# Patient Record
Sex: Female | Born: 1935 | Race: Black or African American | Hispanic: No | Smoking: Never smoker
Health system: Southern US, Community
[De-identification: ages and names within clinical notes are randomized; demographics above are authoritative.]

## PROBLEM LIST (undated history)

## (undated) DIAGNOSIS — E7800 Pure hypercholesterolemia, unspecified: Secondary | ICD-10-CM

## (undated) DIAGNOSIS — R945 Abnormal results of liver function studies: Secondary | ICD-10-CM

## (undated) DIAGNOSIS — G629 Polyneuropathy, unspecified: Secondary | ICD-10-CM

## (undated) DIAGNOSIS — G729 Myopathy, unspecified: Secondary | ICD-10-CM

## (undated) DIAGNOSIS — R7989 Other specified abnormal findings of blood chemistry: Secondary | ICD-10-CM

## (undated) DIAGNOSIS — Z9889 Other specified postprocedural states: Secondary | ICD-10-CM

## (undated) DIAGNOSIS — M1990 Unspecified osteoarthritis, unspecified site: Secondary | ICD-10-CM

## (undated) DIAGNOSIS — M48061 Spinal stenosis, lumbar region without neurogenic claudication: Secondary | ICD-10-CM

## (undated) DIAGNOSIS — I10 Essential (primary) hypertension: Secondary | ICD-10-CM

## (undated) DIAGNOSIS — R112 Nausea with vomiting, unspecified: Secondary | ICD-10-CM

## (undated) DIAGNOSIS — C55 Malignant neoplasm of uterus, part unspecified: Secondary | ICD-10-CM

## (undated) HISTORY — DX: Malignant neoplasm of uterus, part unspecified: C55

## (undated) HISTORY — DX: Other specified abnormal findings of blood chemistry: R79.89

## (undated) HISTORY — DX: Myopathy, unspecified: G72.9

## (undated) HISTORY — DX: Essential (primary) hypertension: I10

## (undated) HISTORY — DX: Unspecified osteoarthritis, unspecified site: M19.90

## (undated) HISTORY — PX: TONSILLECTOMY: SUR1361

## (undated) HISTORY — DX: Spinal stenosis, lumbar region without neurogenic claudication: M48.061

## (undated) HISTORY — DX: Polyneuropathy, unspecified: G62.9

## (undated) HISTORY — DX: Abnormal results of liver function studies: R94.5

## (undated) HISTORY — DX: Pure hypercholesterolemia, unspecified: E78.00

---

## 1971-03-24 HISTORY — PX: CHOLECYSTECTOMY: SHX55

## 1971-03-24 HISTORY — PX: APPENDECTOMY: SHX54

## 2000-08-06 ENCOUNTER — Encounter: Payer: Self-pay | Admitting: Podiatry

## 2000-08-09 ENCOUNTER — Encounter: Payer: Self-pay | Admitting: Podiatry

## 2000-08-09 ENCOUNTER — Ambulatory Visit (HOSPITAL_COMMUNITY): Admission: RE | Admit: 2000-08-09 | Discharge: 2000-08-09 | Payer: Self-pay | Admitting: Podiatry

## 2000-11-11 ENCOUNTER — Ambulatory Visit (HOSPITAL_COMMUNITY): Admission: RE | Admit: 2000-11-11 | Discharge: 2000-11-11 | Payer: Self-pay | Admitting: Family Medicine

## 2000-11-11 ENCOUNTER — Encounter: Payer: Self-pay | Admitting: Family Medicine

## 2000-12-08 ENCOUNTER — Other Ambulatory Visit: Admission: RE | Admit: 2000-12-08 | Discharge: 2000-12-08 | Payer: Self-pay | Admitting: Family Medicine

## 2000-12-20 ENCOUNTER — Ambulatory Visit (HOSPITAL_COMMUNITY): Admission: RE | Admit: 2000-12-20 | Discharge: 2000-12-20 | Payer: Self-pay | Admitting: Podiatry

## 2001-05-26 ENCOUNTER — Encounter: Payer: Self-pay | Admitting: Family Medicine

## 2001-05-26 ENCOUNTER — Ambulatory Visit (HOSPITAL_COMMUNITY): Admission: RE | Admit: 2001-05-26 | Discharge: 2001-05-26 | Payer: Self-pay | Admitting: Family Medicine

## 2001-08-31 ENCOUNTER — Ambulatory Visit (HOSPITAL_COMMUNITY): Admission: RE | Admit: 2001-08-31 | Discharge: 2001-08-31 | Payer: Self-pay | Admitting: General Surgery

## 2002-09-19 ENCOUNTER — Ambulatory Visit (HOSPITAL_COMMUNITY): Admission: RE | Admit: 2002-09-19 | Discharge: 2002-09-19 | Payer: Self-pay | Admitting: Family Medicine

## 2002-09-19 ENCOUNTER — Encounter: Payer: Self-pay | Admitting: Family Medicine

## 2003-08-21 ENCOUNTER — Ambulatory Visit (HOSPITAL_COMMUNITY): Admission: RE | Admit: 2003-08-21 | Discharge: 2003-08-21 | Payer: Self-pay | Admitting: General Surgery

## 2003-10-30 ENCOUNTER — Ambulatory Visit (HOSPITAL_COMMUNITY): Admission: RE | Admit: 2003-10-30 | Discharge: 2003-10-30 | Payer: Self-pay | Admitting: Family Medicine

## 2003-11-13 ENCOUNTER — Ambulatory Visit (HOSPITAL_COMMUNITY): Admission: RE | Admit: 2003-11-13 | Discharge: 2003-11-13 | Payer: Self-pay | Admitting: Family Medicine

## 2005-02-02 ENCOUNTER — Ambulatory Visit (HOSPITAL_COMMUNITY): Admission: RE | Admit: 2005-02-02 | Discharge: 2005-02-02 | Payer: Self-pay | Admitting: Family Medicine

## 2005-03-23 HISTORY — PX: FOOT SURGERY: SHX648

## 2005-12-15 ENCOUNTER — Ambulatory Visit: Payer: Self-pay | Admitting: Family Medicine

## 2005-12-18 ENCOUNTER — Ambulatory Visit (HOSPITAL_COMMUNITY): Admission: RE | Admit: 2005-12-18 | Discharge: 2005-12-18 | Payer: Self-pay | Admitting: Family Medicine

## 2005-12-19 ENCOUNTER — Encounter: Admission: RE | Admit: 2005-12-19 | Discharge: 2005-12-19 | Payer: Self-pay | Admitting: Family Medicine

## 2006-02-08 ENCOUNTER — Ambulatory Visit (HOSPITAL_COMMUNITY): Admission: RE | Admit: 2006-02-08 | Discharge: 2006-02-08 | Payer: Self-pay | Admitting: Family Medicine

## 2006-03-22 ENCOUNTER — Ambulatory Visit: Payer: Self-pay | Admitting: Family Medicine

## 2006-03-22 ENCOUNTER — Encounter: Payer: Self-pay | Admitting: Family Medicine

## 2006-03-22 ENCOUNTER — Other Ambulatory Visit: Admission: RE | Admit: 2006-03-22 | Discharge: 2006-03-22 | Payer: Self-pay | Admitting: Family Medicine

## 2006-03-23 HISTORY — PX: ABDOMINAL HYSTERECTOMY: SHX81

## 2006-08-20 ENCOUNTER — Encounter: Payer: Self-pay | Admitting: Family Medicine

## 2006-08-20 LAB — CONVERTED CEMR LAB
Albumin: 4.1 g/dL (ref 3.5–5.2)
Bilirubin, Direct: 0.1 mg/dL (ref 0.0–0.3)
CO2: 26 meq/L (ref 19–32)
Cholesterol: 254 mg/dL — ABNORMAL HIGH (ref 0–200)
Creatinine, Ser: 0.84 mg/dL (ref 0.40–1.20)
HDL: 77 mg/dL (ref >39)
Indirect Bilirubin: 0.7 mg/dL (ref 0.0–0.9)
LDL Cholesterol: 162 mg/dL — ABNORMAL HIGH (ref 0–99)
Potassium: 3.9 meq/L (ref 3.5–5.3)
Total Bilirubin: 0.8 mg/dL (ref 0.3–1.2)
Total Protein: 7 g/dL (ref 6.0–8.3)
Triglycerides: 73 mg/dL (ref <150)

## 2006-08-22 DIAGNOSIS — C55 Malignant neoplasm of uterus, part unspecified: Secondary | ICD-10-CM

## 2006-08-22 HISTORY — DX: Malignant neoplasm of uterus, part unspecified: C55

## 2006-08-23 ENCOUNTER — Encounter: Payer: Self-pay | Admitting: Family Medicine

## 2006-08-23 LAB — CONVERTED CEMR LAB
HCV Ab: NEGATIVE
Hep B C IgM: NEGATIVE
Hepatitis B Surface Ag: NEGATIVE

## 2006-08-31 ENCOUNTER — Ambulatory Visit: Payer: Self-pay | Admitting: Family Medicine

## 2006-09-06 ENCOUNTER — Ambulatory Visit (HOSPITAL_COMMUNITY): Admission: RE | Admit: 2006-09-06 | Discharge: 2006-09-06 | Payer: Self-pay | Admitting: Family Medicine

## 2006-10-04 ENCOUNTER — Encounter: Payer: Self-pay | Admitting: Family Medicine

## 2006-10-04 LAB — CONVERTED CEMR LAB
AST: 59 units/L — ABNORMAL HIGH (ref 0–37)
Alkaline Phosphatase: 48 units/L (ref 39–117)
Bilirubin, Direct: 0.1 mg/dL (ref 0.0–0.3)
Indirect Bilirubin: 0.5 mg/dL (ref 0.0–0.9)
Total Bilirubin: 0.6 mg/dL (ref 0.3–1.2)

## 2006-10-07 ENCOUNTER — Ambulatory Visit (HOSPITAL_COMMUNITY): Admission: RE | Admit: 2006-10-07 | Discharge: 2006-10-07 | Payer: Self-pay | Admitting: Family Medicine

## 2006-10-15 ENCOUNTER — Ambulatory Visit: Payer: Self-pay | Admitting: Gastroenterology

## 2006-10-21 ENCOUNTER — Ambulatory Visit (HOSPITAL_COMMUNITY): Admission: RE | Admit: 2006-10-21 | Discharge: 2006-10-21 | Payer: Self-pay | Admitting: Family Medicine

## 2006-10-28 ENCOUNTER — Encounter: Admission: RE | Admit: 2006-10-28 | Discharge: 2006-10-28 | Payer: Self-pay | Admitting: Family Medicine

## 2006-11-10 ENCOUNTER — Ambulatory Visit: Admission: RE | Admit: 2006-11-10 | Discharge: 2006-11-10 | Payer: Self-pay | Admitting: Gynecology

## 2006-12-14 ENCOUNTER — Ambulatory Visit (HOSPITAL_COMMUNITY): Admission: RE | Admit: 2006-12-14 | Discharge: 2006-12-15 | Payer: Self-pay | Admitting: Obstetrics & Gynecology

## 2006-12-14 ENCOUNTER — Encounter: Payer: Self-pay | Admitting: Gynecologic Oncology

## 2007-01-05 ENCOUNTER — Ambulatory Visit: Admission: RE | Admit: 2007-01-05 | Discharge: 2007-01-05 | Payer: Self-pay | Admitting: Gynecologic Oncology

## 2007-01-21 ENCOUNTER — Ambulatory Visit: Payer: Self-pay | Admitting: Family Medicine

## 2007-01-31 ENCOUNTER — Encounter: Admission: RE | Admit: 2007-01-31 | Discharge: 2007-01-31 | Payer: Self-pay | Admitting: Internal Medicine

## 2007-02-24 ENCOUNTER — Ambulatory Visit (HOSPITAL_COMMUNITY): Admission: RE | Admit: 2007-02-24 | Discharge: 2007-02-24 | Payer: Self-pay | Admitting: Family Medicine

## 2007-03-24 ENCOUNTER — Encounter: Payer: Self-pay | Admitting: Family Medicine

## 2007-05-04 ENCOUNTER — Ambulatory Visit: Admission: RE | Admit: 2007-05-04 | Discharge: 2007-05-04 | Payer: Self-pay | Admitting: Gynecologic Oncology

## 2007-05-04 ENCOUNTER — Encounter: Payer: Self-pay | Admitting: Family Medicine

## 2007-05-04 ENCOUNTER — Encounter: Payer: Self-pay | Admitting: Gynecologic Oncology

## 2007-05-04 ENCOUNTER — Other Ambulatory Visit: Admission: RE | Admit: 2007-05-04 | Discharge: 2007-05-04 | Payer: Self-pay | Admitting: Gynecologic Oncology

## 2007-05-31 ENCOUNTER — Ambulatory Visit: Payer: Self-pay | Admitting: Family Medicine

## 2007-06-02 ENCOUNTER — Encounter: Payer: Self-pay | Admitting: Family Medicine

## 2007-06-02 LAB — CONVERTED CEMR LAB
Alkaline Phosphatase: 57 units/L (ref 39–117)
Basophils Absolute: 0 10*3/uL (ref 0.0–0.1)
Bilirubin, Direct: 0.2 mg/dL (ref 0.0–0.3)
CO2: 27 meq/L (ref 19–32)
Calcium: 9.4 mg/dL (ref 8.4–10.5)
Cholesterol: 236 mg/dL — ABNORMAL HIGH (ref 0–200)
Eosinophils Relative: 2 % (ref 0–5)
Glucose, Bld: 111 mg/dL — ABNORMAL HIGH (ref 70–99)
HCT: 39.6 % (ref 36.0–46.0)
HDL: 77 mg/dL (ref >39)
Hemoglobin: 13.2 g/dL (ref 12.0–15.0)
Indirect Bilirubin: 0.6 mg/dL (ref 0.0–0.9)
Lymphs Abs: 2.4 10*3/uL (ref 0.7–4.0)
Monocytes Absolute: 0.4 10*3/uL (ref 0.1–1.0)
Neutro Abs: 3.3 10*3/uL (ref 1.7–7.7)
Platelets: 176 10*3/uL (ref 150–400)
RBC: 4.35 M/uL (ref 3.87–5.11)
Total Bilirubin: 0.8 mg/dL (ref 0.3–1.2)
Triglycerides: 79 mg/dL (ref <150)
VLDL: 16 mg/dL (ref 0–40)

## 2007-06-03 ENCOUNTER — Encounter: Payer: Self-pay | Admitting: Family Medicine

## 2007-06-21 ENCOUNTER — Ambulatory Visit: Payer: Self-pay | Admitting: Internal Medicine

## 2007-06-21 ENCOUNTER — Ambulatory Visit (HOSPITAL_COMMUNITY): Admission: RE | Admit: 2007-06-21 | Discharge: 2007-06-21 | Payer: Self-pay | Admitting: Internal Medicine

## 2007-06-21 ENCOUNTER — Encounter: Payer: Self-pay | Admitting: Internal Medicine

## 2007-06-21 HISTORY — PX: COLONOSCOPY: SHX174

## 2007-08-31 ENCOUNTER — Ambulatory Visit: Payer: Self-pay | Admitting: Family Medicine

## 2007-08-31 ENCOUNTER — Other Ambulatory Visit: Admission: RE | Admit: 2007-08-31 | Discharge: 2007-08-31 | Payer: Self-pay | Admitting: Family Medicine

## 2007-08-31 ENCOUNTER — Encounter: Payer: Self-pay | Admitting: Family Medicine

## 2007-08-31 LAB — CONVERTED CEMR LAB
AST: 50 units/L — ABNORMAL HIGH (ref 0–37)
Albumin: 4.1 g/dL (ref 3.5–5.2)
Bilirubin, Direct: 0.1 mg/dL (ref 0.0–0.3)
CO2: 25 meq/L (ref 19–32)
Calcium: 9.7 mg/dL (ref 8.4–10.5)
Cholesterol: 241 mg/dL — ABNORMAL HIGH (ref 0–200)
Creatinine, Ser: 0.79 mg/dL (ref 0.40–1.20)
Indirect Bilirubin: 0.5 mg/dL (ref 0.0–0.9)
Potassium: 4.7 meq/L (ref 3.5–5.3)
Sodium: 143 meq/L (ref 135–145)
Total CHOL/HDL Ratio: 3.1
Total Protein: 7 g/dL (ref 6.0–8.3)
VLDL: 17 mg/dL (ref 0–40)

## 2007-09-08 ENCOUNTER — Encounter: Payer: Self-pay | Admitting: Family Medicine

## 2007-09-08 DIAGNOSIS — E785 Hyperlipidemia, unspecified: Secondary | ICD-10-CM | POA: Insufficient documentation

## 2007-09-08 DIAGNOSIS — I10 Essential (primary) hypertension: Secondary | ICD-10-CM

## 2007-10-07 DIAGNOSIS — Z8669 Personal history of other diseases of the nervous system and sense organs: Secondary | ICD-10-CM | POA: Insufficient documentation

## 2007-11-01 ENCOUNTER — Telehealth: Payer: Self-pay | Admitting: Family Medicine

## 2007-11-02 ENCOUNTER — Telehealth: Payer: Self-pay | Admitting: Family Medicine

## 2007-11-02 ENCOUNTER — Ambulatory Visit: Payer: Self-pay | Admitting: Family Medicine

## 2007-11-02 LAB — CONVERTED CEMR LAB
Bilirubin Urine: NEGATIVE
Blood in Urine, dipstick: NEGATIVE
Glucose, Urine, Semiquant: NEGATIVE
Ketones, urine, test strip: NEGATIVE
Protein, U semiquant: NEGATIVE
Specific Gravity, Urine: 1.01
pH: 6

## 2007-11-03 ENCOUNTER — Encounter: Payer: Self-pay | Admitting: Family Medicine

## 2008-01-11 ENCOUNTER — Other Ambulatory Visit: Admission: RE | Admit: 2008-01-11 | Discharge: 2008-01-11 | Payer: Self-pay | Admitting: Gynecologic Oncology

## 2008-01-11 ENCOUNTER — Encounter: Payer: Self-pay | Admitting: Gynecologic Oncology

## 2008-01-11 ENCOUNTER — Encounter: Payer: Self-pay | Admitting: Family Medicine

## 2008-01-11 ENCOUNTER — Ambulatory Visit: Admission: RE | Admit: 2008-01-11 | Discharge: 2008-01-11 | Payer: Self-pay | Admitting: Gynecologic Oncology

## 2008-01-17 ENCOUNTER — Ambulatory Visit (HOSPITAL_COMMUNITY): Admission: RE | Admit: 2008-01-17 | Discharge: 2008-01-17 | Payer: Self-pay | Admitting: Family Medicine

## 2008-01-17 ENCOUNTER — Ambulatory Visit: Payer: Self-pay | Admitting: Family Medicine

## 2008-01-17 ENCOUNTER — Encounter: Payer: Self-pay | Admitting: Orthopedic Surgery

## 2008-01-17 DIAGNOSIS — M549 Dorsalgia, unspecified: Secondary | ICD-10-CM | POA: Insufficient documentation

## 2008-01-17 DIAGNOSIS — C55 Malignant neoplasm of uterus, part unspecified: Secondary | ICD-10-CM

## 2008-01-18 ENCOUNTER — Encounter: Payer: Self-pay | Admitting: Family Medicine

## 2008-01-25 LAB — CONVERTED CEMR LAB
ALT: 48 units/L — ABNORMAL HIGH (ref 0–35)
Albumin: 3.9 g/dL (ref 3.5–5.2)
Alkaline Phosphatase: 49 units/L (ref 39–117)
BUN: 22 mg/dL (ref 6–23)
Bilirubin, Direct: 0.1 mg/dL (ref 0.0–0.3)
Cholesterol: 216 mg/dL — ABNORMAL HIGH (ref 0–200)
Glucose, Bld: 100 mg/dL — ABNORMAL HIGH (ref 70–99)
HDL: 70 mg/dL (ref >39)
Indirect Bilirubin: 0.6 mg/dL (ref 0.0–0.9)
Total CHOL/HDL Ratio: 3.1
Total Protein: 6.5 g/dL (ref 6.0–8.3)
Triglycerides: 108 mg/dL (ref <150)
VLDL: 22 mg/dL (ref 0–40)

## 2008-02-28 ENCOUNTER — Ambulatory Visit (HOSPITAL_COMMUNITY): Admission: RE | Admit: 2008-02-28 | Discharge: 2008-02-28 | Payer: Self-pay | Admitting: Family Medicine

## 2008-05-21 ENCOUNTER — Encounter: Payer: Self-pay | Admitting: Family Medicine

## 2008-06-12 ENCOUNTER — Encounter: Payer: Self-pay | Admitting: Family Medicine

## 2008-06-12 LAB — CONVERTED CEMR LAB
AST: 52 units/L — ABNORMAL HIGH (ref 0–37)
Alkaline Phosphatase: 56 units/L (ref 39–117)
Bilirubin, Direct: 0.1 mg/dL (ref 0.0–0.3)
HDL: 70 mg/dL (ref >39)
Indirect Bilirubin: 0.5 mg/dL (ref 0.0–0.9)
Total Bilirubin: 0.6 mg/dL (ref 0.3–1.2)
Total CHOL/HDL Ratio: 3.3
Total Protein: 7 g/dL (ref 6.0–8.3)

## 2008-06-21 ENCOUNTER — Ambulatory Visit: Payer: Self-pay | Admitting: Family Medicine

## 2008-06-21 ENCOUNTER — Other Ambulatory Visit: Admission: RE | Admit: 2008-06-21 | Discharge: 2008-06-21 | Payer: Self-pay | Admitting: Family Medicine

## 2008-06-21 ENCOUNTER — Encounter: Payer: Self-pay | Admitting: Family Medicine

## 2008-06-21 LAB — CONVERTED CEMR LAB: OCCULT 1: NEGATIVE

## 2008-06-23 DIAGNOSIS — M25569 Pain in unspecified knee: Secondary | ICD-10-CM | POA: Insufficient documentation

## 2008-06-28 ENCOUNTER — Telehealth: Payer: Self-pay | Admitting: Family Medicine

## 2008-08-14 ENCOUNTER — Telehealth: Payer: Self-pay | Admitting: Family Medicine

## 2009-01-04 ENCOUNTER — Encounter: Payer: Self-pay | Admitting: Family Medicine

## 2009-02-06 ENCOUNTER — Ambulatory Visit: Admission: RE | Admit: 2009-02-06 | Discharge: 2009-02-06 | Payer: Self-pay | Admitting: Gynecologic Oncology

## 2009-02-06 ENCOUNTER — Encounter: Payer: Self-pay | Admitting: Family Medicine

## 2009-02-06 ENCOUNTER — Other Ambulatory Visit: Admission: RE | Admit: 2009-02-06 | Discharge: 2009-02-06 | Payer: Self-pay | Admitting: Gynecologic Oncology

## 2009-02-06 LAB — CONVERTED CEMR LAB
BUN: 20 mg/dL (ref 6–23)
Basophils Relative: 0 % (ref 0–1)
CO2: 26 meq/L (ref 19–32)
Cholesterol: 227 mg/dL — ABNORMAL HIGH (ref 0–200)
Glucose, Bld: 98 mg/dL (ref 70–99)
HCT: 36.8 % (ref 36.0–46.0)
HDL: 69 mg/dL (ref >39)
Hemoglobin: 12 g/dL (ref 12.0–15.0)
LDL Cholesterol: 141 mg/dL — ABNORMAL HIGH (ref 0–99)
Lymphs Abs: 2.4 10*3/uL (ref 0.7–4.0)
MCHC: 32.6 g/dL (ref 30.0–36.0)
MCV: 90.4 fL (ref 78.0–100.0)
Neutrophils Relative %: 50 % (ref 43–77)
Platelets: 182 10*3/uL (ref 150–400)
RBC: 4.07 M/uL (ref 3.87–5.11)
RDW: 13.2 % (ref 11.5–15.5)
Sodium: 141 meq/L (ref 135–145)
Total CHOL/HDL Ratio: 3.3
Triglycerides: 83 mg/dL (ref <150)
VLDL: 17 mg/dL (ref 0–40)
WBC: 5.6 10*3/uL (ref 4.0–10.5)

## 2009-02-08 ENCOUNTER — Encounter: Payer: Self-pay | Admitting: Family Medicine

## 2009-02-11 ENCOUNTER — Ambulatory Visit: Payer: Self-pay | Admitting: Family Medicine

## 2009-02-21 ENCOUNTER — Encounter: Payer: Self-pay | Admitting: Family Medicine

## 2009-02-25 ENCOUNTER — Encounter: Payer: Self-pay | Admitting: Internal Medicine

## 2009-03-04 ENCOUNTER — Ambulatory Visit (HOSPITAL_COMMUNITY): Admission: RE | Admit: 2009-03-04 | Discharge: 2009-03-04 | Payer: Self-pay | Admitting: Family Medicine

## 2009-05-27 LAB — CONVERTED CEMR LAB
AST: 61 units/L — ABNORMAL HIGH (ref 0–37)
Albumin: 4.4 g/dL (ref 3.5–5.2)
Alkaline Phosphatase: 55 units/L (ref 39–117)
Bilirubin, Direct: 0.1 mg/dL (ref 0.0–0.3)
CO2: 27 meq/L (ref 19–32)
Calcium: 9.7 mg/dL (ref 8.4–10.5)
Cholesterol: 234 mg/dL — ABNORMAL HIGH (ref 0–200)
Creatinine, Ser: 0.72 mg/dL (ref 0.40–1.20)
Glucose, Bld: 104 mg/dL — ABNORMAL HIGH (ref 70–99)
Indirect Bilirubin: 0.5 mg/dL (ref 0.0–0.9)
LDL Cholesterol: 144 mg/dL — ABNORMAL HIGH (ref 0–99)
Potassium: 4.2 meq/L (ref 3.5–5.3)
Total Bilirubin: 0.6 mg/dL (ref 0.3–1.2)
Total Protein: 7.3 g/dL (ref 6.0–8.3)
VLDL: 14 mg/dL (ref 0–40)

## 2009-06-03 ENCOUNTER — Ambulatory Visit: Payer: Self-pay | Admitting: Family Medicine

## 2009-06-03 DIAGNOSIS — R7301 Impaired fasting glucose: Secondary | ICD-10-CM | POA: Insufficient documentation

## 2009-06-05 LAB — CONVERTED CEMR LAB: Hgb A1c MFr Bld: 6.5 % — ABNORMAL HIGH (ref 4.6–6.1)

## 2009-06-10 ENCOUNTER — Telehealth: Payer: Self-pay | Admitting: Family Medicine

## 2009-06-14 ENCOUNTER — Encounter: Payer: Self-pay | Admitting: Family Medicine

## 2009-06-18 ENCOUNTER — Telehealth: Payer: Self-pay | Admitting: Family Medicine

## 2009-07-15 DIAGNOSIS — Z862 Personal history of diseases of the blood and blood-forming organs and certain disorders involving the immune mechanism: Secondary | ICD-10-CM | POA: Insufficient documentation

## 2009-07-15 DIAGNOSIS — Z8639 Personal history of other endocrine, nutritional and metabolic disease: Secondary | ICD-10-CM

## 2009-07-18 ENCOUNTER — Encounter: Payer: Self-pay | Admitting: Family Medicine

## 2009-08-12 ENCOUNTER — Other Ambulatory Visit: Admission: RE | Admit: 2009-08-12 | Discharge: 2009-08-12 | Payer: Self-pay | Admitting: Family Medicine

## 2009-08-12 ENCOUNTER — Telehealth: Payer: Self-pay | Admitting: Family Medicine

## 2009-08-12 ENCOUNTER — Ambulatory Visit: Payer: Self-pay | Admitting: Family Medicine

## 2009-08-12 LAB — CONVERTED CEMR LAB: OCCULT 1: NEGATIVE

## 2009-08-18 DIAGNOSIS — R7402 Elevation of levels of lactic acid dehydrogenase (LDH): Secondary | ICD-10-CM | POA: Insufficient documentation

## 2009-08-18 DIAGNOSIS — R740 Nonspecific elevation of levels of transaminase and lactic acid dehydrogenase [LDH]: Secondary | ICD-10-CM

## 2009-08-22 ENCOUNTER — Ambulatory Visit: Payer: Self-pay | Admitting: Internal Medicine

## 2009-08-22 DIAGNOSIS — R609 Edema, unspecified: Secondary | ICD-10-CM | POA: Insufficient documentation

## 2009-08-26 ENCOUNTER — Telehealth: Payer: Self-pay | Admitting: Family Medicine

## 2009-08-27 ENCOUNTER — Ambulatory Visit (HOSPITAL_COMMUNITY): Admission: RE | Admit: 2009-08-27 | Discharge: 2009-08-27 | Payer: Self-pay | Admitting: Family Medicine

## 2009-08-28 ENCOUNTER — Ambulatory Visit: Payer: Self-pay | Admitting: Family Medicine

## 2009-09-04 ENCOUNTER — Ambulatory Visit: Payer: Self-pay | Admitting: Orthopedic Surgery

## 2009-09-04 DIAGNOSIS — M1711 Unilateral primary osteoarthritis, right knee: Secondary | ICD-10-CM

## 2009-09-20 LAB — CONVERTED CEMR LAB
Alkaline Phosphatase: 46 units/L (ref 39–117)
BUN: 22 mg/dL (ref 6–23)
Bilirubin, Direct: 0.1 mg/dL (ref 0.0–0.3)
Chloride: 101 meq/L (ref 96–112)
Creatinine, Ser: 0.86 mg/dL (ref 0.40–1.20)
Glucose, Bld: 100 mg/dL — ABNORMAL HIGH (ref 70–99)
Indirect Bilirubin: 0.6 mg/dL (ref 0.0–0.9)
LDL Cholesterol: 121 mg/dL — ABNORMAL HIGH (ref 0–99)
Potassium: 4 meq/L (ref 3.5–5.3)
VLDL: 18 mg/dL (ref 0–40)
Vit D, 25-Hydroxy: 41 ng/mL (ref 30–89)

## 2009-09-26 ENCOUNTER — Encounter: Payer: Self-pay | Admitting: Gastroenterology

## 2009-09-26 DIAGNOSIS — R7989 Other specified abnormal findings of blood chemistry: Secondary | ICD-10-CM | POA: Insufficient documentation

## 2009-10-02 ENCOUNTER — Ambulatory Visit: Payer: Self-pay | Admitting: Family Medicine

## 2009-10-11 LAB — CONVERTED CEMR LAB
Ferritin: 465 ng/mL — ABNORMAL HIGH (ref 10–291)
Iron: 94 ug/dL (ref 42–145)
Saturation Ratios: 32 % (ref 20–55)
TIBC: 295 ug/dL (ref 250–470)
UIBC: 201 ug/dL

## 2009-10-14 ENCOUNTER — Encounter (INDEPENDENT_AMBULATORY_CARE_PROVIDER_SITE_OTHER): Payer: Self-pay

## 2009-11-05 ENCOUNTER — Ambulatory Visit: Payer: Self-pay | Admitting: Gastroenterology

## 2009-11-05 DIAGNOSIS — K7581 Nonalcoholic steatohepatitis (NASH): Secondary | ICD-10-CM

## 2009-12-06 ENCOUNTER — Ambulatory Visit: Payer: Self-pay | Admitting: Family Medicine

## 2009-12-09 ENCOUNTER — Encounter (INDEPENDENT_AMBULATORY_CARE_PROVIDER_SITE_OTHER): Payer: Self-pay

## 2009-12-09 ENCOUNTER — Encounter: Payer: Self-pay | Admitting: Gastroenterology

## 2009-12-19 ENCOUNTER — Telehealth: Payer: Self-pay | Admitting: Family Medicine

## 2010-01-01 LAB — CONVERTED CEMR LAB
ALT: 46 units/L — ABNORMAL HIGH (ref 0–35)
Albumin: 4.2 g/dL (ref 3.5–5.2)
Cholesterol: 222 mg/dL — ABNORMAL HIGH (ref 0–200)
HDL: 73 mg/dL (ref >39)
Hgb A1c MFr Bld: 6.1 % — ABNORMAL HIGH (ref <5.7)
Indirect Bilirubin: 0.5 mg/dL (ref 0.0–0.9)
Total CHOL/HDL Ratio: 3
Total Protein: 6.8 g/dL (ref 6.0–8.3)
Triglycerides: 110 mg/dL (ref <150)
VLDL: 22 mg/dL (ref 0–40)

## 2010-01-07 ENCOUNTER — Ambulatory Visit: Payer: Self-pay | Admitting: Family Medicine

## 2010-01-12 DIAGNOSIS — E663 Overweight: Secondary | ICD-10-CM

## 2010-01-30 ENCOUNTER — Encounter: Payer: Self-pay | Admitting: Family Medicine

## 2010-01-30 ENCOUNTER — Ambulatory Visit
Admission: RE | Admit: 2010-01-30 | Discharge: 2010-01-30 | Payer: Self-pay | Source: Home / Self Care | Admitting: Gynecologic Oncology

## 2010-03-06 ENCOUNTER — Ambulatory Visit (HOSPITAL_COMMUNITY)
Admission: RE | Admit: 2010-03-06 | Discharge: 2010-03-06 | Payer: Self-pay | Source: Home / Self Care | Attending: Family Medicine | Admitting: Family Medicine

## 2010-03-10 ENCOUNTER — Encounter: Payer: Self-pay | Admitting: Family Medicine

## 2010-03-18 ENCOUNTER — Ambulatory Visit
Admission: RE | Admit: 2010-03-18 | Discharge: 2010-03-18 | Payer: Self-pay | Source: Home / Self Care | Attending: Family Medicine | Admitting: Family Medicine

## 2010-03-18 ENCOUNTER — Telehealth (INDEPENDENT_AMBULATORY_CARE_PROVIDER_SITE_OTHER): Payer: Self-pay | Admitting: *Deleted

## 2010-03-18 LAB — CONVERTED CEMR LAB
Glucose, Urine, Semiquant: NEGATIVE
pH: 6.5

## 2010-03-19 ENCOUNTER — Encounter: Payer: Self-pay | Admitting: Family Medicine

## 2010-04-13 ENCOUNTER — Encounter: Payer: Self-pay | Admitting: Internal Medicine

## 2010-04-13 ENCOUNTER — Encounter: Payer: Self-pay | Admitting: Family Medicine

## 2010-04-22 ENCOUNTER — Telehealth (INDEPENDENT_AMBULATORY_CARE_PROVIDER_SITE_OTHER): Payer: Self-pay | Admitting: *Deleted

## 2010-04-22 DIAGNOSIS — H547 Unspecified visual loss: Secondary | ICD-10-CM | POA: Insufficient documentation

## 2010-04-22 NOTE — Assessment & Plan Note (Signed)
Summary: PHY   Vital Signs:  Patient profile:   77 year old female Menstrual status:  hysterectomy Height:      65 inches Weight:      185.50 pounds BMI:     30.98 O2 Sat:      98 % Pulse rate:   77 / minute Pulse rhythm:   regular Resp:     16 per minute BP sitting:   120 / 80  (left arm) Cuff size:   large  Vitals Entered By: Kate Sable LPN (Aug 13, 7987 2:11 PM)  Nutrition Counseling: Patient's BMI is greater than 25 and therefore counseled on weight management options. CC: CPE    CC:  CPE .  History of Present Illness: Reports  that she has been doing well. Denies recent fever or chills. Denies sinus pressure, nasal congestion , ear pain or sore throat. Denies chest congestion, or cough productive of sputum. Denies chest pain, palpitations, PND, orthopnea or leg swelling. Denies abdominal pain, nausea, vomitting, diarrhea or constipation.she does have fatty liver history, has not seen gI for over 1 yr and has abn labs Denies change in bowel movements or bloody stool. Denies dysuria , frequency, incontinence or hesitancy. c/o increasing right knee pain and instability, needs ortho eval. Denies headaches, vertigo, seizures. Denies depression, anxiety or insomnia. Denies  rash, lesions, or itch. she has been diligent with diet and physical activity in an attempt to control her blood sugars without meds     Current Medications (verified): 1)  Maxzide 75-50 Mg  Tabs (Triamterene-Hctz) .... Take One Tablet By Mouth Once A Day 2)  Cvs Natural Fish Oil 1000 Mg Caps (Omega-3 Fatty Acids) .... Take One Tab By Mouth Once Daily 3)  Womens Multivitamin Plus  Tabs (Multiple Vitamins-Minerals) .... Take One Tab By Mouth Once Daily 4)  Adult Aspirin Ec Low Strength 81 Mg Tbec (Aspirin) .... Take One Tab By Mouth Once Daily 5)  Vitamin D (Ergocalciferol) 50000 Unit Caps (Ergocalciferol) .... One Tablet Once Weekly 6)  Zetia 10 Mg Tabs (Ezetimibe) .... Take 1 Tab By Mouth At  Bedtime 7)  Metformin Hcl 500 Mg Tabs (Metformin Hcl) .... One Tab By Mouth Once Daily 8)  Accu-Chek Aviva  Strp (Glucose Blood) .... Once Daily Testing ( Dx:250.00) 9)  Accu-Chek Multiclix Lancets  Misc (Lancets) .... Once Daily Testing (Dx:250.00)10  Allergies (verified): 1)  ! Pcn  Past History:  Past Medical History: Current Problems:  RESTLESS LEG SYNDROME, HX OF (ICD-V12.49) HYPERTENSION (ICD-401.9) HYPERLIPIDEMIA (ICD-272.4) uterine cancer 2008 Type 2 DM 2011 obesity 2011  Past Surgical History: Appendectomy  1973 Cholecystectomy 1973 Tonsillectomy  age 5 Removal of benign mass from both cheeks in 2005 TAH-BSO- 12/14/2006 Left bunionectomy 2006  Review of Systems      See HPI General:  Denies chills and fever. Eyes:  Denies blurring, discharge, and red eye. GI:  fatty liver , needs gI re-eval. MS:  Complains of joint pain and stiffness; progressive right knee pain with reduced ROM and potential instability progressing in the past 4 months. Psych:  Denies anxiety and depression. Endo:  Denies cold intolerance, excessive hunger, excessive thirst, excessive urination, heat intolerance, polyuria, and weight change; daily fastimng blood sugars generally99 to 113. Heme:  Denies abnormal bruising and bleeding. Allergy:  Complains of seasonal allergies; denies hives or rash and itching eyes; mild.  Physical Exam  General:  Well-developed,well-nourished,in no acute distress; alert,appropriate and cooperative throughout examination Head:  Normocephalic and atraumatic without obvious abnormalities. No  apparent alopecia or balding. Eyes:  No corneal or conjunctival inflammation noted. EOMI. Perrla. Funduscopic exam benign, without hemorrhages, exudates or papilledema. Vision grossly normal. Ears:  External ear exam shows no significant lesions or deformities.  Otoscopic examination reveals clear canals, tympanic membranes are intact bilaterally without bulging, retraction,  inflammation or discharge. Hearing is grossly normal bilaterally. Nose:  External nasal examination shows no deformity or inflammation. Nasal mucosa are pink and moist without lesions or exudates. Mouth:  Oral mucosa and oropharynx without lesions or exudates.  Teeth in good repair. Neck:  No deformities, masses, or tenderness noted. Chest Wall:  No deformities, masses, or tenderness noted. Breasts:  No mass, nodules, thickening, tenderness, bulging, retraction, inflamation, nipple discharge or skin changes noted.   Lungs:  Normal respiratory effort, chest expands symmetrically. Lungs are clear to auscultation, no crackles or wheezes. Heart:  Normal rate and regular rhythm. S1 and S2 normal without gallop, murmur, click, rub or other extra sounds. Abdomen:  Bowel sounds positive,abdomen soft and non-tender without masses, organomegaly or hernias noted. Rectal:  No external abnormalities noted. Normal sphincter tone. No rectal masses or tenderness.Guaic neg stool Genitalia:  normal introitus, no external lesions, no vaginal discharge, mucosa pink and moist, no vaginal or cervical lesions, and no adnexal masses or tenderness.  uterus absent Msk:  No deformity or scoliosis noted of thoracic or lumbar spine.   Pulses:  R and L carotid,radial,femoral,dorsalis pedis and posterior tibial pulses are full and equal bilaterally Extremities:  No clubbing, cyanosis, edema, or deformity noted with normal full range of motion of all joints except for the right knee.   Neurologic:  No cranial nerve deficits noted. Station and gait are normal. Plantar reflexes are down-going bilaterally. DTRs are symmetrical throughout. Sensory, motor and coordinative functions appear intact. Skin:  Intact without suspicious lesions or rashes Cervical Nodes:  No lymphadenopathy noted Axillary Nodes:  No palpable lymphadenopathy Inguinal Nodes:  No significant adenopathy Psych:  Cognition and judgment appear intact. Alert and  cooperative with normal attention span and concentration. No apparent delusions, illusions, hallucinations   Impression & Recommendations:  Problem # 1:  IMPAIRED FASTING GLUCOSE (ICD-790.21) Assessment Comment Only  Her updated medication list for this problem includes:    Metformin Hcl 500 Mg Tabs (Metformin hcl) ..... One tab by mouth once daily  Orders: T- Hemoglobin A1C ([id number])  Labs Reviewed: Creat: 0.72 (05/22/2009)     Problem # 2:  KNEE PAIN, RIGHT, CHRONIC (ICD-719.46) Assessment: Deteriorated  Her updated medication list for this problem includes:    Adult Aspirin Ec Low Strength 81 Mg Tbec (Aspirin) .Marland Kitchen... Take one tab by mouth once daily ortho referral  Orders: Orthopedic Surgeon Referral (Ortho Surgeon)  Problem # 3:  HYPERTENSION (ICD-401.9) Assessment: Unchanged  Her updated medication list for this problem includes:    Maxzide 75-50 Mg Tabs (Triamterene-hctz) .Marland Kitchen... Take one tablet by mouth once a day  Orders: T-Basic Metabolic Panel ([id number]) Radiology Referral (Radiology)  BP today: 120/80 Prior BP: 122/74 (06/03/2009)  Labs Reviewed: K+: 4.2 (05/22/2009) Creat: : 0.72 (05/22/2009)   Chol: 234 (05/22/2009)   HDL: 76 (05/22/2009)   LDL: 144 (05/22/2009)   TG: 69 (05/22/2009)  Problem # 4:  HYPERLIPIDEMIA (ICD-272.4) Assessment: Unchanged  Her updated medication list for this problem includes:    Zetia 10 Mg Tabs (Ezetimibe) .Marland Kitchen... Take 1 tab by mouth at bedtime  Orders: T-Hepatic Function [phone]) T-Lipid Profile [phone])  Labs Reviewed: SGOT: 61 (05/22/2009)   SGPT: 48 (05/22/2009)  HDL:76 (05/22/2009), 69 (02/05/2009)  LDL:144 (05/22/2009), 141 (02/05/2009)  Chol:234 (05/22/2009), 227 (02/05/2009)  Trig:69 (05/22/2009), 83 (02/05/2009)  Problem # 5:  TRANSAMINASES, SERUM, ELEVATED (ICD-790.4) Assessment: Deteriorated  [place] of RUQ , and GI re-eval  Orders: Radiology Referral (Radiology) Gastroenterology Referral  (GI)  Complete Medication List: 1)  Maxzide 75-50 Mg Tabs (Triamterene-hctz) .... Take one tablet by mouth once a day 2)  Cvs Natural Fish Oil 1000 Mg Caps (Omega-3 fatty acids) .... Take one tab by mouth once daily 3)  Womens Multivitamin Plus Tabs (Multiple vitamins-minerals) .... Take one tab by mouth once daily 4)  Adult Aspirin Ec Low Strength 81 Mg Tbec (Aspirin) .... Take one tab by mouth once daily 5)  Vitamin D (ergocalciferol) 50000 Unit Caps (Ergocalciferol) .... One tablet once weekly 6)  Zetia 10 Mg Tabs (Ezetimibe) .... Take 1 tab by mouth at bedtime 7)  Metformin Hcl 500 Mg Tabs (Metformin hcl) .... One tab by mouth once daily 8)  Accu-chek Aviva Strp (Glucose blood) .... Once daily testing ( dx:250.00) 9)  Accu-chek Multiclix Lancets Misc (Lancets) .... Once daily testing (dx:250.00)10  Other Orders: Pelvic & Breast Exam ( Medicare)  (G0101) Hemoccult Guaiac-1 spec.(in office) (82270) T-Vitamin D (25-Hydroxy) ([id number])  Patient Instructions: 1)  F/u in october. 2)  You will be referred to [name] about your right knee pain and instability. 3)  You will be referred to Dr Sydell Axon f/u fatty liver. 4)  You will be referred for an ultrasound of your liver and kidneys 5)  BMP prior to visit, ICD-9: 6)  Hepatic Panel prior to visit, ICD-9:  fasting end June 7)  Lipid Panel prior to visit, ICD-9:   8)  HbgA1C prior to visit, ICD-9: 9)  Vit D Prescriptions: MAXZIDE 75-50 MG  TABS (TRIAMTERENE-HCTZ) Take one tablet by mouth once a day  #30 Each x 4   Entered by:   Kate Sable LPN   Authorized by:   [name] MD   Signed by:   Kate Sable LPN on 55/97/4163   Method used:   Electronically to        [facility]  [address] (retail)       [address] [place]       [place], [place]  [address]       Ph: [phone]       Fax: [phone]   RxID:   [id number]   Laboratory Results    Stool - Occult Blood Hemmoccult #1: negative Date:  08/12/2009 Comments: [id number] 9R 8/11 118/10/12

## 2010-04-22 NOTE — Miscellaneous (Signed)
Summary: Orders Update  Clinical Lists Changes  Orders: Added new Test order of T-Hepatic Function ([id number]) - Signed 

## 2010-04-22 NOTE — Assessment & Plan Note (Signed)
Summary: flu shot  Nurse Visit   Allergies: 1)  ! Pcn  Immunizations Administered:  Influenza Vaccine # 1:    Vaccine Type: Fluvax Non-MCR    Site: left deltoid    Mfr: novartis    Dose: 0.5 ml    Route: IM    Given by: Baldomero Lamy LPN    Exp. Date: 07/2010    Lot #: [id number]    VIS given: 10/15/09 version given December 06, 2009.  Orders Added: 1)  Influenza Vaccine NON MCR [04599]

## 2010-04-22 NOTE — Progress Notes (Signed)
  Phone Note Call from Patient   Caller: Patient Summary of Call: Patient came in today for her diabetic teaching. Patient was very receptive of all information provided, however, she feels very strongly against starting the metformin immediately. Patient is requesting to try diet and exercise for the first 3 months to see how her sugar does.  Her A1C was a 6.5, how do you feel about this? Initial call taken by: Baldomero Lamy LPN,  June 10, 5033 10:21 AM  Follow-up for Phone Call        advise her ok, but she needs to test regularly, by the new standards she is diabetic. Follow-up by: [name] MD,  June 12, 2009 7:56 PM  Additional Follow-up for Phone Call Additional follow up Details #1::        Phone Call Completed Additional Follow-up by: Baldomero Lamy LPN,  June 14, 4654 8:10 AM

## 2010-04-22 NOTE — Letter (Signed)
Summary: Recall, Labs Needed  [facility]  [address]   [place], [place] [address]   Phone: [phone]  Fax: [phone]    December 09, 2009  [address] [place], [place]  [address] 09/19/1932   Dear Ms. Schoeneck,   Our records indicate it is time to repeat your blood work.  You can take the enclosed form to the lab on or near the date indicated.  Please make note of the new location of the lab:   [place], 2nd floor   [place] office will call you within a week to ten business days with the results.  If you do not hear from [place] in 10 business days, you should call the office.  If you have any questions regarding this, call the office at [phone], and ask for the nurse.  Labs are due on 01/01/2010.   Sincerely,    Burnadette Peter LPN  [facility] Ph: [phone]   Fax: [phone]

## 2010-04-22 NOTE — Assessment & Plan Note (Signed)
Summary: EVAL/TREAT RT KNEE PAIN/NEED XRAY/REF [facility]/[employer] MEDICARE...   Vital Signs:  Patient profile:   77 year old female Menstrual status:  hysterectomy Height:      66 inches Weight:      185 pounds Pulse rate:   68 / minute Resp:     16 per minute  Vitals Entered By: Arther Abbott MD (September 04, 2009 8:42 AM)  Visit Type:  new patient Referring Provider:  Moshe Cipro Primary Provider:  Moshe Cipro  CC:  right knee pain.  History of Present Illness: I saw [name] [name] in the office today for an initial visit.  She is a 77 years old woman with the complaint of:  right knee pain.  Xrays today right knee.  Xrays L spine 2009 for review.  Meds: EMR.  She complains of pain in her RIGHT knee for approximately one year denies any injury. She gets a feeling that the kneecap is moving out of place and she has some swelling and stiffness. No catching or locking. She does note pain when she climbs the steps.  No medications at present, related to the knee.  Current medications include Zetia, vitamin D, Fisher, aspirin, multivitamin, Maxide.      Allergies: 1)  ! Pcn  Family History: Mother deceased at 29- brain tumor Father deceased 41 sisters- 1 diabetic, 1 with breast cancer, 1 died at age 28 of brain aneurysm 2 brothers living- 1 with prostate cancer No FH of CRC. FH of Cancer:  Family History of Diabetes Family History of Arthritis  Social History: Retired from [employer] Married Never Smoked Alcohol use-no Drug use-no caffeine use daily Regular exercise-no 3 adult sons  Review of Systems Respiratory:  Complains of snoring; denies short of breath, wheezing, couch, tightness, pain on inspiration, and snoring . Gastrointestinal:  Complains of constipation; denies heartburn, nausea, vomiting, diarrhea, and blood in your stools. Genitourinary:  Complains of frequency; denies urgency, difficulty urinating, painful urination, flank pain, and bleeding in  urine. Musculoskeletal:  Complains of swelling, instability, stiffness, and muscle pain; denies joint pain, redness, and heat. Psychiatric:  Complains of nervousness; denies depression, anxiety, and hallucinations. HEENT:  Complains of eye pain; denies blurred or double vision, redness, and watering.  The review of systems is negative for Constitutional, Cardiovascular, Neurologic, Endocrine, Skin, Immunology, and Hemoatologic.  Physical Exam  Additional Exam:  The patient is well developed and nourished, with normal grooming and hygiene. The body habitus is medium  The pulses and perfusion were normal with normal color, temperature  and no swelling  The coordination and sensation were normal  The reflexes were normal   Skin normal  Mild varus alignment RIGHT knee, medial and lateral joint lines nontender. Crepitance and pain with patellofemoral compression. Patellofemoral joint, stable.  Motor exam normal. He was stable. Muscle strength and muscle tone normal.  lymph exam normal   gait normal      Impression & Recommendations:  Problem # 1:  KNEE, ARTHRITIS, DEGEN./OSTEO (ICD-715.96) Assessment New  Her updated medication list for this problem includes:    Adult Aspirin Ec Low Strength 81 Mg Tbec (Aspirin) .Marland Kitchen... Take one tab by mouth once daily  Orders: New Patient Level III (06269) Depo- Medrol 59m (J1030) Joint Aspirate / Injection, Large (20610)/rt knee  intra-articular injection RIGHT knee  Verbal consent was obtained. The knee was prepped with alcohol and ethyl chloride. 1 cc of depomedrol 467mcc and 4 cc of lidocaine 1% was injected. there were no complications.    Knee x-ray,  3 views (73562)/RIGHT knee.  There is mild varus alignment to the RIGHT knee. There appears to be general joint disease as well. Her compartment. All of the medial compartment is more narrowed. There is also patellofemoral joint space narrowing with normal patellar  alignment.  Impression osteoarthritis, RIGHT knee.  Other Orders: Physical Therapy Referral (PT)  Patient Instructions: 1)  You have received an injection of cortisone today. You may experience increased pain at the injection site. Apply ice pack to the area for 20 minutes every 2 hours and take 2 xtra strength tylenol every 8 hours. This increased pain will usually resolve in 24 hours. The injection will take effect in 3-10 days.   2)  Physical therapy  3)  Please schedule a follow-up appointment as needed.

## 2010-04-22 NOTE — Assessment & Plan Note (Signed)
Summary: F UP   Vital Signs:  Patient profile:   77 year old female Menstrual status:  hysterectomy Height:      66 inches Weight:      184.25 pounds BMI:     29.85 O2 Sat:      96 % on Room air Pulse rate:   84 / minute Pulse rhythm:   regular Resp:     16 per minute BP sitting:   130 / 70  (left arm) Cuff size:   large  Vitals Entered By: Louie Casa CMA (January 07, 2010 8:10 AM)  Nutrition Counseling: Patient's BMI is greater than 25 and therefore counseled on weight management options.  O2 Flow:  Room air CC: follow up   Primary Care Provider:  Moshe Cipro, M.D.  CC:  follow up.  History of Present Illness: Reports  that she has been  doing well.She has no new concerns. Denies recent fever or chills. Denies sinus pressure, nasal congestion , ear pain or sore throat. Denies chest congestion, or cough productive of sputum. Denies chest pain, palpitations, PND, orthopnea or leg swelling. Denies abdominal pain, nausea, vomitting, diarrhea or constipation. Denies change in bowel movements or bloody stool. Denies dysuria , frequency, incontinence or hesitancy.  Denies headaches, vertigo, seizures. Denies depression, anxiety or insomnia. Denies  rash, lesions, or itch. She has worked hard at [employer] change for weight loss and blood sugar control, with excellent results.     Allergies (verified): 1)  ! Pcn  Review of Systems      See HPI Eyes:  Denies blurring, discharge, eye pain, and red eye. MS:  Complains of low back pain and mid back pain; increased in the past 1 week. Endo:  Denies cold intolerance, excessive thirst, excessive urination, heat intolerance, and polyuria. Heme:  Denies abnormal bruising and bleeding. Allergy:  Denies hives or rash and itching eyes.  Physical Exam  General:  Well-developed,well-nourished,in no acute distress; alert,appropriate and cooperative throughout examination HEENT: No facial asymmetry,  EOMI, No sinus tenderness, TM's  Clear, oropharynx  pink and moist.   Chest: Clear to auscultation bilaterally.  CVS: S1, S2, No murmurs, No S3.   Abd: Soft, Nontender.  MS: decreased  ROM spine,adequate in  hips, shoulders and reduced in  knees.  Ext: No edema.   CNS: CN 2-12 intact, power tone and sensation normal throughout.   Skin: Intact, no visible lesions or rashes.  Psych: Good eye contact, normal affect.  Memory intact, not anxious or depressed appearing.    Impression & Recommendations:  Problem # 1:  HYPERTENSION (ICD-401.9) Assessment Unchanged  Her updated medication list for this problem includes:    Maxzide 75-50 Mg Tabs (Triamterene-hctz) .Marland Kitchen... Take 1 tablet by mouth once a day Patient advised to follow low sodium diet rich in fruit and vegetables, and to commit to at least 30 minutes 5 days per week of regular exercise , to improve blood presure control.   Orders: T-Basic Metabolic Panel ([id number])  BP today: 130/70 Prior BP: 130/70 (11/05/2009)  Labs Reviewed: K+: 4.0 (09/19/2009) Creat: : 0.86 (09/19/2009)   Chol: 222 (01/01/2010)   HDL: 73 (01/01/2010)   LDL: 127 (01/01/2010)   TG: 110 (01/01/2010)  Problem # 2:  OVERWEIGHT (ICD-278.02) Assessment: Improved  Ht: 66 (01/07/2010)   Wt: 184.25 (01/07/2010)   BMI: 29.85 (01/07/2010)  Problem # 3:  BACK PAIN (ICD-724.5) Assessment: Deteriorated  Her updated medication list for this problem includes:    Adult Aspirin Ec Low Strength 81  Mg Tbec (Aspirin) .Marland Kitchen... Take one tab by mouth once daily  Orders: Depo- Medrol 32m (J1040) Ketorolac-Toradol 134m(J(I7782Admin of Therapeutic Inj  intramuscular or subcutaneous (9(42353Medicare Electronic Prescription (G(I1443 Problem # 4:  IMPAIRED FASTING GLUCOSE (ICD-790.21) Assessment: Improved  Orders: T- Hemoglobin A1C ([id number] Labs Reviewed: Creat: 0.86 (09/19/2009)    Pt advised to reduce carbohydrate intake, espescially sweets, and to start regular physical activity, at least 30  minutes 5 days weekly, to enable weight loss, and reduce the risk of becoming diabetic   Complete Medication List: 1)  Cvs Natural Fish Oil 1000 Mg Caps (Omega-3 fatty acids) .... Take one tab by mouth twice daily 2)  Adult Aspirin Ec Low Strength 81 Mg Tbec (Aspirin) .... Take one tab by mouth once daily 3)  Zetia 10 Mg Tabs (Ezetimibe) .... Take 1 tab by mouth at bedtime 4)  Accu-chek Aviva Strp (Glucose blood) .... Once daily testing ( dx:250.00) 5)  Accu-chek Multiclix Lancets Misc (Lancets) .... Once daily testing (dx:250.00)10 6)  Maxzide 75-50 Mg Tabs (Triamterene-hctz) .... Take 1 tablet by mouth once a day 7)  Womens Multivitamin Plus Tabs  .... One half tablet twice daily 8)  Prednisone (pak) 5 Mg Tabs (Prednisone) .... Use as directed  Other Orders: T-Lipid Profile ([phone]-Hepatic Function ([phone]-CBC w/Diff ([phone]-TSH ([phone] Referral (Radiology)  Patient Instructions: 1)  Please schedule a follow-up appointment in 4 to 4.5 months. 2)  It is important that you exercise regularly at least 20 minutes 5 times a week. If you develop chest pain, have severe difficulty breathing, or feel very tired , stop exercising immediately and seek medical attention. 3)  You need to lose weight. Consider a lower calorie diet and regular exercise.  4)  BMP prior to visit, ICD-9: 5)  Hepatic Panel prior to visit, ICD-9: 6)  Lipid Panel prior to visit, ICD-9: 7)  HbgA1C prior to visit, ICD-9:   fasting in 4 to 4.5 months 8)  TSH prior to visit, ICD-9: 9)  CBC w/ Diff prior to visit, ICD-9: 10)  Congratys on weight loss and improved blood sugars, you are not diabetic, but absolutely need to keep the healthylifestyle up so you do not become diabetic. 11)  Injections today and med for back pain, I agree with chiropracter, pls call if symptoms worseen. 12)  pLS do not refill the vit D 13)  The medication list was reviewed and reconciled..All changed/newly  prescribed medications were explained. A complete medication list was provided to the patient/caregiver.  Prescriptions: PREDNISONE (PAK) 5 MG TABS (PREDNISONE) Use as directed  #21 x 0   Entered and Authorized by:   MaTula NakayamaD   Signed by:   MaTula NakayamaD on 01/07/2010   Method used:   Electronically to        [facility]       [address]     [place]  [address]     Ph: [phone]     Fax: [phone] RxID:   [id number] 800 MG TABS (IBUPROFEN) Take 1 tablet by mouth three times a day for 1 week , then 1 daily as needed for back pain  #60 x 0   Entered and Authorized by:   MaTula NakayamaD   Signed by:   MaTula NakayamaD on 01/07/2010   Method used:  Electronically to        [facility] (retail)       [address]       [place], [place]  [address]       Ph: [phone]       Fax: [phone]   RxID:   [id number]    Medication Administration  Injection # 1:    Medication: Depo- Medrol 67m    Diagnosis: BACK PAIN (ICD-724.5)    Route: IM    Site: RUOQ gluteus    Exp Date: 06/12    Lot #: [id number]    Mfr: Pharmacia    Patient tolerated injection without complications    Given by: JBaldomero LamyLPN (October 18, 275648:58 AM)  Injection # 2:    Medication: Ketorolac-Toradol 129m   Diagnosis: BACK PAIN (ICD-724.5)    Route: IM    Site: LUOQ gluteus    Exp Date: 01/22/2011    Lot #: [id number]  Mfr: novaplus    Comments: toradol 6079miven    Patient tolerated injection without complications    Given by: JaiBaldomero LamyN (October 18, 201841659 AM)  Orders Added: 1)  Est. Patient Level IV [99214] 2)  T-Basic Metabolic Panel [80[60630-16010]  T-Lipid Profile [80061-22930] 4)  T-Hepatic Function [80076-22960] 5)  T- Hemoglobin A1C [83036-23375] 6)  T-CBC w/Diff [85[93235-57322]  T-TSH [84[02542-70623]  Depo- Medrol 24m33m1040] 9)   Ketorolac-Toradol 15mg42m885] 10)  Admin of Therapeutic Inj  intramuscular or subcutaneous [96372] 11)  Radiology Referral [Radiology] 12)  Medicare Electronic Prescription [G855[J6283]Medication Administration  Injection # 1:    Medication: Depo- Medrol 24mg 24miagnosis: BACK PAIN (ICD-724.5)    Route: IM    Site: RUOQ gluteus    Exp Date: 06/12    Lot #: [id number]    Mfr: Pharmacia    Patient tolerated injection without complications    Given by: Jaime Baldomero LamyOctober 18, 2011 81517AM)  Injection # 2:    Medication: Ketorolac-Toradol 15mg  14magnosis: BACK PAIN (ICD-724.5)    Route: IM    Site: LUOQ gluteus    Exp Date: 01/22/2011    Lot #: [id number]: novaplus    Comments: toradol 60mg gi81m   Patient tolerated injection without complications    Given by: Jaime BoBaldomero Lamytober 18, 2011 8:51062)  Orders Added: 1)  Est. Patient Level IV [99214] [69485]asic Metabolic Panel [80048-2[46270-35009]ipid Profile [80061-22930] 4)  T-Hepatic Function [80076-22960] 5)  T- Hemoglobin A1C [83036-23375] 6)  T-CBC w/Diff [85025-1[38182-99371]SH [84443-2[69678-93810]o- Medrol 24mg [J131m 9)  Ketorolac-Toradol 15mg [J1812m10)  Admin of Therapeutic Inj  intramuscular or subcutaneous [96372] 11)  Radiology Referral [Radiology] 12)  Medicare Electronic Prescription [[phone]

## 2010-04-22 NOTE — Letter (Signed)
Summary: TRANSFERRED RECORDS  TRANSFERRED RECORDS   Imported By: Dierdre Harness 12/18/2009 08:50:31  _____________________________________________________________________  External Attachment:    Type:   Image     Comment:   External Document

## 2010-04-22 NOTE — Assessment & Plan Note (Signed)
Summary: OV   Vital Signs:  Patient profile:   77 year old female Menstrual status:  hysterectomy Height:      65 inches Weight:      189 pounds BMI:     31.56 O2 Sat:      95 % Pulse rate:   91 / minute Pulse rhythm:   regular Resp:     16 per minute BP sitting:   122 / 74  (left arm) Cuff size:   large  Vitals Entered By: Kate Sable LPN (June 04, 7122 5:80 AM)  Nutrition Counseling: Patient's BMI is greater than 25 and therefore counseled on weight management options. CC: Follow up chronic problems Is Patient Diabetic? No Pain Assessment Patient in pain? no        CC:  Follow up chronic problems.  History of Present Illness: Reports  thatshe has been doing well. Denies recent fever or chills. Denies sinus pressure, nasal congestion , ear pain or sore throat. Denies chest congestion, or cough productive of sputum. Denies chest pain, palpitations, PND, orthopnea or leg swelling. Denies abdominal pain, nausea, vomitting, diarrhea or constipation. Denies change in bowel movements or bloody stool. Denies dysuria , frequency, incontinence or hesitancy. Denies  joint pain, swelling, or reduced mobility. Denies headaches, vertigo, seizures. Denies depression, anxiety or insomnia. Denies  rash, lesions, or itch. She has not been exercising ,and has gained weight , she plans to reverse this.     Current Medications (verified): 1)  Maxzide 75-50 Mg  Tabs (Triamterene-Hctz) .... Take One Tablet By Mouth Once A Day 2)  Cvs Natural Fish Oil 1000 Mg Caps (Omega-3 Fatty Acids) .... Take One Tab By Mouth Once Daily 3)  Womens Multivitamin Plus  Tabs (Multiple Vitamins-Minerals) .... Take One Tab By Mouth Once Daily 4)  Adult Aspirin Ec Low Strength 81 Mg Tbec (Aspirin) .... Take One Tab By Mouth Once Daily  Allergies (verified): 1)  ! Pcn  Review of Systems      See HPI Eyes:  Denies vision loss-both eyes; small cataract  in one eye ? left based on exam done  04/2009. MS:  Complains of joint pain and stiffness; intermittent right knee pain and stiffness. Endo:  Denies cold intolerance, excessive hunger, excessive thirst, excessive urination, heat intolerance, polyuria, and weight change. Heme:  Denies abnormal bruising and bleeding. Allergy:  Denies hives or rash and sneezing.  Physical Exam  General:  Well-developedoverweight,in no acute distress; alert,appropriate and cooperative throughout examination HEENT: No facial asymmetry,  EOMI, No sinus tenderness, TM's Clear, oropharynx  pink and moist.   Chest: Clear to auscultation bilaterally.  CVS: S1, S2, No murmurs, No S3.   Abd: Soft, Nontender.  MS: Adequate ROM spine, hips, shoulders and reduced in knees.  Ext: No edema.   CNS: CN 2-12 intact, power tone and sensation normal throughout.   Skin: Intact, no visible lesions or rashes.  Psych: Good eye contact, normal affect.  Memory intact, not anxious or depressed appearing.    Impression & Recommendations:  Problem # 1:  IMPAIRED FASTING GLUCOSE (ICD-790.21) Assessment Comment Only  Orders: T- Hemoglobin A1C (99833-82505), weight loss, low carb diet and exercise encouraged  Problem # 2:  KNEE PAIN, RIGHT, CHRONIC (ICD-719.46) Assessment: Unchanged  Her updated medication list for this problem includes:    Adult Aspirin Ec Low Strength 81 Mg Tbec (Aspirin) .Marland Kitchen... 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 Take one tablet by mouth once a day  BP today: 122/74 Prior BP: 130/80 (02/11/2009)  Labs Reviewed: K+: 4.2 (05/22/2009) Creat: : 0.72 (05/22/2009)   Chol: 234 (05/22/2009)   HDL: 76 (05/22/2009)   LDL: 144 (05/22/2009)   TG: 69  (05/22/2009)  Problem # 5:  HYPERLIPIDEMIA (ICD-272.4) Assessment: Deteriorated  The following medications were removed from the medication list:    Zetia 10 Mg Tabs (Ezetimibe) .Marland Kitchen... Take 1 tab by mouth at bedtime Her updated medication list for this problem includes:    Zetia 10 Mg Tabs (Ezetimibe) .Marland Kitchen... Take 1 tab by mouth at bedtime  Orders: T-Hepatic Function [phone]) T-Lipid Profile [phone])  Labs Reviewed: SGOT: 61 (05/22/2009)   SGPT: 48 (05/22/2009)   HDL:76 (05/22/2009), 69 (02/05/2009)  LDL:144 (05/22/2009), 141 (02/05/2009)  Chol:234 (05/22/2009), 227 (02/05/2009)  Trig:69 (05/22/2009), 83 (02/05/2009)  Complete Medication List: 1)  Maxzide 75-50 Mg Tabs (Triamterene-hctz) .... Take one tablet by mouth once a day 2)  Cvs Natural Fish Oil 1000 Mg Caps (Omega-3 fatty acids) .... Take one tab by mouth once daily 3)  Womens Multivitamin Plus Tabs (Multiple vitamins-minerals) .... Take one tab by mouth once daily 4)  Adult Aspirin Ec Low Strength 81 Mg Tbec (Aspirin) .... Take one tab by mouth once daily 5)  Vitamin D (ergocalciferol) 50000 Unit Caps (Ergocalciferol) .... One tablet once weekly 6)  Zetia 10 Mg Tabs (Ezetimibe) .... Take 1 tab by mouth at bedtime  Patient Instructions: 1)  CPE  May 20 or after. 2)    3)  It is important that you exercise regularly at least 20 minutes 5 times a week. If you develop chest pain, have severe difficulty breathing, or feel very tired , stop exercising immediately and seek medical attention. 4)  You need to lose weight. Consider a lower calorie diet and regular exercise. Goal is 4 pounds in the next 2 months. 5)  Pls start vit D supplement once weekly 6)  HbgA1C prior to visit, ICD-9: today 7)  Hepatic Panel prior to visit, ICD-9: 8)  Lipid Panel prior to visit, ICD-9:  fasting  in 3 months Prescriptions: ZETIA 10 MG TABS (EZETIMIBE) Take 1 tab by mouth at bedtime  #30 x 3   Entered and Authorized by:   [name]  MD   Signed by:   [name] MD on 06/03/2009   Method used:   Electronically to        [facility] (retail)       [address]       [place], [place]  [address]       Ph: [phone]       Fax: [phone]   RxID:   [id number] VITAMIN D (ERGOCALCIFEROL) 50000 UNIT CAPS (ERGOCALCIFEROL) one tablet once weekly  #4 x 5   Entered and Authorized by:   [name] MD   Signed by:   [name] MD on 06/03/2009   Method used:   Electronically to        [facility] (retail)       [place] [address]       [place],   [address]  Ph: [phone]       Fax: [phone]   RxID:   [id number]

## 2010-04-22 NOTE — Progress Notes (Signed)
Summary: RX  Phone Note Call from Patient   Summary of Call: NEEDS RX SENT TO [facility] IN [place] FOR HER STRIPS AND LANTUS FOR THE ACCU CHECK MACHINE Initial call taken by: Dierdre Harness,  June 18, 2009 9:27 AM  Follow-up for Phone Call        pls fax rx Follow-up by: [name] MD,  June 18, 2009 8:10 PM  Additional Follow-up for Phone Call Additional follow up Details #1::        strips and lancets sent as requested Additional Follow-up by: Baldomero Lamy LPN,  June 19, 6436 10:05 AM    New/Updated Medications: ACCU-CHEK AVIVA  STRP (GLUCOSE BLOOD) once daily testing ( DX:250.00) ACCU-CHEK MULTICLIX LANCETS  MISC (LANCETS) once daily testing (DX:250.00)10 Prescriptions: ACCU-CHEK MULTICLIX LANCETS  MISC (LANCETS) once daily testing (DX:250.00)10  #100 x 2   Entered by:   Baldomero Lamy LPN   Authorized by:   [name] MD   Signed by:   Baldomero Lamy LPN on 38/18/4037   Method used:   Electronically to        [facility]  [address] (retail)       [place]       [place], [place]  [address]       Ph: [phone]       Fax: [phone]   RxID:   [id number] [place] (GLUCOSE BLOOD) once daily testing ( DX:250.00)  #100 x 2   Entered by:   Baldomero Lamy LPN   Authorized by:   [name] MD   Signed by:   Baldomero Lamy LPN on 50/51/8335   Method used:   Electronically to        [facility] [address] (retail)       [address]       [place], [place]  [address]       Ph: [phone]       Fax: [phone]   RxID:   [id number]

## 2010-04-22 NOTE — Progress Notes (Signed)
Summary: med  Phone Note Call from Patient   Summary of Call: the pharm doesn't have her med. the maxide [phone] Initial call taken by: Lenn Cal,  Aug 12, 2009 3:47 PM  Follow-up for Phone Call        Do you want to change her med? She said she used to take the Losartan. [facility] [place]. She only has 2 pills left of the Maxzide Follow-up by: Kate Sable LPN,  Aug 12, 3708 6:26 PM  Additional Follow-up for Phone Call Additional follow up Details #1::        let her know losartan has been sent in pls fax and write stop maxzide to thepharmacy Additional Follow-up by: [name] MD,  Aug 12, 2009 5:09 PM    Additional Follow-up for Phone Call Additional follow up Details #2::    Patient aware Follow-up by: Kate Sable LPN,  Aug 14, 9483 4:62 AM  New/Updated Medications: LOSARTAN POTASSIUM-HCTZ 100-12.5 MG TABS (LOSARTAN POTASSIUM-HCTZ) Take 1 tablet by mouth once a day Prescriptions: LOSARTAN POTASSIUM-HCTZ 100-12.5 MG TABS (LOSARTAN POTASSIUM-HCTZ) Take 1 tablet by mouth once a day  #30 x 3   Entered and Authorized by:   [name] MD   Signed by:   [name] MD on 08/12/2009   Method used:   Printed then faxed to ...       [facility] [address] [facility] (retail)       [address] [place], [place]  [address]       Ph: [phone]       Fax: [phone]   RxID:   [id number]

## 2010-04-22 NOTE — Miscellaneous (Signed)
Summary: Orders Update  Clinical Lists Changes  Problems: Added new problem of FERRITIN, ELEVATED (ICD-790.6) Orders: Added new Test order of T-Ferritin [phone]) - Signed

## 2010-04-22 NOTE — Progress Notes (Signed)
Summary: LAB ORDER  Phone Note Call from Patient   Summary of Call: NEEDS A LAB SHEET AND WILL YOU GIVE IT TO HER HUSBAND WHEN HE COMES IN [place] Initial call taken by: Dierdre Harness,  December 19, 2009 9:09 AM  Follow-up for Phone Call        Nothing has been ordered for her since the last was done 09/19/2009. Dr. Moshe Cipro will have to order. Coming in for next app on Oct 18th. Husband wants to collect lab sheet monday for her Follow-up by: Kate Sable LPN,  December 19, 8865 9:14 AM  Additional Follow-up for Phone Call Additional follow up Details #1::        fasting lipid hepatic hBA1C needed, let her know and order pls, she can collect the day of or you can fax the order to the lab whatever works Additional Follow-up by: [name] MD,  December 23, 2009 12:16 PM    Additional Follow-up for Phone Call Additional follow up Details #2::    patient aware Follow-up by: Baldomero Lamy LPN,  December 24, 7371 1:22 PM

## 2010-04-22 NOTE — Progress Notes (Signed)
Summary: CHANGE OF MEDICINE  Phone Note Call from Patient   Summary of Call: DR .[facility] HER MEDICINE AND HER FEET AND ANKLES ARE SWELLING CALL BACK AT [phone] Initial call taken by: Dierdre Harness,  August 26, 2009 1:30 PM  Follow-up for Phone Call        You changed her Maxzide because there was a shortage at the pharmacy, now she is swelling. Do you want her to come in for office visit or nurse visit? Follow-up by: Kate Sable LPN,  August 26, 2917 1:66 PM  Additional Follow-up for Phone Call Additional follow up Details #1::        she needs an office visitpls sched Additional Follow-up by: [name] MD,  August 26, 2009 2:44 PM    Additional Follow-up for Phone Call Additional follow up Details #2::    COMING [facility] @ 2:45 Follow-up by: Dierdre Harness,  August 26, 2009 2:47 PM

## 2010-04-22 NOTE — Assessment & Plan Note (Signed)
Summary: NASH   Visit Type:  Follow-up Visit Primary Care Provider:  Moshe Cipro, M.D.  Chief Complaint:  F/U labs.  History of Present Illness: On Zetia for past 3 mos. Dr. Moshe Cipro drew liver enzymes in OCT 2010. No itching, yellow eyes, or fatigue. Appetite good.   Current Medications (verified): 1)  Cvs Natural Fish Oil 1000 Mg Caps (Omega-3 Fatty Acids) .... Take One Tab By Mouth Twice Daily 2)  Womens Multivitamin Plus  Tabs (Multiple Vitamins-Minerals) .... Take One-Half Tab By Mouth Once Daily 3)  Adult Aspirin Ec Low Strength 81 Mg Tbec (Aspirin) .... Take One Tab By Mouth Once Daily 4)  Vitamin D (Ergocalciferol) 50000 Unit Caps (Ergocalciferol) .... One Tablet Once Weekly 5)  Zetia 10 Mg Tabs (Ezetimibe) .... Take 1 Tab By Mouth At Bedtime 6)  Accu-Chek Aviva  Strp (Glucose Blood) .... Once Daily Testing ( Dx:250.00) 7)  Accu-Chek Multiclix Lancets  Misc (Lancets) .... Once Daily Testing (Dx:250.00)10 8)  Maxzide 75-50 Mg Tabs (Triamterene-Hctz) .... Take 1 Tablet By Mouth Once A Day  Allergies: 1)  ! Pcn  Past History:  Past Medical History:  Elevated liver enzymes since 2005 RESTLESS LEG SYNDROME, HX OF (ICD-V12.49) HYPERTENSION (ICD-401.9) HYPERLIPIDEMIA (ICD-272.4) uterine cancer 2008 Type 2 DM 2011 obesity 2011 Colonoscopy by Dr. Irving Shows, 2003, h/o polyps per patient TCS, 3/09, Dr. Gala Romney -->. Normal rectum. Sigmoid diverticula, diminutive hepatic flexure polyp status post cold biopsy.  Remainder of colonic mucosa and terminal mucosa appeared normal. Next TCS due 05/2012  Past Surgical History: Reviewed history from 08/12/2009 and no changes required. Appendectomy  1973 Cholecystectomy 1973 Tonsillectomy  age 16 Removal of benign mass from both cheeks in 2005 TAH-BSO- 12/14/2006 Left bunionectomy 2006  Review of Systems       2008: 185 LBS VIRAL HEP SEROS NEG   AST 44H   ALT 32 ALB 4.1  2009: AST 64 ALT 80  JUNE 2011:AST 54H ALT 41H o/w nl HFP  Vital  Signs:  Patient profile:   77 year old female Menstrual status:  hysterectomy Height:      66 inches Weight:      187 pounds BMI:     30.29 Temp:     98.3 degrees F oral Pulse rate:   84 / minute BP sitting:   130 / 70  (left arm) Cuff size:   large  Vitals Entered By: Waldon Merl LPN (November 05, 1100 4:25 PM)  Physical Exam  General:  Well developed, well nourished, no acute distress. Head:  Normocephalic and atraumatic. Eyes:  PERRL, no icterus. Mouth:  No deformity or lesions. Lungs:  Clear throughout to auscultation. Heart:  Regular rate and rhythm; no murmurs. Abdomen:  Soft, nontender and nondistended. Normal bowel sounds. obese.    Impression & Recommendations:  Problem # 1:  FERRITIN, ELEVATED (ICD-790.6) 2o to oxidative stress associated with NASH. Nl iron sat and iron level. Pt needs to lose 10-15 lbs. Follow a low fat diet. HO given. HFP in OCT 2011. OPV in 6 mos.  CC: PCP  Appended Document: Orders Update    Clinical Lists Changes  Problems: Added new problem of OTHER CHRONIC NONALCOHOLIC LIVER DISEASE (TRZ-735.6) Orders: Added new Service order of Est. Patient Level IV (70141) - Signed

## 2010-04-22 NOTE — Letter (Signed)
Summary: Recall, Labs Needed  [facility]  [address]   [place], [place] [address]   Phone: [phone]  Fax: [phone]    October 14, 2009  [place], [place]  [address] April 12, 1932   Dear Ms. Faith,   Our records indicate it is time to repeat your blood work.  You can take the enclosed form to the lab on or near the date indicated.  Please make note of the new location of the lab:   [place], 2nd floor   [place] office will call you within a week to ten business days with the results.  If you do not hear from [place] in 10 business days, you should call the office.  If you have any questions regarding this, call the office at [phone], and ask for the nurse.  Labs are due on 10/21/2009.   Sincerely,    Burnadette Peter LPN  [facility] Ph: [phone]   Fax: [phone]

## 2010-04-22 NOTE — Letter (Signed)
Summary: Letter TO PHARMACY  Letter TO PHARMACY   Imported By: Dierdre Harness 01/07/2010 14:34:58  _____________________________________________________________________  External Attachment:    Type:   Image     Comment:   External Document

## 2010-04-22 NOTE — Letter (Signed)
Summary: History form  History form   Imported By: Ruffin Pyo 09/09/2009 12:45:22  _____________________________________________________________________  External Attachment:    Type:   Image     Comment:   External Document

## 2010-04-22 NOTE — Assessment & Plan Note (Signed)
Summary: labs   Vital Signs:  Patient profile:   77 year old female Menstrual status:  hysterectomy Height:      66 inches Weight:      184.25 pounds BMI:     29.85 O2 Sat:      99 % on Room air Pulse rate:   95 / minute Pulse rhythm:   regular Resp:     16 per minute BP sitting:   112 / 74  (left arm) Cuff size:   regular  Vitals Entered By: Kate Sable LPN (October 03, 238 9:73 PM)  Nutrition Counseling: Patient's BMI is greater than 25 and therefore counseled on weight management options.  O2 Flow:  Room air CC: Follow up chronic problems-labs- per Dr. Moshe Cipro   Primary Care Provider:  Moshe Cipro  CC:  Follow up chronic problems-labs- per Dr. Moshe Cipro.  History of Present Illness: Reports  that she has been doing fairly well. Denies recent fever or chills. Denies sinus pressure, nasal congestion , ear pain or sore throat. Denies chest congestion, or cough productive of sputum. Denies chest pain, palpitations, PND, orthopnea or leg swelling. Denies abdominal pain, nausea, vomitting, diarrhea or constipation. Denies change in bowel movements or bloody stool. Denies dysuria , frequency, incontinence or hesitancy. She is still experiencing right knee pain with some insatbility, no ortho f/u at this time, she did get an intrarticular injection Denies headaches, vertigo, seizures. Denies depression, anxiety or insomnia. Denies  rash, lesions, or itch. She has changed her diet and has commited to daily walking to improve her blood sugars and has been succesful, she is here to review her labs also     Allergies (verified): 1)  ! Pcn  Review of Systems      See HPI Eyes:  Denies discharge, eye pain, and red eye. MS:  Complains of joint pain and stiffness. Endo:  Denies cold intolerance, excessive hunger, excessive thirst, excessive urination, heat intolerance, polyuria, and weight change; hBA1C has improved with lifestyle change. Heme:  Denies abnormal bruising and  bleeding. Allergy:  Denies hives or rash and itching eyes.  Physical Exam  General:  Well-developed,well-nourished,in no acute distress; alert,appropriate and cooperative throughout examination HEENT: No facial asymmetry,  EOMI, No sinus tenderness, TM's Clear, oropharynx  pink and moist.   Chest: Clear to auscultation bilaterally.  CVS: S1, S2, No murmurs, No S3.   Abd: Soft, Nontender.  MS: Adequate ROM spine, hips, shoulders and reduced in  knees.  Ext: No edema.   CNS: CN 2-12 intact, power tone and sensation normal throughout.   Skin: Intact, no visible lesions or rashes.  Psych: Good eye contact, normal affect.  Memory intact, not anxious or depressed appearing.    Impression & Recommendations:  Problem # 1:  FERRITIN, ELEVATED (ICD-790.6) Assessment Comment Only pt to see GI about this  Problem # 2:  OBESITY (ICD-278.00) Assessment: Improved  Ht: 66 (10/02/2009)   Wt: 184.25 (10/02/2009)   BMI: 29.85 (10/02/2009)  Problem # 3:  HYPERTENSION (ICD-401.9) Assessment: Improved  Her updated medication list for this problem includes:    Maxzide 75-50 Mg Tabs (Triamterene-hctz) .Marland Kitchen... Take 1 tablet by mouth once a day  BP today: 112/74 Prior BP: 140/78 (08/28/2009)  Labs Reviewed: K+: 4.0 (09/19/2009) Creat: : 0.86 (09/19/2009)   Chol: 207 (09/19/2009)   HDL: 68 (09/19/2009)   LDL: 121 (09/19/2009)   TG: 88 (09/19/2009)  Problem # 4:  HYPERLIPIDEMIA (ICD-272.4) Assessment: Improved  Her updated medication list for this problem includes:  Zetia 10 Mg Tabs (Ezetimibe) .Marland Kitchen... Take 1 tab by mouth at bedtime  Labs Reviewed: SGOT: 54 (09/19/2009)   SGPT: 41 (09/19/2009)   HDL:68 (09/19/2009), 76 (05/22/2009)  LDL:121 (09/19/2009), 144 (05/22/2009)  Chol:207 (09/19/2009), 234 (05/22/2009)  Trig:88 (09/19/2009), 69 (05/22/2009)  Problem # 5:  IMPAIRED FASTING GLUCOSE (ICD-790.21) Assessment: Improved  Labs Reviewed: Creat: 0.86 (09/19/2009)   hBA1C improved now 6.3,  was 6.5  Complete Medication List: 1)  Cvs Natural Fish Oil 1000 Mg Caps (Omega-3 fatty acids) .... Take one tab by mouth twice daily 2)  Womens Multivitamin Plus Tabs (Multiple vitamins-minerals) .... Take one tab by mouth once daily 3)  Adult Aspirin Ec Low Strength 81 Mg Tbec (Aspirin) .... Take one tab by mouth once daily 4)  Vitamin D (ergocalciferol) 50000 Unit Caps (Ergocalciferol) .... One tablet once weekly 5)  Zetia 10 Mg Tabs (Ezetimibe) .... Take 1 tab by mouth at bedtime 6)  Accu-chek Aviva Strp (Glucose blood) .... Once daily testing ( dx:250.00) 7)  Accu-chek Multiclix Lancets Misc (Lancets) .... Once daily testing (dx:250.00)10 8)  Maxzide 75-50 Mg Tabs (Triamterene-hctz) .... Take 1 tablet by mouth once a day  Patient Instructions: 1)  F/U as before. 2)  Blood presssure and labs are improved, congrats, also omn the weight loss. 3)  No med changes at this tiime

## 2010-04-22 NOTE — Medication Information (Signed)
Summary: Visual merchandiser   Imported By: Dierdre Harness 07/18/2009 14:25:12  _____________________________________________________________________  External Attachment:    Type:   Image     Comment:   External Document

## 2010-04-22 NOTE — Progress Notes (Signed)
  Phone Note From Pharmacy   Caller: [facility] Summary of Call: zetia requires pa Initial call taken by: Baldomero Lamy LPN,  June 19, 2479 11:50 AM  Follow-up for Phone Call        pls pA,  Follow-up by: [name] MD,  June 18, 2009 8:30 PM  Additional Follow-up for Phone Call Additional follow up Details #1::        sent for pa Additional Follow-up by: Baldomero Lamy LPN,  July 15, 8588 4:28 PM  New Problems: LIVER FUNCTION TESTS, ABNORMAL, HX OF (ICD-V12.2)   New Problems: LIVER FUNCTION TESTS, ABNORMAL, HX OF (ICD-V12.2)

## 2010-04-22 NOTE — Letter (Signed)
Summary: Sherol Dade MD  Sherol Dade MD   Imported By: Dierdre Harness 02/04/2010 09:35:43  _____________________________________________________________________  External Attachment:    Type:   Image     Comment:   External Document

## 2010-04-22 NOTE — Assessment & Plan Note (Signed)
Summary: FATTY LIVER,ELEVATED LFTs/ss   Visit Type:  Consult Referring Provider:  Moshe Cipro Primary Care Provider:  Moshe Cipro  Chief Complaint:  fatty liver/elevated lft's.  History of Present Illness: Ms. Kawa is a pleasant 77 y/o female, referred by Dr. Moshe Cipro for further evaluation of elevated transaminases. We initially saw pt in 7/08 for the same. She had abd u/s which showed CBD of 52m, there was ?artifact within lumen of CBD. MRCP done which showed CBD868mand normal pancreas, hepatic cyst. Patient did not come back for appt in 12/08 as planned. She has continued to have AST/ALT in range of 50-60s for past three years. She previously had negative Hep B and C markers. She states she has not been on STATINS because of this. She started Zetia about one to two months ago. She feels well. She has chronic intermittent constipation. No melena, rb, abd pain, n/v, gerd, weight loss. She has noticed some lower ext swelling since changing from maxide to losartin/hctz couple weeks ago. The dose of HCTZ went down from 503mo 12.5mg65mer weight is up about three pounds. Recently diagnosed with DM, trying to control with diet first.  Current Medications (verified): 1)  Cvs Natural Fish Oil 1000 Mg Caps (Omega-3 Fatty Acids) .... Take One Tab By Mouth Twice Daily 2)  Womens Multivitamin Plus  Tabs (Multiple Vitamins-Minerals) .... Take One Tab By Mouth Once Daily 3)  Adult Aspirin Ec Low Strength 81 Mg Tbec (Aspirin) .... Take One Tab By Mouth Once Daily 4)  Vitamin D (Ergocalciferol) 50000 Unit Caps (Ergocalciferol) .... One Tablet Once Weekly 5)  Zetia 10 Mg Tabs (Ezetimibe) .... Take 1 Tab By Mouth At Bedtime 6)  Accu-Chek Aviva  Strp (Glucose Blood) .... Once Daily Testing ( Dx:250.00) 7)  Accu-Chek Multiclix Lancets  Misc (Lancets) .... Once Daily Testing (Dx:250.00)10 8)  Losartan Potassium-Hctz 100-12.5 Mg Tabs (Losartan Potassium-Hctz) .... Take 1 Tablet By Mouth Once A Day  Allergies  (verified): 1)  ! Pcn  Past History:  Past Surgical History: Last updated: 08/12/2009 Appendectomy  1973 Cholecystectomy 1973 Tonsillectomy  age 12 R20oval of benign mass from both cheeks in 2005 TAH-BSO- 12/14/2006 Left bunionectomy 2006  Past Medical History:   RESTLESS LEG SYNDROME, HX OF (ICD-V12.49) HYPERTENSION (ICD-401.9) HYPERLIPIDEMIA (ICD-272.4) uterine cancer 2008 Type 2 DM 2011 obesity 2011 Colonoscopy by Dr. LeroIrving Shows03, h/o polyps per patient TCS, 3/09, Dr. RourGala Romney. Normal rectum. Sigmoid diverticula, diminutive hepatic flexure polyp status post cold biopsy.  Remainder of colonic mucosa and terminal mucosa appeared normal. Next TCS due 05/2012  Family History: Mother deceased at 73- 40ain tumor Father deceased 4 si43ters- 1 diabetic, 1 with breast cancer, 1 died at age 85 o1brain aneurysm 2 brothers living- 1 with prostate cancer No FH of CRC.  Social History: Retired from [employer] Never Smoked Alcohol use-no Drug use-no Regular exercise-no 3 adult sons  Review of Systems General:  Denies fever, chills, fatigue, weakness, and weight loss. Eyes:  Denies vision loss. ENT:  Denies nasal congestion, sore throat, hoarseness, and difficulty swallowing. CV:  Complains of peripheral edema; denies chest pains, angina, palpitations, and dyspnea on exertion. Resp:  Denies dyspnea at rest, dyspnea with exercise, and cough. GI:  See HPI. GU:  Denies urinary burning and blood in urine. MS:  Denies joint pain / LOM. Derm:  Denies rash and itching. Neuro:  Denies weakness, frequent headaches, memory loss, and confusion. Psych:  Denies depression and anxiety. Endo:  Complains of unusual weight change.  Heme:  Denies bruising and bleeding. Allergy:  Denies hives and rash.  Vital Signs:  Patient profile:   77 year old female Menstrual status:  hysterectomy Height:      65 inches Weight:      191.50 pounds BMI:     31.98 Pulse rate:   68 /  minute BP sitting:   120 / 78  (left arm) Cuff size:   regular  Vitals Entered By: Waldon Merl LPN (August 23, 3534 1:44 PM)  Physical Exam  General:  Well developed, well nourished, no acute distress. Head:  Normocephalic and atraumatic. Eyes:  Conjunctivae pink, no scleral icterus.  Mouth:  Oropharyngeal mucosa moist, pink.  No lesions, erythema or exudate.    Neck:  Supple; no masses or thyromegaly. Lungs:  Clear throughout to auscultation. Heart:  Regular rate and rhythm; no murmurs, rubs,  or bruits. Abdomen:  Bowel sounds normal.  Abdomen is soft, nontender, nondistended.  No rebound or guarding.  No hepatosplenomegaly, masses or hernias.  No abdominal bruits.  Extremities:  1+ pedal edema, bilaterally Neurologic:  Alert and  oriented x4;  grossly normal neurologically. Skin:  Intact without significant lesions or rashes. Cervical Nodes:  No significant cervical adenopathy. Psych:  Alert and cooperative. Normal mood and affect.  Impression & Recommendations:  Problem # 1:  TRANSAMINASES, SERUM, ELEVATED (ICD-790.4) Assessment Improved  Chronically mildly elevated transaminases, fatty liver on prior u/s. Suspect due to fatty liver but further w/u needed. Already has abd u/s scheduled for next week, will f/u. Will add labs as follows. Advised to optimize diet for tight glycemic control. Weight loss of 10 pounds. OV with Dr. Gala Romney in 10 weeks.   Orders: T-Immunoglobulins, Quantitative (31540) T-AMA [phone]) T-ANA [phone]) T-Anti SMA [id number]) T-Iron [phone]) T-Iron Binding Capacity (TIBC) ([address]) T-Ferritin (53299-24268) Consultation Level III (34196)  Problem # 2:  PERIPHERAL EDEMA (ICD-782.3)  Likely to decreased HCTZ dose in her new BP med. Advised to f/u with Dr. Moshe Cipro.  Orders: Consultation Level III (22297) I would like to thank Dr. Moshe Cipro for allowing [place] to take part in the care of this nice patient.  Appended Document: FATTY  LIVER,ELEVATED LFTs/ss Reviewed abd u/s. Nothing to explain abnormal lfts. Liver looked good. CBD 7.51m s/p cholecystectomy.  Await labs.  Appended Document: FATTY LIVER,ELEVATED LFTs/ss Pt informed. Hasn't had labs done yet, will do so within the next week.

## 2010-04-22 NOTE — Letter (Signed)
Summary: handicapp card  handicapp card   Imported By: Dierdre Harness 06/14/2009 16:00:06  _____________________________________________________________________  External Attachment:    Type:   Image     Comment:   External Document

## 2010-04-22 NOTE — Assessment & Plan Note (Signed)
Summary: OV   Vital Signs:  Patient profile:   77 year old female Menstrual status:  hysterectomy Height:      65 inches Weight:      191 pounds BMI:     31.90 O2 Sat:      98 % Pulse rate:   87 / minute Pulse rhythm:   regular Resp:     16 per minute BP sitting:   140 / 78  (left arm) Cuff size:   regular  Vitals Entered By: Kate Sable LPN (August 28, 1189 4:78 PM)  Nutrition Counseling: Patient's BMI is greater than 25 and therefore counseled on weight management options. CC: Ever since the switch to Losartan she has been swelling in her legs and feet that doesn't go down at night   Primary Care Provider:  Moshe Cipro  CC:  Ever since the switch to Losartan she has been swelling in her legs and feet that doesn't go down at night.  History of Present Illness: Pt in today with a primary concern of leg swelling since changing to an "available' BP med which has no diuretic.she denies PND, orthopnea, palpitations or chest pain. Reports  that she has otherwise been doing well.. Denies recent fever or chills. Denies sinus pressure, nasal congestion , ear pain or sore throat. Denies chest congestion, or cough productive of sputum. Denies chest pain, palpitations, PND, orthopnea  Denies abdominal pain, nausea, vomitting, diarrhea or constipation. Denies change in bowel movements or bloody stool. Denies dysuria , frequency, incontinence or hesitancy. Denies  joint pain, swelling, or reduced mobility. Denies headaches, vertigo, seizures. Denies depression, anxiety or insomnia. Denies  rash, lesions, or itch.     Allergies: 1)  ! Pcn  Review of Systems      See HPI Eyes:  Denies discharge and red eye. Endo:  Denies cold intolerance, excessive hunger, excessive thirst, excessive urination, heat intolerance, polyuria, and weight change. Heme:  Denies abnormal bruising and bleeding. Allergy:  Denies hives or rash and itching eyes.  Physical Exam  General:   Well-developed,well-nourished,in no acute distress; alert,appropriate and cooperative throughout examination HEENT: No facial asymmetry,  EOMI, No sinus tenderness, TM's Clear, oropharynx  pink and moist.   Chest: Clear to auscultation bilaterally.  CVS: S1, S2, No murmurs, No S3.   Abd: Soft, Nontender.  MS: Adequate ROM spine, hips, shoulders and knees.  Ext: No edema.   CNS: CN 2-12 intact, power tone and sensation normal throughout.   Skin: Intact, no visible lesions or rashes.  Psych: Good eye contact, normal affect.  Memory intact, not anxious or depressed appearing.    Impression & Recommendations:  Problem # 1:  LEG EDEMA, BILATERAL (ICD-782.3) Assessment Comment Only  The following medications were removed from the medication list:    Losartan Potassium-hctz 100-12.5 Mg Tabs (Losartan potassium-hctz) .Marland Kitchen... Take 1 tablet by mouth once a day Her updated medication list for this problem includes:    Maxzide 75-50 Mg Tabs (Triamterene-hctz) .Marland Kitchen... Take 1 tablet by mouth once a day  Orders: Furosemide- Lasix Injection (G9562) Admin of Therapeutic Inj  intramuscular or subcutaneous (13086)  Problem # 2:  OBESITY (ICD-278.00) Assessment: Unchanged  Ht: 65 (08/28/2009)   Wt: 191 (08/28/2009)   BMI: 31.90 (08/28/2009)  Problem # 3:  HYPERTENSION (ICD-401.9) Assessment: Deteriorated  The following medications were removed from the medication list:    Losartan Potassium-hctz 100-12.5 Mg Tabs (Losartan potassium-hctz) .Marland Kitchen... Take 1 tablet by mouth once a day Her updated medication list for  this problem includes:    Maxzide 75-50 Mg Tabs (Triamterene-hctz) .Marland Kitchen... Take 1 tablet by mouth once a day  BP today: 140/78 Prior BP: 120/78 (08/22/2009)  Labs Reviewed: K+: 4.2 (05/22/2009) Creat: : 0.72 (05/22/2009)   Chol: 234 (05/22/2009)   HDL: 76 (05/22/2009)   LDL: 144 (05/22/2009)   TG: 69 (05/22/2009)  Problem # 4:  HYPERLIPIDEMIA (ICD-272.4) Assessment: Comment Only  Her  updated medication list for this problem includes:    Zetia 10 Mg Tabs (Ezetimibe) .Marland Kitchen... Take 1 tab by mouth at bedtime  Labs Reviewed: SGOT: 61 (05/22/2009)   SGPT: 48 (05/22/2009)   HDL:76 (05/22/2009), 69 (02/05/2009)  LDL:144 (05/22/2009), 141 (02/05/2009)  Chol:234 (05/22/2009), 227 (02/05/2009)  Trig:69 (05/22/2009), 83 (02/05/2009)  Complete Medication List: 1)  Cvs Natural Fish Oil 1000 Mg Caps (Omega-3 fatty acids) .... Take one tab by mouth twice daily 2)  Womens Multivitamin Plus Tabs (Multiple vitamins-minerals) .... Take one tab by mouth once daily 3)  Adult Aspirin Ec Low Strength 81 Mg Tbec (Aspirin) .... Take one tab by mouth once daily 4)  Vitamin D (ergocalciferol) 50000 Unit Caps (Ergocalciferol) .... One tablet once weekly 5)  Zetia 10 Mg Tabs (Ezetimibe) .... Take 1 tab by mouth at bedtime 6)  Accu-chek Aviva Strp (Glucose blood) .... Once daily testing ( dx:250.00) 7)  Accu-chek Multiclix Lancets Misc (Lancets) .... Once daily testing (dx:250.00)10 8)  Maxzide 75-50 Mg Tabs (Triamterene-hctz) .... Take 1 tablet by mouth once a day  Patient Instructions: 1)  f/u as before. 2)  pls stop losartan, you do have leg swelling, and we will give you lasix 81m in the office . 3)  Fill script for maxzide and start tomorrrow, [email or web address] Prescriptions: MAXZIDE 75-50 MG TABS (TRIAMTERENE-HCTZ) Take 1 tablet by mouth once a day  #90 x 1   Entered and Authorized by:   MTula NakayamaMD   Signed by:   MTula NakayamaMD on 08/28/2009   Method used:   Print then Give to Patient   RxID:   [id number]   Medication Administration  Injection # 1:    Medication: Furosemide- Lasix Injection    Diagnosis: LEG EDEMA, BILATERAL (ICD-782.3)    Route: IM    Site: RUOQ gluteus    Exp Date: 05/2010    Lot #: [id number]   Mfr: hospira    Comments: 128mgiven     Patient tolerated injection without complications    Given by: BrKate SablePN (June  8,  206945:0:38M)  Orders Added: 1)  Est. Patient Level III [9[88280])  Furosemide- Lasix Injection [J1940] 3)  Admin of Therapeutic Inj  intramuscular or subcutaneous [9[03491]

## 2010-04-24 NOTE — Progress Notes (Signed)
Summary: bladder infection  Phone Note Call from Patient   Summary of Call: has a bladder infection will you call in something at [facility]  call back at [phone] to let her know Initial call taken by: Dierdre Harness,  March 18, 2010 9:31 AM  Follow-up for Phone Call        no abx without ov or nurse visit, please schedule nurse visit for patient Follow-up by: Baldomero Lamy LPN,  March 18, 3153 9:53 AM  Additional Follow-up for Phone Call Additional follow up Details #1::        [place] Additional Follow-up by: Dierdre Harness,  March 18, 2010 10:16 AM

## 2010-04-24 NOTE — Assessment & Plan Note (Signed)
Summary: BLADDER INFECTION  Nurse Visit   Vital Signs:  Patient profile:   77 year old female Menstrual status:  hysterectomy Weight:      182.75 pounds BP sitting:   90 / 58  (left arm)  Vitals Entered By: Baldomero Lamy LPN (March 19, 5435 11:58 AM)  Comments complains of malodorous urine, burning with urinationa and urgency   Allergies: 1)  ! Pcn Laboratory Results   Urine Tests  Date/Time Received: March 18, 2010 11:59 AM  Date/Time Reported: March 18, 2010 11:59 AM   Routine Urinalysis   Color: yellow Appearance: Clear Glucose: negative   (Normal Range: Negative) Bilirubin: negative   (Normal Range: Negative) Ketone: negative   (Normal Range: Negative) Spec. Gravity: 1.015   (Normal Range: 1.003-1.035) Blood: small   (Normal Range: Negative) pH: 6.5   (Normal Range: 5.0-8.0) Protein: trace   (Normal Range: Negative) Urobilinogen: 0.2   (Normal Range: 0-1) Nitrite: positive   (Normal Range: Negative) Leukocyte Esterace: small   (Normal Range: Negative)       Orders Added: 1)  Urinalysis [81003-65000] Prescriptions: CIPROFLOXACIN HCL 500 MG TABS (CIPROFLOXACIN HCL) Take 1 tablet by mouth two times a day  #10 x 0   Entered and Authorized by:   [name] MD   Signed by:   [name] MD on 03/18/2010   Method used:   Electronically to        [facility] (retail)       [place] [address]       [place], [place]  [address]       Ph: [phone]       Fax: [phone]   RxID:   [id number]   Laboratory Results   Urine Tests    Routine Urinalysis   Color: yellow Appearance: Clear Glucose: negative   (Normal Range: Negative) Bilirubin: negative   (Normal Range: Negative) Ketone: negative   (Normal Range: Negative) Spec. Gravity: 1.015   (Normal Range: 1.003-1.035) Blood: small   (Normal Range: Negative) pH: 6.5   (Normal Range: 5.0-8.0) Protein: trace   (Normal Range: Negative) Urobilinogen:  0.2   (Normal Range: 0-1) Nitrite: positive   (Normal Range: Negative) Leukocyte Esterace: small   (Normal Range: Negative)       Appended Document: BLADDER INFECTION

## 2010-04-24 NOTE — Letter (Signed)
Summary: right source form  right source form   Imported By: Dierdre Harness 03/10/2010 15:35:10  _____________________________________________________________________  External Attachment:    Type:   Image     Comment:   External Document

## 2010-04-30 NOTE — Progress Notes (Signed)
Summary: EYE EXAM  Phone Note Call from Patient   Summary of Call: WANTS TO KNOW  WILL  YOU REFER HER DR. HUNT FOR A EYE EXAM CALL HER WITH THE APPOINMENT Initial call taken by: Dierdre Harness,  April 22, 2010 11:37 AM  Follow-up for Phone Call        this is fine I will send in order Follow-up by: [name] MD,  April 22, 2010 12:13 PM  Additional Follow-up for Phone Call Additional follow up Details #1::        pt has appt with dr. Elna Breslow office for 04/29/2010. pt aware Additional Follow-up by: Lenn Cal,  April 22, 2010 4:27 PM  New Problems: UNSPECIFIED VISUAL LOSS (ICD-369.9)   New Problems: UNSPECIFIED VISUAL LOSS (ICD-369.9)

## 2010-05-15 ENCOUNTER — Telehealth: Payer: Self-pay | Admitting: Family Medicine

## 2010-05-20 NOTE — Progress Notes (Signed)
Summary: medicine  Phone Note Call from Patient   Summary of Call: pt would likt to take cholefoto. please call pt [phone] Not sure if medicine is spelled correctly. Initial call taken by: Lenn Cal,  May 15, 2010 10:42 AM  Follow-up for Phone Call        Effie Shy is an otc med, patient wants to know if she can take this instead of zetia Follow-up by: Baldomero Lamy LPN,  May 16, 1957 8:25 AM  Additional Follow-up for Phone Call Additional follow up Details #1::        I do not know the ingredients, and potential interactions with her prescription med or her liver so i cannot advise her that it is safe Additional Follow-up by: [name] MD,  May 16, 2010 3:14 PM    Additional Follow-up for Phone Call Additional follow up Details #2::    patients aware Follow-up by: Baldomero Lamy LPN,  May 16, 7469 4:12 PM

## 2010-06-23 ENCOUNTER — Ambulatory Visit (INDEPENDENT_AMBULATORY_CARE_PROVIDER_SITE_OTHER): Payer: Medicare HMO | Admitting: Family Medicine

## 2010-06-23 DIAGNOSIS — N3090 Cystitis, unspecified without hematuria: Secondary | ICD-10-CM

## 2010-06-23 LAB — POCT URINALYSIS DIPSTICK
Bilirubin, UA: NEGATIVE
Ketones, UA: NEGATIVE
Protein, UA: NEGATIVE
Spec Grav, UA: 1.02
pH, UA: 6

## 2010-06-23 MED ORDER — SULFAMETHOXAZOLE-TRIMETHOPRIM 800-160 MG PO TABS: 1.0000 | ORAL_TABLET | Freq: Two times a day (BID) | ORAL | Status: AC

## 2010-06-23 NOTE — Progress Notes (Signed)
Urinalysis positive for infection. Will send for culture

## 2010-06-26 LAB — URINE CULTURE

## 2010-08-04 ENCOUNTER — Other Ambulatory Visit: Payer: Self-pay | Admitting: Family Medicine

## 2010-08-05 NOTE — Consult Note (Signed)
NAME:  [name] [name], [name] [name]                  ACCOUNT NO.:  [id number]   MEDICAL RECORD NO.:  [id number]          PATIENT TYPE:  OUT   LOCATION:  GYN                          FACILITY:  [facility]   PHYSICIAN:  Marti Sleigh, M.D.DATE OF BIRTH:  04-Aug-1932   DATE OF CONSULTATION:  11/10/2006  DATE OF DISCHARGE:                                 CONSULTATION   CHIEF COMPLAINT:  Endometrial cancer.   HISTORY OF PRESENT ILLNESS:  A 77 year old [place] American female seen  in consultation at the request of Dr. Mallory Shirk regarding new  diagnosis of endometrial carcinoma.  The patient presented with  postmenopausal bleeding and an endometrial biopsy was obtained revealing  a well-differentiated endometrial cancer.  She had a pelvic ultrasound  which showed an endometrial stripe of only 6 mm.  Otherwise, the patient  denies any gynecologic history.  She claims she underwent menopause at  age 55.  She is not taking any hormone replacement therapy.   PAST MEDICAL HISTORY:  Medical illnesses  1. Hypertension.  2. Mildly abnormal liver function tests.  The patient has been      undergoing evaluation by Dr. Gala Romney and it appears from his records      that he believes the mild elevation in her AST and ALT are her most      likely secondary to fatty liver infiltration.  3. The patient also has renal artery stenosis.  She had been evaluated      by an interventional radiologist who does not believe it is severe      enough to be treated at this time.   CURRENT MEDICATIONS:  Maxzide, multivitamins.   PAST SURGICAL HISTORY:  1. Open cholecystectomy.  2. Tonsils and adenoidectomy.   DRUG ALLERGIES:  PENICILLIN (swelling).   OBSTETRICAL HISTORY:  Gravida 3.   FAMILY HISTORY:  The patient has an aunt with an unknown type of cancer   SOCIAL HISTORY:  The patient is married.  She does not smoke.   REVIEW OF SYSTEMS:  A 10-point comprehensive review of systems is  negative except as noted above.   PHYSICAL EXAMINATION:  VITAL SIGNS:  Weight 181 pounds, blood pressure  140/80, pulse 80, respiratory 20.  GENERAL:  The patient is a healthy African American female in no acute  distress.  HEENT:  Negative.  NECK:  Supple without thyromegaly.  There is no supraclavicular or  inguinal adenopathy.  ABDOMEN:  Soft, nontender.  No mass, organomegaly, ascites or hernias  noted.  PELVIC:  EG, BUS, vagina, bladder and urethra are normal but slightly  atrophic.  Cervix is parous, mobile, no lesions are noted.  Bimanual  exam reveals a retroverted uterus, upper limits of normal size.  There  are no adnexal masses noted.   IMPRESSION:  Grade 1 endometrial cancer.  I believe the patient could be  managed quite nicely with laparoscopic surgery.  The pros and cons of  open versus laparoscopic surgery and the need for total hysterectomy and  bilateral salpingo-oophorectomy and possible lymphadenectomy were  discussed with the patient and her family.  They were  given options to  have surgery at [facility] on November 23, 2006, or in [place], [place], on December 07, 2006.  After discussing this at some length,  patient and her family would like to have surgery on December 07, 2006,  in [place], [place].  We will coordinate this with Dr. Ned Clines.      Marti Sleigh, M.D.  Electronically Signed     DC/MEDQ  D:  11/10/2006  T:  11/11/2006  Job:  [id number]   cc:   Jonnie Kind, M.D.  Fax: [phone]   Caswell Corwin, R.N.  [address], [place] [address]   R. Garfield Cornea, M.D.  [address]  [place]  [place] [address]   Margaret E. Moshe Cipro, M.D.  Fax: [phone]

## 2010-08-05 NOTE — Consult Note (Signed)
NAME:  [name] [name], [name] [name]                  ACCOUNT NO.:  [id number]   MEDICAL RECORD NO.:  [id number]          PATIENT TYPE:  OUT   LOCATION:  GYN                          FACILITY:  [facility]   PHYSICIAN:  Paola A. Alycia Rossetti, MD    DATE OF BIRTH:  [birth year]   DATE OF CONSULTATION:  05/04/2007  DATE OF DISCHARGE:                                 CONSULTATION   REFERRING PHYSICIAN:  Norwood Levo. Moshe Cipro, M.D.   HISTORY:  Ms. Klos is a very pleasant 77 year old who underwent  laparoscopic hysterectomy, BSO, and bilateral pelvic lymph node sampling  December 14, 2006.  Final pathology was consistent with a stage IB  grade 1 endometrioid adenocarcinoma with less than 5% myometrial  invasion, 0/15 negative lymph nodes, no angiolymphatic invasion, and  negative washings.  She was disposition to close follow up.  She was  seen for her postoperative check in October, and was doing well.  She  comes in today for her initial evaluation.  She otherwise denies any  complaints.  She continues to have some problems with urge incontinence  which has been longstanding, but somewhat potentially worse after the  surgery.   REVIEW OF SYSTEMS:  She denies any chest pain short of breath, nausea,  vomiting, fevers, chills, headaches, or visual changes.  Her gaseous has  decreased, she denies any vaginal bleeding, any unintentional weight  loss or weight gain.  She would like to try to alternate visits between  ourselves and her primary physician, Dr. Moshe Cipro, whom she states does  do Pap smear.  She is seeing Dr. Moshe Cipro next Monday, and can address  this issue with her.  With regards to her urge continence, it appears to  be getting worse.  She is on Lasix for her blood pressure, and this is a  problem for her.  She does need to take that.  She otherwise denies any  complaints.   PHYSICAL EXAMINATION:  VITAL SIGNS:  Weight 180 pounds which is up 8  pounds.  GENERAL:  A well-nourished, alert female in no acute  distress.  NECK:  Supple.  There is there is no lymphadenopathy, no thyromegaly.  LUNGS:  Clear to auscultation bilaterally.  CARDIOVASCULAR EXAM:  Regular rate and rhythm.  ABDOMEN:  She has well-healed surgical incisions.  She has no incisional  hernias.  The abdomen is soft, nontender, nondistended.  There are no  palpable masses or hepatosplenomegaly.  Groins are negative for  adenopathy.  EXTREMITIES:  She has 1+ pitting edema equal bilaterally.  PELVIC/RECTAL:  External genitalia is within normal limits though  atrophic.  The vagina is atrophic as is the vaginal cuff.  The vaginal  cuff has no visible lesions.  A ThinPrep Pap was submitted without  difficulty.  Bimanual examination reveals no masses or nodularity.  Rectal confirms.   ASSESSMENT:  A 77 year old with a stage IB grade 1 endometrioid  adenocarcinoma who has no evidence of recurrent disease, and did not  require any postoperative therapy.   PLAN:  Will follow up the results for Pap smear from today.  She  will  see Dr. Moshe Cipro in 4 months and return to see [place] in 8 months.      Paola A. Alycia Rossetti, MD  Electronically Signed     PAG/MEDQ  D:  05/04/2007  T:  05/05/2007  Job:  [id number]   cc:   Caswell Corwin, R.N.  [address], [place] [address]   R. Garfield Cornea, M.D.  [address]  [place]   [address]   Margaret E. Moshe Cipro, M.D.  Fax: [phone]

## 2010-08-05 NOTE — Op Note (Signed)
NAME:  [name] [name], [name] [name]                  ACCOUNT NO.:  [id number]   MEDICAL RECORD NO.:  [id number]        PATIENT TYPE:  LOIB   LOCATION:                                FACILITY:  [facility]   PHYSICIAN:  Paola A. Alycia Rossetti, MD    DATE OF BIRTH:  11/21/1932   DATE OF PROCEDURE:  12/14/2006  DATE OF DISCHARGE:                               OPERATIVE REPORT   PREOPERATIVE DIAGNOSIS:  Grade 1 endometrioid adenocarcinoma.   POSTOPERATIVE DIAGNOSIS:  Grade 1 endometrioid adenocarcinoma.   PROCEDURE:  1. Total laparoscopic hysterectomy.  2. Bilateral salpingo-oophorectomy.  3. Right sided periaortic bilateral pelvic lymph node sampling.   SURGEON:  Nancy Marus MD.  Lahoma Crocker MD.   ASSISTANT:  Caswell Corwin, RN   ANESTHESIA:  General.   ANESTHESIOLOGIST:  Franne Grip, MD.   ESTIMATED BLOOD LOSS:  50 mL.   URINE OUTPUT:  200 mL.   IV FLUIDS:  2500 mL.   COMPLICATIONS:  None.   SPECIMENS:  Cervix, uterus, tubes, ovaries, washing bilateral pelvic,  right periaortic lymph nodes all to pathology.   OPERATIVE FINDINGS:  Included normal size uterus, bilateral tubes and  ovaries were otherwise unremarkable.  There is no significant abnormal  nodal tissue noted.   PROCEDURE IN DETAIL:  The patient was taken to the operating room and  placed in supine position.  Arms were tucked under her sides with all  appropriate precautions to her comfort level.  The patient then had  general anesthesia induced.  Once general anesthesia was induced she was  placed in a dorsal lithotomy position with all appropriate precautions  and the shoulder blocks were applied in th usual fashion, again with all  appropriate precautions being taken.  The abdomen was then prepped in  the usual sterile fashion.  The perineum was prepped in the usual  fashion and a Foley catheter was inserted into the bladder under sterile  conditions.  A time out was performed to confirm the patient, surgeons,  procedure,  antibiotics and allergy status.  A sterile speculum was  inserted into the vagina.  The cervix was identified with no obvious  abnormalities.  The cervix was then grasped with a single-tooth  tenaculum and the uterus sounded to 10 cm per the cervix and was dilated  without difficulty.  The ZUMI with a medium colpotomizer ring was  applied in the usual fashion.  The pneumo-occluder balloon was then  placed.  The patient was then draped in the usual sterile fashion.  Quarter-percent Marcaine was injected in the infraumbilical region.  A  transverse vertical infraumbilical incision was made with a knife and  carried down to the underlying fascia using Mayo scissors.  The fascia  was identified and grasped with Kocher clamps.  The Kocher clamps were  then used to tent the fascia and the fascia was then entered sharply.  Once intraperitoneal placement was secured, the fascial edges were  secured with a 0-Vicryl and UR6.  The abdomen was then insufflated with  CO2 gas.  At this point and at all points during the case, the  patient's  intraabdominal pressure did not exceed 15 mmHg.  Bilateral 5 mm port and  a 10/12 suprapubic port were placed in the usual fashion under direct  visualization.  The patient was then placed in deep Trendelenburg  position.  Our attention was first drawn to the periaortics on the  patient's right side.  Using monopolar cautery, a linear incision was  made over the common iliac artery on her right side and extended up  along the aorta.  The retroperitoneal space was opened.  The ureter was  identified.  A grasp was then placed to elevate the ureter and dissect  the nodal bundle free off of the psoas belly.  Extending along the  common iliac artery up to the level of aorta, the nodal bundle was  removed using pin-point cautery when needed and sharp dissection.  This  was continued along the length of the nodal bundle to the level of the  duodenum.  The vena cava  __________ was identified and the nodal bundle  was freed from the vena cava.  The nodal bundle was then delivered  through the 10/12 port.  The area was noted to be hemostatic.   Our attention was then drawn to the left pelvic side wall.  The  posterior leaf of the broad ligament was opened sharply with monopolar  cautery.  The ureter was identified and a window was made between the  ureter and the IP.  The IP was coagulated and transected with monopolar  cautery.  The anterior leaf of the broad ligament was then opened and  the bladder flap was created.  The uterine artery on the patient's left  side was skeletonized.  Using the cold colpotomizer ring as a guide, the  uterine artery on her left side was coagulated using bipolar cautery.  A  similar procedure was performed on the patient's right side.  Similarly,  the posterior leaf of the broad ligament was opened.  The ureter was  identified and a window was created between the IP and the ureter.  We  continued the anterior leaf of the broad ligament dissection to be  contiguous with that on the left side and the bladder flap was  completed.  The uterine artery on her right side was skeletized,  coagulated with bipolar cautery and then transected using monopolar  cautery.  The pneumo-occluder balloon was then insufflated.  We  transected the uterine artery on the patient's left side first.  The  loop was created and the colpotomy was used along the colpotomizer ring  as a guide.  This was continued in a circumferential fashion until the  specimen was detached from the vagina and was then delivered through the  vagina.  The vaginal cuff was then closed using 4-0 Vicryl figure-of-  eight sutures on the 0-Vicryl stitch.   Our attention was then drawn to performing a lymphadenectomy on the  patient's left side.  This perivesical space was opened by identifying  the superior vesicle.  We then opened the perirectal space.  The nodal   bundle extending over the external iliac artery was taken down using  sharp dissection and pin-point Bovie cautery.  This was continued to the  level of circumflex, iliac, to the level of the bifurcation.  The  external iliac vein was then identified and the nodal bundle was sharply  dissected off the external iliac vein.  The obturator nerve was seen in  the nodal bundle superior to the obturator nerve was  then taken down  again using sharp dissection and pin-point electrocautery.  The  remainder of the nodal bundle was then separated off the superior  vesicle.  A 10/12 EndoCatch was then placed in the 10/12 port.  The  nodal bundle was placed within the EndoCatch bag and delivered through  the 10/12 suprapubic port.  Identical procedure was performed on the  right side.  Of note, the obturator nerve, the ureter and the superior  vesicle were noted at multiple points during the case and were noted to  be intact and not injured during lymphadenectomy.  The abdomen and  pelvic were copiously irrigated.  All pedicles were identified and under  low flow. We then reinspected the area of the periaortic dissection and  it was also noted to be hemostatic under low flow.  The trocar was  removed under direct visualization.  The CO2 gas was removed from the  abdomen.  We reapproximated the 0-Vicryl sutures of the infraumbilical  port.  With reapproximation, the fascia was noted to be intact.  The  skin edges were closed using a 4-0 Vicryl subcu sutures, Steri-Strips  and benzoin were applied.   All needle, RayTec and instrument counts were correct x2.  The patient  tolerated the procedure well and was taken to the recovery in stable  condition with no obvious complications.      Paola A. Alycia Rossetti, MD  Electronically Signed     PAG/MEDQ  D:  12/14/2006  T:  12/15/2006  Job:  [id number]   cc:   Everlean Cherry. Delma Post, M.D.  Fax: [phone]   Jonnie Kind, M.D.  Fax: [phone]   R. Garfield Cornea,  M.D.  [address]  [place]  [place] [address]   Margaret E. Moshe Cipro, M.D.  Fax: [phone]   Caswell Corwin, R.N.  [address]  [place], [place] [address]   Agnes Lawrence, M.D.  Fax: [phone]

## 2010-08-05 NOTE — Consult Note (Signed)
NAME:  [name] [name], [name] [name]                  ACCOUNT NO.:  [id number]   MEDICAL RECORD NO.:  [id number]          PATIENT TYPE:  OUT   LOCATION:  GYN                          FACILITY:  [facility]   PHYSICIAN:  Paola A. Alycia Rossetti, MD    DATE OF BIRTH:  1933/02/16   DATE OF CONSULTATION:  01/11/2008  DATE OF DISCHARGE:                                 CONSULTATION   Ms. Gang is a very pleasant 77 year old who underwent laparoscopic  hysterectomy, BSO, bilateral pelvic lymph node sampling September of  2008.  Final pathology was consistent with a stage IB grade 1  endometrioid adenocarcinoma with less than 15% myometrial invasion and  15 negative pelvic lymph nodes.  She has been dispositioned to close  follow-up and has done quite well.  I last saw her in February of 2009  at which time her exam was negative as was her Pap smear.  She was seen  by Dr. Moshe Cipro in June, and she states her exam was negative as well.  She is overall doing quite well.   REVIEW OF SYSTEMS:  She did recently fall at church as she tripped on  her robe from singing in the choir.  She is having some low back pain  associated with that and has a follow-up with Dr. Moshe Cipro next week.  She feels that she may be referred to a specialist.  She otherwise  denies any change in bowel or bladder habits; any nausea, vomiting,  fevers, chills, headaches, visual changes, change in her bowel or  bladder habits, chest pain, shortness of breath, unintentional weight  loss or weight gain.   HEALTH MAINTENANCE:  She is due for a mammogram in November of 2009.   MEDICATION LIST:  Is reviewed and is unchanged.  Medications include  Maxzide and baby aspirin.   PHYSICAL EXAMINATION:  Weight 185 pounds which is up 5 pounds since last  visit, blood pressure 145/79, pulse 79, well-nourished, well-developed  female in no acute distress.  NECK:  Supple.  There is no lymphadenopathy, no thyromegaly.  LUNGS:  Are clear to auscultation bilaterally.  CARDIOVASCULAR EXAM:  Regular rate and rhythm.  ABDOMEN:  She has well-healed right upper quadrant incision from a  cholecystectomy.  She has well-healed laparoscopy skin incisions.  There  is no evidence of any incisional hernias.  ABDOMEN:  Is soft, nontender, nondistended.  There are no palpable  masses or hepatosplenomegaly.  Groins are negative for adenopathy.  EXTREMITIES:  There is no edema.  PELVIC:  External genitalia is within normal limits though markedly  atrophic.  She appears to have a grade 1 cystocele.  Vaginal cuff is  visualized.  There are no visible lesions.  ThinPrep Pap was submitted  without difficulty.  Bimanual examination reveals no masses or  nodularity.  Rectal confirms.   ASSESSMENT:  A 75-year with a stage IB grade 1 endometrioid  adenocarcinoma diagnosed and treated in September 2008 who has no  evidence recurrent disease.   PLAN:  Will follow the results of her Pap smear from today.  She will  see Dr.  Simpson in 6 months and return to see [place] in 1 year's time.  She  was given precautions for when we would need to be seeing her sooner,  namely pelvic pain or vaginal bleeding.      Paola A. Alycia Rossetti, MD  Electronically Signed     PAG/MEDQ  D:  01/11/2008  T:  01/11/2008  Job:  [id number]   cc:   Caswell Corwin, R.N.  [address], [place] [address]   R. Garfield Cornea, M.D.  [address]  [place]  [place] [address]   Margaret E. Moshe Cipro, M.D.  Fax: [phone]

## 2010-08-05 NOTE — Consult Note (Signed)
NAME:  DMIYA, MALPHRUS                  ACCOUNT NO.:  [id number]   MEDICAL RECORD NO.:  [id number]          PATIENT TYPE:  OUT   LOCATION:  GYN                          FACILITY:  [facility]   PHYSICIAN:  Paola A. Alycia Rossetti, MD    DATE OF BIRTH:  September 24, 1932   DATE OF CONSULTATION:  01/05/2007  DATE OF DISCHARGE:                                 CONSULTATION   The patient is a very pleasant 77 year old who underwent a total  laparoscopic hysterectomy, BSO, bilateral pelvic lymph node sampling and  right-sided periaortic lymph node sampling December 14, 2006, by myself  and Dr. Delsa Sale for a grade 1 endometrioid adenocarcinoma.  Final  pathology was consistent with a stage IB, grade 1 endometrioid  adenocarcinoma with less than 5% myometrial invasion, 0/15 lymph nodes  being positive, and no angiolymphatic invasion and negative washings.  She comes in today for her postoperative check.  She is overall doing  very well and denies any significant complaints.  She does complain of a  small amount of urgency and some gassiness but is otherwise doing very  well.  She would like to go on a trip with her sister to the mountains  in the near future.  She has had only minimal vaginal spotting.  Her  pain is well controlled.   PHYSICAL EXAMINATION:  Weight 180 pounds, blood pressure 144/79.  Well-nourished, well-developed female in no acute distress.  ABDOMEN:  Well-healed laparoscopy skin incisions.  Abdomen is soft and  nontender.  Groins are negative for adenopathy.  EXTREMITIES:  There is no edema.  PELVIC:  External genitalia is within normal limits.  The vagina is  atrophic.  The vaginal cuff is visualized.  Sutures are visible.  There  is no granulation tissue.  It is healing well.  Bimanual examination  reveals no masses, nodularity, fluctuance or tenderness.   ASSESSMENT:  A 77 year old with stage IB, grade 1 endometrioid  adenocarcinoma.  She has been dispositioned to close follow-up.  She  has  no angiolymphatic invasion, minimal depth of invasion, with a grade 1  tumor, so she requires no adjuvant postoperative therapy.   PLAN:  Return to see [place] in 4 months.  After that point we will start  alternating visits with Dr. Glo Herring.      Paola A. Alycia Rossetti, MD  Electronically Signed     PAG/MEDQ  D:  01/05/2007  T:  01/06/2007  Job:  [id number]   cc:   Jonnie Kind, M.D.  Fax: [phone]   Caswell Corwin, R.N.  [address], [place] [address]   R. Garfield Cornea, M.D.  [address]  [place]  [place] [address]   Margaret E. Moshe Cipro, M.D.  Fax: [phone]

## 2010-08-05 NOTE — Op Note (Signed)
NAME:  [name] [name], [name] [name]                  ACCOUNT NO.:  [id number]   MEDICAL RECORD NO.:  [id number]          PATIENT TYPE:  AMB   LOCATION:  DAY                           FACILITY:  [facility]   PHYSICIAN:  R. Garfield Cornea, M.D. DATE OF BIRTH:  Jun 27, 1932   DATE OF PROCEDURE:  06/21/2007  DATE OF DISCHARGE:                               OPERATIVE REPORT   INDICATIONS FOR PROCEDURE:  77 year old African-American female, history  of colonic polyps.  Last colonoscopy done by Dr. Tamala Julian back in 2003.  She has no lower GI tract symptoms and no family history of colorectal  neoplasia.  Colonoscopy is now being done as a surveillance maneuver.  This approach has discussed with the patient at length.  Potential  risks, benefits, alternatives and limitations have been reviewed,  questions answered.  She is agreeable.  Please see documentation in the  medical record.   PROCEDURE NOTE:  O2 saturation, blood pressure, pulse and respirations  were monitored throughout the entire procedure.   CONSCIOUS SEDATION:  Versed 4 mg IV, Demerol 75 mg IV divided doses,  Zofran 4 mg IV prior to the procedure to prevent postprocedure nausea.   INSTRUMENT:  Pentax video chip system.   FINDINGS:  Digital rectal exam revealed no abnormalities.   ENDOSCOPIC FINDINGS:  The prep was good.  Colon:  Colonic mucosa was surveyed from the rectosigmoid junction  through the left, transverse, right colon to area of appendiceal  orifice, ileocecal valve and cecum.  These structures were well seen  photographed for the record.  Terminal ileum was intubated 10 cm. From  this level scope was slowly and cautiously withdrawn.  All previously  mentioned mucosal surfaces were again seen.  The patient was noted have  a single diminutive polyp at the hepatic flexure which was cold  biopsied/removed.  The patient has sigmoid diverticula, remainder of  colonic mucosa appeared normal.  No abnormalities.  The patient  tolerated the  procedure well, was reacted in endoscopy.   IMPRESSION:  1. Normal rectum.  2. Sigmoid diverticula, diminutive hepatic flexure polyp status post      cold biopsy.  Remainder of colonic mucosa and terminal mucosa      appeared normal.   RECOMMENDATIONS:  1. Diverticulosis literature provided Ms. Arenson.  2. Follow-up on path.  3. Further recommendations to follow.      [name], M.D.  Electronically Signed     RMR/MEDQ  D:  06/21/2007  T:  06/21/2007  Job:  [id number]   cc:   Norwood Levo. Moshe Cipro, M.D.  Fax: [phone]

## 2010-08-05 NOTE — Assessment & Plan Note (Signed)
NAME:  [name] [name], [name] [name]                   CHART#:  [id number]   DATE:  10/15/2006                       DOB:  12/10/1932   PHYICIAN REQUESTING CONSULTATION:  [name]   REASON FOR CONSULTATION:  Elevated liver enzymes.   HISTORY OF PRESENT ILLNESS:  [name] [name] is a 77 year old African American  female who presents today for further evaluation of elevated LFTs.  She  states this was initially discovered on routine blood work in June of  this year.  Four weeks later she had her labs rechecked, and they were  still elevated.  Initially, her AST was 63, and ALT 40, and when  rechecked a month later was 59 and 51 respectively.  Her albumin normal  at 4.  Total bilirubin normal at 0.6, and alkaline phosphatase normal at  48.  She states that she was on lovastatin for approximately 1 week, but  she says this was several months ago.  She was not able to tolerate the  medication.  Otherwise, she has not been on any lipid lowering  medications.  She had an abdominal ultrasound on October 07, 2006, which  revealed the common bile duct measured 10 mm post cholecystectomy.  There was a fine linear echogenicity located in the common bile duct  lumen at the region of the head of the pancreas, but otherwise  unremarkable.  Etiology of this linear focus uncertain.  Did not have  the appearance of a mass.  She also had diffuse hepatobiliary disease,  possibly fatty infiltration.  In addition, she had bilateral renal  parenchymal atrophic changes, and the left kidney was larger than the  right kidney.   She is currently being evaluated for postmenopausal bleeding, and  underwent biopsy, but results are pending.   She denies any abdominal pain, nausea, vomiting, heartburn, dysphagia,  odynophagia, unintentional weight loss, constipation, diarrhea, melena,  or rectal bleeding.  She has lost about 15 pounds over the course of  several months, but this has been due to diet and exercise.   CURRENT  MEDICATIONS:  1. Maxzide daily.  2. Aspirin 81 mg 1 weekly.   ALLERGIES:  PENICILLIN.   PAST MEDICAL HISTORY:  1. Hypertension.  2. Hyperlipidemia.  3. Elevated liver enzymes.  4. Postmenopausal bleeding.  5. Colonoscopy 4 to 5 years ago by Dr. Irving Shows.  Per patient, she      had polyps, but she does not know when she was supposed to have a      repeat study.  6. She had a cholecystectomy for gallstones in the 1970s.  7. She has had a tonsillectomy.   FAMILY HISTORY:  Mother died due to a brain tumor.  She has a sister  with diabetes.  A brother died of cancer, she believes it was prostate  cancer.  No family history of colorectal cancer.  Another sibling died  due to an aneurysm.   SOCIAL HISTORY:  She is married and has 3 sons.  She is retired from  [employer].  She never smoked tobacco.  No alcohol use.   REVIEW OF SYSTEMS:  See HPI for GI, constitutional.  Cardiopulmonary:  No chest pain or shortness of breath.  See HPI for GU.   PHYSICAL EXAMINATION:  Weight 185.  Height 5 feet 6 inches.  Temperature  99.1.  Blood pressure 130/82.  Pulse 80.  GENERAL:  A pleasant, elderly, black female in no acute distress.  SKIN:  Warm and dry.  No jaundice.  HEENT:  Sclerae anicteric.  Oropharyngeal mucosa moist and pink.  No  lesions, erythema, or exudate.  No lymphadenopathy or thyromegaly.  CHEST:  Lungs clear to auscultation.  CARDIAC:  Exam reveals regular rate and rhythm.  Normal S1 and S2.  No  murmurs, rubs, or gallops.  ABDOMEN:  Positive bowel sounds.  Abdomen is obese, but symmetrical,  soft, and nontender.  No organomegaly or masses.  No rebound tenderness  or guarding.  No abdominal bruits or hernias.  EXTREMITIES:  No edema.   IMPRESSION:  [name] [name] is a 77 year old lady who has mildly elevated  transaminases.  This may be due to fatty infiltration of the liver.  She  has not actively been taking any lipid lowering medication in the last  few months, therefore,  this is less likely cause.  Interestingly, common  bile duct appears to have some linear echogenicity within the lumen of  unclear significance.  No obvious mass or stones.  We will need to  review the ultrasound to make further recommendations.   PLAN:  1. Review abdominal ultrasound with radiologist.  2. Retrieve hepatitis panel from Dr. Griffin Dakin office.  3. I have ask the patient to discuss with Dr. Moshe Cipro regarding her      need for colonoscopy.  According to the patient, Dr. Moshe Cipro has      records from her last colonoscopy, and if she had any adenomatous      polyps, she should have a follow-up 3 to 5 years from the last the      last exam .   I would like to thank Dr. [name] for allowing [place] to take part  in the care of this patient.       Neil Crouch, P.A.  Electronically Signed     R. Garfield Cornea, M.D.  Electronically Signed    LL/MEDQ  D:  10/15/2006  T:  10/16/2006  Job:  [id number]   cc:   Norwood Levo. Moshe Cipro, M.D.

## 2010-08-08 NOTE — H&P (Signed)
NAME:  [name] [name], [name] [name]                            ACCOUNT NO.:  [id number]   MEDICAL RECORD NO.:  [id number]                   PATIENT TYPE:  AMB   LOCATION:  DAY                                  FACILITY:  [facility]   PHYSICIAN:  Leane Para C. Tamala Julian, M.D.                DATE OF BIRTH:  02/25/1933   DATE OF ADMISSION:  DATE OF DISCHARGE:                                HISTORY & PHYSICAL   HISTORY OF PRESENT ILLNESS:  [age]-year-old female with a history of  growing masses of her face.  The patient has multiple small papillomas, but  she also has a large lesion on the right eyelid and she has 2 large  thickened friable lesions of each cheek.  Because of the size of the masses,  it is felt that these need to be excised with monitored anesthesia or  general anesthesia.   PAST HISTORY:  She has:  1. Hypertension.  2. Degenerative joint disease.  3. Hyperlipidemia.   MEDICATIONS:  1. Maxzide 75/50 mg daily.  2. Aspirin 1 daily.  3. Zoloft 50 mg daily.   PHYSICAL EXAMINATION:  VITAL SIGNS:  On exam, blood pressure 140/80, pulse  80, respirations 20.  HEENT:  Unremarkable.  Face shows extensive papillomas with a 1.5-cm mass of  the left cheek and a 1.75-cm mass of the infraorbital ridge and a 0.5 cm of  the right upper eyelid.  NECK:  Neck supple.  No JVD or bruit.  CHEST:  Chest clear to auscultation.  HEART:  Regular rate and rhythm without murmur, gallop or rub.  ABDOMEN:  Abdomen is soft, nontender.  No masses.  EXTREMITIES:  No cyanosis, clubbing or edema.   IMPRESSION:  1. Bilateral facial masses.  2. Hypertension.  3. Mild depression.   PLAN:  Plan excision of masses of face under anesthesia.     ___________________________________________                                         Vernon Prey. Tamala Julian, M.D.   LCS/MEDQ  D:  08/20/2003  T:  08/21/2003  Job:  [id number]

## 2010-08-08 NOTE — Op Note (Signed)
NAME:  [name] [name], [name] [name]                            ACCOUNT NO.:  [id number]   MEDICAL RECORD NO.:  [id number]                   PATIENT TYPE:  AMB   LOCATION:  DAY                                  FACILITY:  [facility]   PHYSICIAN:  Leane Para C. Tamala Julian, M.D.                DATE OF BIRTH:  08-07-1932   DATE OF PROCEDURE:  DATE OF DISCHARGE:                                 OPERATIVE REPORT   PREOPERATIVE DIAGNOSIS:  Multiple facial masses.   POSTOPERATIVE DIAGNOSES:  Mass left check 1.5 cm and mass right cheek 1.5  cm.   PROCEDURE:  Excision of mass of the left cheek 1.5 cm and excision of mass  of the right cheek 1.75 cm.   SURGEON:  Dr. Tamala Julian.   DESCRIPTION OF PROCEDURE:  Under general LMA anesthesia, the face was  prepped and draped in a sterile field.  Initial incision was made on the  left side.  A circumferential incision was made around the protruding soft  tissue mass on the left face above the, at the corner of the eye above the  outer aspect of the zygoma.  The incision extended down into the  subcutaneous fat.  The mass was excised.  Deep tissues were closed with 4-0  chromic and the skin was closed with interrupted 4-0 Prolene.  There were  multiple papilomas around the eye which were removed but these will not be  charged for.  The base of the papillomas were incised with scissors and  cauterized.  Next, attention was turned to the right face.  There was about  a 1.75 cm mass over the zygomatic arch beneath the eye.  Circumferential  incision was made down to the subcutaneous tissue.  The base was somewhat  bloody so it was cauterized.  It was closed in two layers with chromic and  with a 4-0 chromic and 4-0 Prolene.  A few cutaneous papillomas were excised  also but were not charged for.  The patient tolerated the procedure well.  Antibiotic cream was placed over the incisions around the eye.  She was  awakened from anesthesia, transferred to a bed and taken to the post  anesthetic  care unit for further monitoring.      ___________________________________________                                            Vernon Prey. Tamala Julian, M.D.   LCS/MEDQ  D:  08/21/2003  T:  08/21/2003  Job:  [id number]

## 2010-08-11 ENCOUNTER — Telehealth: Payer: Self-pay | Admitting: *Deleted

## 2010-08-11 DIAGNOSIS — E785 Hyperlipidemia, unspecified: Secondary | ICD-10-CM

## 2010-08-11 DIAGNOSIS — R7989 Other specified abnormal findings of blood chemistry: Secondary | ICD-10-CM

## 2010-08-11 DIAGNOSIS — R5383 Other fatigue: Secondary | ICD-10-CM

## 2010-08-11 DIAGNOSIS — I10 Essential (primary) hypertension: Secondary | ICD-10-CM

## 2010-08-11 DIAGNOSIS — K7689 Other specified diseases of liver: Secondary | ICD-10-CM

## 2010-08-11 DIAGNOSIS — R7301 Impaired fasting glucose: Secondary | ICD-10-CM

## 2010-08-11 LAB — HEPATIC FUNCTION PANEL
ALT: 60 U/L — ABNORMAL HIGH (ref 0–35)
AST: 76 U/L — ABNORMAL HIGH (ref 0–37)
Albumin: 4.1 g/dL (ref 3.5–5.2)
Alkaline Phosphatase: 44 U/L (ref 39–117)
Bilirubin, Direct: 0.2 mg/dL (ref 0.0–0.3)
Indirect Bilirubin: 0.6 mg/dL (ref 0.0–0.9)
Total Bilirubin: 0.8 mg/dL (ref 0.3–1.2)
Total Protein: 6.7 g/dL (ref 6.0–8.3)

## 2010-08-11 LAB — CBC WITH DIFFERENTIAL/PLATELET
Basophils Absolute: 0 10*3/uL (ref 0.0–0.1)
Basophils Relative: 0 % (ref 0–1)
Eosinophils Absolute: 0.1 10*3/uL (ref 0.0–0.7)
Eosinophils Relative: 2 % (ref 0–5)
HCT: 38.7 % (ref 36.0–46.0)
Hemoglobin: 12.5 g/dL (ref 12.0–15.0)
Lymphocytes Relative: 43 % (ref 12–46)
Lymphs Abs: 2.7 10*3/uL (ref 0.7–4.0)
MCH: 29.1 pg (ref 26.0–34.0)
MCHC: 32.3 g/dL (ref 30.0–36.0)
MCV: 90.2 fL (ref 78.0–100.0)
Monocytes Absolute: 0.4 10*3/uL (ref 0.1–1.0)
Monocytes Relative: 7 % (ref 3–12)
Neutro Abs: 3 10*3/uL (ref 1.7–7.7)
Neutrophils Relative %: 48 % (ref 43–77)
Platelets: 177 10*3/uL (ref 150–400)
RBC: 4.29 MIL/uL (ref 3.87–5.11)
RDW: 13.4 % (ref 11.5–15.5)
WBC: 6.2 10*3/uL (ref 4.0–10.5)

## 2010-08-11 LAB — LIPID PANEL: Cholesterol: 239 mg/dL — ABNORMAL HIGH (ref 0–200)

## 2010-08-11 LAB — BASIC METABOLIC PANEL
BUN: 21 mg/dL (ref 6–23)
Calcium: 9.9 mg/dL (ref 8.4–10.5)
Glucose, Bld: 95 mg/dL (ref 70–99)

## 2010-08-11 NOTE — Telephone Encounter (Signed)
Lab requesting updated order, faxed as requested

## 2010-08-14 ENCOUNTER — Encounter: Payer: Self-pay | Admitting: Family Medicine

## 2010-08-15 ENCOUNTER — Encounter: Payer: Self-pay | Admitting: Family Medicine

## 2010-08-15 ENCOUNTER — Ambulatory Visit (INDEPENDENT_AMBULATORY_CARE_PROVIDER_SITE_OTHER): Payer: Medicare HMO | Admitting: Family Medicine

## 2010-08-15 ENCOUNTER — Other Ambulatory Visit (HOSPITAL_COMMUNITY)
Admission: RE | Admit: 2010-08-15 | Discharge: 2010-08-15 | Disposition: A | Discharge status: Home / Self Care | Payer: Medicare HMO | Source: Ambulatory Visit | Attending: Family Medicine | Admitting: Family Medicine

## 2010-08-15 VITALS — BP 120/74 | HR 79 | Resp 16 | Ht 65.0 in | Wt 186.0 lb

## 2010-08-15 DIAGNOSIS — Z1211 Encounter for screening for malignant neoplasm of colon: Secondary | ICD-10-CM

## 2010-08-15 DIAGNOSIS — N3000 Acute cystitis without hematuria: Secondary | ICD-10-CM

## 2010-08-15 DIAGNOSIS — Z124 Encounter for screening for malignant neoplasm of cervix: Secondary | ICD-10-CM

## 2010-08-15 DIAGNOSIS — C55 Malignant neoplasm of uterus, part unspecified: Secondary | ICD-10-CM

## 2010-08-15 DIAGNOSIS — Z23 Encounter for immunization: Secondary | ICD-10-CM

## 2010-08-15 DIAGNOSIS — Z0000 Encounter for general adult medical examination without abnormal findings: Secondary | ICD-10-CM

## 2010-08-15 DIAGNOSIS — R7402 Elevation of levels of lactic acid dehydrogenase (LDH): Secondary | ICD-10-CM

## 2010-08-15 DIAGNOSIS — K7689 Other specified diseases of liver: Secondary | ICD-10-CM

## 2010-08-15 DIAGNOSIS — M1710 Unilateral primary osteoarthritis, unspecified knee: Secondary | ICD-10-CM

## 2010-08-15 DIAGNOSIS — R7301 Impaired fasting glucose: Secondary | ICD-10-CM

## 2010-08-15 DIAGNOSIS — IMO0002 Reserved for concepts with insufficient information to code with codable children: Secondary | ICD-10-CM

## 2010-08-15 DIAGNOSIS — E785 Hyperlipidemia, unspecified: Secondary | ICD-10-CM

## 2010-08-15 DIAGNOSIS — I10 Essential (primary) hypertension: Secondary | ICD-10-CM

## 2010-08-15 LAB — POCT URINALYSIS DIPSTICK
Bilirubin, UA: NEGATIVE
Blood, UA: NEGATIVE
Nitrite, UA: POSITIVE
Spec Grav, UA: 1.02
pH, UA: 6.5

## 2010-08-15 LAB — POC HEMOCCULT BLD/STL (OFFICE/1-CARD/DIAGNOSTIC): Fecal Occult Blood, POC: NEGATIVE

## 2010-08-15 MED ORDER — SULFAMETHOXAZOLE-TRIMETHOPRIM 800-160 MG PO TABS: 1.0000 | ORAL_TABLET | Freq: Two times a day (BID) | ORAL | Status: AC

## 2010-08-15 NOTE — Patient Instructions (Signed)
F/u in 4 months.  Pneumonia vaccine today.  Ensure you drink at least 64 ounces water daily, and reduce salt, sodas, and carbohydrates in your diet. Eat more natural foods from the ground, fruit and veg.  If your urine looks infected , med will be sent for infection.  You will be referred for physical therapy twice weekly.  Will hold off zetia as discussed.  Fasting lipid, cem7 and HBa1C in 4 months.  It is important that you exercise regularly at least 30 minutes 5 times a week. If you develop chest pain, have severe difficulty breathing, or feel very tired, stop exercising immediately and seek medical attention  A healthy diet is rich in fruit, vegetables and whole grains. Poultry fish, nuts and beans are a healthy choice for protein rather then red meat. A low sodium diet and drinking 64 ounces of water daily is generally recommended. Oils and sweet should be limited. Carbohydrates especially for those who are diabetic or overweight, should be limited to 34-45 gram per meal. It is important to eat on a regular schedule, at least 3 times daily. Snacks should be primarily fruits, vegetables or nuts.

## 2010-08-15 NOTE — Assessment & Plan Note (Addendum)
Deteriorated, ortho does not recommend surgery at this time Pt referred for therapy, and weight loss encouraged

## 2010-08-16 NOTE — Assessment & Plan Note (Signed)
Controlled, no change in medication  

## 2010-08-16 NOTE — Assessment & Plan Note (Signed)
detreiorated , weight loss encouraged

## 2010-08-16 NOTE — Assessment & Plan Note (Signed)
Pap sent

## 2010-08-16 NOTE — Assessment & Plan Note (Signed)
uncontrolled  and deteriorated. Aggressive change in diet discussed, pt has fatty liver and is statin intolerant. Concern voiced over zetia and liver disease, decided against resuming the drug at this time espescialy in light of worsening enzymes. Pt to see GI again in the Fall

## 2010-08-16 NOTE — Progress Notes (Signed)
  Subjective:    Patient ID: [name] [name], female    DOB: May 06, 1932, 77 y.o.   MRN: [id number]  HPI The PT is here for annual exam  and re-evaluation of chronic medical conditions, medication management and review of recent lab and radiology data.  Preventive health is updated, specifically  Cancer screening,  and Immunization.   Questions or concerns regarding consultations or procedures which the PT has had in the interim are  addressed. The PT denies any adverse reactions to current medications since the last visit.  There are no new concerns.  She continues to suffer from significant knee pain and swelling, has been evaluated by ortho recently, and surgery is not indicated. Pt has not been as ddligent with diet , and her lipids and liver enzymes have increased, as has her HBA1C. She intends to reverse this       Review of Systems Denies recent fever or chills. Denies sinus pressure, nasal congestion, ear pain or sore throat. Denies chest congestion, productive cough or wheezing. Denies chest pains, palpitations, paroxysmal nocturnal dyspnea, orthopnea and leg swelling Denies abdominal pain, nausea, vomiting,diarrhea or constipation.  Denies rectal bleeding or change in bowel movement. C/o malodorous urine with urge incontinence. Keigel's discussed and specimen tested. Denies headaches, seizure, numbness, or tingling. Denies depression, anxiety or insomnia. Denies skin break down or rash.        Objective:   Physical Exam Pleasant well nourished female, alert and oriented x 3, in no cardio-pulmonary distress. Afebrile. HEENT No facial trauma or asymetry.   EOMI, PERTL, fundoscopic exam is normal, no hemorhage or exudate. No papiledema External ears normal, tympanic membranes clear. Oropharynx moist, no exudate, good dentition. Neck: supple, no adenopathy,JVD or thyromegaly.No bruits.  Chest: Clear to ascultation bilaterally.No crackles or wheezes. Non tender to  palpation  Breast: No asymetry,no masses. No nipple discharge or inversion. No axillary or supraclavicular adenopathy  Cardiovascular system; Heart sounds normal,  S1 and  S2 ,no S3.  No murmur, or thrill. Apical beat not displaced Peripheral pulses normal.  Abdomen: Soft, non tender, no organomegaly or masses. No bruits. Bowel sounds normal. No guarding, tenderness or rebound.  Rectal:  No mass. guaic negative stool.  GU: External genitalia normal. No lesions. Vaginal canal normal.No discharge. Uterusabsent, no adnexal masses, no cervical motion or adnexal tenderness.  Musculoskeletal exam: Full ROM of spine, hips , shoulders No deformity ,swelling or crepitus noted.Left knee swollen, with reduced ROM and crepitus. No muscle wasting or atrophy.   Neurologic: Cranial nerves 2 to 12 intact. Power, tone ,sensation and reflexes normal throughout. No disturbance in gait. No tremor.  Skin: Intact, no ulceration, erythema , scaling or rash noted. Pigmentation normal throughout  Psych; Normal mood and affect. Judgement and concentration normal        Assessment & Plan:

## 2010-08-16 NOTE — Assessment & Plan Note (Signed)
Deteriorated, lifestyle change encouraged and discussed to prevent the dev of diabetes

## 2010-08-18 LAB — URINE CULTURE: Colony Count: >=100000

## 2010-08-19 ENCOUNTER — Other Ambulatory Visit: Payer: Self-pay | Admitting: Family Medicine

## 2010-08-19 MED ORDER — LEVOFLOXACIN 500 MG PO TABS: 500.0000 mg | ORAL_TABLET | Freq: Every day | ORAL | Status: DC

## 2010-08-22 NOTE — Assessment & Plan Note (Signed)
Unchanged, enzymes higher at recent check , pt to see GI in the fall

## 2010-09-01 ENCOUNTER — Ambulatory Visit (HOSPITAL_COMMUNITY)
Admission: RE | Admit: 2010-09-01 | Discharge: 2010-09-01 | Disposition: A | Discharge status: Home / Self Care | Payer: Medicare HMO | Source: Ambulatory Visit | Attending: Family Medicine | Admitting: Family Medicine

## 2010-09-01 DIAGNOSIS — M6281 Muscle weakness (generalized): Secondary | ICD-10-CM | POA: Insufficient documentation

## 2010-09-01 DIAGNOSIS — R262 Difficulty in walking, not elsewhere classified: Secondary | ICD-10-CM | POA: Insufficient documentation

## 2010-09-01 DIAGNOSIS — I10 Essential (primary) hypertension: Secondary | ICD-10-CM | POA: Insufficient documentation

## 2010-09-01 DIAGNOSIS — IMO0001 Reserved for inherently not codable concepts without codable children: Secondary | ICD-10-CM | POA: Insufficient documentation

## 2010-09-01 DIAGNOSIS — M25569 Pain in unspecified knee: Secondary | ICD-10-CM | POA: Insufficient documentation

## 2010-09-01 DIAGNOSIS — E785 Hyperlipidemia, unspecified: Secondary | ICD-10-CM | POA: Insufficient documentation

## 2010-09-01 DIAGNOSIS — M25669 Stiffness of unspecified knee, not elsewhere classified: Secondary | ICD-10-CM | POA: Insufficient documentation

## 2010-09-08 ENCOUNTER — Ambulatory Visit (HOSPITAL_COMMUNITY)
Admission: RE | Admit: 2010-09-08 | Discharge: 2010-09-08 | Disposition: A | Discharge status: Home / Self Care | Payer: Medicare HMO | Source: Ambulatory Visit | Attending: Family Medicine | Admitting: Family Medicine

## 2010-09-11 ENCOUNTER — Ambulatory Visit (HOSPITAL_COMMUNITY)
Admission: RE | Admit: 2010-09-11 | Discharge: 2010-09-11 | Disposition: A | Discharge status: Home / Self Care | Payer: Medicare HMO | Source: Ambulatory Visit | Attending: Family Medicine | Admitting: Family Medicine

## 2010-09-15 ENCOUNTER — Ambulatory Visit (HOSPITAL_COMMUNITY)
Admission: RE | Admit: 2010-09-15 | Discharge: 2010-09-15 | Disposition: A | Discharge status: Home / Self Care | Payer: Medicare HMO | Source: Ambulatory Visit | Attending: Family Medicine | Admitting: Family Medicine

## 2010-09-18 ENCOUNTER — Ambulatory Visit (HOSPITAL_COMMUNITY)
Admission: RE | Admit: 2010-09-18 | Discharge: 2010-09-18 | Disposition: A | Discharge status: Home / Self Care | Payer: Medicare HMO | Source: Ambulatory Visit | Attending: Family Medicine | Admitting: Family Medicine

## 2010-09-25 ENCOUNTER — Ambulatory Visit (HOSPITAL_COMMUNITY)
Admission: RE | Admit: 2010-09-25 | Discharge: 2010-09-25 | Disposition: A | Discharge status: Home / Self Care | Payer: Medicare HMO | Source: Ambulatory Visit | Attending: Family Medicine | Admitting: Family Medicine

## 2010-09-25 DIAGNOSIS — I10 Essential (primary) hypertension: Secondary | ICD-10-CM | POA: Insufficient documentation

## 2010-09-25 DIAGNOSIS — M25569 Pain in unspecified knee: Secondary | ICD-10-CM | POA: Insufficient documentation

## 2010-09-25 DIAGNOSIS — R262 Difficulty in walking, not elsewhere classified: Secondary | ICD-10-CM | POA: Insufficient documentation

## 2010-09-25 DIAGNOSIS — IMO0001 Reserved for inherently not codable concepts without codable children: Secondary | ICD-10-CM | POA: Insufficient documentation

## 2010-09-25 DIAGNOSIS — M25669 Stiffness of unspecified knee, not elsewhere classified: Secondary | ICD-10-CM | POA: Insufficient documentation

## 2010-09-25 DIAGNOSIS — M6281 Muscle weakness (generalized): Secondary | ICD-10-CM | POA: Insufficient documentation

## 2010-09-25 DIAGNOSIS — E785 Hyperlipidemia, unspecified: Secondary | ICD-10-CM | POA: Insufficient documentation

## 2010-09-26 ENCOUNTER — Ambulatory Visit (HOSPITAL_COMMUNITY): Payer: Medicare HMO | Admitting: Physical Therapy

## 2010-10-01 ENCOUNTER — Ambulatory Visit (HOSPITAL_COMMUNITY)
Admission: RE | Admit: 2010-10-01 | Discharge: 2010-10-01 | Disposition: A | Discharge status: Home / Self Care | Payer: Medicare HMO | Source: Ambulatory Visit | Attending: Family Medicine | Admitting: Family Medicine

## 2010-10-01 DIAGNOSIS — M6281 Muscle weakness (generalized): Secondary | ICD-10-CM | POA: Insufficient documentation

## 2010-10-01 DIAGNOSIS — M25569 Pain in unspecified knee: Secondary | ICD-10-CM | POA: Insufficient documentation

## 2010-10-01 NOTE — Progress Notes (Signed)
Physical Therapy Treatment Note  Patient Name: [name] [name] KGYJE'H Date: 10/01/2010  Time In: 9:30 Time Out: 10:00  Subjective: no pain just stiffness Pt stated independent with HEP.  When asked pain scale she currently was 0/10 just stiff, worst: 6-7/10 following long duration of standing/walking.  Pt able to ambulate 15 min slowly.  Able to sit for 1 hr.  Stand for 10 -15 min.     Objective:   Left Right  Quad  4+/5 (4/5) 4+/5 (3+/5)  Hamstring 4+/5 (4+/5) 4+/5 (4+/5)  Hip flexion 3+/5 (3/5) 3/5 (3/5)  Glut Med 4/5 (2+/5) 3/5 (2+/5)  Adductors 2+/5 (2/5) 2+/5 (2/5)  Glut Max 3/5 (3/5) 3+/5 (3/5)   5 Sit to stand: 15.9  (17.2 on 09/01/2010) SLS L 9 seconds max of 3 attempts, R 4 seconds max.  Exercises/Treatments: MMT complete   Assessment: Re-eval complete per 30 day PT practice act.  Pt with improving strength, decreased pain and overall functional abilities.  Pt continues with LE weakness, balance and still amb with SPC.   Plan: Continue PT per initial evaluation.   Charges: Extremity MMT x 25 min   Ihor Austin, PTA     Aldona Lento 10/01/2010, 6:38 PM

## 2010-10-03 ENCOUNTER — Telehealth (HOSPITAL_COMMUNITY): Payer: Self-pay

## 2010-10-03 ENCOUNTER — Inpatient Hospital Stay (HOSPITAL_COMMUNITY): Admission: RE | Admit: 2010-10-03 | Payer: Medicare HMO | Source: Ambulatory Visit

## 2010-10-08 ENCOUNTER — Ambulatory Visit (HOSPITAL_COMMUNITY)
Admission: RE | Admit: 2010-10-08 | Discharge: 2010-10-08 | Disposition: A | Discharge status: Home / Self Care | Payer: Medicare HMO | Source: Ambulatory Visit | Attending: Family Medicine | Admitting: Family Medicine

## 2010-10-08 DIAGNOSIS — M25569 Pain in unspecified knee: Secondary | ICD-10-CM

## 2010-10-08 DIAGNOSIS — M6281 Muscle weakness (generalized): Secondary | ICD-10-CM

## 2010-10-08 NOTE — Progress Notes (Signed)
Physical Therapy Treatment Patient Name: [name] [name] GTXMI'W Date: 10/08/2010  Time in:  9:38 am Time out:  10:09am Visits #: 8/8; 1 of 8 from new re-eval on 10/01/10 Charges: therex 25 minutes  Next re-eval due date:  10/29/10  SUBJECTIVE: Symptoms/Limitations Symptoms: Pt reports no real pain, just stiffness in her knees and weakness. Pain Assessment Currently in Pain?: No/denies  Precautions/Restrictions :  None noted        OBJECTIVE: Exercise/Treatments: Warm-up:   Recumbent bike 6'@2 .5 seat on 8     Standing: SLS:  Max R 7", Max L 17"       Squats 15X       Step ups Fwd 4"15X bilaterally       Step ups Lat 4"  15X bilaterally       Step downs 4"  L only 10X (R too painful)       Tband for Hip Abd/Add/Ext 10X bilaterally       Retro Gait 2RT       Tandem Gait 2RT       Balance Beam 2RT       Heel walk 1RT       Toe walk 1RT   Goals PT Short Term Goals Short Term Goal 1: Pt. will be independent with HEP in order to maximize therapeutic effect. Short Term Goal 1 Progress: Met Short Term Goal 2: Pt will report pain less than 2/10 when ambulating for 15 minutes. Short Term Goal 2 Progress: Progressing toward goal Short Term Goal 3: Pt will demonstrate tandem stance X10 seconds. Short Term Goal 3 Progress: Met Short Term Goal 4: Pt will increase BLE muscle strength by 1 grade. Short Term Goal 4 Progress: Partly met PT Long Term Goals Long Term Goal 1: Pt will improve hip to WNL in order to tolerate standing for 1 hour to complete necessary ADL's Long Term Goal 1 Progress: Not met Long Term Goal 2: Pt. will demonstrate 5 sit to stands in 15 seconds without UE support in order to improve power with activities including squatting and stair climbling. Long Term Goal 2 Progress: Not met Long Term Goal 3: Pt. will report pain less than or equal to 3/10 for 75% of her day for improved quality of life. Long Term Goal 3 Progress: Partly met Long Term Goal 4: Pt. will perform  single leg balance X15 seconds on static surface to improve safety with ambulation. Long Term Goal 4 Progress: Not met  PT - End of Session Activity Tolerance: Patient tolerated treatment well General Behavior During Session: [facility] for tasks performed Cognition: [facility] for tasks performed PT Assessment and Plan Clinical Impression Statement: Pt able to complete all therex today; VC's/tactile cues needed to perform tband and step exercises correctly without substitution.   Rehab Potential: Good PT Frequency: Min 2X/week PT Duration: 6 weeks PT Treatment/Interventions: Balance training;Therapeutic exercise PT Plan: Progress LE strength and balance.  Add cybex quad/ham for strengthening, sit to stands and tandem stance next visit.   Mare Ferrari, Amy B 10/08/2010, 10:44 AM

## 2010-10-10 ENCOUNTER — Ambulatory Visit (HOSPITAL_COMMUNITY)
Admission: RE | Admit: 2010-10-10 | Discharge: 2010-10-10 | Disposition: A | Discharge status: Home / Self Care | Payer: Medicare HMO | Source: Ambulatory Visit | Attending: Family Medicine | Admitting: Family Medicine

## 2010-10-10 DIAGNOSIS — M25569 Pain in unspecified knee: Secondary | ICD-10-CM

## 2010-10-10 DIAGNOSIS — M6281 Muscle weakness (generalized): Secondary | ICD-10-CM

## 2010-10-10 NOTE — Patient Instructions (Addendum)
Educated patient on appropriate mechanics of the knee.  Educated patient her pain should not go above a 3/10 with exercise.

## 2010-10-10 NOTE — Progress Notes (Signed)
Physical Therapy Treatment Patient Name: [name] [name] Date: 10/10/2010 Time: 841-282 Charges: 47 min TE Visits #: 9/9; 2 of 8 from re-eval on  HPI: Symptoms/Limitations Symptoms: "I was running around a lot yesterday, driving, sitting, walking, without any pain. main complaint is stiffness to her knees and difficulty going up and down stairs." How long can you walk comfortably?: 15-20 minutes without pain.  Pain Assessment Currently in Pain?: No/denies  Exercise/Treatments Warm-up: Recumbent bike 6'@2 .5 seat on 8  Standing: SLS: Max R 7", Max L 17"  Squats 15X  Step ups Fwd 4"RLE - pain 6/10 at end 2x6, LLE: 2x10 - TC's for knee position Step ups Lat 4" 2X10 bilaterally   Step downs 2" R 10x - TC's for knee position Step downs 4" LLE 2x10 Tband for Hip Abd/Add/Ext 10X bilaterally  Retro Gait 2RT  Tandem Gait 2RT  Balance Beam 2RT  Heel walk 1RT  Toe walk 1RT Side stepping with Blue Tband 2x5 w/visual cueing for foot placement to improve mechanics Tandem Stance 3x.  Best time R: 12 sec, L: 11 sec. SEATED cybex quad: 1 PL 10x Cybex ham: 1.5 PL 1-x   5 sit to stands 16.9 sec w/UE support, 5 STS w/o UE support 18.6 sec.   Goals PT Short Term Goals Short Term Goal 1 Progress: Met Short Term Goal 2 Progress: Met Short Term Goal 3 Progress: Met Short Term Goal 4 Progress: Progressing toward goal PT Long Term Goals Long Term Goal 1 Progress: Progressing toward goal Long Term Goal 2 Progress: Progressing toward goal Long Term Goal 3 Progress: Progressing toward goal Long Term Goal 4 Progress: Not met End of Session Patient Active Problem List  Diagnoses  . MALIGNANT NEOPLASM OF UTERUS PART UNSPECIFIED  . HYPERLIPIDEMIA  . OVERWEIGHT  . HYPERTENSION  . OTHER CHRONIC NONALCOHOLIC LIVER DISEASE  . KNEE, ARTHRITIS, DEGEN./OSTEO  . Pain in joint, lower leg  . BACK PAIN  . Edema  . IMPAIRED FASTING GLUCOSE  . TRANSAMINASES, SERUM, ELEVATED  . FERRITIN, ELEVATED  .  LIVER FUNCTION TESTS, ABNORMAL, HX OF  . RESTLESS LEG SYNDROME, HX OF  . UNSPECIFIED VISUAL LOSS  . Pain in joint, lower leg  . Muscle weakness (generalized)   PT - End of Session Activity Tolerance: Patient tolerated treatment well PT Assessment and Plan Clinical Impression Statement: Pt had increased pain with stair activities, but was able to tolerate new ther-ex well.  Pt has improved power with sit to stands and improved balance overall.  Pt required TC's for appropriate LE alignment.  PT Frequency: Min 1X/week PT Plan: Continue to progress and work on appropriate mechanics for the knee.  Will see 1x/wk to see how she handles her HEP on her own.    Sowmya Partridge 10/10/2010, 10:00 AM

## 2010-10-13 ENCOUNTER — Ambulatory Visit (HOSPITAL_COMMUNITY): Payer: Medicare HMO

## 2010-12-16 ENCOUNTER — Ambulatory Visit: Payer: Medicare HMO | Admitting: Family Medicine

## 2010-12-18 ENCOUNTER — Other Ambulatory Visit: Payer: Self-pay | Admitting: Family Medicine

## 2010-12-25 ENCOUNTER — Encounter: Payer: Self-pay | Admitting: Family Medicine

## 2010-12-27 LAB — BASIC METABOLIC PANEL
CO2: 25 mEq/L (ref 19–32)
Glucose, Bld: 96 mg/dL (ref 70–99)
Potassium: 4.1 mEq/L (ref 3.5–5.3)
Sodium: 144 mEq/L (ref 135–145)

## 2010-12-27 LAB — HEMOGLOBIN A1C: Hgb A1c MFr Bld: 6.1 % — ABNORMAL HIGH (ref <5.7)

## 2010-12-27 LAB — LIPID PANEL
Total CHOL/HDL Ratio: 3.4 Ratio
VLDL: 16 mg/dL (ref 0–40)

## 2010-12-27 LAB — HEPATIC FUNCTION PANEL
Bilirubin, Direct: 0.2 mg/dL (ref 0.0–0.3)
Total Bilirubin: 0.7 mg/dL (ref 0.3–1.2)

## 2010-12-31 ENCOUNTER — Encounter: Payer: Self-pay | Admitting: Family Medicine

## 2010-12-31 ENCOUNTER — Ambulatory Visit (INDEPENDENT_AMBULATORY_CARE_PROVIDER_SITE_OTHER): Payer: Medicare HMO | Admitting: Family Medicine

## 2010-12-31 VITALS — BP 130/70 | HR 86 | Resp 16 | Ht 65.0 in | Wt 179.1 lb

## 2010-12-31 DIAGNOSIS — R7401 Elevation of levels of liver transaminase levels: Secondary | ICD-10-CM

## 2010-12-31 DIAGNOSIS — R7309 Other abnormal glucose: Secondary | ICD-10-CM

## 2010-12-31 DIAGNOSIS — R7301 Impaired fasting glucose: Secondary | ICD-10-CM

## 2010-12-31 DIAGNOSIS — R7303 Prediabetes: Secondary | ICD-10-CM

## 2010-12-31 DIAGNOSIS — N318 Other neuromuscular dysfunction of bladder: Secondary | ICD-10-CM

## 2010-12-31 DIAGNOSIS — IMO0002 Reserved for concepts with insufficient information to code with codable children: Secondary | ICD-10-CM

## 2010-12-31 DIAGNOSIS — N3090 Cystitis, unspecified without hematuria: Secondary | ICD-10-CM

## 2010-12-31 DIAGNOSIS — N3281 Overactive bladder: Secondary | ICD-10-CM

## 2010-12-31 DIAGNOSIS — I10 Essential (primary) hypertension: Secondary | ICD-10-CM

## 2010-12-31 DIAGNOSIS — M1710 Unilateral primary osteoarthritis, unspecified knee: Secondary | ICD-10-CM

## 2010-12-31 DIAGNOSIS — E785 Hyperlipidemia, unspecified: Secondary | ICD-10-CM

## 2010-12-31 DIAGNOSIS — N390 Urinary tract infection, site not specified: Secondary | ICD-10-CM

## 2010-12-31 DIAGNOSIS — E663 Overweight: Secondary | ICD-10-CM

## 2010-12-31 MED ORDER — NITROFURANTOIN MACROCRYSTAL 100 MG PO CAPS: 100.0000 mg | ORAL_CAPSULE | Freq: Two times a day (BID) | ORAL | Status: AC

## 2010-12-31 NOTE — Progress Notes (Signed)
  Subjective:    Patient ID: [name] [name], female    DOB: October 14, 1932, 77 y.o.   MRN: [id number]  HPI Malodorous urine with incontinence.  Increased knee pain and instability wants to hold on surgery at this time. Reports that she has continued to follow reduced carb intake, surprised at the weight loss, but pleased, also her HBA1C is lessened  Review of Systems See HPI Denies recent fever or chills. Denies sinus pressure, nasal congestion, ear pain or sore throat. Denies chest congestion, productive cough or wheezing. Denies chest pains, palpitations and leg swelling Denies abdominal pain, nausea, vomiting,diarrhea or constipation.    Denies headaches, seizures, numbness, or tingling. Denies depression, anxiety or insomnia. Denies skin break down or rash.        Objective:   Physical Exam Patient alert and oriented and in no cardiopulmonary distress.  HEENT: No facial asymmetry, EOMI, no sinus tenderness,  oropharynx pink and moist.  Neck supple no adenopathy.  Chest: Clear to auscultation bilaterally.  CVS: S1, S2 no murmurs, no S3.  ABD: Soft non tender. Bowel sounds normal.  Ext: No edema  MS: Adequate ROM spine, shoulders, hips and reduced in  knees.  Skin: Intact, no ulcerations or rash noted.  Psych: Good eye contact, normal affect. Memory intact not anxious or depressed appearing.  CNS: CN 2-12 intact, power, tone and sensation normal throughout.        Assessment & Plan:

## 2010-12-31 NOTE — Patient Instructions (Addendum)
F/u in 4.5 months.  Labs are better , continue weight loss.  Your urinary problems will be handled as discussed.  Pls ensure you drink at least 64 ounces of water daily.  Liver enzymes and blood sugar has improved, blood pressure is excellent.  Be careful not to fall!

## 2011-01-01 DIAGNOSIS — N3281 Overactive bladder: Secondary | ICD-10-CM | POA: Insufficient documentation

## 2011-01-01 DIAGNOSIS — N3000 Acute cystitis without hematuria: Secondary | ICD-10-CM | POA: Insufficient documentation

## 2011-01-01 LAB — COMPREHENSIVE METABOLIC PANEL
Albumin: 3.5
BUN: 13
Creatinine, Ser: 0.89
Total Protein: 7.2

## 2011-01-01 LAB — CBC
HCT: 31.4 — ABNORMAL LOW
HCT: 38.6
Hemoglobin: 10.9 — ABNORMAL LOW
MCV: 88.5
MCV: 89.5
Platelets: 176
RDW: 13.1
RDW: 13.4
WBC: 8.1

## 2011-01-01 LAB — DIFFERENTIAL
Lymphocytes Relative: 37
Monocytes Absolute: 0.5
Monocytes Relative: 8
Neutro Abs: 3.2

## 2011-01-01 LAB — TYPE AND SCREEN

## 2011-01-01 LAB — BASIC METABOLIC PANEL
Chloride: 108
GFR calc non Af Amer: >60
Glucose, Bld: 132 — ABNORMAL HIGH
Potassium: 4.1
Sodium: 142

## 2011-01-01 LAB — ABO/RH: ABO/RH(D): B POS

## 2011-01-01 NOTE — Assessment & Plan Note (Signed)
Controlled, no change in medication  

## 2011-01-01 NOTE — Assessment & Plan Note (Signed)
Improved. Pt applauded on succesful weight loss through lifestyle change, and encouraged to continue same. Weight loss goal set for the next several months.  

## 2011-01-01 NOTE — Assessment & Plan Note (Signed)
Reports increased difficulty with bladder control and severely malodorous urine, will refer for urology eval

## 2011-01-01 NOTE — Assessment & Plan Note (Signed)
Progressively worsening, requests a cane , which is appropriate, wants to hold on surgery at this time

## 2011-01-01 NOTE — Assessment & Plan Note (Signed)
Recurrent episodes , will check UA, start antibiotic if abnormal, c/o significant malodor and increased incontinence, will refer to urology also

## 2011-01-01 NOTE — Assessment & Plan Note (Signed)
Improved with weight loss

## 2011-01-01 NOTE — Assessment & Plan Note (Signed)
Unchanged , low fat diet discussed and encouraged

## 2011-01-04 LAB — URINE CULTURE: Colony Count: >=100000

## 2011-01-08 ENCOUNTER — Other Ambulatory Visit: Payer: Self-pay | Admitting: Family Medicine

## 2011-01-09 ENCOUNTER — Other Ambulatory Visit (HOSPITAL_COMMUNITY): Payer: Self-pay | Admitting: Urology

## 2011-01-13 ENCOUNTER — Ambulatory Visit (HOSPITAL_COMMUNITY)
Admission: RE | Admit: 2011-01-13 | Discharge: 2011-01-13 | Disposition: A | Discharge status: Home / Self Care | Payer: Medicare HMO | Source: Ambulatory Visit | Attending: Urology | Admitting: Urology

## 2011-01-13 DIAGNOSIS — R32 Unspecified urinary incontinence: Secondary | ICD-10-CM | POA: Insufficient documentation

## 2011-01-13 DIAGNOSIS — Q619 Cystic kidney disease, unspecified: Secondary | ICD-10-CM | POA: Insufficient documentation

## 2011-01-13 DIAGNOSIS — Z8544 Personal history of malignant neoplasm of other female genital organs: Secondary | ICD-10-CM | POA: Insufficient documentation

## 2011-01-13 MED ORDER — IOHEXOL 300 MG/ML  SOLN
100.0000 mL | Freq: Once | INTRAMUSCULAR | Status: AC | PRN
  Administered 2011-01-13: 100 mL via INTRAVENOUS

## 2011-01-28 ENCOUNTER — Ambulatory Visit: Payer: Medicare HMO | Attending: Gynecologic Oncology | Admitting: Gynecologic Oncology

## 2011-01-28 ENCOUNTER — Other Ambulatory Visit (HOSPITAL_COMMUNITY)
Admission: RE | Admit: 2011-01-28 | Discharge: 2011-01-28 | Disposition: A | Discharge status: Home / Self Care | Payer: Medicare HMO | Source: Ambulatory Visit | Attending: Gynecologic Oncology | Admitting: Gynecologic Oncology

## 2011-01-28 ENCOUNTER — Encounter: Payer: Self-pay | Admitting: Gynecologic Oncology

## 2011-01-28 VITALS — BP 142/70 | HR 80 | Temp 98.2°F | Resp 22 | Ht 65.0 in | Wt 181.7 lb

## 2011-01-28 DIAGNOSIS — C549 Malignant neoplasm of corpus uteri, unspecified: Secondary | ICD-10-CM

## 2011-01-28 DIAGNOSIS — Z9079 Acquired absence of other genital organ(s): Secondary | ICD-10-CM | POA: Insufficient documentation

## 2011-01-28 DIAGNOSIS — I10 Essential (primary) hypertension: Secondary | ICD-10-CM | POA: Insufficient documentation

## 2011-01-28 DIAGNOSIS — C541 Malignant neoplasm of endometrium: Secondary | ICD-10-CM

## 2011-01-28 DIAGNOSIS — Z8052 Family history of malignant neoplasm of bladder: Secondary | ICD-10-CM | POA: Insufficient documentation

## 2011-01-28 DIAGNOSIS — Z79899 Other long term (current) drug therapy: Secondary | ICD-10-CM | POA: Insufficient documentation

## 2011-01-28 DIAGNOSIS — R32 Unspecified urinary incontinence: Secondary | ICD-10-CM | POA: Insufficient documentation

## 2011-01-28 DIAGNOSIS — Z124 Encounter for screening for malignant neoplasm of cervix: Secondary | ICD-10-CM | POA: Insufficient documentation

## 2011-01-28 DIAGNOSIS — Z09 Encounter for follow-up examination after completed treatment for conditions other than malignant neoplasm: Secondary | ICD-10-CM | POA: Insufficient documentation

## 2011-01-28 DIAGNOSIS — Z8542 Personal history of malignant neoplasm of other parts of uterus: Secondary | ICD-10-CM | POA: Insufficient documentation

## 2011-01-28 DIAGNOSIS — Z90710 Acquired absence of both cervix and uterus: Secondary | ICD-10-CM | POA: Insufficient documentation

## 2011-01-28 NOTE — Progress Notes (Signed)
Consult Note: Gyn-Onc  [name] [name] 77 y.o. female   CC:  Chief Complaint  Patient presents with  . Follow-up    Uterine Cancer    HPI: [name] is a very pleasant 77 year old woman with laparoscopic hysterectomy BSO and appropriate staging in September of 2008. Final pathology was consistent with a FIGO stage IB, grade 1 endometrioid adenocarcinoma. She had less than 15% myometrial invasion and 15 negative lymph nodes. Based on the current FIGO stage should be a stage IA 1 endometrioid adenocarcinoma. She's been followed closely since that time with no evidence of recurrent disease. I last saw her in November of 2011 which time her examination was negative as was her Pap smear. She saw Dr. Moshe Cipro and complaint of some urinary incontinence and was referred to [facility]. The urinalysis was performed that was negative. In addition, he ordered a CT scan of the abdomen and pelvis which was performed on October 23. A revealed no evidence of recurrent disease. She states that he wishes to do a cystoscopy but she is not sure she wants to undergo that. She only has symptoms consistent with urge incontinence. She denies any gross hematuria she denies any vaginal bleeding or change in bladder habits.  She is up-to-date on her mammograms and is scheduled for another one for December 12. She had a colonoscopy in 2011 and does not require a repeat for 5 years.  Interval History:   ROS: She denies any chest pain shortness of breath nausea vomiting fevers chills headaches visual changes unintentional weight loss weight gain. She does have you urge urinary incontinence and no other complaints. She occasionally has some constipation. Her constipation is relieved with diet. She denies any numbness or neuropathy. 10 point review of systems is otherwise negative.  Current Meds:  Outpatient Encounter Prescriptions as of 01/28/2011  Medication Sig Dispense Refill  . fish oil-omega-3 fatty acids 1000 MG capsule  Take 2 g by mouth daily.        . Multiple Vitamin (MULTIVITAMIN) capsule Take 1 capsule by mouth daily.        Marland Kitchen triamterene-hydrochlorothiazide (MAXZIDE) 75-50 MG per tablet Take 1 tablet by mouth daily.          Allergy:  Allergies  Allergen Reactions  . Ciprofloxacin Hcl     Chills, sick, could not tolerate it  . Penicillins     Social Hx:   History   Social History  . Marital Status: Married    Spouse Name: N/A    Number of Children: N/A  . Years of Education: N/A   Occupational History  . Not on file.   Social History Main Topics  . Smoking status: Never Smoker   . Smokeless tobacco: Not on file  . Alcohol Use: No  . Drug Use: No  . Sexually Active: Yes   Other Topics Concern  . Not on file   Social History Narrative  . No narrative on file    Past Surgical Hx:  Past Surgical History  Procedure Date  . Tonsillectomy   . Cholecystectomy   . Abdominal hysterectomy   . Appendectomy   . Foot surgery 2007    Pins in toes on left foot, 5 to 6 yrs ago    Past Medical Hx:  Past Medical History  Diagnosis Date  . Hypertension   . Cancer     Family Hx:  Family History  Problem Relation Age of Onset  . Heart disease Father   . Cancer  Brother     bladder  . Hypertension Sister   . Diabetes type II Sister     Vitals:  Blood pressure 142/70, pulse 80, temperature 98.2 F (36.8 C), temperature source Oral, resp. rate 22, weight 181 lb 11.2 oz (82.419 kg).  Physical Exam: Well-nourished well-developed female in no acute distress.  Neck is supple there is no lymphadenopathy no thyromegaly.  Lungs are clear to auscultation.  Cardiovascular examination regular rate and rhythm. No murmurs.  Abdomen is soft nontender nondistended no palpable masses or hepatosplenomegaly  Extremities shows no clubbing cyanosis or edema.  Pelvic external genitalia is within normal limits though atrophic. The vagina is markedly atrophic. The vaginal cuff is visualized.  There are no visible lesions. Thin prep Pap smear was completed without difficulty. Bimanual examination reveals no masses or nodularity.     Assessment/Plan: This is a very pleasant 77 year old with stage IA grade 1 endometrioid adenocarcinoma diagnosed and treated for years ago has no evidence of recurrent disease. Followup of the results of her Pap smear and notify her of the results. She will follow up with Dr. Moshe Cipro as scheduled. In addition she'll followup with the urologist as scheduled. I think she may be a good candidate for a trial of Detrol. I look the same time for her so she can discuss with her urologist.  Sherol Dade., MD 01/28/2011, 12:24 PM

## 2011-01-28 NOTE — Patient Instructions (Signed)
Return to clinic in 12 months. Followup with your other physicians as scheduled

## 2011-02-04 ENCOUNTER — Telehealth: Payer: Self-pay | Admitting: *Deleted

## 2011-02-04 NOTE — Telephone Encounter (Signed)
Notified patient that the pap smear that was taken on 01/28/2011 by Dr. Alycia Rossetti was negative, there were no signs of cancer.

## 2011-02-05 LAB — POCT URINALYSIS DIPSTICK
Bilirubin, UA: NEGATIVE
Blood, UA: NEGATIVE
Glucose, UA: NEGATIVE
Spec Grav, UA: 1.025

## 2011-02-05 NOTE — Progress Notes (Signed)
Addended by: Eual Fines on: 02/05/2011 11:17 AM   Modules accepted: Orders

## 2011-02-10 ENCOUNTER — Telehealth: Payer: Self-pay | Admitting: Family Medicine

## 2011-02-10 ENCOUNTER — Other Ambulatory Visit: Payer: Self-pay | Admitting: Family Medicine

## 2011-02-10 MED ORDER — TOLTERODINE TARTRATE ER 4 MG PO CP24: 4.0000 mg | ORAL_CAPSULE | Freq: Every day | ORAL | Status: DC

## 2011-02-10 NOTE — Telephone Encounter (Signed)
Patient said she is still having problems with urinary leakage and it impairs her from going or doing certain things. She was given an rx for detrol before but never got it filled because she thought it would interact with her meds. Got the ok from the oncologist that it would be fine. Wants you to send it in

## 2011-02-10 NOTE — Telephone Encounter (Signed)
Called patient, husband will give her the message

## 2011-02-10 NOTE — Telephone Encounter (Signed)
Pt aware.

## 2011-02-26 ENCOUNTER — Other Ambulatory Visit: Payer: Self-pay | Admitting: Family Medicine

## 2011-02-26 DIAGNOSIS — Z139 Encounter for screening, unspecified: Secondary | ICD-10-CM

## 2011-03-10 ENCOUNTER — Ambulatory Visit (HOSPITAL_COMMUNITY)
Admission: RE | Admit: 2011-03-10 | Discharge: 2011-03-10 | Disposition: A | Discharge status: Home / Self Care | Payer: Medicare HMO | Source: Ambulatory Visit | Attending: Family Medicine | Admitting: Family Medicine

## 2011-03-10 DIAGNOSIS — Z1231 Encounter for screening mammogram for malignant neoplasm of breast: Secondary | ICD-10-CM | POA: Insufficient documentation

## 2011-03-10 DIAGNOSIS — Z139 Encounter for screening, unspecified: Secondary | ICD-10-CM

## 2011-03-23 ENCOUNTER — Other Ambulatory Visit: Payer: Self-pay | Admitting: Family Medicine

## 2011-05-26 ENCOUNTER — Other Ambulatory Visit: Payer: Self-pay | Admitting: Family Medicine

## 2011-05-26 LAB — HEMOGLOBIN A1C
Hgb A1c MFr Bld: 6.2 % — ABNORMAL HIGH (ref <5.7)
Mean Plasma Glucose: 131 mg/dL — ABNORMAL HIGH (ref <117)

## 2011-05-26 LAB — BASIC METABOLIC PANEL
Calcium: 9.7 mg/dL (ref 8.4–10.5)
Glucose, Bld: 104 mg/dL — ABNORMAL HIGH (ref 70–99)
Potassium: 3.3 mEq/L — ABNORMAL LOW (ref 3.5–5.3)
Sodium: 145 mEq/L (ref 135–145)

## 2011-05-28 ENCOUNTER — Ambulatory Visit (INDEPENDENT_AMBULATORY_CARE_PROVIDER_SITE_OTHER): Payer: Medicare HMO | Admitting: Family Medicine

## 2011-05-28 ENCOUNTER — Encounter: Payer: Self-pay | Admitting: Family Medicine

## 2011-05-28 VITALS — BP 120/72 | HR 84 | Resp 16 | Ht 65.0 in | Wt 182.0 lb

## 2011-05-28 DIAGNOSIS — N3090 Cystitis, unspecified without hematuria: Secondary | ICD-10-CM

## 2011-05-28 DIAGNOSIS — R7301 Impaired fasting glucose: Secondary | ICD-10-CM

## 2011-05-28 DIAGNOSIS — I10 Essential (primary) hypertension: Secondary | ICD-10-CM

## 2011-05-28 DIAGNOSIS — E663 Overweight: Secondary | ICD-10-CM

## 2011-05-28 DIAGNOSIS — R7303 Prediabetes: Secondary | ICD-10-CM

## 2011-05-28 DIAGNOSIS — E785 Hyperlipidemia, unspecified: Secondary | ICD-10-CM

## 2011-05-28 DIAGNOSIS — R7309 Other abnormal glucose: Secondary | ICD-10-CM

## 2011-05-28 DIAGNOSIS — M1710 Unilateral primary osteoarthritis, unspecified knee: Secondary | ICD-10-CM

## 2011-05-28 DIAGNOSIS — N3281 Overactive bladder: Secondary | ICD-10-CM

## 2011-05-28 DIAGNOSIS — N318 Other neuromuscular dysfunction of bladder: Secondary | ICD-10-CM

## 2011-05-28 LAB — POCT URINALYSIS DIPSTICK
Bilirubin, UA: NEGATIVE
Glucose, UA: NEGATIVE
Ketones, UA: NEGATIVE
Spec Grav, UA: 1.02
Urobilinogen, UA: 0.2

## 2011-05-28 NOTE — Assessment & Plan Note (Signed)
Unchanged and deteriorated

## 2011-05-28 NOTE — Assessment & Plan Note (Signed)
Deteriorated. Patient re-educated about  the importance of commitment to a  minimum of 150 minutes of exercise per week. The importance of healthy food choices with portion control discussed. Encouraged to start a food diary, count calories and to consider  joining a support group. Sample diet sheets offered. Goals set by the patient for the next several months.    

## 2011-05-28 NOTE — Assessment & Plan Note (Signed)
Controlled, no change in medication  

## 2011-05-28 NOTE — Patient Instructions (Addendum)
CPE may 25 or after.  It is important that you exercise regularly at least 30 minutes 5 times a week. If you develop chest pain, have severe difficulty breathing, or feel very tired, stop exercising immediately and seek medical attention    A healthy diet is rich in fruit, vegetables and whole grains. Poultry fish, nuts and beans are a healthy choice for protein rather then red meat. A low sodium diet and drinking 64 ounces of water daily is generally recommended. Oils and sweet should be limited. Carbohydrates especially for those who are diabetic or overweight, should be limited to 30-45 gram per meal. It is important to eat on a regular schedule, at least 3 times daily. Snacks should be primarily fruits, vegetables or nuts.  Blood sugars are not as good as before, pls cut back on carbs and sweets and commit to daily exercise  Weight loss goal of 3 pounds   bp is excellent.  You will get a list of potassium rich foods.  Chem 7 non fasting in May

## 2011-05-29 DIAGNOSIS — R7303 Prediabetes: Secondary | ICD-10-CM | POA: Insufficient documentation

## 2011-05-29 NOTE — Progress Notes (Signed)
  Subjective:    Patient ID: [name] [name], female    DOB: [birth year], 77 y.o.   MRN: [id number]  HPI The PT is here for follow up and re-evaluation of chronic medical conditions, medication management and review of any available recent lab and radiology data.  Preventive health is updated, specifically  Cancer screening and Immunization.   Questions or concerns regarding consultations or procedures which the PT has had in the interim are  addressed. The PT denies any adverse reactions to current medications since the last visit.  There are no new concerns.  There are no specific complaints       Review of Systems See HPI Denies recent fever or chills. Denies sinus pressure, nasal congestion, ear pain or sore throat. Denies chest congestion, productive cough or wheezing. Denies chest pains, palpitations and leg swelling Denies abdominal pain, nausea, vomiting,diarrhea or constipation.   C/o malodorous urine and incontinence wit frequency. Concerned about possible s/e of detrol Chronic knee pain, and limitation in mobility. Denies headaches, seizures, numbness, or tingling. Denies depression, anxiety or insomnia. Denies skin break down or rash.        Objective:   Physical Exam Patient alert and oriented and in no cardiopulmonary distress.  HEENT: No facial asymmetry, EOMI, no sinus tenderness,  oropharynx pink and moist.  Neck supple no adenopathy.  Chest: Clear to auscultation bilaterally.  CVS: S1, S2 no murmurs, no S3.  ABD: Soft non tender. Bowel sounds normal.  Ext: No edema  MS: Adequate ROM spine, shoulders, hips and reduced in  knees.  Skin: Intact, no ulcerations or rash noted.  Psych: Good eye contact, normal affect. Memory intact not anxious or depressed appearing.  CNS: CN 2-12 intact, power, tone and sensation normal throughout.        Assessment & Plan:

## 2011-05-29 NOTE — Assessment & Plan Note (Signed)
Deteriorated, importance of close attention to low carb diet and regular exercise to prevent development of diabetes stressed

## 2011-05-29 NOTE — Assessment & Plan Note (Signed)
Hyperlipidemia:Low fat diet discussed and encouraged.  Intolerant of meds due to fatty liver

## 2011-05-29 NOTE — Assessment & Plan Note (Signed)
Feels as though detrol interfering with vision , causing blurring , and continues to have malodorous urine. Did not follow through with cystoscopy, but had multiple other evaluations by urology, advised her if has evidence of uti should pursue the cystoscopy

## 2011-05-29 NOTE — Assessment & Plan Note (Signed)
Unchanged , limits ability to walk for exercise to some extent

## 2011-05-31 ENCOUNTER — Other Ambulatory Visit: Payer: Self-pay | Admitting: Family Medicine

## 2011-05-31 MED ORDER — NITROFURANTOIN MONOHYD MACRO 100 MG PO CAPS: 100.0000 mg | ORAL_CAPSULE | Freq: Two times a day (BID) | ORAL | Status: AC

## 2011-06-06 NOTE — Progress Notes (Signed)
After speaking with the pt again, she is not interested in cystoscopy at this time, is opting to wait on this and will call back if and when she decides to proceed.  I again advised that the test is recommended in light of her recurrent uTI's

## 2011-06-08 ENCOUNTER — Telehealth: Payer: Self-pay | Admitting: Family Medicine

## 2011-06-08 NOTE — Telephone Encounter (Signed)
pls call [facility], see if they take Latima's insurance and get back to me pls

## 2011-08-18 ENCOUNTER — Other Ambulatory Visit: Payer: Self-pay | Admitting: Family Medicine

## 2011-08-18 LAB — BASIC METABOLIC PANEL
BUN: 22 mg/dL (ref 6–23)
CO2: 29 mEq/L (ref 19–32)
Chloride: 103 mEq/L (ref 96–112)
Glucose, Bld: 94 mg/dL (ref 70–99)
Potassium: 3.9 mEq/L (ref 3.5–5.3)

## 2011-08-20 ENCOUNTER — Telehealth: Payer: Self-pay | Admitting: Family Medicine

## 2011-08-20 ENCOUNTER — Other Ambulatory Visit (HOSPITAL_COMMUNITY)
Admission: RE | Admit: 2011-08-20 | Discharge: 2011-08-20 | Disposition: A | Discharge status: Home / Self Care | Payer: Medicare HMO | Source: Ambulatory Visit | Attending: Family Medicine | Admitting: Family Medicine

## 2011-08-20 ENCOUNTER — Encounter: Payer: Self-pay | Admitting: Family Medicine

## 2011-08-20 ENCOUNTER — Ambulatory Visit (INDEPENDENT_AMBULATORY_CARE_PROVIDER_SITE_OTHER): Payer: Medicare HMO | Admitting: Family Medicine

## 2011-08-20 VITALS — BP 120/70 | HR 89 | Resp 16 | Ht 65.0 in | Wt 184.0 lb

## 2011-08-20 DIAGNOSIS — Z13820 Encounter for screening for osteoporosis: Secondary | ICD-10-CM

## 2011-08-20 DIAGNOSIS — R7309 Other abnormal glucose: Secondary | ICD-10-CM

## 2011-08-20 DIAGNOSIS — R7303 Prediabetes: Secondary | ICD-10-CM

## 2011-08-20 DIAGNOSIS — E559 Vitamin D deficiency, unspecified: Secondary | ICD-10-CM

## 2011-08-20 DIAGNOSIS — E785 Hyperlipidemia, unspecified: Secondary | ICD-10-CM

## 2011-08-20 DIAGNOSIS — Z124 Encounter for screening for malignant neoplasm of cervix: Secondary | ICD-10-CM | POA: Insufficient documentation

## 2011-08-20 DIAGNOSIS — I10 Essential (primary) hypertension: Secondary | ICD-10-CM

## 2011-08-20 DIAGNOSIS — Z0000 Encounter for general adult medical examination without abnormal findings: Secondary | ICD-10-CM

## 2011-08-20 DIAGNOSIS — Z1211 Encounter for screening for malignant neoplasm of colon: Secondary | ICD-10-CM

## 2011-08-20 DIAGNOSIS — E663 Overweight: Secondary | ICD-10-CM

## 2011-08-20 LAB — POC HEMOCCULT BLD/STL (OFFICE/1-CARD/DIAGNOSTIC): Fecal Occult Blood, POC: NEGATIVE

## 2011-08-20 NOTE — Progress Notes (Signed)
  Subjective:    Patient ID: [name] [name], female    DOB: 11-27-1932, 78 y.o.   MRN: [id number]  HPI The PT is here for annual exam and re-evaluation of chronic medical conditions, medication management and review of any available recent lab and radiology data.  Preventive health is updated, specifically  Cancer screening and Immunization.she has had uterine cancer, and is under surveillance at this time   Questions or concerns regarding consultations or procedures which the PT has had in the interim are  addressed. The PT denies any adverse reactions to current medications since the last visit.  There are no new concerns.  There are no specific complaints       Review of Systems See HPI Denies recent fever or chills. Denies sinus pressure, nasal congestion, ear pain or sore throat. Denies chest congestion, productive cough or wheezing. Denies chest pains, palpitations and leg swelling Denies abdominal pain, nausea, vomiting,diarrhea or constipation.   Denies dysuria, frequency, hesitancy or incontinence. C/o  joint pain in knees , and limitation in mobility. Denies headaches, seizures, numbness, or tingling. Denies depression, anxiety or insomnia. Denies skin break down or rash.        Objective:   Physical Exam  Pleasant mildly obese female, alert and oriented x 3, in no cardio-pulmonary distress. Afebrile. HEENT No facial trauma or asymetry. Sinuses non tender.  EOMI, PERTL, fundoscopic exam  no hemorhage or exudate. Markedly impaired vision External ears normal, tympanic membranes clear. Oropharynx moist, no exudate, good dentition. Neck: supple, no adenopathy,JVD or thyromegaly.No bruits.  Chest: Clear to ascultation bilaterally.No crackles or wheezes. Non tender to palpation  Breast: No asymetry,no masses. No nipple discharge or inversion. No axillary or supraclavicular adenopathy  Cardiovascular system; Heart sounds normal,  S1 and  S2 ,no S3.  No murmur, or  thrill. Apical beat not displaced Peripheral pulses normal.  Abdomen: Soft, non tender, no organomegaly or masses. No bruits. Bowel sounds normal. No guarding, tenderness or rebound.  Rectal:  No mass. Guaiac negative stool.  GU: External genitalia normal. No lesions. Vaginal canal normal.No discharge. Uterus absent, no adnexal masses, no  adnexal tenderness.  Musculoskeletal exam: Full ROM of spine, hips , shoulders and reduced ROM knees, with crepitus No deformity ,swelling or crepitus noted. No muscle wasting or atrophy.   Neurologic: Cranial nerves 2 to 12 intact. Power, tone ,sensation and reflexes normal throughout. No disturbance in gait. No tremor.  Skin: Intact, no ulceration, erythema , scaling or rash noted. Pigmentation normal throughout  Psych; Normal mood and affect. Judgement and concentration normal       Assessment & Plan:

## 2011-08-20 NOTE — Patient Instructions (Addendum)
F/u in 5 month  Fasting lipid, chem 7 , HBA1C in 5 month.  Please sched appt for eye exam  It is important that you exercise regularly at least 30 minutes 5 times a week. If you develop chest pain, have severe difficulty breathing, or feel very tired, stop exercising immediately and seek medical attention    A healthy diet is rich in fruit, vegetables and whole grains. Poultry fish, nuts and beans are a healthy choice for protein rather then red meat. A low sodium diet and drinking 64 ounces of water daily is generally recommended. Oils and sweet should be limited. Carbohydrates especially for those who are diabetic or overweight, should be limited to 34-45 gram per meal. It is important to eat on a regular schedule, at least 3 times daily. Snacks should be primarily fruits, vegetables or nuts.   You need to take aspirin 61m one daily  You need to ensure you are taking 1000 to 1200 mg calcium daily for bone health.  You are referred for bone density scan

## 2011-08-21 LAB — TSH: TSH: 2.45 u[IU]/mL (ref 0.350–4.500)

## 2011-08-22 DIAGNOSIS — E559 Vitamin D deficiency, unspecified: Secondary | ICD-10-CM | POA: Insufficient documentation

## 2011-08-22 MED ORDER — ERGOCALCIFEROL 1.25 MG (50000 UT) PO CAPS: 50000.0000 [IU] | ORAL_CAPSULE | ORAL | Status: DC

## 2011-08-22 NOTE — Assessment & Plan Note (Signed)
Deteriorated. Patient re-educated about  the importance of commitment to a  minimum of 150 minutes of exercise per week. The importance of healthy food choices with portion control discussed. Encouraged to start a food diary, count calories and to consider  joining a support group. Sample diet sheets offered. Goals set by the patient for the next several months.    

## 2011-08-22 NOTE — Assessment & Plan Note (Signed)
Hyperlipidemia:Low fat diet discussed and encouraged.  Statin intolerant due to fatty liver disease

## 2011-08-22 NOTE — Assessment & Plan Note (Signed)
Deterioration in HBA1C, the importance of weight loss and change in eating habits stressed

## 2011-08-22 NOTE — Assessment & Plan Note (Signed)
Controlled, no change in medication  

## 2011-08-22 NOTE — Assessment & Plan Note (Signed)
Home safety discussed, as well as the need for re consideration for knee surgery due to instability, pt not interested at this time. Low carb and low fat diet discussed and encouraged with portion and calorie control for weight loss. Sleep hygiene discussed, she has good hygiene. Commitment to regular exercise, as able addressed and encouraged

## 2011-08-25 ENCOUNTER — Ambulatory Visit (HOSPITAL_COMMUNITY)
Admission: RE | Admit: 2011-08-25 | Discharge: 2011-08-25 | Disposition: A | Discharge status: Home / Self Care | Payer: Medicare HMO | Source: Ambulatory Visit | Attending: Family Medicine | Admitting: Family Medicine

## 2011-08-25 DIAGNOSIS — Z13820 Encounter for screening for osteoporosis: Secondary | ICD-10-CM | POA: Insufficient documentation

## 2011-08-25 DIAGNOSIS — Z780 Asymptomatic menopausal state: Secondary | ICD-10-CM | POA: Insufficient documentation

## 2011-08-25 DIAGNOSIS — M069 Rheumatoid arthritis, unspecified: Secondary | ICD-10-CM | POA: Insufficient documentation

## 2011-08-31 ENCOUNTER — Encounter: Payer: Medicare HMO | Admitting: Family Medicine

## 2011-09-14 ENCOUNTER — Encounter: Payer: Self-pay | Admitting: Family Medicine

## 2011-09-21 ENCOUNTER — Telehealth: Payer: Self-pay | Admitting: Family Medicine

## 2011-09-21 NOTE — Telephone Encounter (Signed)
pls document history, need more detaail , will complete response tomorrow when info available, if this is new prob needs OV

## 2011-09-23 ENCOUNTER — Other Ambulatory Visit: Payer: Self-pay | Admitting: Family Medicine

## 2011-09-23 MED ORDER — PREDNISONE (PAK) 5 MG PO TABS: 5.0000 mg | ORAL_TABLET | ORAL | Status: DC

## 2011-09-23 NOTE — Telephone Encounter (Signed)
Let pt know I will send in a prednisone dose pack if she wishes for short term pain relief . I suggest OV next week for imaging studies to be ordered and further eval. I am entering the prednisone historically pls fax after you spk with her

## 2011-09-23 NOTE — Telephone Encounter (Signed)
Pt aware.

## 2011-09-23 NOTE — Telephone Encounter (Signed)
Pt describes pain like sciatic nerve pain.  Is asking for xrays.

## 2011-10-01 ENCOUNTER — Telehealth: Payer: Self-pay | Admitting: Family Medicine

## 2011-10-02 ENCOUNTER — Other Ambulatory Visit: Payer: Self-pay | Admitting: Family Medicine

## 2011-10-02 DIAGNOSIS — M549 Dorsalgia, unspecified: Secondary | ICD-10-CM

## 2011-10-02 NOTE — Telephone Encounter (Signed)
Pt states she continues to have significant back pain radiating to legs, returns off steroids, pls refer to  imaging for MRI lumbar spine, referral has been entered

## 2011-10-06 ENCOUNTER — Ambulatory Visit
Admission: RE | Admit: 2011-10-06 | Discharge: 2011-10-06 | Disposition: A | Discharge status: Home / Self Care | Payer: Medicare HMO | Source: Ambulatory Visit | Attending: Family Medicine | Admitting: Family Medicine

## 2011-10-06 DIAGNOSIS — M549 Dorsalgia, unspecified: Secondary | ICD-10-CM

## 2011-10-07 ENCOUNTER — Other Ambulatory Visit: Payer: Self-pay | Admitting: Family Medicine

## 2011-10-07 ENCOUNTER — Telehealth: Payer: Self-pay | Admitting: Family Medicine

## 2011-10-07 DIAGNOSIS — M549 Dorsalgia, unspecified: Secondary | ICD-10-CM

## 2011-10-07 MED ORDER — HYDROCODONE-ACETAMINOPHEN 5-500 MG PO TABS: ORAL_TABLET | ORAL | Status: DC

## 2011-10-07 NOTE — Telephone Encounter (Signed)
vicodin prescribed, pls let her know

## 2011-10-08 NOTE — Telephone Encounter (Signed)
Patient aware.

## 2011-10-09 ENCOUNTER — Other Ambulatory Visit: Payer: Medicare HMO

## 2011-10-09 ENCOUNTER — Other Ambulatory Visit: Payer: Self-pay | Admitting: Family Medicine

## 2011-10-14 ENCOUNTER — Telehealth: Payer: Self-pay | Admitting: Family Medicine

## 2011-10-15 ENCOUNTER — Other Ambulatory Visit: Payer: Self-pay

## 2011-10-15 MED ORDER — TRIAMTERENE-HCTZ 75-50 MG PO TABS: 1.0000 | ORAL_TABLET | Freq: Every day | ORAL | Status: DC

## 2011-10-15 NOTE — Telephone Encounter (Signed)
Med originally ordered on 10-03-11 and faxed in.  Reordered today and resent.

## 2011-11-18 ENCOUNTER — Telehealth: Payer: Self-pay | Admitting: Family Medicine

## 2011-11-18 ENCOUNTER — Other Ambulatory Visit: Payer: Self-pay | Admitting: Family Medicine

## 2011-11-18 ENCOUNTER — Other Ambulatory Visit: Payer: Self-pay

## 2011-11-18 MED ORDER — MECLIZINE HCL 12.5 MG PO TABS: 12.5000 mg | ORAL_TABLET | Freq: Three times a day (TID) | ORAL | Status: DC | PRN

## 2011-11-18 NOTE — Telephone Encounter (Signed)
Pt aware and med sent  

## 2011-11-18 NOTE — Telephone Encounter (Signed)
Pt states symptoms started yesterday with her head spinning. States she has had it before but its been awhile. Would like something called in for it  [facility]

## 2011-11-18 NOTE — Telephone Encounter (Signed)
Med entered historically , meclizine, pls let her know then fax in

## 2011-12-22 ENCOUNTER — Ambulatory Visit (HOSPITAL_COMMUNITY)
Admission: RE | Admit: 2011-12-22 | Discharge: 2011-12-22 | Disposition: A | Discharge status: Home / Self Care | Payer: Medicare HMO | Source: Ambulatory Visit | Attending: Neurosurgery | Admitting: Neurosurgery

## 2011-12-22 DIAGNOSIS — R262 Difficulty in walking, not elsewhere classified: Secondary | ICD-10-CM | POA: Insufficient documentation

## 2011-12-22 DIAGNOSIS — M6281 Muscle weakness (generalized): Secondary | ICD-10-CM | POA: Insufficient documentation

## 2011-12-22 DIAGNOSIS — I10 Essential (primary) hypertension: Secondary | ICD-10-CM | POA: Insufficient documentation

## 2011-12-22 DIAGNOSIS — IMO0001 Reserved for inherently not codable concepts without codable children: Secondary | ICD-10-CM | POA: Insufficient documentation

## 2011-12-22 DIAGNOSIS — M79609 Pain in unspecified limb: Secondary | ICD-10-CM | POA: Insufficient documentation

## 2011-12-22 DIAGNOSIS — R29898 Other symptoms and signs involving the musculoskeletal system: Secondary | ICD-10-CM | POA: Insufficient documentation

## 2011-12-22 NOTE — Evaluation (Signed)
Physical Therapy Evaluation  Patient Details  Name: ANGELYSE HESLIN MRN: [id number] Date of Birth: [birth year]  Today's Date: 12/22/2011 Time: 1105-1203 PT Time Calculation (min): 58 min  Visit#: 1  of 12   Re-eval: 01/21/12 Assessment Diagnosis: Lumbar radiculopathy Next MD Visit: 11/1/3 Prior Therapy: none  Authorization: [employer]  Authorization Time Period:    Authorization Visit#: 1  of 10    Past Medical History:  Past Medical History  Diagnosis Date  . Hypertension   . Cancer    Past Surgical History:  Past Surgical History  Procedure Date  . Tonsillectomy   . Cholecystectomy   . Abdominal hysterectomy   . Appendectomy   . Foot surgery 2007    Pins in toes on left foot, 5 to 6 yrs ago    Subjective Symptoms/Limitations Symptoms: Ms. Heckart states that she has had pain running down her left leg since June.  The patient states that her MRI showed bulging discs.  She states that she is doing better now but she feels as if her legs weigh 1000 pounds.  She has been referred to therapy for strrengthening.   How long can you sit comfortably?: The patient states that sitting is alright at this time. How long can you stand comfortably?: Able to stand for 15 minutes How long can you walk comfortably?: has not attempted to walk yet.      Prior Functioning  Prior Function Vocation: Retired Leisure: Hobbies-yes (Comment) Comments: walking    Sensation/Coordination/Flexibility/Functional Tests Functional Tests Functional Tests: LEFS  29/64 take out hopping and running  Assessment RLE Strength Right Hip Flexion: 3-/5 Right Hip Extension: 3/5 Right Hip ABduction: 3/5 Right Hip ADduction: 3-/5 Right Knee Flexion: 3+/5 Right Knee Extension: 5/5 Right Ankle Dorsiflexion: 4/5 LLE Strength Left Hip Flexion: 3-/5 Left Hip Extension: 3+/5 Left Hip ABduction: 3/5 Left Hip ADduction: 3-/5 Left Knee Flexion: 3+/5 Left Knee Extension: 5/5 Left Ankle Dorsiflexion:  5/5 Lumbar AROM Lumbar Flexion: decreased 25% Lumbar Extension: wnl Lumbar - Right Side Bend: wnl Lumbar - Left Side Bend: wnl Lumbar - Right Rotation: decreaed 20% Lumbar - Left Rotation: decreased 205  Exercise/Treatments Mobility/Balance  Posture/Postural Control Posture/Postural Control: Postural limitations Postural Limitations: increased kyphosis decreased lordosis       Supine Ab Set: 10 reps Clam: 5 reps        Physical Therapy Assessment and Plan PT Assessment and Plan Clinical Impression Statement: Pt with significant leg weakness who will benefit from core stability ex to improve strength of core and LE mm to imporve activity tolerance and return pt to previous functional level.  Pt had difficutly completing bent knee raise with proper form. Pt will benefit from skilled therapeutic intervention in order to improve on the following deficits: Decreased activity tolerance;Difficulty walking;Decreased strength;Decreased range of motion;Pain Rehab Potential: Good PT Frequency: Min 2X/week PT Duration: 6 weeks PT Treatment/Interventions: Therapeutic activities;Therapeutic exercise;Patient/family education PT Plan: attempt bent knee raise again, standing extension; heel raise; mini squat; bridge and hip flex isometric ex.  3rd treatment attempt SL abduction, SLR and T-band     Goals Home Exercise Program Pt will Perform Home Exercise Program: Independently PT Short Term Goals Time to Complete Short Term Goals: 2 weeks PT Short Term Goal 1: radicular pain to knee PT Short Term Goal 2: Pain no greater than a 5 PT Short Term Goal 3: mm strength increased 1/2 grade PT Long Term Goals Time to Complete Long Term Goals: 4 weeks PT Long Term Goal 1:  I  advance HEP PT Long Term Goal 2: No radicular pain Long Term Goal 3: Pain at the most a 2/10 80% of the day Long Term Goal 4: walking x 40 min PT Long Term Goal 5: standiing x 40 min Additional PT Long Term Goals?: Yes PT  Long Term Goal 6: mm strength increased one grade PT Long Term Goal 7: LEFS increased 10 points  Problem List Patient Active Problem List  Diagnosis  . MALIGNANT NEOPLASM OF UTERUS PART UNSPECIFIED  . HYPERLIPIDEMIA  . OVERWEIGHT  . HYPERTENSION  . OTHER CHRONIC NONALCOHOLIC LIVER DISEASE  . KNEE, ARTHRITIS, DEGEN./OSTEO  . Pain in joint, lower leg  . BACK PAIN  . Edema  . IMPAIRED FASTING GLUCOSE  . TRANSAMINASES, SERUM, ELEVATED  . FERRITIN, ELEVATED  . LIVER FUNCTION TESTS, ABNORMAL, HX OF  . RESTLESS LEG SYNDROME, HX OF  . UNSPECIFIED VISUAL LOSS  . Pain in joint, lower leg  . Muscle weakness (generalized)  . Cystitis  . OAB (overactive bladder)  . Prediabetes  . Routine general medical examination at a health care facility  . Vitamin d deficiency  . Weakness of both legs  . Difficulty walking    PT - End of Session Activity Tolerance: Patient tolerated treatment well General Behavior During Session: [facility] for tasks performed Cognition: [facility] for tasks performed PT Plan of Care PT Home Exercise Plan: given  GP Functional Assessment Tool Used: LEFS Functional Limitation: Mobility: Walking and moving around Mobility: Walking and Moving Around Current Status ([id number]): At least 40 percent but less than 60 percent impaired, limited or restricted Mobility: Walking and Moving Around Goal Status [phone]): At least 1 percent but less than 20 percent impaired, limited or restricted  RUSSELL,CINDY 12/22/2011, 12:32 PM  Physician Documentation Your signature is required to indicate approval of the treatment plan as stated above.  Please sign and either send electronically or make a copy of this report for your files and return this physician signed original.   Please mark one 1.__approve of plan  2. ___approve of plan with the following conditions.   ______________________________                                                          _____________________ Physician  Signature                                                                                                             Date

## 2011-12-24 ENCOUNTER — Ambulatory Visit (HOSPITAL_COMMUNITY)
Admission: RE | Admit: 2011-12-24 | Discharge: 2011-12-24 | Disposition: A | Discharge status: Home / Self Care | Payer: Medicare HMO | Source: Ambulatory Visit | Attending: Neurosurgery | Admitting: Neurosurgery

## 2011-12-24 NOTE — Progress Notes (Signed)
Physical Therapy Treatment Patient Details  Name: [name] [name] MRN: [id number] Date of Birth: [birth year]  Today's Date: 12/24/2011 Time: 1962-2297 PT Time Calculation (min): 21 min Charges:  therex 20' Visit#: 2  of 12   Re-eval: 01/21/12    Authorization: [employer]  Authorization Visit#: 2  of 10    Subjective: Symptoms/Limitations Symptoms: Pt. states her pain is gone she just needs to get her legs stronger. Pain Assessment Currently in Pain?: No/denies   Exercise/Treatments Standing Heel Raises: 10 reps;Limitations Heel Raises Limitations: toeraises 10 reps Functional Squats: 10 reps Supine Ab Set: 10 reps Clam: 5 reps Bent Knee Raise: 5 reps Bridge: 10 reps Isometric Hip Flexion: 5 reps;5 seconds    Physical Therapy Assessment and Plan PT Assessment and Plan Clinical Impression Statement: Progress exercises per PT POC; able to complete all without pain only cues for stability.  Pt. with good stability performing clams. Pt will benefit from skilled therapeutic intervention in order to improve on the following deficits: Pain PT Plan: Per PT POC, add SL abduction, supine SLR and postural tband exercises next visit.  Progress to walking program.     Problem List Patient Active Problem List  Diagnosis  . MALIGNANT NEOPLASM OF UTERUS PART UNSPECIFIED  . HYPERLIPIDEMIA  . OVERWEIGHT  . HYPERTENSION  . OTHER CHRONIC NONALCOHOLIC LIVER DISEASE  . KNEE, ARTHRITIS, DEGEN./OSTEO  . Pain in joint, lower leg  . BACK PAIN  . Edema  . IMPAIRED FASTING GLUCOSE  . TRANSAMINASES, SERUM, ELEVATED  . FERRITIN, ELEVATED  . LIVER FUNCTION TESTS, ABNORMAL, HX OF  . RESTLESS LEG SYNDROME, HX OF  . UNSPECIFIED VISUAL LOSS  . Pain in joint, lower leg  . Muscle weakness (generalized)  . Cystitis  . OAB (overactive bladder)  . Prediabetes  . Routine general medical examination at a health care facility  . Vitamin d deficiency  . Weakness of both legs  . Difficulty  walking    PT - End of Session Activity Tolerance: Patient tolerated treatment well General Behavior During Session: [facility] for tasks performed Cognition: [facility] for tasks performed   Teena Irani, PTA/CLT 12/24/2011, 3:48 PM

## 2011-12-29 ENCOUNTER — Ambulatory Visit (HOSPITAL_COMMUNITY)
Admission: RE | Admit: 2011-12-29 | Discharge: 2011-12-29 | Disposition: A | Discharge status: Home / Self Care | Payer: Medicare HMO | Source: Ambulatory Visit | Attending: Neurosurgery | Admitting: Neurosurgery

## 2011-12-29 NOTE — Progress Notes (Signed)
Physical Therapy Treatment Patient Details  Name: [name] [name] MRN: [id number] Date of Birth: 03/28/1932  Today's Date: 12/29/2011 Time: 6967-8938 PT Time Calculation (min): 40 min Charges:  therex 38' Visit#: 3  of 12   Re-eval: 01/21/12    Authorization: [employer]  Authorization Visit#: 3  of 10    Subjective: Symptoms/Limitations Symptoms: Pt. states she still gets dizzy sometimes due to her inner ear episode.  Reports no LBP, however pain in R knee from arthritis. Pain Assessment Currently in Pain?: No/denies   Exercise/Treatments Stretches Active Hamstring Stretch: 3 reps;30 seconds;Limitations Active Hamstring Stretch Limitations: with rope, B LE Aerobic Tread Mill: 5' @1 .76mh tactile/VC's throughout Standing Heel Raises: 15 reps Heel Raises Limitations: toeraises 15 reps Functional Squats: 15 reps Scapular Retraction: 10 reps;Theraband Theraband Level (Scapular Retraction): Level 3 (Green) Row: 10 reps;Theraband Theraband Level (Row): Level 3 (Green) Shoulder Extension: 10 reps;Theraband Theraband Level (Shoulder Extension): Level 3 (Green) Supine Ab Set: 10 reps Clam: 10 reps Bent Knee Raise: 10 reps Bridge: 10 reps Isometric Hip Flexion: 10 reps;5 seconds Other Supine Lumbar Exercises: Iso hip add with ball 10X5" Sidelying Hip Abduction: 10 reps Hip Abduction Limitations: bilateral    Physical Therapy Assessment and Plan PT Assessment and Plan Clinical Impression Statement: Began ambulation on treadmill; pt. requires constant cues for posture, heel/toe gait, stride length and not shuffling feet. Added postural strengthening exercises with theraband without difficulty. Able to progress all other reps/activities.  Noted weakness with SLR and sidelying hip abduction.  Pt. required AA with R LE.  Decreased flexiblility in B hamstrings. Pt will benefit from skilled therapeutic intervention in order to improve on the following deficits: Pain PT Plan:  Continue to proress stab and postural exercises; progress walking program/increase time as able.  Add prone hip extension and hamstring curls Bilaterally.     Problem List Patient Active Problem List  Diagnosis  . MALIGNANT NEOPLASM OF UTERUS PART UNSPECIFIED  . HYPERLIPIDEMIA  . OVERWEIGHT  . HYPERTENSION  . OTHER CHRONIC NONALCOHOLIC LIVER DISEASE  . KNEE, ARTHRITIS, DEGEN./OSTEO  . Pain in joint, lower leg  . BACK PAIN  . Edema  . IMPAIRED FASTING GLUCOSE  . TRANSAMINASES, SERUM, ELEVATED  . FERRITIN, ELEVATED  . LIVER FUNCTION TESTS, ABNORMAL, HX OF  . RESTLESS LEG SYNDROME, HX OF  . UNSPECIFIED VISUAL LOSS  . Pain in joint, lower leg  . Muscle weakness (generalized)  . Cystitis  . OAB (overactive bladder)  . Prediabetes  . Routine general medical examination at a health care facility  . Vitamin d deficiency  . Weakness of both legs  . Difficulty walking    PT - End of Session Activity Tolerance: Patient tolerated treatment well General Behavior During Session: [facility]: [facility]   ATeena Irani PTA/CLT 12/29/2011, 3:56 PM

## 2011-12-31 ENCOUNTER — Ambulatory Visit (HOSPITAL_COMMUNITY)
Admission: RE | Admit: 2011-12-31 | Discharge: 2011-12-31 | Disposition: A | Discharge status: Home / Self Care | Payer: Medicare HMO | Source: Ambulatory Visit | Attending: Neurosurgery | Admitting: Neurosurgery

## 2011-12-31 NOTE — Progress Notes (Signed)
Physical Therapy Treatment Patient Details  Name: [name] [name] MRN: [id number] Date of Birth: Aug 18, 1932  Today's Date: 12/31/2011 Time: 3559-7416 PT Time Calculation (min): 44 min  Visit#: 4  of 12   Re-eval: 01/21/12 Charges: Therex x 40'  Authorization: [employer]  Authorization Visit#: 4  of 10    Subjective: Symptoms/Limitations Symptoms: Pt reports no pain, just stiffness. Pain Assessment Currently in Pain?: No/denies   Exercise/Treatments Stretches Active Hamstring Stretch: 3 reps;30 seconds;Limitations Active Hamstring Stretch Limitations: with rope, B LE Standing Heel Raises: 15 reps Heel Raises Limitations: toeraises 15 reps Functional Squats: 15 reps Scapular Retraction: 10 reps;Theraband Theraband Level (Scapular Retraction): Level 3 (Green) Row: 10 reps;Theraband Theraband Level (Row): Level 3 (Green) Shoulder Extension: 10 reps;Theraband Theraband Level (Shoulder Extension): Level 3 (Green) Supine Ab Set: 10 reps;5 seconds Bent Knee Raise: 10 reps Bridge: 10 reps Isometric Hip Flexion: Limitations Isometric Hip Flexion Limitations: Stopped at 8 secondary to fatigue Other Supine Lumbar Exercises: Iso hip add with ball 10X5" Sidelying Clam: 5 reps;Limitations Clam Limitations: 10" holds with TC's for proper mm contraction Hip Abduction: 10 reps Hip Abduction Limitations: bilateral Prone  Straight Leg Raise: 10 reps Other Prone Lumbar Exercises: HS curl x 10 w/3# wt   Physical Therapy Assessment and Plan PT Assessment and Plan Clinical Impression Statement: Pt requiresm ultimodal cueing for form with scapular tband exercises. Pt also require tc's for scapular mm facilitation. Pt diplsy good abdominal contraction with ab sets. Pt requires multimodal cueing for correct mm contraction with SL clams to avoid HS compensation. Pt reports 0/10 pain at end of session. PT Plan: Continue to proress stab and postural exercises; progress walking  program/increase time as able.       Problem List Patient Active Problem List  Diagnosis  . MALIGNANT NEOPLASM OF UTERUS PART UNSPECIFIED  . HYPERLIPIDEMIA  . OVERWEIGHT  . HYPERTENSION  . OTHER CHRONIC NONALCOHOLIC LIVER DISEASE  . KNEE, ARTHRITIS, DEGEN./OSTEO  . Pain in joint, lower leg  . BACK PAIN  . Edema  . IMPAIRED FASTING GLUCOSE  . TRANSAMINASES, SERUM, ELEVATED  . FERRITIN, ELEVATED  . LIVER FUNCTION TESTS, ABNORMAL, HX OF  . RESTLESS LEG SYNDROME, HX OF  . UNSPECIFIED VISUAL LOSS  . Pain in joint, lower leg  . Muscle weakness (generalized)  . Cystitis  . OAB (overactive bladder)  . Prediabetes  . Routine general medical examination at a health care facility  . Vitamin d deficiency  . Weakness of both legs  . Difficulty walking    PT - End of Session Activity Tolerance: Patient tolerated treatment well General Behavior During Session: [facility] for tasks performed Cognition: [facility] for tasks performed  Rachelle Hora, PTA 12/31/2011, 4:40 PM

## 2012-01-05 ENCOUNTER — Ambulatory Visit (HOSPITAL_COMMUNITY)
Admission: RE | Admit: 2012-01-05 | Discharge: 2012-01-05 | Disposition: A | Discharge status: Home / Self Care | Payer: Medicare HMO | Source: Ambulatory Visit | Attending: Neurosurgery | Admitting: Neurosurgery

## 2012-01-05 NOTE — Progress Notes (Signed)
Physical Therapy Treatment Patient Details  Name: KARON COTTERILL MRN: [id number] Date of Birth: 02/14/1933  Today's Date: 01/05/2012 Time: 0034-9611 PT Time Calculation (min): 43 min  Visit#: 5  of 12   Re-eval: 01/21/12 Charges: Therex x 40'  Authorization: [employer]  Authorization Visit#: 5  of 10    Subjective: Symptoms/Limitations Symptoms: Pt reports HEP compliance. Pain Assessment Currently in Pain?: No/denies (Only stiffness)   Exercise/Treatments Stretches Active Hamstring Stretch: 3 reps;30 seconds;Limitations Active Hamstring Stretch Limitations: with rope, B LE Standing Heel Raises: 15 reps Heel Raises Limitations: toeraises 15 reps Functional Squats: 15 reps Scapular Retraction: 10 reps;Theraband Theraband Level (Scapular Retraction): Level 3 (Green) Row: 10 reps;Theraband Theraband Level (Row): Level 3 (Green) Shoulder Extension: 10 reps;Theraband Theraband Level (Shoulder Extension): Level 3 (Green) Supine Bent Knee Raise: 10 reps Bridge: 10 reps Other Supine Lumbar Exercises: Iso hip add with ball 10X5" Sidelying Clam: 5 reps;Limitations Clam Limitations: 10" holds with TC's for proper mm contraction Hip Abduction: 10 reps Hip Abduction Limitations: bilateral Prone  Straight Leg Raise: 10 reps Other Prone Lumbar Exercises: HS curl x 10 w/3# wt  Physical Therapy Assessment and Plan PT Assessment and Plan Clinical Impression Statement: Pt continues to require moderate multimodal cueing for proper technique. Pt requires tc's to avoid hip ER and abduction with supine bent knee raise secondary to B hip weakness. Pt also requires multimodal cueing to deactivate hamstring and facilitated hip abductors. Pt reports 0/10 pain at end of session. PT Plan: Continue to proress stab and postural exercises; progress walking program/increase time as able.      Problem List Patient Active Problem List  Diagnosis  . MALIGNANT NEOPLASM OF UTERUS PART  UNSPECIFIED  . HYPERLIPIDEMIA  . OVERWEIGHT  . HYPERTENSION  . OTHER CHRONIC NONALCOHOLIC LIVER DISEASE  . KNEE, ARTHRITIS, DEGEN./OSTEO  . Pain in joint, lower leg  . BACK PAIN  . Edema  . IMPAIRED FASTING GLUCOSE  . TRANSAMINASES, SERUM, ELEVATED  . FERRITIN, ELEVATED  . LIVER FUNCTION TESTS, ABNORMAL, HX OF  . RESTLESS LEG SYNDROME, HX OF  . UNSPECIFIED VISUAL LOSS  . Pain in joint, lower leg  . Muscle weakness (generalized)  . Cystitis  . OAB (overactive bladder)  . Prediabetes  . Routine general medical examination at a health care facility  . Vitamin d deficiency  . Weakness of both legs  . Difficulty walking    PT - End of Session Activity Tolerance: Patient tolerated treatment well General Behavior During Session: [facility] for tasks performed Cognition: [facility] for tasks performed   Rachelle Hora, PTA 01/05/2012, 3:51 PM

## 2012-01-07 ENCOUNTER — Ambulatory Visit (HOSPITAL_COMMUNITY)
Admission: RE | Admit: 2012-01-07 | Discharge: 2012-01-07 | Disposition: A | Discharge status: Home / Self Care | Payer: Medicare HMO | Source: Ambulatory Visit | Attending: Neurosurgery | Admitting: Neurosurgery

## 2012-01-07 DIAGNOSIS — R262 Difficulty in walking, not elsewhere classified: Secondary | ICD-10-CM

## 2012-01-07 DIAGNOSIS — R29898 Other symptoms and signs involving the musculoskeletal system: Secondary | ICD-10-CM

## 2012-01-07 NOTE — Progress Notes (Signed)
Physical Therapy Treatment Patient Details  Name: VENNESSA AFFINITO MRN: [id number] Date of Birth: 02/06/1933  Today's Date: 01/07/2012 Time: 1520-1600 PT Time Calculation (min): 40 min  Visit#: 6  of 12   Re-eval: 01/21/12    Authorization: [employer]-check with Leafy Ro as far as approval  Authorization Visit#: 6  of 10    Subjective: Symptoms/Limitations Symptoms: Ms. Parks states that she is feeling better she has some knee pain but she knows this is OA Pain Assessment Currently in Pain?: No/denies   Exercise/Treatments     Stretches Active Hamstring Stretch: 3 reps;30 seconds Single Knee to Chest Stretch: 3 reps;30 seconds   Standing Scapular Retraction: Strengthening;15 reps;Theraband Theraband Level (Scapular Retraction): Level 3 (Green) Row: Strengthening;Both;15 reps;Theraband Theraband Level (Row): Level 3 (Green) Shoulder Extension: Strengthening;Both;15 reps;Theraband Theraband Level (Shoulder Extension): Level 3 (Green) Supine Bent Knee Raise: 10 reps Bridge: 10 reps;Limitations Bridge Limitations: with ball squeeze Other Supine Lumbar Exercises: adductor squeeze x 10 Sidelying Hip Abduction: 10 reps Prone  Straight Leg Raise: 10 reps Opposite Arm/Leg Raise: 10 reps Other Prone Lumbar Exercises: heel squeeze x 10 Other Prone Lumbar Exercises: axial ext; B shoulder ext x 10    Physical Therapy Assessment and Plan PT Assessment and Plan Clinical Impression Statement: Pt needs multiple verval cueing for all exercises.  Pt has decreased awareness of body proprioception needing to be shown both supine and prone when her body is properly aligned .  Pt unable to keep supine bent knee positon due to weak hip adductor/rotators.   PT Plan: begin sitting heel in/out next treatment for improved hip stability.    Goals    Problem List Patient Active Problem List  Diagnosis  . MALIGNANT NEOPLASM OF UTERUS PART UNSPECIFIED  . HYPERLIPIDEMIA  . OVERWEIGHT  .  HYPERTENSION  . OTHER CHRONIC NONALCOHOLIC LIVER DISEASE  . KNEE, ARTHRITIS, DEGEN./OSTEO  . Pain in joint, lower leg  . BACK PAIN  . Edema  . IMPAIRED FASTING GLUCOSE  . TRANSAMINASES, SERUM, ELEVATED  . FERRITIN, ELEVATED  . LIVER FUNCTION TESTS, ABNORMAL, HX OF  . RESTLESS LEG SYNDROME, HX OF  . UNSPECIFIED VISUAL LOSS  . Pain in joint, lower leg  . Muscle weakness (generalized)  . Cystitis  . OAB (overactive bladder)  . Prediabetes  . Routine general medical examination at a health care facility  . Vitamin d deficiency  . Weakness of both legs  . Difficulty walking    PT - End of Session Activity Tolerance: Patient tolerated treatment well General Behavior During Session: [facility] for tasks performed Cognition: [facility] for tasks performed  GP    RUSSELL,CINDY 01/07/2012, 4:18 PM

## 2012-01-12 ENCOUNTER — Ambulatory Visit (HOSPITAL_COMMUNITY)
Admission: RE | Admit: 2012-01-12 | Discharge: 2012-01-12 | Disposition: A | Discharge status: Home / Self Care | Payer: Medicare HMO | Source: Ambulatory Visit | Attending: Family Medicine | Admitting: Family Medicine

## 2012-01-12 DIAGNOSIS — R29898 Other symptoms and signs involving the musculoskeletal system: Secondary | ICD-10-CM

## 2012-01-12 DIAGNOSIS — R262 Difficulty in walking, not elsewhere classified: Secondary | ICD-10-CM

## 2012-01-12 NOTE — Progress Notes (Signed)
Physical Therapy Treatment Patient Details  Name: [name] [name] MRN: [id number] Date of Birth: 04/20/1932  Today's Date: 01/12/2012 Time: 1520-1600 PT Time Calculation (min): 40 min  Visit#: 7  of 12   Re-eval: 01/21/12  Charge: therex 40'  Authorization Visit#: 7  of 10    Subjective: Symptoms/Limitations Symptoms: Feel like I am getting better, no pain today. Pain Assessment Currently in Pain?: No/denies  Objective:   Exercise/Treatments Stretches Active Hamstring Stretch: 3 reps;30 seconds Standing Functional Squats: 15 reps;Limitations Functional Squats Limitations: proper lifting Scapular Retraction: Strengthening;Both;20 reps;Theraband Theraband Level (Scapular Retraction): Level 3 (Green) Row: Strengthening;Both;15 reps;Theraband Theraband Level (Row): Level 3 (Green) Shoulder Extension: Strengthening;Both;15 reps;Theraband Theraband Level (Shoulder Extension): Level 3 (Green) Seated Other Seated Lumbar Exercises: Heel /Toe roll in/out 10x 10" Supine Bent Knee Raise: 10 reps Bridge: 10 reps;Limitations Bridge Limitations: with ball adduction squeeze Prone  Straight Leg Raise: 10 reps Other Prone Lumbar Exercises: heel squeeze x 10 Other Prone Lumbar Exercises: h/s curl 10x with NMR to reduce glut activation and increase hs contraction  Physical Therapy Assessment and Plan PT Assessment and Plan Clinical Impression Statement: Pt continues to require multimodal cueing with all exercises with noted decreased awareness of body proprioception.  Tactile and verbal cues required with mat activities to keep body in proper alignment.  PT Plan: Continue with current POC for LE strengthening.    Goals    Problem List Patient Active Problem List  Diagnosis  . MALIGNANT NEOPLASM OF UTERUS PART UNSPECIFIED  . HYPERLIPIDEMIA  . OVERWEIGHT  . HYPERTENSION  . OTHER CHRONIC NONALCOHOLIC LIVER DISEASE  . KNEE, ARTHRITIS, DEGEN./OSTEO  . Pain in joint, lower leg  .  BACK PAIN  . Edema  . IMPAIRED FASTING GLUCOSE  . TRANSAMINASES, SERUM, ELEVATED  . FERRITIN, ELEVATED  . LIVER FUNCTION TESTS, ABNORMAL, HX OF  . RESTLESS LEG SYNDROME, HX OF  . UNSPECIFIED VISUAL LOSS  . Pain in joint, lower leg  . Muscle weakness (generalized)  . Cystitis  . OAB (overactive bladder)  . Prediabetes  . Routine general medical examination at a health care facility  . Vitamin d deficiency  . Weakness of both legs  . Difficulty walking    PT - End of Session Activity Tolerance: Patient tolerated treatment well General Behavior During Session: [facility] for tasks performed Cognition: [facility] for tasks performed  GP    Aldona Lento 01/12/2012, 6:28 PM

## 2012-01-14 ENCOUNTER — Ambulatory Visit (HOSPITAL_COMMUNITY)
Admission: RE | Admit: 2012-01-14 | Discharge: 2012-01-14 | Disposition: A | Discharge status: Home / Self Care | Payer: Medicare HMO | Source: Ambulatory Visit | Attending: Neurosurgery | Admitting: Neurosurgery

## 2012-01-14 ENCOUNTER — Other Ambulatory Visit: Payer: Self-pay | Admitting: Family Medicine

## 2012-01-14 DIAGNOSIS — R262 Difficulty in walking, not elsewhere classified: Secondary | ICD-10-CM

## 2012-01-14 DIAGNOSIS — R29898 Other symptoms and signs involving the musculoskeletal system: Secondary | ICD-10-CM

## 2012-01-14 NOTE — Progress Notes (Signed)
Physical Therapy Treatment Patient Details  Name: [name] [name] MRN: [id number] Date of Birth: 08-31-1932  Today's Date: 01/14/2012 Time: 5400-8676 PT Time Calculation (min): 43 min  Visit#: 8  of 12   Re-eval: 01/21/12  Charge: therex 40'  Authorization: [place] covered thru 02/05/2012  Authorization Time Period:    Authorization Visit#: 8  of 10    Subjective: Symptoms/Limitations Symptoms: Pt reported no LBP or radiculat pain, R bicep pain 4/10 today. Pain Assessment Currently in Pain?: Yes Pain Score:   4 Pain Location: Arm (biceps) Pain Orientation: Right  Objective:   Exercise/Treatments Aerobic Tread Mill: 26" @ 1.1 c/o dizziness Standing Functional Squats: 15 reps;Limitations Functional Squats Limitations: proper lifting Scapular Retraction: Strengthening;Both;15 reps;Limitations Theraband Level (Scapular Retraction): Level 3 (Green) Scapular Retraction Limitations: max multimodal cueing for proper technique/form today Row: Strengthening;Both;15 reps;Theraband Theraband Level (Row): Level 3 (Green) Shoulder Extension: Strengthening;Both;20 reps;Theraband Theraband Level (Shoulder Extension): Level 3 (Green) Seated Other Seated Lumbar Exercises: Heel roll out 10x 10" Other Seated Lumbar Exercises: Adduction isometric 10x10" Supine Bent Knee Raise: 10 reps;5 seconds Bridge: 10 reps;Limitations Bridge Limitations: with ball adduction squeeze Sidelying Hip Abduction: 10 reps Other Sidelying Lumbar Exercises: Hip adduction 10 reps bilateral Prone  Other Prone Lumbar Exercises: heel squeeze x 10 Other Prone Lumbar Exercises: h/s curl 10x with NMR to reduce glut activation and increase hs contraction  Physical Therapy Assessment and Plan PT Assessment and Plan Clinical Impression Statement: Pt continues to require multimodal cueing for decreased body awareness with positionings, required constant cueing to reduce hip ER with supine mat activities.  Added  S/L adduction to POC with great difficulty due to weakness. PT Plan: Continue with current POC for LE strengthening.    Goals    Problem List Patient Active Problem List  Diagnosis  . MALIGNANT NEOPLASM OF UTERUS PART UNSPECIFIED  . HYPERLIPIDEMIA  . OVERWEIGHT  . HYPERTENSION  . OTHER CHRONIC NONALCOHOLIC LIVER DISEASE  . KNEE, ARTHRITIS, DEGEN./OSTEO  . Pain in joint, lower leg  . BACK PAIN  . Edema  . IMPAIRED FASTING GLUCOSE  . TRANSAMINASES, SERUM, ELEVATED  . FERRITIN, ELEVATED  . LIVER FUNCTION TESTS, ABNORMAL, HX OF  . RESTLESS LEG SYNDROME, HX OF  . UNSPECIFIED VISUAL LOSS  . Pain in joint, lower leg  . Muscle weakness (generalized)  . Cystitis  . OAB (overactive bladder)  . Prediabetes  . Routine general medical examination at a health care facility  . Vitamin d deficiency  . Weakness of both legs  . Difficulty walking    PT - End of Session Activity Tolerance: Patient tolerated treatment well General Behavior During Session: [facility] for tasks performed Cognition: [facility] for tasks performed  GP    Aldona Lento 01/14/2012, 6:24 PM

## 2012-01-15 LAB — BASIC METABOLIC PANEL
BUN: 18 mg/dL (ref 6–23)
Potassium: 3.6 mEq/L (ref 3.5–5.3)
Sodium: 142 mEq/L (ref 135–145)

## 2012-01-15 LAB — LIPID PANEL
Cholesterol: 261 mg/dL — ABNORMAL HIGH (ref 0–200)
Triglycerides: 82 mg/dL (ref <150)
VLDL: 16 mg/dL (ref 0–40)

## 2012-01-19 ENCOUNTER — Ambulatory Visit (INDEPENDENT_AMBULATORY_CARE_PROVIDER_SITE_OTHER): Payer: Medicare HMO | Admitting: Family Medicine

## 2012-01-19 ENCOUNTER — Encounter: Payer: Self-pay | Admitting: Family Medicine

## 2012-01-19 ENCOUNTER — Ambulatory Visit (HOSPITAL_COMMUNITY)
Admission: RE | Admit: 2012-01-19 | Discharge: 2012-01-19 | Disposition: A | Discharge status: Home / Self Care | Payer: Medicare HMO | Source: Ambulatory Visit | Attending: Neurosurgery | Admitting: Neurosurgery

## 2012-01-19 VITALS — BP 126/72 | HR 99 | Resp 18 | Ht 65.0 in | Wt 180.0 lb

## 2012-01-19 DIAGNOSIS — E559 Vitamin D deficiency, unspecified: Secondary | ICD-10-CM

## 2012-01-19 DIAGNOSIS — M25569 Pain in unspecified knee: Secondary | ICD-10-CM

## 2012-01-19 DIAGNOSIS — E663 Overweight: Secondary | ICD-10-CM

## 2012-01-19 DIAGNOSIS — E785 Hyperlipidemia, unspecified: Secondary | ICD-10-CM

## 2012-01-19 DIAGNOSIS — M79609 Pain in unspecified limb: Secondary | ICD-10-CM

## 2012-01-19 DIAGNOSIS — I10 Essential (primary) hypertension: Secondary | ICD-10-CM

## 2012-01-19 DIAGNOSIS — R29898 Other symptoms and signs involving the musculoskeletal system: Secondary | ICD-10-CM

## 2012-01-19 DIAGNOSIS — C549 Malignant neoplasm of corpus uteri, unspecified: Secondary | ICD-10-CM

## 2012-01-19 DIAGNOSIS — R262 Difficulty in walking, not elsewhere classified: Secondary | ICD-10-CM

## 2012-01-19 DIAGNOSIS — R7303 Prediabetes: Secondary | ICD-10-CM

## 2012-01-19 DIAGNOSIS — M1710 Unilateral primary osteoarthritis, unspecified knee: Secondary | ICD-10-CM

## 2012-01-19 DIAGNOSIS — R7309 Other abnormal glucose: Secondary | ICD-10-CM

## 2012-01-19 DIAGNOSIS — M549 Dorsalgia, unspecified: Secondary | ICD-10-CM

## 2012-01-19 DIAGNOSIS — C541 Malignant neoplasm of endometrium: Secondary | ICD-10-CM

## 2012-01-19 DIAGNOSIS — M79603 Pain in arm, unspecified: Secondary | ICD-10-CM

## 2012-01-19 DIAGNOSIS — R7301 Impaired fasting glucose: Secondary | ICD-10-CM

## 2012-01-19 NOTE — Progress Notes (Signed)
  Subjective:    Patient ID: [name] [name], female    DOB: Aug 13, 1932, 78 y.o.   MRN: [id number]  HPI The PT is here for follow up and re-evaluation of chronic medical conditions, medication management and review of any available recent lab and radiology data.  Preventive health is updated, specifically  Cancer screening and Immunization.   Questions or concerns regarding consultations or procedures which the PT has had in the interim are  Addressed.Has seen neurosurgery and the recommendation is to have physical therapy and lose weight . She c/o increased knee pain and swelling intermittently in the past 2 to 3 weeks The PT denies any adverse reactions to current medications since the last visit.  There are no new concerns.  Has final upcoming appt with gyne for endometrial cancer history, there has been no evidence of recurrence        Review of Systems See HPI Denies recent fever or chills. Denies sinus pressure, nasal congestion, ear pain or sore throat. Denies chest congestion, productive cough or wheezing. Denies chest pains, palpitations and leg swelling Denies abdominal pain, nausea, vomiting,diarrhea or constipation.   Denies dysuria, frequency, hesitancy or incontinence.  Denies headaches, seizures, numbness, or tingling. Denies depression, anxiety or insomnia. Denies skin break down or rash.        Objective:   Physical Exam Patient alert and oriented and in no cardiopulmonary distress.  HEENT: No facial asymmetry, EOMI, no sinus tenderness,  oropharynx pink and moist.  Neck decreased ROM  no adenopathy.  Chest: Clear to auscultation bilaterally.  CVS: S1, S2 no murmurs, no S3.  ABD: Soft non tender. Bowel sounds normal.  Ext: No edema  MS: decreased  ROM spine, shoulders, hips and knees.  Skin: Intact, no ulcerations or rash noted.  Psych: Good eye contact, normal affect. Memory intact not anxious or depressed appearing.  CNS: CN 2-12 intact, power, tone  and sensation normal throughout.        Assessment & Plan:

## 2012-01-19 NOTE — Progress Notes (Signed)
Physical Therapy Treatment Patient Details  Name: [name] [name] MRN: [id number] Date of Birth: September 06, 1932  Today's Date: 01/19/2012 Time: 0712-1975 PT Time Calculation (min): 48 min  Visit#: 9  of 12   Re-eval: 01/21/12    Authorization: [employer] medicare g code needed next visit    Authorization Visit#: 9  of 10    Subjective: Symptoms/Limitations Symptoms: Pt states that her back is doing better but her legs feel extremely weak.  Exercise/Treatments     Stretches Active Hamstring Stretch: 3 reps;30 seconds   Machines for Strengthening Cybex Knee Extension: 2 PL x 10 Cybex Knee Flexion: 2 PL x 10 Standing Heel Raises: 15 reps Functional Squats: 15 reps Scapular Retraction: Strengthening;10 reps;Theraband Theraband Level (Scapular Retraction): Level 3 (Green) Row: Strengthening;10 reps;Theraband Theraband Level (Row): Level 3 (Green) Shoulder Extension: Strengthening;10 reps;Theraband Theraband Level (Shoulder Extension): Level 3 (Green) Seated Other Seated Lumbar Exercises: heel squeeze/ toe squeeze x 10 Supine Bent Knee Raise: 10 reps Bridge: 15 reps Straight Leg Raise: 5 reps Sidelying Clam: 10 reps Hip Abduction: 15 reps Prone  Single Arm Raise: 5 reps Straight Leg Raise: 10 reps Opposite Arm/Leg Raise: 10 reps     Physical Therapy Assessment and Plan PT Assessment and Plan Clinical Impression Statement: Pt needs constant verbal cuing to correctly complete exercises.  Added SLR with significant difficulty; cybex ex added for strengthening of LE PT Plan: Have pt fill out LEFS prior to leaving next visit for new G code;  continute with strengthening of LE.    Goals    Problem List Patient Active Problem List  Diagnosis  . MALIGNANT NEOPLASM OF UTERUS PART UNSPECIFIED  . HYPERLIPIDEMIA  . OVERWEIGHT  . HYPERTENSION  . OTHER CHRONIC NONALCOHOLIC LIVER DISEASE  . KNEE, ARTHRITIS, DEGEN./OSTEO  . Pain in joint, lower leg  . BACK PAIN  . Edema  .  IMPAIRED FASTING GLUCOSE  . TRANSAMINASES, SERUM, ELEVATED  . FERRITIN, ELEVATED  . LIVER FUNCTION TESTS, ABNORMAL, HX OF  . RESTLESS LEG SYNDROME, HX OF  . UNSPECIFIED VISUAL LOSS  . Pain in joint, lower leg  . Muscle weakness (generalized)  . Cystitis  . OAB (overactive bladder)  . Prediabetes  . Routine general medical examination at a health care facility  . Vitamin d deficiency  . Weakness of both legs  . Difficulty walking     GP Functional Assessment Tool Used: LEFS  RUSSELL,CINDY 01/19/2012, 4:18 PM

## 2012-01-19 NOTE — Patient Instructions (Addendum)
Annual wellness in 5.5 months, please call if you need me before  Cholesterol has increased, please cut back on fat in diet, red meat, cheese, whole miilk, butter and the yellow of eggs.  Blood sugar has improved, congrats, keep this up  Congrats on weight loss, goal of 6 to 10 pounds in the next 5 month  I hope that therapy for the back will help , I believe it will and this is your best option also weight loss will help  Keep appt with gyne   Please commit to daily physical activity as tolerated  Commit to 1280m calcium daily and also otc vit D 3 800IU daily Fasting chem 7, hBa1C , vit D in 5.5 month

## 2012-01-21 ENCOUNTER — Ambulatory Visit (HOSPITAL_COMMUNITY)
Admission: RE | Admit: 2012-01-21 | Discharge: 2012-01-21 | Disposition: A | Discharge status: Home / Self Care | Payer: Medicare HMO | Source: Ambulatory Visit | Attending: Family Medicine | Admitting: Family Medicine

## 2012-01-21 NOTE — Progress Notes (Signed)
Physical Therapy Treatment Patient Details  Name: KAYLISE BLAKELEY MRN: [id number] Date of Birth: 05-Dec-1932  Today's Date: 01/21/2012 Time: 1510-1602 PT Time Calculation (min): 52 min  Visit#: 10  of 12   Re-eval: 01/28/12 Assessment Diagnosis: Lumbar radiculopathy Next MD Visit: 11/1/3 Prior Therapy: none  Authorization: [employer];  All visits covered from 12/22/11-02/05/12  Authorization Time Period:    Authorization Visit#: 10  of 10    Subjective: Symptoms/Limitations Symptoms: Pt continues to do her exercises at home.  Precautions/Restrictions     Exercise/Treatments Mobility/Balance     Posture/Postural Control Posture/Postural Control: Postural limitations Postural Limitations: increased kyphosis decreased lordosis    Machines for Strengthening Cybex Knee Extension: 2 pL X 15 Cybex Knee Flexion: 3 pl X 15 Standing Heel Raises: 15 reps Functional Squats: 15 reps Forward Lunge: 10 reps Scapular Retraction: Strengthening;10 reps;Theraband Theraband Level (Scapular Retraction): Level 3 (Green) Row: Strengthening;10 reps;Theraband Theraband Level (Row): Level 3 (Green) Shoulder Extension: Strengthening;Both;10 reps;Theraband Theraband Level (Shoulder Extension): Level 3 (Green) Seated  ins/outs x 10 Supine Bridge: 15 reps;Limitations Bridge Limitations: W/adduction squeeze Sidelying Hip Abduction: 15 reps Prone  Straight Leg Raise: 15 reps    Physical Therapy Assessment and Plan PT Assessment and Plan Clinical Impression Statement: Pt has improved in strength for every mm group in her LE B;  added lunges;  Pt still has significant coordination difficuly needing multiple cues to complete the exercises correctly.  LEFS has not improved but feel that this is due to extreme weakness of the mm originally; mm have improved but the majority are not yet a 4/5  PT Plan: recommend that pt continues therapy through 11/15 to improve form with exercises.  begin  lateral and forward step ups; side step w/ t-band next treatment.  Pt therapy ends next week 01/28/11 check ROM and see if pt would like to continue another week as her insurance has already approved.  Goals  Progressing towards strengthening but as of yet has not converted into functional.    Problem List Patient Active Problem List  Diagnosis  . MALIGNANT NEOPLASM OF UTERUS PART UNSPECIFIED  . HYPERLIPIDEMIA  . OVERWEIGHT  . HYPERTENSION  . OTHER CHRONIC NONALCOHOLIC LIVER DISEASE  . KNEE, ARTHRITIS, DEGEN./OSTEO  . Pain in joint, lower leg  . BACK PAIN  . Edema  . IMPAIRED FASTING GLUCOSE  . TRANSAMINASES, SERUM, ELEVATED  . FERRITIN, ELEVATED  . LIVER FUNCTION TESTS, ABNORMAL, HX OF  . RESTLESS LEG SYNDROME, HX OF  . UNSPECIFIED VISUAL LOSS  . Pain in joint, lower leg  . Muscle weakness (generalized)  . Cystitis  . OAB (overactive bladder)  . Prediabetes  . Routine general medical examination at a health care facility  . Vitamin d deficiency  . Weakness of both legs  . Difficulty walking       GP Functional Assessment Tool Used: LEFS Functional Limitation: Mobility: Walking and moving around Mobility: Walking and Moving Around Current Status [phone]): At least 40 percent but less than 60 percent impaired, limited or restricted Mobility: Walking and Moving Around Goal Status [phone]): At least 1 percent but less than 20 percent impaired, limited or restricted  RUSSELL,CINDY 01/21/2012, 4:14 PM

## 2012-01-26 ENCOUNTER — Ambulatory Visit (HOSPITAL_COMMUNITY)
Admission: RE | Admit: 2012-01-26 | Discharge: 2012-01-26 | Disposition: A | Discharge status: Home / Self Care | Payer: Medicare HMO | Source: Ambulatory Visit | Attending: Neurosurgery | Admitting: Neurosurgery

## 2012-01-26 DIAGNOSIS — R262 Difficulty in walking, not elsewhere classified: Secondary | ICD-10-CM | POA: Insufficient documentation

## 2012-01-26 DIAGNOSIS — R29898 Other symptoms and signs involving the musculoskeletal system: Secondary | ICD-10-CM

## 2012-01-26 DIAGNOSIS — I10 Essential (primary) hypertension: Secondary | ICD-10-CM | POA: Insufficient documentation

## 2012-01-26 DIAGNOSIS — IMO0001 Reserved for inherently not codable concepts without codable children: Secondary | ICD-10-CM | POA: Insufficient documentation

## 2012-01-26 DIAGNOSIS — M6281 Muscle weakness (generalized): Secondary | ICD-10-CM | POA: Insufficient documentation

## 2012-01-26 DIAGNOSIS — M79609 Pain in unspecified limb: Secondary | ICD-10-CM | POA: Insufficient documentation

## 2012-01-26 NOTE — Progress Notes (Signed)
Physical Therapy Treatment Patient Details  Name: [name] [name] MRN: [id number] Date of Birth: Apr 25, 1932  Today's Date: 01/26/2012 Time: 9379-0240 PT Time Calculation (min): 42 min  Visit#: 11  of 12   Re-eval: 01/28/12 Assessment Diagnosis: Lumbar radiculopathy  Authorization: [employer]; All visits covered from 12/22/11-02/05/12  Authorization Time Period: gcode on 10 th visit  Authorization Visit#: 11  of 12   Charge: therex 42'  Subjective: Symptoms/Limitations Symptoms: Pt stated pain free at entrance today, only c/o pain in R bicept region. Pain Assessment Currently in Pain?: No/denies (does c/o R bicep region pain) Pain Score: 0-No pain  Objective:   Exercise/Treatments Machines for Strengthening Cybex Knee Extension: 2.5 pL X 15 Cybex Knee Flexion: 3 pl X 15 Standing Heel Raises: 20 reps;Limitations Heel Raises Limitations: toeraises 20 reps Functional Squats: 20 reps Forward Lunge: 15 reps Other Standing Lumbar Exercises: Forward and lateral step ups BLE 15x  Other Standing Lumbar Exercises: side stepping with blue tband with vc-ing Seated Other Seated Lumbar Exercises: heel roll out with orange ball between knees 10x 10" Supine Bridge: 15 reps;Limitations Bridge Limitations: w/ adduction squeeze Isometric Hip Flexion: 10 reps;5 seconds Isometric Hip Flexion Limitations: BLE Other Supine Lumbar Exercises: adductor squeeze 10 x 10    Physical Therapy Assessment and Plan PT Assessment and Plan Clinical Impression Statement: Session focus on overall LE strengthening, added stair training and sidestepping for hip strengtheing. Pt required multimodal cueing to foot placement to correct musculature activation. Pt continues to show overall hip musculature weakness.  Resumed isometric hip flexion back to POC for hip strengthening. PT Plan: Reassess next session.  Take knee ROM and review goals, have discussion with pt to continue vs d/c to HEP.  Initial  evaluating PT recommends continuing OPPT through 02/05/2012 as insurance already approves through.    Goals    Problem List Patient Active Problem List  Diagnosis  . MALIGNANT NEOPLASM OF UTERUS PART UNSPECIFIED  . HYPERLIPIDEMIA  . OVERWEIGHT  . HYPERTENSION  . OTHER CHRONIC NONALCOHOLIC LIVER DISEASE  . KNEE, ARTHRITIS, DEGEN./OSTEO  . Pain in joint, lower leg  . BACK PAIN  . Edema  . IMPAIRED FASTING GLUCOSE  . TRANSAMINASES, SERUM, ELEVATED  . FERRITIN, ELEVATED  . LIVER FUNCTION TESTS, ABNORMAL, HX OF  . RESTLESS LEG SYNDROME, HX OF  . UNSPECIFIED VISUAL LOSS  . Pain in joint, lower leg  . Muscle weakness (generalized)  . Cystitis  . OAB (overactive bladder)  . Prediabetes  . Routine general medical examination at a health care facility  . Vitamin d deficiency  . Weakness of both legs  . Difficulty walking    PT - End of Session Activity Tolerance: Patient tolerated treatment well General Behavior During Session: [facility] for tasks performed Cognition: [facility] for tasks performed  GP    Aldona Lento 01/26/2012, 6:45 PM

## 2012-01-28 ENCOUNTER — Ambulatory Visit (HOSPITAL_COMMUNITY)
Admission: RE | Admit: 2012-01-28 | Discharge: 2012-01-28 | Disposition: A | Discharge status: Home / Self Care | Payer: Medicare HMO | Source: Ambulatory Visit | Attending: Orthopedic Surgery | Admitting: Orthopedic Surgery

## 2012-01-28 DIAGNOSIS — R29898 Other symptoms and signs involving the musculoskeletal system: Secondary | ICD-10-CM

## 2012-01-28 DIAGNOSIS — R262 Difficulty in walking, not elsewhere classified: Secondary | ICD-10-CM

## 2012-01-28 NOTE — Evaluation (Signed)
Physical Therapy Evaluation  Patient Details  Name: [name] [name] MRN: [id number] Date of Birth: 1932/09/08  Today's Date: 01/28/2012 Time: 0355-9741 PT Time Calculation (min): 46 min  Visit#: 12  of 12   Re-eval: 01/28/12 Assessment Diagnosis: Lumbar radiculopathy Next MD Visit: 11/1/3 Prior Therapy: none  Authorization:   [place] Past Medical History:  Past Medical History  Diagnosis Date  . Hypertension   . Cancer    Past Surgical History:  Past Surgical History  Procedure Date  . Tonsillectomy   . Cholecystectomy   . Abdominal hysterectomy   . Appendectomy   . Foot surgery 2007    Pins in toes on left foot, 5 to 6 yrs ago    Subjective Symptoms/Limitations Symptoms: Pt states that the only pain she has is her R knee and arm. Pain Assessment Pain Score:   5 Pain Location: Knee Pain Orientation: Right   Prior Functioning  Prior Function Vocation: Retired Leisure: Hobbies-yes (Comment)    Sensation/Coordination/Flexibility/Functional Tests Functional Tests Functional Tests: LEFS 29/64 was  29/64 take out hopping and running  Assessment RLE Strength Right Hip Flexion: 3/5 (WAS 3/5) Right Hip Extension: 3+/5 (WAS 3/5) Right Hip ABduction: 3+/5 (WAS 3/5) Right Hip ADduction: 3/5 (WAS 3/5) Right Knee Flexion: 5/5 (WAS 3+/5) Right Knee Extension: 5/5 Right Ankle Dorsiflexion: 5/5 (was 4/5) LLE Strength Left Hip Flexion: 3/5 (WAS 3-/5) Left Hip Extension: 5/5 (WAS 3+) Left Hip ABduction: 3+/5 (WAS 3/5) Left Hip ADduction: 3/5 (WAS 3-/5) Left Knee Flexion: 5/5 (WAS 3+/5) Left Knee Extension: 5/5 Left Ankle Dorsiflexion: 5/5 Lumbar AROM Lumbar Flexion: decreased 15% was decreased 25% Lumbar Extension: wnl Lumbar - Right Side Bend: wnl Lumbar - Left Side Bend: wnl Lumbar - Right Rotation: decreased 20% was decreased 20% Lumbar - Left Rotation: wnl was decreased 20%  Exercise/Treatments Mobility/Balance  Posture/Postural Control Posture/Postural  Control: Postural limitations Postural Limitations: increased kyphosis decreased lordosis     Machines for Strengthening Cybex Knee Extension: 2.5 x 15 x 2 Cybex Knee Flexion: 3Pl x 15 x 2 Standing Heel Raises: Limitations Heel Raises Limitations: pick ball up off 15" raise up as if putting in cabinet.; squat/heelraise combo Forward Lunge: 10 reps;15 reps Other Standing Lumbar Exercises: forward and lateral step ups  4'" x 10 Other Standing Lumbar Exercises: sidestep w/ tband x 3 rt   Supine Bridge: 15 reps Bridge Limitations: w adduction  Straight Leg Raise: 10 reps Sidelying Hip Abduction: 15 reps Prone  Straight Leg Raise: 15 reps    Physical Therapy Assessment and Plan PT Assessment and Plan Clinical Impression Statement: Pt continues to need verbal and tactile cuing to keep proper lumbar stabilization but would like to continue with HEP. Pt states that she is joining the [facility] as she realizes that she needs to get her legs stronger. PT has met her short term goals but has not met her long term goals PT Plan: D/C to home exercise program.      Goals Home Exercise Program PT Goal: Perform Home Exercise Program - Progress: Met PT Short Term Goals PT Short Term Goal 1 - Progress: Met PT Short Term Goal 2 - Progress: Met PT Short Term Goal 3 - Progress: Met PT Long Term Goals PT Long Term Goal 1 - Progress: Met PT Long Term Goal 2 - Progress: Progressing toward goal Long Term Goal 3 Progress: Progressing toward goal Long Term Goal 4 Progress: Progressing toward goal Long Term Goal 5 Progress: Progressing toward goal PT Long Term Goal 6:  goal of mm strength improved 1/2 grade is progressing. PT Long Term Goal 7: Goal of LEFS to increase by 10 points not met.  Problem List Patient Active Problem List  Diagnosis  . MALIGNANT NEOPLASM OF UTERUS PART UNSPECIFIED  . HYPERLIPIDEMIA  . OVERWEIGHT  . HYPERTENSION  . OTHER CHRONIC NONALCOHOLIC LIVER DISEASE  . KNEE,  ARTHRITIS, DEGEN./OSTEO  . Pain in joint, lower leg  . BACK PAIN  . Edema  . IMPAIRED FASTING GLUCOSE  . TRANSAMINASES, SERUM, ELEVATED  . FERRITIN, ELEVATED  . LIVER FUNCTION TESTS, ABNORMAL, HX OF  . RESTLESS LEG SYNDROME, HX OF  . UNSPECIFIED VISUAL LOSS  . Pain in joint, lower leg  . Muscle weakness (generalized)  . Cystitis  . OAB (overactive bladder)  . Prediabetes  . Routine general medical examination at a health care facility  . Vitamin d deficiency  . Weakness of both legs  . Difficulty walking    PT - End of Session Activity Tolerance: Patient tolerated treatment well General Behavior During Session: [facility] for tasks performed PT Plan of Care PT Home Exercise Plan: new exercise program given concentrating on hip mm strength.  GP    RUSSELL,CINDY 01/28/2012, 4:13 PM  Physician Documentation Your signature is required to indicate approval of the treatment plan as stated above.  Please sign and either send electronically or make a copy of this report for your files and return this physician signed original.   Please mark one 1.__approve of plan  2. ___approve of plan with the following conditions.   ______________________________                                                          _____________________ Physician Signature                                                                                                             Date

## 2012-01-31 ENCOUNTER — Encounter: Payer: Self-pay | Admitting: Family Medicine

## 2012-01-31 DIAGNOSIS — M79603 Pain in arm, unspecified: Secondary | ICD-10-CM | POA: Insufficient documentation

## 2012-01-31 DIAGNOSIS — C541 Malignant neoplasm of endometrium: Secondary | ICD-10-CM | POA: Insufficient documentation

## 2012-01-31 NOTE — Assessment & Plan Note (Signed)
Uncontrolled, likely due to arthritic disease in c spine, hopefully therapy will help

## 2012-01-31 NOTE — Assessment & Plan Note (Signed)
Improved. Pt applauded on succesful weight loss through lifestyle change, and encouraged to continue same. Weight loss goal set for the next several months.  

## 2012-01-31 NOTE — Assessment & Plan Note (Signed)
Ongoing knee pain with some instability, limiting ability to exercise

## 2012-01-31 NOTE — Assessment & Plan Note (Signed)
Has had neurosurg eval and is referred for therapy, weight loss also encouraged

## 2012-01-31 NOTE — Assessment & Plan Note (Signed)
Controlled, no change in medication DASH diet and commitment to daily physical activity for a minimum of 30 minutes discussed and encouraged, as a part of hypertension management. The importance of attaining a healthy weight is also discussed.  

## 2012-01-31 NOTE — Assessment & Plan Note (Signed)
Improved HBA1C, pt applauded on this Patient educated about the importance of limiting  Carbohydrate intake , the need to commit to daily physical activity for a minimum of 30 minutes , and to commit weight loss. The fact that changes in all these areas will reduce or eliminate all together the development of diabetes is stressed.

## 2012-01-31 NOTE — Assessment & Plan Note (Signed)
Deteriorated, statin intolerant, need to focus on low fat diet more consistently

## 2012-02-02 ENCOUNTER — Ambulatory Visit (HOSPITAL_COMMUNITY): Payer: Medicare HMO | Admitting: *Deleted

## 2012-02-04 ENCOUNTER — Ambulatory Visit: Payer: Medicare HMO | Attending: Gynecologic Oncology | Admitting: Gynecologic Oncology

## 2012-02-04 ENCOUNTER — Ambulatory Visit (HOSPITAL_COMMUNITY): Payer: Medicare HMO | Admitting: Physical Therapy

## 2012-02-04 ENCOUNTER — Encounter: Payer: Self-pay | Admitting: Gynecologic Oncology

## 2012-02-04 VITALS — BP 138/76 | HR 74 | Temp 98.5°F | Resp 16 | Ht 65.35 in | Wt 179.0 lb

## 2012-02-04 DIAGNOSIS — Z90710 Acquired absence of both cervix and uterus: Secondary | ICD-10-CM | POA: Insufficient documentation

## 2012-02-04 DIAGNOSIS — C541 Malignant neoplasm of endometrium: Secondary | ICD-10-CM

## 2012-02-04 DIAGNOSIS — I10 Essential (primary) hypertension: Secondary | ICD-10-CM | POA: Insufficient documentation

## 2012-02-04 DIAGNOSIS — Z09 Encounter for follow-up examination after completed treatment for conditions other than malignant neoplasm: Secondary | ICD-10-CM | POA: Insufficient documentation

## 2012-02-04 DIAGNOSIS — C549 Malignant neoplasm of corpus uteri, unspecified: Secondary | ICD-10-CM | POA: Insufficient documentation

## 2012-02-04 NOTE — Patient Instructions (Signed)
RTC prn

## 2012-02-04 NOTE — Progress Notes (Signed)
Consult Note: Gyn-Onc  [name] [name] 78 y.o. female  CC:  Chief Complaint  Patient presents with  . Endometrial cancer    Follow up    HPI: [name] [name] is a very pleasant 78 year old woman with laparoscopic hysterectomy BSO and appropriate staging in September of 2008. Final pathology was consistent with a FIGO stage IB, grade 1 endometrioid adenocarcinoma. She had less than 15% myometrial invasion and 15 negative lymph nodes. Based on the current FIGO stage should be a stage IA 1 endometrioid adenocarcinoma. She's been followed closely since that time with no evidence of recurrent disease. I last saw her in November of 2012 which time her examination was negative as was her Pap smear. She saw Dr. Moshe Cipro and complaint of some urinary incontinence and was referred to [facility]. The urinalysis was performed that was negative. In addition, he ordered a CT scan of the abdomen and pelvis which was performed on January 12, 2010. A revealed no evidence of recurrent disease. She states that he wishes to do a cystoscopy but she is not sure she wants to undergo that. She only has symptoms consistent with urge incontinence. She denies any gross hematuria she denies any vaginal bleeding or change in bladder habits.   She is up-to-date on her mammograms and is scheduled for another one for December 2013. She had a colonoscopy in 2011 and does not require a repeat for 5 years.   Interval History:  ROS: She denies any chest pain shortness of breath nausea vomiting fevers chills headaches visual changes unintentional weight loss weight gain. She does have you urge urinary incontinence and no other complaints. She occasionally has some constipation. Her constipation is relieved with diet. She denies any numbness or neuropathy. 10 point review of systems is otherwise negative.  She's had some back pain and has been worked up and was found have a bulging discs. She's done physical therapy which is helped. She does  continue to have issues with urge incontinence in part related to her being on diuretics for her hypertension. She is no issues with stress urinary incontinence. She states that her incontinence is better if she does timed voids, however she's not been very compliant with that. She will start to do those better.   Current Meds:  Outpatient Encounter Prescriptions as of 02/04/2012  Medication Sig Dispense Refill  . ergocalciferol (VITAMIN D2) 50000 UNITS capsule Take 1 capsule (50,000 Units total) by mouth once a week. One capsule once weekly  12 capsule  1  . fish oil-omega-3 fatty acids 1000 MG capsule Take 2 g by mouth daily.        . meclizine (ANTIVERT) 12.5 MG tablet Take 1 tablet (12.5 mg total) by mouth 3 (three) times daily as needed for dizziness or nausea.  30 tablet  1  . Multiple Vitamin (MULTIVITAMIN) capsule Take 1 capsule by mouth daily.        Marland Kitchen triamterene-hydrochlorothiazide (MAXZIDE) 75-50 MG per tablet Take 1 tablet by mouth daily.  90 tablet  1    Allergy:  Allergies  Allergen Reactions  . Ciprofloxacin Hcl     Chills, sick, could not tolerate it  . Hydrocodone Nausea Only  . Penicillins     Social Hx:   History   Social History  . Marital Status: Married    Spouse Name: N/A    Number of Children: N/A  . Years of Education: N/A   Occupational History  . Not on file.   Social History  Main Topics  . Smoking status: Never Smoker   . Smokeless tobacco: Not on file  . Alcohol Use: No  . Drug Use: No  . Sexually Active: Yes   Other Topics Concern  . Not on file   Social History Narrative  . No narrative on file    Past Surgical Hx:  Past Surgical History  Procedure Date  . Tonsillectomy   . Cholecystectomy   . Appendectomy   . Foot surgery 2007    Pins in toes on left foot, 5 to 6 yrs ago  . Abdominal hysterectomy     Past Medical Hx:  Past Medical History  Diagnosis Date  . Hypertension   . Cancer     Family Hx:  Family History    Problem Relation Age of Onset  . Heart disease Father   . Cancer Brother     bladder  . Hypertension Sister   . Diabetes type II Sister     Vitals:  Blood pressure 138/76, pulse 74, temperature 98.5 F (36.9 C), temperature source Oral, resp. rate 16, height 5' 5.35" (1.66 m), weight 179 lb (81.194 kg).  Physical Exam:   Well-nourished well-developed female in no acute distress.  Neck is supple there is no lymphadenopathy no thyromegaly.  Lungs are clear to auscultation.  Cardiovascular examination regular rate and rhythm. No murmurs.  Abdomen is soft nontender nondistended no palpable masses or hepatosplenomegaly  Extremities shows no clubbing cyanosis or edema.  Pelvic external genitalia is within normal limits though atrophic. The vagina is markedly atrophic. The vaginal cuff is visualized. There are no visible lesions. Bimanual examination reveals no masses or nodularity.   Assessment/Plan:  This is a very pleasant 78 year old with stage IA grade 1 endometrioid adenocarcinoma diagnosed and treated for years ago has no evidence of recurrent disease.She will follow up with Dr. Moshe Cipro as scheduled. She is five years out from her diagnosis and will be released from our clinic. She knows to contact us if she has any issues and we will be more than happy to see her. She was given a list of signs and sx to seek medical attention for with regards to her endometrial cancer.     Arlee Santosuosso A., MD 02/04/2012, 11:17 AM

## 2012-02-22 ENCOUNTER — Other Ambulatory Visit: Payer: Self-pay | Admitting: Family Medicine

## 2012-02-22 DIAGNOSIS — Z139 Encounter for screening, unspecified: Secondary | ICD-10-CM

## 2012-03-10 ENCOUNTER — Ambulatory Visit (HOSPITAL_COMMUNITY): Payer: Medicare HMO

## 2012-03-14 ENCOUNTER — Ambulatory Visit (HOSPITAL_COMMUNITY)
Admission: RE | Admit: 2012-03-14 | Discharge: 2012-03-14 | Disposition: A | Discharge status: Home / Self Care | Payer: Medicare HMO | Source: Ambulatory Visit | Attending: Family Medicine | Admitting: Family Medicine

## 2012-03-14 DIAGNOSIS — Z139 Encounter for screening, unspecified: Secondary | ICD-10-CM

## 2012-03-14 DIAGNOSIS — Z1231 Encounter for screening mammogram for malignant neoplasm of breast: Secondary | ICD-10-CM | POA: Insufficient documentation

## 2012-04-18 ENCOUNTER — Telehealth: Payer: Self-pay | Admitting: Family Medicine

## 2012-04-18 DIAGNOSIS — E559 Vitamin D deficiency, unspecified: Secondary | ICD-10-CM

## 2012-04-18 MED ORDER — TRIAMTERENE-HCTZ 75-50 MG PO TABS: 1.0000 | ORAL_TABLET | Freq: Every day | ORAL | Status: DC

## 2012-04-18 MED ORDER — ERGOCALCIFEROL 1.25 MG (50000 UT) PO CAPS: 50000.0000 [IU] | ORAL_CAPSULE | ORAL | Status: DC

## 2012-04-19 ENCOUNTER — Telehealth: Payer: Self-pay | Admitting: Family Medicine

## 2012-04-20 NOTE — Telephone Encounter (Signed)
Was resent

## 2012-05-24 NOTE — Telephone Encounter (Signed)
Refills ordered by [facility] nurse

## 2012-06-08 ENCOUNTER — Encounter: Payer: Self-pay | Admitting: Internal Medicine

## 2012-06-15 ENCOUNTER — Other Ambulatory Visit: Payer: Self-pay | Admitting: Family

## 2012-06-16 LAB — VITAMIN D 25 HYDROXY (VIT D DEFICIENCY, FRACTURES): Vit D, 25-Hydroxy: 35 ng/mL (ref 30–89)

## 2012-06-16 LAB — BASIC METABOLIC PANEL
CO2: 32 mEq/L (ref 19–32)
Calcium: 9.8 mg/dL (ref 8.4–10.5)
Creat: 0.82 mg/dL (ref 0.50–1.10)
Glucose, Bld: 109 mg/dL — ABNORMAL HIGH (ref 70–99)

## 2012-06-20 ENCOUNTER — Ambulatory Visit (INDEPENDENT_AMBULATORY_CARE_PROVIDER_SITE_OTHER): Payer: Medicare HMO | Admitting: Family Medicine

## 2012-06-20 VITALS — BP 126/68 | HR 93 | Resp 18 | Ht 65.0 in | Wt 179.1 lb

## 2012-06-20 DIAGNOSIS — E785 Hyperlipidemia, unspecified: Secondary | ICD-10-CM

## 2012-06-20 DIAGNOSIS — E663 Overweight: Secondary | ICD-10-CM

## 2012-06-20 DIAGNOSIS — R7303 Prediabetes: Secondary | ICD-10-CM

## 2012-06-20 DIAGNOSIS — R7309 Other abnormal glucose: Secondary | ICD-10-CM

## 2012-06-20 DIAGNOSIS — R7301 Impaired fasting glucose: Secondary | ICD-10-CM

## 2012-06-20 DIAGNOSIS — I10 Essential (primary) hypertension: Secondary | ICD-10-CM

## 2012-06-20 DIAGNOSIS — R32 Unspecified urinary incontinence: Secondary | ICD-10-CM | POA: Insufficient documentation

## 2012-06-20 LAB — LIPID PANEL
Total CHOL/HDL Ratio: 3.5 Ratio
VLDL: 21 mg/dL (ref 0–40)

## 2012-06-20 MED ORDER — OXYBUTYNIN 3.9 MG/24HR TD PTTW: 1.0000 | MEDICATED_PATCH | TRANSDERMAL | Status: DC

## 2012-06-20 NOTE — Progress Notes (Signed)
  Subjective:    Patient ID: [name] [name], female    DOB: 07-Oct-1932, 79 y.o.   MRN: [id number]  HPI The PT is here for follow up and re-evaluation of chronic medical conditions, medication management and review of any available recent lab and radiology data.  Preventive health is updated, specifically  Cancer screening and Immunization.   Questions or concerns regarding consultations or procedures which the PT has had in the interim are  addressed. The PT denies any adverse reactions to current medications since the last visit.  There are no new concerns.  There are no specific complaints       Review of Systems See HPI Denies recent fever or chills. Denies sinus pressure, nasal congestion, ear pain or sore throat. Denies chest congestion, productive cough or wheezing. Denies chest pains, palpitations and leg swelling Denies abdominal pain, nausea, vomiting,diarrhea or constipation.   Denies dysuria, frequency, hesitancy c/o increasingly disabling  incontinence. Chronic arthritic pain in knee, no falls, some instability at times Denies headaches, seizures, numbness, or tingling. Denies depression, anxiety or insomnia. Denies skin break down or rash.        Objective:   Physical Exam  Patient alert and oriented and in no cardiopulmonary distress.  HEENT: No facial asymmetry, EOMI, no sinus tenderness,  oropharynx pink and moist.  Neck supple no adenopathy.  Chest: Clear to auscultation bilaterally.  CVS: S1, S2 no murmurs, no S3.  ABD: Soft non tender. Bowel sounds normal.  Ext: No edema  MS: Adequate ROM spine, shoulders, hips and reduced in right  knee.  Skin: Intact, no ulcerations or rash noted.  Psych: Good eye contact, normal affect. Memory intact not anxious or depressed appearing.  CNS: CN 2-12 intact, power, tone and sensation normal throughout.       Assessment & Plan:

## 2012-06-20 NOTE — Patient Instructions (Addendum)
Annual wellness in end September, please call if you need me before  Please follow up with repeat colonoscopy per Dr Oneida Alar, this is important.  You will be given script for incontinence patch, try those you already have, if they work ok to fill script, and use as directed  We will add cholesterol and lipid to recent labs and call you with result.  Most recent bone density was in 2013, June, bones are thin, pls ensure you take calcium in the diet 1257m daly, skimmed milk and low fat yogurt, also calcium fortified orange juice  On completion of current vit D no need to refill, level is normal, continue daily OTC vit D3 600IU once daily  CBC, fasting lipid, cmp , hBA1C , TSH in end September  It is important that you exercise regularly at least 30 minutes 5 times a week. If you develop chest pain, have severe difficulty breathing, or feel very tired, stop exercising immediately and seek medical attention . Please join the [employer], you are already payiing for this with your insurance  A healthy diet is rich in fruit, vegetables and whole grains. Poultry fish, nuts and beans are a healthy choice for protein rather then red meat. A low sodium diet and drinking 64 ounces of water daily is generally recommended. Oils and sweet should be limited. Carbohydrates especially for those who are diabetic or overweight, should be limited to 30-45 gram per meal. It is important to eat on a regular schedule, at least 3 times daily. Snacks should be primarily fruits, vegetables or nuts.

## 2012-06-22 ENCOUNTER — Other Ambulatory Visit: Payer: Self-pay

## 2012-06-22 ENCOUNTER — Telehealth: Payer: Self-pay

## 2012-06-22 MED ORDER — ATORVASTATIN CALCIUM 10 MG PO TABS: 10.0000 mg | ORAL_TABLET | Freq: Every day | ORAL | Status: DC

## 2012-06-22 NOTE — Telephone Encounter (Signed)
Pt aware and verbalized understanding. Rx sent to pharmacy

## 2012-06-22 NOTE — Progress Notes (Signed)
error 

## 2012-06-22 NOTE — Telephone Encounter (Signed)
Message copied by Santiago Bumpers on Wed Jun 22, 2012  1:39 PM ------      Message from: Roxy Cedar B      Created: Tue Jun 21, 2012  9:19 AM       Needs cholesterol meds.       Start Lipitor 62m once a day. Recheck in 6 weeks. ------

## 2012-06-26 ENCOUNTER — Encounter: Payer: Self-pay | Admitting: Family Medicine

## 2012-06-26 NOTE — Assessment & Plan Note (Signed)
Still very problematic. Has bought OTC topical med, will try this to see if successful, meds have not worked in the past. Will reconsider urology eval , currently unable to attend grand daughter's graduation because of the severity of the problem

## 2012-06-26 NOTE — Assessment & Plan Note (Signed)
Unchanged. Patient re-educated about  the importance of commitment to a  minimum of 150 minutes of exercise per week. The importance of healthy food choices with portion control discussed. Encouraged to start a food diary, count calories and to consider  joining a support group. Sample diet sheets offered. Goals set by the patient for the next several months.    

## 2012-06-26 NOTE — Assessment & Plan Note (Signed)
Unchanged Patient educated about the importance of limiting  Carbohydrate intake , the need to commit to daily physical activity for a minimum of 30 minutes , and to commit weight loss. The fact that changes in all these areas will reduce or eliminate all together the development of diabetes is stressed.

## 2012-06-26 NOTE — Assessment & Plan Note (Signed)
Deteriorated, pt needs to resume statin, noted after the visit as lab was added, will contact pt with the info

## 2012-06-26 NOTE — Assessment & Plan Note (Signed)
Controlled, no change in medication DASH diet and commitment to daily physical activity for a minimum of 30 minutes discussed and encouraged, as a part of hypertension management. The importance of attaining a healthy weight is also discussed.  

## 2012-06-27 ENCOUNTER — Telehealth: Payer: Self-pay

## 2012-06-27 ENCOUNTER — Telehealth: Payer: Self-pay | Admitting: Family Medicine

## 2012-06-27 DIAGNOSIS — E785 Hyperlipidemia, unspecified: Secondary | ICD-10-CM

## 2012-06-27 NOTE — Telephone Encounter (Signed)
Pt aware  Not to take the lipitor, until she has liver enzyme checked, will go for lab by tomorrow, pls leave lab order for hepatic panel she is going today

## 2012-06-27 NOTE — Telephone Encounter (Signed)
Dr Moshe Cipro spoke with patient and she is on her way to collect her lab order.

## 2012-06-27 NOTE — Telephone Encounter (Signed)
Spoke with pt who confirmed getting call from an office she has never been top re abnormal lipids, having med started which she is taking . I advised I agreed with the med , however advised no labs for 4 months in August, fasting lipid and hepatic. Her spouse is here now, he will be given the lab sheet if these labs have not already been ordered  Labs were already ordered does not need a sheet

## 2012-06-27 NOTE — Telephone Encounter (Signed)
Patient collected

## 2012-06-27 NOTE — Telephone Encounter (Signed)
PT called and stated that she isn't sure who "authorized" her medication. It has Padondas name on it, but she wants to know if Dr. Moshe Cipro is aware of it. Please assist. She's also inquiring about blood work results from 06/20/12.

## 2012-06-28 LAB — HEPATIC FUNCTION PANEL
Bilirubin, Direct: 0.1 mg/dL (ref 0.0–0.3)
Total Bilirubin: 0.5 mg/dL (ref 0.3–1.2)

## 2012-06-29 NOTE — Telephone Encounter (Signed)
Will mail lab order to be done in 6 weeks

## 2012-06-29 NOTE — Addendum Note (Signed)
Addended by: Eual Fines on: 06/29/2012 11:03 AM   Modules accepted: Orders

## 2012-08-16 ENCOUNTER — Encounter: Payer: Self-pay | Admitting: Orthopedic Surgery

## 2012-08-16 ENCOUNTER — Ambulatory Visit (INDEPENDENT_AMBULATORY_CARE_PROVIDER_SITE_OTHER): Payer: Medicare HMO

## 2012-08-16 ENCOUNTER — Ambulatory Visit (INDEPENDENT_AMBULATORY_CARE_PROVIDER_SITE_OTHER): Payer: Medicare HMO | Admitting: Orthopedic Surgery

## 2012-08-16 VITALS — BP 122/60 | Ht 66.0 in | Wt 178.0 lb

## 2012-08-16 DIAGNOSIS — M25521 Pain in right elbow: Secondary | ICD-10-CM

## 2012-08-16 DIAGNOSIS — S46219A Strain of muscle, fascia and tendon of other parts of biceps, unspecified arm, initial encounter: Secondary | ICD-10-CM | POA: Insufficient documentation

## 2012-08-16 DIAGNOSIS — S43499A Other sprain of unspecified shoulder joint, initial encounter: Secondary | ICD-10-CM

## 2012-08-16 DIAGNOSIS — M25529 Pain in unspecified elbow: Secondary | ICD-10-CM

## 2012-08-16 DIAGNOSIS — S46211A Strain of muscle, fascia and tendon of other parts of biceps, right arm, initial encounter: Secondary | ICD-10-CM

## 2012-08-16 MED ORDER — MENTHOL (TOPICAL ANALGESIC) 3.7 % EX GEL: 3.7000 | Freq: Three times a day (TID) | CUTANEOUS | Status: DC

## 2012-08-16 MED ORDER — NAPROXEN 500 MG PO TABS: 500.0000 mg | ORAL_TABLET | Freq: Two times a day (BID) | ORAL | Status: DC

## 2012-08-16 NOTE — Progress Notes (Signed)
Patient ID: [name] [name], female   DOB: Dec 16, 1932, 79 y.o.   MRN: [id number] Chief Complaint  Patient presents with  . Arm Pain    Right arm pain, no injury   Patient history the patient complains of pain in the right biceps for 3-4 months no trauma came on gradually review of systems positive for eye pain constipation frequency and urgency as well as dizziness   She describes a dull aching pain which is intermittent and worse when she reaches or holds something away from her body. A couple of months ago bruise came up over the right proximal humerus/anterior shoulder. She does have some difficulty reaching overhead  .Her past family and social history have been recorded she is allergic to penicillin and ciprofloxacin   BP 122/60  Ht 5' 6"  (1.676 m)  Wt 178 lb (80.74 kg)  BMI 28.74 kg/m2  Vital signs are stable as recorded  General appearance is normal  The patient is alert and oriented x3  The patient's mood and affect are normal  Gait assessment: No abnormalities with ambulation The cardiovascular exam reveals normal pulses and temperature without edema or  swelling.  The lymphatic system is negative for palpable lymph nodes  The sensory exam is normal.  There are no pathologic reflexes.  Balance is normal.   Exam of the right arm shows tenderness directly over the biceps she does not have a characteristic Popeye sign she has good flexion strength she has some pain with supination and resisted pronation is painful for elevation of the shoulder and 120 with a maximum range of motion 150, her rotator cuff strength is normal skin is intact there is no mass in the axilla there is no lymphadenopathy   She probably has some underlying rotator cuff disease a right humeral x-ray was normal  She may have injured biceps tendon  Impression Encounter Diagnoses  Name Primary?  . Pain in joint, upper arm, right Yes  . Biceps muscle strain, right, initial encounter    Recommend  local topical Max Vergie Living along with some Naprosyn. Follow up as needed

## 2012-08-16 NOTE — Patient Instructions (Addendum)
Apply over the counter MAX FREEZE to arm 3 times a day (any local drugstore chain will have it )  Start naprosyn 500 mg twice daily (medication is at the pharmacy)

## 2012-08-26 ENCOUNTER — Other Ambulatory Visit: Payer: Self-pay | Admitting: Family Medicine

## 2012-08-26 LAB — HEPATIC FUNCTION PANEL
AST: 50 U/L — ABNORMAL HIGH (ref 0–37)
Alkaline Phosphatase: 41 U/L (ref 39–117)
Bilirubin, Direct: 0.1 mg/dL (ref 0.0–0.3)
Total Bilirubin: 0.6 mg/dL (ref 0.3–1.2)

## 2012-09-01 ENCOUNTER — Ambulatory Visit (INDEPENDENT_AMBULATORY_CARE_PROVIDER_SITE_OTHER): Payer: Medicare HMO | Admitting: Family Medicine

## 2012-09-01 ENCOUNTER — Encounter: Payer: Self-pay | Admitting: Family Medicine

## 2012-09-01 VITALS — BP 120/68 | HR 84 | Resp 16 | Ht 66.0 in | Wt 179.0 lb

## 2012-09-01 DIAGNOSIS — E785 Hyperlipidemia, unspecified: Secondary | ICD-10-CM

## 2012-09-01 DIAGNOSIS — Z1211 Encounter for screening for malignant neoplasm of colon: Secondary | ICD-10-CM

## 2012-09-01 DIAGNOSIS — R7301 Impaired fasting glucose: Secondary | ICD-10-CM

## 2012-09-01 DIAGNOSIS — R7303 Prediabetes: Secondary | ICD-10-CM

## 2012-09-01 DIAGNOSIS — E663 Overweight: Secondary | ICD-10-CM

## 2012-09-01 DIAGNOSIS — R7309 Other abnormal glucose: Secondary | ICD-10-CM

## 2012-09-01 DIAGNOSIS — K7689 Other specified diseases of liver: Secondary | ICD-10-CM

## 2012-09-01 DIAGNOSIS — I10 Essential (primary) hypertension: Secondary | ICD-10-CM

## 2012-09-01 DIAGNOSIS — R32 Unspecified urinary incontinence: Secondary | ICD-10-CM

## 2012-09-01 DIAGNOSIS — Z1212 Encounter for screening for malignant neoplasm of rectum: Secondary | ICD-10-CM

## 2012-09-01 LAB — LIPID PANEL
HDL: 69 mg/dL (ref >39)
LDL Cholesterol: 95 mg/dL (ref 0–99)
Triglycerides: 83 mg/dL (ref <150)
VLDL: 17 mg/dL (ref 0–40)

## 2012-09-01 MED ORDER — TRIAMTERENE-HCTZ 75-50 MG PO TABS: 1.0000 | ORAL_TABLET | Freq: Every day | ORAL | Status: DC

## 2012-09-01 NOTE — Progress Notes (Signed)
  Subjective:    Patient ID: [name] [name], female    DOB: [birth year], 79 y.o.   MRN: [id number]  HPI The PT is here for follow up and re-evaluation of chronic medical conditions, medication management and review of any available recent lab and radiology data.  Preventive health is updated, specifically  Cancer screening and Immunization.   Questions or concerns regarding consultations or procedures which the PT has had in the interim are  addressed. The PT denies any adverse reactions to current medications since the last visit.  C/o urinary incontinence , worsening, now wants to have urology eval and we will work on this         Review of Systems See HPI Denies recent fever or chills. Denies sinus pressure, nasal congestion, ear pain or sore throat. Denies chest congestion, productive cough or wheezing. Denies chest pains, palpitations and leg swelling Denies abdominal pain, nausea, vomiting,diarrhea or constipation.    Denies joint pain, swelling and limitation in mobility. Denies headaches, seizures, numbness, or tingling. Denies depression, anxiety or insomnia. Denies skin break down or rash.        Objective:   Physical Exam Patient alert and oriented and in no cardiopulmonary distress.  HEENT: No facial asymmetry, EOMI, no sinus tenderness,  oropharynx pink and moist.  Neck supple no adenopathy.  Chest: Clear to auscultation bilaterally.  CVS: S1, S2 no murmurs, no S3.  ABD: Soft non tender. Bowel sounds normal. Rectal: no mass heme negative stool  Ext: No edema  MS: Adequate ROM spine, shoulders, hips and knees.  Skin: Intact, no ulcerations or rash noted.  Psych: Good eye contact, normal affect. Memory intact not anxious or depressed appearing.  CNS: CN 2-12 intact, power, tone and sensation normal throughout.        Assessment & Plan:

## 2012-09-01 NOTE — Patient Instructions (Addendum)
Annual wellness in 4.5 month, call if you need me before  Fasting lipid, cmp , HBA1C, and TSH, CBC  You will be referred for evaluation of incontinence, we will call with the information.   Lipid will be added to labs from last week.Do not change how you are taking lipitor  Rectal exam today

## 2012-09-04 NOTE — Assessment & Plan Note (Signed)
Patient educated about the importance of limiting  Carbohydrate intake , the need to commit to daily physical activity for a minimum of 30 minutes , and to commit weight loss. The fact that changes in all these areas will reduce or eliminate all together the development of diabetes is stressed.

## 2012-09-04 NOTE — Assessment & Plan Note (Signed)
Unchanged. Patient re-educated about  the importance of commitment to a  minimum of 150 minutes of exercise per week. The importance of healthy food choices with portion control discussed. Encouraged to start a food diary, count calories and to consider  joining a support group. Sample diet sheets offered. Goals set by the patient for the next several months.    

## 2012-09-04 NOTE — Assessment & Plan Note (Signed)
Improved,   Pt to continue medication, as before

## 2012-09-04 NOTE — Assessment & Plan Note (Signed)
Controlled, no change in medication DASH diet and commitment to daily physical activity for a minimum of 30 minutes discussed and encouraged, as a part of hypertension management. The importance of attaining a healthy weight is also discussed.  

## 2012-09-30 ENCOUNTER — Encounter: Payer: Self-pay | Admitting: Internal Medicine

## 2012-10-04 ENCOUNTER — Encounter: Payer: Self-pay | Admitting: Gastroenterology

## 2012-10-04 ENCOUNTER — Other Ambulatory Visit: Payer: Self-pay | Admitting: Gastroenterology

## 2012-10-04 ENCOUNTER — Ambulatory Visit (INDEPENDENT_AMBULATORY_CARE_PROVIDER_SITE_OTHER): Payer: Medicare HMO | Admitting: Gastroenterology

## 2012-10-04 VITALS — BP 122/70 | HR 79 | Temp 98.5°F | Ht 65.0 in | Wt 181.2 lb

## 2012-10-04 DIAGNOSIS — K5900 Constipation, unspecified: Secondary | ICD-10-CM

## 2012-10-04 DIAGNOSIS — Z86010 Personal history of colonic polyps: Secondary | ICD-10-CM

## 2012-10-04 MED ORDER — PEG-KCL-NACL-NASULF-NA ASC-C 100 G PO SOLR: 1.0000 | ORAL | Status: DC

## 2012-10-04 MED ORDER — LINACLOTIDE 145 MCG PO CAPS: 145.0000 ug | ORAL_CAPSULE | Freq: Every day | ORAL | Status: DC

## 2012-10-04 NOTE — Patient Instructions (Addendum)
Start taking Linzess 1 capsule each morning, 30 minutes to an hour before breakfast. This is for constipation. I have provided you with the prescription and a voucher to get a month supply free.  Start taking a probiotic daily. Some examples are Philip's colon health, Digestive Advantage, [employer] brand, Align, and Restora.   We have scheduled you for a colonoscopy with Dr. Oneida Alar in the near future.  We will see you back in 3 months to see how you are doing with the new medications.

## 2012-10-04 NOTE — Progress Notes (Signed)
Primary Care Physician:  [name], MD Primary Gastroenterologist:  Dr. Oneida Alar (Originally Dr. Gala Romney, but patient has seen Dr. Oneida Alar most recently)  Chief Complaint  Patient presents with  . Gas  . Colonoscopy    HPI:   [name] [name] is a pleasant 79 year old female who presents today to discuss surveillance colonoscopy due to personal history of colon polyps. She also has a history of elevated transaminases in the past, last seen in August 2011 by Dr. Oneida Alar. She was felt to have a fatty liver as the culprit of elevated LFTs.   Notes constipation, gas, goes several days without a BM then will go "all day". Symptoms present for 3 months. She was placed on calcium-rich diet recently. No other changes in medications. Baseline is BM every few days. No rectal bleeding. Heme negative. No weight loss or lack of appetite. No GERD symptoms, no dysphagia. No abdominal pain.   Past Medical History  Diagnosis Date  . Hypertension   . Uterine cancer   . Elevated LFTs     secondary to fatty liver, negative work-up in 2011  . Hypercholesterolemia     Past Surgical History  Procedure Laterality Date  . Tonsillectomy    . Cholecystectomy    . Appendectomy    . Foot surgery  2007    Pins in toes on left foot, 5 to 6 yrs ago  . Abdominal hysterectomy    . Colonoscopy    06/21/2007    ONG:EXBMWU rectum/Sigmoid diverticula, diminutive hepatic flexure polyp s/p bx. Benign.     Current Outpatient Prescriptions  Medication Sig Dispense Refill  . atorvastatin (LIPITOR) 10 MG tablet Take 1 tablet (10 mg total) by mouth daily.  90 tablet  0  . fish oil-omega-3 fatty acids 1000 MG capsule Take 2 g by mouth daily.        . Menthol, Topical Analgesic, 3.7 % GEL Apply 3.7 each topically 3 (three) times daily.  1 Tube  5  . Multiple Vitamin (MULTIVITAMIN) capsule Take 1 capsule by mouth daily.        Marland Kitchen triamterene-hydrochlorothiazide (MAXZIDE) 75-50 MG per tablet Take 1 tablet by mouth daily.  90  tablet  1   No current facility-administered medications for this visit.    Allergies as of 10/04/2012 - Review Complete 10/04/2012  Allergen Reaction Noted  . Ciprofloxacin hcl  06/23/2010  . Hydrocodone Nausea Only 01/19/2012  . Penicillins  09/08/2007    Family History  Problem Relation Age of Onset  . Heart disease Father   . Cancer Brother     bladder  . Hypertension Sister   . Diabetes type II Sister   . Colon cancer Neg Hx     History   Social History  . Marital Status: Married    Spouse Name: N/A    Number of Children: N/A  . Years of Education: N/A   Occupational History  . Not on file.   Social History Main Topics  . Smoking status: Never Smoker   . Smokeless tobacco: Not on file  . Alcohol Use: No  . Drug Use: No  . Sexually Active: Yes   Other Topics Concern  . Not on file   Social History Narrative  . No narrative on file    Review of Systems: Gen: Denies any fever, chills, fatigue, weight loss, lack of appetite.  CV: Denies chest pain, heart palpitations, peripheral edema, syncope.  Resp: Denies shortness of breath at rest or with exertion. Denies wheezing or  cough.  GI: see HPI GU : urinary incontinence, frequency  MS: joint pain, back pain, arthritis in knee Derm: Denies rash, itching, dry skin Psych: Denies depression, anxiety, memory loss, and confusion Heme: Denies bruising, bleeding, and enlarged lymph nodes.  Physical Exam: BP 122/70  Pulse 79  Temp(Src) 98.5 F (36.9 C) (Oral)  Ht 5' 5"  (1.651 m)  Wt 181 lb 3.2 oz (82.192 kg)  BMI 30.15 kg/m2 General:   Alert and oriented. Pleasant and cooperative. Well-nourished and well-developed.  Head:  Normocephalic and atraumatic. Eyes:  Without icterus, sclera clear and conjunctiva pink.  Ears:  Normal auditory acuity. Nose:  No deformity, discharge,  or lesions. Mouth:  No deformity or lesions, oral mucosa pink.  Neck:  Supple, without mass or thyromegaly. Lungs:  Clear to  auscultation bilaterally. No wheezes, rales, or rhonchi. No distress.  Heart:  S1, S2 present without murmurs appreciated.  Abdomen:  +BS, soft, non-tender and non-distended. No HSM noted. No guarding or rebound. No masses appreciated.  Rectal:  Deferred  Msk:  Symmetrical without gross deformities. Normal posture. Extremities:  Trace pedal edema bilaterally  Neurologic:  Alert and  oriented x4;  grossly normal neurologically. Skin:  Intact without significant lesions or rashes. Cervical Nodes:  No significant cervical adenopathy. Psych:  Alert and cooperative. Normal mood and affect.   Lab Results  Component Value Date   ALT 38* 08/26/2012   AST 50* 08/26/2012   ALKPHOS 41 08/26/2012   BILITOT 0.6 08/26/2012   Lab Results  Component Value Date   OCCULTBLD Negative 09/01/2012   OCCULTBLD Negative 08/20/2011   OCCULTBLD Negative 08/15/2010

## 2012-10-04 NOTE — Assessment & Plan Note (Addendum)
79 year old female with worsening constipation, no rectal bleeding, and need for surveillance colonoscopy due to history of colon polyps. Last colonoscopy in 2009 by Dr. Gala Romney with a benign polyp. As of note, patient has since been seen by Dr. Oneida Alar and established care. She has no other concerning symptoms associated with constipation. Will trial Linzess 145 mcg daily and proceed with updated colonoscopy. Likely, this may be her last colonoscopy needed due to age.   Proceed with colonoscopy with Dr. Oneida Alar in the near future. The risks, benefits, and alternatives have been discussed in detail with the patient. They state understanding and desire to proceed.  Add Linzess 145 mcg daily. Probiotic daily: Restora samples provided Return in 3 months for evaluation of constipation

## 2012-10-04 NOTE — Assessment & Plan Note (Signed)
With evaluation in 2011; thought to be secondary to fatty liver. Most recent ALT 38, AST 50, improved from prior. Continue low-fat diet, weight loss efforts.

## 2012-10-04 NOTE — Progress Notes (Signed)
CC PCP 

## 2012-10-05 ENCOUNTER — Encounter (HOSPITAL_COMMUNITY): Payer: Self-pay | Admitting: Pharmacy Technician

## 2012-10-14 ENCOUNTER — Ambulatory Visit (INDEPENDENT_AMBULATORY_CARE_PROVIDER_SITE_OTHER): Payer: Medicare HMO | Admitting: Urology

## 2012-10-17 ENCOUNTER — Ambulatory Visit (HOSPITAL_COMMUNITY)
Admission: RE | Admit: 2012-10-17 | Discharge: 2012-10-17 | Disposition: A | Discharge status: Home Health | Payer: Medicare HMO | Source: Ambulatory Visit | Attending: Gastroenterology | Admitting: Gastroenterology

## 2012-10-17 ENCOUNTER — Encounter (HOSPITAL_COMMUNITY)
Admission: RE | Disposition: A | Discharge status: Home Health | Payer: Self-pay | Source: Ambulatory Visit | Attending: Gastroenterology

## 2012-10-17 ENCOUNTER — Encounter (HOSPITAL_COMMUNITY): Payer: Self-pay

## 2012-10-17 DIAGNOSIS — Z883 Allergy status to other anti-infective agents status: Secondary | ICD-10-CM | POA: Insufficient documentation

## 2012-10-17 DIAGNOSIS — Z79899 Other long term (current) drug therapy: Secondary | ICD-10-CM | POA: Insufficient documentation

## 2012-10-17 DIAGNOSIS — K5900 Constipation, unspecified: Secondary | ICD-10-CM

## 2012-10-17 DIAGNOSIS — K7689 Other specified diseases of liver: Secondary | ICD-10-CM | POA: Insufficient documentation

## 2012-10-17 DIAGNOSIS — Z885 Allergy status to narcotic agent status: Secondary | ICD-10-CM | POA: Insufficient documentation

## 2012-10-17 DIAGNOSIS — K5730 Diverticulosis of large intestine without perforation or abscess without bleeding: Secondary | ICD-10-CM | POA: Insufficient documentation

## 2012-10-17 DIAGNOSIS — Z8052 Family history of malignant neoplasm of bladder: Secondary | ICD-10-CM | POA: Insufficient documentation

## 2012-10-17 DIAGNOSIS — E7800 Pure hypercholesterolemia, unspecified: Secondary | ICD-10-CM | POA: Insufficient documentation

## 2012-10-17 DIAGNOSIS — K648 Other hemorrhoids: Secondary | ICD-10-CM | POA: Insufficient documentation

## 2012-10-17 DIAGNOSIS — Z8542 Personal history of malignant neoplasm of other parts of uterus: Secondary | ICD-10-CM | POA: Insufficient documentation

## 2012-10-17 DIAGNOSIS — I10 Essential (primary) hypertension: Secondary | ICD-10-CM | POA: Insufficient documentation

## 2012-10-17 DIAGNOSIS — Z90710 Acquired absence of both cervix and uterus: Secondary | ICD-10-CM | POA: Insufficient documentation

## 2012-10-17 DIAGNOSIS — Z1211 Encounter for screening for malignant neoplasm of colon: Secondary | ICD-10-CM | POA: Insufficient documentation

## 2012-10-17 DIAGNOSIS — Z86010 Personal history of colonic polyps: Secondary | ICD-10-CM

## 2012-10-17 DIAGNOSIS — Z880 Allergy status to penicillin: Secondary | ICD-10-CM | POA: Insufficient documentation

## 2012-10-17 HISTORY — DX: Other specified postprocedural states: Z98.890

## 2012-10-17 HISTORY — DX: Nausea with vomiting, unspecified: R11.2

## 2012-10-17 HISTORY — PX: COLONOSCOPY: SHX5424

## 2012-10-17 SURGERY — COLONOSCOPY
Anesthesia: Moderate Sedation

## 2012-10-17 MED ORDER — MIDAZOLAM HCL 5 MG/5ML IJ SOLN
INTRAMUSCULAR | Status: DC | PRN
  Administered 2012-10-17: 2 mg via INTRAVENOUS
  Administered 2012-10-17: 1 mg via INTRAVENOUS

## 2012-10-17 MED ORDER — PROMETHAZINE HCL 25 MG/ML IJ SOLN
INTRAMUSCULAR | Status: DC | PRN
  Administered 2012-10-17: 6.25 mg via INTRAVENOUS

## 2012-10-17 MED ORDER — SODIUM CHLORIDE 0.9 % IV SOLN
INTRAVENOUS | Status: DC
  Administered 2012-10-17: 08:00:00 via INTRAVENOUS

## 2012-10-17 MED ORDER — MEPERIDINE HCL 100 MG/ML IJ SOLN
INTRAMUSCULAR | Status: AC
  Filled 2012-10-17: qty 1

## 2012-10-17 MED ORDER — PROMETHAZINE HCL 25 MG/ML IJ SOLN
INTRAMUSCULAR | Status: AC
  Filled 2012-10-17: qty 1

## 2012-10-17 MED ORDER — MIDAZOLAM HCL 5 MG/5ML IJ SOLN
INTRAMUSCULAR | Status: DC
Start: 2012-10-17 — End: 2012-10-17
  Filled 2012-10-17: qty 10

## 2012-10-17 MED ORDER — SODIUM CHLORIDE 0.9 % IJ SOLN
INTRAMUSCULAR | Status: AC
  Filled 2012-10-17: qty 10

## 2012-10-17 MED ORDER — FENTANYL CITRATE 0.05 MG/ML IJ SOLN
INTRAMUSCULAR | Status: DC | PRN
  Administered 2012-10-17: 25 ug via INTRAVENOUS

## 2012-10-17 MED ORDER — FENTANYL CITRATE 0.05 MG/ML IJ SOLN
INTRAMUSCULAR | Status: AC
  Filled 2012-10-17: qty 4

## 2012-10-17 NOTE — H&P (Signed)
Primary Care Physician:  [name], MD Primary Gastroenterologist:  Dr. Oneida Alar  Pre-Procedure History & Physical: HPI:  [name] [name] is a 79 y.o. female here for [place].  Past Medical History  Diagnosis Date  . Hypertension   . Uterine cancer   . Elevated LFTs     secondary to fatty liver, negative work-up in 2011  . Hypercholesterolemia   . PONV (postoperative nausea and vomiting)     Past Surgical History  Procedure Laterality Date  . Tonsillectomy    . Cholecystectomy    . Appendectomy    . Foot surgery  2007    Pins in toes on left foot, 5 to 6 yrs ago  . Abdominal hysterectomy    . Colonoscopy    06/21/2007    UDJ:SHFWYO rectum/Sigmoid diverticula, diminutive hepatic flexure polyp s/p bx. Benign.     Prior to Admission medications   Medication Sig Start Date End Date Taking? Authorizing Provider  atorvastatin (LIPITOR) 10 MG tablet Take 1 tablet (10 mg total) by mouth daily. 06/22/12  Yes Timoteo Gaul, FNP  fish oil-omega-3 fatty acids 1000 MG capsule Take 2 g by mouth daily.     Yes Historical Provider, MD  Linaclotide Rolan Lipa) 145 MCG CAPS Take 1 capsule (145 mcg total) by mouth daily. 10/04/12  Yes Orvil Feil, NP  Multiple Vitamin (MULTIVITAMIN) capsule Take 1 capsule by mouth daily.     Yes Historical Provider, MD  peg 3350 powder (MOVIPREP) 100 G SOLR Take 1 kit (100 g total) by mouth as directed. PHARMACIST USE THE FOLLOWING FOR PATIENT DISCOUNT BIN: [id number] GROUP: [id number] ID: [id number] PATIENTS WILL SAVE UP TO $10 ON THEIR OUT-OF-POCKET EXPENSE FOR PROCESSING QUESTIONS, CALL [phone] 10/04/12  Yes Danie Binder, MD  triamterene-hydrochlorothiazide (MAXZIDE) 75-50 MG per tablet Take 1 tablet by mouth daily. 09/01/12  Yes Fayrene Helper, MD    Allergies as of 10/04/2012 - Review Complete 10/04/2012  Allergen Reaction Noted  . Ciprofloxacin hcl  06/23/2010  . Hydrocodone Nausea Only 01/19/2012  . Penicillins  09/08/2007     Family History  Problem Relation Age of Onset  . Heart disease Father   . Cancer Brother     bladder  . Hypertension Sister   . Diabetes type II Sister   . Colon cancer Neg Hx     History   Social History  . Marital Status: Married    Spouse Name: N/A    Number of Children: N/A  . Years of Education: N/A   Occupational History  . Not on file.   Social History Main Topics  . Smoking status: Never Smoker   . Smokeless tobacco: Not on file  . Alcohol Use: No  . Drug Use: No  . Sexually Active: Yes   Other Topics Concern  . Not on file   Social History Narrative  . No narrative on file    Review of Systems: See HPI, otherwise negative ROS   Physical Exam: BP 136/69  Pulse 78  Temp(Src) 97.8 F (36.6 C) (Oral)  Resp 12  Ht 5' 6"  (1.676 m)  Wt 179 lb (81.194 kg)  BMI 28.91 kg/m2  SpO2 99% General:   Alert,  pleasant and cooperative in NAD Head:  Normocephalic and atraumatic. Neck:  Supple; Lungs:  Clear throughout to auscultation.    Heart:  Regular rate and rhythm. Abdomen:  Soft, nontender and nondistended. Normal bowel sounds, without guarding, and without rebound.   Neurologic:  Alert  and  oriented x4;  grossly normal neurologically.  Impression/Plan:     SCREENING  Plan:  1. TCS TODAY

## 2012-10-17 NOTE — Op Note (Signed)
[facility] [place], [address]   COLONOSCOPY PROCEDURE REPORT  PATIENT: [name] [name], [name] [name]  MR#: [id number] BIRTHDATE: [birth year] , 72  yrs. old GENDER: Female ENDOSCOPIST: Barney Drain, MD REFERRED RZ:NBVAPOLI Moshe Cipro, M.D. PROCEDURE DATE:  10/17/2012 PROCEDURE:   Colonoscopy, screening INDICATIONS:Average risk patient for colon cancer. MEDICATIONS: Fentanyl 25 mcg IV, Versed 3 mg IV, and Promethazine (Phenergan) 6.4m IV  DESCRIPTION OF PROCEDURE:    Physical exam was performed.  Informed consent was obtained from the patient after explaining the benefits, risks, and alternatives to procedure.  The patient was connected to monitor and placed in left lateral position. Continuous oxygen was provided by nasal cannula and IV medicine administered through an indwelling cannula.  After administration of sedation and rectal exam, the patients rectum was intubated and the EC-[id number] (([id number]  colonoscope was advanced under direct visualization to the ileum.  The scope was removed slowly by carefully examining the color, texture, anatomy, and integrity mucosa on the way out.  The patient was recovered in endoscopy and discharged home in satisfactory condition.    COLON FINDINGS: The mucosa appeared normal in the terminal ileum.  , Moderate diverticulosis was noted in the sigmoid colon.  , The colon was otherwise normal.  There was no inflammation, polyps or cancers unless previously stated.  , and Moderate sized internal hemorrhoids were found.  PREP QUALITY: good. CECAL W/D TIME: 13 minutes  COMPLICATIONS: None  ENDOSCOPIC IMPRESSION: 1.   Moderate diverticulosis was noted in the sigmoid colon 2.   Moderate sized internal hemorrhoids  RECOMMENDATIONS: HIGH FIBER/LOW FAT DIET DRINK WATER LOSE 10 LBS LINZESS QD TCS IN 10-15 YEARS IF EBNEFITS OUTWEIGH THE RISKS       _______________________________ eSigned:Barney Drain MD 10/17/2012  9:44 AM

## 2012-10-18 ENCOUNTER — Encounter (HOSPITAL_COMMUNITY): Payer: Self-pay | Admitting: Gastroenterology

## 2012-12-20 ENCOUNTER — Encounter: Payer: Medicare HMO | Admitting: Family Medicine

## 2013-01-13 ENCOUNTER — Other Ambulatory Visit: Payer: Self-pay | Admitting: Family Medicine

## 2013-01-13 LAB — CBC
Hemoglobin: 12.6 g/dL (ref 12.0–15.0)
MCH: 29.4 pg (ref 26.0–34.0)
Platelets: 179 10*3/uL (ref 150–400)
RBC: 4.29 MIL/uL (ref 3.87–5.11)
WBC: 4.9 10*3/uL (ref 4.0–10.5)

## 2013-01-13 LAB — LIPID PANEL
Cholesterol: 170 mg/dL (ref 0–200)
LDL Cholesterol: 86 mg/dL (ref 0–99)
Triglycerides: 79 mg/dL (ref <150)
VLDL: 16 mg/dL (ref 0–40)

## 2013-01-13 LAB — COMPREHENSIVE METABOLIC PANEL
ALT: 39 U/L — ABNORMAL HIGH (ref 0–35)
Albumin: 4.1 g/dL (ref 3.5–5.2)
CO2: 31 mEq/L (ref 19–32)
Glucose, Bld: 94 mg/dL (ref 70–99)
Potassium: 4.5 mEq/L (ref 3.5–5.3)
Sodium: 141 mEq/L (ref 135–145)
Total Bilirubin: 0.7 mg/dL (ref 0.3–1.2)
Total Protein: 6.7 g/dL (ref 6.0–8.3)

## 2013-01-18 ENCOUNTER — Encounter: Payer: Medicare HMO | Admitting: Family Medicine

## 2013-01-20 ENCOUNTER — Encounter (INDEPENDENT_AMBULATORY_CARE_PROVIDER_SITE_OTHER): Payer: Self-pay

## 2013-01-20 ENCOUNTER — Ambulatory Visit (INDEPENDENT_AMBULATORY_CARE_PROVIDER_SITE_OTHER): Payer: Medicare HMO | Admitting: Family Medicine

## 2013-01-20 ENCOUNTER — Encounter: Payer: Self-pay | Admitting: Family Medicine

## 2013-01-20 VITALS — BP 120/78 | HR 95 | Resp 16 | Ht 65.0 in | Wt 178.8 lb

## 2013-01-20 DIAGNOSIS — R7301 Impaired fasting glucose: Secondary | ICD-10-CM

## 2013-01-20 DIAGNOSIS — E785 Hyperlipidemia, unspecified: Secondary | ICD-10-CM

## 2013-01-20 DIAGNOSIS — R7303 Prediabetes: Secondary | ICD-10-CM

## 2013-01-20 DIAGNOSIS — I10 Essential (primary) hypertension: Secondary | ICD-10-CM

## 2013-01-20 DIAGNOSIS — M5430 Sciatica, unspecified side: Secondary | ICD-10-CM

## 2013-01-20 DIAGNOSIS — Z0000 Encounter for general adult medical examination without abnormal findings: Secondary | ICD-10-CM

## 2013-01-20 DIAGNOSIS — R32 Unspecified urinary incontinence: Secondary | ICD-10-CM

## 2013-01-20 DIAGNOSIS — K5900 Constipation, unspecified: Secondary | ICD-10-CM

## 2013-01-20 DIAGNOSIS — M5441 Lumbago with sciatica, right side: Secondary | ICD-10-CM | POA: Insufficient documentation

## 2013-01-20 DIAGNOSIS — R7309 Other abnormal glucose: Secondary | ICD-10-CM

## 2013-01-20 MED ORDER — KETOROLAC TROMETHAMINE 60 MG/2ML IM SOLN
60.0000 mg | Freq: Once | INTRAMUSCULAR | Status: AC
  Administered 2013-01-20: 60 mg via INTRAMUSCULAR

## 2013-01-20 MED ORDER — PREDNISONE 5 MG PO TABS: 5.0000 mg | ORAL_TABLET | Freq: Two times a day (BID) | ORAL | Status: AC

## 2013-01-20 MED ORDER — METHYLPREDNISOLONE ACETATE 80 MG/ML IJ SUSP
80.0000 mg | Freq: Once | INTRAMUSCULAR | Status: AC
  Administered 2013-01-20: 80 mg via INTRAMUSCULAR

## 2013-01-20 NOTE — Progress Notes (Signed)
Subjective:    Patient ID: [name] [name], female    DOB: 04-Feb-1933, 79 y.o.   MRN: [id number]  HPI Preventive Screening-Counseling & Management   Patient present here today for a Medicare annual wellness visit. Also needs to review recent ;labs related to chronic problems of hyperlipidemia, prediabetes and NASH 5 day h/o acute back pain radiating to right buttock and right posterior thigh to right knee, initially was rated a 10, now down to a 5, episode is unprovoked, has known disc disease   Current Problems (verified)   Medications Prior to Visit Allergies (verified)   PAST HISTORY  Family History: Positive for HTN, dementia , prediabretes  Social History Married Mom of 2  Adult children. No current or past drug use   Risk Factors  Current exercise habits: 3 times per week, and need to increase to 6 days per week   Dietary issues discussed:rich in fresh fruit and vegetable    Cardiac risk factors: hyperlipidemia and glucose intolerance  Depression Screen  (Note: if answer to either of the following is "Yes", a more complete depression screening is indicated)   Over the past two weeks, have you felt down, depressed or hopeless? No  Over the past two weeks, have you felt little interest or pleasure in doing things? No  Have you lost interest or pleasure in daily life? No  Do you often feel hopeless? No  Do you cry easily over simple problems? No   Activities of Daily Living  In your present state of health, do you have any difficulty performing the following activities?  Driving?: No Managing money?: No Feeding yourself?:No Getting from bed to chair?:No Climbing a flight of stairs?:somnetimes Preparing food and eating?:No Bathing or showering?:No Getting dressed?:No Getting to the toilet?:No Using the toilet?:No Moving around from place to place?: at times Fall Risk Assessment In the past year have you fallen or had a near fall?:No Are you currently taking any  medications that make you dizzy?:No   Hearing Difficulties: No Do you often ask people to speak up or repeat themselves?:No Do you experience ringing or noises in your ears?:No Do you have difficulty understanding soft or whispered voices?:No  Cognitive Testing  Alert? Yes Normal Appearance?Yes  Oriented to person? Yes Place? Yes  Time? Yes  Displays appropriate judgment?Yes  Can read the correct time from a watch face? yes Are you having problems remembering things?No  Advanced Directives have been discussed with the patient?Yes , full code, living will documented and at home   List the Names of Other Physician/Practitioners you currently use: Edison Simon any recent Medical Services you may have received from other than [facility] providers in the past year (date may be approximate).   Assessment:    Annual Wellness Exam   Plan:    During the course of the visit the patient was educated and counseled about appropriate screening and preventive services including:  A healthy diet is rich in fruit, vegetables and whole grains. Poultry fish, nuts and beans are a healthy choice for protein rather then red meat. A low sodium diet and drinking 64 ounces of water daily is generally recommended. Oils and sweet should be limited. Carbohydrates especially for those who are diabetic or overweight, should be limited to 30-45 gram per meal. It is important to eat on a regular schedule, at least 3 times daily. Snacks should be primarily fruits, vegetables or nuts. It is important that you exercise regularly at least 30 minutes  5 times a week. If you develop chest pain, have severe difficulty breathing, or feel very tired, stop exercising immediately and seek medical attention  Immunization reviewed and updated. Cancer screening reviewed and updated    Patient Instructions (the written plan) was given to the patient.  Medicare Attestation  I have personally reviewed:  The patient's  medical and social history  Their use of alcohol, tobacco or illicit drugs  Their current medications and supplements  The patient's functional ability including ADLs,fall risks, home safety risks, cognitive, and hearing and visual impairment  Diet and physical activities  Evidence for depression or mood disorders  The patient's weight, height, BMI, and visual acuity have been recorded in the chart. I have made referrals, counseling, and provided education to the patient based on review of the above and I have provided the patient with a written personalized care plan for preventive services.      Review of Systems     Objective:   Physical Exam  Patient alert and oriented and in no cardiopulmonary distress.Pt in pain  HEENT: No facial asymmetry, EOMI, no sinus tenderness,  oropharynx pink and moist.  Neck adequate ROM no adenopathy.  Chest: Clear to auscultation bilaterally.  CVS: S1, S2 no murmurs, no S3.  ABD: Soft non tender.  Ext: No edema  MS: decreased ROM lumbar spine due to pain, palpable lumbar spasm  Skin: Intact, no ulcerations or rash noted.  Psych: Good eye contact, normal affect. Memory intact not anxious or depressed appearing.  CNS: CN 2-12 intact, power, tone and sensation normal throughout.       Assessment & Plan:

## 2013-01-20 NOTE — Patient Instructions (Addendum)
F/u in 4.5 month, if you need me before.  Please commit to 30 minutes of physical activity for 6 days per week  Please commit to increased intake of fresh fruit and vegetable, use beans, egg white and white meat as your protein  Toradol 1m IM and depo medrol 80 mg Im in the office today for acute sciatica to right leg  Prednisone sent in for 5 days  Take naproxen 1 twice daily for 5 days  Prilosec one daily for 6 days will be given to you to take to protect your stomach  Call back if no improvement,  You will be referred for an epidural   Fasting lipid, cmp, HBa1C in 4.5 month, before next visit please  Mammogram due Dec 24 or after , please schedule

## 2013-01-21 NOTE — Assessment & Plan Note (Signed)
Annual wellness as documented. Pt is encouraged to commit to more regular physical activity Weight is upper limit of normal for age, and with her co morbidities , she is also encouraged to work on shedding some weight, she is agreeable Fully functional and able to live independently End of life issues are doocumented, she has a living will

## 2013-01-21 NOTE — Assessment & Plan Note (Signed)
Unchanged Patient educated about the importance of limiting  Carbohydrate intake , the need to commit to daily physical activity for a minimum of 30 minutes , and to commit weight loss. The fact that changes in all these areas will reduce or eliminate all together the development of diabetes is stressed.

## 2013-01-21 NOTE — Assessment & Plan Note (Signed)
Controlled, no change in medication DASH diet and commitment to daily physical activity for a minimum of 30 minutes discussed and encouraged, as a part of hypertension management. The importance of attaining a healthy weight is also discussed.  

## 2013-01-21 NOTE — Assessment & Plan Note (Signed)
5 day h/o acute episode with no aggravating factor noted, started at a 10, now a 5 to 6 but significantly reduce her ability to ambulate. Toradol and depo medrol in office , followed by ashort course of prednisone and she will use naproxen which she has at home

## 2013-01-21 NOTE — Assessment & Plan Note (Signed)
Improved and controlled on current medication, no change in med. AST and ALT are stable on lipitor twice weekly, has  has fatty liver,continue medication as before

## 2013-01-21 NOTE — Assessment & Plan Note (Signed)
Has recently been evaluated by urology and is to start a trial of vesicare

## 2013-01-21 NOTE — Assessment & Plan Note (Signed)
Pt was prescribed linzesse, but was never able to fill, coupon expired, states her symptoms are improved, diet affects BM a lot she notes

## 2013-03-03 ENCOUNTER — Other Ambulatory Visit: Payer: Self-pay | Admitting: Family Medicine

## 2013-03-03 DIAGNOSIS — Z139 Encounter for screening, unspecified: Secondary | ICD-10-CM

## 2013-03-20 ENCOUNTER — Ambulatory Visit (HOSPITAL_COMMUNITY)
Admission: RE | Admit: 2013-03-20 | Discharge: 2013-03-20 | Disposition: A | Discharge status: Home / Self Care | Payer: Medicare HMO | Source: Ambulatory Visit | Attending: Family Medicine | Admitting: Family Medicine

## 2013-03-20 DIAGNOSIS — Z139 Encounter for screening, unspecified: Secondary | ICD-10-CM

## 2013-03-20 DIAGNOSIS — Z1231 Encounter for screening mammogram for malignant neoplasm of breast: Secondary | ICD-10-CM | POA: Insufficient documentation

## 2013-05-08 ENCOUNTER — Telehealth: Payer: Self-pay

## 2013-05-08 ENCOUNTER — Other Ambulatory Visit: Payer: Self-pay

## 2013-05-08 MED ORDER — TRIAMTERENE-HCTZ 75-50 MG PO TABS: 1.0000 | ORAL_TABLET | Freq: Every day | ORAL | Status: DC

## 2013-05-08 NOTE — Telephone Encounter (Signed)
Med refaxed to pharmacy.

## 2013-05-29 ENCOUNTER — Other Ambulatory Visit: Payer: Self-pay | Admitting: Family

## 2013-05-29 LAB — COMPREHENSIVE METABOLIC PANEL
ALT: 45 U/L — ABNORMAL HIGH (ref 0–35)
AST: 65 U/L — ABNORMAL HIGH (ref 0–37)
Albumin: 4 g/dL (ref 3.5–5.2)
Alkaline Phosphatase: 46 U/L (ref 39–117)
BUN: 19 mg/dL (ref 6–23)
CO2: 32 mEq/L (ref 19–32)
Calcium: 9.9 mg/dL (ref 8.4–10.5)
Chloride: 104 mEq/L (ref 96–112)
Creat: 0.72 mg/dL (ref 0.50–1.10)
Glucose, Bld: 106 mg/dL — ABNORMAL HIGH (ref 70–99)
Potassium: 4.2 mEq/L (ref 3.5–5.3)
Sodium: 143 mEq/L (ref 135–145)
Total Bilirubin: 0.7 mg/dL (ref 0.2–1.2)
Total Protein: 6.7 g/dL (ref 6.0–8.3)

## 2013-05-29 LAB — HEMOGLOBIN A1C
Hgb A1c MFr Bld: 6.1 % — ABNORMAL HIGH (ref <5.7)
Mean Plasma Glucose: 128 mg/dL — ABNORMAL HIGH (ref <117)

## 2013-05-29 LAB — LIPID PANEL
Cholesterol: 205 mg/dL — ABNORMAL HIGH (ref 0–200)
HDL: 81 mg/dL (ref >39)
LDL Cholesterol: 110 mg/dL — ABNORMAL HIGH (ref 0–99)
Total CHOL/HDL Ratio: 2.5 Ratio
Triglycerides: 69 mg/dL (ref <150)
VLDL: 14 mg/dL (ref 0–40)

## 2013-06-01 ENCOUNTER — Other Ambulatory Visit: Payer: Self-pay

## 2013-06-01 MED ORDER — ATORVASTATIN CALCIUM 10 MG PO TABS: 10.0000 mg | ORAL_TABLET | Freq: Every day | ORAL | Status: DC

## 2013-06-02 ENCOUNTER — Ambulatory Visit (INDEPENDENT_AMBULATORY_CARE_PROVIDER_SITE_OTHER): Payer: Medicare HMO | Admitting: Family Medicine

## 2013-06-02 ENCOUNTER — Encounter: Payer: Self-pay | Admitting: Family Medicine

## 2013-06-02 ENCOUNTER — Encounter (INDEPENDENT_AMBULATORY_CARE_PROVIDER_SITE_OTHER): Payer: Self-pay

## 2013-06-02 VITALS — BP 118/74 | HR 93 | Resp 16 | Ht 65.0 in | Wt 177.0 lb

## 2013-06-02 DIAGNOSIS — E663 Overweight: Secondary | ICD-10-CM

## 2013-06-02 DIAGNOSIS — R740 Nonspecific elevation of levels of transaminase and lactic acid dehydrogenase [LDH]: Secondary | ICD-10-CM

## 2013-06-02 DIAGNOSIS — Z79899 Other long term (current) drug therapy: Secondary | ICD-10-CM

## 2013-06-02 DIAGNOSIS — I10 Essential (primary) hypertension: Secondary | ICD-10-CM

## 2013-06-02 DIAGNOSIS — R7303 Prediabetes: Secondary | ICD-10-CM

## 2013-06-02 DIAGNOSIS — E785 Hyperlipidemia, unspecified: Secondary | ICD-10-CM

## 2013-06-02 DIAGNOSIS — R0789 Other chest pain: Secondary | ICD-10-CM

## 2013-06-02 DIAGNOSIS — R7309 Other abnormal glucose: Secondary | ICD-10-CM

## 2013-06-02 DIAGNOSIS — R7402 Elevation of levels of lactic acid dehydrogenase (LDH): Secondary | ICD-10-CM

## 2013-06-02 DIAGNOSIS — R7301 Impaired fasting glucose: Secondary | ICD-10-CM

## 2013-06-02 NOTE — Patient Instructions (Signed)
Annual exam in 4.5 month, call if you need me before  Stop atorvastatin, liver enzymes are increased  Non fasting hepatic panel last week in April  Reduce fried and fatty food intake   Fasting lipid, cmmp and HBA1C in 4.5 month, before next visit  Fall Prevention and [place] cause injuries and can affect all age groups. It is possible to prevent falls.  HOW TO PREVENT FALLS  Wear shoes with rubber soles that do not have an opening for your toes.  Keep the inside and outside of your house well lit.  Use night lights throughout your home.  Remove clutter from floors.  Clean up floor spills.  Remove throw rugs or fasten them to the floor with carpet tape.  Do not place electrical cords across pathways.  Put grab bars by your tub, shower, and toilet. Do not use towel bars as grab bars.  Put handrails on both sides of the stairway. Fix loose handrails.  Do not climb on stools or stepladders, if possible.  Do not wax your floors.  Repair uneven or unsafe sidewalks, walkways, or stairs.  Keep items you use a lot within reach.  Be aware of pets.  Keep emergency numbers next to the telephone.  Put smoke detectors in your home and near bedrooms. Ask your doctor what other things you can do to prevent falls. Document Released: 01/03/2009 Document Revised: 09/08/2011 Document Reviewed: 06/09/2011 [facility] Patient Information 2014 [place], [place].

## 2013-06-03 DIAGNOSIS — R0789 Other chest pain: Secondary | ICD-10-CM | POA: Insufficient documentation

## 2013-06-03 NOTE — Assessment & Plan Note (Signed)
unchanged Patient re-educated about  the importance of commitment to a  minimum of 150 minutes of exercise per week. The importance of healthy food choices with portion control discussed. Encouraged to start a food diary, count calories and to consider  joining a support group. Sample diet sheets offered. Goals set by the patient for the next several months.

## 2013-06-03 NOTE — Progress Notes (Signed)
   Subjective:    Patient ID: [name] [name], female    DOB: 1932/08/26, 80 y.o.   MRN: [id number]  HPI The PT is here for follow up and re-evaluation of chronic medical conditions, medication management and review of any available recent lab and radiology data.  Preventive health is updated, specifically  Cancer screening and Immunization.   . The PT denies any adverse reactions to current medications since the last visit.  Increased stress in past 6 to 8 weeks, due to poor health of a sibling with dementia who recently had a fracture and is currently staying with her 3 week h/o intermittent left chest pain non radiating , not specifically related to physical activity, less than  2 minute duration, no associated nausea or diaphoresis or light headedness    Review of Systems See HPI Denies recent fever or chills. Denies sinus pressure, nasal congestion, ear pain or sore throat. Denies chest congestion, productive cough or wheezing. Denies PND, orgtopnea, palpitations and leg swelling Denies abdominal pain, nausea, vomiting,diarrhea or constipation.   Denies dysuria, frequency, hesitancy or incontinence. Chronic back and knee pain which limits mobility, no falls Denies headaches, seizures, numbness, or tingling. Denies depression, or insomnia. Denies skin break down or rash.        Objective:   Physical Exam BP 118/74  Pulse 93  Resp 16  Ht 5' 5"  (1.651 m)  Wt 177 lb (80.287 kg)  BMI 29.45 kg/m2  SpO2 99% Patient alert and oriented and in no cardiopulmonary distress.  HEENT: No facial asymmetry, EOMI, no sinus tenderness,  oropharynx pink and moist.  Neck supple no adenopathy.  Chest: Clear to auscultation bilaterally.No reproducible chest wall pain  CVS: S1, S2 no murmurs, no S3. EKG: NSR, no ischemia ABD: Soft non tender. Bowel sounds normal.  Ext: No edema  MS: decreased  ROM spine, shoulders, hips and knees.  Skin: Intact, no ulcerations or rash noted.  Psych:  Good eye contact, normal affect. Memory intact not anxious or depressed appearing.  CNS: CN 2-12 intact, power, tone and sensation normal throughout.       Assessment & Plan:  HYPERTENSION Controlled, no change in medication DASH diet and commitment to daily physical activity for a minimum of 30 minutes discussed and encouraged, as a part of hypertension management. The importance of attaining a healthy weight is also discussed.   Atypical chest pain 3 week h/o intermittent left chest discomfort  EKG at visit: NSR , no evidence of ischemia  Prediabetes Unchanged Patient educated about the importance of limiting  Carbohydrate intake , the need to commit to daily physical activity for a minimum of 30 minutes , and to commit weight loss. The fact that changes in all these areas will reduce or eliminate all together the development of diabetes is stressed.     HYPERLIPIDEMIA Deteriorated and liver enzymes are also elevated. Hyperlipidemia:Low fat diet discussed and encouraged.  Pt to hold on statin, rept hepatic panel and focus on diet at this time  OVERWEIGHT unchanged Patient re-educated about  the importance of commitment to a  minimum of 150 minutes of exercise per week. The importance of healthy food choices with portion control discussed. Encouraged to start a food diary, count calories and to consider  joining a support group. Sample diet sheets offered. Goals set by the patient for the next several months.

## 2013-06-03 NOTE — Assessment & Plan Note (Addendum)
Deteriorated and liver enzymes are also elevated. Hyperlipidemia:Low fat diet discussed and encouraged.  Pt to hold on statin, rept hepatic panel and focus on diet at this time

## 2013-06-03 NOTE — Assessment & Plan Note (Signed)
3 week h/o intermittent left chest discomfort  EKG at visit: NSR , no evidence of ischemia

## 2013-06-03 NOTE — Assessment & Plan Note (Signed)
Controlled, no change in medication DASH diet and commitment to daily physical activity for a minimum of 30 minutes discussed and encouraged, as a part of hypertension management. The importance of attaining a healthy weight is also discussed.  

## 2013-06-03 NOTE — Assessment & Plan Note (Signed)
Unchanged Patient educated about the importance of limiting  Carbohydrate intake , the need to commit to daily physical activity for a minimum of 30 minutes , and to commit weight loss. The fact that changes in all these areas will reduce or eliminate all together the development of diabetes is stressed.

## 2013-06-06 NOTE — Progress Notes (Signed)
REVIEWED. TCS JUL 2014 TICS/IH

## 2013-07-21 ENCOUNTER — Other Ambulatory Visit: Payer: Self-pay | Admitting: Family Medicine

## 2013-07-22 LAB — HEPATIC FUNCTION PANEL
ALBUMIN: 3.8 g/dL (ref 3.5–5.2)
ALT: 46 U/L — ABNORMAL HIGH (ref 0–35)
AST: 65 U/L — ABNORMAL HIGH (ref 0–37)
Alkaline Phosphatase: 44 U/L (ref 39–117)
BILIRUBIN TOTAL: 0.6 mg/dL (ref 0.2–1.2)
Bilirubin, Direct: 0.1 mg/dL (ref 0.0–0.3)
Indirect Bilirubin: 0.5 mg/dL (ref 0.2–1.2)
TOTAL PROTEIN: 6.3 g/dL (ref 6.0–8.3)

## 2013-07-24 LAB — HEPATITIS PANEL, ACUTE
HCV Ab: NEGATIVE
Hep A IgM: NONREACTIVE
Hep B C IgM: NONREACTIVE
Hepatitis B Surface Ag: NEGATIVE

## 2013-07-25 NOTE — Addendum Note (Signed)
Addended by: Denman George B on: 07/25/2013 10:29 AM   Modules accepted: Orders

## 2013-07-31 ENCOUNTER — Telehealth: Payer: Self-pay | Admitting: Family Medicine

## 2013-08-01 NOTE — Telephone Encounter (Signed)
Noted and scanned

## 2013-08-03 ENCOUNTER — Ambulatory Visit (HOSPITAL_COMMUNITY)
Admission: RE | Admit: 2013-08-03 | Discharge: 2013-08-03 | Disposition: A | Discharge status: Home / Self Care | Payer: Medicare HMO | Source: Ambulatory Visit | Attending: Family Medicine | Admitting: Family Medicine

## 2013-08-03 DIAGNOSIS — K7689 Other specified diseases of liver: Secondary | ICD-10-CM | POA: Insufficient documentation

## 2013-08-03 DIAGNOSIS — R740 Nonspecific elevation of levels of transaminase and lactic acid dehydrogenase [LDH]: Secondary | ICD-10-CM

## 2013-08-03 DIAGNOSIS — R7402 Elevation of levels of lactic acid dehydrogenase (LDH): Secondary | ICD-10-CM

## 2013-08-08 ENCOUNTER — Encounter: Payer: Self-pay | Admitting: Gastroenterology

## 2013-08-16 NOTE — Telephone Encounter (Signed)
noted 

## 2013-08-31 ENCOUNTER — Other Ambulatory Visit: Payer: Self-pay

## 2013-08-31 MED ORDER — TRIAMTERENE-HCTZ 75-50 MG PO TABS: 1.0000 | ORAL_TABLET | Freq: Every day | ORAL | Status: DC

## 2013-09-07 ENCOUNTER — Ambulatory Visit (INDEPENDENT_AMBULATORY_CARE_PROVIDER_SITE_OTHER): Payer: Medicare HMO | Admitting: Gastroenterology

## 2013-09-07 ENCOUNTER — Encounter: Payer: Self-pay | Admitting: Gastroenterology

## 2013-09-07 VITALS — BP 144/78 | HR 71 | Temp 98.7°F | Ht 66.0 in | Wt 177.4 lb

## 2013-09-07 DIAGNOSIS — R7401 Elevation of levels of liver transaminase levels: Secondary | ICD-10-CM

## 2013-09-07 DIAGNOSIS — K5900 Constipation, unspecified: Secondary | ICD-10-CM

## 2013-09-07 DIAGNOSIS — R740 Nonspecific elevation of levels of transaminase and lactic acid dehydrogenase [LDH]: Principal | ICD-10-CM

## 2013-09-07 DIAGNOSIS — R7402 Elevation of levels of lactic acid dehydrogenase (LDH): Secondary | ICD-10-CM

## 2013-09-07 MED ORDER — DOCUSATE SODIUM 100 MG PO CAPS: 200.0000 mg | ORAL_CAPSULE | Freq: Every day | ORAL | Status: DC

## 2013-09-07 NOTE — Progress Notes (Signed)
      Primary Care Physician: [name], MD  Primary Gastroenterologist:  Barney Drain, MD   Chief Complaint  Patient presents with  . Elevated Hepatic Enzymes    HPI: [name] [name] is a 80 y.o. female here for further evaluation of elevated LFTs. We last saw her in 09/2012 for colonoscopy. She has h/o elevated AST/ALT dating back to at least 2008. In 2011, she had negative work up for autoimmune hepatitis, viral hepatitis, PBC, hemochromatosis. Recent viral markers negative again. Her numbers have been very stable. She has intermittently taken STATINs. Most recently was on Lipitor twice weekly but since has been stopped per PCP. Patient overall feels well. She has some mild constipation. BM every couple of days. Sometimes every 3 days. Once she starts having a BM, she "empties out". She uses Dulcolax if she does not have a bowel movement after 2-3 days. Denies any blood in the stool or melena. No abdominal pain. No heartburn, dysphagia, vomiting. Weight is been stable. No alcohol use..    Current Outpatient Prescriptions  Medication Sig Dispense Refill  . Multiple Vitamin (MULTIVITAMIN) capsule Take 1 capsule by mouth daily.        Marland Kitchen triamterene-hydrochlorothiazide (MAXZIDE) 75-50 MG per tablet Take 1 tablet by mouth daily.  90 tablet  3  . fish oil-omega-3 fatty acids 1000 MG capsule Take 2 g by mouth daily.         No current facility-administered medications for this visit.    Allergies as of 09/07/2013 - Review Complete 09/07/2013  Allergen Reaction Noted  . Ciprofloxacin hcl  06/23/2010  . Hydrocodone Nausea Only 01/19/2012  . Penicillins  09/08/2007    ROS:  General: Negative for anorexia, weight loss, fever, chills, fatigue, weakness. ENT: Negative for hoarseness, difficulty swallowing , nasal congestion. CV: Negative for chest pain, angina, palpitations, dyspnea on exertion, peripheral edema.  Respiratory: Negative for dyspnea at rest, dyspnea on exertion, cough,  sputum, wheezing.  GI: See history of present illness. GU:  Negative for dysuria, hematuria, urinary incontinence, urinary frequency, nocturnal urination.  Endo: Negative for unusual weight change.    Physical Examination:   BP 144/78  Pulse 71  Temp(Src) 98.7 F (37.1 C) (Oral)  Ht 5' 6"  (1.676 m)  Wt 177 lb 6.4 oz (80.468 kg)  BMI 28.65 kg/m2  General: Well-nourished, well-developed in no acute distress.  Eyes: No icterus. Mouth: Oropharyngeal mucosa moist and pink , no lesions erythema or exudate. Lungs: Clear to auscultation bilaterally.  Heart: Regular rate and rhythm, no murmurs rubs or gallops.  Abdomen: Bowel sounds are normal, nontender, nondistended, no hepatosplenomegaly or masses, no abdominal bruits or hernia , no rebound or guarding.   Extremities: 1+ lower extremity edema. No clubbing or deformities. Neuro: Alert and oriented x 4   Skin: Warm and dry, no jaundice.   Psych: Alert and cooperative, normal mood and affect.  Labs:  Lab Results  Component Value Date   ALT 46* 07/21/2013   AST 65* 07/21/2013   ALKPHOS 44 07/21/2013   BILITOT 0.6 07/21/2013    Imaging Studies: No results found.

## 2013-09-07 NOTE — Assessment & Plan Note (Signed)
History of fatty liver. Recent abdominal ultrasound noted. Her AST/ALT have been very stable over the past 7 years. Previous extensive workup in 2011. Doubt we are dealing with autoimmune hepatitis or PBC but will recheck serologies to make sure she has not converted. She plans to have her blood work along with another set of LFTs done in July when she has her labs with Dr. Moshe Cipro. Will followup on results as available make further recommendations. It would not be unreasonable to take a statin as long as her transaminases were no more than 2 times normal on therapy if it was felt appropriate by Dr. Moshe Cipro.

## 2013-09-07 NOTE — Patient Instructions (Signed)
1. Please have your labs done in 09/2013. 2. Start colace 239m daily. Call if you continue to have problems with constipation.

## 2013-09-07 NOTE — Assessment & Plan Note (Signed)
Multiple options discussed with patient. She tends to swing to diarrhea very quickly with laxatives. Trial of daily colace 253m. If not adequately improved, then trial of amitiza 836m one to two times daily. Patient did not want to try miralax (prefers pills).

## 2013-09-07 NOTE — Progress Notes (Signed)
cc'd to pcp 

## 2013-10-25 LAB — IRON AND TIBC
%SAT: 27 % (ref 20–55)
Iron: 80 ug/dL (ref 42–145)
TIBC: 291 ug/dL (ref 250–470)
UIBC: 211 ug/dL (ref 125–400)

## 2013-10-25 LAB — HEPATIC FUNCTION PANEL
ALBUMIN: 4 g/dL (ref 3.5–5.2)
ALT: 56 U/L — ABNORMAL HIGH (ref 0–35)
AST: 72 U/L — ABNORMAL HIGH (ref 0–37)
Alkaline Phosphatase: 39 U/L (ref 39–117)
BILIRUBIN DIRECT: 0.2 mg/dL (ref 0.0–0.3)
BILIRUBIN TOTAL: 0.7 mg/dL (ref 0.2–1.2)
Indirect Bilirubin: 0.5 mg/dL (ref 0.2–1.2)
Total Protein: 6.5 g/dL (ref 6.0–8.3)

## 2013-10-25 LAB — FERRITIN: FERRITIN: 423 ng/mL — AB (ref 10–291)

## 2013-10-26 LAB — COMPREHENSIVE METABOLIC PANEL
ALT: 54 U/L — ABNORMAL HIGH (ref 0–35)
AST: 72 U/L — ABNORMAL HIGH (ref 0–37)
Albumin: 4.1 g/dL (ref 3.5–5.2)
Alkaline Phosphatase: 42 U/L (ref 39–117)
BILIRUBIN TOTAL: 0.7 mg/dL (ref 0.2–1.2)
BUN: 19 mg/dL (ref 6–23)
CO2: 30 meq/L (ref 19–32)
CREATININE: 0.78 mg/dL (ref 0.50–1.10)
Calcium: 10 mg/dL (ref 8.4–10.5)
Chloride: 101 mEq/L (ref 96–112)
GLUCOSE: 103 mg/dL — AB (ref 70–99)
Potassium: 3.9 mEq/L (ref 3.5–5.3)
SODIUM: 140 meq/L (ref 135–145)
TOTAL PROTEIN: 6.8 g/dL (ref 6.0–8.3)

## 2013-10-26 LAB — IGG, IGA, IGM
IGA: 273 mg/dL (ref 69–380)
IgG (Immunoglobin G), Serum: 928 mg/dL (ref 690–1700)
IgM, Serum: 73 mg/dL (ref 52–322)

## 2013-10-26 LAB — HEMOGLOBIN A1C
HEMOGLOBIN A1C: 6 % — AB (ref <5.7)
MEAN PLASMA GLUCOSE: 126 mg/dL — AB (ref <117)

## 2013-10-26 LAB — LIPID PANEL
CHOL/HDL RATIO: 3.3 ratio
CHOLESTEROL: 250 mg/dL — AB (ref 0–200)
HDL: 76 mg/dL (ref >39)
LDL Cholesterol: 155 mg/dL — ABNORMAL HIGH (ref 0–99)
TRIGLYCERIDES: 94 mg/dL (ref <150)
VLDL: 19 mg/dL (ref 0–40)

## 2013-10-26 LAB — ANA: Anti Nuclear Antibody(ANA): NEGATIVE

## 2013-10-27 LAB — MITOCHONDRIAL/SMOOTH MUSCLE AB PNL
Mitochondrial M2 Ab, IgG: 0.11 (ref <0.91)
SMOOTH MUSCLE AB: 15 U (ref <20)

## 2013-11-02 ENCOUNTER — Other Ambulatory Visit (HOSPITAL_COMMUNITY)
Admission: RE | Admit: 2013-11-02 | Discharge: 2013-11-02 | Disposition: A | Discharge status: Home / Self Care | Payer: Medicare HMO | Source: Ambulatory Visit | Attending: Family Medicine | Admitting: Family Medicine

## 2013-11-02 ENCOUNTER — Encounter (INDEPENDENT_AMBULATORY_CARE_PROVIDER_SITE_OTHER): Payer: Self-pay

## 2013-11-02 ENCOUNTER — Encounter: Payer: Self-pay | Admitting: Family Medicine

## 2013-11-02 ENCOUNTER — Ambulatory Visit (INDEPENDENT_AMBULATORY_CARE_PROVIDER_SITE_OTHER): Payer: Medicare HMO | Admitting: Family Medicine

## 2013-11-02 VITALS — BP 128/78 | HR 91 | Resp 16 | Ht 65.0 in | Wt 178.0 lb

## 2013-11-02 DIAGNOSIS — Z124 Encounter for screening for malignant neoplasm of cervix: Secondary | ICD-10-CM | POA: Insufficient documentation

## 2013-11-02 DIAGNOSIS — E785 Hyperlipidemia, unspecified: Secondary | ICD-10-CM

## 2013-11-02 DIAGNOSIS — R7401 Elevation of levels of liver transaminase levels: Secondary | ICD-10-CM

## 2013-11-02 DIAGNOSIS — Z1211 Encounter for screening for malignant neoplasm of colon: Secondary | ICD-10-CM

## 2013-11-02 DIAGNOSIS — I10 Essential (primary) hypertension: Secondary | ICD-10-CM

## 2013-11-02 DIAGNOSIS — Z0000 Encounter for general adult medical examination without abnormal findings: Secondary | ICD-10-CM

## 2013-11-02 DIAGNOSIS — Z23 Encounter for immunization: Secondary | ICD-10-CM | POA: Insufficient documentation

## 2013-11-02 DIAGNOSIS — R740 Nonspecific elevation of levels of transaminase and lactic acid dehydrogenase [LDH]: Secondary | ICD-10-CM

## 2013-11-02 DIAGNOSIS — R7301 Impaired fasting glucose: Secondary | ICD-10-CM

## 2013-11-02 LAB — HEMOCCULT GUIAC POC 1CARD (OFFICE): Fecal Occult Blood, POC: NEGATIVE

## 2013-11-02 MED ORDER — ATORVASTATIN CALCIUM 10 MG PO TABS: 10.0000 mg | ORAL_TABLET | Freq: Every day | ORAL | Status: DC

## 2013-11-02 NOTE — Assessment & Plan Note (Signed)
Vaccine administered.

## 2013-11-02 NOTE — Assessment & Plan Note (Signed)
Annual exam as documented. Counseling done  re healthy lifestyle involving commitment to 150 minutes exercise per week, heart healthy diet, and attaining healthy weight.The importance of adequate sleep also discussed. Regular seat belt use and safe storage  of firearms if patient has them, is also discussed. Changes in health habits are decided on by the patient with goals and time frames  set for achieving them. Immunization and cancer screening needs are specifically addressed at this visit.

## 2013-11-02 NOTE — Assessment & Plan Note (Signed)
Deteriorated. Hyperlipidemia:Low fat diet discussed and encouraged.  Pt to take lipitor twice weekly  rept ast/alt in 6 weeks , then fasting labs for 4 month follow up

## 2013-11-02 NOTE — Patient Instructions (Addendum)
F/u in December call if you need me before  Resume lipitor for cholesterol 2 day per week  Non fast AST, ALT  Week of Sept 14  Fasting lipid, cmp and EGFR in 4 month and HBa1C   Prevnar today

## 2013-11-02 NOTE — Progress Notes (Signed)
   Subjective:    Patient ID: [name] [name], female    DOB: 05-30-1932, 80 y.o.   MRN: [id number]  HPI Patient is in for annual  physical exam. Review of recent lab s showed need to reduce low dose statin due to worsening hyperlipidemia  Review of Systems See HPI Progessive pain and instability of knee , holding off on surgery at this time, due to multiple illnesses in herfamily    Objective:   Physical Exam BP 128/78  Pulse 91  Resp 16  Ht 5' 5"  (1.651 m)  Wt 178 lb (80.74 kg)  BMI 29.62 kg/m2  SpO2 97% Pleasant well nourished female, alert and oriented x 3, in no cardio-pulmonary distress. Afebrile. HEENT No facial trauma or asymetry. Sinuses non tender.  EOMI, PERTL, fundoscopic exam  no hemorhage or exudate.  External ears normal, tympanic membranes clear. Oropharynx moist, no exudate, good dentition. Neck: supple, no adenopathy,JVD or thyromegaly.No bruits.  Chest: Clear to ascultation bilaterally.No crackles or wheezes. Non tender to palpation  Breast: No asymetry,no masses or lumps. No tenderness. No nipple discharge or inversion. No axillary or supraclavicular adenopathy  Cardiovascular system; Heart sounds normal,  S1 and  S2 ,no S3.  No murmur, or thrill. Apical beat not displaced Peripheral pulses normal.  Abdomen: Soft, non tender, no organomegaly or masses. No bruits. Bowel sounds normal. No guarding, tenderness or rebound.  Rectal:  Normal sphincter tone. No mass.No rectal masses.  Guaiac negative stool.  GU: External genitalia normal female genitalia , female distribution of hair. No lesions. Urethral meatus normal in size, Bladeder   Prolapse,. Bladder non tender. Vagina pink and moist , with no visible lesions ,physiologhic  discharge present . InAdequate pelvic support no  cystocele or rectocele noted Uterus absent no adnexal masses, no  adnexal tenderness.   Musculoskeletal exam: Decreased  ROM of spine, hips , shoulders and knees. No  deformity ,anmd crepitus noted. No muscle wasting or atrophy.   Neurologic: Cranial nerves 2 to 12 intact. Power, tone ,sensation and reflexes normal throughout. No disturbance in gait. No tremor.  Skin: Intact, no ulceration, erythema , scaling or rash noted. Pigmentation normal throughout  Psych; Normal mood and affect. Judgement and concentration normal        Assessment & Plan:  Encounter for annual physical exam Annual exam as documented. Counseling done  re healthy lifestyle involving commitment to 150 minutes exercise per week, heart healthy diet, and attaining healthy weight.The importance of adequate sleep also discussed. Regular seat belt use and safe storage  of firearms if patient has them, is also discussed. Changes in health habits are decided on by the patient with goals and time frames  set for achieving them. Immunization and cancer screening needs are specifically addressed at this visit.    Need for vaccination with 13-polyvalent pneumococcal conjugate vaccine Vaccine administered  HYPERLIPIDEMIA Deteriorated. Hyperlipidemia:Low fat diet discussed and encouraged.  Pt to take lipitor twice weekly  rept ast/alt in 6 weeks , then fasting labs for 4 month follow up

## 2013-11-03 LAB — CYTOLOGY - PAP

## 2013-11-15 NOTE — Progress Notes (Signed)
Quick Note:  Labs all normal except AST/ALT still up but stable. Now LDL up off Statins. Recent abd u/s as previously noted.  Lipitor restarted recently by PCP which is very reasonable given no improvement of LFTs off of statins.   I don't feel strongly about liver biopsy in this nice 81 y/o lady. I will discuss further with Dr. Oneida Alar. ______

## 2013-11-16 NOTE — Progress Notes (Signed)
Quick Note:  Called and informed pt. ______

## 2013-11-29 NOTE — Progress Notes (Addendum)
REVIEWED. REASSURING THAT RUQ U/S-ULN FOR ECHOGENICITY LIKELY DUE TO FATTY LIVER DISEASE. HOWEVER, PT HAS ELEVATED LIVER ENZYMES FELT 2o TO NASH, BUT PT has persistent AST> ALT IN SPITE OF WEIGHT LOSS.WOULD OFFER PT A LIVER BIOPSIES. SINCE REPEAT AIH SEROLOGIES NEGATIVE IN 2015.

## 2013-12-04 NOTE — Progress Notes (Signed)
REVIEWED-NO ADDITIONAL RECOMMENDATIONS. 

## 2013-12-04 NOTE — Progress Notes (Signed)
Pt said she is not interested in having a liver biopsy at this time. If she has questions or concerns she will call.

## 2013-12-04 NOTE — Progress Notes (Signed)
Please let patient know [facility] recommendations as below with regards to offering liver biopsy.

## 2013-12-08 LAB — AST: AST: 63 U/L — AB (ref 0–37)

## 2013-12-08 LAB — ALT: ALT: 47 U/L — AB (ref 0–35)

## 2013-12-08 NOTE — Progress Notes (Signed)
Offer 6 month f/u with [facility].

## 2013-12-11 NOTE — Progress Notes (Signed)
I called pt and she is agreeable to having an office visit with Dr. Oneida Alar in 6 months. Forwarding to [place] to nic.

## 2013-12-11 NOTE — Progress Notes (Signed)
PATIENT NIC'D

## 2014-03-05 ENCOUNTER — Other Ambulatory Visit: Payer: Self-pay | Admitting: Family Medicine

## 2014-03-05 DIAGNOSIS — Z1231 Encounter for screening mammogram for malignant neoplasm of breast: Secondary | ICD-10-CM

## 2014-03-08 LAB — COMPLETE METABOLIC PANEL WITH GFR
ALT: 48 U/L — AB (ref 0–35)
AST: 63 U/L — ABNORMAL HIGH (ref 0–37)
Albumin: 3.9 g/dL (ref 3.5–5.2)
Alkaline Phosphatase: 49 U/L (ref 39–117)
BILIRUBIN TOTAL: 0.6 mg/dL (ref 0.2–1.2)
BUN: 19 mg/dL (ref 6–23)
CALCIUM: 9.4 mg/dL (ref 8.4–10.5)
CHLORIDE: 103 meq/L (ref 96–112)
CO2: 30 meq/L (ref 19–32)
Creat: 0.72 mg/dL (ref 0.50–1.10)
GFR, EST NON AFRICAN AMERICAN: 80 mL/min
GLUCOSE: 92 mg/dL (ref 70–99)
Potassium: 3.7 mEq/L (ref 3.5–5.3)
Sodium: 141 mEq/L (ref 135–145)
Total Protein: 6.7 g/dL (ref 6.0–8.3)

## 2014-03-08 LAB — LIPID PANEL
Cholesterol: 178 mg/dL (ref 0–200)
HDL: 72 mg/dL (ref >39)
LDL CALC: 84 mg/dL (ref 0–99)
Total CHOL/HDL Ratio: 2.5 Ratio
Triglycerides: 111 mg/dL (ref <150)
VLDL: 22 mg/dL (ref 0–40)

## 2014-03-08 LAB — HEMOGLOBIN A1C
Hgb A1c MFr Bld: 6.3 % — ABNORMAL HIGH (ref <5.7)
MEAN PLASMA GLUCOSE: 134 mg/dL — AB (ref <117)

## 2014-03-13 ENCOUNTER — Ambulatory Visit (INDEPENDENT_AMBULATORY_CARE_PROVIDER_SITE_OTHER): Payer: Medicare HMO | Admitting: Family Medicine

## 2014-03-13 ENCOUNTER — Encounter (INDEPENDENT_AMBULATORY_CARE_PROVIDER_SITE_OTHER): Payer: Self-pay

## 2014-03-13 ENCOUNTER — Encounter: Payer: Self-pay | Admitting: Family Medicine

## 2014-03-13 VITALS — BP 120/70 | HR 97 | Resp 16 | Ht 65.0 in | Wt 174.4 lb

## 2014-03-13 DIAGNOSIS — N3946 Mixed incontinence: Secondary | ICD-10-CM

## 2014-03-13 DIAGNOSIS — R7303 Prediabetes: Secondary | ICD-10-CM

## 2014-03-13 DIAGNOSIS — E785 Hyperlipidemia, unspecified: Secondary | ICD-10-CM

## 2014-03-13 DIAGNOSIS — R7309 Other abnormal glucose: Secondary | ICD-10-CM

## 2014-03-13 DIAGNOSIS — K7581 Nonalcoholic steatohepatitis (NASH): Secondary | ICD-10-CM

## 2014-03-13 DIAGNOSIS — I10 Essential (primary) hypertension: Secondary | ICD-10-CM

## 2014-03-13 DIAGNOSIS — M1711 Unilateral primary osteoarthritis, right knee: Secondary | ICD-10-CM

## 2014-03-13 DIAGNOSIS — M129 Arthropathy, unspecified: Secondary | ICD-10-CM

## 2014-03-13 DIAGNOSIS — R7301 Impaired fasting glucose: Secondary | ICD-10-CM

## 2014-03-13 DIAGNOSIS — IMO0001 Reserved for inherently not codable concepts without codable children: Secondary | ICD-10-CM

## 2014-03-13 NOTE — Patient Instructions (Signed)
Annual wellness in 4 month, call if you need me before  Labs are good, watch sugar and carbs  Fasting lipid, cmp , and HBA1C, cBc and tSH in 4 month  Mammogram today  It is important that you exercise regularly at least 30 minutes 5 times a week. If you develop chest pain, have severe difficulty breathing, or feel very tired, stop exercising immediately and seek medical attention

## 2014-03-13 NOTE — Assessment & Plan Note (Addendum)
Improved. Pt applauded on succesful weight loss through lifestyle change, and encouraged to continue same.

## 2014-03-13 NOTE — Assessment & Plan Note (Signed)
Deteriorated Patient educated about the importance of limiting  Carbohydrate intake , the need to commit to daily physical activity for a minimum of 30 minutes , and to commit weight loss. The fact that changes in all these areas will reduce or eliminate all together the development of diabetes is stressed.

## 2014-03-13 NOTE — Assessment & Plan Note (Signed)
Worsening, needs ortho re eval , will call in the new year Fall precautions discussed

## 2014-03-13 NOTE — Assessment & Plan Note (Signed)
Controlled, no change in medication DASH diet and commitment to daily physical activity for a minimum of 30 minutes discussed and encouraged, as a part of hypertension management. The importance of attaining a healthy weight is also discussed.  

## 2014-03-17 NOTE — Assessment & Plan Note (Signed)
Slight improvement  with weight loss,pt encouraged to continue same

## 2014-03-17 NOTE — Progress Notes (Signed)
Subjective:    Patient ID: [name] [name], female    DOB: February 04, 1933, 80 y.o.   MRN: [id number]  HPI The PT is here for follow up and re-evaluation of chronic medical conditions, medication management and review of any available recent lab and radiology data.  Preventive health is updated, specifically  Cancer screening and Immunization.   Questions or concerns regarding consultations or procedures which the PT has had in the interim are  addressed. The PT denies any adverse reactions to current medications since the last visit.  Ongoing stress caring for ailing family members, esp her sibling with dementia who is worsening Inc knee pain and instability at  Times , relies increasingly on assistive device     Review of Systems See HPI Denies recent fever or chills. Denies sinus pressure, nasal congestion, ear pain or sore throat. Denies chest congestion, productive cough or wheezing. Denies chest pains, palpitations and leg swelling Denies abdominal pain, nausea, vomiting,diarrhea or constipation.   Denies dysuria, frequency, hesitancy , incontinence an ongoing issue.  Denies headaches, seizures, numbness, or tingling. Denies depression, anxiety or insomnia. Denies skin break down or rash.        Objective:   Physical Exam  BP 120/70 mmHg  Pulse 97  Resp 16  Ht 5' 5"  (1.651 m)  Wt 174 lb 6.4 oz (79.107 kg)  BMI 29.02 kg/m2  SpO2 95% Patient alert and oriented and in no cardiopulmonary distress.  HEENT: No facial asymmetry, EOMI,   oropharynx pink and moist.  Neck supple no JVD, no mass.  Chest: Clear to auscultation bilaterally.  CVS: S1, S2 no murmurs, no S3.Regular rate.  ABD: Soft non tender.   Ext: No edema  MS: decreased  ROM spine, shoulders, hips and knees.  Skin: Intact, no ulcerations or rash noted.  Psych: Good eye contact, normal affect. Memory intact not anxious or depressed appearing.  CNS: CN 2-12 intact, power,  normal throughout.no focal  deficits noted.       Assessment & Plan:  Essential hypertension Controlled, no change in medication DASH diet and commitment to daily physical activity for a minimum of 30 minutes discussed and encouraged, as a part of hypertension management. The importance of attaining a healthy weight is also discussed.   IMPAIRED FASTING GLUCOSE Deteriorated Patient educated about the importance of limiting  Carbohydrate intake , the need to commit to daily physical activity for a minimum of 30 minutes , and to commit weight loss. The fact that changes in all these areas will reduce or eliminate all together the development of diabetes is stressed.     Arthritis of right knee Worsening, needs ortho re eval , will call in the new year Fall precautions discussed  Obesity, Class II, BMI 35-39.9, with comorbidity Improved. Pt applauded on succesful weight loss through lifestyle change, and encouraged to continue same.     Hyperlipidemia LDL goal <100 Markedly improved and controlled Hyperlipidemia:Low fat diet discussed and encouraged.  Updated lab needed at/ before next visit. No med change  Urinary incontinence Uncontrolled, no real response to medication which she finds expensive, will continue as currently doing No s/s of infection  Prediabetes Deteriorated Patient educated about the importance of limiting  Carbohydrate intake , the need to commit to daily physical activity as able , and to commit weight loss. The fact that changes in all these areas will reduce or eliminate all together the development of diabetes is stressed.     NASH (nonalcoholic steatohepatitis) Slight  improvement  with weight loss,pt encouraged to continue same

## 2014-03-17 NOTE — Assessment & Plan Note (Signed)
Markedly improved and controlled Hyperlipidemia:Low fat diet discussed and encouraged.  Updated lab needed at/ before next visit. No med change

## 2014-03-17 NOTE — Assessment & Plan Note (Signed)
Uncontrolled, no real response to medication which she finds expensive, will continue as currently doing No s/s of infection

## 2014-03-17 NOTE — Assessment & Plan Note (Signed)
Deteriorated Patient educated about the importance of limiting  Carbohydrate intake , the need to commit to daily physical activity as able , and to commit weight loss. The fact that changes in all these areas will reduce or eliminate all together the development of diabetes is stressed.

## 2014-03-26 ENCOUNTER — Ambulatory Visit (HOSPITAL_COMMUNITY)
Admission: RE | Admit: 2014-03-26 | Discharge: 2014-03-26 | Disposition: A | Discharge status: Home / Self Care | Payer: Commercial Managed Care - HMO | Source: Ambulatory Visit | Attending: Family Medicine | Admitting: Family Medicine

## 2014-03-26 DIAGNOSIS — R921 Mammographic calcification found on diagnostic imaging of breast: Secondary | ICD-10-CM | POA: Insufficient documentation

## 2014-03-26 DIAGNOSIS — Z1231 Encounter for screening mammogram for malignant neoplasm of breast: Secondary | ICD-10-CM | POA: Diagnosis not present

## 2014-03-28 ENCOUNTER — Other Ambulatory Visit: Payer: Self-pay | Admitting: Family Medicine

## 2014-03-28 DIAGNOSIS — R928 Other abnormal and inconclusive findings on diagnostic imaging of breast: Secondary | ICD-10-CM

## 2014-04-05 ENCOUNTER — Ambulatory Visit
Admission: RE | Admit: 2014-04-05 | Discharge: 2014-04-05 | Disposition: A | Discharge status: Home / Self Care | Payer: Medicare HMO | Source: Ambulatory Visit | Attending: Family Medicine | Admitting: Family Medicine

## 2014-04-05 DIAGNOSIS — R928 Other abnormal and inconclusive findings on diagnostic imaging of breast: Secondary | ICD-10-CM

## 2014-04-05 DIAGNOSIS — R921 Mammographic calcification found on diagnostic imaging of breast: Secondary | ICD-10-CM | POA: Diagnosis not present

## 2014-05-08 ENCOUNTER — Telehealth: Payer: Self-pay | Admitting: Family Medicine

## 2014-05-08 DIAGNOSIS — M25512 Pain in left shoulder: Principal | ICD-10-CM

## 2014-05-08 DIAGNOSIS — M25511 Pain in right shoulder: Secondary | ICD-10-CM

## 2014-05-09 NOTE — Telephone Encounter (Signed)
States she has been having pain in both arms (area between her shoulder and elbow ) x 3 weeks, hurts to lift and raise them up. Wants referral to [name]. Please advise

## 2014-05-10 NOTE — Addendum Note (Signed)
Addended by: Eual Fines on: 05/10/2014 08:26 AM   Modules accepted: Orders

## 2014-05-10 NOTE — Telephone Encounter (Signed)
pls refer I will sign dx bilateral shoulder pain

## 2014-05-10 NOTE — Telephone Encounter (Signed)
Patient referred an aware

## 2014-05-17 ENCOUNTER — Ambulatory Visit (INDEPENDENT_AMBULATORY_CARE_PROVIDER_SITE_OTHER): Payer: Commercial Managed Care - HMO | Admitting: Orthopedic Surgery

## 2014-05-17 ENCOUNTER — Ambulatory Visit (INDEPENDENT_AMBULATORY_CARE_PROVIDER_SITE_OTHER): Payer: Commercial Managed Care - HMO

## 2014-05-17 VITALS — BP 139/76 | Ht 66.0 in | Wt 173.4 lb

## 2014-05-17 DIAGNOSIS — M25512 Pain in left shoulder: Secondary | ICD-10-CM

## 2014-05-17 NOTE — Patient Instructions (Signed)
Do home exercises provided Joint Injection  Care After  Refer to this sheet in the next few days. These instructions provide you with information on caring for yourself after you have had a joint injection. Your caregiver also may give you more specific instructions. Your treatment has been planned according to current medical practices, but problems sometimes occur. Call your caregiver if you have any problems or questions after your procedure.  After any type of joint injection, it is not uncommon to experience:  Soreness, swelling, or bruising around the injection site.  Mild numbness, tingling, or weakness around the injection site caused by the numbing medicine used before or with the injection. It also is possible to experience the following effects associated with the specific agent after injection:  Iodine-based contrast agents:  Allergic reaction (itching, hives, widespread redness, and swelling beyond the injection site).  Corticosteroids (These effects are rare.):  Allergic reaction.  Increased blood sugar levels (If you have diabetes and you notice that your blood sugar levels have increased, notify your caregiver).  Increased blood pressure levels.  Mood swings.  Hyaluronic acid in the use of viscosupplementation.  Temporary heat or redness.  Temporary rash and itching.  Increased fluid accumulation in the injected joint. These effects all should resolve within a day after your procedure.  HOME CARE INSTRUCTIONS  Limit yourself to light activity the day of your procedure. Avoid lifting heavy objects, bending, stooping, or twisting.  Take prescription or over-the-counter pain medication as directed by your caregiver.  You may apply ice to your injection site to reduce pain and swelling the day of your procedure. Ice may be applied 3-4 times:  Put ice in a plastic bag.  Place a towel between your skin and the bag.  Leave the ice on for no longer than 15-20 minutes each  time. SEEK IMMEDIATE MEDICAL CARE IF:  Pain and swelling get worse rather than better or extend beyond the injection site.  Numbness does not go away.  Blood or fluid continues to leak from the injection site.  You have chest pain.  You have swelling of your face or tongue.  You have trouble breathing or you become dizzy.  You develop a fever, chills, or severe tenderness at the injection site that last longer than 1 day. MAKE SURE YOU:  Understand these instructions.  Watch your condition.  Get help right away if you are not doing well or if you get worse. Document Released: 11/20/2010 Document Revised: 06/01/2011 Document Reviewed: 11/20/2010  [facility] Patient Information 2014 [place].

## 2014-05-17 NOTE — Progress Notes (Signed)
Patient ID: [name] [name], female   DOB: [birth year], 81 y.o.   MRN: [id number]  Chief Complaint  Patient presents with  . Arm Pain    Bilateral arm pain    HPI [name] [name] is a 81 y.o. female.  81 year old female presents with H medical onset of bilateral arm pain worse than right with no history of trauma but history of poor forward elevation pain with deceleration of the arm from a forward elevated position which is constant worse at night and associated with sleeping difficulties HPI  Past Medical History  Diagnosis Date  . Hypertension   . Elevated LFTs     secondary to fatty liver, negative work-up in 2011  . Hypercholesterolemia   . PONV (postoperative nausea and vomiting)   . Uterine cancer 08/2006    grade 1, no recurrence up to 2013  . Arthritis     spinal stenosis  . Osteoarthritis     right knee    Past Surgical History  Procedure Laterality Date  . Tonsillectomy    . Foot surgery  2007    Pins in toes on left foot, 5 to 6 yrs ago  . Colonoscopy    06/21/2007    [id number] rectum/Sigmoid diverticula, diminutive hepatic flexure polyp s/p bx. Benign.   . Colonoscopy N/A 10/17/2012    Dr. Oneida Alar- moderate diverticulosis was noted in the sigmoid colon, moderate sized internal hemorrhoids. next tcs in10 years  . Appendectomy  1973  . Cholecystectomy  1973  . Abdominal hysterectomy  2008    adenocarcinoma stage 1    Family History  Problem Relation Age of Onset  . Heart disease Father   . Cancer Brother     bladder  . Hypertension Sister   . Diabetes type II Sister   . Colon cancer Neg Hx     Social History History  Substance Use Topics  . Smoking status: Never Smoker   . Smokeless tobacco: Not on file     Comment: Never smoked  . Alcohol Use: No    Allergies  Allergen Reactions  . Ciprofloxacin Hcl     Chills, sick, could not tolerate it  . Hydrocodone Nausea Only  . Penicillins     Current Outpatient Prescriptions  Medication Sig Dispense  Refill  . aspirin 81 MG tablet Take 81 mg by mouth daily.    Marland Kitchen atorvastatin (LIPITOR) 10 MG tablet Take 1 tablet (10 mg total) by mouth daily. 30 tablet 0  . fish oil-omega-3 fatty acids 1000 MG capsule Take 2 g by mouth daily.      . Multiple Vitamin (MULTIVITAMIN) capsule Take 1 capsule by mouth daily.      Marland Kitchen triamterene-hydrochlorothiazide (MAXZIDE) 75-50 MG per tablet Take 1 tablet by mouth daily. 90 tablet 3   No current facility-administered medications for this visit.    Review of Systems Review of Systems  Musculoskeletal: Positive for arthralgias and neck pain.  Neurological: Negative for numbness.    Blood pressure 139/76, height 5' 6"  (1.676 m), weight 173 lb 6.4 oz (78.654 kg).  Physical Exam Physical Exam  Constitutional: She is oriented to person, place, and time. She appears well-developed and well-nourished.  Cardiovascular: Intact distal pulses.   Musculoskeletal:  No abnormalities in terms of walking ability  Her right shoulder action has good and normal free range of motion with no cuff weakness and no instability in the shoulder no tenderness.  The left shoulder has poor forward elevation weakness of the supraspinatus  tendon. No instability. Skin normal and palpable tenderness around the acromion  Symmetric absence of lymph node enlargement  Neurological: She is alert and oriented to person, place, and time. She displays normal reflexes. She exhibits normal muscle tone.  Skin: Skin is warm and dry.  Psychiatric: She has a normal mood and affect. Her behavior is normal.    Data Reviewed Left shoulder x-ray type I acromion slight proximal migration humeral head to suggest chronic rotator cuff disease  Assessment    Rotator cuff syndrome possible rotator cuff tear    Plan    Procedure note the subacromial injection shoulder left   Verbal consent was obtained to inject the  Left   Shoulder  Timeout was completed to confirm the injection site is a  subacromial space of the  left  shoulder  Medication used Depo-Medrol 40 mg and lidocaine 1% 3 cc  Anesthesia was provided by ethyl chloride  The injection was performed in the left  posterior subacromial space. After pinning the skin with alcohol and anesthetized the skin with ethyl chloride the subacromial space was injected using a 20-gauge needle. There were no complications  Sterile dressing was applied.                 Arther Abbott 05/17/2014, 2:48 PM

## 2014-05-21 ENCOUNTER — Ambulatory Visit: Payer: Medicare HMO | Admitting: Orthopedic Surgery

## 2014-05-22 ENCOUNTER — Encounter: Payer: Self-pay | Admitting: Gastroenterology

## 2014-06-28 ENCOUNTER — Ambulatory Visit: Payer: Self-pay | Admitting: Orthopedic Surgery

## 2014-07-11 DIAGNOSIS — I10 Essential (primary) hypertension: Secondary | ICD-10-CM | POA: Diagnosis not present

## 2014-07-11 DIAGNOSIS — R7309 Other abnormal glucose: Secondary | ICD-10-CM | POA: Diagnosis not present

## 2014-07-11 DIAGNOSIS — E785 Hyperlipidemia, unspecified: Secondary | ICD-10-CM | POA: Diagnosis not present

## 2014-07-12 LAB — COMPREHENSIVE METABOLIC PANEL
ALK PHOS: 45 U/L (ref 39–117)
ALT: 38 U/L — AB (ref 0–35)
AST: 58 U/L — AB (ref 0–37)
Albumin: 3.9 g/dL (ref 3.5–5.2)
BUN: 26 mg/dL — ABNORMAL HIGH (ref 6–23)
CO2: 30 meq/L (ref 19–32)
Calcium: 9.6 mg/dL (ref 8.4–10.5)
Chloride: 102 mEq/L (ref 96–112)
Creat: 0.87 mg/dL (ref 0.50–1.10)
Glucose, Bld: 94 mg/dL (ref 70–99)
POTASSIUM: 3.6 meq/L (ref 3.5–5.3)
Sodium: 142 mEq/L (ref 135–145)
Total Bilirubin: 0.7 mg/dL (ref 0.2–1.2)
Total Protein: 6.7 g/dL (ref 6.0–8.3)

## 2014-07-12 LAB — CBC WITH DIFFERENTIAL/PLATELET
BASOS PCT: 0 % (ref 0–1)
Basophils Absolute: 0 10*3/uL (ref 0.0–0.1)
EOS ABS: 0 10*3/uL (ref 0.0–0.7)
Eosinophils Relative: 1 % (ref 0–5)
HCT: 38.7 % (ref 36.0–46.0)
HEMOGLOBIN: 12.9 g/dL (ref 12.0–15.0)
Lymphocytes Relative: 31 % (ref 12–46)
Lymphs Abs: 1.5 10*3/uL (ref 0.7–4.0)
MCH: 28.9 pg (ref 26.0–34.0)
MCHC: 33.3 g/dL (ref 30.0–36.0)
MCV: 86.8 fL (ref 78.0–100.0)
MPV: 10.7 fL (ref 9.4–12.4)
Monocytes Absolute: 0.3 10*3/uL (ref 0.1–1.0)
Monocytes Relative: 7 % (ref 3–12)
NEUTROS PCT: 61 % (ref 43–77)
Neutro Abs: 3 10*3/uL (ref 1.7–7.7)
PLATELETS: 181 10*3/uL (ref 150–400)
RBC: 4.46 MIL/uL (ref 3.87–5.11)
RDW: 13.8 % (ref 11.5–15.5)
WBC: 4.9 10*3/uL (ref 4.0–10.5)

## 2014-07-12 LAB — TSH: TSH: 3.251 u[IU]/mL (ref 0.350–4.500)

## 2014-07-12 LAB — LIPID PANEL
CHOL/HDL RATIO: 2.5 ratio
CHOLESTEROL: 206 mg/dL — AB (ref 0–200)
HDL: 84 mg/dL (ref >=46)
LDL Cholesterol: 103 mg/dL — ABNORMAL HIGH (ref 0–99)
Triglycerides: 94 mg/dL (ref <150)
VLDL: 19 mg/dL (ref 0–40)

## 2014-07-12 LAB — HEMOGLOBIN A1C
Hgb A1c MFr Bld: 5.9 % — ABNORMAL HIGH (ref <5.7)
Mean Plasma Glucose: 123 mg/dL — ABNORMAL HIGH (ref <117)

## 2014-07-18 ENCOUNTER — Encounter: Payer: Self-pay | Admitting: Family Medicine

## 2014-07-18 ENCOUNTER — Ambulatory Visit (INDEPENDENT_AMBULATORY_CARE_PROVIDER_SITE_OTHER): Payer: Commercial Managed Care - HMO | Admitting: Family Medicine

## 2014-07-18 VITALS — BP 122/68 | HR 90 | Resp 18 | Ht 66.0 in | Wt 176.1 lb

## 2014-07-18 DIAGNOSIS — Z0000 Encounter for general adult medical examination without abnormal findings: Secondary | ICD-10-CM | POA: Diagnosis not present

## 2014-07-18 NOTE — Patient Instructions (Signed)
Annual physical exam in September, call if you need me before  Continued good health, be careful not to fall  Call if leg swelling persists , I will send a limited supply of medicine for this  Fall Prevention and Home Safety Falls cause injuries and can affect all age groups. It is possible to prevent falls.  HOW TO PREVENT FALLS  Wear shoes with rubber soles that do not have an opening for your toes.  Keep the inside and outside of your house well lit.  Use night lights throughout your home.  Remove clutter from floors.  Clean up floor spills.  Remove throw rugs or fasten them to the floor with carpet tape.  Do not place electrical cords across pathways.  Put grab bars by your tub, shower, and toilet. Do not use towel bars as grab bars.  Put handrails on both sides of the stairway. Fix loose handrails.  Do not climb on stools or stepladders, if possible.  Do not wax your floors.  Repair uneven or unsafe sidewalks, walkways, or stairs.  Keep items you use a lot within reach.  Be aware of pets.  Keep emergency numbers next to the telephone.  Put smoke detectors in your home and near bedrooms. Ask your doctor what other things you can do to prevent falls. Document Released: 01/03/2009 Document Revised: 09/08/2011 Document Reviewed: 06/09/2011 [facility] Patient Information 2015 [place], [place]. This information is not intended to replace advice given to you by your health care provider. Make sure you discuss any questions you have with your health care provider.

## 2014-07-18 NOTE — Progress Notes (Signed)
Subjective:    Patient ID: [name] [name], female    DOB: [birth year], 81 y.o.   MRN: [id number]  HPI Preventive Screening-Counseling & Management   Patient present here today for a Medicare annual wellness visit.   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 No  Over the past two weeks, have you felt little interest or pleasure in doing things? No  Have you lost interest or pleasure in daily life? No  Do you often feel hopeless? No  Do you cry easily over simple problems? No   Activities of Daily Living  In your present state of health, do you have any difficulty performing the following activities?  Driving?: No Managing money?: No Feeding yourself?:No Getting from bed to chair?:No Climbing a flight of stairs?: Yes due to knee pain Preparing food and eating?:No Bathing or showering?:No Getting dressed?:yes , rotator cuffs are diseased, left more than right Getting to the toilet?:No Using the toilet?:No Moving around from place to place?: Yes due to knee pain and instability  Fall Risk Assessment In the past year have you fallen or had a near fall?:No Are you currently taking any medications that make you dizzy?:No   Hearing Difficulties: No Do you often ask people to speak up or repeat themselves?:No Do you experience ringing or noises in your ears?:No Do you have difficulty understanding soft or whispered voices?:No  Cognitive  Testing  Alert? Yes Normal Appearance?Yes  Oriented to person? Yes Place? Yes  Time? Yes  Displays appropriate judgment?Yes  Can read the correct time from a watch face? yes Are you having problems remembering things?No  Advanced Directives have been discussed with the patient?Yes and patient states that she has both living will and healthcare poa , full code has a living will    List the Names of Other Physician/Practitioners you currently use: care teams updated    Indicate any recent Medical Services you may have received from other than [facility] providers in the past year (date may be approximate).   Assessment:    Annual Wellness Exam   Plan:     Medicare Attestation  I have personally reviewed:  The patient's medical and social history  Their use of alcohol, tobacco or illicit drugs  Their current medications and supplements  The patient's functional ability including ADLs,fall risks, home safety risks, cognitive, and hearing and visual impairment  Diet and physical activities  Evidence for depression or mood disorders  The patient's weight, height, BMI, and visual acuity have been recorded in the chart. I have made referrals, counseling, and provided education to the patient based on review of the above and I have provided the patient with a written personalized care plan for preventive services.      Review of Systems     Objective:   Physical Exam BP 122/68 mmHg  Pulse 90  Resp 18  Ht 5' 6"  (1.676 m)  Wt 176 lb 1.3 oz (79.869 kg)  BMI 28.43 kg/m2  SpO2 99%        Assessment & Plan:  Medicare annual wellness visit, subsequent Annual exam as documented. Counseling done  re healthy lifestyle involving commitment to 150 minutes exercise per week, heart healthy diet, and attaining healthy weight.The importance of adequate sleep also discussed. Regular seat belt use and home safety, is also discussed. Changes in health habits are decided on by the patient with  goals and time frames  set for achieving them. Immunization and cancer screening needs are specifically addressed at this visit.

## 2014-07-22 NOTE — Assessment & Plan Note (Signed)

## 2014-09-17 ENCOUNTER — Telehealth: Payer: Self-pay | Admitting: Family Medicine

## 2014-09-17 MED ORDER — FUROSEMIDE 20 MG PO TABS: 20.0000 mg | ORAL_TABLET | Freq: Every day | ORAL | Status: DC

## 2014-09-17 MED ORDER — POTASSIUM CHLORIDE CRYS ER 20 MEQ PO TBCR: 20.0000 meq | EXTENDED_RELEASE_TABLET | Freq: Every day | ORAL | Status: DC

## 2014-09-17 NOTE — Telephone Encounter (Signed)
Pl ssend and let her knwo lasix 20 mg one daily as needed #30 no refuill and potassium 20 meq one daily as needed #30 no refill, take both on same day when she needs

## 2014-09-17 NOTE — Addendum Note (Signed)
Addended by: Eual Fines on: 09/17/2014 10:16 AM   Modules accepted: Orders

## 2014-09-17 NOTE — Telephone Encounter (Signed)
Husband aware and will let her know

## 2014-09-27 ENCOUNTER — Ambulatory Visit (INDEPENDENT_AMBULATORY_CARE_PROVIDER_SITE_OTHER): Payer: Commercial Managed Care - HMO | Admitting: Gastroenterology

## 2014-09-27 ENCOUNTER — Encounter: Payer: Self-pay | Admitting: Gastroenterology

## 2014-09-27 VITALS — BP 123/75 | HR 90 | Temp 97.3°F | Ht 66.0 in | Wt 169.8 lb

## 2014-09-27 DIAGNOSIS — K5904 Chronic idiopathic constipation: Secondary | ICD-10-CM

## 2014-09-27 DIAGNOSIS — K5909 Other constipation: Secondary | ICD-10-CM

## 2014-09-27 DIAGNOSIS — K7581 Nonalcoholic steatohepatitis (NASH): Secondary | ICD-10-CM | POA: Diagnosis not present

## 2014-09-27 NOTE — Patient Instructions (Signed)
DRINK WATER TO KEEP YOUR URINE LIGHT YELLOW.  FOLLOW A HIGH FIBER DIET. AVOID ITEMS THAT CAUSE BLOATING & GAS. SEE INFO BELOW.  USE FIBER POWDER 1 PACKET,POWDER, OR GUMMIES ONCE DAILY FOR 7 DAYS THEN TWICE DAILY. AVOID SUPPLEMENTS THAT CAUSE BLOATING & GAS.  ADD IBGARD 1 PILL WITH 8 OZ WATER TWICE DAILY. TAKE 30 MINS BEFORE BREAKFAST AND SUPPER.   FOLLOW UP IN 4 MOS.  I WILL CONSIDER ADDING AMITIZA OR LINZESS IF YOUR CONSTIPATION NOT IDEALLY CONTROLLED.   Diverticulosis Diverticulosis is a common condition that develops when small pouches (diverticula) form in the wall of the colon. The risk of diverticulosis increases with age. It happens more often in people who eat a low-fiber diet. Most individuals with diverticulosis have no symptoms. Those individuals with symptoms usually experience belly (abdominal) pain, constipation, or loose stools (diarrhea).  HOME CARE INSTRUCTIONS  Increase the amount of fiber in your diet as directed by your caregiver or dietician. This may reduce symptoms of diverticulosis.   Drink at least 6 to 8 glasses of water each day to prevent constipation.   Try not to strain when you have a bowel movement.   Avoiding nuts and seeds to prevent complications is still an uncertain benefit.   FOODS HAVING HIGH FIBER CONTENT INCLUDE:  Fruits. Apple, peach, pear, tangerine, raisins, prunes.   Vegetables. Brussels sprouts, asparagus, broccoli, cabbage, carrot, cauliflower, romaine lettuce, spinach, summer squash, tomato, winter squash, zucchini.   Starchy Vegetables. Baked beans, kidney beans, lima beans, split peas, lentils, potatoes (with skin).   Grains. Whole wheat bread, brown rice, bran flake cereal, plain oatmeal, white rice, shredded wheat, bran muffins.

## 2014-09-27 NOTE — Assessment & Plan Note (Signed)
LIKELY DUE TO AGE AND DIVERTICULOSIS.  DRINK WATER EAT FIBER. ADD FIBER SUPPLEMENTS. AVOID ITEMS THAT CAUSE BLOATING & GAS. ADD IBGARD 1 PO BID WITH 8 8 OZ WATER. FOLLOW UP IN 4 MOS.  CONSIDER AMITIZA OR LINZESS IF CONSTIPATION NOT IDEALLY CONTROLLED.

## 2014-09-27 NOTE — Progress Notes (Signed)
   Subjective:    Patient ID: [name] [name], female    DOB: [birth year], 81 y.o.   MRN: [id number] [name] Nakayama, MD  HPI BMs: in 3-4 days. Yesterday could hardly get there([place]).  DIDN'T TRY LINZESS. TRIED LAXATIVE AND PROBIOTIC. PROBIOTICS DIDN'T MAKE A DIFFERENCE. HASN'T TRIED FIBER SUPPLEMENTS. BLOATED/GASSY: EVERY 2-3 DAYS AND GETS BETTER AFTER BM. RARE [place] STOOLS.  PT DENIES FEVER, CHILLS, HEMATOCHEZIA, nausea, vomiting, melena,  CHEST PAIN, SHORTNESS OF BREATH,  abdominal pain, problems swallowing, OR  heartburn or indigestion.   Past Medical History  Diagnosis Date  . Hypertension   . Elevated LFTs     secondary to fatty liver, negative work-up in 2011  . Hypercholesterolemia   . PONV (postoperative nausea and vomiting)   . Uterine cancer 08/2006    grade 1, no recurrence up to 2013  . Arthritis     spinal stenosis  . Osteoarthritis     right knee   Past Surgical History  Procedure Laterality Date  . Tonsillectomy    . Foot surgery  2007    Pins in toes on left foot, 5 to 6 yrs ago  . Colonoscopy    06/21/2007    IPJ:ASNKNL rectum/Sigmoid diverticula, diminutive hepatic flexure polyp s/p bx. Benign.   . Colonoscopy N/A 10/17/2012    Dr. Oneida Alar- moderate diverticulosis was noted in the sigmoid colon, moderate sized internal hemorrhoids. next tcs in10 years  . Appendectomy  1973  . Cholecystectomy  1973  . Abdominal hysterectomy  2008    adenocarcinoma stage 1   Allergies  Allergen Reactions  . Ciprofloxacin Hcl     Chills, sick, could not tolerate it  . Hydrocodone Nausea Only  . Penicillins     Current Outpatient Prescriptions  Medication Sig Dispense Refill  . aspirin 81 MG tablet Take 81 mg by mouth daily.    Marland Kitchen atorvastatin (LIPITOR) 10 MG tablet Take 1 tablet (10 mg total) by mouth daily.    . furosemide (LASIX) 20 MG tablet Take 1 tablet (20 mg total) by mouth daily.    . Multiple Vitamin (MULTIVITAMIN) capsule Take 1 capsule by mouth daily.      .  potassium chloride SA (KLOR-CON M20) 20 MEQ tablet Take 1 tablet (20 mEq total) by mouth daily. 30 tablet 0  . triamterene-hydrochlorothiazide (MAXZIDE) 75-50 MG per tablet Take 1 tablet by mouth daily. 90 tablet 3  . fish oil-omega-3 fatty acids 1000 MG capsule Take 2 g by mouth daily.        Review of Systems     Objective:   Physical Exam        Assessment & Plan:

## 2014-09-27 NOTE — Progress Notes (Signed)
CC'ED TO PCP 

## 2014-09-27 NOTE — Assessment & Plan Note (Signed)
WEIGHT DOWN 8 LBS SINCE JUN 2015. LAST HFP ELEVATED AST/ALT.  CONTINUE TO MONITOR SYMPTOMS. HFP 2X/YEAR FOLLOW UP IN 4-6 MOS.

## 2014-09-28 ENCOUNTER — Other Ambulatory Visit: Payer: Self-pay | Admitting: Family Medicine

## 2014-09-28 DIAGNOSIS — R928 Other abnormal and inconclusive findings on diagnostic imaging of breast: Secondary | ICD-10-CM

## 2014-10-16 ENCOUNTER — Ambulatory Visit
Admission: RE | Admit: 2014-10-16 | Discharge: 2014-10-16 | Disposition: A | Discharge status: Home / Self Care | Payer: Commercial Managed Care - HMO | Source: Ambulatory Visit | Attending: Family Medicine | Admitting: Family Medicine

## 2014-10-16 DIAGNOSIS — R921 Mammographic calcification found on diagnostic imaging of breast: Secondary | ICD-10-CM | POA: Diagnosis not present

## 2014-10-16 DIAGNOSIS — R928 Other abnormal and inconclusive findings on diagnostic imaging of breast: Secondary | ICD-10-CM

## 2014-10-29 ENCOUNTER — Other Ambulatory Visit: Payer: Self-pay | Admitting: Family Medicine

## 2014-11-21 ENCOUNTER — Other Ambulatory Visit: Payer: Self-pay

## 2014-11-21 DIAGNOSIS — E785 Hyperlipidemia, unspecified: Secondary | ICD-10-CM

## 2014-11-21 MED ORDER — ATORVASTATIN CALCIUM 10 MG PO TABS: 10.0000 mg | ORAL_TABLET | Freq: Every day | ORAL | Status: DC

## 2014-11-29 ENCOUNTER — Encounter: Payer: Self-pay | Admitting: Family Medicine

## 2014-11-29 ENCOUNTER — Other Ambulatory Visit (HOSPITAL_COMMUNITY)
Admission: RE | Admit: 2014-11-29 | Discharge: 2014-11-29 | Disposition: A | Discharge status: Home / Self Care | Payer: Commercial Managed Care - HMO | Source: Ambulatory Visit | Attending: Family Medicine | Admitting: Family Medicine

## 2014-11-29 ENCOUNTER — Ambulatory Visit (INDEPENDENT_AMBULATORY_CARE_PROVIDER_SITE_OTHER): Payer: Commercial Managed Care - HMO | Admitting: Family Medicine

## 2014-11-29 VITALS — BP 118/78 | HR 85 | Resp 16 | Ht 66.0 in | Wt 166.1 lb

## 2014-11-29 DIAGNOSIS — Z124 Encounter for screening for malignant neoplasm of cervix: Secondary | ICD-10-CM | POA: Diagnosis not present

## 2014-11-29 DIAGNOSIS — R7303 Prediabetes: Secondary | ICD-10-CM

## 2014-11-29 DIAGNOSIS — I10 Essential (primary) hypertension: Secondary | ICD-10-CM

## 2014-11-29 DIAGNOSIS — Z1211 Encounter for screening for malignant neoplasm of colon: Secondary | ICD-10-CM

## 2014-11-29 DIAGNOSIS — M79672 Pain in left foot: Secondary | ICD-10-CM

## 2014-11-29 DIAGNOSIS — R7309 Other abnormal glucose: Secondary | ICD-10-CM

## 2014-11-29 DIAGNOSIS — Z0000 Encounter for general adult medical examination without abnormal findings: Secondary | ICD-10-CM

## 2014-11-29 DIAGNOSIS — E785 Hyperlipidemia, unspecified: Secondary | ICD-10-CM

## 2014-11-29 DIAGNOSIS — M5432 Sciatica, left side: Secondary | ICD-10-CM

## 2014-11-29 DIAGNOSIS — M5387 Other specified dorsopathies, lumbosacral region: Secondary | ICD-10-CM | POA: Insufficient documentation

## 2014-11-29 LAB — POC HEMOCCULT BLD/STL (OFFICE/1-CARD/DIAGNOSTIC): FECAL OCCULT BLD: NEGATIVE

## 2014-11-29 MED ORDER — ATORVASTATIN CALCIUM 10 MG PO TABS: 10.0000 mg | ORAL_TABLET | Freq: Every day | ORAL | Status: DC

## 2014-11-29 MED ORDER — KETOROLAC TROMETHAMINE 60 MG/2ML IM SOLN
60.0000 mg | Freq: Once | INTRAMUSCULAR | Status: AC
  Administered 2014-11-29: 60 mg via INTRAMUSCULAR

## 2014-11-29 MED ORDER — TRIAMTERENE-HCTZ 75-50 MG PO TABS: 1.0000 | ORAL_TABLET | Freq: Every day | ORAL | Status: DC

## 2014-11-29 MED ORDER — METHYLPREDNISOLONE ACETATE 80 MG/ML IJ SUSP
80.0000 mg | Freq: Once | INTRAMUSCULAR | Status: AC
  Administered 2014-11-29: 80 mg via INTRAMUSCULAR

## 2014-11-29 MED ORDER — PREDNISONE 5 MG PO TABS: 5.0000 mg | ORAL_TABLET | Freq: Two times a day (BID) | ORAL | Status: AC

## 2014-11-29 NOTE — Assessment & Plan Note (Addendum)
Left foot pain and ulcer between great and 2nd toe, has had left great toe surgery years ago, andd pin was subsequently removed, refer to podiatry

## 2014-11-29 NOTE — Progress Notes (Signed)
   Subjective:    Patient ID: [name] [name], female    DOB: 16-Oct-1932, 81 y.o.   MRN: [id number]  HPI Patient is in for annual physical exam.  Recent labs, if available are reviewed. Immunization is reviewed , and  updated if needed.choses for financial reasons to get her flu vaccine at pharmacy Increased back and lower extremity pain x 1 week, wants help C/o left foot pain and swelling, wears pad between 4th and 5th toes, has had surgery in past, needs podiatry eval  Review of Systems See HPI     Objective:   Physical Exam  BP 118/78 mmHg  Pulse 85  Resp 16  Ht 5' 6"  (1.676 m)  Wt 166 lb 1.9 oz (75.352 kg)  BMI 26.83 kg/m2  SpO2 100% Pleasant well nourished female, alert and oriented x 3, in no cardio-pulmonary distress. Afebrile. HEENT No facial trauma or asymetry. Sinuses non tender.  Extra occullar muscles intact, pupils equally reactive to light. External ears normal, tympanic membranes clear. Oropharynx moist, no exudate, good dentition. Neck: supple, no adenopathy,JVD or thyromegaly.No bruits.  Chest: Clear to ascultation bilaterally.No crackles or wheezes. Non tender to palpation  Breast: No asymetry,no masses or lumps. No tenderness. No nipple discharge or inversion. No axillary or supraclavicular adenopathy  Cardiovascular system; Heart sounds normal,  S1 and  S2 ,no S3.  No murmur, or thrill. Apical beat not displaced Peripheral pulses normal.  Abdomen: Soft, non tender, no organomegaly or masses. No bruits. Bowel sounds normal. No guarding, tenderness or rebound.  Rectal:  Normal sphincter tone. No mass.No rectal masses.  Guaiac negative stool.  GU: External genitalia normal female genitalia , female distribution of hair. No lesions. Urethral meatus normal in size, no  Prolapse, no lesions visibly  Present. Bladder non tender. Vagina pink and moist , with no visible lesions , physiologic discharge present . InAdequate pelvic support    Cystocele present , no rectocele noted  Uterus absent, no adnexal mass palpable,no adnexal tenderness   Musculoskeletal exam: Full ROM of spine, hips , shoulders and knees. No deformity ,swelling or crepitus noted. No muscle wasting or atrophy.  Swelling noted on dorsum of left foot, distally, near bases of 4th and 5th toes Neurologic: Cranial nerves 2 to 12 intact. Power, tone ,sensation and reflexes normal throughout. No disturbance in gait. No tremor.  Skin: Scaling noted between left 4th and 5th toes, no drainage Pigmentation normal throughout  Psych; Normal mood and affect. 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 Counseling done  re healthy lifestyle involving commitment to 150 minutes exercise per week, heart healthy diet, and attaining healthy weight.The importance of adequate sleep also discussed. Regular seat belt use and home safety, is also discussed. Changes in health habits are decided on by the patient with goals and time frames  set for achieving them. Immunization and cancer screening needs are specifically addressed at this visit.

## 2014-11-29 NOTE — Assessment & Plan Note (Signed)
2 week flare Uncontrolled.Toradol and depo medrol administered IM in the office , to be followed by a short course of oral prednisone

## 2014-11-29 NOTE — Patient Instructions (Addendum)
F/u early Jan, call if you need me before  Injections today in office for sciatica, and 5 days of prednisone sent to [facility] get flu vac at [facility] [place] are referred to podiatrist re left foot  Focus on adequately nourishing your body so that your weight is stable, you have lost 10 pounds in last 4 months Call in 6 to 8 weeks if you continue to lose weight , You may need trial of medication or GI referral  Fasting lipid, cmp and EGFR and hBA1C in January  Thanks for choosing [facility], we consider it a privelige to serve you.

## 2014-12-02 NOTE — Assessment & Plan Note (Signed)

## 2014-12-03 ENCOUNTER — Telehealth: Payer: Self-pay | Admitting: *Deleted

## 2014-12-03 LAB — CYTOLOGY - PAP

## 2014-12-03 NOTE — Telephone Encounter (Signed)
Pt has a appt with Dr. Barkley Bruns 12/10/14 at 2:30

## 2014-12-10 DIAGNOSIS — D2372 Other benign neoplasm of skin of left lower limb, including hip: Secondary | ICD-10-CM | POA: Diagnosis not present

## 2014-12-10 DIAGNOSIS — M79609 Pain in unspecified limb: Secondary | ICD-10-CM | POA: Diagnosis not present

## 2015-01-01 ENCOUNTER — Encounter: Payer: Self-pay | Admitting: Gastroenterology

## 2015-03-20 DIAGNOSIS — E785 Hyperlipidemia, unspecified: Secondary | ICD-10-CM | POA: Diagnosis not present

## 2015-03-20 DIAGNOSIS — R7309 Other abnormal glucose: Secondary | ICD-10-CM | POA: Diagnosis not present

## 2015-03-21 LAB — COMPLETE METABOLIC PANEL WITH GFR
ALBUMIN: 4.1 g/dL (ref 3.6–5.1)
ALT: 47 U/L — AB (ref 6–29)
AST: 68 U/L — ABNORMAL HIGH (ref 10–35)
Alkaline Phosphatase: 43 U/L (ref 33–130)
BILIRUBIN TOTAL: 0.7 mg/dL (ref 0.2–1.2)
BUN: 21 mg/dL (ref 7–25)
CO2: 29 mmol/L (ref 20–31)
CREATININE: 0.66 mg/dL (ref 0.60–0.88)
Calcium: 9.6 mg/dL (ref 8.6–10.4)
Chloride: 101 mmol/L (ref 98–110)
GFR, Est African American: >89 mL/min (ref >=60)
GFR, Est Non African American: 84 mL/min (ref >=60)
GLUCOSE: 91 mg/dL (ref 65–99)
Potassium: 3.8 mmol/L (ref 3.5–5.3)
SODIUM: 140 mmol/L (ref 135–146)
TOTAL PROTEIN: 6.8 g/dL (ref 6.1–8.1)

## 2015-03-21 LAB — HEMOGLOBIN A1C
Hgb A1c MFr Bld: 6 % — ABNORMAL HIGH (ref <5.7)
Mean Plasma Glucose: 126 mg/dL — ABNORMAL HIGH (ref <117)

## 2015-03-21 LAB — LIPID PANEL
Cholesterol: 209 mg/dL — ABNORMAL HIGH (ref 125–200)
HDL: 105 mg/dL (ref >=46)
LDL CALC: 89 mg/dL (ref <130)
Total CHOL/HDL Ratio: 2 Ratio (ref <=5.0)
Triglycerides: 77 mg/dL (ref <150)
VLDL: 15 mg/dL (ref <30)

## 2015-03-26 ENCOUNTER — Ambulatory Visit (INDEPENDENT_AMBULATORY_CARE_PROVIDER_SITE_OTHER): Payer: Commercial Managed Care - HMO | Admitting: Family Medicine

## 2015-03-26 ENCOUNTER — Encounter: Payer: Self-pay | Admitting: Family Medicine

## 2015-03-26 VITALS — BP 120/74 | HR 64 | Resp 16 | Ht 66.0 in | Wt 164.0 lb

## 2015-03-26 DIAGNOSIS — I10 Essential (primary) hypertension: Secondary | ICD-10-CM | POA: Diagnosis not present

## 2015-03-26 DIAGNOSIS — N39498 Other specified urinary incontinence: Secondary | ICD-10-CM

## 2015-03-26 DIAGNOSIS — R7303 Prediabetes: Secondary | ICD-10-CM | POA: Diagnosis not present

## 2015-03-26 DIAGNOSIS — E559 Vitamin D deficiency, unspecified: Secondary | ICD-10-CM

## 2015-03-26 DIAGNOSIS — E785 Hyperlipidemia, unspecified: Secondary | ICD-10-CM

## 2015-03-26 DIAGNOSIS — Z13820 Encounter for screening for osteoporosis: Secondary | ICD-10-CM

## 2015-03-26 DIAGNOSIS — IMO0001 Reserved for inherently not codable concepts without codable children: Secondary | ICD-10-CM

## 2015-03-26 NOTE — Assessment & Plan Note (Signed)
Controlled, no change in medication DASH diet and commitment to daily physical activity for a minimum of 30 minutes discussed and encouraged, as a part of hypertension management. The importance of attaining a healthy weight is also discussed.  BP/Weight 03/26/2015 11/29/2014 09/27/2014 07/18/2014 05/17/2014 03/13/2014 09/01/5481  Systolic BP 234 688 737 308 168 387 065  Diastolic BP 74 78 75 68 76 70 78  Wt. (Lbs) 164 166.12 169.8 176.08 173.4 174.4 178  BMI 26.48 26.83 27.42 28.43 28 29.02 29.62

## 2015-03-26 NOTE — Assessment & Plan Note (Signed)
Unchanged Patient re-educated about  the importance of commitment to a  minimum of 150 minutes of exercise per week.  The importance of healthy food choices with portion control discussed. Encouraged to start a food diary, count calories and to consider  joining a support group. Sample diet sheets offered. Goals set by the patient for the next several months.   Weight /BMI 03/26/2015 11/29/2014 09/27/2014  WEIGHT 164 lb 166 lb 1.9 oz 169 lb 12.8 oz  HEIGHT 5' 6"  5' 6"  5' 6"   BMI 26.48 kg/m2 26.83 kg/m2 27.42 kg/m2    Current exercise per week 90 minutes.

## 2015-03-26 NOTE — Assessment & Plan Note (Signed)
Patient educated about the importance of limiting  Carbohydrate intake , the need to commit to daily physical activity for a minimum of 30 minutes , and to commit weight loss. The fact that changes in all these areas will reduce or eliminate all together the development of diabetes is stressed.   Diabetic Labs Latest Ref Rng 03/20/2015 07/11/2014 03/07/2014 10/25/2013 05/29/2013  HbA1c <5.7 % 6.0(H) 5.9(H) 6.3(H) 6.0(H) 6.1(H)  Chol 125 - 200 mg/dL 209(H) 206(H) 178 250(H) 205(H)  HDL >=46 mg/dL 105 84 72 76 81  Calc LDL <130 mg/dL 89 103(H) 84 155(H) 110(H)  Triglycerides <150 mg/dL 77 94 111 94 69  Creatinine 0.60 - 0.88 mg/dL 0.66 0.87 0.72 0.78 0.72   BP/Weight 03/26/2015 11/29/2014 09/27/2014 07/18/2014 05/17/2014 03/13/2014 0/31/5945  Systolic BP 859 292 446 286 381 771 165  Diastolic BP 74 78 75 68 76 70 78  Wt. (Lbs) 164 166.12 169.8 176.08 173.4 174.4 178  BMI 26.48 26.83 27.42 28.43 28 29.02 29.62   No flowsheet data found. Deteriorated

## 2015-03-26 NOTE — Assessment & Plan Note (Signed)
Unchanged, no real success with affordable medication,no change in management

## 2015-03-26 NOTE — Assessment & Plan Note (Signed)
Hyperlipidemia:Low fat diet discussed and encouraged.   Lipid Panel  Lab Results  Component Value Date   CHOL 209* 03/20/2015   HDL 105 03/20/2015   LDLCALC 89 03/20/2015   TRIG 77 03/20/2015   CHOLHDL 2.0 03/20/2015   Continue current dose of lipitor and lower fat intake

## 2015-03-26 NOTE — Patient Instructions (Signed)
Annual wellness in 5 month, call if you need me sooner  Please commit to daily aspirin 81 mg to reduce stroke risk  Commit to calcium in your food 600 mg daily, greek yogurt, low fat or skimmed milk are high in calcium  Continue healthy lifestyle, this will continue to positively impact  your health  You are referred for bone density scan  Careful, no more falls!  Fasting labs in 5 months  Thanks for choosing [facility], we consider it a privelige to serve you.  All the best for 2017!

## 2015-03-26 NOTE — Progress Notes (Signed)
Subjective:    Patient ID: [name] [name], female    DOB: 1932/09/03, 82 y.o.   MRN: [id number]  HPI   TYARRA NOLTON     MRN: [id number]      DOB: 1932-11-16   HPI [name] [name] is here for follow up and re-evaluation of chronic medical conditions, medication management and review of any available recent lab and radiology data.  Preventive health is updated, specifically  Cancer screening and Immunization.   Questions or concerns regarding consultations or procedures which the PT has had in the interim are  addressed. The PT denies any adverse reactions to current medications since the last visit.  There are no new concerns.  There are no specific complaints   ROS Denies recent fever or chills. Denies sinus pressure, nasal congestion, ear pain or sore throat. Denies chest congestion, productive cough or wheezing. Denies chest pains, palpitations and leg swelling Denies abdominal pain, nausea, vomiting,diarrhea or constipation.   Denies dysuria, frequency, hesitancy or incontinence. Denies joint pain, swelling and limitation in mobility. Denies headaches, seizures, numbness, or tingling. Denies depression, anxiety or insomnia. Denies skin break down or rash.   PE  BP 120/74 mmHg  Pulse 64  Resp 16  Ht 5' 6"  (1.676 m)  Wt 164 lb (74.39 kg)  BMI 26.48 kg/m2  SpO2 100%  Patient alert and oriented and in no cardiopulmonary distress.  HEENT: No facial asymmetry, EOMI,   oropharynx pink and moist.  Neck supple no JVD, no mass.  Chest: Clear to auscultation bilaterally.  CVS: S1, S2 no murmurs, no S3.Regular rate.  ABD: Soft non tender.   Ext: No edema  MS: Adequate ROM spine, shoulders, hips and knees.  Skin: Intact, no ulcerations or rash noted.  Psych: Good eye contact, normal affect. Memory intact not anxious or depressed appearing.  CNS: CN 2-12 intact, power,  normal throughout.no focal deficits noted.   Assessment & Plan   Essential hypertension Controlled, no  change in medication DASH diet and commitment to daily physical activity for a minimum of 30 minutes discussed and encouraged, as a part of hypertension management. The importance of attaining a healthy weight is also discussed.  BP/Weight 03/26/2015 11/29/2014 09/27/2014 07/18/2014 05/17/2014 03/13/2014 6/64/4034  Systolic BP 742 595 638 756 433 295 188  Diastolic BP 74 78 75 68 76 70 78  Wt. (Lbs) 164 166.12 169.8 176.08 173.4 174.4 178  BMI 26.48 26.83 27.42 28.43 28 29.02 29.62        Hyperlipidemia LDL goal <100 Hyperlipidemia:Low fat diet discussed and encouraged.   Lipid Panel  Lab Results  Component Value Date   CHOL 209* 03/20/2015   HDL 105 03/20/2015   LDLCALC 89 03/20/2015   TRIG 77 03/20/2015   CHOLHDL 2.0 03/20/2015   Continue current dose of lipitor and lower fat intake     Obesity, Class II, BMI 35-39.9, with comorbidity Unchanged Patient re-educated about  the importance of commitment to a  minimum of 150 minutes of exercise per week.  The importance of healthy food choices with portion control discussed. Encouraged to start a food diary, count calories and to consider  joining a support group. Sample diet sheets offered. Goals set by the patient for the next several months.   Weight /BMI 03/26/2015 11/29/2014 09/27/2014  WEIGHT 164 lb 166 lb 1.9 oz 169 lb 12.8 oz  HEIGHT 5' 6"  5' 6"  5' 6"   BMI 26.48 kg/m2 26.83 kg/m2 27.42 kg/m2    Current exercise per  week 90 minutes.   Prediabetes Patient educated about the importance of limiting  Carbohydrate intake , the need to commit to daily physical activity for a minimum of 30 minutes , and to commit weight loss. The fact that changes in all these areas will reduce or eliminate all together the development of diabetes is stressed.   Diabetic Labs Latest Ref Rng 03/20/2015 07/11/2014 03/07/2014 10/25/2013 05/29/2013  HbA1c <5.7 % 6.0(H) 5.9(H) 6.3(H) 6.0(H) 6.1(H)  Chol 125 - 200 mg/dL 209(H) 206(H) 178 250(H) 205(H)    HDL >=46 mg/dL 105 84 72 76 81  Calc LDL <130 mg/dL 89 103(H) 84 155(H) 110(H)  Triglycerides <150 mg/dL 77 94 111 94 69  Creatinine 0.60 - 0.88 mg/dL 0.66 0.87 0.72 0.78 0.72   BP/Weight 03/26/2015 11/29/2014 09/27/2014 07/18/2014 05/17/2014 03/13/2014 4/60/0298  Systolic BP 473 085 694 370 052 591 028  Diastolic BP 74 78 75 68 76 70 78  Wt. (Lbs) 164 166.12 169.8 176.08 173.4 174.4 178  BMI 26.48 26.83 27.42 28.43 28 29.02 29.62   No flowsheet data found. Deteriorated    Urinary incontinence Unchanged, no real success with affordable medication,no change in management      Review of Systems     Objective:   Physical Exam        Assessment & Plan:

## 2015-04-16 ENCOUNTER — Telehealth: Payer: Self-pay | Admitting: Family Medicine

## 2015-04-16 DIAGNOSIS — N63 Unspecified lump in unspecified breast: Secondary | ICD-10-CM

## 2015-04-16 NOTE — Telephone Encounter (Signed)
Patient is asking for a referral for a Diagnostic f/u mammo to be ordered and scheduled at [facility], Please advise?

## 2015-04-17 ENCOUNTER — Other Ambulatory Visit: Payer: Self-pay | Admitting: Family Medicine

## 2015-04-17 DIAGNOSIS — Z09 Encounter for follow-up examination after completed treatment for conditions other than malignant neoplasm: Secondary | ICD-10-CM

## 2015-04-17 DIAGNOSIS — N63 Unspecified lump in unspecified breast: Secondary | ICD-10-CM

## 2015-04-17 NOTE — Addendum Note (Signed)
Addended by: Denman George B on: 04/17/2015 01:05 PM   Modules accepted: Orders

## 2015-04-17 NOTE — Telephone Encounter (Signed)
Orders placed.

## 2015-04-30 ENCOUNTER — Other Ambulatory Visit: Payer: Self-pay | Admitting: Family Medicine

## 2015-04-30 ENCOUNTER — Encounter (HOSPITAL_COMMUNITY): Payer: Commercial Managed Care - HMO

## 2015-04-30 ENCOUNTER — Ambulatory Visit (HOSPITAL_COMMUNITY)
Admission: RE | Admit: 2015-04-30 | Discharge: 2015-04-30 | Disposition: A | Discharge status: Home / Self Care | Payer: Commercial Managed Care - HMO | Source: Ambulatory Visit | Attending: Family Medicine | Admitting: Family Medicine

## 2015-04-30 DIAGNOSIS — R921 Mammographic calcification found on diagnostic imaging of breast: Secondary | ICD-10-CM | POA: Insufficient documentation

## 2015-04-30 DIAGNOSIS — R928 Other abnormal and inconclusive findings on diagnostic imaging of breast: Secondary | ICD-10-CM

## 2015-04-30 DIAGNOSIS — N63 Unspecified lump in unspecified breast: Secondary | ICD-10-CM

## 2015-08-14 DIAGNOSIS — I10 Essential (primary) hypertension: Secondary | ICD-10-CM | POA: Diagnosis not present

## 2015-08-14 DIAGNOSIS — E559 Vitamin D deficiency, unspecified: Secondary | ICD-10-CM | POA: Diagnosis not present

## 2015-08-14 DIAGNOSIS — E785 Hyperlipidemia, unspecified: Secondary | ICD-10-CM | POA: Diagnosis not present

## 2015-08-14 DIAGNOSIS — R7309 Other abnormal glucose: Secondary | ICD-10-CM | POA: Diagnosis not present

## 2015-08-14 DIAGNOSIS — R7303 Prediabetes: Secondary | ICD-10-CM | POA: Diagnosis not present

## 2015-08-15 LAB — COMPLETE METABOLIC PANEL WITH GFR
ALBUMIN: 4.3 g/dL (ref 3.6–5.1)
ALK PHOS: 45 U/L (ref 33–130)
ALT: 43 U/L — AB (ref 6–29)
AST: 62 U/L — AB (ref 10–35)
BUN: 23 mg/dL (ref 7–25)
CALCIUM: 9.8 mg/dL (ref 8.6–10.4)
CO2: 27 mmol/L (ref 20–31)
CREATININE: 0.72 mg/dL (ref 0.60–0.88)
Chloride: 102 mmol/L (ref 98–110)
GFR, Est Non African American: 79 mL/min (ref >=60)
Glucose, Bld: 101 mg/dL — ABNORMAL HIGH (ref 65–99)
Potassium: 3.5 mmol/L (ref 3.5–5.3)
Sodium: 141 mmol/L (ref 135–146)
Total Bilirubin: 0.7 mg/dL (ref 0.2–1.2)
Total Protein: 6.6 g/dL (ref 6.1–8.1)

## 2015-08-15 LAB — CBC WITH DIFFERENTIAL/PLATELET
Basophils Absolute: 0 cells/uL (ref 0–200)
Basophils Relative: 0 %
EOS PCT: 1 %
Eosinophils Absolute: 46 cells/uL (ref 15–500)
HEMATOCRIT: 38.3 % (ref 35.0–45.0)
Hemoglobin: 12.5 g/dL (ref 11.7–15.5)
LYMPHS PCT: 24 %
Lymphs Abs: 1104 cells/uL (ref 850–3900)
MCH: 28.7 pg (ref 27.0–33.0)
MCHC: 32.6 g/dL (ref 32.0–36.0)
MCV: 88 fL (ref 80.0–100.0)
MPV: 11.6 fL (ref 7.5–12.5)
Monocytes Absolute: 368 cells/uL (ref 200–950)
Monocytes Relative: 8 %
Neutro Abs: 3082 cells/uL (ref 1500–7800)
Neutrophils Relative %: 67 %
PLATELETS: 200 10*3/uL (ref 140–400)
RBC: 4.35 MIL/uL (ref 3.80–5.10)
RDW: 13.7 % (ref 11.0–15.0)
WBC: 4.6 10*3/uL (ref 3.8–10.8)

## 2015-08-15 LAB — LIPID PANEL
CHOLESTEROL: 217 mg/dL — AB (ref 125–200)
HDL: 101 mg/dL (ref >=46)
LDL Cholesterol: 100 mg/dL (ref <130)
TRIGLYCERIDES: 79 mg/dL (ref <150)
Total CHOL/HDL Ratio: 2.1 Ratio (ref <=5.0)
VLDL: 16 mg/dL (ref <30)

## 2015-08-15 LAB — VITAMIN D 25 HYDROXY (VIT D DEFICIENCY, FRACTURES): VIT D 25 HYDROXY: 18 ng/mL — AB (ref 30–100)

## 2015-08-15 LAB — HEMOGLOBIN A1C
Hgb A1c MFr Bld: 6 % — ABNORMAL HIGH (ref <5.7)
MEAN PLASMA GLUCOSE: 126 mg/dL

## 2015-08-15 LAB — TSH: TSH: 3.13 m[IU]/L

## 2015-08-23 DIAGNOSIS — H25813 Combined forms of age-related cataract, bilateral: Secondary | ICD-10-CM | POA: Diagnosis not present

## 2015-08-23 DIAGNOSIS — H5200 Hypermetropia, unspecified eye: Secondary | ICD-10-CM | POA: Diagnosis not present

## 2015-08-23 DIAGNOSIS — H5210 Myopia, unspecified eye: Secondary | ICD-10-CM | POA: Diagnosis not present

## 2015-10-15 ENCOUNTER — Other Ambulatory Visit: Payer: Self-pay | Admitting: Family Medicine

## 2015-10-31 ENCOUNTER — Ambulatory Visit (INDEPENDENT_AMBULATORY_CARE_PROVIDER_SITE_OTHER): Payer: Commercial Managed Care - HMO | Admitting: Family Medicine

## 2015-10-31 ENCOUNTER — Encounter: Payer: Self-pay | Admitting: Family Medicine

## 2015-10-31 VITALS — BP 118/78 | HR 83 | Resp 16 | Ht 66.0 in | Wt 164.0 lb

## 2015-10-31 DIAGNOSIS — I10 Essential (primary) hypertension: Secondary | ICD-10-CM

## 2015-10-31 DIAGNOSIS — R7303 Prediabetes: Secondary | ICD-10-CM

## 2015-10-31 DIAGNOSIS — Z0000 Encounter for general adult medical examination without abnormal findings: Secondary | ICD-10-CM | POA: Diagnosis not present

## 2015-10-31 DIAGNOSIS — E785 Hyperlipidemia, unspecified: Secondary | ICD-10-CM

## 2015-10-31 DIAGNOSIS — M129 Arthropathy, unspecified: Secondary | ICD-10-CM

## 2015-10-31 DIAGNOSIS — M1711 Unilateral primary osteoarthritis, right knee: Secondary | ICD-10-CM

## 2015-10-31 NOTE — Patient Instructions (Addendum)
Annual physical exam in 3 month, call if you need me before   Fasting lipid, cmp, HBA1C in 3 month  PLEAS start making arrangements for right knee surgery as you have fallen and it feels as though it gives out at times  Script for walker with bench today   Fall Prevention in the Home  Falls can cause injuries. They can happen to people of all ages. There are many things you can do to make your home safe and to help prevent falls.  WHAT CAN I DO ON THE OUTSIDE OF MY HOME?  Regularly fix the edges of walkways and driveways and fix any cracks.  Remove anything that might make you trip as you walk through a door, such as a raised step or threshold.  Trim any bushes or trees on the path to your home.  Use bright outdoor lighting.  Clear any walking paths of anything that might make someone trip, such as rocks or tools.  Regularly check to see if handrails are loose or broken. Make sure that both sides of any steps have handrails.  Any raised decks and porches should have guardrails on the edges.  Have any leaves, snow, or ice cleared regularly.  Use sand or salt on walking paths during winter.  Clean up any spills in your garage right away. This includes oil or grease spills. WHAT CAN I DO IN THE BATHROOM?   Use night lights.  Install grab bars by the toilet and in the tub and shower. Do not use towel bars as grab bars.  Use non-skid mats or decals in the tub or shower.  If you need to sit down in the shower, use a plastic, non-slip stool.  Keep the floor dry. Clean up any water that spills on the floor as soon as it happens.  Remove soap buildup in the tub or shower regularly.  Attach bath mats securely with double-sided non-slip rug tape.  Do not have throw rugs and other things on the floor that can make you trip. WHAT CAN I DO IN THE BEDROOM?  Use night lights.  Make sure that you have a light by your bed that is easy to reach.  Do not use any sheets or blankets  that are too big for your bed. They should not hang down onto the floor.  Have a firm chair that has side arms. You can use this for support while you get dressed.  Do not have throw rugs and other things on the floor that can make you trip. WHAT CAN I DO IN THE KITCHEN?  Clean up any spills right away.  Avoid walking on wet floors.  Keep items that you use a lot in easy-to-reach places.  If you need to reach something above you, use a strong step stool that has a grab bar.  Keep electrical cords out of the way.  Do not use floor polish or wax that makes floors slippery. If you must use wax, use non-skid floor wax.  Do not have throw rugs and other things on the floor that can make you trip. WHAT CAN I DO WITH MY STAIRS?  Do not leave any items on the stairs.  Make sure that there are handrails on both sides of the stairs and use them. Fix handrails that are broken or loose. Make sure that handrails are as long as the stairways.  Check any carpeting to make sure that it is firmly attached to the stairs. Fix any carpet  that is loose or worn.  Avoid having throw rugs at the top or bottom of the stairs. If you do have throw rugs, attach them to the floor with carpet tape.  Make sure that you have a light switch at the top of the stairs and the bottom of the stairs. If you do not have them, ask someone to add them for you. WHAT ELSE CAN I DO TO HELP PREVENT FALLS?  Wear shoes that:  Do not have high heels.  Have rubber bottoms.  Are comfortable and fit you well.  Are closed at the toe. Do not wear sandals.  If you use a stepladder:  Make sure that it is fully opened. Do not climb a closed stepladder.  Make sure that both sides of the stepladder are locked into place.  Ask someone to hold it for you, if possible.  Clearly mark and make sure that you can see:  Any grab bars or handrails.  First and last steps.  Where the edge of each step is.  Use tools that help  you move around (mobility aids) if they are needed. These include:  Canes.  Walkers.  Scooters.  Crutches.  Turn on the lights when you go into a dark area. Replace any light bulbs as soon as they burn out.  Set up your furniture so you have a clear path. Avoid moving your furniture around.  If any of your floors are uneven, fix them.  If there are any pets around you, be aware of where they are.  Review your medicines with your doctor. Some medicines can make you feel dizzy. This can increase your chance of falling. Ask your doctor what other things that you can do to help prevent falls.   This information is not intended to replace advice given to you by your health care provider. Make sure you discuss any questions you have with your health care provider.   Document Released: 01/03/2009 Document Revised: 07/24/2014 Document Reviewed: 04/13/2014 Elsevier Interactive Patient Education [employer].

## 2015-10-31 NOTE — Progress Notes (Signed)
Preventive Screening-Counseling & Management   Patient present here today for a Medicare annual wellness visit.   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 Former Regulatory affairs officer, never smoked    Risk Factors  Current exercise habits:  Limited due to mobility issues and knee pan   Dietary issues discussed: heart healthy diet encouraged, limits fried fatty foods    Cardiac risk factors: htn   Depression Screen  (Note: if answer to either of the following is "Yes", a more complete depression screening is indicated)   Over the past two weeks, have you felt down, depressed or hopeless? No  Over the past two weeks, have you felt little interest or pleasure in doing things? No  Have you lost interest or pleasure in daily life? No  Do you often feel hopeless? No  Do you cry easily over simple problems? No   Activities of Daily Living  In your present state of health, do you have any difficulty performing the following activities?  Driving?: No Managing money?: No Feeding yourself?:No Getting from bed to chair?:No Climbing a flight of stairs?:yes, due to knee pain  Preparing food and eating?:No Bathing or showering?:No Getting dressed?:sometimes has trouble getting her pants on  Getting to the toilet?:No Using the toilet?:No Moving around from place to place?: uses a cane   Fall Risk Assessment In the past year have you fallen or had a near fall?:once Are you currently taking any medications that make you dizzy?:No   Hearing Difficulties: No Do you often ask people to speak up or repeat themselves?:No Do you experience ringing or noises in your ears?:No Do you have difficulty understanding soft or whispered voices?:No  Cognitive Testing  Alert? Yes Normal Appearance?Yes  Oriented to person? Yes Place? Yes  Time? Yes  Displays appropriate  judgment?Yes  Can read the correct time from a watch face? yes Are you having problems remembering things?No  Advanced Directives have been discussed with the patient?Yes, has living will and full code    List the Names of Other Physician/Practitioners you currently use: (updated)   Indicate any recent Medical Services you may have received from other than [facility] providers in the past year (date may be approximate).     Medicare Attestation  I have personally reviewed:  The patient's medical and social history  Their use of alcohol, tobacco or illicit drugs  Their current medications and supplements  The patient's functional ability including ADLs,fall risks, home safety risks, cognitive, and hearing and visual impairment  Diet and physical activities  Evidence for depression or mood disorders  The patient's weight, height, BMI, and visual acuity have been recorded in the chart. I have made referrals, counseling, and provided education to the patient based on review of the above and I have provided the patient with a written personalized care plan for preventive services.    Physical Exam BP 118/78   Pulse 83   Resp 16   Ht 5' 6"  (1.676 m)   Wt 164 lb (74.4 kg)   SpO2 97%   BMI 26.47 kg/m    Assessment & Plan:  Medicare annual wellness visit, subsequent Annual exam as documented. Counseling done  re healthy lifestyle involving commitment to 150 minutes exercise per week, heart healthy diet, and attaining healthy weight.The importance of adequate sleep also discussed. Regular seat belt use and home safety, is also discussed.  Changes in health habits are decided on by the patient with goals and time frames  set for achieving them. Immunization and cancer screening needs are specifically addressed at this visit.   Arthritis of right knee Worsening symptoms of buckling and near falls, recommend surgical intervention as soon as possible. Home safety and equipment needs  reviewed

## 2015-11-02 ENCOUNTER — Encounter: Payer: Self-pay | Admitting: Family Medicine

## 2015-11-02 NOTE — Assessment & Plan Note (Signed)

## 2015-11-02 NOTE — Assessment & Plan Note (Signed)
Worsening symptoms of buckling and near falls, recommend surgical intervention as soon as possible. Home safety and equipment needs reviewed

## 2016-01-13 ENCOUNTER — Ambulatory Visit (INDEPENDENT_AMBULATORY_CARE_PROVIDER_SITE_OTHER): Payer: Commercial Managed Care - HMO

## 2016-01-13 ENCOUNTER — Encounter: Payer: Self-pay | Admitting: Orthopedic Surgery

## 2016-01-13 ENCOUNTER — Ambulatory Visit (INDEPENDENT_AMBULATORY_CARE_PROVIDER_SITE_OTHER): Payer: Commercial Managed Care - HMO | Admitting: Orthopedic Surgery

## 2016-01-13 VITALS — BP 159/78 | HR 85 | Wt 163.0 lb

## 2016-01-13 DIAGNOSIS — G8929 Other chronic pain: Secondary | ICD-10-CM

## 2016-01-13 DIAGNOSIS — M1711 Unilateral primary osteoarthritis, right knee: Secondary | ICD-10-CM

## 2016-01-13 DIAGNOSIS — M25561 Pain in right knee: Secondary | ICD-10-CM

## 2016-01-13 NOTE — Progress Notes (Signed)
Patient ID: [name] [name], female   DOB: Dec 13, 1932, 82 y.o.   MRN: [id number]  Chief Complaint  Patient presents with  . Knee Pain    right knee pain    HPI [name] [name] is a 82 y.o. female.  Presents as a new patient  Complains of weakness in her right leg and that both legs feel heavy. The right knee gives way. She has history of degenerative disc disease  She does not complain of pain  Symptoms present for over 3 months  She has some difficulty squatting down and getting back up  Review of Systems Review of Systems  Constitutional: Negative for activity change, appetite change and fever.  Respiratory: Negative.   Cardiovascular: Negative.     Past Medical History:  Diagnosis Date  . Arthritis    spinal stenosis  . Elevated LFTs    secondary to fatty liver, negative work-up in 2011  . Hypercholesterolemia   . Hypertension   . Osteoarthritis    right knee  . PONV (postoperative nausea and vomiting)   . Uterine cancer ([place]) 08/2006   grade 1, no recurrence up to 2013    Past Surgical History:  Procedure Laterality Date  . 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 APPENDECTOMY  1973  . CHOLECYSTECTOMY  1973  . COLONOSCOPY    06/21/2007   TDD:UKGURK rectum/Sigmoid diverticula, diminutive hepatic flexure polyp s/p bx. Benign.   . COLONOSCOPY N/A 10/17/2012   Dr. Oneida Alar- moderate diverticulosis was noted in the sigmoid colon, moderate sized internal hemorrhoids. next tcs in10 years  . FOOT SURGERY  2007   Pins in toes on left foot, 5 to 6 yrs ago  . TONSILLECTOMY      Social History Social History  Substance Use Topics  . Smoking status: Never Smoker  . Smokeless tobacco: Never Used     Comment: Never smoked  . Alcohol use No    Allergies  Allergen Reactions  . Ciprofloxacin Hcl     Chills, sick, could not tolerate it  . Hydrocodone Nausea Only  . Penicillins     Current Meds  Medication Sig  . aspirin 81 MG tablet Take 81 mg by mouth  daily.  Marland Kitchen atorvastatin (LIPITOR) 10 MG tablet Take 1 tablet (10 mg total) by mouth daily.  . cholecalciferol (VITAMIN D) 1000 units tablet Take 1,000 Units by mouth daily.  Marland Kitchen triamterene-hydrochlorothiazide (MAXZIDE) 75-50 MG tablet TAKE 1 TABLET BY MOUTH DAILY.      Physical Exam Physical Exam BP (!) 159/78   Pulse 85   Wt 163 lb (73.9 kg)   BMI 26.31 kg/m   Gen. appearance. The patient is well-developed and well-nourished, grooming and hygiene are normal. There are no gross congenital abnormalities  The patient is alert and oriented to person place and time  Mood and affect are normal  Ambulation cane required  Examination reveals the following: On inspection we find no tenderness or swelling in the right or left knee  With the range of motion of  120 in each knee  Stability tests were normal  in both knees  Strength tests revealed grade 5- motor strength in both quadriceps  Skin right and left knee we find no rash ulceration or erythema  Sensation remains intact right and left leg  Impression vascular system shows no peripheral edema right and left ankle   Data Reviewed Patellofemoral and medial compartment moderate arthritis  Assessment    82 years old  requires a cane degenerative disc disease history complains of weakness.    Plan    Recommend physical therapy and bracing with economy hinged brace right knee  Follow-up as needed       Arther Abbott 01/13/2016, 10:08 AM

## 2016-01-13 NOTE — Patient Instructions (Signed)
Recommend physical therapy for quadriceps strengthening 3 times a week for 3 weeks  Economy hinged knee brace

## 2016-01-15 ENCOUNTER — Ambulatory Visit (HOSPITAL_COMMUNITY): Payer: Commercial Managed Care - HMO | Attending: Orthopedic Surgery | Admitting: Physical Therapy

## 2016-01-15 DIAGNOSIS — R2681 Unsteadiness on feet: Secondary | ICD-10-CM | POA: Diagnosis not present

## 2016-01-15 DIAGNOSIS — M25561 Pain in right knee: Secondary | ICD-10-CM | POA: Diagnosis not present

## 2016-01-15 DIAGNOSIS — G8929 Other chronic pain: Secondary | ICD-10-CM | POA: Insufficient documentation

## 2016-01-15 DIAGNOSIS — R262 Difficulty in walking, not elsewhere classified: Secondary | ICD-10-CM | POA: Insufficient documentation

## 2016-01-15 DIAGNOSIS — M6281 Muscle weakness (generalized): Secondary | ICD-10-CM | POA: Insufficient documentation

## 2016-01-15 NOTE — Patient Instructions (Signed)
   BRIDGING  While lying on your back, tighten your lower abdominals, squeeze your buttocks and then raise your buttocks off the floor/bed as creating a "Bridge" with your body. Hold and then lower yourself and repeat 10 times, twice a day.     Clam Shells  Lie down on your side with your knees bent and legs one on top of the other. Keeping your ankles together, slowly separate your knees (to look like a clam shell) and then return to the starting position.   As the exercise gets easier, add an elastic band around your knees for resistance.  Repeat 10 times each side, twice a day.   WALKING PROGRAM  Walk for 3 minutes continuously; this can be inside or outside, your choice. Do this for 4 days, then add 1-2 minutes to the time you are walking. Continue to progress your time every 3-4 days.

## 2016-01-15 NOTE — Therapy (Signed)
[name] [name], [place], [address] Phone: [phone]   Fax:  [phone]  Physical Therapy Evaluation  Patient Details  Name: [name] [name] MRN: [id number] Date of Birth: 21-Nov-1932 Referring Provider: Dr. Arther Abbott  Encounter Date: 01/15/2016      PT End of Session - 01/15/16 1656    Visit Number 1   Number of Visits 12   Date for PT Re-Evaluation 02/05/16   Authorization Type [employer]    Authorization Time Period 01/15/16 to 02/26/16   Authorization - Visit Number 1   Authorization - Number of Visits 10   PT Start Time 9518   PT Stop Time 1556   PT Time Calculation (min) 38 min   Activity Tolerance Patient tolerated treatment well   Behavior During Therapy [facility] for tasks assessed/performed      Past Medical History:  Diagnosis Date  . Arthritis    spinal stenosis  . Elevated LFTs    secondary to fatty liver, negative work-up in 2011  . Hypercholesterolemia   . Hypertension   . Osteoarthritis    right knee  . PONV (postoperative nausea and vomiting)   . Uterine cancer ([place]) 08/2006   grade 1, no recurrence up to 2013    Past Surgical History:  Procedure Laterality Date  . 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 APPENDECTOMY  1973  . CHOLECYSTECTOMY  1973  . COLONOSCOPY    06/21/2007   ACZ:YSAYTK rectum/Sigmoid diverticula, diminutive hepatic flexure polyp s/p bx. Benign.   . COLONOSCOPY N/A 10/17/2012   Dr. Oneida Alar- moderate diverticulosis was noted in the sigmoid colon, moderate sized internal hemorrhoids. next tcs in10 years  . FOOT SURGERY  2007   Pins in toes on left foot, 5 to 6 yrs ago  . TONSILLECTOMY      There were no vitals filed for this visit.       Subjective Assessment - 01/15/16 1522    Subjective Patient has been having trouble with her R knee for awhile now, maybe 3 years, and it has just gotten progressively worse over time. It is hard for her  to get in and out of car, harder for her to get her legs in her pants, get in and out of bathtub, etc. She did have 2 falls in the dining room about 3 months and 1 month ago due to weakness in the knee and it collapsing on her.    Pertinent History HTN, NASH, back pain with sciatica, pre-diabetic, OA, history of uterine CA (controlled/resolved), foot surgery L 2007   How long can you sit comfortably? unlimited    How long can you stand comfortably? 10-15 minutes    How long can you walk comfortably? not sure, she does not walk long distances generally    Patient Stated Goals reduce pain, improve mobility    Currently in Pain? No/denies  at worst, 36/10             [facility] PT Assessment - 01/15/16 0001      Assessment   Medical Diagnosis R knee arthritis    Referring Provider Dr. Arther Abbott   Onset Date/Surgical Date --  chronic    Next MD Visit not scheduled    Prior Therapy she had PT in the past, not recently      Precautions   Precaution Comments fall      Balance Screen   Has the patient fallen  in the past 6 months Yes   How many times? 3   Has the patient had a decrease in activity level because of a fear of falling?  Yes   Is the patient reluctant to leave their home because of a fear of falling?  No     Prior Function   Level of Independence Independent;Independent with basic ADLs;Independent with gait;Independent with transfers   Vocation Retired     AROM   Right Knee Extension -4   Right Knee Flexion 115     Strength   Right Hip Flexion 3-/5   Right Hip Extension 3-/5   Right Hip ABduction 3-/5   Left Hip Flexion 3/5   Left Hip Extension 3/5   Left Hip ABduction 3-/5   Right Knee Flexion 3/5   Right Knee Extension 4/5   Left Knee Flexion 3+/5   Left Knee Extension 4+/5   Right Ankle Dorsiflexion 5/5   Left Ankle Dorsiflexion 5/5     Ambulation/Gait   Gait Comments reduced gait speed, flexed at hips, reduced weight bearing/increased compensations R  LE      6 minute walk test results    Aerobic Endurance Distance Walked 316   Endurance additional comments 3MWT, SPC      High Level Balance   High Level Balance Comments TUG 17.89 seconds, SPC; SLS 1 second max B                            PT Education - 01/15/16 1656    Education provided Yes   Education Details prognosis, POC, HEP    Person(s) Educated Patient   Methods Explanation;Demonstration;Handout   Comprehension Verbalized understanding;Returned demonstration;Need further instruction          PT Short Term Goals - 01/15/16 1706      PT SHORT TERM GOAL #1   Title Patient to experience pain R knee no more than 2/10 in order to improve gait tolerance and QOL    Time 3   Period Weeks   Status New     PT SHORT TERM GOAL #2   Title Patient to be able to ambulate 515f with SPC in 3MWT in order to show general improvement in mobiltiy    Time 3   Period Weeks   Status New     PT SHORT TERM GOAL #3   Title Patient to be able  to complete TUG in 12 seconds or less with SPC in order to show improved balance/reduced fall risk    Time 3   Period Weeks   Status New     PT SHORT TERM GOAL #4   Title Patient to be independent in correctly and consistently performing appropriate HEP, to be updated PRN    Time 1   Period Weeks   Status New           PT Long Term Goals - 01/15/16 1709      PT LONG TERM GOAL #1   Title Patient to demonstrate functional strength at least 4+/5 in all tested groups in order to reduce fall risk and reduce knee pain    Time 6   Period Weeks   Status New     PT LONG TERM GOAL #2   Title Patient to be able to maintain SLS for 15 seconds on each LE in order to show improved balance/reduced fall risk    Time 6   Period Weeks  Status New     PT LONG TERM GOAL #3   Title Patient to be able to verbally explain the importance of regular knee strength with correct form in order to reduce OA related pain in order to  show improved self efficacy in managing condition    Time 6   Period Weeks   Status New     PT LONG TERM GOAL #4   Title Patient to be independent in correctly and consistently performing appropriate functional strengthening routine in OKC and CKC positions in order to improve self-efficacy in managing OA related pain/prevent exacerbation of pain    Time 6   Period Weeks   Status New               Plan - 01/15/16 1657    Clinical Impression Statement Patient arrives with ongoing knee pain, which is worse in the R than the L, and has been ongoing for about 3 years now; she does have ongoing low back pain with sciatica which is impacting her L knee/causing some L LE weakness as well. Upon examination, patient reveals severe and significant functional weakness, significant gait deviation with reduced gait tolerance, and general unsteadiness. She is quite active at the [facility] with various classes but DPT suspects that she is likely compensating quite a bit due to pain in her R LE and impacting the benefit she is receiving. She will benefit from skilled PT services to address functional limitations and assist in reaching optimal level of function.    Rehab Potential Good   Clinical Impairments Affecting Rehab Potential chronicity of impairment, involvement of low back pathology    PT Frequency 2x / week   PT Duration 6 weeks   PT Treatment/Interventions ADLs/Self Care Home Management;Biofeedback;Cryotherapy;Moist Heat;DME Instruction;Gait training;Stair training;Functional mobility training;Therapeutic activities;Therapeutic exercise;Balance training;Neuromuscular re-education;Patient/family education;Manual techniques;Passive range of motion;Energy conservation;Taping   PT Next Visit Plan review HEP and goals, expand HEP to [facility] LE strength if compliant; focus on functional strength/balance    PT Home Exercise Plan 01/15/16: bridge, sidelying clams, walking program    Consulted and Agree with  Plan of Care Patient      Patient will benefit from skilled therapeutic intervention in order to improve the following deficits and impairments:  Abnormal gait, Improper body mechanics, Pain, Decreased coordination, Postural dysfunction, Decreased activity tolerance, Decreased strength, Decreased balance, Difficulty walking, Impaired flexibility  Visit Diagnosis: Chronic pain of right knee - Plan: PT plan of care cert/re-cert  Difficulty in walking, not elsewhere classified - Plan: PT plan of care cert/re-cert  Muscle weakness (generalized) - Plan: PT plan of care cert/re-cert  Unsteadiness on feet - Plan: PT plan of care cert/re-cert      G-Codes - 93/57/01 1716    Functional Assessment Tool Used Based on skilled clinical assessment of ROM, strength, pain patterns, gait, balance    Functional Limitation Mobility: Walking and moving around   Mobility: Walking and Moving Around Current Status ([id number]) At least 40 percent but less than 60 percent impaired, limited or restricted   Mobility: Walking and Moving Around Goal Status [phone]) At least 20 percent but less than 40 percent impaired, limited or restricted       Problem List Patient Active Problem List   Diagnosis Date Noted  . 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 Medicare annual wellness visit, subsequent 07/18/2014  . Urinary incontinence 06/20/2012  . Vitamin D deficiency 08/22/2011  . Prediabetes 05/29/2011  . Overweight (BMI  25.0-29.9) 01/12/2010  . NASH (nonalcoholic steatohepatitis) 11/05/2009  . Arthritis of right knee 09/04/2009  . Hyperlipidemia LDL goal <100 09/08/2007  . Essential hypertension 09/08/2007    Deniece Ree PT, DPT [phone]  [facility] [address] [place], [place], [address] Phone: [phone]   Fax:  [phone]  Name: [name] [name] MRN: [id number] Date of Birth: 04-14-1932

## 2016-01-20 ENCOUNTER — Encounter: Payer: Self-pay | Admitting: Gastroenterology

## 2016-01-20 ENCOUNTER — Telehealth: Payer: Self-pay

## 2016-01-20 ENCOUNTER — Other Ambulatory Visit: Payer: Self-pay | Admitting: Family Medicine

## 2016-01-20 ENCOUNTER — Telehealth (HOSPITAL_COMMUNITY): Payer: Self-pay | Admitting: Family Medicine

## 2016-01-20 DIAGNOSIS — I10 Essential (primary) hypertension: Secondary | ICD-10-CM | POA: Diagnosis not present

## 2016-01-20 DIAGNOSIS — D539 Nutritional anemia, unspecified: Secondary | ICD-10-CM | POA: Diagnosis not present

## 2016-01-20 DIAGNOSIS — R7303 Prediabetes: Secondary | ICD-10-CM | POA: Diagnosis not present

## 2016-01-20 DIAGNOSIS — K625 Hemorrhage of anus and rectum: Secondary | ICD-10-CM

## 2016-01-20 DIAGNOSIS — E785 Hyperlipidemia, unspecified: Secondary | ICD-10-CM | POA: Diagnosis not present

## 2016-01-20 LAB — CBC WITH DIFFERENTIAL/PLATELET
BASOS ABS: 0 {cells}/uL (ref 0–200)
Basophils Relative: 0 %
Eosinophils Absolute: 116 cells/uL (ref 15–500)
Eosinophils Relative: 2 %
HCT: 32.7 % — ABNORMAL LOW (ref 35.0–45.0)
Hemoglobin: 11.1 g/dL — ABNORMAL LOW (ref 11.7–15.5)
LYMPHS PCT: 21 %
Lymphs Abs: 1218 cells/uL (ref 850–3900)
MCH: 29.7 pg (ref 27.0–33.0)
MCHC: 33.9 g/dL (ref 32.0–36.0)
MCV: 87.4 fL (ref 80.0–100.0)
MONO ABS: 348 {cells}/uL (ref 200–950)
MONOS PCT: 6 %
MPV: 11.4 fL (ref 7.5–12.5)
NEUTROS PCT: 71 %
Neutro Abs: 4118 cells/uL (ref 1500–7800)
PLATELETS: 202 10*3/uL (ref 140–400)
RBC: 3.74 MIL/uL — ABNORMAL LOW (ref 3.80–5.10)
RDW: 13 % (ref 11.0–15.0)
WBC: 5.8 10*3/uL (ref 3.8–10.8)

## 2016-01-20 LAB — IRON: IRON: 67 ug/dL (ref 45–160)

## 2016-01-20 LAB — FERRITIN: Ferritin: 471 ng/mL — ABNORMAL HIGH (ref 20–288)

## 2016-01-20 NOTE — Telephone Encounter (Signed)
Patient states that GI cannot see her 11/14

## 2016-01-20 NOTE — Telephone Encounter (Signed)
01/20/16 she asked that all appts be cx - she said that she had another physical problem coming up and she couldn't come now

## 2016-01-20 NOTE — Telephone Encounter (Signed)
Labs ordered and pt on her way to have labs done

## 2016-01-20 NOTE — Telephone Encounter (Signed)
pls add a cBC to fasting labs ordered in August and advise her to get in am She has diverticulosis, so if excess bleeding bRRB, or if light headed, go to ED, explain a complication of diverticulosis can be rectal bleeding intermittently  I have  Entered referral to have gI eval, Has appt with me for physical in Nov, need  To keep this

## 2016-01-20 NOTE — Telephone Encounter (Signed)
Spoke directly with pt, denies light headedness, reports painless rectal bleeding, denies diarrheah c/o excess foul smelling flatus. I advised eD eval if worsens, as she ahs diverticulosis, She is to have labs this morning , ta t were previously ordered with addition of stat CBc, iron and ferritin

## 2016-01-21 ENCOUNTER — Ambulatory Visit (HOSPITAL_COMMUNITY): Payer: Commercial Managed Care - HMO | Admitting: Physical Therapy

## 2016-01-21 LAB — COMPREHENSIVE METABOLIC PANEL
ALK PHOS: 39 U/L (ref 33–130)
ALT: 46 U/L — AB (ref 6–29)
AST: 62 U/L — ABNORMAL HIGH (ref 10–35)
Albumin: 4 g/dL (ref 3.6–5.1)
BUN: 34 mg/dL — ABNORMAL HIGH (ref 7–25)
CALCIUM: 10 mg/dL (ref 8.6–10.4)
CO2: 28 mmol/L (ref 20–31)
Chloride: 105 mmol/L (ref 98–110)
Creat: 0.87 mg/dL (ref 0.60–0.88)
Glucose, Bld: 110 mg/dL — ABNORMAL HIGH (ref 65–99)
Potassium: 3.2 mmol/L — ABNORMAL LOW (ref 3.5–5.3)
Sodium: 142 mmol/L (ref 135–146)
Total Bilirubin: 0.7 mg/dL (ref 0.2–1.2)
Total Protein: 6.6 g/dL (ref 6.1–8.1)

## 2016-01-21 LAB — HEMOGLOBIN A1C
Hgb A1c MFr Bld: 5.7 % — ABNORMAL HIGH (ref <5.7)
Mean Plasma Glucose: 117 mg/dL

## 2016-01-21 LAB — LIPID PANEL
CHOLESTEROL: 190 mg/dL (ref 125–200)
HDL: 84 mg/dL (ref >=46)
LDL Cholesterol: 92 mg/dL (ref <130)
TRIGLYCERIDES: 72 mg/dL (ref <150)
Total CHOL/HDL Ratio: 2.3 Ratio (ref <=5.0)
VLDL: 14 mg/dL (ref <30)

## 2016-01-23 ENCOUNTER — Encounter (HOSPITAL_COMMUNITY): Payer: Commercial Managed Care - HMO

## 2016-01-23 ENCOUNTER — Other Ambulatory Visit: Payer: Self-pay

## 2016-01-23 MED ORDER — POTASSIUM CHLORIDE ER 10 MEQ PO TBCR: 10.0000 meq | EXTENDED_RELEASE_TABLET | Freq: Every day | ORAL | 1 refills | Status: DC

## 2016-01-28 ENCOUNTER — Encounter (HOSPITAL_COMMUNITY): Payer: Commercial Managed Care - HMO | Admitting: Physical Therapy

## 2016-01-30 ENCOUNTER — Encounter (HOSPITAL_COMMUNITY): Payer: Commercial Managed Care - HMO | Admitting: Physical Therapy

## 2016-01-31 ENCOUNTER — Encounter: Payer: Self-pay | Admitting: Family Medicine

## 2016-01-31 ENCOUNTER — Ambulatory Visit (INDEPENDENT_AMBULATORY_CARE_PROVIDER_SITE_OTHER): Payer: Commercial Managed Care - HMO | Admitting: Family Medicine

## 2016-01-31 VITALS — BP 126/64 | HR 86 | Resp 18 | Ht 66.0 in | Wt 160.0 lb

## 2016-01-31 DIAGNOSIS — E785 Hyperlipidemia, unspecified: Secondary | ICD-10-CM

## 2016-01-31 DIAGNOSIS — E559 Vitamin D deficiency, unspecified: Secondary | ICD-10-CM

## 2016-01-31 DIAGNOSIS — Z0000 Encounter for general adult medical examination without abnormal findings: Secondary | ICD-10-CM

## 2016-01-31 DIAGNOSIS — I10 Essential (primary) hypertension: Secondary | ICD-10-CM

## 2016-01-31 DIAGNOSIS — R7303 Prediabetes: Secondary | ICD-10-CM

## 2016-01-31 NOTE — Patient Instructions (Addendum)
F/u in 4 month, call if you need me before  Fasting lipid, cmp , hBA1c and vit d in 4 months.  Blood sugar and cholesteorl have improved.  Need bone density, will order if you agree  Thank you  for choosing [facility]. We consider it a privelige to serve you.  Delivering excellent health care in a caring and  compassionate way is our goal.  Partnering with you,  so that together we can achieve this goal is our strategy.

## 2016-02-02 ENCOUNTER — Encounter: Payer: Self-pay | Admitting: Family Medicine

## 2016-02-02 NOTE — Progress Notes (Signed)
    [name] [name]     MRN: [id number]      DOB: 05/26/1932  HPI: Patient is in for annual physical exam. No other health concerns are expressed or addressed at the visit. Recent labs, if available are reviewed. Immunization is reviewed , and  updated if needed.   PE:  BP 126/64   Pulse 86   Resp 18   Ht 5' 6"  (1.676 m)   Wt 160 lb (72.6 kg)   SpO2 98%   BMI 25.82 kg/m   Pleasant  female, alert and oriented x 3, in no cardio-pulmonary distress. Afebrile. HEENT No facial trauma or asymetry. Sinuses non tender.  Extra occullar muscles intactExternal ears normal, tympanic membranes clear. Oropharynx moist, no exudate. Neck: supple, no adenopathy,JVD or thyromegaly.No bruits.  Chest: Clear to ascultation bilaterally.No crackles or wheezes. Non tender to palpation  Breast: No asymetry,no masses or lumps. No tenderness. No nipple discharge or inversion. No axillary or supraclavicular adenopathy  Cardiovascular system; Heart sounds normal,  S1 and  S2 ,no S3.  No murmur, or thrill. Apical beat not displaced Peripheral pulses normal.  Abdomen: Soft, non tender, no organomegaly or masses. No bruits. Bowel sounds normal. No guarding, tenderness or rebound.  Rectal:  Normal sphincter tone. No rectal mass. Guaiac negative stool.  GU: External genitalia normal female genitalia , normal female distribution of hair. No lesions. Urethral meatus normal in size, no  Prolapse, no lesions visibly  Present. Bladder non tender. Vagina pink and moist , with no visible lesions , discharge present . Adequate pelvic support no  cystocele or rectocele noted  Uterus   absent no adnexal masses, no adnexal tenderness.   Musculoskeletal exam: Markedly decreased  ROM of spine, hips , shoulders and knees. Kyphosis noted.Deformity and crepitus of right knee  Neurologic: Cranial nerves 2 to 12 intact. Power, tone ,sensation and reflexes normal throughout. No disturbance in gait. No  tremor.  Skin: Intact, no ulceration, erythema , scaling or rash noted. Pigmentation normal throughout  Psych; Normal mood and affect. Judgement and concentration normal   Assessment & Plan:  Annual physical exam Annual exam as documented. Counseling done  re healthy lifestyle involving commitment to 150 minutes exercise per week, heart healthy diet, and attaining healthy weight.The importance of adequate sleep also discussed. Regular seat belt use and home safety, is also discussed. Changes in health habits are decided on by the patient with goals and time frames  set for achieving them. Immunization and cancer screening needs are specifically addressed at this visit.

## 2016-02-02 NOTE — Assessment & Plan Note (Signed)

## 2016-02-04 ENCOUNTER — Encounter: Payer: Self-pay | Admitting: Gastroenterology

## 2016-02-04 ENCOUNTER — Ambulatory Visit (INDEPENDENT_AMBULATORY_CARE_PROVIDER_SITE_OTHER): Payer: Commercial Managed Care - HMO | Admitting: Gastroenterology

## 2016-02-04 ENCOUNTER — Encounter (HOSPITAL_COMMUNITY): Payer: Commercial Managed Care - HMO | Admitting: Physical Therapy

## 2016-02-04 VITALS — BP 134/75 | HR 91 | Temp 97.6°F | Ht 66.0 in | Wt 160.4 lb

## 2016-02-04 DIAGNOSIS — K625 Hemorrhage of anus and rectum: Secondary | ICD-10-CM

## 2016-02-04 DIAGNOSIS — D62 Acute posthemorrhagic anemia: Secondary | ICD-10-CM | POA: Insufficient documentation

## 2016-02-04 NOTE — Patient Instructions (Signed)
1. Please have your labs done first week of December. 2. Call if recurrent rectal bleeding. 3. I will discuss recent episode of rectal bleeding with Dr. Oneida Alar. Further recommendations to follow.

## 2016-02-04 NOTE — Progress Notes (Addendum)
REVIEWED. LAST TCS 2014: [place], EXCELLENT PREP. PT NEEDS EGD +/- GIVENS IF NO SOURCE FOR ANEMIA IDENTIFIED,   Primary Care Physician: [name], MD  Primary Gastroenterologist:  Barney Drain, MD   Chief Complaint  Patient presents with  . Rectal Bleeding    HPI: [name] [name] is a 82 y.o. female here for further evaluation of recent episode of rectal bleeding. Patient reports episode of dark red blood per rectum on October 30. She saw blood twice in the stool and once when she wiped. Denies associated abdominal pain, diarrhea. No lightheadedness. She has chronic gas. Bowel movements every couple of days or so. She saw Dr. Moshe Cipro for routine physical 10 days later, rectal exam was benign, stool heme-negative. The day of bleeding, she did have a hemoglobin 11.1, hematocrit 32.7. Back in May her hemoglobin was 12.5.  Patient took a bottle of magnesium citrate the day of this episode after the bleeding started because she felt like she may need to have a bowel movement. No subsequent bleeding noted. "The bleeding left as quickly as it can".  Patient's last colonoscopy was July 2014. She had moderate diverticulosis and moderate internal hemorrhoids.  Patient's weight has been stable up until one month ago sure she's lost about 3 pounds. A year ago she was 6 pounds heavier. 2 years ago, 15 pounds heavier.  Current Outpatient Prescriptions  Medication Sig Dispense Refill  . aspirin 81 MG tablet Take 81 mg by mouth daily.    Marland Kitchen atorvastatin (LIPITOR) 10 MG tablet Take 1 tablet (10 mg total) by mouth daily. 90 tablet 1  . cholecalciferol (VITAMIN D) 1000 units tablet Take 1,000 Units by mouth daily.    . potassium chloride (KLOR-CON 10) 10 MEQ tablet Take 1 tablet (10 mEq total) by mouth daily. 90 tablet 1  . triamterene-hydrochlorothiazide (MAXZIDE) 75-50 MG tablet TAKE 1 TABLET BY MOUTH DAILY. 90 tablet 1   No current facility-administered medications for this visit.     Allergies as  of 02/04/2016 - Review Complete 02/04/2016  Allergen Reaction Noted  . Ciprofloxacin hcl  06/23/2010  . Hydrocodone Nausea Only 01/19/2012  . Penicillins  09/08/2007   Past Medical History:  Diagnosis Date  . Arthritis    spinal stenosis  . Elevated LFTs    secondary to fatty liver, negative work-up in 2011  . Hypercholesterolemia   . Hypertension   . Osteoarthritis    right knee  . PONV (postoperative nausea and vomiting)   . Uterine cancer ([place]) 08/2006   grade 1, no recurrence up to 2013   Past Surgical History:  Procedure Laterality Date  . 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 APPENDECTOMY  1973  . CHOLECYSTECTOMY  1973  . COLONOSCOPY    06/21/2007   [id number]:[id number] rectum/Sigmoid diverticula, diminutive hepatic flexure polyp s/p bx. Benign.   . COLONOSCOPY N/A 10/17/2012   Dr. Oneida Alar- moderate diverticulosis was noted in the sigmoid colon, moderate sized internal hemorrhoids. next tcs in10 years  . FOOT SURGERY  2007   Pins in toes on left foot, 5 to 6 yrs ago  . TONSILLECTOMY      ROS:  General: Negative for anorexia,  fever, chills, fatigue, weakness. See history of present illness. ENT: Negative for hoarseness, difficulty swallowing , nasal congestion. CV: Negative for chest pain, angina, palpitations, dyspnea on exertion, peripheral edema.  Respiratory: Negative for dyspnea at rest, dyspnea on exertion, cough, sputum, wheezing.  GI: See history of present illness.  GU:  Negative for dysuria, hematuria, urinary incontinence, urinary frequency, nocturnal urination.  Endo: Negative for unusual weight change.    Physical Examination:   BP 134/75   Pulse 91   Temp 97.6 F (36.4 C) (Oral)   Ht 5' 6"  (1.676 m)   Wt 160 lb 6.4 oz (72.8 kg)   BMI 25.89 kg/m   General: Well-nourished, well-developed in no acute distress.  Eyes: No icterus. Mouth: Oropharyngeal mucosa moist and pink , no lesions erythema or exudate. Lungs: Clear to auscultation  bilaterally.  Heart: Regular rate and rhythm, no murmurs rubs or gallops.  Abdomen: Bowel sounds are normal, nontender, nondistended, no hepatosplenomegaly or masses, no abdominal bruits or hernia , no rebound or guarding.   Extremities: No lower extremity edema. No clubbing or deformities. Neuro: Alert and oriented x 4   Skin: Warm and dry, no jaundice.   Psych: Alert and cooperative, normal mood and affect.  Labs:  Lab Results  Component Value Date   CREATININE 0.87 01/20/2016   BUN 34 (H) 01/20/2016   NA 142 01/20/2016   K 3.2 (L) 01/20/2016   CL 105 01/20/2016   CO2 28 01/20/2016   Lab Results  Component Value Date   ALT 46 (H) 01/20/2016   AST 62 (H) 01/20/2016   ALKPHOS 39 01/20/2016   BILITOT 0.7 01/20/2016   Lab Results  Component Value Date   HGBA1C 5.7 (H) 01/20/2016   Lab Results  Component Value Date   IRON 67 01/20/2016         FERRITIN 471 (H) 01/20/2016   Lab Results  Component Value Date   TSH 3.13 08/14/2015     Imaging Studies: Dg Knee Ap/lat W/sunrise Right  Result Date: 01/13/2016 3 views right knee Knee pain patellofemoral arthritis grade 3 Tibiofemoral arthritis medial compartment with soft tissue swelling probable effusion some mild osteopenia Impression patellofemoral arthritis and medial compartment arthritis moderate

## 2016-02-04 NOTE — Assessment & Plan Note (Signed)
Recent episode of small to moderate volume hematochezia. Hemoglobin with decline as outlined above. Last colonoscopy 3 years ago. Patient was 6 pound weight loss over the past one year, 15 pounds over the past 2 years. States unintentional.  Suspect recent bleeding diverticular in nature.  We will recheck her CBC in 2 weeks. Will discuss findings with Dr. Oneida Alar. If she has recurrent bleeding would offer colonoscopy, at this time may not be necessary. Await Dr. Oneida Alar input.  We will have her come back to see Dr. Oneida Alar in 3 months. Evaluate for further weight loss. If patient notices additional 4-5 pound weight loss she will let us know.

## 2016-02-04 NOTE — Progress Notes (Signed)
CC'ED TO PCP 

## 2016-02-05 NOTE — Progress Notes (Signed)
Please let patient know that Dr. Oneida Alar did not recommend a colonoscopy at this time however she does recommend an upper endoscopy plus or minus Givens capsule for GI bleeding and anemia/weight loss.  If patient is agreeable, please schedule her for EGD plus or minus Givens capsule.

## 2016-02-06 ENCOUNTER — Encounter (HOSPITAL_COMMUNITY): Payer: Commercial Managed Care - HMO | Admitting: Physical Therapy

## 2016-02-06 NOTE — Progress Notes (Signed)
LMOM to call.

## 2016-02-10 ENCOUNTER — Telehealth: Payer: Self-pay

## 2016-02-10 NOTE — Telephone Encounter (Signed)
Update H&P before EGD+/-GIVENS on 03/20/16.

## 2016-02-10 NOTE — Progress Notes (Signed)
Pt wanted to wait until 03/20/16 to have the EGD+/-GIVENS.

## 2016-02-10 NOTE — Progress Notes (Signed)
PT is aware and OK to schedule.

## 2016-02-10 NOTE — Telephone Encounter (Signed)
Instructions are in the mail

## 2016-02-11 ENCOUNTER — Encounter (HOSPITAL_COMMUNITY): Payer: Commercial Managed Care - HMO

## 2016-02-11 NOTE — Telephone Encounter (Signed)
No PA is needed for the EGD or the GIVENS    REF# [id number]

## 2016-02-11 NOTE — Telephone Encounter (Signed)
Noted to update H&P prior to procedures.

## 2016-02-12 ENCOUNTER — Ambulatory Visit (HOSPITAL_COMMUNITY): Payer: Commercial Managed Care - HMO | Admitting: Physical Therapy

## 2016-02-18 ENCOUNTER — Encounter (HOSPITAL_COMMUNITY): Payer: Commercial Managed Care - HMO | Admitting: Physical Therapy

## 2016-02-20 ENCOUNTER — Encounter (HOSPITAL_COMMUNITY): Payer: Commercial Managed Care - HMO | Admitting: Physical Therapy

## 2016-03-02 ENCOUNTER — Telehealth: Payer: Self-pay

## 2016-03-02 NOTE — Telephone Encounter (Signed)
Pt called and she accidentally threw away lab order from last OV. Copy of lab order for CBC w/Diff/platelet mailed to pt.

## 2016-03-04 ENCOUNTER — Other Ambulatory Visit: Payer: Self-pay | Admitting: Family Medicine

## 2016-03-06 DIAGNOSIS — D62 Acute posthemorrhagic anemia: Secondary | ICD-10-CM | POA: Diagnosis not present

## 2016-03-06 DIAGNOSIS — K625 Hemorrhage of anus and rectum: Secondary | ICD-10-CM | POA: Diagnosis not present

## 2016-03-07 LAB — CBC WITH DIFFERENTIAL/PLATELET
BASOS ABS: 0 {cells}/uL (ref 0–200)
BASOS PCT: 0 %
EOS ABS: 54 {cells}/uL (ref 15–500)
Eosinophils Relative: 1 %
HEMATOCRIT: 35 % (ref 35.0–45.0)
Hemoglobin: 11.2 g/dL — ABNORMAL LOW (ref 11.7–15.5)
LYMPHS PCT: 31 %
Lymphs Abs: 1674 cells/uL (ref 850–3900)
MCH: 28.7 pg (ref 27.0–33.0)
MCHC: 32 g/dL (ref 32.0–36.0)
MCV: 89.7 fL (ref 80.0–100.0)
MONO ABS: 432 {cells}/uL (ref 200–950)
MONOS PCT: 8 %
MPV: 11.4 fL (ref 7.5–12.5)
NEUTROS PCT: 60 %
Neutro Abs: 3240 cells/uL (ref 1500–7800)
PLATELETS: 199 10*3/uL (ref 140–400)
RBC: 3.9 MIL/uL (ref 3.80–5.10)
RDW: 13.4 % (ref 11.0–15.0)
WBC: 5.4 10*3/uL (ref 3.8–10.8)

## 2016-03-09 NOTE — Telephone Encounter (Signed)
I called and updated pt's meds. She has not had any change in meds.  Ginger, pt know's when it is scheduled for but she has questions and I did not see that a letter had been mailed.  Can you please call pt.

## 2016-03-09 NOTE — Telephone Encounter (Signed)
Talked with her any answered her questions

## 2016-03-11 NOTE — Progress Notes (Signed)
Hgb stable. Hct normal.  Is she on the schedule for EGD +/- Capsule?

## 2016-03-12 ENCOUNTER — Other Ambulatory Visit: Payer: Self-pay

## 2016-03-12 DIAGNOSIS — R634 Abnormal weight loss: Secondary | ICD-10-CM

## 2016-03-12 DIAGNOSIS — D649 Anemia, unspecified: Secondary | ICD-10-CM

## 2016-03-12 DIAGNOSIS — K274 Chronic or unspecified peptic ulcer, site unspecified, with hemorrhage: Secondary | ICD-10-CM

## 2016-03-12 NOTE — Progress Notes (Signed)
LMOM for a return call. Per Ginger pt is scheduled for 03/20/2016.

## 2016-03-20 ENCOUNTER — Encounter (HOSPITAL_COMMUNITY): Payer: Self-pay | Admitting: *Deleted

## 2016-03-20 ENCOUNTER — Ambulatory Visit (HOSPITAL_COMMUNITY)
Admission: RE | Admit: 2016-03-20 | Discharge: 2016-03-20 | Disposition: A | Discharge status: Home / Self Care | Payer: Commercial Managed Care - HMO | Source: Ambulatory Visit | Attending: Gastroenterology | Admitting: Gastroenterology

## 2016-03-20 ENCOUNTER — Telehealth: Payer: Self-pay | Admitting: Gastroenterology

## 2016-03-20 ENCOUNTER — Encounter (HOSPITAL_COMMUNITY)
Admission: RE | Disposition: A | Discharge status: Home / Self Care | Payer: Self-pay | Source: Ambulatory Visit | Attending: Gastroenterology

## 2016-03-20 DIAGNOSIS — K222 Esophageal obstruction: Secondary | ICD-10-CM | POA: Insufficient documentation

## 2016-03-20 DIAGNOSIS — D649 Anemia, unspecified: Secondary | ICD-10-CM | POA: Insufficient documentation

## 2016-03-20 DIAGNOSIS — K274 Chronic or unspecified peptic ulcer, site unspecified, with hemorrhage: Secondary | ICD-10-CM

## 2016-03-20 DIAGNOSIS — M1990 Unspecified osteoarthritis, unspecified site: Secondary | ICD-10-CM | POA: Insufficient documentation

## 2016-03-20 DIAGNOSIS — I10 Essential (primary) hypertension: Secondary | ICD-10-CM | POA: Insufficient documentation

## 2016-03-20 DIAGNOSIS — K760 Fatty (change of) liver, not elsewhere classified: Secondary | ICD-10-CM | POA: Insufficient documentation

## 2016-03-20 DIAGNOSIS — K2970 Gastritis, unspecified, without bleeding: Secondary | ICD-10-CM | POA: Diagnosis not present

## 2016-03-20 DIAGNOSIS — E7800 Pure hypercholesterolemia, unspecified: Secondary | ICD-10-CM | POA: Insufficient documentation

## 2016-03-20 DIAGNOSIS — K2980 Duodenitis without bleeding: Secondary | ICD-10-CM | POA: Insufficient documentation

## 2016-03-20 DIAGNOSIS — M1711 Unilateral primary osteoarthritis, right knee: Secondary | ICD-10-CM | POA: Diagnosis not present

## 2016-03-20 DIAGNOSIS — R634 Abnormal weight loss: Secondary | ICD-10-CM

## 2016-03-20 DIAGNOSIS — Z7982 Long term (current) use of aspirin: Secondary | ICD-10-CM | POA: Insufficient documentation

## 2016-03-20 DIAGNOSIS — Z8542 Personal history of malignant neoplasm of other parts of uterus: Secondary | ICD-10-CM | POA: Diagnosis not present

## 2016-03-20 DIAGNOSIS — K3189 Other diseases of stomach and duodenum: Secondary | ICD-10-CM | POA: Insufficient documentation

## 2016-03-20 DIAGNOSIS — Z90710 Acquired absence of both cervix and uterus: Secondary | ICD-10-CM | POA: Insufficient documentation

## 2016-03-20 DIAGNOSIS — K625 Hemorrhage of anus and rectum: Secondary | ICD-10-CM | POA: Diagnosis not present

## 2016-03-20 HISTORY — PX: GIVENS CAPSULE STUDY: SHX5432

## 2016-03-20 HISTORY — PX: ESOPHAGOGASTRODUODENOSCOPY: SHX5428

## 2016-03-20 SURGERY — EGD (ESOPHAGOGASTRODUODENOSCOPY)
Anesthesia: Moderate Sedation

## 2016-03-20 MED ORDER — MIDAZOLAM HCL 5 MG/5ML IJ SOLN
INTRAMUSCULAR | Status: AC
  Filled 2016-03-20: qty 5

## 2016-03-20 MED ORDER — PROMETHAZINE HCL 25 MG/ML IJ SOLN
INTRAMUSCULAR | Status: DC
Start: 2016-03-20 — End: 2016-03-20
  Filled 2016-03-20: qty 1

## 2016-03-20 MED ORDER — FENTANYL CITRATE (PF) 100 MCG/2ML IJ SOLN
INTRAMUSCULAR | Status: DC | PRN
  Administered 2016-03-20 (×2): 25 ug via INTRAVENOUS

## 2016-03-20 MED ORDER — PROMETHAZINE HCL 25 MG/ML IJ SOLN
INTRAMUSCULAR | Status: DC | PRN
  Administered 2016-03-20: 6.25 mg via INTRAVENOUS

## 2016-03-20 MED ORDER — FENTANYL CITRATE (PF) 100 MCG/2ML IJ SOLN
INTRAMUSCULAR | Status: AC
  Filled 2016-03-20: qty 2

## 2016-03-20 MED ORDER — MIDAZOLAM HCL 5 MG/5ML IJ SOLN
INTRAMUSCULAR | Status: DC | PRN
  Administered 2016-03-20: 2 mg via INTRAVENOUS
  Administered 2016-03-20: 1 mg via INTRAVENOUS
  Administered 2016-03-20: 2 mg via INTRAVENOUS

## 2016-03-20 MED ORDER — SODIUM CHLORIDE 0.9 % IV SOLN
INTRAVENOUS | Status: DC
  Administered 2016-03-20: 08:00:00 via INTRAVENOUS

## 2016-03-20 MED ORDER — MEPERIDINE HCL 100 MG/ML IJ SOLN
INTRAMUSCULAR | Status: AC
  Filled 2016-03-20: qty 1

## 2016-03-20 MED ORDER — PANTOPRAZOLE SODIUM 40 MG PO TBEC: DELAYED_RELEASE_TABLET | ORAL | 11 refills | Status: DC

## 2016-03-20 MED ORDER — LIDOCAINE VISCOUS 2 % MT SOLN
OROMUCOSAL | Status: DC | PRN
  Administered 2016-03-20: 1 via OROMUCOSAL

## 2016-03-20 NOTE — Discharge Instructions (Signed)
You have gastritis & DUODENITIS DUE TO YOUR USING ASPIRIN. IT IS CAUSING YOUR BLOOD COUNT TO BE LOW. YOU DO NOT NED A GIVENS CAPSULE AT THIS TIME.I biopsied your stomach & DUODENUM.   NO ASPIRIN, BC/GOODY POWDERS, IBUPROFEN/MOTRIN, OR NAPROXEN/ALEVE FOR 2 WEEKS.  START PROTONIX DAILY WITH BREAKFAST to help your stomach heal and to prevent ulcers.  YOUR BIOPSY RESULTS WILL BE AVAILABLE IN MY CHART AFTER JAN 3 AND MY OFFICE WILL CONTACT YOU IN 10-14 DAYS WITH YOUR RESULTS.   FOLLOW UP IN 3 MOS WITH DR. Rishan Oyama. YOU WILL NEED A CBC 1 WEEK PRIOR TO YOUR NEXT OUTPATIENT VISIT.   UPPER ENDOSCOPY AFTER CARE Read the instructions outlined below and refer to this sheet in the next week. These discharge instructions provide you with general information on caring for yourself after you leave the hospital. While your treatment has been planned according to the most current medical practices available, unavoidable complications occasionally occur. If you have any problems or questions after discharge, call DR. Sherene Plancarte, [phone].  ACTIVITY  You may resume your regular activity, but move at a slower pace for the next 24 hours.   Take frequent rest periods for the next 24 hours.   Walking will help get rid of the air and reduce the bloated feeling in your belly (abdomen).   No driving for 24 hours (because of the medicine (anesthesia) used during the test).   You may shower.   Do not sign any important legal documents or operate any machinery for 24 hours (because of the anesthesia used during the test).    NUTRITION  Drink plenty of fluids.   You may resume your normal diet as instructed by your doctor.   Begin with a light meal and progress to your normal diet. Heavy or fried foods are harder to digest and may make you feel sick to your stomach (nauseated).   Avoid alcoholic beverages for 24 hours or as instructed.    MEDICATIONS  You may resume your normal medications.   WHAT YOU  CAN EXPECT TODAY  Some feelings of bloating in the abdomen.   Passage of more gas than usual.    IF YOU HAD A BIOPSY TAKEN DURING THE UPPER ENDOSCOPY:  Eat a soft diet IF YOU HAVE NAUSEA, BLOATING, ABDOMINAL PAIN, OR VOMITING.    FINDING OUT THE RESULTS OF YOUR TEST Not all test results are available during your visit. DR. Oneida Alar WILL CALL YOU WITHIN 14 DAYS OF YOUR PROCEDUE WITH YOUR RESULTS. Do not assume everything is normal if you have not heard from DR. Ellenie Salome, CALL HER OFFICE AT [phone].  SEEK IMMEDIATE MEDICAL ATTENTION AND CALL THE OFFICE: [phone] IF:  You have more than a spotting of blood in your stool.   Your belly is swollen (abdominal distention).   You are nauseated or vomiting.   You have a temperature over 101F.   You have abdominal pain or discomfort that is severe or gets worse throughout the day.   Gastritis/DUODENITIS  Gastritis is an inflammation (the body's way of reacting to injury and/or infection) of the stomach. DUODENITIS is an inflammation (the body's way of reacting to injury and/or infection) of the FIRST PART OF THE SMALL INTESTINES. It is often caused by bacterial (germ) infections. It can also be caused BY ASPIRIN, BC/GOODY POWDER'S, (IBUPROFEN) MOTRIN, OR ALEVE (NAPROXEN), chemicals (including alcohol), SPICY FOODS, and medications. This illness may be associated with generalized malaise (feeling tired, not well), UPPER ABDOMINAL STOMACH cramps, and  fever. One common bacterial cause of gastritis is an organism known as H. Pylori. This can be treated with antibiotics.

## 2016-03-20 NOTE — Telephone Encounter (Signed)
PATIENT WILL NEED CBC ONE WEEK PRIOR TO NEXT APPOINTMENT

## 2016-03-20 NOTE — Progress Notes (Signed)
2 nurses assisting patient to the car after having upper endoscopy; as patient was getting into the car:  left leg was in the car and as she was putting right leg into the car, patient's right knee buckled;  both nurses caught patient as she was going down.  Patient never hit the ground.  Nurses assisted patient to get both legs into car and seatbelt buckled.  Patient left with husband driving.

## 2016-03-20 NOTE — Op Note (Signed)
[facility] Patient Name: [name] [name] Procedure Date: 03/20/2016 8:39 AM MRN: [id number] Date of Birth: 09-Dec-1932 Attending MD: Barney Drain , MD CSN: [id number] Age: 82 Admit Type: Outpatient Procedure:                Upper GI endoscopy with cold forceps biopsy Indications:              Anemia ON ASA WITHOUT A PPI. Providers:                Barney Drain, MD, Janeece Riggers, RN, Randa Spike,                            Technician Referring MD:             Norwood Levo. Simpson MD, MD Medicines:                Promethazine 6.25 mg IV, Fentanyl 50 micrograms IV,                            Midazolam 5 mg IV Complications:            No immediate complications. Estimated Blood Loss:     Estimated blood loss was minimal. Procedure:                Pre-Anesthesia Assessment:                           - Prior to the procedure, a History and Physical                            was performed, and patient medications and                            allergies were reviewed. The patient's tolerance of                            previous anesthesia was also reviewed. The risks                            and benefits of the procedure and the sedation                            options and risks were discussed with the patient.                            All questions were answered, and informed consent                            was obtained. Prior Anticoagulants: The patient has                            taken aspirin, last dose was day of procedure. ASA                            Grade Assessment: II - A patient with mild systemic  disease. After reviewing the risks and benefits,                            the patient was deemed in satisfactory condition to                            undergo the procedure. After obtaining informed                            consent, the endoscope was passed under direct                            vision. Throughout the procedure, the  patient's                            blood pressure, pulse, and oxygen saturations were                            monitored continuously. The EG-[id number] ([id number])                            scope was introduced through the mouth, and                            advanced to the second part of duodenum. The upper                            GI endoscopy was accomplished without difficulty.                            The patient tolerated the procedure fairly well. Scope In: 9:38:18 AM Scope Out: 9:13:59 AM Total Procedure Duration: 0 hours 7 minutes 45 seconds  Findings:      A widely patent Schatzki ring (acquired) was found at the       gastroesophageal junction.      Patchy moderate inflammation characterized by congestion (edema),       erosions and erythema was found in the gastric antrum. Biopsies were       taken with a cold forceps for Helicobacter pylori testing. PSEUDOPYLORUS       CONSISTENT WITH PRIOR PUD.      Patchy mild inflammation characterized by congestion (edema) and       erythema was found in the duodenal bulb.      The second portion of the duodenum was normal. Impression:               - Widely patent Schatzki ring.                           - ANEMIA DUE TO ASA INDUCED EROSIVE Gastritis.                            Biopsied.                           - Mild Duodenitis. Moderate Sedation:  Moderate (conscious) sedation was administered by the endoscopy nurse       and supervised by the endoscopist. The following parameters were       monitored: oxygen saturation, heart rate, blood pressure, and response       to care. Total physician intraservice time was 22 minutes. Recommendation:           - Resume previous diet.                           - No aspirin, ibuprofen, naproxen, or other                            non-steroidal anti-inflammatory drugs for 2 weeks                            after biopsy.                           - Use Protonix (pantoprazole) 40 mg  PO daily.                           - Await pathology results.                           - Return to my office in 3 months. CBC 1 WEEK PRIOR                            TO OPV.                           - Patient has a contact number available for                            emergencies. The signs and symptoms of potential                            delayed complications were discussed with the                            patient. Return to normal activities tomorrow.                            Written discharge instructions were provided to the                            patient. Procedure Code(s):        --- Professional ---                           [phone], Esophagogastroduodenoscopy, flexible,                            transoral; with biopsy, single or multiple                           99152, Moderate sedation services provided by the  same physician or other qualified health care                            professional performing the diagnostic or                            therapeutic service that the sedation supports,                            requiring the presence of an independent trained                            observer to assist in the monitoring of the                            patient's level of consciousness and physiological                            status; initial 15 minutes of intraservice time,                            patient age 49 years or older Diagnosis Code(s):        --- Professional ---                           K22.2, Esophageal obstruction                           K29.70, Gastritis, unspecified, without bleeding                           K29.80, Duodenitis without bleeding                           D64.9, Anemia, unspecified CPT copyright 2016 American Medical Association. All rights reserved. The codes documented in this report are preliminary and upon coder review may  be revised to meet current compliance  requirements. Barney Drain, MD Barney Drain, MD 03/20/2016 9:39:19 AM This report has been signed electronically. Number of Addenda: 0

## 2016-03-20 NOTE — H&P (Signed)
Primary Care Physician:  [name], MD Primary Gastroenterologist:  Dr. Oneida Alar  Pre-Procedure History & Physical: HPI:  [name] [name] is a 82 y.o. female here for anemia/BRBPR.  Past Medical History:  Diagnosis Date  . Arthritis    spinal stenosis  . Elevated LFTs    secondary to fatty liver, negative work-up in 2011  . Hypercholesterolemia   . Hypertension   . Osteoarthritis    right knee  . PONV (postoperative nausea and vomiting)   . Uterine cancer ([place]) 08/2006   grade 1, no recurrence up to 2013    Past Surgical History:  Procedure Laterality Date  . 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 APPENDECTOMY  1973  . CHOLECYSTECTOMY  1973  . COLONOSCOPY    06/21/2007   [id number] rectum/Sigmoid diverticula, diminutive hepatic flexure polyp s/p bx. Benign.   . COLONOSCOPY N/A 10/17/2012   Dr. Oneida Alar- moderate diverticulosis was noted in the sigmoid colon, moderate sized internal hemorrhoids. next tcs in10 years  . FOOT SURGERY  2007   Pins in toes on left foot, 5 to 6 yrs ago  . TONSILLECTOMY      Prior to Admission medications   Medication Sig Start Date End Date Taking? Authorizing Provider  aspirin 81 MG tablet Take 81 mg by mouth daily.   Yes Historical Provider, MD  cholecalciferol (VITAMIN D) 1000 units tablet Take 1,000 Units by mouth daily.   Yes Historical Provider, MD  potassium chloride (KLOR-CON 10) 10 MEQ tablet Take 1 tablet (10 mEq total) by mouth daily. 01/23/16  Yes Fayrene Helper, MD  triamterene-hydrochlorothiazide (MAXZIDE) 75-50 MG tablet TAKE 1 TABLET EVERY DAY 03/04/16  Yes Fayrene Helper, MD  atorvastatin (LIPITOR) 10 MG tablet Take 1 tablet (10 mg total) by mouth daily. 11/29/14   Fayrene Helper, MD    Allergies as of 03/12/2016 - Review Complete 02/04/2016  Allergen Reaction Noted  . Ciprofloxacin hcl  06/23/2010  . Hydrocodone Nausea Only 01/19/2012  . Penicillins  09/08/2007    Family History  Problem  Relation Age of Onset  . Heart disease Father   . Cancer Brother     bladder  . Hypertension Sister   . Dementia Sister   . Diabetes Sister   . Diabetes type II Sister   . Hypertension Sister   . Colon cancer Neg Hx     Social History   Social History  . Marital status: Married    Spouse name: N/A  . Number of children: N/A  . Years of education: N/A   Occupational History  . Not on file.   Social History Main Topics  . Smoking status: Never Smoker  . Smokeless tobacco: Never Used     Comment: Never smoked  . Alcohol use No  . Drug use: No  . Sexual activity: Yes   Other Topics Concern  . Not on file   Social History Narrative  . No narrative on file    Review of Systems: See HPI, otherwise negative ROS   Physical Exam: BP (!) 144/65   Pulse 83   Temp 98.6 F (37 C) (Oral)   Resp 18   Ht 5' 6"  (1.676 m)   Wt 160 lb (72.6 kg)   SpO2 99%   BMI 25.82 kg/m  General:   Alert,  pleasant and cooperative in NAD Head:  Normocephalic and atraumatic. Neck:  Supple; Lungs:  Clear throughout to auscultation.    Heart:  Regular  rate and rhythm. Abdomen:  Soft, nontender and nondistended. Normal bowel sounds, without guarding, and without rebound.   Neurologic:  Alert and  oriented x4;  grossly normal neurologically.  Impression/Plan:    Anemia/BRBPR  PLAN:  1. EGD/Givens  TODAY. DISCUSSED PROCEDURE, BENEFITS, & RISKS: < 1% chance of medication reaction, PERFORATION, CAPSULE RETENTION, OR bleeding.

## 2016-03-21 ENCOUNTER — Emergency Department (HOSPITAL_COMMUNITY)
Admission: EM | Admit: 2016-03-21 | Discharge: 2016-03-21 | Disposition: A | Discharge status: Home / Self Care | Payer: Commercial Managed Care - HMO | Attending: Emergency Medicine | Admitting: Emergency Medicine

## 2016-03-21 ENCOUNTER — Emergency Department (HOSPITAL_COMMUNITY): Payer: Commercial Managed Care - HMO

## 2016-03-21 ENCOUNTER — Encounter (HOSPITAL_COMMUNITY): Payer: Self-pay | Admitting: *Deleted

## 2016-03-21 DIAGNOSIS — Z7982 Long term (current) use of aspirin: Secondary | ICD-10-CM | POA: Insufficient documentation

## 2016-03-21 DIAGNOSIS — Y929 Unspecified place or not applicable: Secondary | ICD-10-CM | POA: Diagnosis not present

## 2016-03-21 DIAGNOSIS — Y999 Unspecified external cause status: Secondary | ICD-10-CM | POA: Diagnosis not present

## 2016-03-21 DIAGNOSIS — W01198A Fall on same level from slipping, tripping and stumbling with subsequent striking against other object, initial encounter: Secondary | ICD-10-CM | POA: Diagnosis not present

## 2016-03-21 DIAGNOSIS — S8991XA Unspecified injury of right lower leg, initial encounter: Secondary | ICD-10-CM | POA: Diagnosis present

## 2016-03-21 DIAGNOSIS — I10 Essential (primary) hypertension: Secondary | ICD-10-CM | POA: Insufficient documentation

## 2016-03-21 DIAGNOSIS — M25561 Pain in right knee: Secondary | ICD-10-CM | POA: Diagnosis not present

## 2016-03-21 DIAGNOSIS — S8001XA Contusion of right knee, initial encounter: Secondary | ICD-10-CM | POA: Diagnosis not present

## 2016-03-21 DIAGNOSIS — Z79899 Other long term (current) drug therapy: Secondary | ICD-10-CM | POA: Insufficient documentation

## 2016-03-21 DIAGNOSIS — Y9389 Activity, other specified: Secondary | ICD-10-CM | POA: Insufficient documentation

## 2016-03-21 MED ORDER — NAPROXEN 375 MG PO TABS: 375.0000 mg | ORAL_TABLET | Freq: Two times a day (BID) | ORAL | 0 refills | Status: DC

## 2016-03-21 NOTE — Discharge Instructions (Signed)
Apply ice packs on/off to your knee.  Wear the ACE wrap as needed but not continuously.  Follow-up with Dr. Ruthe Mannan office in a few days if the pain is not improving

## 2016-03-21 NOTE — ED Provider Notes (Signed)
[name] DEPT Provider Note   CSN: [id number] Arrival date & time: 03/21/16  1244     History   Chief Complaint Chief Complaint  Patient presents with  . Knee Pain    HPI [name] [name] is a 82 y.o. female.  HPI  [name] [name] is a 82 y.o. female who presents to the Emergency Department complaining of knee pain for one day.  She states that she tripped and fell landing on her right knee.  She continues to bear weight on the knee, but describes pain with bending.  She denies other injuries, swelling, redness, numbness or open wounds.  She has not tried any therapies.  Past Medical History:  Diagnosis Date  . Arthritis    spinal stenosis  . Elevated LFTs    secondary to fatty liver, negative work-up in 2011  . Hypercholesterolemia   . Hypertension   . Osteoarthritis    right knee  . PONV (postoperative nausea and vomiting)   . Uterine cancer ([place]) 08/2006   grade 1, no recurrence up to 2013    Patient Active Problem List   Diagnosis Date Noted  . Anemia   . Rectal bleeding 02/04/2016  . Acute blood loss anemia 02/04/2016  . Annual physical exam 11/29/2014  . 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 Urinary incontinence 06/20/2012  . Vitamin D deficiency 08/22/2011  . Prediabetes 05/29/2011  . NASH (nonalcoholic steatohepatitis) 11/05/2009  . Arthritis of right knee 09/04/2009  . Hyperlipidemia LDL goal <100 09/08/2007  . Essential hypertension 09/08/2007    Past Surgical History:  Procedure Laterality Date  . 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 APPENDECTOMY  1973  . CHOLECYSTECTOMY  1973  . COLONOSCOPY    06/21/2007   XVQ:MGQQPY rectum/Sigmoid diverticula, diminutive hepatic flexure polyp s/p bx. Benign.   . COLONOSCOPY N/A 10/17/2012   Dr. Oneida Alar- moderate diverticulosis was noted in the sigmoid colon, moderate sized internal hemorrhoids. next tcs in10 years  . FOOT SURGERY  2007   Pins in  toes on left foot, 5 to 6 yrs ago  . TONSILLECTOMY      OB History    No data available       Home Medications    Prior to Admission medications   Medication Sig Start Date End Date Taking? Authorizing Provider  aspirin 81 MG tablet Take 81 mg by mouth daily.    Historical Provider, MD  atorvastatin (LIPITOR) 10 MG tablet Take 1 tablet (10 mg total) by mouth daily. 11/29/14   Fayrene Helper, MD  cholecalciferol (VITAMIN D) 1000 units tablet Take 1,000 Units by mouth daily.    Historical Provider, MD  pantoprazole (PROTONIX) 40 MG tablet 1 po 30 mins prior to first meal 03/20/16   Danie Binder, MD  potassium chloride (KLOR-CON 10) 10 MEQ tablet Take 1 tablet (10 mEq total) by mouth daily. 01/23/16   Fayrene Helper, MD  triamterene-hydrochlorothiazide [facility]) 75-50 MG tablet TAKE 1 TABLET EVERY DAY 03/04/16   Fayrene Helper, MD    Family History Family History  Problem Relation Age of Onset  . Heart disease Father   . Cancer Brother     bladder  . Hypertension Sister   . Dementia Sister   . Diabetes Sister   . Diabetes type II Sister   . Hypertension Sister   . Colon cancer Neg Hx     Social History Social History  Substance Use Topics  . Smoking status: Never Smoker  . Smokeless tobacco: Never Used     Comment: Never smoked  . Alcohol use No     Allergies   Ciprofloxacin hcl; Hydrocodone; and Penicillins   Review of Systems Review of Systems  Constitutional: Negative for chills and fever.  Genitourinary: Negative for difficulty urinating and dysuria.  Musculoskeletal: Positive for arthralgias (right knee pain). Negative for joint swelling, myalgias, neck pain and neck stiffness.  Skin: Negative for color change and wound.  Neurological: Negative for dizziness, weakness, numbness and headaches.  All other systems reviewed and are negative.    Physical Exam Updated Vital Signs BP 133/61 (BP Location: Right Arm)   Pulse 80   Temp 97.3 F (36.3  C) (Tympanic)   Resp 16   Wt 72.6 kg   SpO2 96%   BMI 25.82 kg/m   Physical Exam  Constitutional: She is oriented to person, place, and time. She appears well-developed and well-nourished. No distress.  Neck: Normal range of motion.  Cardiovascular: Normal rate, regular rhythm and intact distal pulses.   Pulmonary/Chest: Effort normal and breath sounds normal.  Musculoskeletal: She exhibits tenderness. She exhibits no edema.  ttp of the anterior right  knee.  No erythema, effusion, or step-off deformity.  DP pulse brisk, distal sensation intact. Calf is soft and NT.  Neurological: She is alert and oriented to person, place, and time. She exhibits normal muscle tone. Coordination normal.  Skin: Skin is warm and dry. No erythema.  Nursing note and vitals reviewed.    ED Treatments / Results  Labs (all labs ordered are listed, but only abnormal results are displayed) Labs Reviewed - No data to display  EKG  EKG Interpretation None       Radiology Dg Knee Complete 4 Views Right  Result Date: 03/21/2016 CLINICAL DATA:  Status post fall with right knee pain. EXAM: RIGHT KNEE - COMPLETE 4+ VIEW COMPARISON:  January 13, 2016 FINDINGS: No evidence of fracture, dislocation, or joint effusion. There is chondrocalcinosis in the lateral femoral tibial joint space unchanged. There is decreased femoral tibial joint space. Soft tissues are unremarkable. IMPRESSION: No acute fracture or dislocation. Arthritic changes of the right knee stable compared prior exam appear Electronically Signed   By: Abelardo Diesel M.D.   On: 03/21/2016 14:26    Procedures Procedures (including critical care time)  Medications Ordered in ED Medications - No data to display   Initial Impression / Assessment and Plan / ED Course  I have reviewed the triage vital signs and the nursing notes.  Pertinent labs & imaging results that were available during my care of the patient were reviewed by me and considered  in my medical decision making (see chart for details).  Clinical Course     Pt seen by Dr. Laverta Baltimore and care plan discussed.  XR's reassuring.  Likely sprain. No concerning sx's for septic joint. Pt has own walker. Ambulates with a steady gait.  agrees to ice, NSAID and ortho f/u   Final Clinical Impressions(s) / ED Diagnoses   Final diagnoses:  Contusion of right knee, initial encounter    New Prescriptions New Prescriptions   No medications on file     Bufford Lope 03/25/16 2136    Margette Fast, MD 03/26/16 [id number]

## 2016-03-21 NOTE — ED Triage Notes (Signed)
Pt had an upper endo yesterday with sedation and when she was discharged she was still a bit "wobbly" and while being helped to the car she fell and struck her right knee on the ground.  She continues to have right knee pain which is increased with ambulation.  Pt is ambulatory (has borrowed a walker from a family member).  Pt went home after the fall yesterday but due to pain wanted to come back to have this evaluated

## 2016-03-21 NOTE — ED Notes (Signed)
Patient transported to X-ray 

## 2016-03-23 HISTORY — PX: BREAST BIOPSY: SHX20

## 2016-03-24 ENCOUNTER — Other Ambulatory Visit: Payer: Self-pay

## 2016-03-24 ENCOUNTER — Encounter (HOSPITAL_COMMUNITY): Payer: Self-pay | Admitting: Gastroenterology

## 2016-03-24 ENCOUNTER — Telehealth: Payer: Self-pay

## 2016-03-24 DIAGNOSIS — D649 Anemia, unspecified: Secondary | ICD-10-CM

## 2016-03-24 NOTE — Telephone Encounter (Signed)
CBC on file for 06/10/2016.

## 2016-03-25 NOTE — Telephone Encounter (Signed)
Please call pt. HER stomach Bx shows EROSIVE gastritis/DUODENITIS DUE TO ASA USE.    NO ASPIRIN, BC/GOODY POWDERS, IBUPROFEN/MOTRIN, OR NAPROXEN/ALEVE FOR 2 WEEKS.  START PROTONIX DAILY WITH BREAKFAST to help your stomach heal and to prevent ulcers.  FOLLOW UP IN 3 MOS WITH DR. Zarayah Lanting. YOU WILL NEED A CBC 1 WEEK PRIOR TO YOUR NEXT OUTPATIENT VISIT.

## 2016-03-25 NOTE — Telephone Encounter (Signed)
Opened in error

## 2016-03-26 NOTE — Telephone Encounter (Signed)
LMOM for a return call. CBC on file.

## 2016-03-26 NOTE — Telephone Encounter (Signed)
OV made °

## 2016-03-27 NOTE — Telephone Encounter (Signed)
Letter mailed to pt to call.

## 2016-04-02 NOTE — Telephone Encounter (Signed)
Pt called and is aware of her results and plan.

## 2016-04-13 ENCOUNTER — Telehealth: Payer: Self-pay | Admitting: Gastroenterology

## 2016-04-13 NOTE — Telephone Encounter (Signed)
LMOM for a return call. ( I had informed pt on 1/11/20180.

## 2016-04-13 NOTE — Telephone Encounter (Signed)
PT called and I went over her results again. She had just forgot.

## 2016-04-13 NOTE — Telephone Encounter (Signed)
Please call patient about biopsies from procedure, 3 weeks ago and she has not heard yet about results

## 2016-05-26 DIAGNOSIS — R7303 Prediabetes: Secondary | ICD-10-CM | POA: Diagnosis not present

## 2016-05-26 DIAGNOSIS — I10 Essential (primary) hypertension: Secondary | ICD-10-CM | POA: Diagnosis not present

## 2016-05-26 DIAGNOSIS — E559 Vitamin D deficiency, unspecified: Secondary | ICD-10-CM | POA: Diagnosis not present

## 2016-05-26 DIAGNOSIS — E785 Hyperlipidemia, unspecified: Secondary | ICD-10-CM | POA: Diagnosis not present

## 2016-05-26 DIAGNOSIS — D649 Anemia, unspecified: Secondary | ICD-10-CM | POA: Diagnosis not present

## 2016-05-26 LAB — CBC WITH DIFFERENTIAL/PLATELET
BASOS PCT: 0 %
Basophils Absolute: 0 cells/uL (ref 0–200)
Eosinophils Absolute: 86 cells/uL (ref 15–500)
Eosinophils Relative: 2 %
HEMATOCRIT: 36.4 % (ref 35.0–45.0)
Hemoglobin: 11.9 g/dL (ref 11.7–15.5)
LYMPHS ABS: 946 {cells}/uL (ref 850–3900)
Lymphocytes Relative: 22 %
MCH: 28.6 pg (ref 27.0–33.0)
MCHC: 32.7 g/dL (ref 32.0–36.0)
MCV: 87.5 fL (ref 80.0–100.0)
MONO ABS: 387 {cells}/uL (ref 200–950)
MPV: 11.6 fL (ref 7.5–12.5)
Monocytes Relative: 9 %
Neutro Abs: 2881 cells/uL (ref 1500–7800)
Neutrophils Relative %: 67 %
Platelets: 176 10*3/uL (ref 140–400)
RBC: 4.16 MIL/uL (ref 3.80–5.10)
RDW: 13.7 % (ref 11.0–15.0)
WBC: 4.3 10*3/uL (ref 3.8–10.8)

## 2016-05-26 LAB — COMPREHENSIVE METABOLIC PANEL
ALBUMIN: 3.7 g/dL (ref 3.6–5.1)
ALK PHOS: 48 U/L (ref 33–130)
ALT: 42 U/L — AB (ref 6–29)
AST: 58 U/L — AB (ref 10–35)
BILIRUBIN TOTAL: 0.6 mg/dL (ref 0.2–1.2)
BUN: 24 mg/dL (ref 7–25)
CALCIUM: 9.7 mg/dL (ref 8.6–10.4)
CO2: 27 mmol/L (ref 20–31)
Chloride: 107 mmol/L (ref 98–110)
Creat: 0.81 mg/dL (ref 0.60–0.88)
Glucose, Bld: 94 mg/dL (ref 65–99)
Potassium: 4.3 mmol/L (ref 3.5–5.3)
Sodium: 142 mmol/L (ref 135–146)
Total Protein: 6.4 g/dL (ref 6.1–8.1)

## 2016-05-26 LAB — LIPID PANEL
CHOL/HDL RATIO: 1.9 ratio (ref <5.0)
Cholesterol: 183 mg/dL (ref <200)
HDL: 95 mg/dL (ref >50)
LDL Cholesterol: 77 mg/dL (ref <100)
Triglycerides: 57 mg/dL (ref <150)
VLDL: 11 mg/dL (ref <30)

## 2016-05-27 ENCOUNTER — Telehealth: Payer: Self-pay | Admitting: Gastroenterology

## 2016-05-27 LAB — HEMOGLOBIN A1C
Hgb A1c MFr Bld: 5.9 % — ABNORMAL HIGH (ref <5.7)
MEAN PLASMA GLUCOSE: 123 mg/dL

## 2016-05-27 LAB — VITAMIN D 25 HYDROXY (VIT D DEFICIENCY, FRACTURES): VIT D 25 HYDROXY: 34 ng/mL (ref 30–100)

## 2016-05-27 NOTE — Telephone Encounter (Signed)
Pt is aware of results. 

## 2016-05-27 NOTE — Telephone Encounter (Addendum)
PLEASE CALL PT. HER BLOOD COUNT & VITAMIN D LEVELS ARE NORMAL. HER LIVER ENZYMES ARE ELEVATED BUT UNCHANGED. HER LIPID PANEL LOOKS GREAT. HER TOTAL CHOLESTEROL IS DOWN TO 183. THE HDL(GOOD CHOLESTEROL) IS UP TO 95 AND THE LDL(BAD CHOLESTEROL) IS DOWN TO 77.

## 2016-05-27 NOTE — Telephone Encounter (Signed)
LMOM to call.

## 2016-06-01 ENCOUNTER — Encounter: Payer: Self-pay | Admitting: Family Medicine

## 2016-06-01 ENCOUNTER — Ambulatory Visit (INDEPENDENT_AMBULATORY_CARE_PROVIDER_SITE_OTHER): Payer: Medicare HMO | Admitting: Family Medicine

## 2016-06-01 VITALS — BP 130/70 | HR 98 | Resp 15 | Ht 66.0 in | Wt 164.8 lb

## 2016-06-01 DIAGNOSIS — F439 Reaction to severe stress, unspecified: Secondary | ICD-10-CM

## 2016-06-01 DIAGNOSIS — I10 Essential (primary) hypertension: Secondary | ICD-10-CM | POA: Diagnosis not present

## 2016-06-01 DIAGNOSIS — E785 Hyperlipidemia, unspecified: Secondary | ICD-10-CM

## 2016-06-01 DIAGNOSIS — R7303 Prediabetes: Secondary | ICD-10-CM

## 2016-06-01 DIAGNOSIS — M1711 Unilateral primary osteoarthritis, right knee: Secondary | ICD-10-CM

## 2016-06-01 NOTE — Assessment & Plan Note (Signed)
Stable and doing welll Patient educated about the importance of limiting  Carbohydrate intake , the need to commit to daily physical activity for a minimum of 30 minutes , and to commit weight loss. The fact that changes in all these areas will reduce or eliminate all together the development of diabetes is stressed.   Diabetic Labs Latest Ref Rng & Units 05/26/2016 01/20/2016 08/14/2015 03/20/2015 07/11/2014  HbA1c <5.7 % 5.9(H) 5.7(H) 6.0(H) 6.0(H) 5.9(H)  Chol <200 mg/dL 183 190 217(H) 209(H) 206(H)  HDL >50 mg/dL 95 84 101 105 84  Calc LDL <100 mg/dL 77 92 100 89 103(H)  Triglycerides <150 mg/dL 57 72 79 77 94  Creatinine 0.60 - 0.88 mg/dL 0.81 0.87 0.72 0.66 0.87   BP/Weight 06/01/2016 03/21/2016 03/20/2016 02/04/2016 01/31/2016 01/13/2016 06/18/9240  Systolic BP 683 419 622 297 989 211 941  Diastolic BP 70 61 62 75 64 78 78  Wt. (Lbs) 164.8 160 160 160.4 160 163 164  BMI 26.6 25.82 25.82 25.89 25.82 26.31 26.47   No flowsheet data found.

## 2016-06-01 NOTE — Assessment & Plan Note (Signed)
Controlled, no change in medication Hyperlipidemia:Low fat diet discussed and encouraged.   Lipid Panel  Lab Results  Component Value Date   CHOL 183 05/26/2016   HDL 95 05/26/2016   LDLCALC 77 05/26/2016   TRIG 57 05/26/2016   CHOLHDL 1.9 05/26/2016

## 2016-06-01 NOTE — Progress Notes (Signed)
[name] [name]     MRN: [id number]      DOB: 06-May-1932   HPI [name] [name] is here for follow up and re-evaluation of chronic medical conditions, medication management and review of any available recent lab and radiology data.  Preventive health is updated, specifically  Cancer screening and Immunization.   C/o increased right knee instability, and it has actually given out on her at times, needs ortho re evaluation C/o increased anxiety and stress due to spouse's failing health , and her inability to do more than she is currently doing, has no local help. The PT denies any adverse reactions to current medications since the last visit.     ROS Denies recent fever or chills. Denies sinus pressure, nasal congestion, ear pain or sore throat. Denies chest congestion, productive cough or wheezing. Denies chest pains, palpitations and leg swelling Denies abdominal pain, nausea, vomiting,diarrhea or constipation.   Denies dysuria, frequency, hesitancy or incontinenDenies headaches, seizures, numbness, or tingling. . Denies skin break down or rash.   PE  BP 130/70   Pulse 98   Resp 15   Ht 5' 6"  (1.676 m)   Wt 164 lb 12.8 oz (74.8 kg)   SpO2 98%   BMI 26.60 kg/m   Patient alert and oriented and in no cardiopulmonary distress.  HEENT: No facial asymmetry, EOMI,   oropharynx pink and moist.  Neck supple no JVD, no mass.  Chest: Clear to auscultation bilaterally.  CVS: S1, S2 no murmurs, no S3.Regular rate.  ABD: Soft non tender.   Ext: No edema  MS: Adequate though reduced  ROM spine,  and right knee which is deformed, swollen and has crepitus.  Skin: Intact, no ulcerations or rash noted.  Psych: Good eye contact, normal affect. Memory intact not anxious or depressed appearing.  CNS: CN 2-12 intact, power,  normal throughout.no focal deficits noted.   Assessment & Plan  Essential hypertension Controlled, no change in medication DASH diet and commitment to daily physical  activity for a minimum of 30 minutes discussed and encouraged, as a part of hypertension management. The importance of attaining a healthy weight is also discussed.  BP/Weight 06/01/2016 03/21/2016 03/20/2016 02/04/2016 01/31/2016 01/13/2016 04/26/5595  Systolic BP 416 384 536 468 032 122 482  Diastolic BP 70 61 62 75 64 78 78  Wt. (Lbs) 164.8 160 160 160.4 160 163 164  BMI 26.6 25.82 25.82 25.89 25.82 26.31 26.47       Arthritis of right knee Deterioration with recurrent near falls, increased instability, ortho to re eval  Prediabetes Stable and doing welll Patient educated about the importance of limiting  Carbohydrate intake , the need to commit to daily physical activity for a minimum of 30 minutes , and to commit weight loss. The fact that changes in all these areas will reduce or eliminate all together the development of diabetes is stressed.   Diabetic Labs Latest Ref Rng & Units 05/26/2016 01/20/2016 08/14/2015 03/20/2015 07/11/2014  HbA1c <5.7 % 5.9(H) 5.7(H) 6.0(H) 6.0(H) 5.9(H)  Chol <200 mg/dL 183 190 217(H) 209(H) 206(H)  HDL >50 mg/dL 95 84 101 105 84  Calc LDL <100 mg/dL 77 92 100 89 103(H)  Triglycerides <150 mg/dL 57 72 79 77 94  Creatinine 0.60 - 0.88 mg/dL 0.81 0.87 0.72 0.66 0.87   BP/Weight 06/01/2016 03/21/2016 03/20/2016 02/04/2016 01/31/2016 01/13/2016 5/00/3704  Systolic BP 888 916 945 038 882 800 349  Diastolic BP 70 61 62 75 64 78 78  Wt. (  Lbs) 164.8 160 160 160.4 160 163 164  BMI 26.6 25.82 25.82 25.89 25.82 26.31 26.47   No flowsheet data found.    Hyperlipidemia LDL goal <100 Controlled, no change in medication Hyperlipidemia:Low fat diet discussed and encouraged.   Lipid Panel  Lab Results  Component Value Date   CHOL 183 05/26/2016   HDL 95 05/26/2016   LDLCALC 77 05/26/2016   TRIG 57 05/26/2016   CHOLHDL 1.9 05/26/2016       Stress at home Stress due to failing and poor health of her spouse. Encouraged increased community involvement  on her part as she is able, and to try to continue to the best that she is able to

## 2016-06-01 NOTE — Assessment & Plan Note (Signed)
Controlled, no change in medication DASH diet and commitment to daily physical activity for a minimum of 30 minutes discussed and encouraged, as a part of hypertension management. The importance of attaining a healthy weight is also discussed.  BP/Weight 06/01/2016 03/21/2016 03/20/2016 02/04/2016 01/31/2016 01/13/2016 7/89/7847  Systolic BP 841 282 081 388 719 597 471  Diastolic BP 70 61 62 75 64 78 78  Wt. (Lbs) 164.8 160 160 160.4 160 163 164  BMI 26.6 25.82 25.82 25.89 25.82 26.31 26.47

## 2016-06-01 NOTE — Assessment & Plan Note (Signed)
Stress due to failing and poor health of her spouse. Encouraged increased community involvement on her part as she is able, and to try to continue to the best that she is able to

## 2016-06-01 NOTE — Assessment & Plan Note (Signed)
Deterioration with recurrent near falls, increased instability, ortho to re eval

## 2016-06-01 NOTE — Patient Instructions (Addendum)
Wellness visit in 4 month with [name] [name], call if you need me sooner   Labs are excellent   Please work on home safety   We will schedule your mammogram and let you   Know  You are referred to [name] re right knee

## 2016-06-12 ENCOUNTER — Other Ambulatory Visit: Payer: Self-pay | Admitting: Family Medicine

## 2016-06-15 ENCOUNTER — Other Ambulatory Visit: Payer: Self-pay | Admitting: Family Medicine

## 2016-06-15 DIAGNOSIS — R928 Other abnormal and inconclusive findings on diagnostic imaging of breast: Secondary | ICD-10-CM

## 2016-06-17 ENCOUNTER — Ambulatory Visit: Payer: Commercial Managed Care - HMO | Admitting: Gastroenterology

## 2016-06-17 ENCOUNTER — Encounter: Payer: Self-pay | Admitting: Gastroenterology

## 2016-06-17 ENCOUNTER — Ambulatory Visit (INDEPENDENT_AMBULATORY_CARE_PROVIDER_SITE_OTHER): Payer: Medicare HMO | Admitting: Gastroenterology

## 2016-06-17 DIAGNOSIS — K7581 Nonalcoholic steatohepatitis (NASH): Secondary | ICD-10-CM

## 2016-06-17 DIAGNOSIS — K625 Hemorrhage of anus and rectum: Secondary | ICD-10-CM

## 2016-06-17 DIAGNOSIS — K219 Gastro-esophageal reflux disease without esophagitis: Secondary | ICD-10-CM

## 2016-06-17 NOTE — Patient Instructions (Signed)
DRINK WATER TO KEEP YOUR URINE LIGHT YELLOW.  FOLLOW A HIGH FIBER/LOW FAT DIET.  MEATS SHOULD BE CHOPPED OR GROUND ONLY. AVOID ITEMS THAT CAUSE BLOATING & GAS.  CONTINUE PROTONIX. TAKE 30 MINUTES PRIOR TO BREAKFAST. PLEASE CALL IF YOU WANT TO TRY ANOTHER DRUG TO CONTROL HEARTBURN AND AVOID GI UPSET/RUNNY NOSE.  FOLLOW UP IN 6 MOS.

## 2016-06-17 NOTE — Assessment & Plan Note (Signed)
WEIGHT/LIVER PANEL STABLE.  CONTINUE TO MONITOR SYMPTOMS. LOW FAT DIET FOLLOW UP IN 6 MOS.

## 2016-06-17 NOTE — Progress Notes (Signed)
cc'ed to pcp °

## 2016-06-17 NOTE — Progress Notes (Signed)
Subjective:    Patient ID: [name] [name], female    DOB: 1933/02/03, 83 y.o.   MRN: [id number]  [name], MD  HPI No questions or concerns. Occasional bubbly guts and runny nose WHEN SHE TAKES PANTOPRAZOLE. CONSTIPATION: BMs ONLY IF TAKE SOMETHING (OTC MED). OCCASIONAL GAS PAIN. NO HEARTBURN. SWELLING IN ANKLES AND LEFT ARM WENT LIMP. FELL AFTER ENDOSCOPY BUT SHE DIDN'T HIT THE GROUND. NURSES FELT LIKE SHE DIDN'T HIT THE GROUND. REFERRED TO DR. HARRISON-GIVEN A BRACE. CAN'T KEEP BRACE ON. HAS FOLLOW UP WITH HIM NEXT MO.  PT DENIES FEVER, CHILLS, HEMATOCHEZIA, HEMATEMESIS, nausea, vomiting, melena, diarrhea, CHEST PAIN, SHORTNESS OF BREATH, CHANGE IN BOWEL IN HABITS, problems swallowing, OR heartburn or indigestion.  Past Medical History:  Diagnosis Date  . Arthritis    spinal stenosis  . Elevated LFTs    secondary to fatty liver, negative work-up in 2011  . Hypercholesterolemia   . Hypertension   . Osteoarthritis    right knee  . PONV (postoperative nausea and vomiting)   . Uterine cancer ([place]) 08/2006   grade 1, no recurrence up to 2013   Past Surgical History:  Procedure Laterality Date  . 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 APPENDECTOMY  1973  . CHOLECYSTECTOMY  1973  . COLONOSCOPY    06/21/2007   JQB:HALPFX rectum/Sigmoid diverticula, diminutive hepatic flexure polyp s/p bx. Benign.   . COLONOSCOPY N/A 10/17/2012   Dr. Oneida Alar- moderate diverticulosis was noted in the sigmoid colon, moderate sized internal hemorrhoids. next tcs in10 years  . ESOPHAGOGASTRODUODENOSCOPY N/A 03/20/2016   Procedure: ESOPHAGOGASTRODUODENOSCOPY (EGD);  Surgeon: Danie Binder, MD;  Location: [facility];  Service: Endoscopy;  Laterality: N/A;  830  . FOOT SURGERY  2007   Pins in toes on left foot, 5 to 6 yrs ago  . GIVENS CAPSULE STUDY N/A 03/20/2016   Procedure: GIVENS CAPSULE STUDY;  Surgeon: Danie Binder, MD;  Location: [facility] SU[facility];  Service: Endoscopy;   Laterality: N/A;  . TONSILLECTOMY     Allergies  Allergen Reactions  . Ciprofloxacin Hcl     Chills, sick, could not tolerate it  . Hydrocodone Nausea Only  . Penicillins     Current Outpatient Prescriptions  Medication Sig Dispense Refill  . aspirin 81 MG tablet Take 81 mg by mouth as needed.     Marland Kitchen LIPITOR 10 MG tablet Take 1 tablet (10 mg total) by mouth daily.     . cholecalciferol (VITAMIN D) 1000 units tablet Take 1,000 Units by mouth daily.    . Chromium-Cinnamon [phone] MCG-MG CAPS Take 2 tablets by mouth daily.    . Misc Natural Products (OSTEO BI-FLEX JOINT SHIELD) TABS Take 1 tablet by mouth daily.    . pantoprazole (PROTONIX) 40 MG tablet 1 po 30 mins prior to first meal    . potassium chloride (K-DUR) 10 MEQ tablet TAKE 1 TABLET EVERY DAY    . triamterene-hydrochlorothiazide (MAXZIDE) 75-50 MG tablet TAKE 1 TABLET EVERY DAY     Review of Systems PER HPI OTHERWISE ALL SYSTEMS ARE NEGATIVE.    Objective:   Physical Exam  Constitutional: She is oriented to person, place, and time. She appears well-developed and well-nourished. No distress.  HENT:  Head: Normocephalic and atraumatic.  Mouth/Throat: Oropharynx is clear and moist. No oropharyngeal exudate.  Eyes: Pupils are equal, round, and reactive to light. No scleral icterus.  Neck: Normal range of motion. Neck supple.  Cardiovascular: Normal rate, regular  rhythm and normal heart sounds.   Pulmonary/Chest: Effort normal and breath sounds normal. No respiratory distress.  Abdominal: Soft. Bowel sounds are normal. She exhibits no distension. There is no tenderness.  Musculoskeletal: She exhibits edema (bilateral lower extremities).  WALKS ASSISTED WITH A CANE.  Lymphadenopathy:    She has no cervical adenopathy.  Neurological: She is alert and oriented to person, place, and time.  NO  NEW FOCAL DEFICITS  Psychiatric: She has a normal mood and affect.  Vitals reviewed.     Assessment & Plan:

## 2016-06-17 NOTE — Assessment & Plan Note (Signed)
SYMPTOMS CONTROLLED/RESOLVED.  CONTINUE PROTONIX. TAKE 30 MINUTES PRIOR TO BREAKFAST. Low fat diet FOLLOW UP IN 6 MOS.

## 2016-06-17 NOTE — Assessment & Plan Note (Signed)
SYMPTOMS CONTROLLED/RESOLVED.  CONTINUE TO MONITOR SYMPTOMS. 

## 2016-06-17 NOTE — Progress Notes (Signed)
ON RECALL  °

## 2016-06-29 ENCOUNTER — Other Ambulatory Visit: Payer: Self-pay | Admitting: Family Medicine

## 2016-06-29 DIAGNOSIS — R928 Other abnormal and inconclusive findings on diagnostic imaging of breast: Secondary | ICD-10-CM

## 2016-07-06 ENCOUNTER — Ambulatory Visit (INDEPENDENT_AMBULATORY_CARE_PROVIDER_SITE_OTHER): Payer: Medicare HMO | Admitting: Orthopedic Surgery

## 2016-07-06 DIAGNOSIS — G8929 Other chronic pain: Secondary | ICD-10-CM | POA: Diagnosis not present

## 2016-07-06 DIAGNOSIS — M25561 Pain in right knee: Secondary | ICD-10-CM | POA: Diagnosis not present

## 2016-07-06 DIAGNOSIS — M1711 Unilateral primary osteoarthritis, right knee: Secondary | ICD-10-CM

## 2016-07-06 NOTE — Progress Notes (Signed)
Patient presents with right knee pain  She wore a brace on the right knee didn't fit well she does use a cane she had an injection last time it did well she had an upper GI showed arthritic gastritis so she's not a candidate for arthritis and medication and she does not want a knee replacement  She still having right knee pain which began after her upper GI in her knee collapsed at as a result of being lethargic after anesthesia she said her pain increased after that   Review of systems occasional catching locking right knee   On exam   we find A Well-Developed Well-Nourished Female Oriented 3 Mood and Affect Normal Appearance Well-Groomed She Is Ambulatory with a Cane She Has Tenderness in the Medial Joint Line No Effusion Some Limitation in Flexion and Extension of the Right Knee but the Knee Feels Stable  Impression Osteoarthritis Exacerbation after Trauma  Plan Inject Right Knee  Procedure note right knee injection verbal consent was obtained to inject right knee joint  Timeout was completed to confirm the site of injection  The medications used were 40 mg of Depo-Medrol and 1% lidocaine 3 cc  Anesthesia was provided by ethyl chloride and the skin was prepped with alcohol.  After cleaning the skin with alcohol a 20-gauge needle was used to inject the right knee joint. There were no complications. A sterile bandage was applied.

## 2016-07-06 NOTE — Patient Instructions (Signed)

## 2016-07-07 ENCOUNTER — Ambulatory Visit (HOSPITAL_COMMUNITY)
Admission: RE | Admit: 2016-07-07 | Discharge: 2016-07-07 | Disposition: A | Discharge status: Home / Self Care | Payer: Medicare HMO | Source: Ambulatory Visit | Attending: Family Medicine | Admitting: Family Medicine

## 2016-07-07 ENCOUNTER — Encounter (HOSPITAL_COMMUNITY): Payer: Medicare HMO

## 2016-07-07 ENCOUNTER — Other Ambulatory Visit: Payer: Self-pay | Admitting: Family Medicine

## 2016-07-07 DIAGNOSIS — R928 Other abnormal and inconclusive findings on diagnostic imaging of breast: Secondary | ICD-10-CM | POA: Insufficient documentation

## 2016-07-07 DIAGNOSIS — R921 Mammographic calcification found on diagnostic imaging of breast: Secondary | ICD-10-CM | POA: Diagnosis not present

## 2016-07-13 ENCOUNTER — Ambulatory Visit
Admission: RE | Admit: 2016-07-13 | Discharge: 2016-07-13 | Disposition: A | Discharge status: Home / Self Care | Payer: Medicare HMO | Source: Ambulatory Visit | Attending: Family Medicine | Admitting: Family Medicine

## 2016-07-13 DIAGNOSIS — R921 Mammographic calcification found on diagnostic imaging of breast: Secondary | ICD-10-CM

## 2016-07-13 DIAGNOSIS — N6489 Other specified disorders of breast: Secondary | ICD-10-CM | POA: Diagnosis not present

## 2016-07-22 ENCOUNTER — Other Ambulatory Visit: Payer: Self-pay | Admitting: Family Medicine

## 2016-08-31 ENCOUNTER — Telehealth: Payer: Self-pay | Admitting: Family Medicine

## 2016-08-31 ENCOUNTER — Other Ambulatory Visit: Payer: Self-pay

## 2016-08-31 DIAGNOSIS — E785 Hyperlipidemia, unspecified: Secondary | ICD-10-CM

## 2016-08-31 MED ORDER — ATORVASTATIN CALCIUM 10 MG PO TABS: 10.0000 mg | ORAL_TABLET | Freq: Every day | ORAL | 1 refills | Status: DC

## 2016-08-31 NOTE — Telephone Encounter (Signed)
Patient left message on nurse line 11:17 today  to request  Rx atorvastatin.  She uses [facility] .

## 2016-08-31 NOTE — Telephone Encounter (Signed)
Refill sent.

## 2016-09-28 ENCOUNTER — Ambulatory Visit (INDEPENDENT_AMBULATORY_CARE_PROVIDER_SITE_OTHER): Payer: Medicare HMO | Admitting: Family Medicine

## 2016-09-28 ENCOUNTER — Ambulatory Visit (INDEPENDENT_AMBULATORY_CARE_PROVIDER_SITE_OTHER): Payer: Medicare HMO

## 2016-09-28 ENCOUNTER — Encounter: Payer: Self-pay | Admitting: Family Medicine

## 2016-09-28 VITALS — BP 120/80 | HR 88 | Resp 16 | Ht 66.0 in | Wt 158.0 lb

## 2016-09-28 VITALS — BP 136/80 | HR 88 | Temp 98.7°F | Ht 66.0 in | Wt 158.1 lb

## 2016-09-28 DIAGNOSIS — R7303 Prediabetes: Secondary | ICD-10-CM | POA: Diagnosis not present

## 2016-09-28 DIAGNOSIS — N39498 Other specified urinary incontinence: Secondary | ICD-10-CM | POA: Diagnosis not present

## 2016-09-28 DIAGNOSIS — M5387 Other specified dorsopathies, lumbosacral region: Secondary | ICD-10-CM

## 2016-09-28 DIAGNOSIS — M539 Dorsopathy, unspecified: Secondary | ICD-10-CM

## 2016-09-28 DIAGNOSIS — E785 Hyperlipidemia, unspecified: Secondary | ICD-10-CM

## 2016-09-28 DIAGNOSIS — D508 Other iron deficiency anemias: Secondary | ICD-10-CM | POA: Diagnosis not present

## 2016-09-28 DIAGNOSIS — F439 Reaction to severe stress, unspecified: Secondary | ICD-10-CM | POA: Diagnosis not present

## 2016-09-28 DIAGNOSIS — I10 Essential (primary) hypertension: Secondary | ICD-10-CM

## 2016-09-28 DIAGNOSIS — K7581 Nonalcoholic steatohepatitis (NASH): Secondary | ICD-10-CM | POA: Diagnosis not present

## 2016-09-28 DIAGNOSIS — E559 Vitamin D deficiency, unspecified: Secondary | ICD-10-CM

## 2016-09-28 DIAGNOSIS — M1711 Unilateral primary osteoarthritis, right knee: Secondary | ICD-10-CM | POA: Diagnosis not present

## 2016-09-28 DIAGNOSIS — Z0000 Encounter for general adult medical examination without abnormal findings: Secondary | ICD-10-CM

## 2016-09-28 NOTE — Assessment & Plan Note (Signed)
Uncontrolled with medication , uses depends outside of her home

## 2016-09-28 NOTE — Assessment & Plan Note (Signed)
Chronic and unchanged, no current flare

## 2016-09-28 NOTE — Assessment & Plan Note (Signed)
Patient educated about the importance of limiting  Carbohydrate intake , the need to commit to daily physical activity for a minimum of 30 minutes , and to commit weight loss. The fact that changes in all these areas will reduce or eliminate all together the development of diabetes is stressed.   Diabetic Labs Latest Ref Rng & Units 05/26/2016 01/20/2016 08/14/2015 03/20/2015 07/11/2014  HbA1c <5.7 % 5.9(H) 5.7(H) 6.0(H) 6.0(H) 5.9(H)  Chol <200 mg/dL 183 190 217(H) 209(H) 206(H)  HDL >50 mg/dL 95 84 101 105 84  Calc LDL <100 mg/dL 77 92 100 89 103(H)  Triglycerides <150 mg/dL 57 72 79 77 94  Creatinine 0.60 - 0.88 mg/dL 0.81 0.87 0.72 0.66 0.87   BP/Weight 09/28/2016 09/28/2016 06/17/2016 06/01/2016 03/21/2016 03/20/2016 92/23/0097  Systolic BP 949 971 820 990 689 340 684  Diastolic BP 80 80 74 70 61 62 75  Wt. (Lbs) 158.12 158 164.8 164.8 160 160 160.4  BMI 25.52 25.5 26.6 26.6 25.82 25.82 25.89   No flowsheet data found.  Updated lab needed at/ before next visit.

## 2016-09-28 NOTE — Progress Notes (Signed)
[name] [name]     MRN: [id number]      DOB: 1932/07/16   HPI [name] [name] is here for follow up and re-evaluation of chronic medical conditions, medication management and review of any available recent lab and radiology data.  Preventive health is updated, specifically  Cancer screening and Immunization.   Questions or concerns regarding consultations or procedures which the PT has had in the interim are  Addressed.orthopedic doc recommends surgery for knee has nil else to offer The PT denies any adverse reactions to current medications since the last visit.  Increased debility from back and knee pain , increased difficulty with ADL's, does not want motorize wheelchair at this time tho may benefit. Poor appetite and food intake, spouse very ill also and no gelp  ROS Denies recent fever or chills. Denies sinus pressure, nasal congestion, ear pain or sore throat. Denies chest congestion, productive cough or wheezing. Denies chest pains, palpitations and leg swelling Denies abdominal pain, nausea, vomiting,diarrhea or constipation.   Denies dysuria, frequency, hesitancy or incontinence.  Denies headaches, seizures, numbness, or tingling. Denies depression, anxiety or insomnia. Denies skin break down or rash.   PE  BP 120/80   Pulse 88   Resp 16   Ht 5' 6"  (1.676 m)   Wt 158 lb (71.7 kg)   SpO2 97%   BMI 25.50 kg/m   Patient alert and oriented and in no cardiopulmonary distress.  HEENT: No facial asymmetry, EOMI,   oropharynx pink and moist.  Neck supple no JVD, no mass.  Chest: Clear to auscultation bilaterally.  CVS: S1, S2 no murmurs, no S3.Regular rate.  ABD: Soft non tender.   Ext: No edema  MS: dEcreased  ROM spine, shoulders, hips and knees.  Skin: Intact, no ulcerations or rash noted.  Psych: Good eye contact, normal affect. Memory intact not anxious or depressed appearing.  CNS: CN 2-12 intact, power,  normal throughout.no focal deficits noted.   Assessment &  Plan  Essential hypertension Controlled, no change in medication DASH diet and commitment to daily physical activity for a minimum of 30 minutes discussed and encouraged, as a part of hypertension management. The importance of attaining a healthy weight is also discussed.  BP/Weight 09/28/2016 09/28/2016 06/17/2016 06/01/2016 03/21/2016 03/20/2016 70/76/1518  Systolic BP 343 735 789 784 784 128 208  Diastolic BP 80 80 74 70 61 62 75  Wt. 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   Lipid Panel  Lab Results  Component Value Date   CHOL 183 05/26/2016   HDL 95 05/26/2016   LDLCALC 77 05/26/2016   TRIG 57 05/26/2016   CHOLHDL 1.9 05/26/2016     Updated lab needed at/ before next visit.   Prediabetes Patient educated about the importance of limiting  Carbohydrate intake , the need to commit to daily physical activity for a minimum of 30 minutes , and to commit weight loss. The fact that changes in all these areas will reduce or eliminate all together the development of diabetes is stressed.   Diabetic Labs Latest Ref Rng & Units 05/26/2016 01/20/2016 08/14/2015 03/20/2015 07/11/2014  HbA1c <5.7 % 5.9(H) 5.7(H) 6.0(H) 6.0(H) 5.9(H)  Chol <200 mg/dL 183 190 217(H)  209(H) 206(H)  HDL >50 mg/dL 95 84 101 105 84  Calc LDL <100 mg/dL 77 92 100 89 103(H)  Triglycerides <150 mg/dL 57 72 79 77 94  Creatinine 0.60 - 0.88 mg/dL 0.81 0.87 0.72 0.66 0.87   BP/Weight 09/28/2016 09/28/2016 06/17/2016 06/01/2016 03/21/2016 03/20/2016 47/34/0370  Systolic BP 964 383 818 403 754 360 677  Diastolic BP 80 80 74 70 61 62 75  Wt. (Lbs) 158.12 158 164.8 164.8 160 160 160.4  BMI 25.52 25.5 26.6 26.6 25.82 25.82 25.89   No flowsheet  data found.  Updated lab needed at/ before next visit.   Stress at home uncharged due to failing health of both her spouse and herself with no help  Urinary incontinence Uncontrolled with medication , uses depends outside of her home  Vitamin d deficiency Updated lab needed at/ before next visit.   NASH (nonalcoholic steatohepatitis) F/u with GI in September per last note  Arthritis of right knee Severe , surgery recommended , pt not able to follow through currently Fall precaution and home safety discussed Offered motorized wheelchair, no interest

## 2016-09-28 NOTE — Assessment & Plan Note (Signed)
Updated lab needed at/ before next visit.   

## 2016-09-28 NOTE — Patient Instructions (Addendum)
[name] [name] , Thank you for taking time to come for your Medicare Wellness Visit. I appreciate your ongoing commitment to your health goals. Please review the following plan we discussed and let me know if I can assist you in the future.   Screening recommendations/referrals: Colonoscopy: No longer required Mammogram: No longer required Bone Density: Up to date Recommended yearly ophthalmology/optometry visit for glaucoma screening and checkup Recommended yearly dental visit for hygiene and checkup  Vaccinations: Influenza vaccine: Due 11/2016 Pneumococcal vaccine: Up to date Tdap vaccine: Up to date next due 08/2017 Shingles vaccine: Up to date    Advanced directives: Please bring a copy of your POA (Power of [place]) and/or Living Will to your next appointment.   Conditions/risks identified: Pre-obese, recommend starting a routine exercise program at least 3 days a week for 30-45 minutes at a time as tolerated.   Next appointment: Follow up in 1 year for your annual wellness visit.  Preventive Care 81 Years and Older, Female Preventive care refers to lifestyle choices and visits with your health care provider that can promote health and wellness. What does preventive care include?  A yearly physical exam. This is also called an annual well check.  Dental exams once or twice a year.  Routine eye exams. Ask your health care provider how often you should have your eyes checked.  Personal lifestyle choices, including:  Daily care of your teeth and gums.  Regular physical activity.  Eating a healthy diet.  Avoiding tobacco and drug use.  Limiting alcohol use.  Practicing safe sex.  Taking low-dose aspirin every day.  Taking vitamin and mineral supplements as recommended by your health care provider. What happens during an annual well check? The services and screenings done by your health care provider during your annual well check will depend on your age, overall health,  lifestyle risk factors, and family history of disease. Counseling  Your health care provider may ask you questions about your:  Alcohol use.  Tobacco use.  Drug use.  Emotional well-being.  Home and relationship well-being.  Sexual activity.  Eating habits.  History of falls.  Memory and ability to understand (cognition).  Work and work Statistician.  Reproductive health. Screening  You may have the following tests or measurements:  Height, weight, and BMI.  Blood pressure.  Lipid and cholesterol levels. These may be checked every 5 years, or more frequently if you are over 81 years old.  Skin check.  Lung cancer screening. You may have this screening every year starting at age 81 if you have a 30-pack-year history of smoking and currently smoke or have quit within the past 15 years.  Fecal occult blood test (FOBT) of the stool. You may have this test every year starting at age 81.  Flexible sigmoidoscopy or colonoscopy. You may have a sigmoidoscopy every 5 years or a colonoscopy every 10 years starting at age 81.  Hepatitis C blood test.  Hepatitis B blood test.  Sexually transmitted disease (STD) testing.  Diabetes screening. This is done by checking your blood sugar (glucose) after you have not eaten for a while (fasting). You may have this done every 1-3 years.  Bone density scan. This is done to screen for osteoporosis. You may have this done starting at age 81.  Mammogram. This may be done every 1-2 years. Talk to your health care provider about how often you should have regular mammograms. Talk with your health care provider about your test results, treatment options, and  if necessary, the need for more tests. Vaccines  Your health care provider may recommend certain vaccines, such as:  Influenza vaccine. This is recommended every year.  Tetanus, diphtheria, and acellular pertussis (Tdap, Td) vaccine. You may need a Td booster every 10 years.  Zoster  vaccine. You may need this after age 70.  Pneumococcal 13-valent conjugate (PCV13) vaccine. One dose is recommended after age 81.  Pneumococcal polysaccharide (PPSV23) vaccine. One dose is recommended after age 81. Talk to your health care provider about which screenings and vaccines you need and how often you need them. This information is not intended to replace advice given to you by your health care provider. Make sure you discuss any questions you have with your health care provider. Document Released: 04/05/2015 Document Revised: 11/27/2015 Document Reviewed: 01/08/2015 Elsevier Interactive Patient Education  2017 [place] Prevention in the Home Falls can cause injuries. They can happen to people of all ages. There are many things you can do to make your home safe and to help prevent falls. What can I do on the outside of my home?  Regularly fix the edges of walkways and driveways and fix any cracks.  Remove anything that might make you trip as you walk through a door, such as a raised step or threshold.  Trim any bushes or trees on the path to your home.  Use bright outdoor lighting.  Clear any walking paths of anything that might make someone trip, such as rocks or tools.  Regularly check to see if handrails are loose or broken. Make sure that both sides of any steps have handrails.  Any raised decks and porches should have guardrails on the edges.  Have any leaves, snow, or ice cleared regularly.  Use sand or salt on walking paths during winter.  Clean up any spills in your garage right away. This includes oil or grease spills. What can I do in the bathroom?  Use night lights.  Install grab bars by the toilet and in the tub and shower. Do not use towel bars as grab bars.  Use non-skid mats or decals in the tub or shower.  If you need to sit down in the shower, use a plastic, non-slip stool.  Keep the floor dry. Clean up any water that spills on the  floor as soon as it happens.  Remove soap buildup in the tub or shower regularly.  Attach bath mats securely with double-sided non-slip rug tape.  Do not have throw rugs and other things on the floor that can make you trip. What can I do in the bedroom?  Use night lights.  Make sure that you have a light by your bed that is easy to reach.  Do not use any sheets or blankets that are too big for your bed. They should not hang down onto the floor.  Have a firm chair that has side arms. You can use this for support while you get dressed.  Do not have throw rugs and other things on the floor that can make you trip. What can I do in the kitchen?  Clean up any spills right away.  Avoid walking on wet floors.  Keep items that you use a lot in easy-to-reach places.  If you need to reach something above you, use a strong step stool that has a grab bar.  Keep electrical cords out of the way.  Do not use floor polish or wax that makes floors slippery. If you  must use wax, use non-skid floor wax.  Do not have throw rugs and other things on the floor that can make you trip. What can I do with my stairs?  Do not leave any items on the stairs.  Make sure that there are handrails on both sides of the stairs and use them. Fix handrails that are broken or loose. Make sure that handrails are as long as the stairways.  Check any carpeting to make sure that it is firmly attached to the stairs. Fix any carpet that is loose or worn.  Avoid having throw rugs at the top or bottom of the stairs. If you do have throw rugs, attach them to the floor with carpet tape.  Make sure that you have a light switch at the top of the stairs and the bottom of the stairs. If you do not have them, ask someone to add them for you. What else can I do to help prevent falls?  Wear shoes that:  Do not have high heels.  Have rubber bottoms.  Are comfortable and fit you well.  Are closed at the toe. Do not wear  sandals.  If you use a stepladder:  Make sure that it is fully opened. Do not climb a closed stepladder.  Make sure that both sides of the stepladder are locked into place.  Ask someone to hold it for you, if possible.  Clearly mark and make sure that you can see:  Any grab bars or handrails.  First and last steps.  Where the edge of each step is.  Use tools that help you move around (mobility aids) if they are needed. These include:  Canes.  Walkers.  Scooters.  Crutches.  Turn on the lights when you go into a dark area. Replace any light bulbs as soon as they burn out.  Set up your furniture so you have a clear path. Avoid moving your furniture around.  If any of your floors are uneven, fix them.  If there are any pets around you, be aware of where they are.  Review your medicines with your doctor. Some medicines can make you feel dizzy. This can increase your chance of falling. Ask your doctor what other things that you can do to help prevent falls. This information is not intended to replace advice given to you by your health care provider. Make sure you discuss any questions you have with your health care provider. Document Released: 01/03/2009 Document Revised: 08/15/2015 Document Reviewed: 04/13/2014 Elsevier Interactive Patient Education  2017 [employer].

## 2016-09-28 NOTE — Assessment & Plan Note (Signed)
Hyperlipidemia:Low fat diet discussed and encouraged.   Lipid Panel  Lab Results  Component Value Date   CHOL 183 05/26/2016   HDL 95 05/26/2016   LDLCALC 77 05/26/2016   TRIG 57 05/26/2016   CHOLHDL 1.9 05/26/2016     Updated lab needed at/ before next visit.

## 2016-09-28 NOTE — Assessment & Plan Note (Signed)
F/u with GI in September per last note

## 2016-09-28 NOTE — Assessment & Plan Note (Signed)
Needs follow up test

## 2016-09-28 NOTE — Patient Instructions (Addendum)
Physical exam Nov 11 or after, call if you need me before  No medication changes  Fasting lipid, cmp and EGFr, hBA1C, TSH, vit D and CBC 1 week before next visit.  Be careful not to fall.  Calll for your 6 month f/u with Dr Oneida Alar per helr last note , should be around Sept    Thank you  for choosing [facility]. We consider it a privelige to serve you.  Delivering excellent health care in a caring and  compassionate way is our goal.  Partnering with you,  so that together we can achieve this goal is our strategy.

## 2016-09-28 NOTE — Assessment & Plan Note (Signed)
Controlled, no change in medication DASH diet and commitment to daily physical activity for a minimum of 30 minutes discussed and encouraged, as a part of hypertension management. The importance of attaining a healthy weight is also discussed.  BP/Weight 09/28/2016 09/28/2016 06/17/2016 06/01/2016 03/21/2016 03/20/2016 93/03/2377  Systolic BP 909 400 050 567 889 338 826  Diastolic BP 80 80 74 70 61 62 75  Wt. (Lbs) 158.12 158 164.8 164.8 160 160 160.4  BMI 25.52 25.5 26.6 26.6 25.82 25.82 25.89

## 2016-09-28 NOTE — Assessment & Plan Note (Signed)
uncharged due to failing health of both her spouse and herself with no help

## 2016-09-28 NOTE — Progress Notes (Signed)
Subjective:   [name] [name] is a 83 y.o. female who presents for Medicare Annual (Subsequent) preventive examination.  Review of Systems:  Cardiac Risk Factors include: advanced age (>26mn, >>38women);dyslipidemia;hypertension;sedentary lifestyle     Objective:     Vitals: BP 136/80   Pulse 88   Temp 98.7 F (37.1 C) (Oral)   Ht 5' 6"  (1.676 m)   Wt 158 lb 1.9 oz (71.7 kg)   BMI 25.52 kg/m   Body mass index is 25.52 kg/m.   Tobacco History  Smoking Status  . Never Smoker  Smokeless Tobacco  . Never Used    Comment: Never smoked     Counseling given: Not Answered   Past Medical History:  Diagnosis Date  . Arthritis    spinal stenosis  . Elevated LFTs    secondary to fatty liver, negative work-up in 2011  . Hypercholesterolemia   . Hypertension   . Osteoarthritis    right knee  . PONV (postoperative nausea and vomiting)   . [place] 08[place]   grade 1, no recurrence up to 2013   Past Surgical History:  Procedure Laterality Date  . 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 APPENDECTOMY  1973  . CHOLECYSTECTOMY  1973  . COLONOSCOPY    06/21/2007   ROOI:LNZVJKrectum/Sigmoid diverticula, diminutive hepatic flexure polyp s/p bx. Benign.   . COLONOSCOPY N/A 10/17/2012   Dr. FOneida Alar moderate diverticulosis was noted in the sigmoid colon, moderate sized internal hemorrhoids. next tcs in10 years  . ESOPHAGOGASTRODUODENOSCOPY N/A 03/20/2016   Procedure: ESOPHAGOGASTRODUODENOSCOPY (EGD);  Surgeon: SDanie Binder MD;  Location: [facility];  Service: Endoscopy;  Laterality: N/A;  830  . FOOT SURGERY  2007   Pins in toes on left foot, 5 to 6 yrs ago  . GIVENS CAPSULE STUDY N/A 03/20/2016   Procedure: GIVENS CAPSULE STUDY;  Surgeon: SDanie Binder MD;  Location: [facility];  Service: Endoscopy;  Laterality: N/A;  . TONSILLECTOMY     Family History  Problem Relation Age of Onset  . Heart disease Father   . Bladder Cancer Brother          in remission   . Hypertension Sister   . Dementia Sister   . Diabetes type II Sister   . Hypertension Sister   . Aneurysm Sister   . Hypertension Brother   . Hypertension Son   . Colon cancer Neg Hx    History  Sexual Activity  . Sexual activity: No    Outpatient Encounter Prescriptions as of 09/28/2016  Medication Sig  . aspirin 81 MG tablet Take 81 mg by mouth as needed.   .Marland Kitchenatorvastatin (LIPITOR) 10 MG tablet Take 1 tablet (10 mg total) by mouth daily. (Patient taking differently: Take 10 mg by mouth 2 (two) times a week. Takes daily on Monday and Thursday)  . cholecalciferol (VITAMIN D) 1000 units tablet Take 1,000 Units by mouth daily.  . Chromium-Cinnamon [phone] MCG-MG CAPS Take 2 tablets by mouth daily.  . Misc Natural Products (OSTEO BI-FLEX JOINT SHIELD) TABS Take 1 tablet by mouth daily.  . potassium chloride (K-DUR) 10 MEQ tablet TAKE 1 TABLET EVERY DAY  . triamterene-hydrochlorothiazide (MAXZIDE) 75-50 MG tablet TAKE 1 TABLET EVERY DAY  . [DISCONTINUED] pantoprazole (PROTONIX) 40 MG tablet 1 po 30 mins prior to first meal   No facility-administered encounter medications on file as of 09/28/2016.     Activities of Daily Living In  your present state of health, do you have any difficulty performing the following activities: 09/28/2016 01/31/2016  Hearing? N N  Vision? N N  Difficulty concentrating or making decisions? N N  Walking or climbing stairs? Y Y  Dressing or bathing? N N  Doing errands, shopping? N N  Preparing Food and eating ? N -  Using the Toilet? N -  In the past six months, have you accidently leaked urine? N -  Do you have problems with loss of bowel control? N -  Managing your Medications? N -  Managing your Finances? N -  Housekeeping or managing your Housekeeping? N -  Some recent data might be hidden    Patient Care Team: Fayrene Helper, MD as PCP - General Carole Civil, MD as Consulting Physician (Orthopedic  Surgery) Irine Seal, MD as Attending Physician (Urology) Danie Binder, MD as Consulting Physician (Gastroenterology)    Assessment:    Exercise Activities and Dietary recommendations Current Exercise Habits: The patient does not participate in regular exercise at present, Exercise limited by: orthopedic condition(s)  Goals    . Exercise 3x per week (30 min per time)          Recommend starting a routine exercise program at least 3 days a week for 30-45 minutes at a time as tolerated.        Fall Risk Fall Risk  09/28/2016 06/01/2016 10/31/2015 03/26/2015 07/18/2014  Falls in the past year? Yes Yes Yes No No  Number falls in past yr: 1 2 or more 1 - -  Injury with Fall? No No - - -  Risk for fall due to : Impaired balance/gait - - - Impaired balance/gait  Follow up Falls evaluation completed;Education provided;Falls prevention discussed - - - -   Depression Screen PHQ 2/9 Scores 09/28/2016 06/01/2016 11/29/2014 07/18/2014  PHQ - 2 Score 0 2 2 0  PHQ- 9 Score - 2 6 -     Cognitive Function: Normal   6CIT Screen 09/28/2016  What Year? 0 points  What month? 0 points  What time? 0 points  Count back from 20 0 points  Months in reverse 0 points  Repeat phrase 0 points  Total Score 0    Immunization History  Administered Date(s) Administered  . Influenza Split 12/22/2012, 01/04/2014, 12/13/2014  . Influenza Whole 01/21/2007, 05/10/2008, 12/11/2008, 12/06/2009  . Influenza-Unspecified 11/21/2013, 12/20/2015  . Pneumococcal Conjugate-13 11/02/2013  . Pneumococcal Polysaccharide-23 08/15/2010  . Td 08/31/2007  . Zoster 08/31/2006   Screening Tests Health Maintenance  Topic Date Due  . INFLUENZA VACCINE  10/21/2016  . TETANUS/TDAP  08/30/2017  . DEXA SCAN  Completed  . PNA vac Low Risk Adult  Completed      Plan:   I have personally reviewed and noted the following in the patient's chart:   . Medical and social history . Use of alcohol, tobacco or illicit drugs  . Current  medications and supplements . Functional ability and status . Nutritional status . Physical activity . Advanced directives . List of other physicians . Hospitalizations, surgeries, and ER visits in previous 12 months . Vitals . Screenings to include cognitive, depression, and falls . Referrals and appointments  In addition, I have reviewed and discussed with patient certain preventive protocols, quality metrics, and best practice recommendations. A written personalized care plan for preventive services as well as general preventive health recommendations were provided to patient.     Stormy Fabian, LPN  08/28/1243  Lead  Nurse Health Advisor

## 2016-09-28 NOTE — Assessment & Plan Note (Signed)
Severe , surgery recommended , pt not able to follow through currently Fall precaution and home safety discussed Offered motorized wheelchair, no interest

## 2016-10-26 ENCOUNTER — Other Ambulatory Visit: Payer: Self-pay | Admitting: Family Medicine

## 2016-11-02 ENCOUNTER — Encounter: Payer: Self-pay | Admitting: Gastroenterology

## 2016-12-07 ENCOUNTER — Other Ambulatory Visit: Payer: Self-pay | Admitting: Family Medicine

## 2016-12-07 NOTE — Telephone Encounter (Signed)
Seen [phone]

## 2017-01-11 ENCOUNTER — Other Ambulatory Visit: Payer: Self-pay | Admitting: Family Medicine

## 2017-01-11 DIAGNOSIS — E785 Hyperlipidemia, unspecified: Secondary | ICD-10-CM

## 2017-02-09 DIAGNOSIS — E785 Hyperlipidemia, unspecified: Secondary | ICD-10-CM | POA: Diagnosis not present

## 2017-02-09 DIAGNOSIS — I10 Essential (primary) hypertension: Secondary | ICD-10-CM | POA: Diagnosis not present

## 2017-02-09 DIAGNOSIS — R7303 Prediabetes: Secondary | ICD-10-CM | POA: Diagnosis not present

## 2017-02-10 LAB — COMPLETE METABOLIC PANEL WITH GFR
AG Ratio: 1.4 (calc) (ref 1.0–2.5)
ALBUMIN MSPROF: 3.9 g/dL (ref 3.6–5.1)
ALT: 38 U/L — ABNORMAL HIGH (ref 6–29)
AST: 57 U/L — AB (ref 10–35)
Alkaline phosphatase (APISO): 45 U/L (ref 33–130)
BUN/Creatinine Ratio: 35 (calc) — ABNORMAL HIGH (ref 6–22)
BUN: 28 mg/dL — ABNORMAL HIGH (ref 7–25)
CALCIUM: 9.9 mg/dL (ref 8.6–10.4)
CO2: 30 mmol/L (ref 20–32)
CREATININE: 0.81 mg/dL (ref 0.60–0.88)
Chloride: 104 mmol/L (ref 98–110)
GFR, EST AFRICAN AMERICAN: 78 mL/min/{1.73_m2} (ref >=60)
GFR, EST NON AFRICAN AMERICAN: 68 mL/min/{1.73_m2} (ref >=60)
GLOBULIN: 2.7 g/dL (ref 1.9–3.7)
Glucose, Bld: 104 mg/dL — ABNORMAL HIGH (ref 65–99)
Potassium: 3.6 mmol/L (ref 3.5–5.3)
SODIUM: 143 mmol/L (ref 135–146)
TOTAL PROTEIN: 6.6 g/dL (ref 6.1–8.1)
Total Bilirubin: 0.7 mg/dL (ref 0.2–1.2)

## 2017-02-10 LAB — LIPID PANEL
CHOL/HDL RATIO: 2.2 (calc) (ref <5.0)
CHOLESTEROL: 226 mg/dL — AB (ref <200)
HDL: 105 mg/dL (ref >50)
LDL Cholesterol (Calc): 105 mg/dL (calc) — ABNORMAL HIGH
Non-HDL Cholesterol (Calc): 121 mg/dL (calc) (ref <130)
Triglycerides: 71 mg/dL (ref <150)

## 2017-02-10 LAB — CBC
HEMATOCRIT: 36.6 % (ref 35.0–45.0)
HEMOGLOBIN: 12 g/dL (ref 11.7–15.5)
MCH: 29.1 pg (ref 27.0–33.0)
MCHC: 32.8 g/dL (ref 32.0–36.0)
MCV: 88.6 fL (ref 80.0–100.0)
MPV: 11.7 fL (ref 7.5–12.5)
Platelets: 184 10*3/uL (ref 140–400)
RBC: 4.13 10*6/uL (ref 3.80–5.10)
RDW: 12.3 % (ref 11.0–15.0)
WBC: 5.2 10*3/uL (ref 3.8–10.8)

## 2017-02-10 LAB — HEMOGLOBIN A1C
Hgb A1c MFr Bld: 5.7 % of total Hgb — ABNORMAL HIGH (ref <5.7)
Mean Plasma Glucose: 117 (calc)
eAG (mmol/L): 6.5 (calc)

## 2017-02-10 LAB — VITAMIN D 25 HYDROXY (VIT D DEFICIENCY, FRACTURES): Vit D, 25-Hydroxy: 48 ng/mL (ref 30–100)

## 2017-02-10 LAB — TSH: TSH: 3.22 mIU/L (ref 0.40–4.50)

## 2017-02-15 ENCOUNTER — Encounter: Payer: Medicare HMO | Admitting: Family Medicine

## 2017-02-16 DIAGNOSIS — H25813 Combined forms of age-related cataract, bilateral: Secondary | ICD-10-CM | POA: Diagnosis not present

## 2017-02-16 DIAGNOSIS — H5200 Hypermetropia, unspecified eye: Secondary | ICD-10-CM | POA: Diagnosis not present

## 2017-04-22 ENCOUNTER — Ambulatory Visit (INDEPENDENT_AMBULATORY_CARE_PROVIDER_SITE_OTHER): Payer: Medicare HMO | Admitting: Family Medicine

## 2017-04-22 ENCOUNTER — Encounter: Payer: Self-pay | Admitting: Family Medicine

## 2017-04-22 VITALS — BP 114/70 | HR 92 | Resp 16 | Ht 66.0 in | Wt 155.0 lb

## 2017-04-22 DIAGNOSIS — M1711 Unilateral primary osteoarthritis, right knee: Secondary | ICD-10-CM

## 2017-04-22 DIAGNOSIS — Z1231 Encounter for screening mammogram for malignant neoplasm of breast: Secondary | ICD-10-CM

## 2017-04-22 DIAGNOSIS — Z1211 Encounter for screening for malignant neoplasm of colon: Secondary | ICD-10-CM

## 2017-04-22 DIAGNOSIS — Z23 Encounter for immunization: Secondary | ICD-10-CM | POA: Diagnosis not present

## 2017-04-22 DIAGNOSIS — Z0000 Encounter for general adult medical examination without abnormal findings: Secondary | ICD-10-CM | POA: Diagnosis not present

## 2017-04-22 LAB — POC HEMOCCULT BLD/STL (OFFICE/1-CARD/DIAGNOSTIC): FECAL OCCULT BLD: NEGATIVE

## 2017-04-22 MED ORDER — UNABLE TO FIND: 0 refills | Status: DC

## 2017-04-22 NOTE — Progress Notes (Signed)
    [name] [name]     MRN: [id number]      DOB: 12-10-1932  HPI: Patient is in for annual physical exam. Requests walker with seat for ambulation in the home, has severe arthritis of the knees Recent labs, if available are reviewed. Immunization is reviewed , and  Is up to date    BP 114/70   Pulse 92   Resp 16   Ht 5' 6"  (1.676 m)   Wt 155 lb (70.3 kg)   SpO2 99%   BMI 25.02 kg/m   Pleasant  female, alert and oriented x 3, in no cardio-pulmonary distress. Afebrile. HEENT No facial trauma or asymetry. Sinuses non tender.  Extra occullar muscles intact, pupils equally reactive to light. External ears normal, tympanic membranes clear. Oropharynx moist, no exudate. Neck: supple, no adenopathy,JVD or thyromegaly.No bruits.  Chest: Clear to ascultation bilaterally.No crackles or wheezes. Non tender to palpation  Breast: No asymetry,no masses or lumps. No tenderness. No nipple discharge or inversion. No axillary or supraclavicular adenopathy  Cardiovascular system; Heart sounds normal,  S1 and  S2 ,no S3.  No murmur, or thrill. Apical beat not displaced Peripheral pulses normal.  Abdomen: Soft, non tender, no organomegaly or masses. No bruits. Bowel sounds normal. No guarding, tenderness or rebound.  Rectal:  Normal sphincter tone. No rectal mass. Guaiac negative stool.  GU: Not examined  Musculoskeletal exam: Decreased  ROM of spine, hips , shoulders and knees.  deformity ,swelling and  crepitus noted.of knees  muscle wasting and  Atrophy noted of quadriceps   Neurologic: Cranial nerves 2 to 12 intact. Power, tone ,sensation normal Abnormal  gait. No tremor.  Skin: Intact, no ulceration, erythema , scaling or rash noted. Pigmentation normal throughout  Psych; Normal mood and affect. Judgement and concentration normal   Assessment & Plan:  Annual physical exam Annual exam as documented. Home safety and cancer screening needs are specifically  addressed at this visit.   Arthritis of right knee High fall risk, walker with seat prescribed

## 2017-04-22 NOTE — Patient Instructions (Addendum)
Wellness visit with nurse in 6 months  MD follow up in October, call if you need me before  Call in September for labs  Flu vaccine today.  WE will schedule mammogram for April 24 or after and call you with appt information  No changes in medication   Fall Prevention in the Home Falls can cause injuries. They can happen to people of all ages. There are many things you can do to make your home safe and to help prevent falls. What can I do on the outside of my home?  Regularly fix the edges of walkways and driveways and fix any cracks.  Remove anything that might make you trip as you walk through a door, such as a raised step or threshold.  Trim any bushes or trees on the path to your home.  Use bright outdoor lighting.  Clear any walking paths of anything that might make someone trip, such as rocks or tools.  Regularly check to see if handrails are loose or broken. Make sure that both sides of any steps have handrails.  Any raised decks and porches should have guardrails on the edges.  Have any leaves, snow, or ice cleared regularly.  Use sand or salt on walking paths during winter.  Clean up any spills in your garage right away. This includes oil or grease spills. What can I do in the bathroom?  Use night lights.  Install grab bars by the toilet and in the tub and shower. Do not use towel bars as grab bars.  Use non-skid mats or decals in the tub or shower.  If you need to sit down in the shower, use a plastic, non-slip stool.  Keep the floor dry. Clean up any water that spills on the floor as soon as it happens.  Remove soap buildup in the tub or shower regularly.  Attach bath mats securely with double-sided non-slip rug tape.  Do not have throw rugs and other things on the floor that can make you trip. What can I do in the bedroom?  Use night lights.  Make sure that you have a light by your bed that is easy to reach.  Do not use any sheets or blankets that  are too big for your bed. They should not hang down onto the floor.  Have a firm chair that has side arms. You can use this for support while you get dressed.  Do not have throw rugs and other things on the floor that can make you trip. What can I do in the kitchen?  Clean up any spills right away.  Avoid walking on wet floors.  Keep items that you use a lot in easy-to-reach places.  If you need to reach something above you, use a strong step stool that has a grab bar.  Keep electrical cords out of the way.  Do not use floor polish or wax that makes floors slippery. If you must use wax, use non-skid floor wax.  Do not have throw rugs and other things on the floor that can make you trip. What can I do with my stairs?  Do not leave any items on the stairs.  Make sure that there are handrails on both sides of the stairs and use them. Fix handrails that are broken or loose. Make sure that handrails are as long as the stairways.  Check any carpeting to make sure that it is firmly attached to the stairs. Fix any carpet that is loose or  worn.  Avoid having throw rugs at the top or bottom of the stairs. If you do have throw rugs, attach them to the floor with carpet tape.  Make sure that you have a light switch at the top of the stairs and the bottom of the stairs. If you do not have them, ask someone to add them for you. What else can I do to help prevent falls?  Wear shoes that: ? Do not have high heels. ? Have rubber bottoms. ? Are comfortable and fit you well. ? Are closed at the toe. Do not wear sandals.  If you use a stepladder: ? Make sure that it is fully opened. Do not climb a closed stepladder. ? Make sure that both sides of the stepladder are locked into place. ? Ask someone to hold it for you, if possible.  Clearly mark and make sure that you can see: ? Any grab bars or handrails. ? First and last steps. ? Where the edge of each step is.  Use tools that help you  move around (mobility aids) if they are needed. These include: ? Canes. ? Walkers. ? Scooters. ? Crutches.  Turn on the lights when you go into a dark area. Replace any light bulbs as soon as they burn out.  Set up your furniture so you have a clear path. Avoid moving your furniture around.  If any of your floors are uneven, fix them.  If there are any pets around you, be aware of where they are.  Review your medicines with your doctor. Some medicines can make you feel dizzy. This can increase your chance of falling. Ask your doctor what other things that you can do to help prevent falls. This information is not intended to replace advice given to you by your health care provider. Make sure you discuss any questions you have with your health care provider. Document Released: 01/03/2009 Document Revised: 08/15/2015 Document Reviewed: 04/13/2014 Elsevier Interactive Patient Education  [employer].

## 2017-04-25 ENCOUNTER — Encounter: Payer: Self-pay | Admitting: Family Medicine

## 2017-04-25 NOTE — Assessment & Plan Note (Signed)
High fall risk, walker with seat prescribed

## 2017-04-25 NOTE — Assessment & Plan Note (Addendum)
Annual exam as documented. Home safety and cancer screening needs are specifically addressed at this visit.

## 2017-04-26 ENCOUNTER — Other Ambulatory Visit: Payer: Self-pay | Admitting: Family Medicine

## 2017-04-26 DIAGNOSIS — M1711 Unilateral primary osteoarthritis, right knee: Secondary | ICD-10-CM | POA: Diagnosis not present

## 2017-06-14 ENCOUNTER — Telehealth: Payer: Self-pay | Admitting: Family Medicine

## 2017-06-14 ENCOUNTER — Other Ambulatory Visit: Payer: Self-pay

## 2017-06-14 MED ORDER — POTASSIUM CHLORIDE ER 10 MEQ PO TBCR: 10.0000 meq | EXTENDED_RELEASE_TABLET | Freq: Every day | ORAL | 1 refills | Status: DC

## 2017-06-14 NOTE — Telephone Encounter (Signed)
Done

## 2017-06-14 NOTE — Telephone Encounter (Signed)
Needs her Potassium called in to [facility]

## 2017-07-29 ENCOUNTER — Encounter: Payer: Self-pay | Admitting: Gastroenterology

## 2017-07-29 ENCOUNTER — Ambulatory Visit: Payer: Medicare HMO | Admitting: Gastroenterology

## 2017-07-29 VITALS — BP 153/89 | HR 87 | Temp 96.9°F | Ht 66.0 in | Wt 145.6 lb

## 2017-07-29 DIAGNOSIS — R634 Abnormal weight loss: Secondary | ICD-10-CM | POA: Insufficient documentation

## 2017-07-29 DIAGNOSIS — K5900 Constipation, unspecified: Secondary | ICD-10-CM

## 2017-07-29 DIAGNOSIS — K219 Gastro-esophageal reflux disease without esophagitis: Secondary | ICD-10-CM

## 2017-07-29 DIAGNOSIS — K7581 Nonalcoholic steatohepatitis (NASH): Secondary | ICD-10-CM

## 2017-07-29 MED ORDER — LINACLOTIDE 72 MCG PO CAPS: 72.0000 ug | ORAL_CAPSULE | Freq: Every day | ORAL | 0 refills | Status: DC

## 2017-07-29 NOTE — Assessment & Plan Note (Signed)
Doing well. Does not take protonix all the time but says she plans to be more consistent. Reinforced antireflux measures.

## 2017-07-29 NOTE — Progress Notes (Addendum)
REVIEWED-NO ADDITIONAL RECOMMENDATIONS.  Primary Care Physician: Fayrene Helper, MD  Primary Gastroenterologist:  Barney Drain, MD   Chief Complaint  Patient presents with  . Gas    HPI: [name] [name] is a 84 y.o. female here for follow-up of Karlene Lineman and GERD.  Last seen in March 2018.  Recent physical PCP, Hemoccult negative.  Last labs in November 2018, CBC normal.  AST and ALT slightly elevated but stable at 57 and 38 respectively.  Weight is down about 20 pounds from 1 year ago.  Down 10 pounds since January.  She continues to have issues with constipation. Gas will build up and she feels bloated, then usually will take OTC laxative. This will result in multiple BMs throughout the day. No melena, brbpr. No PP abd pain or diarrhea. No n/v. She denies significant heartburn.   She was surprised about her weight loss. She new she had lost some weight based on her clothes and how they fit. She believes her weight loss is due to stress and not eating enough. She states with her back and knees it takes her much longer to do things and therefore she tends to skip breakfast and lunch. Usually will eat a decent dinner.   States she was never able to try Linzess before as recommended by Dr. Oneida Alar. She wants to try now.     Current Outpatient Medications  Medication Sig Dispense Refill  . aspirin 81 MG tablet Take 81 mg by mouth as needed.     Marland Kitchen atorvastatin (LIPITOR) 10 MG tablet TAKE 1 TABLET EVERY DAY (Patient taking differently: TAKE 1 TABLET EVERY DAY. Takes twice a week) 90 tablet 1  . cholecalciferol (VITAMIN D) 1000 units tablet Take 1,000 Units by mouth daily.    . Chromium-Cinnamon [phone] MCG-MG CAPS Take 2 tablets by mouth daily.    . Misc Natural Products (OSTEO BI-FLEX JOINT SHIELD) TABS Take 1 tablet by mouth daily.    . potassium chloride (K-DUR) 10 MEQ tablet Take 1 tablet (10 mEq total) by mouth daily. 90 tablet 1  . triamterene-hydrochlorothiazide (MAXZIDE) 75-50 MG  tablet TAKE 1 TABLET EVERY DAY 90 tablet 1  . UNABLE TO FIND Rolling walker with seat  Dx M17.11 1 each 0   No current facility-administered medications for this visit.     Allergies as of 07/29/2017 - Review Complete 07/29/2017  Allergen Reaction Noted  . Ciprofloxacin hcl  06/23/2010  . Hydrocodone Nausea Only 01/19/2012  . Penicillins  09/08/2007    ROS:  General: Negative for anorexia,  fever, chills, fatigue, weakness. See hpi ENT: Negative for hoarseness, difficulty swallowing , nasal congestion. CV: Negative for chest pain, angina, palpitations, dyspnea on exertion, peripheral edema.  Respiratory: Negative for dyspnea at rest, dyspnea on exertion, cough, sputum, wheezing.  GI: See history of present illness. GU:  Negative for dysuria, hematuria, urinary incontinence, urinary frequency, nocturnal urination.  Endo: Negative for unusual weight change.    Physical Examination:   BP (!) 153/89   Pulse 87   Temp (!) 96.9 F (36.1 C) (Oral)   Ht 5' 6"  (1.676 m)   Wt 145 lb 9.6 oz (66 kg)   BMI 23.50 kg/m   General: Well-nourished, well-developed in no acute distress.  Eyes: No icterus. Mouth: Oropharyngeal mucosa moist and pink Abdomen: Bowel sounds are normal, nontender, nondistended, no hepatosplenomegaly or masses, no abdominal bruits or hernia , no rebound or guarding.   Extremities: No lower extremity edema. No clubbing or  deformities. Neuro: Alert and oriented x 4   Skin: Warm and dry, no jaundice.   Psych: Alert and cooperative, normal mood and affect.  Labs:  Lab Results  Component Value Date   CREATININE 0.81 02/09/2017   BUN 28 (H) 02/09/2017   NA 143 02/09/2017   K 3.6 02/09/2017   CL 104 02/09/2017   CO2 30 02/09/2017   Lab Results  Component Value Date   ALT 38 (H) 02/09/2017   AST 57 (H) 02/09/2017   ALKPHOS 48 05/26/2016   BILITOT 0.7 02/09/2017   Lab Results  Component Value Date   WBC 5.2 02/09/2017   HGB 12.0 02/09/2017   HCT 36.6  02/09/2017   MCV 88.6 02/09/2017   PLT 184 02/09/2017   Lab Results  Component Value Date   HGBA1C 5.7 (H) 02/09/2017    Imaging Studies: No results found.

## 2017-07-29 NOTE — Assessment & Plan Note (Signed)
Weight continues to slowly decline over the past several years. 10 pounds down in four months and 20 pounds down in past one year. She believes she is not eating adequately, citing it takes her so long to complete anything due to her back pain and arthritis. She is under a lot of stress as well. Encouraged to eat more regularly. She will monitor her weight at home. If loses more than 5 more pounds, she will call. Would consider further work up to exclude malignancy in that case. Return to the office in 3 months.

## 2017-07-29 NOTE — Assessment & Plan Note (Signed)
Trial of low dose Linzess 19mg daily on empty stomach. Samples provided. [facility]

## 2017-07-29 NOTE — Patient Instructions (Signed)
1. Trial of Linzess 63mg once daily before breakfast for constipation. Samples provided. Call and let me know how they work and then we will call in a prescription.  2. Please weigh yourself tomorrow morning and then once weekly in the morning. If you notice greater than five pound weight loss, please let me know. I encourage you to increase your calorie intake to try and maintain your weight.  3. Return to the office in 3 months or sooner if needed.

## 2017-07-29 NOTE — Assessment & Plan Note (Signed)
Labs are stable. Will continue to monitor. Return to the office in 3 months.

## 2017-07-30 NOTE — Progress Notes (Signed)
cc'ed to pcp °

## 2017-09-06 ENCOUNTER — Telehealth: Payer: Self-pay | Admitting: Family Medicine

## 2017-09-06 ENCOUNTER — Other Ambulatory Visit: Payer: Self-pay | Admitting: Family Medicine

## 2017-09-06 DIAGNOSIS — M549 Dorsalgia, unspecified: Secondary | ICD-10-CM

## 2017-09-06 NOTE — Telephone Encounter (Signed)
Done, pls see referral to dr Carloyn Manner

## 2017-09-06 NOTE — Telephone Encounter (Signed)
Patient wants a referral to Dr Glenna Fellows for the bulging disk in her back.

## 2017-09-06 NOTE — Telephone Encounter (Signed)
pls refer , I will sign

## 2017-09-06 NOTE — Progress Notes (Signed)
qa

## 2017-09-06 NOTE — Telephone Encounter (Signed)
Ok to refer.

## 2017-09-09 ENCOUNTER — Encounter (HOSPITAL_COMMUNITY): Payer: Self-pay

## 2017-09-09 ENCOUNTER — Ambulatory Visit (HOSPITAL_COMMUNITY)
Admission: RE | Admit: 2017-09-09 | Discharge: 2017-09-09 | Disposition: A | Discharge status: Home / Self Care | Payer: Medicare HMO | Source: Ambulatory Visit | Attending: Family Medicine | Admitting: Family Medicine

## 2017-09-09 DIAGNOSIS — Z1231 Encounter for screening mammogram for malignant neoplasm of breast: Secondary | ICD-10-CM | POA: Insufficient documentation

## 2017-09-16 ENCOUNTER — Telehealth: Payer: Self-pay | Admitting: Family Medicine

## 2017-09-16 NOTE — Telephone Encounter (Signed)
Patient requested a referral to Dr.Roy for bulging disk in her back. However, Dr.Roy will not schedule her without an MRI. Can you schedule her an MRI on her back ? Cb# [id number]/ [id number]

## 2017-09-20 ENCOUNTER — Telehealth: Payer: Self-pay | Admitting: Family Medicine

## 2017-09-20 NOTE — Telephone Encounter (Signed)
Pt is calling wanting to know if you can order her MRI  --

## 2017-09-21 NOTE — Telephone Encounter (Signed)
Spoke with patient and send a message to Dr.Simpson

## 2017-09-21 NOTE — Telephone Encounter (Signed)
Spoke with patient and she stated that the neurosurgeon said she needs to have an MRI before she is seen by them. Will you order the MRI?

## 2017-09-22 ENCOUNTER — Other Ambulatory Visit: Payer: Self-pay | Admitting: Family Medicine

## 2017-09-22 ENCOUNTER — Telehealth: Payer: Self-pay

## 2017-09-22 ENCOUNTER — Telehealth: Payer: Self-pay | Admitting: Family Medicine

## 2017-09-22 DIAGNOSIS — M48061 Spinal stenosis, lumbar region without neurogenic claudication: Secondary | ICD-10-CM

## 2017-09-22 DIAGNOSIS — M5416 Radiculopathy, lumbar region: Principal | ICD-10-CM

## 2017-09-22 NOTE — Telephone Encounter (Signed)
I scheduled patient MRI with [facility], called patient to relay information, she requested [facility]. So I cancelled [facility] appt and called [facility] and scheduled her MRI.  I called the patient to relay the information and she said she doesn't want to do it at [facility]. So Velna Hatchet can schedule this MRI w/ [facility] when she comes back because I am not aware of the process.

## 2017-09-22 NOTE — Telephone Encounter (Signed)
This has been ordered , are you able to schedule the test

## 2017-09-22 NOTE — Telephone Encounter (Signed)
Dusty, thank you for EVERYTHING that you did to try to accomodate the patient, I am sorry that we just do not know what patient want before we start trying to help them!!!. I will pass on the request to [facility]!

## 2017-09-22 NOTE — Telephone Encounter (Signed)
MRI scheduled for September 24, 2017 at 1pm at [facility] time 12:45pm Left message for patient with details and number to central scheduling if this appointment date and time doesn't work for her.

## 2017-09-22 NOTE — Telephone Encounter (Signed)
Patient scheduled for September 24, 2017 at 1pm. Left message on patients vm with appointment details.

## 2017-09-24 ENCOUNTER — Ambulatory Visit (HOSPITAL_COMMUNITY): Payer: Medicare HMO

## 2017-09-28 ENCOUNTER — Ambulatory Visit (HOSPITAL_COMMUNITY): Payer: Medicare HMO

## 2017-09-30 ENCOUNTER — Ambulatory Visit (HOSPITAL_COMMUNITY): Payer: Medicare HMO

## 2017-09-30 ENCOUNTER — Telehealth: Payer: Self-pay

## 2017-09-30 DIAGNOSIS — M48061 Spinal stenosis, lumbar region without neurogenic claudication: Secondary | ICD-10-CM

## 2017-09-30 DIAGNOSIS — M5387 Other specified dorsopathies, lumbosacral region: Secondary | ICD-10-CM

## 2017-09-30 NOTE — Telephone Encounter (Signed)
Mri reordered per GI

## 2017-10-05 ENCOUNTER — Ambulatory Visit (INDEPENDENT_AMBULATORY_CARE_PROVIDER_SITE_OTHER): Payer: Medicare HMO | Admitting: Family Medicine

## 2017-10-05 ENCOUNTER — Encounter: Payer: Self-pay | Admitting: Family Medicine

## 2017-10-05 VITALS — BP 119/66 | HR 58

## 2017-10-05 DIAGNOSIS — I10 Essential (primary) hypertension: Secondary | ICD-10-CM

## 2017-10-05 DIAGNOSIS — M1711 Unilateral primary osteoarthritis, right knee: Secondary | ICD-10-CM

## 2017-10-05 DIAGNOSIS — F439 Reaction to severe stress, unspecified: Secondary | ICD-10-CM

## 2017-10-05 DIAGNOSIS — M519 Unspecified thoracic, thoracolumbar and lumbosacral intervertebral disc disorder: Secondary | ICD-10-CM | POA: Diagnosis not present

## 2017-10-05 DIAGNOSIS — M48061 Spinal stenosis, lumbar region without neurogenic claudication: Secondary | ICD-10-CM | POA: Diagnosis not present

## 2017-10-05 MED ORDER — PREDNISONE 10 MG PO TABS: 10.0000 mg | ORAL_TABLET | Freq: Two times a day (BID) | ORAL | 0 refills | Status: DC

## 2017-10-05 NOTE — Patient Instructions (Addendum)
Keep appointments as before ,call if you need me sooner  I have referred you to Dr Carloyn Manner  Keep MRI appointment this week, all te best , you do need some help!  Five days of prednisone is prescribed for right  knee pain

## 2017-10-05 NOTE — Progress Notes (Signed)
   [name] [name]     MRN: [id number]      DOB: [birth year]   HPI [name] [name] is here fto discuss the  Deterioration of her level of functionin the past 6 months.Has to sit to do everything or use her walker . She has to use a support while standing due to back and left lower ext pain to ankle rate between 6 to 8 constant, also her rig knee is very painful and unstable, she has been advised that she needs right knee surgery ,never had left knee problems of significance. Has been to neurosurgery about 5 years ago and dx wih stenosis, now much worse and barely able to function  Notes left lower ext weakness no other symptoms red flag,  C/o increased left knee pain which is new and requests medication to help with this, had intra articular injection in the past which was beneficial  ROS Denies recent fever or chills. Denies sinus pressure, nasal congestion, ear pain or sore throat. Denies chest congestion, productive cough or wheezing. Denies chest pains, palpitations and leg swelling Denies abdominal pain, nausea, vomiting,diarrhea or constipation.   Denies dysuria, frequency, hesitancy or incontinence.  Denies headaches, seizures,  Denies depression, has mild anxiety regarding deteriorations in the health of herself and her spouse but states that they will continue to press on. Knows she will need to go to rehab after any surgery and is trusting that something will work out to care for her spouse during that time Denies skin break down or rash.   PE  BP 119/66 (BP Location: Left Arm, Patient Position: Sitting, Cuff Size: Normal)   Pulse (!) 58   SpO2 98%   Patient alert and oriented and in no cardiopulmonary distress.Patient in pain  HEENT: No facial asymmetry, EOMI,   oropharynx pink and moist.  Neck supple no JVD, no mass.  Chest: Clear to auscultation bilaterally.  CVS: S1, S2 no murmurs, no S3.Regular rate.  ABD: Soft non tender.   Ext: No edema  MS: Decreased  ROM spine,  and  knees.  Skin: Intact, no ulcerations or rash noted.  Psych: Good eye contact, normal affect. Memory intact not anxious or depressed appearing.  CNS: CN 2-12 intact, power,  normal throughout.no focal deficits noted.   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

## 2017-10-05 NOTE — Assessment & Plan Note (Signed)
Increased and uncontrolled pain, 5 day course of prednisone is prescribed

## 2017-10-08 ENCOUNTER — Ambulatory Visit
Admission: RE | Admit: 2017-10-08 | Discharge: 2017-10-08 | Disposition: A | Discharge status: Home / Self Care | Payer: Medicare HMO | Source: Ambulatory Visit | Attending: Family Medicine | Admitting: Family Medicine

## 2017-10-08 DIAGNOSIS — M5387 Other specified dorsopathies, lumbosacral region: Secondary | ICD-10-CM

## 2017-10-08 DIAGNOSIS — M48061 Spinal stenosis, lumbar region without neurogenic claudication: Secondary | ICD-10-CM

## 2017-10-10 ENCOUNTER — Encounter: Payer: Self-pay | Admitting: Family Medicine

## 2017-10-10 NOTE — Assessment & Plan Note (Signed)
significant deterioration  Has upcoming MRI and appt with neurosurgeon, I anticipate that she will require surgery

## 2017-10-10 NOTE — Assessment & Plan Note (Signed)
Progressive deterioration in health and level of function of both patient and her spouse, limitation in available resources to help, patient to reach out forhelp when deemed necessary

## 2017-10-10 NOTE — Assessment & Plan Note (Signed)
Controlled, no change in medication  

## 2017-10-18 ENCOUNTER — Ambulatory Visit: Payer: Medicare HMO | Admitting: Family Medicine

## 2017-11-09 DIAGNOSIS — M546 Pain in thoracic spine: Secondary | ICD-10-CM | POA: Diagnosis not present

## 2017-11-09 DIAGNOSIS — Q7649 Other congenital malformations of spine, not associated with scoliosis: Secondary | ICD-10-CM | POA: Diagnosis not present

## 2017-11-09 DIAGNOSIS — M4316 Spondylolisthesis, lumbar region: Secondary | ICD-10-CM | POA: Diagnosis not present

## 2017-11-09 DIAGNOSIS — M549 Dorsalgia, unspecified: Secondary | ICD-10-CM | POA: Diagnosis not present

## 2017-11-09 DIAGNOSIS — M48062 Spinal stenosis, lumbar region with neurogenic claudication: Secondary | ICD-10-CM | POA: Diagnosis not present

## 2017-11-09 DIAGNOSIS — M5136 Other intervertebral disc degeneration, lumbar region: Secondary | ICD-10-CM | POA: Diagnosis not present

## 2017-11-18 ENCOUNTER — Ambulatory Visit: Payer: Medicare HMO | Admitting: Gastroenterology

## 2017-11-18 ENCOUNTER — Encounter: Payer: Self-pay | Admitting: Gastroenterology

## 2017-11-18 DIAGNOSIS — R634 Abnormal weight loss: Secondary | ICD-10-CM

## 2017-11-18 DIAGNOSIS — K7581 Nonalcoholic steatohepatitis (NASH): Secondary | ICD-10-CM | POA: Diagnosis not present

## 2017-11-18 DIAGNOSIS — K5901 Slow transit constipation: Secondary | ICD-10-CM | POA: Diagnosis not present

## 2017-11-18 DIAGNOSIS — K219 Gastro-esophageal reflux disease without esophagitis: Secondary | ICD-10-CM

## 2017-11-18 MED ORDER — LINACLOTIDE 145 MCG PO CAPS: ORAL_CAPSULE | ORAL | 11 refills | Status: DC

## 2017-11-18 NOTE — Assessment & Plan Note (Signed)
WEIGHT STABLE.  CONTINUE TO MONITOR SYMPTOMS.

## 2017-11-18 NOTE — Assessment & Plan Note (Signed)
SYMPTOMS NOT IDEALLY CONTROLLED ON LINZESS 72 MCG DAILY.  DRINK WATER TO KEEP YOUR URINE LIGHT YELLOW. AVOID ITEMS THAT CAUSE BLOATING & GAS. SEE HANDOUT. INCREASE LINZESS TO 145 MCG WITH YOUR FIRST MEAL. IF IT CAUSES EXPLOSIVE WATERY DIARRHEA ,OPEN LINZESS CAPSULE AND PLACE IN 4 TSP WATER. STIR IT FOR 30 SECONDS AND TAKE 3 TSP OF THE LIQUID. YOU DO NOT NEED TO TAKE THE GRANULES. THE MEDICINE IS IN THE WATER.   PLEASE CALL WITH QUESTIONS OR CONCERNS.  FOLLOW UP IN 3 MOS.

## 2017-11-18 NOTE — Assessment & Plan Note (Signed)
WEIGHT STABLE AT 152-155 LBS SINCE MAY 2019 AND CORRELATES WITH PHYSICAL DECLINE. NO WARNING SIGNS/SYMPTOMS   CONTINUE TO MONITOR SYMPTOMS.

## 2017-11-18 NOTE — Patient Instructions (Addendum)
DRINK WATER TO KEEP YOUR URINE LIGHT YELLOW.  AVOID ITEMS THAT CAUSE BLOATING & GAS. SEE HANDOUT.  INCREASE LINZESS TO 145 MCG WITH YOUR FIRST MEAL. IF IT CAUSES EXPLOSIVE WATERY DIARRHEA ,OPEN LINZESS CAPSULE AND PLACE IN 4 TSP WATER. STIR IT FOR 30 SECONDS AND TAKE 3 TSP OF THE LIQUID. YOU DO NOT NEED TO TAKE THE GRANULES. THE MEDICINE IS IN THE WATER.    PLEASE CALL WITH QUESTIONS OR CONCERNS.  FOLLOW UP IN 3 MOS.   BLOATING AND GAS PREVENTION  Although gas may be uncomfortable and embarrassing, it is not life-threatening. Understanding causes, ways to reduce symptoms, and treatment will help most people find some relief.  Points to remember . Everyone has gas in the digestive tract. Marland Kitchen People often believe normal passage of gas to be excessive. . Gas comes from two main sources: swallowed air and normal breakdown of certain foods by harmless bacteria naturally present in the large intestine. . Many foods with carbohydrates can cause gas. Fats and proteins cause little gas. . Foods that may cause gas include o beans  o vegetables, such as broccoli, cabbage, brussels sprouts, onions, artichokes, and asparagus  o fruits, such as pears, apples, and peaches  o whole grains, such as whole wheat and bran  o soft drinks and fruit drinks  o milk and milk products, such as cheese and ice cream, and packaged foods prepared with lactose, such as bread, cereal, and salad dressing  o foods containing sorbitol, such as dietetic foods and sugar free candies and gums . The most common symptoms of gas are belching, flatulence, bloating, and abdominal pain. However, some of these symptoms are often caused by an intestinal disorder, such as irritable bowel syndrome, rather than too much gas. . The most common ways to reduce the discomfort of gas are changing diet, taking nonprescription medicines, and reducing the amount of air swallowed. . Digestive enzymes, such as lactase supplements, actually help  digest carbohydrates and may allow people to eat foods that normally cause gas.

## 2017-11-18 NOTE — Assessment & Plan Note (Signed)
SYMPTOMS CONTROLLED/RESOLVED.  CONTINUE TO MONITOR SYMPTOMS. 

## 2017-11-18 NOTE — Progress Notes (Signed)
Subjective:    Patient ID: KARLE DESROSIER, female    DOB: 05-09-1932, 84 y.o.   MRN: [id number]  Fayrene Helper, MD   HPI Still BLOATING AND PASSING GAS AND CAN BE EMBARRASSING. AVOIDING MILK AND DAIRY. READING LABELS FOR THE MOST PART. BOWELS MOVE 2-3 TIMES A WEEK. MAY BE 2-3 DAYS A DAY ON ONE DAY. DOESN'T COMPLETE;Y GET EMPTY.  WEIGHT UP AND DOWN: 164 LBS MAR 2018 AND NOW 152 LBS. APPETITE: NOT TOO GOOD. HAVING PROBLEMS WITH BACK AND LEGS. TAKES HER FOREVER TO GET ANYTHING DONE AND SOIT'S HARD TO KEEP THINGS ON A SCHEDULE. SEEING NEUROLOGY AND NEUROSURGERY. STRESS AS HUSBAND IS NOT WELL. SON OFFERED TO GET SOMEONE COME IN BUT SHE HASN'T GOTTEN USED TO IDEA AT THIS POINT. MAY HAVE TROUBLE SWALLOWING PILLS BUT NOT WITH FOOD.  PT DENIES FEVER, CHILLS, HEMATOCHEZIA, HEMATEMESIS, nausea, vomiting, melena, diarrhea, CHEST PAIN, SHORTNESS OF BREATH, CHANGE IN BOWEL IN HABITS, abdominal pain, problems with sedation, OR heartburn or indigestion.  Past Medical History:  Diagnosis Date  . Arthritis    spinal stenosis  . Elevated LFTs    secondary to fatty liver, negative work-up in 2011  . Hypercholesterolemia   . Hypertension   . Osteoarthritis    right knee  . PONV (postoperative nausea and vomiting)   . Uterine cancer ([place]) 08/2006   grade 1, no recurrence up to 2013   Past Surgical History:  Procedure Laterality Date  . 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 APPENDECTOMY  1973  . BREAST BIOPSY Left 2018   Benign  . CHOLECYSTECTOMY  1973  . COLONOSCOPY    06/21/2007   XBD:ZHGDJM rectum/Sigmoid diverticula, diminutive hepatic flexure polyp s/p bx. Benign.   . COLONOSCOPY N/A 10/17/2012   Dr. Oneida Alar- moderate diverticulosis was noted in the sigmoid colon, moderate sized internal hemorrhoids. next tcs in10 years  . ESOPHAGOGASTRODUODENOSCOPY N/A 03/20/2016   Dr. Oneida Alar, widely patent Schatzki ring, anemia due to ASA induced erosive gastritis, mild duodenitis.  gastric bx benign without H.pylori.   Marland Kitchen FOOT SURGERY  2007   Pins in toes on left foot, 5 to 6 yrs ago  . GIVENS CAPSULE STUDY N/A 03/20/2016   Procedure: GIVENS CAPSULE STUDY;  Surgeon: Danie Binder, MD;  [facility];  Service: Endoscopy;  Laterality: N/A;  . TONSILLECTOMY     Allergies  Allergen Reactions  . Ciprofloxacin Hcl     Chills, sick, could not tolerate it  . Hydrocodone Nausea Only  . Penicillins    Current Outpatient Medications  Medication Sig    . aspirin 81 MG tablet Take 81 mg by mouth as needed.     Marland Kitchen atorvastatin (LIPITOR) 10 MG tablet TAKE 1 TABLET EVERY DAY (Patient taking differently: TAKE 1 TABLET EVERY DAY. Takes twice a week)    . cholecalciferol (VITAMIN D) 1000 units tablet Take 1,000 Units by mouth daily.    . Chromium-Cinnamon [phone] MCG-MG CAPS Take 2 tablets by mouth daily.    Marland Kitchen linaclotide (LINZESS) 72 MCG capsule Take 1 capsule (72 mcg total) by mouth daily before breakfast.    . Misc Natural Products (OSTEO BI-FLEX JOINT SHIELD) TABS Take 1 tablet by mouth daily.    . potassium chloride (K-DUR) 10 MEQ tablet Take 1 tablet (10 mEq total) by mouth daily.    Marland Kitchen triamterene-hydrochlorothiazide (MAXZIDE) 75-50 MG tablet TAKE 1 TABLET EVERY DAY    . UNABLE TO FIND Rolling walker with seat  Dx M17.11    .       Review of Systems PER HPI OTHERWISE ALL SYSTEMS ARE NEGATIVE.    Objective:   Physical Exam  Constitutional: She is oriented to person, place, and time. She appears well-developed and well-nourished. No distress.  HENT:  Head: Normocephalic and atraumatic.  Mouth/Throat: Oropharynx is clear and moist. No oropharyngeal exudate.  Eyes: Pupils are equal, round, and reactive to light. No scleral icterus.  Neck: Normal range of motion. Neck supple.  Cardiovascular: Normal rate, regular rhythm and normal heart sounds.  Pulmonary/Chest: Effort normal and breath sounds normal. No respiratory distress.  Abdominal: Soft. Bowel sounds are  normal. She exhibits no distension. There is no tenderness.  Musculoskeletal: She exhibits edema (1+ BILATERAL LEs).  WALKS ASSISTED WITH A CANE.  Lymphadenopathy:    She has no cervical adenopathy.  Neurological: She is alert and oriented to person, place, and time.  NO  NEW FOCAL DEFICITS  Psychiatric: She has a normal mood and affect.  Vitals reviewed.     Assessment & Plan:

## 2017-11-18 NOTE — Progress Notes (Signed)
ON RECALL  °

## 2017-11-18 NOTE — Progress Notes (Signed)
cc'd to pcp 

## 2017-11-23 ENCOUNTER — Telehealth: Payer: Self-pay | Admitting: Family Medicine

## 2017-11-23 NOTE — Telephone Encounter (Signed)
The initial referral was to Dr Carloyn Manner then on 8/1 the referral note said changed to dr Rita Ohara per dr Moshe Cipro. Pt was wanting to know if this is correct and/or which dr she should be seeing. Please advise

## 2017-11-23 NOTE — Telephone Encounter (Signed)
The neurosurgeon she should see is whichever she prefers and who will see her, I have no preference

## 2017-11-23 NOTE — Telephone Encounter (Signed)
Pt does not understand if she need to go to Dr Carloyn Manner or Dr Durene Cal.  Please call her to talk about this.

## 2017-11-30 ENCOUNTER — Telehealth: Payer: Self-pay | Admitting: Family Medicine

## 2017-11-30 NOTE — Telephone Encounter (Signed)
Patient called to see if she needed to canc her appt w/ Dr.Roy 10/1 since she has already seen Dr.Nudleman. I advised yes according to her referral notes.

## 2017-12-13 ENCOUNTER — Telehealth: Payer: Self-pay | Admitting: Family Medicine

## 2017-12-13 DIAGNOSIS — E559 Vitamin D deficiency, unspecified: Secondary | ICD-10-CM

## 2017-12-13 DIAGNOSIS — R7303 Prediabetes: Secondary | ICD-10-CM

## 2017-12-13 DIAGNOSIS — I10 Essential (primary) hypertension: Secondary | ICD-10-CM

## 2017-12-13 DIAGNOSIS — E785 Hyperlipidemia, unspecified: Secondary | ICD-10-CM

## 2017-12-13 NOTE — Telephone Encounter (Signed)
Pt said she is scheduled for labs next week for her appt on 10/10 but noting is documented that she needs labs.  She will check back with [place] next week.

## 2017-12-23 NOTE — Telephone Encounter (Signed)
Patient notified of labs.   

## 2017-12-23 NOTE — Addendum Note (Signed)
Addended by: Eual Fines on: 12/23/2017 02:24 PM   Modules accepted: Orders

## 2017-12-23 NOTE — Telephone Encounter (Signed)
Fasting labs ordered for [facility]- message said she would call back to see if she needed to do labs before her visit. Please let her know she does. Thanks

## 2017-12-27 DIAGNOSIS — R7303 Prediabetes: Secondary | ICD-10-CM | POA: Diagnosis not present

## 2017-12-27 DIAGNOSIS — I10 Essential (primary) hypertension: Secondary | ICD-10-CM | POA: Diagnosis not present

## 2017-12-27 DIAGNOSIS — E785 Hyperlipidemia, unspecified: Secondary | ICD-10-CM | POA: Diagnosis not present

## 2017-12-27 DIAGNOSIS — E559 Vitamin D deficiency, unspecified: Secondary | ICD-10-CM | POA: Diagnosis not present

## 2017-12-28 LAB — COMPLETE METABOLIC PANEL WITH GFR
AG RATIO: 1.6 (calc) (ref 1.0–2.5)
ALBUMIN MSPROF: 4.1 g/dL (ref 3.6–5.1)
ALT: 32 U/L — ABNORMAL HIGH (ref 6–29)
AST: 57 U/L — ABNORMAL HIGH (ref 10–35)
Alkaline phosphatase (APISO): 42 U/L (ref 33–130)
BILIRUBIN TOTAL: 0.7 mg/dL (ref 0.2–1.2)
BUN / CREAT RATIO: 36 (calc) — AB (ref 6–22)
BUN: 32 mg/dL — ABNORMAL HIGH (ref 7–25)
CHLORIDE: 104 mmol/L (ref 98–110)
CO2: 29 mmol/L (ref 20–32)
Calcium: 10 mg/dL (ref 8.6–10.4)
Creat: 0.89 mg/dL — ABNORMAL HIGH (ref 0.60–0.88)
GFR, EST AFRICAN AMERICAN: 69 mL/min/{1.73_m2} (ref >=60)
GFR, Est Non African American: 60 mL/min/{1.73_m2} (ref >=60)
Globulin: 2.6 g/dL (calc) (ref 1.9–3.7)
Glucose, Bld: 93 mg/dL (ref 65–99)
POTASSIUM: 4 mmol/L (ref 3.5–5.3)
Sodium: 142 mmol/L (ref 135–146)
TOTAL PROTEIN: 6.7 g/dL (ref 6.1–8.1)

## 2017-12-28 LAB — CBC
HCT: 36.3 % (ref 35.0–45.0)
HEMOGLOBIN: 12 g/dL (ref 11.7–15.5)
MCH: 29.1 pg (ref 27.0–33.0)
MCHC: 33.1 g/dL (ref 32.0–36.0)
MCV: 88.1 fL (ref 80.0–100.0)
MPV: 11.6 fL (ref 7.5–12.5)
PLATELETS: 187 10*3/uL (ref 140–400)
RBC: 4.12 10*6/uL (ref 3.80–5.10)
RDW: 12.5 % (ref 11.0–15.0)
WBC: 5.2 10*3/uL (ref 3.8–10.8)

## 2017-12-28 LAB — HEMOGLOBIN A1C
Hgb A1c MFr Bld: 5.8 % of total Hgb — ABNORMAL HIGH (ref <5.7)
Mean Plasma Glucose: 120 (calc)
eAG (mmol/L): 6.6 (calc)

## 2017-12-28 LAB — LIPID PANEL
CHOL/HDL RATIO: 2.6 (calc) (ref <5.0)
CHOLESTEROL: 216 mg/dL — AB (ref <200)
HDL: 83 mg/dL (ref >50)
LDL CHOLESTEROL (CALC): 116 mg/dL — AB
Non-HDL Cholesterol (Calc): 133 mg/dL (calc) — ABNORMAL HIGH (ref <130)
TRIGLYCERIDES: 74 mg/dL (ref <150)

## 2017-12-28 LAB — VITAMIN D 25 HYDROXY (VIT D DEFICIENCY, FRACTURES): VIT D 25 HYDROXY: 61 ng/mL (ref 30–100)

## 2017-12-28 LAB — TSH: TSH: 2.09 m[IU]/L (ref 0.40–4.50)

## 2017-12-30 ENCOUNTER — Encounter: Payer: Self-pay | Admitting: Gastroenterology

## 2017-12-30 ENCOUNTER — Encounter: Payer: Self-pay | Admitting: Family Medicine

## 2017-12-30 ENCOUNTER — Ambulatory Visit (INDEPENDENT_AMBULATORY_CARE_PROVIDER_SITE_OTHER): Payer: Medicare HMO | Admitting: Family Medicine

## 2017-12-30 DIAGNOSIS — N39498 Other specified urinary incontinence: Secondary | ICD-10-CM

## 2017-12-30 DIAGNOSIS — E785 Hyperlipidemia, unspecified: Secondary | ICD-10-CM

## 2017-12-30 DIAGNOSIS — F439 Reaction to severe stress, unspecified: Secondary | ICD-10-CM | POA: Diagnosis not present

## 2017-12-30 DIAGNOSIS — I10 Essential (primary) hypertension: Secondary | ICD-10-CM | POA: Diagnosis not present

## 2017-12-30 DIAGNOSIS — M1711 Unilateral primary osteoarthritis, right knee: Secondary | ICD-10-CM | POA: Diagnosis not present

## 2017-12-30 DIAGNOSIS — M5387 Other specified dorsopathies, lumbosacral region: Secondary | ICD-10-CM

## 2017-12-30 DIAGNOSIS — Z23 Encounter for immunization: Secondary | ICD-10-CM

## 2017-12-30 NOTE — Progress Notes (Signed)
[name] [name]     MRN: [id number]      DOB: March 29, 1932   HPI [name] [name] is here for follow up and re-evaluation of chronic medical conditions, medication management and review of any available recent lab and radiology data.  Preventive health is updated, specifically  Cancer screening and Immunization.   Questions or concerns regarding consultations or procedures which the PT has had in the interim are  Addressed. Neurosurgery has referred her to a neurologist for evaluation as there isa question of neurologic disease being a contributor to her de bility Requests lift chair because of inability to raise herself up from sitting without a significant amount of pain, has severe s[pinal stenosis, and she has had several falls in her home when  Trying to get around MRI of 09/2017 shows progressive and severe spinal stenosis with nerve compression of right L$ nerve root, which markedly reduces her ability to move around safely and freely ,and limits her ability to stand for more than 10 minutes. To raise herself up from a sitting position, she has to brace on her  Shoulders for support and stability and strength, all of which make position change and getting out of a chair a challenge, , so the need for a lift chair is REAL and would improve the quality of her life significantly.   ROS Denies recent fever or chills. Denies sinus pressure, nasal congestion, ear pain or sore throat. Denies chest congestion, productive cough or wheezing. Denies chest pains, palpitations and leg swelling Denies abdominal pain, nausea, vomiting,diarrhea or constipation.   Denies dysuria, frequency, c/o worsening incontinence symptonms and intends to return to Urology for re evaluatiion  Denies headaches, seizures, numbness, or tingling. Denies depression, anxiety or insomnia. Denies skin break down or rash.   PE  BP 120/70   Pulse 100   Resp 12   Ht 5' 6"  (1.676 m)   Wt 150 lb 1.9 oz (68.1 kg)   SpO2 91% Comment:  room air  BMI 24.23 kg/m   Patient alert and oriented and in no cardiopulmonary distress.  HEENT: No facial asymmetry, EOMI,   oropharynx pink and moist.  Neck supple no JVD, no mass.  Chest: Clear to auscultation bilaterally.  CVS: S1, S2 no murmurs, no S3.Regular rate.  ABD: Soft non tender.   Ext: No edema  MS: Markedly reduced  ROM spine, , also reduced in shoulders, hips and markedly in right knee.  Skin: Intact, no ulcerations or rash noted.  Psych: Good eye contact, normal affect. Memory intact not anxious or depressed appearing.  CNS: CN 2-12 intact, power,  normal throughout.no focal deficits noted.   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 Marked difficulty in raising from a sitting position, and will benefit from a lift chair. Currently being evaluated  By neurosurgery and as a follow up neurology for best management approach to lessen her debility if possible  Urinary incontinence States worsened, and  intends to schedule appt for f/u with Urologist who has evaluated her in the past, will refer  Essential hypertension Controlled, no change in medication DASH diet and commitment to daily physical activity for a minimum of 30 minutes discussed and encouraged, as a part of hypertension management. The importance of attaining a healthy weight is also discussed.  BP/Weight 12/30/2017 11/18/2017 10/05/2017 07/29/2017 04/22/2017 09/28/2016 09/28/2016  Systolic BP 370 230 172 091 068 166 196  Diastolic BP 70 79 66 89 70 80 80  Wt. (Lbs) 150.12 152.4 - 145.6 155 158.12 158  BMI 24.23 24.6 - 23.5 25.02 25.52 25.5       Arthritis of right knee severe arthritis limiting ability to safely weight bear and therefore her ability to rise from a sitting position, needs and will benefit from a lift  chair  Hyperlipidemia LDL goal <100 Hyperlipidemia:Low fat diet discussed and encouraged.   Lipid Panel  Lab Results  Component Value Date   CHOL 216 (H) 12/27/2017   HDL 83 12/27/2017   LDLCALC 116 (H) 12/27/2017   TRIG 74 12/27/2017   CHOLHDL 2.6 12/27/2017   enocuraged t reduce fried and fatty foods    Stress at home Progressive deterioration in health of her spouse and ongoing challenge which she is doing the best that she can with

## 2017-12-30 NOTE — Patient Instructions (Addendum)
Wellness with nurse past due please schedule  Flu vaccinee today  Physical exam with MD in February  We will send for lift chair to thesupplier who accepts your ins (Advanced)  Careful not to fall  All the best  Thank you  for choosing [facility]. We consider it a privelige to serve you.  Delivering excellent health care in a caring and  compassionate way is our goal.  Partnering with you,  so that together we can achieve this goal is our strategy.

## 2018-01-03 ENCOUNTER — Other Ambulatory Visit: Payer: Self-pay | Admitting: Family Medicine

## 2018-01-03 ENCOUNTER — Telehealth: Payer: Self-pay

## 2018-01-03 DIAGNOSIS — R32 Unspecified urinary incontinence: Secondary | ICD-10-CM

## 2018-01-03 DIAGNOSIS — M5387 Other specified dorsopathies, lumbosacral region: Secondary | ICD-10-CM

## 2018-01-03 NOTE — Assessment & Plan Note (Signed)
States worsened, and  intends to schedule appt for f/u with Urologist who has evaluated her in the past, will refer

## 2018-01-03 NOTE — Telephone Encounter (Signed)
DME for power lift chair entered.

## 2018-01-03 NOTE — Assessment & Plan Note (Signed)
Increasing limitation in mobility with progressive spinal stenosis and disc disease with nerve compression noted on MRI of 09/2017. Marked difficulty in raising from a sitting position, and will benefit from a lift chair. Currently being evaluated  By neurosurgery and as a follow up neurology for best management approach to lessen her debility if possible

## 2018-01-03 NOTE — Assessment & Plan Note (Signed)
Hyperlipidemia:Low fat diet discussed and encouraged.   Lipid Panel  Lab Results  Component Value Date   CHOL 216 (H) 12/27/2017   HDL 83 12/27/2017   LDLCALC 116 (H) 12/27/2017   TRIG 74 12/27/2017   CHOLHDL 2.6 12/27/2017   enocuraged t reduce fried and fatty foods

## 2018-01-03 NOTE — Assessment & Plan Note (Signed)
Progressive deterioration in health of her spouse and ongoing challenge which she is doing the best that she can with

## 2018-01-03 NOTE — Assessment & Plan Note (Signed)
Controlled, no change in medication DASH diet and commitment to daily physical activity for a minimum of 30 minutes discussed and encouraged, as a part of hypertension management. The importance of attaining a healthy weight is also discussed.  BP/Weight 12/30/2017 11/18/2017 10/05/2017 07/29/2017 04/22/2017 0/03/2391 07/29/4088  Systolic BP 502 561 548 845 733 448 301  Diastolic BP 70 79 66 89 70 80 80  Wt. (Lbs) 150.12 152.4 - 145.6 155 158.12 158  BMI 24.23 24.6 - 23.5 25.02 25.52 25.5

## 2018-01-03 NOTE — Assessment & Plan Note (Signed)
severe arthritis limiting ability to safely weight bear and therefore her ability to rise from a sitting position, needs and will benefit from a lift chair

## 2018-01-12 ENCOUNTER — Ambulatory Visit (INDEPENDENT_AMBULATORY_CARE_PROVIDER_SITE_OTHER): Payer: Medicare HMO

## 2018-01-12 VITALS — BP 132/78 | HR 86 | Resp 12 | Ht 66.0 in | Wt 152.0 lb

## 2018-01-12 DIAGNOSIS — Z0000 Encounter for general adult medical examination without abnormal findings: Secondary | ICD-10-CM | POA: Diagnosis not present

## 2018-01-12 NOTE — Progress Notes (Signed)
Subjective:   KYIRA VOLKERT is a 84 y.o. female who presents for Medicare Annual (Subsequent) preventive examination.  Review of Systems:   Cardiac Risk Factors include: hypertension;advanced age (>33mn, >>66women)     Objective:     Vitals: BP 132/78   Pulse 86   Resp 12   Ht 5' 6"  (1.676 m)   Wt 152 lb (68.9 kg)   SpO2 100%   BMI 24.53 kg/m   Body mass index is 24.53 kg/m.  Advanced Directives 01/12/2018 09/28/2016 03/21/2016 03/20/2016 01/15/2016 07/18/2014 10/17/2012  Does Patient Have a Medical Advance Directive? Yes Yes No Yes Yes No;Yes Patient does not have advance directive  Type of Advance Directive Living will [place] will Healthcare Power of [place] will [place]Living will [place]Living will -  Does patient want to make changes to medical advance directive? No - Patient declined No - Patient declined - - No - Patient declined - -  Copy of [place] Chart? - No - copy requested - - No - copy requested No - copy requested -    Tobacco Social History   Tobacco Use  Smoking Status Never Smoker  Smokeless Tobacco Never Used  Tobacco Comment   Never smoked     Counseling given: Not Answered Comment: Never smoked   Clinical Intake:  Pre-visit preparation completed: Yes  Pain : 0-10 Pain Score: 6  Pain Type: Chronic pain Pain Location: Back Pain Orientation: Mid, Lower Pain Radiating Towards: right leg  Pain Descriptors / Indicators: Aching, Radiating Pain Onset: More than a month ago Pain Frequency: Intermittent Pain Relieving Factors: Aleve  Pain Relieving Factors: Aleve  BMI - recorded: 24.5 Nutritional Status: BMI of 19-24  Normal Nutritional Risks: None Diabetes: No  How often do you need to have someone help you when you read instructions, pamphlets, or other written materials from your doctor or pharmacy?: 2 -  Rarely What is the last grade level you completed in school?: 12 grade   Interpreter Needed?: No  Information entered by :: AFrancena HanlyLPN  Past Medical History:  Diagnosis Date  . Arthritis    spinal stenosis  . Elevated LFTs    secondary to fatty liver, negative work-up in 2011  . Hypercholesterolemia   . Hypertension   . Osteoarthritis    right knee  . PONV (postoperative nausea and vomiting)   . [place]   grade 1, no recurrence up to 2013   Past Surgical History:  Procedure Laterality Date  . 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 APPENDECTOMY  1973  . BREAST BIOPSY Left 2018   Benign  . CHOLECYSTECTOMY  1973  . COLONOSCOPY    06/21/2007   RGNO:IBBCWUrectum/Sigmoid diverticula, diminutive hepatic flexure polyp s/p bx. Benign.   . COLONOSCOPY N/A 10/17/2012   Dr. FOneida Alar moderate diverticulosis was noted in the sigmoid colon, moderate sized internal hemorrhoids. next tcs in10 years  . ESOPHAGOGASTRODUODENOSCOPY N/A 03/20/2016   Dr. FOneida Alar widely patent Schatzki ring, anemia due to ASA induced erosive gastritis, mild duodenitis. gastric bx benign without H.pylori.   .Marland KitchenFOOT SURGERY  2007   Pins in toes on left foot, 5 to 6 yrs ago  . GIVENS CAPSULE STUDY N/A 03/20/2016   Procedure: GIVENS CAPSULE STUDY;  Surgeon: SDanie Binder MD;  [facility];  Service: Endoscopy;  Laterality: N/A;  .  TONSILLECTOMY     Family History  Problem Relation Age of Onset  . Heart disease Father   . Bladder Cancer Brother        in remission   . Hypertension Sister   . Dementia Sister   . Diabetes type II Sister   . Hypertension Sister   . Aneurysm Sister   . Hypertension Brother   . Hypertension Son   . Colon cancer Neg Hx    Social History   Socioeconomic History  . Marital status: Married    Spouse name: Jeneen Rinks   . Number of children: 3  . Years of education: 37  . Highest education level: 12th grade  Occupational  History  . Occupation: retired   Scientific laboratory technician  . Financial resource strain: Not hard at all  . Food insecurity:    Worry: Never true    Inability: Never true  . Transportation needs:    Medical: No    Non-medical: No  Tobacco Use  . Smoking status: Never Smoker  . Smokeless tobacco: Never Used  . Tobacco comment: Never smoked  Substance and Sexual Activity  . Alcohol use: No  . Drug use: No  . Sexual activity: Not Currently    Birth control/protection: Post-menopausal, Surgical  Lifestyle  . Physical activity:    Days per week: 0 days    Minutes per session: 0 min  . Stress: To some extent  Relationships  . Social connections:    Talks on phone: More than three times a week    Gets together: Once a week    Attends religious service: 1 to 4 times per year    Active member of club or organization: Yes    Attends meetings of clubs or organizations: Never    Relationship status: Married  Other Topics Concern  . Not on file  Social History Narrative  . Not on file    Outpatient Encounter Medications as of 01/12/2018  Medication Sig  . aspirin 81 MG tablet Take 81 mg by mouth as needed.   Marland Kitchen atorvastatin (LIPITOR) 10 MG tablet TAKE 1 TABLET EVERY DAY (Patient taking differently: TAKE 1 TABLET EVERY DAY. Takes twice a week)  . cholecalciferol (VITAMIN D) 1000 units tablet Take 1,000 Units by mouth daily.  . Chromium-Cinnamon [id number] MCG-MG CAPS Take 2 tablets by mouth daily.  Marland Kitchen linaclotide (LINZESS) 145 MCG CAPS capsule 1 PO with YOUR FIRST meal  . Misc Natural Products (OSTEO BI-FLEX JOINT SHIELD) TABS Take 1 tablet by mouth daily.  Marland Kitchen triamterene-hydrochlorothiazide (MAXZIDE) 75-50 MG tablet TAKE 1 TABLET EVERY DAY  . linaclotide (LINZESS) 72 MCG capsule Take 1 capsule (72 mcg total) by mouth daily before breakfast. (Patient not taking: Reported on 01/12/2018)   No facility-administered encounter medications on file as of 01/12/2018.     Activities of Daily Living In  your present state of health, do you have any difficulty performing the following activities: 01/12/2018  Hearing? N  Vision? Y  Difficulty concentrating or making decisions? N  Walking or climbing stairs? Y  Dressing or bathing? Y  Doing errands, shopping? Y  Preparing Food and eating ? Y  Using the Toilet? N  In the past six months, have you accidently leaked urine? Y  Do you have problems with loss of bowel control? N  Managing your Medications? N  Managing your Finances? N  Housekeeping or managing your Housekeeping? Y  Some recent data might be hidden    Patient Care Team: Moshe Cipro,  Norwood Levo, MD as PCP - General Carole Civil, MD as Consulting Physician (Orthopedic Surgery) Irine Seal, MD as Attending Physician (Urology) Danie Binder, MD as Consulting Physician (Gastroenterology)    Assessment:   This is a routine wellness examination for Zeta.  Exercise Activities and Dietary recommendations Current Exercise Habits: The patient does not participate in regular exercise at present, Exercise limited by: orthopedic condition(s)  Goals    . DIET - INCREASE WATER INTAKE    . Exercise 3x per week (30 min per time)     Recommend starting a routine exercise program at least 3 days a week for 30-45 minutes at a time as tolerated.         Fall Risk Fall Risk  01/12/2018 12/30/2017 10/05/2017 04/22/2017 09/28/2016  Falls in the past year? Yes No Yes No Yes  Number falls in past yr: 1 - 1 - 1  Injury with Fall? No - No - No  Comment - - - - reveals patient accidentally stumbled  Risk for fall due to : Impaired balance/gait;Impaired mobility;Medication side effect - - Impaired balance/gait Impaired balance/gait  Follow up Falls prevention discussed - - - Falls evaluation completed;Education provided;Falls prevention discussed   Is the patient's home free of loose throw rugs in walkways, pet beds, electrical cords, etc?   no      Grab bars in the bathroom? yes       Handrails on the stairs?   yes      Adequate lighting?   yes  Timed Get Up and Go performed: Patient is able to perform in 12 seconds with the aid of a cane   Depression Screen PHQ 2/9 Scores 01/12/2018 12/30/2017 10/05/2017 09/28/2016  PHQ - 2 Score 2 0 0 0  PHQ- 9 Score 5 - - -     Cognitive Function     6CIT Screen 01/12/2018 09/28/2016  What Year? 0 points 0 points  What month? 0 points 0 points  What time? 0 points 0 points  Count back from 20 0 points 0 points  Months in reverse 0 points 0 points  Repeat phrase 0 points 0 points  Total Score 0 0    Immunization History  Administered Date(s) Administered  . Influenza Split 12/22/2012, 01/04/2014, 12/13/2014  . Influenza Whole 01/21/2007, 05/10/2008, 12/11/2008, 12/06/2009  . Influenza, High Dose Seasonal PF 12/30/2017  . Influenza,inj,Quad PF,6+ Mos 04/22/2017  . Influenza-Unspecified 11/21/2013, 12/20/2015  . Pneumococcal Conjugate-13 11/02/2013  . Pneumococcal Polysaccharide-23 08/15/2010  . Td 08/31/2007  . Zoster 08/31/2006    Qualifies for Shingles Vaccine? Up to date   Screening Tests Health Maintenance  Topic Date Due  . TETANUS/TDAP  12/31/2018 (Originally 08/30/2017)  . INFLUENZA VACCINE  Completed  . DEXA SCAN  Completed  . PNA vac Low Risk Adult  Completed    Cancer Screenings: Lung: Low Dose CT Chest recommended if Age 40-80 years, 30 pack-year currently smoking OR have quit w/in 15years. Patient does not qualify. Breast:  Up to date on Mammogram? Yes   Up to date of Bone Density/Dexa? Yes Colorectal: up to date   Additional Screenings:  Hepatitis C Screening: complete     Plan:   Patient will follow up with neurology Dr. Jannifer Franklin to inquire about lower back pain, plans to continue with exercise program when able to.   I have personally reviewed and noted the following in the patient's chart:   . Medical and social history . Use of alcohol, tobacco or  illicit drugs  . Current medications and  supplements . Functional ability and status . Nutritional status . Physical activity . Advanced directives . List of other physicians . Hospitalizations, surgeries, and ER visits in previous 12 months . Vitals . Screenings to include cognitive, depression, and falls . Referrals and appointments  In addition, I have reviewed and discussed with patient certain preventive protocols, quality metrics, and best practice recommendations. A written personalized care plan for preventive services as well as general preventive health recommendations were provided to patient.     Francoise Schaumann, LPN  04/54/0981

## 2018-01-12 NOTE — Patient Instructions (Signed)
[name] , Thank you for taking time to come for your Medicare Wellness Visit. I appreciate your ongoing commitment to your health goals. Please review the following plan we discussed and let me know if I can assist you in the future.   Screening recommendations/referrals: Colonoscopy: up to date  Mammogram: up to date  Bone Density: postpone  Recommended yearly ophthalmology/optometry visit for glaucoma screening and checkup Recommended yearly dental visit for hygiene and checkup  Vaccinations: Influenza vaccine: up to date  Pneumococcal vaccine: up to date  Tdap vaccine: up to date  Shingles vaccine: up to date   Advanced directives: in place- copy requested   Conditions/risks identified: impaired mobility using cane   Next appointment: wellness in one year    Preventive Care 30 Years and Older, Female Preventive care refers to lifestyle choices and visits with your health care provider that can promote health and wellness. What does preventive care include?  A yearly physical exam. This is also called an annual well check.  Dental exams once or twice a year.  Routine eye exams. Ask your health care provider how often you should have your eyes checked.  Personal lifestyle choices, including:  Daily care of your teeth and gums.  Regular physical activity.  Eating a healthy diet.  Avoiding tobacco and drug use.  Limiting alcohol use.  Practicing safe sex.  Taking low-dose aspirin every day.  Taking vitamin and mineral supplements as recommended by your health care provider. What happens during an annual well check? The services and screenings done by your health care provider during your annual well check will depend on your age, overall health, lifestyle risk factors, and family history of disease. Counseling  Your health care provider may ask you questions about your:  Alcohol use.  Tobacco use.  Drug use.  Emotional well-being.  Home and relationship  well-being.  Sexual activity.  Eating habits.  History of falls.  Memory and ability to understand (cognition).  Work and work Statistician.  Reproductive health. Screening  You may have the following tests or measurements:  Height, weight, and BMI.  Blood pressure.  Lipid and cholesterol levels. These may be checked every 5 years, or more frequently if you are over 21 years old.  Skin check.  Lung cancer screening. You may have this screening every year starting at age 39 if you have a 30-pack-year history of smoking and currently smoke or have quit within the past 15 years.  Fecal occult blood test (FOBT) of the stool. You may have this test every year starting at age 93.  Flexible sigmoidoscopy or colonoscopy. You may have a sigmoidoscopy every 5 years or a colonoscopy every 10 years starting at age 46.  Hepatitis C blood test.  Hepatitis B blood test.  Sexually transmitted disease (STD) testing.  Diabetes screening. This is done by checking your blood sugar (glucose) after you have not eaten for a while (fasting). You may have this done every 1-3 years.  Bone density scan. This is done to screen for osteoporosis. You may have this done starting at age 45.  Mammogram. This may be done every 1-2 years. Talk to your health care provider about how often you should have regular mammograms. Talk with your health care provider about your test results, treatment options, and if necessary, the need for more tests. Vaccines  Your health care provider may recommend certain vaccines, such as:  Influenza vaccine. This is recommended every year.  Tetanus, diphtheria, and acellular pertussis (  Tdap, Td) vaccine. You may need a Td booster every 10 years.  Zoster vaccine. You may need this after age 14.  Pneumococcal 13-valent conjugate (PCV13) vaccine. One dose is recommended after age 82.  Pneumococcal polysaccharide (PPSV23) vaccine. One dose is recommended after age  18. Talk to your health care provider about which screenings and vaccines you need and how often you need them. This information is not intended to replace advice given to you by your health care provider. Make sure you discuss any questions you have with your health care provider. Document Released: 04/05/2015 Document Revised: 11/27/2015 Document Reviewed: 01/08/2015 Elsevier Interactive Patient Education  2017 [place] Prevention in the Home Falls can cause injuries. They can happen to people of all ages. There are many things you can do to make your home safe and to help prevent falls. What can I do on the outside of my home?  Regularly fix the edges of walkways and driveways and fix any cracks.  Remove anything that might make you trip as you walk through a door, such as a raised step or threshold.  Trim any bushes or trees on the path to your home.  Use bright outdoor lighting.  Clear any walking paths of anything that might make someone trip, such as rocks or tools.  Regularly check to see if handrails are loose or broken. Make sure that both sides of any steps have handrails.  Any raised decks and porches should have guardrails on the edges.  Have any leaves, snow, or ice cleared regularly.  Use sand or salt on walking paths during winter.  Clean up any spills in your garage right away. This includes oil or grease spills. What can I do in the bathroom?  Use night lights.  Install grab bars by the toilet and in the tub and shower. Do not use towel bars as grab bars.  Use non-skid mats or decals in the tub or shower.  If you need to sit down in the shower, use a plastic, non-slip stool.  Keep the floor dry. Clean up any water that spills on the floor as soon as it happens.  Remove soap buildup in the tub or shower regularly.  Attach bath mats securely with double-sided non-slip rug tape.  Do not have throw rugs and other things on the floor that can make  you trip. What can I do in the bedroom?  Use night lights.  Make sure that you have a light by your bed that is easy to reach.  Do not use any sheets or blankets that are too big for your bed. They should not hang down onto the floor.  Have a firm chair that has side arms. You can use this for support while you get dressed.  Do not have throw rugs and other things on the floor that can make you trip. What can I do in the kitchen?  Clean up any spills right away.  Avoid walking on wet floors.  Keep items that you use a lot in easy-to-reach places.  If you need to reach something above you, use a strong step stool that has a grab bar.  Keep electrical cords out of the way.  Do not use floor polish or wax that makes floors slippery. If you must use wax, use non-skid floor wax.  Do not have throw rugs and other things on the floor that can make you trip. What can I do with my stairs?  Do  not leave any items on the stairs.  Make sure that there are handrails on both sides of the stairs and use them. Fix handrails that are broken or loose. Make sure that handrails are as long as the stairways.  Check any carpeting to make sure that it is firmly attached to the stairs. Fix any carpet that is loose or worn.  Avoid having throw rugs at the top or bottom of the stairs. If you do have throw rugs, attach them to the floor with carpet tape.  Make sure that you have a light switch at the top of the stairs and the bottom of the stairs. If you do not have them, ask someone to add them for you. What else can I do to help prevent falls?  Wear shoes that:  Do not have high heels.  Have rubber bottoms.  Are comfortable and fit you well.  Are closed at the toe. Do not wear sandals.  If you use a stepladder:  Make sure that it is fully opened. Do not climb a closed stepladder.  Make sure that both sides of the stepladder are locked into place.  Ask someone to hold it for you, if  possible.  Clearly mark and make sure that you can see:  Any grab bars or handrails.  First and last steps.  Where the edge of each step is.  Use tools that help you move around (mobility aids) if they are needed. These include:  Canes.  Walkers.  Scooters.  Crutches.  Turn on the lights when you go into a dark area. Replace any light bulbs as soon as they burn out.  Set up your furniture so you have a clear path. Avoid moving your furniture around.  If any of your floors are uneven, fix them.  If there are any pets around you, be aware of where they are.  Review your medicines with your doctor. Some medicines can make you feel dizzy. This can increase your chance of falling. Ask your doctor what other things that you can do to help prevent falls. This information is not intended to replace advice given to you by your health care provider. Make sure you discuss any questions you have with your health care provider. Document Released: 01/03/2009 Document Revised: 08/15/2015 Document Reviewed: 04/13/2014 Elsevier Interactive Patient Education  2017 [employer].

## 2018-01-14 ENCOUNTER — Other Ambulatory Visit: Payer: Self-pay

## 2018-01-14 ENCOUNTER — Encounter: Payer: Self-pay | Admitting: Neurology

## 2018-01-14 ENCOUNTER — Ambulatory Visit: Payer: Medicare HMO | Admitting: Neurology

## 2018-01-14 VITALS — BP 122/60 | HR 80 | Ht 66.0 in | Wt 150.5 lb

## 2018-01-14 DIAGNOSIS — M48062 Spinal stenosis, lumbar region with neurogenic claudication: Secondary | ICD-10-CM | POA: Diagnosis not present

## 2018-01-14 DIAGNOSIS — M6281 Muscle weakness (generalized): Secondary | ICD-10-CM | POA: Diagnosis not present

## 2018-01-14 DIAGNOSIS — M48061 Spinal stenosis, lumbar region without neurogenic claudication: Secondary | ICD-10-CM | POA: Insufficient documentation

## 2018-01-14 HISTORY — DX: Spinal stenosis, lumbar region without neurogenic claudication: M48.061

## 2018-01-14 NOTE — Progress Notes (Signed)
Reason for visit: Leg weakness  Referring physician: Dr. Turner Daniels is a 84 y.o. female  History of present illness:  [name] [name] is an 84 year old right-handed black female with a history of lumbar spinal stenosis at the L3-4 and L4-5 levels, she is followed by Dr. Sherwood Gambler.  The patient has had gradually progressive weakness of both legs over the last 3 to 4 years, within the last 6 to 8 months she has had to use a walker for ambulation.  The patient has had occasional falls, she has not injured herself.  She reports when she stands up she has some back pain and some discomfort into both legs, she is limited in how far she can walk.  When she tries to sit down, she notices that she will plop down and she cannot control her rate of fall.  The patient has had chronic issues controlling the bladder, she has had uterine surgery 11 years ago and she has had difficulty with bladder control since that time.  She denies any neck pain or pain down the arms, since she has had to push herself off the chair to stand up, she has developed bilateral shoulder pain.  The patient reports no numbness in the legs with exception that she may have some occasional numbness below the knee on the left to the ankle.  The patient has a history of a fatty liver, she has chronically elevated liver function tests, she is also on Lipitor.  The patient was seen by Dr. Sherwood Gambler for an evaluation, he has referred this patient for further evaluation of the leg weakness.  The patient does have relatively severe spinal stenosis at the L3-4 and L4-5 levels.   Past Medical History:  Diagnosis Date  . Arthritis    spinal stenosis  . Elevated LFTs    secondary to fatty liver, negative work-up in 2011  . Hypercholesterolemia   . Hypertension   . Lumbar spinal stenosis 01/14/2018   L3-4 and L4-5  . Osteoarthritis    right knee  . PONV (postoperative nausea and vomiting)   . Uterine cancer ([place]) 08/2006   grade 1,  no recurrence up to 2013    Past Surgical History:  Procedure Laterality Date  . 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 APPENDECTOMY  1973  . BREAST BIOPSY Left 2018   Benign  . CHOLECYSTECTOMY  1973  . COLONOSCOPY    06/21/2007   EBR:AXENMM rectum/Sigmoid diverticula, diminutive hepatic flexure polyp s/p bx. Benign.   . COLONOSCOPY N/A 10/17/2012   Dr. Oneida Alar- moderate diverticulosis was noted in the sigmoid colon, moderate sized internal hemorrhoids. next tcs in10 years  . ESOPHAGOGASTRODUODENOSCOPY N/A 03/20/2016   Dr. Oneida Alar, widely patent Schatzki ring, anemia due to ASA induced erosive gastritis, mild duodenitis. gastric bx benign without H.pylori.   Marland Kitchen FOOT SURGERY  2007   Pins in toes on left foot, 5 to 6 yrs ago  . GIVENS CAPSULE STUDY N/A 03/20/2016   Procedure: GIVENS CAPSULE STUDY;  Surgeon: Danie Binder, MD;  Location: [facility];  Service: Endoscopy;  Laterality: N/A;  . TONSILLECTOMY      Family History  Problem Relation Age of Onset  . Heart disease Father   . Bladder Cancer Brother        in remission   . Hypertension Sister   . Dementia Sister   . Diabetes type II Sister   . Hypertension Sister   .  Aneurysm Sister   . Hypertension Brother   . Hypertension Son   . Colon cancer Neg Hx     Social history:  reports that she has never smoked. She has never used smokeless tobacco. She reports that she does not drink alcohol or use drugs.  Medications:  Prior to Admission medications   Medication Sig Start Date End Date Taking? Authorizing Provider  aspirin 81 MG tablet Take 81 mg by mouth as needed.    Yes [provider]  atorvastatin (LIPITOR) 10 MG tablet TAKE 1 TABLET EVERY DAY Patient taking differently: TAKE 1 TABLET EVERY DAY. Takes twice a week 01/12/17  Yes Fayrene Helper, MD  cholecalciferol (VITAMIN D) 1000 units tablet Take 1,000 Units by mouth daily.   Yes [provider]  Chromium-Cinnamon [phone]  MCG-MG CAPS Take 2 tablets by mouth daily.   Yes [provider]  linaclotide Rolan Lipa) 145 MCG CAPS capsule 1 PO with YOUR FIRST meal 11/18/17  Yes Fields, Marga Melnick, MD  linaclotide [facility]) 72 MCG capsule Take 1 capsule (72 mcg total) by mouth daily before breakfast. 07/29/17  Yes Mahala Menghini, PA-C  Misc Natural Products (OSTEO BI-FLEX JOINT SHIELD) TABS Take 1 tablet by mouth daily.   Yes [provider]  triamterene-hydrochlorothiazide (MAXZIDE) 75-50 MG tablet TAKE 1 TABLET EVERY DAY 04/26/17  Yes Fayrene Helper, MD      Allergies  Allergen Reactions  . Ciprofloxacin Hcl     Chills, sick, could not tolerate it  . Hydrocodone Nausea Only  . Penicillins     ROS:  Out of a complete 14 system review of symptoms, the patient complains only of the following symptoms, and all other reviewed systems are negative.  Swelling in the legs Moles Incontinence of the bowel, constipation Incontinence of the bladder Joint pain, joint swelling Numbness Restless legs  Blood pressure 122/60, pulse 80, height 5' 6"  (1.676 m), weight 150 lb 8 oz (68.3 kg).  Physical Exam  General: The patient is alert and cooperative at the time of the examination.  Eyes: Pupils are equal, round, and reactive to light. Discs are flat bilaterally.  Neck: The neck is supple, no carotid bruits are noted.  Respiratory: The respiratory examination is clear.  Cardiovascular: The cardiovascular examination reveals a regular rate and rhythm, no obvious murmurs or rubs are noted.  Skin: Extremities are with 2+ edema below the knees bilaterally.  Neurologic Exam  Mental status: The patient is alert and oriented x 3 at the time of the examination. The patient has apparent normal recent and remote memory, with an apparently normal attention span and concentration ability.  Cranial nerves: Facial symmetry is present. There is good sensation of the face to pinprick and soft touch bilaterally.  The strength of the facial muscles and the muscles to head turning and shoulder shrug are normal bilaterally. Speech is well enunciated, no aphasia or dysarthria is noted. Extraocular movements are full. Visual fields are full. The tongue is midline, and the patient has symmetric elevation of the soft palate. No obvious hearing deficits are noted.  Motor: The motor testing reveals 4/5 strength with the deltoid muscles, biceps, and triceps muscles bilaterally.  The patient has 3/5 strength with hip flexion bilaterally and with adduction, better strength with abduction of the thighs.  The patient has 4/5 strength with extension the knees bilaterally, she has somewhat better hamstring strength bilaterally.  No evidence of a foot drop was seen.  Sensory: Sensory testing is intact  to pinprick, soft touch, vibration sensation, and position sense on all 4 extremities. No evidence of extinction is noted.  Coordination: Cerebellar testing reveals good finger-nose finger bilaterally.  The patient is not able to perform heel-to-shin on either side.  Gait and station: Gait is slightly wide-based, the patient can walk independently but usually uses a walker.  Tandem gait was not attempted.  Romberg is negative.  Reflexes: Deep tendon reflexes are symmetric and normal bilaterally, with exception of depressed ankle jerk reflexes bilaterally. Toes are downgoing bilaterally.   Assessment/Plan:  1.  Weakness, all 4 extremities  2.  Lumbar spinal stenosis, L3-4 and L4-5  The lumbar spinal stenosis does not explain the generalized weakness.  The patient is on Lipitor, she also chronically runs elevated liver enzyme levels, the patient will need to be evaluated for an underlying myopathy.  A cervical myelopathy does need to be considered as well.  The patient will be set up for blood work today.  She will have nerve conduction studies on both legs and one arm, EMG study on one arm and one leg.  She will follow-up for  the EMG, she will come back for evaluation in 4 months.  [name] Alexanders MD 01/14/2018 12:00 PM  [facility] [address] [place], [place] [address]  Phone [phone] Fax [phone]

## 2018-01-17 ENCOUNTER — Telehealth: Payer: Self-pay | Admitting: Neurology

## 2018-01-17 LAB — RHEUMATOID FACTOR: Rhuematoid fact SerPl-aCnc: 11.5 IU/mL (ref 0.0–13.9)

## 2018-01-17 LAB — ACETYLCHOLINE RECEPTOR, BINDING

## 2018-01-17 LAB — CK: CK TOTAL: 955 U/L — AB (ref 24–173)

## 2018-01-17 LAB — ANA W/REFLEX: ANA: NEGATIVE

## 2018-01-17 LAB — SEDIMENTATION RATE: Sed Rate: 19 mm/hr (ref 0–40)

## 2018-01-17 LAB — ANGIOTENSIN CONVERTING ENZYME: Angio Convert Enzyme: 72 U/L (ref 14–82)

## 2018-01-17 NOTE — Telephone Encounter (Signed)
I called the patient.  Blood work was unremarkable except that the muscle enzyme level was elevated, this may be the source of the elevated liver enzyme testing that was attributed to fatty liver.  The patient will be seen back for EMG and nerve conduction study evaluation.

## 2018-02-10 ENCOUNTER — Other Ambulatory Visit: Payer: Self-pay | Admitting: *Deleted

## 2018-02-11 MED ORDER — LINACLOTIDE 145 MCG PO CAPS: ORAL_CAPSULE | ORAL | 3 refills | Status: DC

## 2018-02-15 ENCOUNTER — Ambulatory Visit: Payer: Medicare HMO | Admitting: Neurology

## 2018-02-15 ENCOUNTER — Encounter: Payer: Self-pay | Admitting: Neurology

## 2018-02-15 ENCOUNTER — Ambulatory Visit (INDEPENDENT_AMBULATORY_CARE_PROVIDER_SITE_OTHER): Payer: Medicare HMO | Admitting: Neurology

## 2018-02-15 DIAGNOSIS — G7249 Other inflammatory and immune myopathies, not elsewhere classified: Secondary | ICD-10-CM | POA: Insufficient documentation

## 2018-02-15 DIAGNOSIS — G729 Myopathy, unspecified: Secondary | ICD-10-CM | POA: Diagnosis not present

## 2018-02-15 DIAGNOSIS — M48062 Spinal stenosis, lumbar region with neurogenic claudication: Secondary | ICD-10-CM

## 2018-02-15 DIAGNOSIS — M6281 Muscle weakness (generalized): Secondary | ICD-10-CM

## 2018-02-15 HISTORY — DX: Myopathy, unspecified: G72.9

## 2018-02-15 NOTE — Progress Notes (Signed)
Please refer to EMG and nerve conduction procedure note.  

## 2018-02-15 NOTE — Procedures (Signed)
HISTORY:  [name] [name] is an 84 year old patient with a gradual progressive problem with weakness of all 4 extremities.  The patient is on a statin drug currently.  She is being evaluated for a possible myopathic disorder.  NERVE CONDUCTION STUDIES:  Nerve conduction studies were performed on the right upper extremity.  The distal motor latencies for the median and ulnar nerves were slightly prolonged with normal motor amplitudes for these nerves.  The nerve conduction velocities for the right ulnar nerve were normal below the elbow and slowed above the elbow, normal for the right median nerve.  The sensory latencies for the right median and ulnar nerves were prolonged.  The F-wave latency for the right ulnar nerve was slightly prolonged.  Nerve conduction studies were performed on both lower extremities.  The distal motor latencies for the peroneal nerves were normal bilaterally with low motor amplitudes seen for these nerves bilaterally.  The distal motor latencies for the posterior tibial nerves were normal on the left and prolonged on the right with low motor amplitudes bilaterally.  Slowing was seen for the posterior tibial nerves bilaterally, normal for the peroneal nerves bilaterally.  The sural sensory latencies were normal on the left, prolonged on the right and normal for the peroneal nerves bilaterally.  The F-wave latencies for the posterior tibial nerves were prolonged bilaterally.  EMG STUDIES:  EMG study was performed on the right upper extremity:  The first dorsal interosseous muscle reveals 1 to 3 K units with full recruitment. No fibrillations or positive waves were noted. The abductor pollicis brevis muscle reveals 1 to 3 K units with decreased recruitment. No fibrillations or positive waves were noted. The extensor indicis proprius muscle reveals 0.5 to 1 K units with full recruitment. No fibrillations or positive waves were noted. The biceps muscle reveals 0.5 to 1 K  units with full recruitment. No fibrillations or positive waves were noted. The triceps muscle reveals 1 to 2 K units with full recruitment. No fibrillations or positive waves were noted. The anterior deltoid muscle reveals 1 to 2 K units with full recruitment. No fibrillations or positive waves were noted. The cervical paraspinal muscles were tested at 2 levels. No abnormalities of insertional activity were seen at either level tested. There was good relaxation.  EMG study was performed on the right lower extremity:  The tibialis anterior muscle reveals 1 to 2 K motor units with full recruitment. No fibrillations or positive waves were seen. The peroneus tertius muscle reveals 0.5 to 1 K motor units with full recruitment. No fibrillations or positive waves were seen.  1+ fasciculations were seen. The medial gastrocnemius muscle reveals 0.5 to 1 K motor units with full recruitment. No fibrillations or positive waves were seen. The vastus lateralis muscle reveals 1 to 2 K motor units with full recruitment. No fibrillations or positive waves were seen. The iliopsoas muscle reveals 1 to 2K motor units with full recruitment. No fibrillations or positive waves were seen. The lumbosacral paraspinal muscles were tested at 3 levels, and revealed no abnormalities of insertional activity at all 3 levels tested. There was good relaxation.   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  There appears to be an overlying mild right ulnar neuropathy at the elbow and a mild right carpal tunnel syndrome.  EMG evaluation of the right upper and right lower extremities shows evidence of  myopathic changes in the muscle.  No active denervation is seen.  If a muscle biopsy is contemplated, would recommend the vastus lateralis muscle on the left.  Jill Alexanders MD 02/15/2018 1:49  PM  [facility] [address] [place], [place] [address]  Phone [phone] Fax [phone]

## 2018-02-15 NOTE — Progress Notes (Addendum)
The patient comes in for EMG nerve conduction study evaluation.  This study shows evidence of a generalized peripheral neuropathy, but the patient also has myopathic changes in the muscle.  The patient will be sent for further blood work today, she will undergo a muscle biopsy, I will make referral to general surgery.     [name] [name]    Nerve / Sites Muscle Latency Ref. Amplitude Ref. Rel Amp Segments Distance Velocity Ref. Area    ms ms mV mV %  cm m/s m/s mVms  R Median - APB     Wrist APB 4.4 ?4.4 5.0 ?4.0 100 Wrist - APB 7   17.5     Upper arm APB 9.4  4.9  97 Upper arm - Wrist 25 50 ?49 17.4  R Ulnar - ADM     Wrist ADM 3.8 ?3.3 6.4 ?6.0 100 Wrist - ADM 7   26.8     B.Elbow ADM 7.9  6.2  97.2 B.Elbow - Wrist 20 49 ?49 25.2     A.Elbow ADM 10.2  5.4  86.7 A.Elbow - B.Elbow 10 44 ?49 24.3         A.Elbow - Wrist      L Peroneal - EDB     Ankle EDB 5.6 ?6.5 0.4 ?2.0 100 Ankle - EDB 9   1.5     Fib head EDB 12.2  0.3  79.8 Fib head - Ankle 30 45 ?44 1.7     Pop fossa EDB 14.5  0.3  98.1 Pop fossa - Fib head 10 45 ?44 1.4         Pop fossa - Ankle      R Peroneal - EDB     Ankle EDB 6.2 ?6.5 0.7 ?2.0 100 Ankle - EDB 9   2.0     Fib head EDB 13.0  0.6  86.4 Fib head - Ankle 30 44 ?44 1.6     Pop fossa EDB 15.3  0.7  102 Pop fossa - Fib head 10 44 ?44 1.6         Pop fossa - Ankle      L Tibial - AH     Ankle AH 5.4 ?5.8 1.0 ?4.0 100 Ankle - AH 9   1.3     Pop fossa AH 15.5  0.7  69 Pop fossa - Ankle 38 38 ?41 1.0  R Tibial - AH     Ankle AH 6.1 ?5.8 1.7 ?4.0 100 Ankle - AH 9   5.6     Pop fossa AH 17.5  1.2  70.9 Pop fossa - Ankle 38 33 ?41 3.8                 SNC    Nerve / Sites Rec. Site Peak Lat Ref.  Amp Ref. Segments Distance    ms ms V V  cm  L Sural - Ankle (Calf)     Calf Ankle 4.0 ?4.4 4 ?6 Calf - Ankle 14  R Sural - Ankle (Calf)     Calf Ankle 4.9 ?4.4 6 ?6 Calf - Ankle 14  L Superficial peroneal - Ankle     Lat leg Ankle 3.9 ?4.4 3 ?6 Lat leg - Ankle 14  R Superficial  peroneal - Ankle     Lat leg Ankle 4.4 ?4.4 2 ?6 Lat leg - Ankle 14  R Median - Orthodromic (Dig II, Mid palm)     Dig II Wrist 3.7 ?3.4 10 ?10  Dig II - Wrist 13  R Ulnar - Orthodromic, (Dig V, Mid palm)     Dig V Wrist 3.6 ?3.1 6 ?5 Dig V - Wrist 27                 F  Wave    Nerve F Lat Ref.   ms ms  L Tibial - AH 57.1 ?56.0  R Tibial - AH 60.3 ?56.0  R Ulnar - ADM 32.5 ?32.0

## 2018-02-16 ENCOUNTER — Telehealth: Payer: Self-pay | Admitting: Neurology

## 2018-02-16 LAB — SPECIMEN STATUS

## 2018-02-16 LAB — SPECIMEN STATUS REPORT

## 2018-02-16 NOTE — Telephone Encounter (Signed)
[name] [name] with [facility] requesting a call back at [phone] ext [phone] stating she has a few questions regarding the pts orders.

## 2018-02-16 NOTE — Telephone Encounter (Signed)
I spoke to [place] at [place],  I told her that our [facility] tech, told us to place as miscellaneous lab, (since they do not have test listed).  They will [facility] (since I think [facility] is affilitated with [facility]).

## 2018-02-16 NOTE — Telephone Encounter (Signed)
I tried to call the number given, the extension is non-valid.

## 2018-02-16 NOTE — Telephone Encounter (Signed)
I spoke to [place] with [facility].  The miscellaneous test ordered (anti-HMGCRIGG) she needs to know what lab to send it to.  ([facility]).  Please advise.

## 2018-03-01 ENCOUNTER — Telehealth: Payer: Self-pay | Admitting: Neurology

## 2018-03-01 LAB — OTHER LAB TEST

## 2018-03-01 NOTE — Telephone Encounter (Signed)
The anti- HMGCR antibody level was negative.

## 2018-03-02 ENCOUNTER — Ambulatory Visit: Payer: Medicare HMO | Admitting: Urology

## 2018-03-02 ENCOUNTER — Other Ambulatory Visit (HOSPITAL_COMMUNITY)
Admission: AD | Admit: 2018-03-02 | Discharge: 2018-03-02 | Disposition: A | Discharge status: Home / Self Care | Payer: Medicare HMO | Source: Skilled Nursing Facility | Attending: Urology | Admitting: Urology

## 2018-03-02 DIAGNOSIS — N3941 Urge incontinence: Secondary | ICD-10-CM | POA: Diagnosis not present

## 2018-03-05 LAB — URINE CULTURE: Culture: >=100000 — AB

## 2018-03-11 DIAGNOSIS — Z6825 Body mass index (BMI) 25.0-25.9, adult: Secondary | ICD-10-CM | POA: Diagnosis not present

## 2018-03-11 DIAGNOSIS — R29898 Other symptoms and signs involving the musculoskeletal system: Secondary | ICD-10-CM | POA: Diagnosis not present

## 2018-03-11 DIAGNOSIS — M48062 Spinal stenosis, lumbar region with neurogenic claudication: Secondary | ICD-10-CM | POA: Diagnosis not present

## 2018-03-25 ENCOUNTER — Encounter: Payer: Self-pay | Admitting: *Deleted

## 2018-04-13 ENCOUNTER — Ambulatory Visit: Payer: Medicare HMO | Admitting: Urology

## 2018-04-13 DIAGNOSIS — N3941 Urge incontinence: Secondary | ICD-10-CM

## 2018-04-26 ENCOUNTER — Ambulatory Visit: Payer: Medicare HMO | Admitting: Neurology

## 2018-05-03 ENCOUNTER — Telehealth: Payer: Self-pay | Admitting: Neurology

## 2018-05-03 ENCOUNTER — Ambulatory Visit (INDEPENDENT_AMBULATORY_CARE_PROVIDER_SITE_OTHER): Payer: Medicare HMO | Admitting: Family Medicine

## 2018-05-03 ENCOUNTER — Encounter: Payer: Self-pay | Admitting: Gastroenterology

## 2018-05-03 ENCOUNTER — Encounter: Payer: Self-pay | Admitting: Family Medicine

## 2018-05-03 VITALS — BP 104/70 | HR 85 | Resp 15 | Ht 66.0 in | Wt 150.0 lb

## 2018-05-03 DIAGNOSIS — I10 Essential (primary) hypertension: Secondary | ICD-10-CM | POA: Diagnosis not present

## 2018-05-03 DIAGNOSIS — Z0000 Encounter for general adult medical examination without abnormal findings: Secondary | ICD-10-CM | POA: Diagnosis not present

## 2018-05-03 DIAGNOSIS — R1319 Other dysphagia: Secondary | ICD-10-CM

## 2018-05-03 DIAGNOSIS — G729 Myopathy, unspecified: Secondary | ICD-10-CM

## 2018-05-03 DIAGNOSIS — K7581 Nonalcoholic steatohepatitis (NASH): Secondary | ICD-10-CM

## 2018-05-03 DIAGNOSIS — M40204 Unspecified kyphosis, thoracic region: Secondary | ICD-10-CM

## 2018-05-03 DIAGNOSIS — M609 Myositis, unspecified: Secondary | ICD-10-CM

## 2018-05-03 DIAGNOSIS — R700 Elevated erythrocyte sedimentation rate: Secondary | ICD-10-CM | POA: Diagnosis not present

## 2018-05-03 DIAGNOSIS — R7303 Prediabetes: Secondary | ICD-10-CM

## 2018-05-03 DIAGNOSIS — E785 Hyperlipidemia, unspecified: Secondary | ICD-10-CM

## 2018-05-03 DIAGNOSIS — Z780 Asymptomatic menopausal state: Secondary | ICD-10-CM

## 2018-05-03 DIAGNOSIS — R1310 Dysphagia, unspecified: Secondary | ICD-10-CM | POA: Insufficient documentation

## 2018-05-03 NOTE — Assessment & Plan Note (Addendum)
Elevated muscle enzyme noted and low dose  Lipitor discontinued ,despite this muscle enzymes have increased, and ESR is slightly elevated. Per Neurology, muscle biopsy isbeing arranged, will go ahead with Rheumatology eval also

## 2018-05-03 NOTE — Assessment & Plan Note (Signed)
6 week h/o dysphagia esp for soft foods like bread also liquids cause pain in a particular area, needs GI eval

## 2018-05-03 NOTE — Telephone Encounter (Signed)
I called the patient.  The patient never heard anything about the muscle biopsy that was set up previously, I will make the referral again, if she does not hear anything within 10 days she is to contact our office.

## 2018-05-03 NOTE — Progress Notes (Signed)
    [name] [name]     MRN: [id number]      DOB: 10-31-1932  HPI: Patient is in for annual physical exam. Chronic health conditions are reviewed and medication management addressed C/o difficulty swallowing liquids and localized pain when she swallows  Labs,on the day of the visit are reviewed after, and referral to rheumatology indicated , also messaged neurologist who is involved in her evaluation of muscle weakness    PE: BP 104/70   Pulse 85   Resp 15   Ht 5' 6"  (1.676 m)   Wt 150 lb (68 kg)   SpO2 99%   BMI 24.21 kg/m   Pleasant  female, alert and oriented x 3, in no cardio-pulmonary distress. Afebrile.Ill appearing, VERY limited mobility due to pain and muscle weakness HEENT No facial trauma or asymetry. Sinuses non tender.  Extra occullar muscles intact,  External ears normal, tympanic membranes clear. Oropharynx moist, no exudate. Neck: supple, no adenopathy,JVD or thyromegaly.No bruits.  Chest: Clear to ascultation bilaterally.No crackles or wheezes. Non tender to palpation  Breast: Not examined in office , however mammogram in June 2019 Normal, and pt denies any palpable mass in breast   Cardiovascular system; Heart sounds normal,  S1 and  S2 ,no S3.  No murmur, or thrill. Apical beat not displaced Peripheral pulses normal.  Abdomen: Soft, non tender, no organomegaly or masses.  Bowel sounds normal. No guarding, tenderness or rebound.    Musculoskeletal exam: Markedly reduced  ROM of spine, hips , shoulders and knees. Marked kyphosis of thoracic spine  Neurologic: Cranial nerves 2 to 12 intact. Power and , tone reduced in all extremities. Abnormal gait. No tremor.  Skin: Intact, no ulceration, erythema , scaling or rash noted. Pigmentation normal throughout  Psych; Normal mood and affect. Judgement and concentration normal   Assessment & Plan:  Dysphagia 6 week h/o dysphagia esp for soft foods like bread also liquids cause pain in a particular  area, needs GI eval  Myopathy Elevated muscle enzyme noted and low dose  Lipitor discontinued ,despite this muscle enzymes have increased, and ESR is slightly elevated. Per Neurology, muscle biopsy isbeing arranged, will go ahead with Rheumatology eval also  Annual physical exam Annual exam as documented. . Immunization and cancer screening needs are specifically addressed at this visit.   NASH (nonalcoholic steatohepatitis) Liver enzymes  stable on repeat  Hyperlipidemia LDL goal <100 Hyperlipidemia:Low fat diet discussed and encouraged.   Lipid Panel  Lab Results  Component Value Date   CHOL 273 (H) 05/03/2018   HDL 96 05/03/2018   LDLCALC 161 (H) 05/03/2018   TRIG 64 05/03/2018   CHOLHDL 2.8 05/03/2018   Pt encouraged to lower fat intake, since HDL is elevated , then threre is a good element of CV protection

## 2018-05-03 NOTE — Patient Instructions (Addendum)
F/U in  4.5 months, call if you need me before  Please schedule bone density test at checkout   Labs today CBC, lipid, cmp and EGFR, HBA1C, ESR, CKMB  You may stop aspirin, insufficient evidence for benefit  Please DO allow your children to be increasingly a part of your health and day to day living  Careful not to fall

## 2018-05-04 LAB — COMPLETE METABOLIC PANEL WITH GFR
AG RATIO: 1.7 (calc) (ref 1.0–2.5)
ALT: 39 U/L — ABNORMAL HIGH (ref 6–29)
AST: 56 U/L — ABNORMAL HIGH (ref 10–35)
Albumin: 4.2 g/dL (ref 3.6–5.1)
Alkaline phosphatase (APISO): 42 U/L (ref 37–153)
BUN/Creatinine Ratio: 39 (calc) — ABNORMAL HIGH (ref 6–22)
BUN: 29 mg/dL — ABNORMAL HIGH (ref 7–25)
CO2: 31 mmol/L (ref 20–32)
Calcium: 10 mg/dL (ref 8.6–10.4)
Chloride: 106 mmol/L (ref 98–110)
Creat: 0.74 mg/dL (ref 0.60–0.88)
GFR, EST NON AFRICAN AMERICAN: 75 mL/min/{1.73_m2} (ref >=60)
GFR, Est African American: 87 mL/min/{1.73_m2} (ref >=60)
Globulin: 2.5 g/dL (calc) (ref 1.9–3.7)
Glucose, Bld: 116 mg/dL — ABNORMAL HIGH (ref 65–99)
POTASSIUM: 4 mmol/L (ref 3.5–5.3)
Sodium: 145 mmol/L (ref 135–146)
Total Bilirubin: 0.6 mg/dL (ref 0.2–1.2)
Total Protein: 6.7 g/dL (ref 6.1–8.1)

## 2018-05-04 LAB — CBC
HEMATOCRIT: 37.9 % (ref 35.0–45.0)
HEMOGLOBIN: 12.6 g/dL (ref 11.7–15.5)
MCH: 30 pg (ref 27.0–33.0)
MCHC: 33.2 g/dL (ref 32.0–36.0)
MCV: 90.2 fL (ref 80.0–100.0)
MPV: 11.7 fL (ref 7.5–12.5)
Platelets: 204 10*3/uL (ref 140–400)
RBC: 4.2 10*6/uL (ref 3.80–5.10)
RDW: 12.4 % (ref 11.0–15.0)
WBC: 4.6 10*3/uL (ref 3.8–10.8)

## 2018-05-04 LAB — LIPID PANEL
Cholesterol: 273 mg/dL — ABNORMAL HIGH (ref <200)
HDL: 96 mg/dL (ref >=50)
LDL Cholesterol (Calc): 161 mg/dL (calc) — ABNORMAL HIGH
NON-HDL CHOLESTEROL (CALC): 177 mg/dL — AB (ref <130)
Total CHOL/HDL Ratio: 2.8 (calc) (ref <5.0)
Triglycerides: 64 mg/dL (ref <150)

## 2018-05-04 LAB — HEMOGLOBIN A1C
HEMOGLOBIN A1C: 5.9 %{Hb} — AB (ref <5.7)
Mean Plasma Glucose: 123 (calc)
eAG (mmol/L): 6.8 (calc)

## 2018-05-04 LAB — CK TOTAL AND CKMB (NOT AT ARMC)
CK, MB: 12.5 ng/mL — ABNORMAL HIGH (ref 0–5.0)
Relative Index: 1.2 (ref 0–4.0)
Total CK: 1030 U/L — ABNORMAL HIGH (ref 29–143)

## 2018-05-04 LAB — SEDIMENTATION RATE: Sed Rate: 36 mm/h — ABNORMAL HIGH (ref 0–30)

## 2018-05-05 ENCOUNTER — Telehealth: Payer: Self-pay

## 2018-05-05 NOTE — Telephone Encounter (Signed)
CKMB was HH at 12.5. Repeated and verified. Printed results and took to Dr Moshe Cipro. She was already aware of the results

## 2018-05-06 ENCOUNTER — Telehealth: Payer: Self-pay | Admitting: Family Medicine

## 2018-05-06 ENCOUNTER — Encounter: Payer: Self-pay | Admitting: Family Medicine

## 2018-05-06 NOTE — Telephone Encounter (Signed)
Pt is calling you back

## 2018-05-06 NOTE — Assessment & Plan Note (Signed)
Liver enzymes  stable on repeat

## 2018-05-06 NOTE — Assessment & Plan Note (Signed)
Annual exam as documented. . Immunization and cancer screening needs are specifically addressed at this visit.  

## 2018-05-06 NOTE — Telephone Encounter (Signed)
Returned patients cough. She had left to attend funeral. Will have to call me back on Monday

## 2018-05-06 NOTE — Assessment & Plan Note (Signed)
Hyperlipidemia:Low fat diet discussed and encouraged.   Lipid Panel  Lab Results  Component Value Date   CHOL 273 (H) 05/03/2018   HDL 96 05/03/2018   LDLCALC 161 (H) 05/03/2018   TRIG 64 05/03/2018   CHOLHDL 2.8 05/03/2018   Pt encouraged to lower fat intake, since HDL is elevated , then threre is a good element of CV protection

## 2018-05-09 NOTE — Telephone Encounter (Signed)
Spoke with patient. Advised of labs with verbal understanding.

## 2018-05-16 ENCOUNTER — Ambulatory Visit: Payer: Self-pay | Admitting: Surgery

## 2018-05-16 DIAGNOSIS — M6281 Muscle weakness (generalized): Secondary | ICD-10-CM | POA: Diagnosis not present

## 2018-05-16 NOTE — H&P (Signed)
[name] [name] Documented: 05/16/2018 2:23 PM Location: [facility] Patient #: [id number] DOB: Mar 15, 1933 Married / Language: English / Race: Black or African American Female  History of Present Illness [name] Moores A. Pape Parson MD; 05/16/2018 2:38 PM) Patient words: Patient sent at the request of Dr. Jannifer [name] for left leg weakness. She is being worked up for what she describes as left leg weakness from spinal stenosis. She is felt to have findings worrisome for possible other neuromuscular disorder and therefore a left thigh muscle biopsy was requested from the vastus lateralis compartment to further assist in her workup and care by [name] [name]. She's had progressive lower extremity weakness that she attributes to her osteoarthritis as well as spinal stenosis. She walks a walker now. She has good bowel bladder function.  The patient is a 85 year old female.   Past Surgical History [name] [name], CMA; 05/16/2018 2:26 PM) Appendectomy Breast Biopsy Left. Gallbladder Surgery - Open Hysterectomy (due to cancer) - Complete Tonsillectomy  Diagnostic Studies History [name] [name], CMA; 05/16/2018 2:26 PM) Colonoscopy 1-5 years ago Mammogram within last year  Allergies [name] [name], CMA; 05/16/2018 2:24 PM) Cipro *FLUOROQUINOLONES* Penicillin G Pot in Dextrose *PENICILLINS* Norco *ANALGESICS - OPIOID*  Medication History ([name] [name], [place]; 05/16/2018 2:26 PM) Triamterene-HCTZ (75-50MG Tablet, Oral) Active. Vitamin D (1000UNIT Tablet, Oral) Active. Cinnamon Plus Chromium (Oral) Specific strength unknown - Active. Osteo Bi-Flex Regular Strength (Oral) Specific strength unknown - Active. Medications Reconciled  Social History [name] [name], CMA; 05/16/2018 2:26 PM) Caffeine use Carbonated beverages. No alcohol use No drug use Tobacco use Never smoker.  Family History [name] [name], CMA; 05/16/2018 2:26 PM) Arthritis [name] [name]. Breast  Cancer [name] [name]. Diabetes Mellitus [name] [name]. Hypertension [name] [name]. Prostate Cancer [name] [name].  Pregnancy / Birth History [name] [name], CMA; 05/16/2018 2:26 PM) Age at menarche 24 years. Age of menopause 78-60 Maternal age 75-25 Para 3  Other Problems [name] [name], CMA; 05/16/2018 2:26 PM) Arthritis Back Pain Bladder Problems Cervical Cancer High blood pressure Hypercholesterolemia     Review of Systems [name] [name] CMA; 05/16/2018 2:26 PM) General Not Present- Appetite Loss, Chills, Fatigue, Fever, Night Sweats, Weight Gain and Weight Loss. Skin Not Present- Change in Wart/Mole, Dryness, Hives, Jaundice, New Lesions, Non-Healing Wounds, Rash and Ulcer. HEENT Present- Hoarseness. Not Present- Earache, Hearing Loss, Nose Bleed, Oral Ulcers, Ringing in the Ears, Seasonal Allergies, Sinus Pain, Sore Throat, Visual Disturbances, Wears glasses/contact lenses and Yellow Eyes. Breast Not Present- Breast Mass, Breast Pain, Nipple Discharge and Skin Changes. Cardiovascular Present- Swelling of Extremities. Not Present- Chest Pain, Difficulty Breathing Lying Down, Leg Cramps, Palpitations, Rapid Heart Rate and Shortness of Breath. Gastrointestinal Present- Bloating. Not Present- Abdominal Pain, Bloody Stool, Change in Bowel Habits, Chronic diarrhea, Constipation, Difficulty Swallowing, Excessive gas, Gets full quickly at meals, Hemorrhoids, Indigestion, Nausea, Rectal Pain and Vomiting. Female Genitourinary Present- Frequency. Not Present- Nocturia, Painful Urination, Pelvic Pain and Urgency. Neurological Present- Numbness and Trouble walking. Not Present- Decreased Memory, Fainting, Headaches, Seizures, Tingling, Tremor and Weakness. Psychiatric Not Present- Anxiety, Bipolar, Change in Sleep Pattern, Depression, Fearful and Frequent crying. Endocrine Present- Hair Changes. Not Present- Cold Intolerance, Excessive Hunger, Heat Intolerance, Hot flashes and New  Diabetes.  Vitals [name] [name] CMA; 05/16/2018 2:24 PM) 05/16/2018 2:23 PM Weight: 152.13 lb Height: 66in Body Surface Area: 1.78 m Body Mass Index: 24.55 kg/m  Pulse: 69 (Regular)  BP: 112/70 (Sitting, Left Arm, Standard)      Physical Exam ([name] Pursley A. Jacora Hopkins MD;  05/16/2018 2:39 PM)  General Mental Status-Alert. General Appearance-Consistent with stated age. Hydration-Well hydrated. Voice-Normal.  Head and Neck Head-normocephalic, atraumatic with no lesions or palpable masses. Trachea-midline. Thyroid Gland Characteristics - normal size and consistency.  Eye Eyeball - Bilateral-Extraocular movements intact. Sclera/Conjunctiva - Bilateral-No scleral icterus.  Chest and Lung Exam Chest and lung exam reveals -quiet, even and easy respiratory effort with no use of accessory muscles and on auscultation, normal breath sounds, no adventitious sounds and normal vocal resonance. Inspection Chest Wall - Normal. Back - normal.  Cardiovascular Cardiovascular examination reveals -normal heart sounds, regular rate and rhythm with no murmurs and normal pedal pulses bilaterally.  Abdomen Inspection Inspection of the abdomen reveals - No Hernias. Skin - Scar - no surgical scars. Palpation/Percussion Palpation and Percussion of the abdomen reveal - Soft, Non Tender, No Rebound tenderness, No Rigidity (guarding) and No hepatosplenomegaly. Auscultation Auscultation of the abdomen reveals - Bowel sounds normal.  Neurologic Neurologic evaluation reveals -alert and oriented x 3 with no impairment of recent or remote memory. Mental Status-Normal.  Musculoskeletal Normal Exam - Left-Upper Extremity Strength Normal and Lower Extremity Strength Normal. Normal Exam - Right-Upper Extremity Strength Normal and Lower Extremity Strength Normal.    Assessment & Plan ([name] Harm A. Loisann Roach MD; 05/16/2018 2:39 PM)  MUSCLE WEAKNESS OF EXTREMITY  (M62.81) Impression: Muscle biopsy requested by neurologist to exclude other neuromuscular disorders to explain patient's left leg weakness. Risk of bleeding, infection, wound complications, need further procedures and/or treatment discussed with the patient. Rationale for biopsy discussed as well as information that it will provide her neurologist for further care and workup.  Current Plans You are being scheduled for surgery- Our schedulers will call you.  You should hear from our office's scheduling department within 5 working days about the location, date, and time of surgery. We try to make accommodations for patient's preferences in scheduling surgery, but sometimes the OR schedule or the surgeon's schedule prevents [place] from making those accommodations.  If you have not heard from our office [phone]) in 5 working days, call the office and ask for your surgeon's nurse.  If you have other questions about your diagnosis, plan, or surgery, call the office and ask for your surgeon's nurse.  The anatomy and physiology of muscular system as discussed. Etiologies of muscle weakness and differential diagnosis including myositis was discussed. Options were discussed and recommendation was made to proceed with the referring doctor's request for muscle biopsy. Given the need for a deeper incision, I strongly recommended general anesthesia for analgesia and allowing controlled hemostasis. I noted that it can take some time for pathology to result as it must be processed and sent to a national institution for bundling.  Risks such as bleeding hematoma wound infection abscess pain and other risks were discussed in detail. Questions were answered, and the patient agrees to proceed  Pt Education - CCS Free Text Education/Instructions: discussed with patient and provided information.

## 2018-05-16 NOTE — H&P (View-Only) (Signed)
[name] [name] Documented: 05/16/2018 2:23 PM Location: [facility] Patient #: [phone] DOB: [birth year] Married / Language: English / Race: Black or African American Female  History of Present Illness Marcello Moores A. [name] Naff MD; 05/16/2018 2:38 PM) Patient words: Patient sent at the request of Dr. Jannifer Franklin for left leg weakness. She is being worked up for what she describes as left leg weakness from spinal stenosis. She is felt to have findings worrisome for possible other neuromuscular disorder and therefore a left thigh muscle biopsy was requested from the vastus lateralis compartment to further assist in her workup and care by Dr. Rock Nephew. She's had progressive lower extremity weakness that she attributes to her osteoarthritis as well as spinal stenosis. She walks a walker now. She has good bowel bladder function.  The patient is a 85 year old female.   Past Surgical History [name], CMA; 05/16/2018 2:26 PM) Appendectomy Breast Biopsy Left. Gallbladder Surgery - Open Hysterectomy (due to cancer) - Complete Tonsillectomy  Diagnostic Studies History [name], CMA; 05/16/2018 2:26 PM) Colonoscopy 1-5 years ago Mammogram within last year  Allergies [name], CMA; 05/16/2018 2:24 PM) Cipro *FLUOROQUINOLONES* Penicillin G Pot in Dextrose *PENICILLINS* Norco *ANALGESICS - OPIOID*  Medication History ([name], [place]; 05/16/2018 2:26 PM) Triamterene-HCTZ (75-50MG Tablet, Oral) Active. Vitamin D (1000UNIT Tablet, Oral) Active. Cinnamon Plus Chromium (Oral) Specific strength unknown - Active. Osteo Bi-Flex Regular Strength (Oral) Specific strength unknown - Active. Medications Reconciled  Social History [name], CMA; 05/16/2018 2:26 PM) Caffeine use Carbonated beverages. No alcohol use No drug use Tobacco use Never smoker.  Family History [name], CMA; 05/16/2018 2:26 PM) Arthritis Brother. Breast  Cancer Sister. Diabetes Mellitus Sister. Hypertension Brother. Prostate Cancer Brother.  Pregnancy / Birth History [name], CMA; 05/16/2018 2:26 PM) Age at menarche 54 years. Age of menopause 2-60 Maternal age 11-25 Para 3  Other Problems [name], CMA; 05/16/2018 2:26 PM) Arthritis Back Pain Bladder Problems Cervical Cancer High blood pressure Hypercholesterolemia     Review of Systems [facility] R. Brooks CMA; 05/16/2018 2:26 PM) General Not Present- Appetite Loss, Chills, Fatigue, Fever, Night Sweats, Weight Gain and Weight Loss. Skin Not Present- Change in Wart/Mole, Dryness, Hives, Jaundice, New Lesions, Non-Healing Wounds, Rash and Ulcer. HEENT Present- Hoarseness. Not Present- Earache, Hearing Loss, Nose Bleed, Oral Ulcers, Ringing in the Ears, Seasonal Allergies, Sinus Pain, Sore Throat, Visual Disturbances, Wears glasses/contact lenses and Yellow Eyes. Breast Not Present- Breast Mass, Breast Pain, Nipple Discharge and Skin Changes. Cardiovascular Present- Swelling of Extremities. Not Present- Chest Pain, Difficulty Breathing Lying Down, Leg Cramps, Palpitations, Rapid Heart Rate and Shortness of Breath. Gastrointestinal Present- Bloating. Not Present- Abdominal Pain, Bloody Stool, Change in Bowel Habits, Chronic diarrhea, Constipation, Difficulty Swallowing, Excessive gas, Gets full quickly at meals, Hemorrhoids, Indigestion, Nausea, Rectal Pain and Vomiting. Female Genitourinary Present- Frequency. Not Present- Nocturia, Painful Urination, Pelvic Pain and Urgency. Neurological Present- Numbness and Trouble walking. Not Present- Decreased Memory, Fainting, Headaches, Seizures, Tingling, Tremor and Weakness. Psychiatric Not Present- Anxiety, Bipolar, Change in Sleep Pattern, Depression, Fearful and Frequent crying. Endocrine Present- Hair Changes. Not Present- Cold Intolerance, Excessive Hunger, Heat Intolerance, Hot flashes and New  Diabetes.  Vitals [name] CMA; 05/16/2018 2:24 PM) 05/16/2018 2:23 PM Weight: 152.13 lb Height: 66in Body Surface Area: 1.78 m Body Mass Index: 24.55 kg/m  Pulse: 69 (Regular)  BP: 112/70 (Sitting, Left Arm, Standard)      Physical Exam ([name] Beeghly A. Stacie Templin MD;  05/16/2018 2:39 PM)  General Mental Status-Alert. General Appearance-Consistent with stated age. Hydration-Well hydrated. Voice-Normal.  Head and Neck Head-normocephalic, atraumatic with no lesions or palpable masses. Trachea-midline. Thyroid Gland Characteristics - normal size and consistency.  Eye Eyeball - Bilateral-Extraocular movements intact. Sclera/Conjunctiva - Bilateral-No scleral icterus.  Chest and Lung Exam Chest and lung exam reveals -quiet, even and easy respiratory effort with no use of accessory muscles and on auscultation, normal breath sounds, no adventitious sounds and normal vocal resonance. Inspection Chest Wall - Normal. Back - normal.  Cardiovascular Cardiovascular examination reveals -normal heart sounds, regular rate and rhythm with no murmurs and normal pedal pulses bilaterally.  Abdomen Inspection Inspection of the abdomen reveals - No Hernias. Skin - Scar - no surgical scars. Palpation/Percussion Palpation and Percussion of the abdomen reveal - Soft, Non Tender, No Rebound tenderness, No Rigidity (guarding) and No hepatosplenomegaly. Auscultation Auscultation of the abdomen reveals - Bowel sounds normal.  Neurologic Neurologic evaluation reveals -alert and oriented x 3 with no impairment of recent or remote memory. Mental Status-Normal.  Musculoskeletal Normal Exam - Left-Upper Extremity Strength Normal and Lower Extremity Strength Normal. Normal Exam - Right-Upper Extremity Strength Normal and Lower Extremity Strength Normal.    Assessment & Plan ([name] Koker A. Rashika Bettes MD; 05/16/2018 2:39 PM)  MUSCLE WEAKNESS OF EXTREMITY  (M62.81) Impression: Muscle biopsy requested by neurologist to exclude other neuromuscular disorders to explain patient's left leg weakness. Risk of bleeding, infection, wound complications, need further procedures and/or treatment discussed with the patient. Rationale for biopsy discussed as well as information that it will provide her neurologist for further care and workup.  Current Plans You are being scheduled for surgery- Our schedulers will call you.  You should hear from our office's scheduling department within 5 working days about the location, date, and time of surgery. We try to make accommodations for patient's preferences in scheduling surgery, but sometimes the OR schedule or the surgeon's schedule prevents [place] from making those accommodations.  If you have not heard from our office [phone]) in 5 working days, call the office and ask for your surgeon's nurse.  If you have other questions about your diagnosis, plan, or surgery, call the office and ask for your surgeon's nurse.  The anatomy and physiology of muscular system as discussed. Etiologies of muscle weakness and differential diagnosis including myositis was discussed. Options were discussed and recommendation was made to proceed with the referring doctor's request for muscle biopsy. Given the need for a deeper incision, I strongly recommended general anesthesia for analgesia and allowing controlled hemostasis. I noted that it can take some time for pathology to result as it must be processed and sent to a national institution for bundling.  Risks such as bleeding hematoma wound infection abscess pain and other risks were discussed in detail. Questions were answered, and the patient agrees to proceed  Pt Education - CCS Free Text Education/Instructions: discussed with patient and provided information.

## 2018-05-17 ENCOUNTER — Ambulatory Visit (HOSPITAL_COMMUNITY)
Admission: RE | Admit: 2018-05-17 | Discharge: 2018-05-17 | Disposition: A | Discharge status: Home / Self Care | Payer: Medicare HMO | Source: Ambulatory Visit | Attending: Family Medicine | Admitting: Family Medicine

## 2018-05-17 DIAGNOSIS — M40204 Unspecified kyphosis, thoracic region: Secondary | ICD-10-CM | POA: Diagnosis not present

## 2018-05-17 DIAGNOSIS — Z780 Asymptomatic menopausal state: Secondary | ICD-10-CM | POA: Diagnosis not present

## 2018-05-17 DIAGNOSIS — M8589 Other specified disorders of bone density and structure, multiple sites: Secondary | ICD-10-CM | POA: Diagnosis not present

## 2018-05-26 ENCOUNTER — Other Ambulatory Visit: Payer: Self-pay

## 2018-05-26 ENCOUNTER — Encounter (HOSPITAL_BASED_OUTPATIENT_CLINIC_OR_DEPARTMENT_OTHER): Payer: Self-pay

## 2018-06-01 ENCOUNTER — Encounter (HOSPITAL_BASED_OUTPATIENT_CLINIC_OR_DEPARTMENT_OTHER)
Admission: RE | Admit: 2018-06-01 | Discharge: 2018-06-01 | Disposition: A | Discharge status: Home / Self Care | Payer: Medicare HMO | Source: Ambulatory Visit | Attending: Surgery | Admitting: Surgery

## 2018-06-01 ENCOUNTER — Other Ambulatory Visit: Payer: Self-pay

## 2018-06-01 DIAGNOSIS — I10 Essential (primary) hypertension: Secondary | ICD-10-CM | POA: Insufficient documentation

## 2018-06-01 DIAGNOSIS — Z01818 Encounter for other preprocedural examination: Secondary | ICD-10-CM | POA: Insufficient documentation

## 2018-06-01 LAB — COMPREHENSIVE METABOLIC PANEL
ALT: 46 U/L — AB (ref 0–44)
AST: 69 U/L — ABNORMAL HIGH (ref 15–41)
Albumin: 3.7 g/dL (ref 3.5–5.0)
Alkaline Phosphatase: 38 U/L (ref 38–126)
Anion gap: 9 (ref 5–15)
BUN: 25 mg/dL — ABNORMAL HIGH (ref 8–23)
CO2: 25 mmol/L (ref 22–32)
Calcium: 9.9 mg/dL (ref 8.9–10.3)
Chloride: 107 mmol/L (ref 98–111)
Creatinine, Ser: 0.75 mg/dL (ref 0.44–1.00)
GFR calc Af Amer: >60 mL/min (ref >60)
GFR calc non Af Amer: >60 mL/min (ref >60)
Glucose, Bld: 106 mg/dL — ABNORMAL HIGH (ref 70–99)
Potassium: 3.4 mmol/L — ABNORMAL LOW (ref 3.5–5.1)
Sodium: 141 mmol/L (ref 135–145)
Total Bilirubin: 0.8 mg/dL (ref 0.3–1.2)
Total Protein: 6.5 g/dL (ref 6.5–8.1)

## 2018-06-01 LAB — CBC WITH DIFFERENTIAL/PLATELET
ABS IMMATURE GRANULOCYTES: 0.01 10*3/uL (ref 0.00–0.07)
Basophils Absolute: 0 10*3/uL (ref 0.0–0.1)
Basophils Relative: 0 %
Eosinophils Absolute: 0.1 10*3/uL (ref 0.0–0.5)
Eosinophils Relative: 1 %
HCT: 38.7 % (ref 36.0–46.0)
Hemoglobin: 12.1 g/dL (ref 12.0–15.0)
Immature Granulocytes: 0 %
Lymphocytes Relative: 25 %
Lymphs Abs: 1.5 10*3/uL (ref 0.7–4.0)
MCH: 28.7 pg (ref 26.0–34.0)
MCHC: 31.3 g/dL (ref 30.0–36.0)
MCV: 91.7 fL (ref 80.0–100.0)
Monocytes Absolute: 0.6 10*3/uL (ref 0.1–1.0)
Monocytes Relative: 10 %
Neutro Abs: 3.7 10*3/uL (ref 1.7–7.7)
Neutrophils Relative %: 64 %
Platelets: 189 10*3/uL (ref 150–400)
RBC: 4.22 MIL/uL (ref 3.87–5.11)
RDW: 12.7 % (ref 11.5–15.5)
WBC: 5.8 10*3/uL (ref 4.0–10.5)
nRBC: 0 % (ref 0.0–0.2)

## 2018-06-01 NOTE — Progress Notes (Signed)
Lab results reviewed by Dr. Marcell Barlow, will proceed with surgery as scheduled.

## 2018-06-07 ENCOUNTER — Ambulatory Visit (HOSPITAL_BASED_OUTPATIENT_CLINIC_OR_DEPARTMENT_OTHER)
Admission: RE | Admit: 2018-06-07 | Discharge: 2018-06-07 | Disposition: A | Discharge status: Home / Self Care | Payer: Medicare HMO | Attending: Surgery | Admitting: Surgery

## 2018-06-07 ENCOUNTER — Ambulatory Visit (HOSPITAL_BASED_OUTPATIENT_CLINIC_OR_DEPARTMENT_OTHER): Payer: Medicare HMO | Admitting: Anesthesiology

## 2018-06-07 ENCOUNTER — Other Ambulatory Visit: Payer: Self-pay

## 2018-06-07 ENCOUNTER — Encounter (HOSPITAL_BASED_OUTPATIENT_CLINIC_OR_DEPARTMENT_OTHER)
Admission: RE | Disposition: A | Discharge status: Home / Self Care | Payer: Self-pay | Source: Home / Self Care | Attending: Surgery

## 2018-06-07 ENCOUNTER — Encounter (HOSPITAL_BASED_OUTPATIENT_CLINIC_OR_DEPARTMENT_OTHER): Payer: Self-pay | Admitting: Anesthesiology

## 2018-06-07 DIAGNOSIS — M1990 Unspecified osteoarthritis, unspecified site: Secondary | ICD-10-CM | POA: Insufficient documentation

## 2018-06-07 DIAGNOSIS — G7249 Other inflammatory and immune myopathies, not elsewhere classified: Secondary | ICD-10-CM | POA: Insufficient documentation

## 2018-06-07 DIAGNOSIS — Z79899 Other long term (current) drug therapy: Secondary | ICD-10-CM | POA: Insufficient documentation

## 2018-06-07 DIAGNOSIS — E785 Hyperlipidemia, unspecified: Secondary | ICD-10-CM | POA: Diagnosis not present

## 2018-06-07 DIAGNOSIS — K219 Gastro-esophageal reflux disease without esophagitis: Secondary | ICD-10-CM | POA: Diagnosis not present

## 2018-06-07 DIAGNOSIS — I10 Essential (primary) hypertension: Secondary | ICD-10-CM | POA: Diagnosis not present

## 2018-06-07 DIAGNOSIS — Z881 Allergy status to other antibiotic agents status: Secondary | ICD-10-CM | POA: Insufficient documentation

## 2018-06-07 DIAGNOSIS — Z880 Allergy status to penicillin: Secondary | ICD-10-CM | POA: Insufficient documentation

## 2018-06-07 DIAGNOSIS — Z885 Allergy status to narcotic agent status: Secondary | ICD-10-CM | POA: Diagnosis not present

## 2018-06-07 DIAGNOSIS — M6281 Muscle weakness (generalized): Secondary | ICD-10-CM | POA: Diagnosis not present

## 2018-06-07 HISTORY — PX: MUSCLE BIOPSY: SHX716

## 2018-06-07 SURGERY — MUSCLE BIOPSY
Anesthesia: General | Laterality: Left

## 2018-06-07 MED ORDER — CHLORHEXIDINE GLUCONATE CLOTH 2 % EX PADS: 6.0000 | MEDICATED_PAD | Freq: Once | CUTANEOUS | Status: DC

## 2018-06-07 MED ORDER — HYDROCODONE-ACETAMINOPHEN 5-325 MG PO TABS: 1.0000 | ORAL_TABLET | Freq: Four times a day (QID) | ORAL | 0 refills | Status: DC | PRN

## 2018-06-07 MED ORDER — SCOPOLAMINE 1 MG/3DAYS TD PT72: 1.0000 | MEDICATED_PATCH | Freq: Once | TRANSDERMAL | Status: DC | PRN

## 2018-06-07 MED ORDER — PROPOFOL 500 MG/50ML IV EMUL
INTRAVENOUS | Status: AC
  Filled 2018-06-07: qty 50

## 2018-06-07 MED ORDER — CLINDAMYCIN PHOSPHATE 900 MG/50ML IV SOLN
INTRAVENOUS | Status: AC
  Filled 2018-06-07: qty 50

## 2018-06-07 MED ORDER — ONDANSETRON HCL 4 MG/2ML IJ SOLN
INTRAMUSCULAR | Status: AC
  Filled 2018-06-07: qty 2

## 2018-06-07 MED ORDER — LACTATED RINGERS IV SOLN
INTRAVENOUS | Status: DC
  Administered 2018-06-07: 09:00:00 via INTRAVENOUS

## 2018-06-07 MED ORDER — FENTANYL CITRATE (PF) 100 MCG/2ML IJ SOLN: 25.0000 ug | INTRAMUSCULAR | Status: DC | PRN

## 2018-06-07 MED ORDER — PHENYLEPHRINE HCL 10 MG/ML IJ SOLN
INTRAMUSCULAR | Status: DC | PRN
  Administered 2018-06-07: 80 ug via INTRAVENOUS

## 2018-06-07 MED ORDER — ACETAMINOPHEN 500 MG PO TABS
ORAL_TABLET | ORAL | Status: AC
  Filled 2018-06-07: qty 1

## 2018-06-07 MED ORDER — FENTANYL CITRATE (PF) 100 MCG/2ML IJ SOLN
INTRAMUSCULAR | Status: AC
  Filled 2018-06-07: qty 2

## 2018-06-07 MED ORDER — PROPOFOL 10 MG/ML IV BOLUS
INTRAVENOUS | Status: DC | PRN
  Administered 2018-06-07 (×2): 20 mg via INTRAVENOUS

## 2018-06-07 MED ORDER — GABAPENTIN 300 MG PO CAPS
300.0000 mg | ORAL_CAPSULE | ORAL | Status: AC
  Administered 2018-06-07: 300 mg via ORAL

## 2018-06-07 MED ORDER — MEPERIDINE HCL 25 MG/ML IJ SOLN: 6.2500 mg | INTRAMUSCULAR | Status: DC | PRN

## 2018-06-07 MED ORDER — ACETAMINOPHEN 500 MG PO TABS
ORAL_TABLET | ORAL | Status: AC
  Filled 2018-06-07: qty 2

## 2018-06-07 MED ORDER — ACETAMINOPHEN 500 MG PO TABS
1000.0000 mg | ORAL_TABLET | ORAL | Status: AC
  Administered 2018-06-07: 1000 mg via ORAL

## 2018-06-07 MED ORDER — BUPIVACAINE-EPINEPHRINE 0.5% -1:200000 IJ SOLN
INTRAMUSCULAR | Status: DC | PRN
  Administered 2018-06-07: 10 mL
  Administered 2018-06-07: 8 mL

## 2018-06-07 MED ORDER — PROPOFOL 500 MG/50ML IV EMUL
INTRAVENOUS | Status: DC | PRN
  Administered 2018-06-07: 50 ug/kg/min via INTRAVENOUS

## 2018-06-07 MED ORDER — FENTANYL CITRATE (PF) 100 MCG/2ML IJ SOLN: 50.0000 ug | INTRAMUSCULAR | Status: DC | PRN

## 2018-06-07 MED ORDER — MIDAZOLAM HCL 2 MG/2ML IJ SOLN: 1.0000 mg | INTRAMUSCULAR | Status: DC | PRN

## 2018-06-07 MED ORDER — CLINDAMYCIN PHOSPHATE 900 MG/50ML IV SOLN
900.0000 mg | INTRAVENOUS | Status: AC
  Administered 2018-06-07: 900 mg via INTRAVENOUS

## 2018-06-07 MED ORDER — ONDANSETRON HCL 4 MG/2ML IJ SOLN
INTRAMUSCULAR | Status: DC | PRN
  Administered 2018-06-07: 4 mg via INTRAVENOUS

## 2018-06-07 MED ORDER — GABAPENTIN 300 MG PO CAPS
ORAL_CAPSULE | ORAL | Status: AC
  Filled 2018-06-07: qty 1

## 2018-06-07 SURGICAL SUPPLY — 41 items
ADH SKN CLS APL DERMABOND .7 (GAUZE/BANDAGES/DRESSINGS) ×1
APL PRP STRL LF DISP 70% ISPRP (MISCELLANEOUS) ×1
BLADE SURG 15 STRL LF DISP TIS (BLADE) ×1 IMPLANT
BLADE SURG 15 STRL SS (BLADE) ×3
CHLORAPREP W/TINT 26 (MISCELLANEOUS) ×3 IMPLANT
COVER BACK TABLE 60X90IN (DRAPES) ×3 IMPLANT
COVER MAYO STAND STRL (DRAPES) ×3 IMPLANT
COVER WAND RF STERILE (DRAPES) IMPLANT
DECANTER SPIKE VIAL GLASS SM (MISCELLANEOUS) IMPLANT
DEPRESSOR TONGUE BLADE STERILE (MISCELLANEOUS) ×1 IMPLANT
DERMABOND ADVANCED (GAUZE/BANDAGES/DRESSINGS) ×2
DERMABOND ADVANCED .7 DNX12 (GAUZE/BANDAGES/DRESSINGS) ×1 IMPLANT
DRAPE LAPAROTOMY 100X72 PEDS (DRAPES) ×3 IMPLANT
DRAPE UTILITY XL STRL (DRAPES) ×3 IMPLANT
ELECT COATED BLADE 2.86 ST (ELECTRODE) ×3 IMPLANT
ELECT REM PT RETURN 9FT ADLT (ELECTROSURGICAL) ×3
ELECTRODE REM PT RTRN 9FT ADLT (ELECTROSURGICAL) ×1 IMPLANT
GLOVE BIO SURGEON STRL SZ7 (GLOVE) ×2 IMPLANT
GLOVE BIO SURGEON STRL SZ8 (GLOVE) ×3 IMPLANT
GLOVE BIOGEL PI IND STRL 7.5 (GLOVE) IMPLANT
GLOVE BIOGEL PI IND STRL 8 (GLOVE) ×1 IMPLANT
GLOVE BIOGEL PI INDICATOR 7.5 (GLOVE) ×2
GLOVE BIOGEL PI INDICATOR 8 (GLOVE) ×2
GLOVE EXAM NITRILE PF MED BLUE (GLOVE) ×2 IMPLANT
GOWN STRL REUS W/ TWL LRG LVL3 (GOWN DISPOSABLE) ×2 IMPLANT
GOWN STRL REUS W/ TWL XL LVL3 (GOWN DISPOSABLE) ×1 IMPLANT
GOWN STRL REUS W/TWL LRG LVL3 (GOWN DISPOSABLE) ×3
GOWN STRL REUS W/TWL XL LVL3 (GOWN DISPOSABLE) ×3
NDL HYPO 25X1 1.5 SAFETY (NEEDLE) ×1 IMPLANT
NEEDLE HYPO 25X1 1.5 SAFETY (NEEDLE) ×3 IMPLANT
NS IRRIG 1000ML POUR BTL (IV SOLUTION) IMPLANT
PACK BASIN DAY SURGERY FS (CUSTOM PROCEDURE TRAY) ×3 IMPLANT
PENCIL BUTTON HOLSTER BLD 10FT (ELECTRODE) ×3 IMPLANT
SLEEVE SCD COMPRESS KNEE MED (MISCELLANEOUS) ×2 IMPLANT
SPONGE LAP 4X18 RFD (DISPOSABLE) ×3 IMPLANT
SUT ETHILON 3 0 PS 1 (SUTURE) ×2 IMPLANT
SUT VIC AB 3-0 PS1 18 (SUTURE)
SUT VIC AB 3-0 PS1 18XBRD (SUTURE) IMPLANT
SUT VICRYL 3-0 CR8 SH (SUTURE) ×2 IMPLANT
SYR CONTROL 10ML LL (SYRINGE) ×3 IMPLANT
TAPE HYPAFIX 4 X10 (GAUZE/BANDAGES/DRESSINGS) IMPLANT

## 2018-06-07 NOTE — Transfer of Care (Signed)
Immediate Anesthesia Transfer of Care Note  Patient: SOTIRIA KEAST  Procedure(s) Performed: LEFT THIGH MUSCLE BIOPSY (Left )  Patient Location: PACU  Anesthesia Type:MAC  Level of Consciousness: sedated  Airway & Oxygen Therapy: Patient Spontanous Breathing and Patient connected to face mask oxygen  Post-op Assessment: Report given to RN and Post -op Vital signs reviewed and stable  Post vital signs: Reviewed and stable  Last Vitals:  Vitals Value Taken Time  BP 124/60 06/07/2018 10:30 AM  Temp    Pulse    Resp 15 06/07/2018 10:30 AM  SpO2    Vitals shown include unvalidated device data.  Last Pain:  Vitals:   06/07/18 0843  TempSrc: Oral         Complications: No apparent anesthesia complications

## 2018-06-07 NOTE — Anesthesia Postprocedure Evaluation (Signed)
Anesthesia Post Note  Patient: [name] [name]  Procedure(s) Performed: LEFT THIGH MUSCLE BIOPSY (Left )     Patient location during evaluation: PACU Anesthesia Type: General Level of consciousness: awake and alert Pain management: pain level controlled Vital Signs Assessment: post-procedure vital signs reviewed and stable Respiratory status: spontaneous breathing, nonlabored ventilation, respiratory function stable and patient connected to nasal cannula oxygen Cardiovascular status: blood pressure returned to baseline and stable Postop Assessment: no apparent nausea or vomiting Anesthetic complications: no    Last Vitals:  Vitals:   06/07/18 1045 06/07/18 1115  BP: 124/69 139/66  Pulse: 73 66  Resp: 18 16  Temp:  36.4 C  SpO2: 98% 100%    Last Pain:  Vitals:   06/07/18 1115  TempSrc:   PainSc: 0-No pain                 Tyrin Herbers

## 2018-06-07 NOTE — Op Note (Signed)
Preoperative diagnosis: Bilateral lower extremity weakness  Postoperative diagnosis: Same  Procedure: Left thigh muscle biopsy involving the vastus lateralis  Surgeon: Erroll Luna, MD  Anesthesia: MAC with 0.25% Sensorcaine local with epinephrine  EBL: Minimal  Drains none  Specimen: Left thigh muscle to pathology  Indications for procedure: The patient is an 85 year old female with progressive lower extremity weakness.  A muscle biopsy was requested by rheumatologist to further evaluate her.  Risk and benefits of the options discussed with the patient and family.   Description of procedure: The patient was met in the holding area and questions were answered.  Left thigh was marked as the correct side.  She was then taken back the operative room.  After MAC anesthesia the left thigh was prepped and draped in sterile fashion timeout was done.  Incision was made over the left lateral thigh.  Dissection was carried down to the muscle compartment of the vastus lateralis was identified.  The fascia was opened.  A 3 cm area of skeletal muscle was removed without difficulty.  Hemostasis excellent.  The fascia was closed with 3-0 Vicryl.  Subcutaneous fat tissue closed with 3-0 Vicryl and skin approximately 3-0 Vicryl as well.  Dermabond applied.  All final counts found to be correct.  The patient was awoke and  taken to recovery in satisfactory condition.

## 2018-06-07 NOTE — Interval H&P Note (Signed)
History and Physical Interval Note:  06/07/2018 9:26 AM  [name] [name]  has presented today for surgery, with the diagnosis of weakness.  The various methods of treatment have been discussed with the patient and family. After consideration of risks, benefits and other options for treatment, the patient has consented to  Procedure(s): [place] (Left) as a surgical intervention.  The patient's history has been reviewed, patient examined, no change in status, stable for surgery.  I have reviewed the patient's chart and labs.  Questions were answered to the patient's satisfaction.     Sandoval

## 2018-06-07 NOTE — Anesthesia Preprocedure Evaluation (Addendum)
Anesthesia Evaluation  Patient identified by MRN, date of birth, ID band Patient awake    Reviewed: Allergy & Precautions, H&P , NPO status , Patient's Chart, lab work & pertinent test results, reviewed documented beta blocker date and time   History of Anesthesia Complications (+) PONV and history of anesthetic complications  Airway Mallampati: I  TM Distance: >3 FB Neck ROM: full    Dental no notable dental hx. (+) Missing, Poor Dentition, Chipped, Dental Advisory Given,    Pulmonary neg pulmonary ROS,    Pulmonary exam normal breath sounds clear to auscultation       Cardiovascular Exercise Tolerance: Good hypertension, Pt. on medications negative cardio ROS   Rhythm:regular Rate:Normal  EKG 3/20 Sinus rhythm with Premature supraventricular complexes Minimal voltage criteria for LVH, may be normal variant   Neuro/Psych  Neuromuscular disease negative neurological ROS  negative psych ROS   GI/Hepatic negative GI ROS, Neg liver ROS, GERD  Medicated,(+) Hepatitis -  Endo/Other  negative endocrine ROS  Renal/GU negative Renal ROS  negative genitourinary   Musculoskeletal  (+) Arthritis ,   Abdominal   Peds  Hematology negative hematology ROS (+) Blood dyscrasia, anemia ,   Anesthesia Other Findings   Reproductive/Obstetrics negative OB ROS                            Anesthesia Physical Anesthesia Plan  ASA: III  Anesthesia Plan: General   Post-op Pain Management:    Induction: Intravenous  PONV Risk Score and Plan: 3 and Ondansetron, Treatment may vary due to age or medical condition and Dexamethasone  Airway Management Planned: LMA  Additional Equipment:   Intra-op Plan:   Post-operative Plan: Extubation in OR  Informed Consent: I have reviewed the patients History and Physical, chart, labs and discussed the procedure including the risks, benefits and alternatives for  the proposed anesthesia with the patient or authorized representative who has indicated his/her understanding and acceptance.     Dental Advisory Given  Plan Discussed with: CRNA, Surgeon and Anesthesiologist  Anesthesia Plan Comments: ( )       Anesthesia Quick Evaluation

## 2018-06-07 NOTE — Discharge Instructions (Signed)
NO TYLENOL BEFORE 2:30 PM TODAY!         GENERAL SURGERY: POST OP INSTRUCTIONS  ######################################################################  EAT Gradually transition to a high fiber diet with a fiber supplement over the next few weeks after discharge.  Start with a pureed / full liquid diet (see below)  WALK Walk an hour a day.  Control your pain to do that.    CONTROL PAIN Control pain so that you can walk, sleep, tolerate sneezing/coughing, go up/down stairs.  HAVE A BOWEL MOVEMENT DAILY Keep your bowels regular to avoid problems.  OK to try a laxative to override constipation.  OK to use an antidairrheal to slow down diarrhea.  Call if not better after 2 tries  CALL IF YOU HAVE PROBLEMS/CONCERNS Call if you are still struggling despite following these instructions. Call if you have concerns not answered by these instructions  ######################################################################    1. DIET: Follow a light bland diet the first 24 hours after arrival home, such as soup, liquids, crackers, etc.  Be sure to include lots of fluids daily.  Avoid fast food or heavy meals as your are more likely to get nauseated.   2. Take your usually prescribed home medications unless otherwise directed. 3. PAIN CONTROL: a. Pain is best controlled by a usual combination of three different methods TOGETHER: i. Ice/Heat ii. Over the counter pain medication iii. Prescription pain medication b. Most patients will experience some swelling and bruising around the incisions.  Ice packs or heating pads (30-60 minutes up to 6 times a day) will help. Use ice for the first few days to help decrease swelling and bruising, then switch to heat to help relax tight/sore spots and speed recovery.  Some people prefer to use ice alone, heat alone, alternating between ice & heat.  Experiment to what works for you.  Swelling and bruising can take several weeks to resolve.   c. It is  helpful to take an over-the-counter pain medication regularly for the first few weeks.  Choose one of the following that works best for you: i. Naproxen (Aleve, etc)  Two 220m tabs twice a day ii. Ibuprofen (Advil, etc) Three 2046mtabs four times a day (every meal & bedtime) iii. Acetaminophen (Tylenol, etc) 500-65021mour times a day (every meal & bedtime) d. A  prescription for pain medication (such as oxycodone, hydrocodone, etc) should be given to you upon discharge.  Take your pain medication as prescribed.  i. If you are having problems/concerns with the prescription medicine (does not control pain, nausea, vomiting, rash, itching, etc), please call us [phone] see if we need to switch you to a different pain medicine that will work better for you and/or control your side effect better. ii. If you need a refill on your pain medication, please contact your pharmacy.  They will contact our office to request authorization. Prescriptions will not be filled after 5 pm or on week-ends. 4. Avoid getting constipated.  Between the surgery and the pain medications, it is common to experience some constipation.  Increasing fluid intake and taking a fiber supplement (such as Metamucil, Citrucel, FiberCon, MiraLax, etc) 1-2 times a day regularly will usually help prevent this problem from occurring.  A mild laxative (prune juice, Milk of Magnesia, MiraLax, etc) should be taken according to package directions if there are no bowel movements after 48 hours.   5. Wash / shower every day.  You may shower over the dressings as they are waterproof.  Continue  to shower over incision(s) after the dressing is off. 6. Remove your waterproof bandages 5 days after surgery.  You may leave the incision open to air.  You may have skin tapes (Steri Strips) covering the incision(s).  Leave them on until one week, then remove.  You may replace a dressing/Band-Aid to cover the incision for comfort if you wish.       7. ACTIVITIES as tolerated:   a. You may resume regular (light) daily activities beginning the next day--such as daily self-care, walking, climbing stairs--gradually increasing activities as tolerated.  If you can walk 30 minutes without difficulty, it is safe to try more intense activity such as jogging, treadmill, bicycling, low-impact aerobics, swimming, etc. b. Save the most intensive and strenuous activity for last such as sit-ups, heavy lifting, contact sports, etc  Refrain from any heavy lifting or straining until you are off narcotics for pain control.   c. DO NOT PUSH THROUGH PAIN.  Let pain be your guide: If it hurts to do something, don't do it.  Pain is your body warning you to avoid that activity for another week until the pain goes down. d. You may drive when you are no longer taking prescription pain medication, you can comfortably wear a seatbelt, and you can safely maneuver your car and apply brakes. e. Dennis Bast may have sexual intercourse when it is comfortable.  8. FOLLOW UP in our office a. Please call [facility] at [phone] to set up an appointment to see your surgeon in the office for a follow-up appointment approximately 2-3 weeks after your surgery. b. Make sure that you call for this appointment the day you arrive home to insure a convenient appointment time. 9. IF YOU HAVE DISABILITY OR FAMILY LEAVE FORMS, BRING THEM TO THE OFFICE FOR PROCESSING.  DO NOT GIVE THEM TO YOUR DOCTOR.   WHEN TO CALL us [phone]: 1. Poor pain control 2. Reactions / problems with new medications (rash/itching, nausea, etc)  3. Fever over 101.5 F (38.5 C) 4. Worsening swelling or bruising 5. Continued bleeding from incision. 6. Increased pain, redness, or drainage from the incision 7. Difficulty breathing / swallowing   The clinic staff is available to answer your questions during regular business hours (8:30am-5pm).  Please dont hesitate to call and ask to speak to one of our nurses  for clinical concerns.   If you have a medical emergency, go to the nearest emergency room or call 911.  A surgeon from [facility] is always on call at the [facility], [place], [place], [place], [place]  [address] ? MAIN: [phone] ? TOLL FREE: [phone] ?  FAX [phone] [email or web address]       Post Anesthesia Home Care Instructions  Activity: Get plenty of rest for the remainder of the day. A responsible individual must stay with you for 24 hours following the procedure.  For the next 24 hours, DO NOT: -Drive a car -Paediatric nurse -Drink alcoholic beverages -Take any medication unless instructed by your physician -Make any legal decisions or sign important papers.  Meals: Start with liquid foods such as gelatin or soup. Progress to regular foods as tolerated. Avoid greasy, spicy, heavy foods. If nausea and/or vomiting occur, drink only clear liquids until the nausea and/or vomiting subsides. Call your physician if vomiting continues.  Special Instructions/Symptoms: Your throat may feel dry or sore from the anesthesia or the breathing tube placed in your throat during  surgery. If this causes discomfort, gargle with warm salt water. The discomfort should disappear within 24 hours.  If you had a scopolamine patch placed behind your ear for the management of post- operative nausea and/or vomiting:  1. The medication in the patch is effective for 72 hours, after which it should be removed.  Wrap patch in a tissue and discard in the trash. Wash hands thoroughly with soap and water. 2. You may remove the patch earlier than 72 hours if you experience unpleasant side effects which may include dry mouth, dizziness or visual disturbances. 3. Avoid touching the patch. Wash your hands with soap and water after contact with the patch.

## 2018-06-08 ENCOUNTER — Encounter (HOSPITAL_BASED_OUTPATIENT_CLINIC_OR_DEPARTMENT_OTHER): Payer: Self-pay | Admitting: Surgery

## 2018-06-21 ENCOUNTER — Encounter (HOSPITAL_COMMUNITY): Payer: Self-pay

## 2018-06-24 ENCOUNTER — Other Ambulatory Visit: Payer: Self-pay | Admitting: Family Medicine

## 2018-06-28 ENCOUNTER — Telehealth: Payer: Self-pay | Admitting: *Deleted

## 2018-06-28 NOTE — Telephone Encounter (Signed)
Pt called needing a refill on blood pressure medication. She did not know the name of it.

## 2018-06-28 NOTE — Telephone Encounter (Signed)
This was sent in yesterday

## 2018-06-28 NOTE — Telephone Encounter (Signed)
Called pt no answer °

## 2018-07-04 ENCOUNTER — Telehealth: Payer: Self-pay

## 2018-07-04 NOTE — Telephone Encounter (Signed)
I contacted the pt in regards to her 07/06/18 8 am appt. I advised due to current COVID 19 pandemic, our office is severely reducing in office visits for at least the next 2 weeks, in order to minimize the risk to our patients and healthcare providers.   Pt was offered a telephone visit and accepted.   Pt understands that although there may be some limitations with this type of visit, we will take all precautions to reduce any security or privacy concerns.  Pt understands that this will be treated like an in office visit and we will file with pt's insurance, and there may be a patient responsible charge related to this service.  Pre charting has been completed for this visit and the best CB # is [phone].

## 2018-07-06 ENCOUNTER — Ambulatory Visit (INDEPENDENT_AMBULATORY_CARE_PROVIDER_SITE_OTHER): Payer: Medicare HMO | Admitting: Neurology

## 2018-07-06 ENCOUNTER — Encounter: Payer: Self-pay | Admitting: Neurology

## 2018-07-06 ENCOUNTER — Other Ambulatory Visit: Payer: Self-pay

## 2018-07-06 ENCOUNTER — Encounter

## 2018-07-06 DIAGNOSIS — G7249 Other inflammatory and immune myopathies, not elsewhere classified: Secondary | ICD-10-CM

## 2018-07-06 DIAGNOSIS — Z5181 Encounter for therapeutic drug level monitoring: Secondary | ICD-10-CM

## 2018-07-06 DIAGNOSIS — G609 Hereditary and idiopathic neuropathy, unspecified: Secondary | ICD-10-CM

## 2018-07-06 DIAGNOSIS — M48062 Spinal stenosis, lumbar region with neurogenic claudication: Secondary | ICD-10-CM

## 2018-07-06 DIAGNOSIS — G629 Polyneuropathy, unspecified: Secondary | ICD-10-CM | POA: Insufficient documentation

## 2018-07-06 HISTORY — DX: Polyneuropathy, unspecified: G62.9

## 2018-07-06 MED ORDER — MYCOPHENOLATE MOFETIL 500 MG PO TABS: 500.0000 mg | ORAL_TABLET | Freq: Two times a day (BID) | ORAL | 3 refills | Status: DC

## 2018-07-06 MED ORDER — PREDNISONE 10 MG PO TABS: ORAL_TABLET | ORAL | 3 refills | Status: DC

## 2018-07-06 NOTE — Progress Notes (Signed)
Virtual Visit via Telephone Note  I connected with [name] [name] on 07/06/18 at  8:00 AM EDT by telephone and verified that I am speaking with the correct person using two identifiers.   I discussed the limitations, risks, security and privacy concerns of performing an evaluation and management service by telephone and the availability of in person appointments. I also discussed with the patient that there may be a patient responsible charge related to this service. The patient expressed understanding and agreed to proceed.   History of Present Illness: [name] [name] is an 85 year old right-handed black female with a history of lumbosacral spinal stenosis and progressive weakness of the lower extremities.  The patient has been seen by Dr. Sherwood Gambler, she was referred for further evaluation.  EMG nerve conduction study showed evidence of a mild to moderate peripheral neuropathy but the patient also had myopathic changes.  She had been on Lipitor and this was discontinued.  Her muscle enzyme levels are running around 1000.  The patient was sent for a muscle biopsy, this was done in mid March 2020.  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  The patient has not had any further falls, she is using a walker to get around, she continues to have leg weakness but she believes that she has been stable since October 2020.  She has stopped the Lipitor.  The patient does have some low back pain with standing, she has bilateral shoulder pain, right greater than left.  She does have some pain down the left leg as well.  She has chronic issues with urinary incontinence that began following uterine surgery.  Patient reports no numbness in the extremities, no burning or stinging in the feet at night.  The patient has been under some stress with an illness with her husband who required hospitalization.  The patient does not have any steps inside the house that she needs to go up and  down.  She does have to push off when trying to get up out of a chair.  She uses a walker at all times.   Observations/Objective: Over the telephone, patient appears to be alert and cooperative, she is responding to questions appropriately, speech is well enunciated, not aphasic or dysarthric.  Assessment and Plan: 1.  Inflammatory myopathy  2.  Lumbosacral spinal stenosis  3.  Bilateral leg weakness, gait instability  The patient will be started on medical therapy for the inflammatory myopathy.  She is to stay off of Lipitor.  We will start prednisone at 40 mg daily for 4 weeks and then go to 30 mg daily for 4 weeks, then take 20 mg daily.  She will start on CellCept 500 mg twice daily.  She has had recent blood work done to include a liver profile and CBC and differential.  Liver enzymes have been chronically mildly elevated, likely related to muscle breakdown.  The patient will follow-up here for a face-to-face visit in 3 months, we will need to get blood work at that time.  Follow Up Instructions: Follow-up with me in 3 months.   I discussed the assessment and treatment plan with the patient. The patient was provided an opportunity to ask questions and all were answered. The patient agreed with the plan and demonstrated an understanding of the instructions.   The patient was advised to call back or seek an in-person evaluation if the symptoms worsen or if the condition fails to improve as anticipated.  I provided 21 minutes of non-face-to-face time during this encounter.   Kathrynn Ducking, MD

## 2018-07-21 ENCOUNTER — Other Ambulatory Visit: Payer: Self-pay

## 2018-07-21 ENCOUNTER — Encounter: Payer: Self-pay | Admitting: Gastroenterology

## 2018-07-21 ENCOUNTER — Ambulatory Visit (INDEPENDENT_AMBULATORY_CARE_PROVIDER_SITE_OTHER): Payer: Medicare HMO | Admitting: Gastroenterology

## 2018-07-21 DIAGNOSIS — K7581 Nonalcoholic steatohepatitis (NASH): Secondary | ICD-10-CM

## 2018-07-21 DIAGNOSIS — K5901 Slow transit constipation: Secondary | ICD-10-CM | POA: Diagnosis not present

## 2018-07-21 DIAGNOSIS — R1310 Dysphagia, unspecified: Secondary | ICD-10-CM

## 2018-07-21 DIAGNOSIS — K219 Gastro-esophageal reflux disease without esophagitis: Secondary | ICD-10-CM | POA: Diagnosis not present

## 2018-07-21 DIAGNOSIS — R1319 Other dysphagia: Secondary | ICD-10-CM

## 2018-07-21 MED ORDER — POLYETHYLENE GLYCOL 3350 17 GM/SCOOP PO POWD: ORAL | 11 refills | Status: DC

## 2018-07-21 NOTE — Progress Notes (Signed)
Subjective:    Patient ID: [name] [name], female    DOB: 07-Mar-1933, 85 y.o.   MRN: [id number]   Primary Care Physician:  Fayrene Helper, MD  Primary GI:  Barney Drain, MD   Patient Location: home   Provider Location: [facility] office   Reason for Visit: CONSTIPATION   Persons present on the virtual encounter, with roles: patient, myself (provider), MARTINA BOOTH CMA (update meds/allergies)   Total time (minutes) spent on medical discussion:  16 MINUTES   Due to COVID-19, visit was VIA TELEPHONE VISIT DUE TO COVID 19. VISIT IS CONDUCTED VIRTUALLY AND WAS REQUESTED BY PATIENT.   Virtual Visit via TELEPHONE   I connected with  [name] [name]  and verified that I am speaking with the correct person using two identifiers.   I discussed the limitations, risks, security and privacy concerns of performing an evaluation and management service by telephone/video and the availability of in person appointments. I also discussed with the patient that there may be a patient responsible charge related to this service. The patient expressed understanding and agreed to proceed.   HPI NEVER BEEN REGULAR AND NOW WITH PREDNISONE/CELLCEPT SINCE APR 2020 FOR INFLAMMATORY MYOPATHY AND COULDN'T WALK. PAIN IS BETTER BUT STILL FEEL WEEK. NOW HAS TROUBLE WITH CONSTIPATION. OTC GENERIC DOCUSATE. BMs: 2-3 TIMES A WEEK MAYBE. NORCO 2X/DAY. USUALLY EATS FIRST MEAL DEPENDS ON HOW HER LEGS ARE MOVING. USUAL WEIGHT 147-150 BS BUT HASN'T WEIGHED LATELY. APPETITE: NOT SO GOOD. DOESN'T REALLY GET HUNGRY. NIECES/BROTHER TRIES TO HELP HER OUT. [place]. SWALLOWING PROBLEMS MAY GET FOOD OR LIQUID GOES THE WRONGS WAY: SOFT FOODS AND IT WANTS TO HANG(> 1-2X/MO). LAST EGD DEC 2017: SCHATZKI'S RING.  PT DENIES FEVER, CHILLS, HEMATOCHEZIA, HEMATEMESIS, nausea, vomiting, melena, diarrhea, CHEST PAIN, SHORTNESS OF BREATH,  CHANGE IN BOWEL IN HABITS, abdominal pain, problems with sedation, OR heartburn or  indigestion.  Past Medical History:  Diagnosis Date  . Arthritis    spinal stenosis  . Elevated LFTs    secondary to fatty liver, negative work-up in 2011  . Hypercholesterolemia   . Hypertension   . Lumbar spinal stenosis 01/14/2018   L3-4 and L4-5  . Myopathy 02/15/2018  . Osteoarthritis    right knee  . Peripheral neuropathy 07/06/2018  . PONV (postoperative nausea and vomiting)   . Uterine cancer ([place]) 08/2006   grade 1, no recurrence up to 2013    Past Surgical History:  Procedure Laterality Date  . 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 APPENDECTOMY  1973  . BREAST BIOPSY Left 2018   Benign  . CHOLECYSTECTOMY  1973  . COLONOSCOPY    06/21/2007   AXE:NMMHWK rectum/Sigmoid diverticula, diminutive hepatic flexure polyp s/p bx. Benign.   . COLONOSCOPY N/A 10/17/2012   Dr. Oneida Alar- moderate diverticulosis was noted in the sigmoid colon, moderate sized internal hemorrhoids. next tcs in10 years  . ESOPHAGOGASTRODUODENOSCOPY N/A 03/20/2016   Dr. Oneida Alar, widely patent Schatzki ring, anemia due to ASA induced erosive gastritis, mild duodenitis. gastric bx benign without H.pylori.   Marland Kitchen FOOT SURGERY  2007   Pins in toes on left foot, 5 to 6 yrs ago  . GIVENS CAPSULE STUDY N/A 03/20/2016   Procedure: GIVENS CAPSULE STUDY;  Surgeon: Danie Binder, MD;  Location: [facility];  Service: Endoscopy;  Laterality: N/A;  . MUSCLE BIOPSY Left 06/07/2018   Procedure: LEFT THIGH MUSCLE BIOPSY;  Surgeon: Erroll Luna, MD;  Location: [facility];  Service: General;  Laterality: Left;  . TONSILLECTOMY      Current Outpatient Medications  Medication Sig    . Chromium-Cinnamon [id number] MCG-MG CAPS Take 2 tablets by mouth daily.    Mariane Baumgarten Calcium (STOOL SOFTENER PO) Take by mouth daily as needed.    . Misc Natural Products (OSTEO BI-FLEX JOINT SHIELD) TABS Take 1 tablet by mouth daily.    . mycophenolate (CELLCEPT) 500 MG tablet Take 1 tablet (500 mg  total) by mouth 2 (two) times daily.    . predniSONE (DELTASONE) 10 MG tablet 4 tablets daily for 4 weeks, then take 3 tablets daily for 4 weeks, then take 2 tablets daily    . triamterene-hydrochlorothiazide (MAXZIDE) 75-50 MG tablet TAKE 1 TABLET EVERY DAY    . HYDROcodone-acetaminophen (NORCO/VICODIN) 5-325 MG tablet Take 1 tablet by mouth every 6 (six) hours as needed for moderate pain.       Review of Systems PER HPI OTHERWISE ALL SYSTEMS ARE NEGATIVE.    Objective:   Physical Exam  TELEPHONE VISIT DUE TO COVID 19, VISIT IS CONDUCTED VIRTUALLY AND WAS REQUESTED BY PATIENT.     Assessment & Plan:

## 2018-07-21 NOTE — Assessment & Plan Note (Signed)
SYMPTOMS FAIRLY WELL CONTROLLED.  CONTINUE TO MONITOR SYMPTOMS. PLEASE CALL WITH QUESTIONS OR CONCERNS REGARDING SWALLOWING. FOLLOW UP IN 6 MOS.

## 2018-07-21 NOTE — Patient Instructions (Addendum)
DRINK WATER TO KEEP YOUR URINE LIGHT YELLOW.  FOLLOW A HIGH FIBER DIET. AVOID ITEMS THAT CAUSE BLOATING & GAS. SEE INFO BELOW.  TO REDUCE CONSTIPATION, CONTINUE DOCUSATE AND ADD MIRALAX WITH YOUR FIRST MEAL EVERY MON, WED, & FRI. THE PRESCRIPTION WAS SENT TO [employer] TODAY.  PLEASE CALL WITH QUESTIONS OR CONCERNS REGARDING YOUR CONSTIPATION OR SWALLOWING.  FOLLOW UP IN 6 MOS.    High-Fiber Diet A high-fiber diet changes your normal diet to include more whole grains, legumes, fruits, and vegetables. Changes in the diet involve replacing refined carbohydrates with unrefined foods. The calorie level of the diet is essentially unchanged. The Dietary Reference Intake (recommended amount) for adult males is 38 grams per day. For adult females, it is 25 grams per day. Pregnant and lactating women should consume 28 grams of fiber per day. Fiber is the intact part of a plant that is not broken down during digestion. Functional fiber is fiber that has been isolated from the plant to provide a beneficial effect in the body.  PURPOSE  Increase stool bulk.   Ease and regulate bowel movements.   Lower cholesterol.   REDUCE RISK OF COLON CANCER  INDICATIONS THAT YOU NEED MORE FIBER  Constipation and hemorrhoids.   Uncomplicated diverticulosis (intestine condition) and irritable bowel syndrome.   Weight management.   As a protective measure against hardening of the arteries (atherosclerosis), diabetes, and cancer.   GUIDELINES FOR INCREASING FIBER IN THE DIET  Start adding fiber to the diet slowly. A gradual increase of about 5 more grams (2 slices of whole-wheat bread, 2 servings of most fruits or vegetables, or 1 bowl of high-fiber cereal) per day is best. Too rapid an increase in fiber may result in constipation, flatulence, and bloating.   Drink enough water and fluids to keep your urine clear or pale yellow. Water, juice, or caffeine-free drinks are recommended. Not drinking enough fluid may  cause constipation.   Eat a variety of high-fiber foods rather than one type of fiber.   Try to increase your intake of fiber through using high-fiber foods rather than fiber pills or supplements that contain small amounts of fiber.   The goal is to change the types of food eaten. Do not supplement your present diet with high-fiber foods, but replace foods in your present diet.    INCLUDE A VARIETY OF FIBER SOURCES  Replace refined and processed grains with whole grains, canned fruits with fresh fruits, and incorporate other fiber sources. White rice, white breads, and most bakery goods contain little or no fiber.   Brown whole-grain rice, buckwheat oats, and many fruits and vegetables are all good sources of fiber. These include: broccoli, Brussels sprouts, cabbage, cauliflower, beets, sweet potatoes, white potatoes (skin on), carrots, tomatoes, eggplant, squash, berries, fresh fruits, and dried fruits.   Cereals appear to be the richest source of fiber. Cereal fiber is found in whole grains and bran. Bran is the fiber-rich outer coat of cereal grain, which is largely removed in refining. In whole-grain cereals, the bran remains. In breakfast cereals, the largest amount of fiber is found in those with "bran" in their names. The fiber content is sometimes indicated on the label.   You may need to include additional fruits and vegetables each day.   In baking, for 1 cup white flour, you may use the following substitutions:   1 cup whole-wheat flour minus 2 tablespoons.   1/2 cup white flour plus 1/2 cup whole-wheat flour.

## 2018-07-21 NOTE — Assessment & Plan Note (Signed)
SYMPTOMS NOT IDEALLY CONTROLLED OM DOCUSATE DAILY.  DRINK WATER TO KEEP YOUR URINE LIGHT YELLOW.  FOLLOW A HIGH FIBER DIET. AVOID ITEMS THAT CAUSE BLOATING & GAS.  HANDOUT GIVEN. TO REDUCE CONSTIPATION, CONTINUE DOCUSATE AND ADD MIRALAX WITH YOUR FIRST MEAL EVERY MON, WED, & FRI. THE PRESCRIPTION WAS SENT TO [employer] TODAY. PLEASE CALL WITH QUESTIONS OR CONCERNS REGARDING YOUR CONSTIPATION. FOLLOW UP IN 6 MOS.

## 2018-07-21 NOTE — Assessment & Plan Note (Signed)
UNABLE TO WEIGHT RECENTLY.  CONTINUE TO MONITOR SYMPTOMS. FOLLOW UP IN 6 MOS.

## 2018-07-21 NOTE — Progress Notes (Signed)
CC'ED TO PCP 

## 2018-07-21 NOTE — Progress Notes (Signed)
ON RECALL  °

## 2018-07-21 NOTE — Assessment & Plan Note (Signed)
SYMPTOMS CONTROLLED/RESOLVED WITH DIET.  CONTINUE TO MONITOR SYMPTOMS. FOLLOW UP IN 6 MOS.

## 2018-08-04 ENCOUNTER — Telehealth: Payer: Self-pay | Admitting: Neurology

## 2018-08-04 DIAGNOSIS — Z5181 Encounter for therapeutic drug level monitoring: Secondary | ICD-10-CM

## 2018-08-04 NOTE — Telephone Encounter (Signed)
I contacted the pt.  She states for the last week she has been having soreness in her mouth along with red splotchy patches. Patient reports the soreness and patches are making it difficult for her to eat.  Pt also reports bruising along her arms and legs.  Patient also reports she has had an increase if flatulence and joint pain. Pt states join pain is located between her shoulder/elbows.  Patient wanted to know if Dr. Jannifer Franklin thought Cellcept could be attributing to new symptoms?  Pt's preferred pharm is [facility].  I advised I would fwd to MD to review and advise on, pt was agreeable.

## 2018-08-04 NOTE — Telephone Encounter (Signed)
I called the patient.  The patient is having some soreness in the mouth, difficulty with eating, bruising on arms and legs and leg cramps, she will stop the CellCept, she will come in for blood work.  We may have to switch to another medication such as methotrexate.

## 2018-08-04 NOTE — Telephone Encounter (Signed)
Pt called in and stated she is having symptoms from mycophenolate (CELLCEPT) 500 MG tablet soreness in her tongue and bruising and also joint pain

## 2018-08-08 ENCOUNTER — Telehealth: Payer: Self-pay | Admitting: Neurology

## 2018-08-08 NOTE — Telephone Encounter (Signed)
I contacted the pt and inquired if we could send the orders to [facility] in [place]. And pt was agreeable. Orders have been faxed to [facility] fax # [phone]. Confirmation received this is the [facility] on [address].

## 2018-08-08 NOTE — Telephone Encounter (Signed)
Patient called and would like lab order mailed to her and she will take to [facility] lab in [place] .

## 2018-08-09 DIAGNOSIS — Z5181 Encounter for therapeutic drug level monitoring: Secondary | ICD-10-CM | POA: Diagnosis not present

## 2018-08-09 NOTE — Addendum Note (Signed)
Addended by: Inis Sizer D on: 08/09/2018 10:31 AM   Modules accepted: Orders

## 2018-08-10 ENCOUNTER — Telehealth: Payer: Self-pay | Admitting: Neurology

## 2018-08-10 LAB — COMPREHENSIVE METABOLIC PANEL
ALT: 18 IU/L (ref 0–32)
AST: 19 IU/L (ref 0–40)
Albumin/Globulin Ratio: 1.8 (ref 1.2–2.2)
Albumin: 4.2 g/dL (ref 3.6–4.6)
Alkaline Phosphatase: 49 IU/L (ref 39–117)
BUN/Creatinine Ratio: 35 — ABNORMAL HIGH (ref 12–28)
BUN: 31 mg/dL — ABNORMAL HIGH (ref 8–27)
Bilirubin Total: 0.9 mg/dL (ref 0.0–1.2)
CO2: 23 mmol/L (ref 20–29)
Calcium: 9.9 mg/dL (ref 8.7–10.3)
Chloride: 100 mmol/L (ref 96–106)
Creatinine, Ser: 0.88 mg/dL (ref 0.57–1.00)
GFR calc Af Amer: 70 mL/min/{1.73_m2} (ref >59)
GFR calc non Af Amer: 61 mL/min/{1.73_m2} (ref >59)
Globulin, Total: 2.4 g/dL (ref 1.5–4.5)
Glucose: 106 mg/dL — ABNORMAL HIGH (ref 65–99)
Potassium: 3.9 mmol/L (ref 3.5–5.2)
Sodium: 140 mmol/L (ref 134–144)
Total Protein: 6.6 g/dL (ref 6.0–8.5)

## 2018-08-10 LAB — CBC WITH DIFFERENTIAL/PLATELET
Basophils Absolute: 0 10*3/uL (ref 0.0–0.2)
Basos: 0 %
EOS (ABSOLUTE): 0 10*3/uL (ref 0.0–0.4)
Eos: 0 %
Hematocrit: 40.8 % (ref 34.0–46.6)
Hemoglobin: 13.8 g/dL (ref 11.1–15.9)
Immature Grans (Abs): 0 10*3/uL (ref 0.0–0.1)
Immature Granulocytes: 0 %
Lymphocytes Absolute: 0.8 10*3/uL (ref 0.7–3.1)
Lymphs: 11 %
MCH: 29.6 pg (ref 26.6–33.0)
MCHC: 33.8 g/dL (ref 31.5–35.7)
MCV: 88 fL (ref 79–97)
Monocytes Absolute: 0.5 10*3/uL (ref 0.1–0.9)
Monocytes: 6 %
Neutrophils Absolute: 6.6 10*3/uL (ref 1.4–7.0)
Neutrophils: 83 %
Platelets: 174 10*3/uL (ref 150–450)
RBC: 4.66 x10E6/uL (ref 3.77–5.28)
RDW: 12.7 % (ref 11.7–15.4)
WBC: 7.9 10*3/uL (ref 3.4–10.8)

## 2018-08-10 LAB — CK: Total CK: 56 U/L (ref 26–161)

## 2018-08-10 MED ORDER — FOLIC ACID 1 MG PO TABS: 1.0000 mg | ORAL_TABLET | Freq: Every day | ORAL | 3 refills | Status: DC

## 2018-08-10 MED ORDER — METHOTREXATE 2.5 MG PO TABS: 7.5000 mg | ORAL_TABLET | ORAL | 3 refills | Status: DC

## 2018-08-10 NOTE — Telephone Encounter (Signed)
I called the patient.  The blood work is normal, the muscle enzyme levels have now normalized, she currently is on 30 mg of prednisone, she will be going down to 20 mg eventually.  She could not tolerate the CellCept, coming off of it has improved her symptoms but she still does not feel 100%.  She will wait 1 week and then start methotrexate 7.5 mg once a week.  We will follow-up in the next couple months.  The goal is to try to get her down to a much lower level of prednisone and still maintain therapeutic benefit with the inflammatory myopathy.  A prescription for folic acid was also sent in.

## 2018-08-11 ENCOUNTER — Ambulatory Visit (INDEPENDENT_AMBULATORY_CARE_PROVIDER_SITE_OTHER): Payer: Medicare HMO | Admitting: Family Medicine

## 2018-08-11 ENCOUNTER — Telehealth: Payer: Self-pay

## 2018-08-11 ENCOUNTER — Encounter: Payer: Self-pay | Admitting: Family Medicine

## 2018-08-11 VITALS — BP 104/70 | Ht 66.0 in | Wt 150.0 lb

## 2018-08-11 DIAGNOSIS — E785 Hyperlipidemia, unspecified: Secondary | ICD-10-CM

## 2018-08-11 DIAGNOSIS — G7249 Other inflammatory and immune myopathies, not elsewhere classified: Secondary | ICD-10-CM | POA: Diagnosis not present

## 2018-08-11 DIAGNOSIS — I10 Essential (primary) hypertension: Secondary | ICD-10-CM | POA: Diagnosis not present

## 2018-08-11 DIAGNOSIS — Z7189 Other specified counseling: Secondary | ICD-10-CM

## 2018-08-11 DIAGNOSIS — R7303 Prediabetes: Secondary | ICD-10-CM | POA: Diagnosis not present

## 2018-08-11 DIAGNOSIS — E559 Vitamin D deficiency, unspecified: Secondary | ICD-10-CM | POA: Diagnosis not present

## 2018-08-11 DIAGNOSIS — M5387 Other specified dorsopathies, lumbosacral region: Secondary | ICD-10-CM

## 2018-08-11 DIAGNOSIS — Z1231 Encounter for screening mammogram for malignant neoplasm of breast: Secondary | ICD-10-CM | POA: Diagnosis not present

## 2018-08-11 DIAGNOSIS — Z5309 Procedure and treatment not carried out because of other contraindication: Secondary | ICD-10-CM | POA: Diagnosis not present

## 2018-08-11 MED ORDER — GABAPENTIN 100 MG PO CAPS: 100.0000 mg | ORAL_CAPSULE | Freq: Every day | ORAL | 3 refills | Status: DC

## 2018-08-11 MED ORDER — EZETIMIBE 10 MG PO TABS: 10.0000 mg | ORAL_TABLET | Freq: Every day | ORAL | 3 refills | Status: DC

## 2018-08-11 NOTE — Telephone Encounter (Signed)
Called back and wanted me to let you know that the dr took her off of the atorvastatin thinking it may be contributing to her muscle pain and that may be why her cholesterol is up

## 2018-08-11 NOTE — Telephone Encounter (Signed)
noted 

## 2018-08-11 NOTE — Telephone Encounter (Signed)
I realized that, you may need to PA the zetia I sent in , so pls be omn the alert

## 2018-08-11 NOTE — Patient Instructions (Addendum)
F/U with MD3 rd week in October, please call if you need me sooner  TWE new medications are prescribed, as we discussed  Ezetimibe one daily for cholesterol  Gabapentin 100 mg one daily for sciatic pain  Please get fasting lipid, cmp and eGFr , HBA1C, TSH and vit D level 1 week before next visit  Be careful not to fall at home, use your assistive devices, walker and cane , and get the help that you need   Social distancing.Maintain a 6 foot distance Frequent hand washing with soap and water Keeping your hands off of your face.wear a face mask outside of your home These 3 practices will help to keep both you and your community healthy during this time. Please practice them faithfully!  Thanks for choosing [facility], we consider it a privelige to serve you.

## 2018-08-14 DIAGNOSIS — Z7189 Other specified counseling: Secondary | ICD-10-CM | POA: Insufficient documentation

## 2018-08-14 DIAGNOSIS — Z5309 Procedure and treatment not carried out because of other contraindication: Secondary | ICD-10-CM | POA: Insufficient documentation

## 2018-08-14 NOTE — Assessment & Plan Note (Signed)
With new dx of myositis, statin use is contraindicated

## 2018-08-14 NOTE — Assessment & Plan Note (Signed)
Controlled, no change in medication DASH diet and commitment to daily physical activity for a minimum of 30 minutes discussed and encouraged, as a part of hypertension management. The importance of attaining a healthy weight is also discussed.  BP/Weight 08/11/2018 06/07/2018 05/03/2018 01/14/2018 01/12/2018 12/30/2017 0/21/1155  Systolic BP 208 022 336 122 449 753 005  Diastolic BP 70 66 70 60 78 70 79  Wt. (Lbs) 150 151.02 150 150.5 152 150.12 152.4  BMI 24.21 24.37 24.21 24.29 24.53 24.23 24.6

## 2018-08-14 NOTE — Assessment & Plan Note (Signed)
Covid-19 Education  The signs and symptoms of of COVID -19 were discussed with the patient and how to seek care for testing. ( follow up with PCP or arrange  E-visit) The importance of social  distancing is discussed today.

## 2018-08-14 NOTE — Assessment & Plan Note (Addendum)
Gabapentin prescribed uncontrolled , keeps her awake at night

## 2018-08-14 NOTE — Assessment & Plan Note (Signed)
Hyperlipidemia:Low fat diet discussed and encouraged.   Lipid Panel  Lab Results  Component Value Date   CHOL 273 (H) 05/03/2018   HDL 96 05/03/2018   LDLCALC 161 (H) 05/03/2018   TRIG 64 05/03/2018   CHOLHDL 2.8 05/03/2018  start zetia

## 2018-08-14 NOTE — Assessment & Plan Note (Signed)
New diagnosis and being treated by Neurology, reports significant muscle weakness

## 2018-08-14 NOTE — Progress Notes (Addendum)
Virtual Visit via Telephone Note  I connected with TAKASHA VETERE on 08/14/18 at  1:40 PM EDT by telephone and verified that I am speaking with the correct person using two identifiers.  Location: Patient: home Provider: office   I discussed the limitations, risks, security and privacy concerns of performing an evaluation and management service by telephone and the availability of in person appointments. I also discussed with the patient that there may be a patient responsible charge related to this service. The patient expressed understanding and agreed to proceed. This visit type is conducted due to national recommendations for restrictions regarding the COVID -19 Pandemic. Due to the patient's age and / or co morbidities, this format is felt to be most appropriate at this time without adequate follow up. The patient has no access to video technology/ had technical difficulties with video, requiring transitioning to audio format  only ( telephone ). All issues noted this document were discussed and addressed,no physical exam can be performed in this format.   History of Present Illness: F/U chronic problems , review of recent consultations and labs I am having uncontrolled back pain Medication I was started on for my myositis was intolerable, and I am being started on new medication I am requesting a lift chair due to the  severe arthritis in my back, my severe neuromuscular disease. I am unable to stand from any chair in my home, however once I stand , I am able to get around with a walker Denies recent fever or chills. Denies sinus pressure, nasal congestion, ear pain or sore throat. Denies chest congestion, productive cough or wheezing. Denies chest pains, palpitations and leg swelling Denies abdominal pain, nausea, vomiting,diarrhea or constipation.   Denies dysuria, frequency, hesitancy or incontinence. Severe  joint pain, muscle weaknessand limitation in mobility. Denies headaches,  seizures, numbness, or tingling. Denies depression, anxiety or insomnia. Denies skin break down or rash.       Observations/Objective: BP 104/70   Ht 5' 6"  (1.676 m)   Wt 150 lb (68 kg)   BMI 24.21 kg/m  Good communication with no confusion and intact memory. Alert and oriented x 3 No signs of respiratory distress during sppech    Assessment and Plan: Essential hypertension Controlled, no change in medication DASH diet and commitment to daily physical activity for a minimum of 30 minutes discussed and encouraged, as a part of hypertension management. The importance of attaining a healthy weight is also discussed.  BP/Weight 08/11/2018 06/07/2018 05/03/2018 01/14/2018 01/12/2018 12/30/2017 8/84/1660  Systolic BP 630 160 109 323 557 322 025  Diastolic BP 70 66 70 60 78 70 79  Wt. (Lbs) 150 151.02 150 150.5 152 150.12 152.4  BMI 24.21 24.37 24.21 24.29 24.53 24.23 24.6       Hyperlipidemia LDL goal <100 Hyperlipidemia:Low fat diet discussed and encouraged.   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 ( follow up with PCP or arrange  E-visit) The importance of social  distancing is discussed today.     Follow Up Instructions:    I discussed the assessment and treatment plan with the patient. The patient was provided an opportunity to ask  questions and all were answered. The patient agreed with the plan and demonstrated an understanding of the instructions.   The patient was advised to call back or seek an in-person evaluation if the symptoms worsen or if the condition fails to improve as anticipated.  I provided 22 minutes of non-face-to-face time during this encounter.   [name], MD

## 2018-08-19 ENCOUNTER — Encounter: Payer: Self-pay | Admitting: *Deleted

## 2018-09-14 ENCOUNTER — Ambulatory Visit: Payer: Medicare HMO | Admitting: Family Medicine

## 2018-09-19 ENCOUNTER — Telehealth: Payer: Self-pay | Admitting: Neurology

## 2018-09-19 ENCOUNTER — Telehealth: Payer: Self-pay | Admitting: Family Medicine

## 2018-09-19 NOTE — Telephone Encounter (Signed)
Pt is requesting a call back to discuss current meds she is on

## 2018-09-19 NOTE — Telephone Encounter (Signed)
Pt is calling regarding lift chair, please advise

## 2018-09-19 NOTE — Telephone Encounter (Signed)
Attempted to reach the pt and received a busy signal. Will try again at a later time.

## 2018-09-20 NOTE — Telephone Encounter (Signed)
I reached out the pt and scheduled her for 10/24/18 at 2:30 check in time of 2 pm. Advised to bring face mask.

## 2018-09-20 NOTE — Telephone Encounter (Signed)
Patient is wanting an order for a lift chair. Just had a telephone visit in May.

## 2018-09-20 NOTE — Telephone Encounter (Signed)
I called the patient.  The patient has had some ongoing problems with bruising and swelling on the prednisone and methotrexate, this likely is mainly related to prednisone.  She has gone down to taking 10 mg of prednisone daily.  More recently she has reported some discomfort in the back of the thighs bilaterally.  Her next revisit is not until October 2020, I will try to get her worked in sooner in the next couple weeks to reevaluate the above issues, and recheck blood work.  The most recent blood work in May 2020 indicated that the muscle enzyme level had normalized.

## 2018-09-20 NOTE — Telephone Encounter (Signed)
I reached out to the pt. She states she is still struggling with bruising like spots on her legs/ arms, decreased taste and swelling of her legs and feet.  Pt believes these sx are related to her methotrexate and wanted to know Dr. Jannifer Franklin thoughts? Pt also is concerned about not being able to take ibuprofen/naproxen while on this medication. Pt states she is unsure what to take for common body aches and headaches.

## 2018-10-06 NOTE — Telephone Encounter (Signed)
I have addended  the note to help to qualify her for a lift chari which she does need. Pls submit with the request for the chair, and let her know

## 2018-10-11 ENCOUNTER — Other Ambulatory Visit: Payer: Self-pay

## 2018-10-18 ENCOUNTER — Other Ambulatory Visit: Payer: Self-pay

## 2018-10-18 MED ORDER — UNABLE TO FIND: 0 refills | Status: DC

## 2018-10-18 NOTE — Telephone Encounter (Signed)
Sent to advanced

## 2018-10-24 ENCOUNTER — Encounter: Payer: Self-pay | Admitting: Neurology

## 2018-10-24 ENCOUNTER — Other Ambulatory Visit: Payer: Self-pay

## 2018-10-24 ENCOUNTER — Ambulatory Visit (INDEPENDENT_AMBULATORY_CARE_PROVIDER_SITE_OTHER): Payer: Medicare HMO | Admitting: Neurology

## 2018-10-24 VITALS — BP 124/62 | HR 91 | Temp 98.1°F | Ht 66.25 in | Wt 145.5 lb

## 2018-10-24 DIAGNOSIS — G7249 Other inflammatory and immune myopathies, not elsewhere classified: Secondary | ICD-10-CM

## 2018-10-24 DIAGNOSIS — Z5181 Encounter for therapeutic drug level monitoring: Secondary | ICD-10-CM | POA: Diagnosis not present

## 2018-10-24 DIAGNOSIS — G609 Hereditary and idiopathic neuropathy, unspecified: Secondary | ICD-10-CM | POA: Diagnosis not present

## 2018-10-24 MED ORDER — METHOTREXATE 2.5 MG PO TABS: 10.0000 mg | ORAL_TABLET | ORAL | 1 refills | Status: DC

## 2018-10-24 NOTE — Progress Notes (Signed)
Reason for visit: Inflammatory myopathy, peripheral neuropathy  [name] [name] is an 85 y.o. female  History of present illness:  [name] [name] is an 85 year old right-handed black female with a history of progressive weakness of the legs and arms, the patient has a gait disorder associated with this.  She uses a walker for ambulation.  Nerve conduction studies showed evidence of a peripheral neuropathy but the patient also appeared to have myopathic motor units, a muscle biopsy confirmed the presence of an inflammatory myopathy.  The patient was started on CellCept and prednisone but developed a rash in the mouth from the CellCept and this was stopped.  She has been switched to methotrexate, the prednisone dose was reduced to 10 mg daily.  The last CK enzyme level in May have returned to normal.  The patient has not had any falls, she continues to have proximal leg weakness.  She has difficulty getting up from a seated position.  She denies any difficulty with swallowing.  She returns for an evaluation.  Past Medical History:  Diagnosis Date  . Arthritis    spinal stenosis  . Elevated LFTs    secondary to fatty liver, negative work-up in 2011  . Hypercholesterolemia   . Hypertension   . Lumbar spinal stenosis 01/14/2018   L3-4 and L4-5  . Myopathy 02/15/2018  . Osteoarthritis    right knee  . Peripheral neuropathy 07/06/2018  . PONV (postoperative nausea and vomiting)   . Uterine cancer ([place]) 08/2006   grade 1, no recurrence up to 2013    Past Surgical History:  Procedure Laterality Date  . 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 APPENDECTOMY  1973  . BREAST BIOPSY Left 2018   Benign  . CHOLECYSTECTOMY  1973  . COLONOSCOPY    06/21/2007   BOF:BPZWCH rectum/Sigmoid diverticula, diminutive hepatic flexure polyp s/p bx. Benign.   . COLONOSCOPY N/A 10/17/2012   Dr. Oneida Alar- moderate diverticulosis was noted in the sigmoid colon, moderate sized internal hemorrhoids.  next tcs in10 years  . ESOPHAGOGASTRODUODENOSCOPY N/A 03/20/2016   Dr. Oneida Alar, widely patent Schatzki ring, anemia due to ASA induced erosive gastritis, mild duodenitis. gastric bx benign without H.pylori.   Marland Kitchen FOOT SURGERY  2007   Pins in toes on left foot, 5 to 6 yrs ago  . GIVENS CAPSULE STUDY N/A 03/20/2016   Procedure: GIVENS CAPSULE STUDY;  Surgeon: Danie Binder, MD;  [facility]: [facility];  Service: Endoscopy;  Laterality: N/A;  . MUSCLE BIOPSY Left 06/07/2018   Procedure: LEFT THIGH MUSCLE BIOPSY;  Surgeon: Erroll Luna, MD;  Location: [place];  Service: General;  Laterality: Left;  . TONSILLECTOMY      Family History  Problem Relation Age of Onset  . Heart disease Father   . Bladder Cancer Brother        in remission   . Hypertension Sister   . Dementia Sister   . Diabetes type II Sister   . Hypertension Sister   . Aneurysm Sister   . Hypertension Brother   . Hypertension Son   . Colon cancer Neg Hx     Social history:  reports that she has never smoked. She has never used smokeless tobacco. She reports that she does not drink alcohol or use drugs.    Allergies  Allergen Reactions  . Cellcept [Mycophenolate Mofetil]     Muscle cramps, mouth soreness, difficulty swallowing, bruising  . Statins  Dx of in  . Ciprofloxacin Hcl Other (See Comments)    Chills, sick, could not tolerate it  . Hydrocodone Nausea Only  . Penicillins Rash    Medications:  Prior to Admission medications   Medication Sig Start Date End Date Taking? Authorizing Provider  Chromium-Cinnamon [phone] MCG-MG CAPS Take 2 tablets by mouth daily.   Yes [provider]  Docusate Calcium (STOOL SOFTENER PO) Take by mouth daily as needed.   Yes [provider]  ezetimibe (ZETIA) 10 MG tablet Take 1 tablet (10 mg total) by mouth daily. 08/11/18  Yes Fayrene Helper, MD  folic acid (FOLVITE) 1 MG tablet Take 1 tablet (1 mg total) by mouth daily. 08/10/18   Yes Kathrynn Ducking, MD  gabapentin (NEURONTIN) 100 MG capsule Take 1 capsule (100 mg total) by mouth at bedtime. 08/11/18  Yes Fayrene Helper, MD  methotrexate (RHEUMATREX) 2.5 MG tablet Take 3 tablets (7.5 mg total) by mouth once a week. Caution:Chemotherapy. Protect from light. 08/10/18  Yes Kathrynn Ducking, MD  Misc Natural Products (OSTEO BI-FLEX JOINT SHIELD) TABS Take 1 tablet by mouth daily.   Yes [provider]  polyethylene glycol powder (MIRALAX) 17 GM/SCOOP powder 1 SCOOP IN 8 OZ LIQUID EVERY MON, WED, AND FRI. HOLD FOR DIARRHEA. 07/21/18  Yes Fields, Marga Melnick, MD  predniSONE (DELTASONE) 10 MG tablet 4 tablets daily for 4 weeks, then take 3 tablets daily for 4 weeks, then take 2 tablets daily Patient taking differently: Taking 1 tablet daily 07/06/18  Yes Kathrynn Ducking, MD  triamterene-hydrochlorothiazide [facility]) 75-50 MG tablet TAKE 1 TABLET EVERY DAY 06/27/18  Yes Fayrene Helper, MD  UNABLE TO FIND Lift chair x 1  DX M48.06, M53.87 10/18/18  Yes Fayrene Helper, MD    ROS:  Out of a complete 14 system review of symptoms, the patient complains only of the following symptoms, and all other reviewed systems are negative.  Weakness Walking problems Knee pain  Blood pressure 124/62, pulse 91, temperature 98.1 F (36.7 C), temperature source Temporal, height 5' 6.25" (1.683 m), weight 145 lb 8 oz (66 kg), SpO2 98 %.  Physical Exam  General: The patient is alert and cooperative at the time of the examination.  Skin: No significant peripheral edema is noted.   Neurologic Exam  Mental status: The patient is alert and oriented x 3 at the time of the examination. The patient has apparent normal recent and remote memory, with an apparently normal attention span and concentration ability.   Cranial nerves: Facial symmetry is present. Speech is normal, no aphasia or dysarthria is noted. Extraocular movements are full. Visual fields are full.  Motor: The  patient has good strength in the upper extremities, with exception that the patient has some pain with abduction of the left shoulder with giveaway weakness.  With the lower extremities, she has 3/5 strength with hip flexion bilaterally, 4/5 strength with adductor strength.  Patient has fairly good strength with extension at the knee and with dorsiflexion of the feet.  Sensory examination: Soft touch sensation is symmetric on the face, arms, and legs.  Coordination: The patient has good finger-nose-finger bilaterally.  The patient is not able to perform heel-to-shin on either side.  Gait and station: The patient has difficulty arising from a seated position.  She has to push off with her arms, once up, she is able to walk with a walker.  Tandem gait was not attempted.  Reflexes: Deep tendon reflexes  are symmetric.   Assessment/Plan:  1.  Peripheral neuropathy  2.  Inflammatory myositis  3.  Gait disorder  The patient will be set up for blood work today.  We will go up on the methotrexate to 10 mg once a week, if the blood work is unremarkable, may taper the prednisone slightly, she currently is on 10 mg daily.  She will follow-up in 4 months.  Jill Alexanders MD 10/24/2018 3:11 PM  [facility] [address] [place], [place] [address]  Phone [phone] Fax [phone]

## 2018-10-24 NOTE — Patient Instructions (Signed)
We will go up on the methotrexate to 4 tablets once a week.

## 2018-10-25 ENCOUNTER — Telehealth: Payer: Self-pay | Admitting: Neurology

## 2018-10-25 LAB — COMPREHENSIVE METABOLIC PANEL
ALT: 19 IU/L (ref 0–32)
AST: 25 IU/L (ref 0–40)
Albumin/Globulin Ratio: 2.7 — ABNORMAL HIGH (ref 1.2–2.2)
Albumin: 4.3 g/dL (ref 3.6–4.6)
Alkaline Phosphatase: 43 IU/L (ref 39–117)
BUN/Creatinine Ratio: 32 — ABNORMAL HIGH (ref 12–28)
BUN: 32 mg/dL — ABNORMAL HIGH (ref 8–27)
Bilirubin Total: 0.6 mg/dL (ref 0.0–1.2)
CO2: 23 mmol/L (ref 20–29)
Calcium: 10.3 mg/dL (ref 8.7–10.3)
Chloride: 100 mmol/L (ref 96–106)
Creatinine, Ser: 1 mg/dL (ref 0.57–1.00)
GFR calc Af Amer: 60 mL/min/{1.73_m2} (ref >59)
GFR calc non Af Amer: 52 mL/min/{1.73_m2} — ABNORMAL LOW (ref >59)
Globulin, Total: 1.6 g/dL (ref 1.5–4.5)
Glucose: 94 mg/dL (ref 65–99)
Potassium: 4 mmol/L (ref 3.5–5.2)
Sodium: 142 mmol/L (ref 134–144)
Total Protein: 5.9 g/dL — ABNORMAL LOW (ref 6.0–8.5)

## 2018-10-25 LAB — CBC WITH DIFFERENTIAL/PLATELET
Basophils Absolute: 0 10*3/uL (ref 0.0–0.2)
Basos: 0 %
EOS (ABSOLUTE): 0.1 10*3/uL (ref 0.0–0.4)
Eos: 2 %
Hematocrit: 34.1 % (ref 34.0–46.6)
Hemoglobin: 11.6 g/dL (ref 11.1–15.9)
Immature Grans (Abs): 0 10*3/uL (ref 0.0–0.1)
Immature Granulocytes: 1 %
Lymphocytes Absolute: 0.7 10*3/uL (ref 0.7–3.1)
Lymphs: 12 %
MCH: 30 pg (ref 26.6–33.0)
MCHC: 34 g/dL (ref 31.5–35.7)
MCV: 88 fL (ref 79–97)
Monocytes Absolute: 0.6 10*3/uL (ref 0.1–0.9)
Monocytes: 11 %
Neutrophils Absolute: 4 10*3/uL (ref 1.4–7.0)
Neutrophils: 74 %
Platelets: 166 10*3/uL (ref 150–450)
RBC: 3.87 x10E6/uL (ref 3.77–5.28)
RDW: 13.2 % (ref 11.7–15.4)
WBC: 5.4 10*3/uL (ref 3.4–10.8)

## 2018-10-25 LAB — CK: Total CK: 128 U/L (ref 26–161)

## 2018-10-25 MED ORDER — PREDNISONE 5 MG PO TABS: 5.0000 mg | ORAL_TABLET | Freq: Every day | ORAL | 2 refills | Status: DC

## 2018-10-25 MED ORDER — PREDNISONE 1 MG PO TABS: ORAL_TABLET | ORAL | 3 refills | Status: DC

## 2018-10-25 NOTE — Telephone Encounter (Signed)
I called the patient.  The CK enzyme level is well within normal limits, we will continue a prednisone taper, we have gone up on the methotrexate.  The chemistry panel did show a slightly low total protein, liver enzymes were unremarkable.  We will taper her off of the prednisone by 1 mg every 6 weeks until she gets to 5 mg daily.  Prescriptions for the 5 mg and for the 1 mg prednisone tablets were sent in.

## 2018-10-27 ENCOUNTER — Telehealth: Payer: Self-pay | Admitting: Neurology

## 2018-10-27 NOTE — Telephone Encounter (Signed)
Called patient and advised she get pen, paper to write down taper instructions. She stated she was ready. I reviewed prednisone taper several times. She was looking at her bottles as we discussed taper. She verbalzied understanding at end of conversation. I advised she call if she has any questions. Patient verbalized understanding, appreciation.

## 2018-10-27 NOTE — Telephone Encounter (Signed)
Pt states she is unclear as to how she is to take the predniSONE (DELTASONE)  She is asking RN to call to explain

## 2018-10-31 ENCOUNTER — Other Ambulatory Visit: Payer: Self-pay | Admitting: Family Medicine

## 2018-12-13 ENCOUNTER — Telehealth: Payer: Self-pay | Admitting: *Deleted

## 2018-12-13 NOTE — Telephone Encounter (Signed)
Pt called returning Dr Camillia Herter call. Her phone was messed up this morning when she called.

## 2018-12-13 NOTE — Telephone Encounter (Signed)
Pt returning call

## 2018-12-13 NOTE — Telephone Encounter (Signed)
Direct contact with patient to express concern and condolence re her spouse's passing. States she fell last Saturday, but no bony injury or skin breakdown Has appt in 1 week, she is aware

## 2018-12-20 ENCOUNTER — Encounter: Payer: Self-pay | Admitting: Gastroenterology

## 2018-12-22 ENCOUNTER — Ambulatory Visit (INDEPENDENT_AMBULATORY_CARE_PROVIDER_SITE_OTHER): Payer: Medicare HMO | Admitting: Neurology

## 2018-12-22 ENCOUNTER — Other Ambulatory Visit: Payer: Self-pay

## 2018-12-22 ENCOUNTER — Encounter: Payer: Self-pay | Admitting: Neurology

## 2018-12-22 ENCOUNTER — Other Ambulatory Visit: Payer: Self-pay | Admitting: Neurology

## 2018-12-22 VITALS — BP 129/79 | HR 100 | Temp 98.6°F | Ht 65.5 in | Wt 140.0 lb

## 2018-12-22 DIAGNOSIS — M48062 Spinal stenosis, lumbar region with neurogenic claudication: Secondary | ICD-10-CM | POA: Diagnosis not present

## 2018-12-22 DIAGNOSIS — G7249 Other inflammatory and immune myopathies, not elsewhere classified: Secondary | ICD-10-CM | POA: Diagnosis not present

## 2018-12-22 DIAGNOSIS — G609 Hereditary and idiopathic neuropathy, unspecified: Secondary | ICD-10-CM

## 2018-12-22 MED ORDER — GABAPENTIN 100 MG PO CAPS: 100.0000 mg | ORAL_CAPSULE | Freq: Three times a day (TID) | ORAL | 1 refills | Status: DC

## 2018-12-22 NOTE — Patient Instructions (Signed)
We will go up on the gabapentin 100 mg capsule to take one three times a day.  Neurontin (gabapentin) may result in drowsiness, ankle swelling, gait instability, or possibly dizziness. Please contact our office if significant side effects occur with this medication.

## 2018-12-22 NOTE — Progress Notes (Signed)
Reason for visit: Inflammatory myopathy, peripheral neuropathy, lumbar spinal stenosis  [name] [name] is an 85 y.o. female  History of present illness:  [name] [name] is an 85 year old right-handed black female with a history of lumbar spinal stenosis at the L3-4 and at the L4-5 level, she did not have decompressive surgery on the back.  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  She has been treated with prednisone, she is on a slow taper, currently at 8 mg daily.  The patient is on methotrexate 10 mg once a week.  She has had normalization of her muscle enzyme levels, she continues to have weakness proximally in both legs, she fell 2 weeks ago but did not injure herself.  She normally walks with a walker.  Her husband passed away 2 weeks ago, she has been under some stress, she is not sleeping well.  She has had an increase in her back pain and pain down the legs, left greater than right.  She takes gabapentin 100 mg at night.  She returns to this office for an evaluation.  She currently is living alone, she indicates she may need to change her living situation.  Past Medical History:  Diagnosis Date  . Arthritis    spinal stenosis  . Elevated LFTs    secondary to fatty liver, negative work-up in 2011  . Hypercholesterolemia   . Hypertension   . Lumbar spinal stenosis 01/14/2018   L3-4 and L4-5  . Myopathy 02/15/2018  . Osteoarthritis    right knee  . Peripheral neuropathy 07/06/2018  . PONV (postoperative nausea and vomiting)   . Uterine cancer ([place]) 08/2006   grade 1, no recurrence up to 2013    Past Surgical History:  Procedure Laterality Date  . 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 APPENDECTOMY  1973  . BREAST BIOPSY Left 2018   Benign  . CHOLECYSTECTOMY  1973   . COLONOSCOPY    06/21/2007   SWN:IOEVOJ rectum/Sigmoid diverticula, diminutive hepatic flexure polyp s/p bx. Benign.   . COLONOSCOPY N/A 10/17/2012   Dr. Oneida Alar- moderate diverticulosis was noted in the sigmoid colon, moderate sized internal hemorrhoids. next tcs in10 years  . ESOPHAGOGASTRODUODENOSCOPY N/A 03/20/2016   Dr. Oneida Alar, widely patent Schatzki ring, anemia due to ASA induced erosive gastritis, mild duodenitis. gastric bx benign without H.pylori.   [facility] Kitchen FOOT SURGERY  2007   Pins in toes on left foot, 5 to 6 yrs ago  . GIVENS CAPSULE STUDY N/A 03/20/2016   Procedure: GIVENS CAPSULE STUDY;  Surgeon: Danie Binder, MD;  Location: [facility];  Service: Endoscopy;  Laterality: N/A;  . MUSCLE BIOPSY Left 06/07/2018   Procedure: LEFT THIGH MUSCLE BIOPSY;  Surgeon: Erroll Luna, MD;  Location: [place];  Service: General;  Laterality: Left;  . TONSILLECTOMY      Family History  Problem Relation Age of Onset  . Heart disease Father   . Bladder Cancer Brother        in remission   . Hypertension Sister   . Dementia Sister   . Diabetes type II Sister   . Hypertension Sister   . Aneurysm Sister   . Hypertension Brother   . Hypertension Son   . Colon cancer Neg Hx     Social  history:  reports that she has never smoked. She has never used smokeless tobacco. She reports that she does not drink alcohol or use drugs.    Allergies  Allergen Reactions  . Cellcept [Mycophenolate Mofetil]     Muscle cramps, mouth soreness, difficulty swallowing, bruising  . Statins     Dx of in  . Ciprofloxacin Hcl Other (See Comments)    Chills, sick, could not tolerate it  . Hydrocodone Nausea Only  . Penicillins Rash    Medications:  Prior to Admission medications   Medication Sig Start Date End Date Taking? Authorizing Provider  Chromium-Cinnamon [phone] MCG-MG CAPS Take 2 tablets by mouth daily.   Yes [provider]  Docusate Calcium (STOOL SOFTENER PO) Take  by mouth daily as needed.   Yes [provider]  ezetimibe (ZETIA) 10 MG tablet Take 1 tablet (10 mg total) by mouth daily. 08/11/18  Yes Fayrene Helper, MD  folic acid (FOLVITE) 1 MG tablet Take 1 tablet (1 mg total) by mouth daily. 08/10/18  Yes Kathrynn Ducking, MD  gabapentin (NEURONTIN) 100 MG capsule Take 1 capsule (100 mg total) by mouth at bedtime. 08/11/18  Yes Fayrene Helper, MD  methotrexate (RHEUMATREX) 2.5 MG tablet Take 4 tablets (10 mg total) by mouth once a week. Caution:Chemotherapy. Protect from light. 10/24/18  Yes Kathrynn Ducking, MD  Misc Natural Products (OSTEO BI-FLEX JOINT SHIELD) TABS Take 1 tablet by mouth daily.   Yes [provider]  polyethylene glycol powder (MIRALAX) 17 GM/SCOOP powder 1 SCOOP IN 8 OZ LIQUID EVERY MON, WED, AND FRI. HOLD FOR DIARRHEA. 07/21/18  Yes Fields, Marga Melnick, MD  predniSONE (DELTASONE) 1 MG tablet Begin taking 4 tablets daily, taper by 1 tablet every 6 weeks until off 10/25/18  Yes Kathrynn Ducking, MD  predniSONE (DELTASONE) 5 MG tablet Take 1 tablet (5 mg total) by mouth daily with breakfast. 10/25/18  Yes Kathrynn Ducking, MD  triamterene-hydrochlorothiazide [facility]) 75-50 MG tablet TAKE 1 TABLET EVERY DAY 10/31/18  Yes Fayrene Helper, MD  UNABLE TO FIND Lift chair x 1  DX M48.06, M53.87 10/18/18  Yes Fayrene Helper, MD    ROS:  Out of a complete 14 system review of symptoms, the patient complains only of the following symptoms, and all other reviewed systems are negative.  Leg weakness Back pain, left leg pain Numbness  Blood pressure 129/79, pulse 100, temperature 98.6 F (37 C), temperature source Temporal, height 5' 5.5" (1.664 m), weight 140 lb (63.5 kg).  Physical Exam  General: The patient is alert and cooperative at the time of the examination.  Skin: No significant peripheral edema is noted.   Neurologic Exam  Mental status: The patient is alert and oriented x 3 at the time of the  examination. The patient has apparent normal recent and remote memory, with an apparently normal attention span and concentration ability.   Cranial nerves: Facial symmetry is present. Speech is normal, no aphasia or dysarthria is noted. Extraocular movements are full. Visual fields are full.  Motor: The patient has good strength in the upper extremities, with exception that the patient has difficulty elevating the right arm.  With the lower extremities, there is 3/5 strength bilaterally with hip flexion, 4/5 with knee extension bilaterally and fairly normal hamstring strength bilaterally.  Sensory examination: Soft touch sensation is symmetric on the face, arms, and legs.  Coordination: The patient has good finger-nose-finger bilaterally.  She is unable to perform heel-to-shin  on either side.  Gait and station: The patient has demonstrated a modified Gower's maneuver with standing, once standing, she is able to walk with a walker.  Tandem gait was not attempted.  Reflexes: Deep tendon reflexes are symmetric, but are depressed.   MRI lumbar 10/08/17:  IMPRESSION: Progression of multilevel degenerative change in the lumbar spine  Moderate to severe spinal stenosis L3-4 has progressed. Progression of moderate subarticular stenosis bilaterally  Severe subarticular and severe spinal stenosis L4-5 has progressed in the interval. Progression of right foraminal encroachment and compression of the right L4 nerve root.  Progressive subarticular stenosis and facet degeneration bilaterally L5-S1.   Assessment/Plan:  1.  Lumbar spinal stenosis, back pain, left leg pain  2.  Gait disorder  3.  Inflammatory myopathic disorder  4.  Peripheral neuropathy  The patient is having increased back pain and left leg pain, she will be set up for an epidural steroid injection.  The gabapentin will be increased taking 100 mg 3 times daily.  She will call for any dose adjustments.  The patient will  follow-up in 4 months.  She is continuing on a slow prednisone taper.  Jill Alexanders MD 12/22/2018 1:20 PM  [facility] [address] [place], [place] [address]  Phone [phone] Fax [phone]

## 2018-12-30 ENCOUNTER — Other Ambulatory Visit: Payer: Self-pay

## 2018-12-30 DIAGNOSIS — Z20822 Contact with and (suspected) exposure to covid-19: Secondary | ICD-10-CM

## 2018-12-30 DIAGNOSIS — Z20828 Contact with and (suspected) exposure to other viral communicable diseases: Secondary | ICD-10-CM | POA: Diagnosis not present

## 2019-01-01 LAB — NOVEL CORONAVIRUS, NAA: SARS-CoV-2, NAA: NOT DETECTED

## 2019-01-04 DIAGNOSIS — E559 Vitamin D deficiency, unspecified: Secondary | ICD-10-CM | POA: Diagnosis not present

## 2019-01-04 DIAGNOSIS — I10 Essential (primary) hypertension: Secondary | ICD-10-CM | POA: Diagnosis not present

## 2019-01-04 DIAGNOSIS — E785 Hyperlipidemia, unspecified: Secondary | ICD-10-CM | POA: Diagnosis not present

## 2019-01-04 DIAGNOSIS — R7303 Prediabetes: Secondary | ICD-10-CM | POA: Diagnosis not present

## 2019-01-05 ENCOUNTER — Ambulatory Visit
Admission: RE | Admit: 2019-01-05 | Discharge: 2019-01-05 | Disposition: A | Discharge status: Home / Self Care | Payer: Medicare HMO | Source: Ambulatory Visit | Attending: Neurology | Admitting: Neurology

## 2019-01-05 ENCOUNTER — Other Ambulatory Visit: Payer: Self-pay

## 2019-01-05 DIAGNOSIS — M48062 Spinal stenosis, lumbar region with neurogenic claudication: Secondary | ICD-10-CM

## 2019-01-05 DIAGNOSIS — M5416 Radiculopathy, lumbar region: Secondary | ICD-10-CM | POA: Diagnosis not present

## 2019-01-05 DIAGNOSIS — M48061 Spinal stenosis, lumbar region without neurogenic claudication: Secondary | ICD-10-CM | POA: Diagnosis not present

## 2019-01-05 LAB — HEMOGLOBIN A1C
Hgb A1c MFr Bld: 5.7 % of total Hgb — ABNORMAL HIGH (ref <5.7)
Mean Plasma Glucose: 117 (calc)
eAG (mmol/L): 6.5 (calc)

## 2019-01-05 LAB — COMPLETE METABOLIC PANEL WITH GFR
AG Ratio: 1.9 (calc) (ref 1.0–2.5)
ALT: 12 U/L (ref 6–29)
AST: 19 U/L (ref 10–35)
Albumin: 4 g/dL (ref 3.6–5.1)
Alkaline phosphatase (APISO): 36 U/L — ABNORMAL LOW (ref 37–153)
BUN/Creatinine Ratio: 40 (calc) — ABNORMAL HIGH (ref 6–22)
BUN: 33 mg/dL — ABNORMAL HIGH (ref 7–25)
CO2: 27 mmol/L (ref 20–32)
Calcium: 9.4 mg/dL (ref 8.6–10.4)
Chloride: 105 mmol/L (ref 98–110)
Creat: 0.82 mg/dL (ref 0.60–0.88)
GFR, Est African American: 76 mL/min/{1.73_m2} (ref >=60)
GFR, Est Non African American: 66 mL/min/{1.73_m2} (ref >=60)
Globulin: 2.1 g/dL (calc) (ref 1.9–3.7)
Glucose, Bld: 97 mg/dL (ref 65–99)
Potassium: 3.6 mmol/L (ref 3.5–5.3)
Sodium: 142 mmol/L (ref 135–146)
Total Bilirubin: 0.8 mg/dL (ref 0.2–1.2)
Total Protein: 6.1 g/dL (ref 6.1–8.1)

## 2019-01-05 LAB — LIPID PANEL
Cholesterol: 221 mg/dL — ABNORMAL HIGH (ref <200)
HDL: 99 mg/dL (ref >=50)
LDL Cholesterol (Calc): 106 mg/dL (calc) — ABNORMAL HIGH
Non-HDL Cholesterol (Calc): 122 mg/dL (calc) (ref <130)
Total CHOL/HDL Ratio: 2.2 (calc) (ref <5.0)
Triglycerides: 73 mg/dL (ref <150)

## 2019-01-05 LAB — VITAMIN D 25 HYDROXY (VIT D DEFICIENCY, FRACTURES): Vit D, 25-Hydroxy: 30 ng/mL (ref 30–100)

## 2019-01-05 LAB — TSH: TSH: 1.79 mIU/L (ref 0.40–4.50)

## 2019-01-05 MED ORDER — METHYLPREDNISOLONE ACETATE 40 MG/ML INJ SUSP (RADIOLOG
120.0000 mg | Freq: Once | INTRAMUSCULAR | Status: AC
  Administered 2019-01-05: 14:00:00 120 mg via EPIDURAL

## 2019-01-05 MED ORDER — IOPAMIDOL (ISOVUE-M 200) INJECTION 41%
1.0000 mL | Freq: Once | INTRAMUSCULAR | Status: AC
  Administered 2019-01-05: 14:00:00 1 mL via EPIDURAL

## 2019-01-05 NOTE — Discharge Instructions (Signed)

## 2019-01-09 ENCOUNTER — Ambulatory Visit (INDEPENDENT_AMBULATORY_CARE_PROVIDER_SITE_OTHER): Payer: Medicare HMO | Admitting: Family Medicine

## 2019-01-09 ENCOUNTER — Other Ambulatory Visit: Payer: Self-pay

## 2019-01-09 ENCOUNTER — Encounter: Payer: Self-pay | Admitting: Family Medicine

## 2019-01-09 VITALS — BP 120/80 | HR 73 | Temp 96.3°F | Resp 15 | Ht 66.0 in | Wt 138.0 lb

## 2019-01-09 DIAGNOSIS — M542 Cervicalgia: Secondary | ICD-10-CM | POA: Diagnosis not present

## 2019-01-09 DIAGNOSIS — G7249 Other inflammatory and immune myopathies, not elsewhere classified: Secondary | ICD-10-CM

## 2019-01-09 DIAGNOSIS — I10 Essential (primary) hypertension: Secondary | ICD-10-CM

## 2019-01-09 DIAGNOSIS — M5387 Other specified dorsopathies, lumbosacral region: Secondary | ICD-10-CM | POA: Diagnosis not present

## 2019-01-09 DIAGNOSIS — Z23 Encounter for immunization: Secondary | ICD-10-CM

## 2019-01-09 DIAGNOSIS — Z1231 Encounter for screening mammogram for malignant neoplasm of breast: Secondary | ICD-10-CM | POA: Diagnosis not present

## 2019-01-09 DIAGNOSIS — E785 Hyperlipidemia, unspecified: Secondary | ICD-10-CM

## 2019-01-09 NOTE — Patient Instructions (Addendum)
Annual Physical exam with MD end Feb , 2021,  Call if you need me before  Please sched mammogram for  Novemebr or after, and an appt around 1 pm, needs to arrange a ride  Flu vaccine today  Labs are excellent  Please get X ray of neck in the next 1 week, I believe the problem is there  Causing arm pain, start gabapentin 100 mg at bedtime to see if this helps  Continue to work [name] [name] getting your lift chair

## 2019-01-09 NOTE — Progress Notes (Signed)
   [name] [name]     MRN: [id number]      DOB: 07/08/1932   HPI [name] is here for follow up and re-evaluation of chronic medical conditions, medication management and review of any available recent lab and radiology data.  Preventive health is updated, specifically  Cancer screening and Immunization.   Questions or concerns regarding consultations or procedures which the PT has had in the interim are  addressed. The PT denies any adverse reactions to current medications since the last visit.  Right arm pain and weakness noted in past 8 months, getting worse, aggravated by rising up from a sitting position, last night it kept her awake Some relief with lumbar epidural States lift chair still has not been delivered, needs to do copay  Of $688 up front and see if [employer] will take care of it, is following through on this ROS Denies recent fever or chills. Denies sinus pressure, nasal congestion, ear pain or sore throat. Denies chest congestion, productive cough or wheezing. Denies chest pains, palpitations and leg swelling Denies abdominal pain, nausea, vomiting,diarrhea or constipation.   Denies dysuria, frequency, hesitancy or incontinence. . Denies headaches, seizures, numbness, or tingling. Denies depression, anxiety or insomnia. Denies skin break down or rash.   PE  BP 120/80   Pulse 73   Temp (!) 96.3 F (35.7 C) (Temporal)   Resp 15   Ht 5' 6"  (1.676 m)   Wt 138 lb (62.6 kg)   SpO2 100%   BMI 22.27 kg/m   Patient alert and oriented and in no cardiopulmonary distress.  HEENT: No facial asymmetry, EOMI,     Neck supple .  Chest: Clear to auscultation bilaterally.  CVS: S1, S2 no murmurs, no S3.Regular rate.  ABD: Soft non tender.   Ext: No edema  MS: decreased  ROM spine, shoulders, hips and knees.  Skin: Intact, no ulcerations or rash noted.  Psych: Good eye contact, normal affect. Memory intact not anxious or depressed appearing.  CNS: CN 2-12 intact, power,   normal throughout.no focal deficits noted.   Assessment & Plan  Neck pain, bilateral posterior Upper arm pain right worse than left , constant, likely originating from C spine disease, needs DG C spine . Trial of bedtime gabapentin  Hyperlipidemia LDL goal <100 Hyperlipidemia:Low fat diet discussed and encouraged.   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

## 2019-01-13 DIAGNOSIS — M542 Cervicalgia: Secondary | ICD-10-CM | POA: Insufficient documentation

## 2019-01-13 NOTE — Assessment & Plan Note (Signed)
Positive response to treatment through neurology, encouraged to continue same

## 2019-01-13 NOTE — Assessment & Plan Note (Signed)
Controlled, no change in medication  

## 2019-01-13 NOTE — Assessment & Plan Note (Signed)
Good response top epidural, continue management through pain clinic

## 2019-01-13 NOTE — Assessment & Plan Note (Signed)
Hyperlipidemia:Low fat diet discussed and encouraged.   Lipid Panel  Lab Results  Component Value Date   CHOL 221 (H) 01/04/2019   HDL 99 01/04/2019   LDLCALC 106 (H) 01/04/2019   TRIG 73 01/04/2019   CHOLHDL 2.2 01/04/2019   Improved , continue dietary management only

## 2019-01-13 NOTE — Assessment & Plan Note (Signed)
Upper arm pain right worse than left , constant, likely originating from C spine disease, needs DG C spine . Trial of bedtime gabapentin

## 2019-01-17 ENCOUNTER — Ambulatory Visit: Payer: Medicare HMO

## 2019-01-18 ENCOUNTER — Encounter: Payer: Self-pay | Admitting: Family Medicine

## 2019-01-18 ENCOUNTER — Other Ambulatory Visit: Payer: Self-pay

## 2019-01-18 ENCOUNTER — Ambulatory Visit (INDEPENDENT_AMBULATORY_CARE_PROVIDER_SITE_OTHER): Payer: Medicare HMO | Admitting: Family Medicine

## 2019-01-18 VITALS — BP 120/80 | HR 73 | Resp 15 | Ht 66.0 in | Wt 138.0 lb

## 2019-01-18 DIAGNOSIS — Z0000 Encounter for general adult medical examination without abnormal findings: Secondary | ICD-10-CM

## 2019-01-18 NOTE — Patient Instructions (Addendum)
[name] [name] , Thank you for taking time to come for your Medicare Wellness Visit. I appreciate your ongoing commitment to your health goals. Please review the following plan we discussed and let me know if I can assist you in the future.   Please continue to practice social distancing to keep you, your family, and our community safe.  If you must go out, please wear a Mask and practice good handwashing.  Screening recommendations/referrals: Colonoscopy: no longer needed Mammogram: Up-to-date Bone Density: Up-to-date, please make sure that you are getting enough calcium and vitamin D for bones healthy as you are osteopenic Recommended yearly ophthalmology/optometry visit for glaucoma screening and checkup Recommended yearly dental visit for hygiene and checkup  Vaccinations: Influenza vaccine: Completed due again fall 2021 Pneumococcal vaccine: Completed  Tdap vaccine: Due 2021 Shingles vaccine: Completed  Advanced directives: Completed  Conditions/risks identified: Falls  Next appointment: 05/15/2019   Preventive Care 65 Years and Older, Female Preventive care refers to lifestyle choices and visits with your health care provider that can promote health and wellness. What does preventive care include?  A yearly physical exam. This is also called an annual well check.  Dental exams once or twice a year.  Routine eye exams. Ask your health care provider how often you should have your eyes checked.  Personal lifestyle choices, including:  Daily care of your teeth and gums.  Regular physical activity.  Eating a healthy diet.  Avoiding tobacco and drug use.  Limiting alcohol use.  Practicing safe sex.  Taking low-dose aspirin every day.  Taking vitamin and mineral supplements as recommended by your health care provider. What happens during an annual well check? The services and screenings done by your health care provider during your annual well check will depend on your  age, overall health, lifestyle risk factors, and family history of disease. Counseling  Your health care provider may ask you questions about your:  Alcohol use.  Tobacco use.  Drug use.  Emotional well-being.  Home and relationship well-being.  Sexual activity.  Eating habits.  History of falls.  Memory and ability to understand (cognition).  Work and work Statistician.  Reproductive health. Screening  You may have the following tests or measurements:  Height, weight, and BMI.  Blood pressure.  Lipid and cholesterol levels. These may be checked every 5 years, or more frequently if you are over 29 years old.  Skin check.  Lung cancer screening. You may have this screening every year starting at age 29 if you have a 30-pack-year history of smoking and currently smoke or have quit within the past 15 years.  Fecal occult blood test (FOBT) of the stool. You may have this test every year starting at age 31.  Flexible sigmoidoscopy or colonoscopy. You may have a sigmoidoscopy every 5 years or a colonoscopy every 10 years starting at age 77.  Hepatitis C blood test.  Hepatitis B blood test.  Sexually transmitted disease (STD) testing.  Diabetes screening. This is done by checking your blood sugar (glucose) after you have not eaten for a while (fasting). You may have this done every 1-3 years.  Bone density scan. This is done to screen for osteoporosis. You may have this done starting at age 27.  Mammogram. This may be done every 1-2 years. Talk to your health care provider about how often you should have regular mammograms. Talk with your health care provider about your test results, treatment options, and if necessary, the need for more tests.  Vaccines  Your health care provider may recommend certain vaccines, such as:  Influenza vaccine. This is recommended every year.  Tetanus, diphtheria, and acellular pertussis (Tdap, Td) vaccine. You may need a Td booster  every 10 years.  Zoster vaccine. You may need this after age 72.  Pneumococcal 13-valent conjugate (PCV13) vaccine. One dose is recommended after age 62.  Pneumococcal polysaccharide (PPSV23) vaccine. One dose is recommended after age 42. Talk to your health care provider about which screenings and vaccines you need and how often you need them. This information is not intended to replace advice given to you by your health care provider. Make sure you discuss any questions you have with your health care provider. Document Released: 04/05/2015 Document Revised: 11/27/2015 Document Reviewed: 01/08/2015 Elsevier Interactive Patient Education  2017 [employer] Prevention in the Home Falls can cause injuries. They can happen to people of all ages. There are many things you can do to make your home safe and to help prevent falls. What can I do on the outside of my home?  Regularly fix the edges of walkways and driveways and fix any cracks.  Remove anything that might make you trip as you walk through a door, such as a raised step or threshold.  Trim any bushes or trees on the path to your home.  Use bright outdoor lighting.  Clear any walking paths of anything that might make someone trip, such as rocks or tools.  Regularly check to see if handrails are loose or broken. Make sure that both sides of any steps have handrails.  Any raised decks and porches should have guardrails on the edges.  Have any leaves, snow, or ice cleared regularly.  Use sand or salt on walking paths during winter.  Clean up any spills in your garage right away. This includes oil or grease spills. What can I do in the bathroom?  Use night lights.  Install grab bars by the toilet and in the tub and shower. Do not use towel bars as grab bars.  Use non-skid mats or decals in the tub or shower.  If you need to sit down in the shower, use a plastic, non-slip stool.  Keep the floor dry. Clean up any  water that spills on the floor as soon as it happens.  Remove soap buildup in the tub or shower regularly.  Attach bath mats securely with double-sided non-slip rug tape.  Do not have throw rugs and other things on the floor that can make you trip. What can I do in the bedroom?  Use night lights.  Make sure that you have a light by your bed that is easy to reach.  Do not use any sheets or blankets that are too big for your bed. They should not hang down onto the floor.  Have a firm chair that has side arms. You can use this for support while you get dressed.  Do not have throw rugs and other things on the floor that can make you trip. What can I do in the kitchen?  Clean up any spills right away.  Avoid walking on wet floors.  Keep items that you use a lot in easy-to-reach places.  If you need to reach something above you, use a strong step stool that has a grab bar.  Keep electrical cords out of the way.  Do not use floor polish or wax that makes floors slippery. If you must use wax, use non-skid floor wax.  Do not have throw rugs and other things on the floor that can make you trip. What can I do with my stairs?  Do not leave any items on the stairs.  Make sure that there are handrails on both sides of the stairs and use them. Fix handrails that are broken or loose. Make sure that handrails are as long as the stairways.  Check any carpeting to make sure that it is firmly attached to the stairs. Fix any carpet that is loose or worn.  Avoid having throw rugs at the top or bottom of the stairs. If you do have throw rugs, attach them to the floor with carpet tape.  Make sure that you have a light switch at the top of the stairs and the bottom of the stairs. If you do not have them, ask someone to add them for you. What else can I do to help prevent falls?  Wear shoes that:  Do not have high heels.  Have rubber bottoms.  Are comfortable and fit you well.  Are closed  at the toe. Do not wear sandals.  If you use a stepladder:  Make sure that it is fully opened. Do not climb a closed stepladder.  Make sure that both sides of the stepladder are locked into place.  Ask someone to hold it for you, if possible.  Clearly mark and make sure that you can see:  Any grab bars or handrails.  First and last steps.  Where the edge of each step is.  Use tools that help you move around (mobility aids) if they are needed. These include:  Canes.  Walkers.  Scooters.  Crutches.  Turn on the lights when you go into a dark area. Replace any light bulbs as soon as they burn out.  Set up your furniture so you have a clear path. Avoid moving your furniture around.  If any of your floors are uneven, fix them.  If there are any pets around you, be aware of where they are.  Review your medicines with your doctor. Some medicines can make you feel dizzy. This can increase your chance of falling. Ask your doctor what other things that you can do to help prevent falls. This information is not intended to replace advice given to you by your health care provider. Make sure you discuss any questions you have with your health care provider. Document Released: 01/03/2009 Document Revised: 08/15/2015 Document Reviewed: 04/13/2014 Elsevier Interactive Patient Education  2017 [employer].

## 2019-01-18 NOTE — Progress Notes (Signed)
Subjective:   [name] [name] is a 85 y.o. female who presents for Medicare Annual (Subsequent) preventive examination.  Location of Patient: Home Location of Provider: Telehealth Consent was obtain for visit to be over via telehealth.  I verified that I am speaking with the correct person using two identifiers.    Review of Systems:    Cardiac Risk Factors include: advanced age (>80mn, >>68women);dyslipidemia;hypertension;sedentary lifestyle     Objective:     Vitals: BP 120/80   Pulse 73   Resp 15   Ht 5' 6"  (1.676 m)   Wt 138 lb (62.6 kg)   BMI 22.27 kg/m   Body mass index is 22.27 kg/m.  Advanced Directives 06/07/2018 05/26/2018 01/12/2018 09/28/2016 03/21/2016 03/20/2016 01/15/2016  Does Patient Have a Medical Advance Directive? Yes Yes Yes Yes No Yes Yes  Type of AParamedicof ATuxedo ParkLiving will HL[place]Living will Living will HBucks[place]Living will Healthcare Power of AGrand TraverseLiving will H[place]Living will  Does patient want to make changes to medical advance directive? No - Patient declined - No - Patient declined No - Patient declined - - No - Patient declined  Copy of [place] Chart? No - copy requested No - copy requested - No - copy requested - - No - copy requested    Tobacco Social History   Tobacco Use  Smoking Status Never Smoker  Smokeless Tobacco Never Used  Tobacco Comment   Never smoked     Counseling given: Yes Comment: Never smoked   Clinical Intake:  Pre-visit preparation completed: Yes  Pain : 0-10 Pain Score: 5  Pain Location: Leg Pain Orientation: Right, Left Pain Descriptors / Indicators: Sharp Pain Onset: More than a month ago Pain Frequency: Constant Pain Relieving Factors: pain rubs sometimes Effect of Pain on Daily Activities: yes, walks with a walker  Pain Relieving Factors: pain rubs  sometimes  BMI - recorded: 22.27 Nutritional Status: BMI of 19-24  Normal Nutritional Risks: None Diabetes: No  How often do you need to have someone help you when you read instructions, pamphlets, or other written materials from your doctor or pharmacy?: 1 - Never What is the last grade level you completed in school?: 12  Interpreter Needed?: No     Past Medical History:  Diagnosis Date  . Arthritis    spinal stenosis  . Elevated LFTs    secondary to fatty liver, negative work-up in 2011  . Hypercholesterolemia   . Hypertension   . Lumbar spinal stenosis 01/14/2018   L3-4 and L4-5  . Myopathy 02/15/2018  . Osteoarthritis    right knee  . Peripheral neuropathy 07/06/2018  . PONV (postoperative nausea and vomiting)   . [place]terine cancer ([place] 08/2006   grade 1, no recurrence up to 2013   Past Surgical History:  Procedure Laterality Date  . 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 APPENDECTOMY  1973  . BREAST BIOPSY Left 2018   Benign  . CHOLECYSTECTOMY  1973  . COLONOSCOPY    06/21/2007   ROBS:JGGEZMrectum/Sigmoid diverticula, diminutive hepatic flexure polyp s/p bx. Benign.   . COLONOSCOPY N/A 10/17/2012   Dr. FOneida Alar moderate diverticulosis was noted in the sigmoid colon, moderate sized internal hemorrhoids. next tcs in10 years  . ESOPHAGOGASTRODUODENOSCOPY N/A 03/20/2016   Dr. FOneida Alar widely patent Schatzki ring, anemia due to ASA induced erosive gastritis, mild duodenitis. gastric bx benign  without H.pylori.   Marland Kitchen FOOT SURGERY  2007   Pins in toes on left foot, 5 to 6 yrs ago  . GIVENS CAPSULE STUDY N/A 03/20/2016   Procedure: GIVENS CAPSULE STUDY;  Surgeon: Danie Binder, MD;  [facility];  Service: Endoscopy;  Laterality: N/A;  . MUSCLE BIOPSY Left 06/07/2018   Procedure: LEFT THIGH MUSCLE BIOPSY;  Surgeon: Erroll Luna, MD;  Location: [place];  Service: General;  Laterality: Left;  . TONSILLECTOMY     Family History   Problem Relation Age of Onset  . Heart disease Father   . Bladder Cancer Brother        in remission   . Hypertension Sister   . Dementia Sister   . Diabetes type II Sister   . Hypertension Sister   . Aneurysm Sister   . Hypertension Brother   . Hypertension Son   . Colon cancer Neg Hx    Social History   Socioeconomic History  . Marital status: Married    Spouse name: Jeneen Rinks   . Number of children: 3  . Years of education: 50  . Highest education level: 12th grade  Occupational History  . Occupation: retired   Scientific laboratory technician  . Financial resource strain: Not hard at all  . Food insecurity    Worry: Never true    Inability: Never true  . Transportation needs    Medical: No    Non-medical: No  Tobacco Use  . Smoking status: Never Smoker  . Smokeless tobacco: Never Used  . Tobacco comment: Never smoked  Substance and Sexual Activity  . Alcohol use: No  . Drug use: No  . Sexual activity: Not Currently    Birth control/protection: Post-menopausal, Surgical  Lifestyle  . Physical activity    Days per week: 0 days    Minutes per session: 0 min  . Stress: To some extent  Relationships  . Social connections    Talks on phone: More than three times a week    Gets together: Once a week    Attends religious service: 1 to 4 times per year    Active member of club or organization: Yes    Attends meetings of clubs or organizations: Never    Relationship status: Married  Other Topics Concern  . Not on file  Social History Narrative   Lives with husband, Jeneen Rinks    Caffeine use: soda daily   Right handed     Outpatient Encounter Medications as of 01/18/2019  Medication Sig  . Chromium-Cinnamon [phone] MCG-MG CAPS Take 2 tablets by mouth daily.  Mariane Baumgarten Calcium (STOOL SOFTENER PO) Take by mouth daily as needed.  . ezetimibe (ZETIA) 10 MG tablet Take 1 tablet (10 mg total) by mouth daily.  . folic acid (FOLVITE) 1 MG tablet Take 1 tablet (1 mg total) by mouth daily.   Marland Kitchen gabapentin (NEURONTIN) 100 MG capsule Take 1 capsule (100 mg total) by mouth at bedtime.  . methotrexate (RHEUMATREX) 2.5 MG tablet Take 4 tablets (10 mg total) by mouth once a week. Caution:Chemotherapy. Protect from light.  . Misc Natural Products (OSTEO BI-FLEX JOINT SHIELD) TABS Take 1 tablet by mouth daily.  . polyethylene glycol powder (MIRALAX) 17 GM/SCOOP powder 1 SCOOP IN 8 OZ LIQUID EVERY MON, WED, AND FRI. HOLD FOR DIARRHEA.  Marland Kitchen predniSONE (DELTASONE) 1 MG tablet Begin taking 4 tablets daily, taper by 1 tablet every 6 weeks until off  . predniSONE (DELTASONE) 5 MG  tablet Take 1 tablet (5 mg total) by mouth daily with breakfast.  . triamterene-hydrochlorothiazide (MAXZIDE) 75-50 MG tablet TAKE 1 TABLET EVERY DAY  . UNABLE TO FIND Lift chair x 1  DX M48.06, M53.87   No facility-administered encounter medications on file as of 01/18/2019.     Activities of Daily Living In your present state of health, do you have any difficulty performing the following activities: 01/18/2019 06/07/2018  Hearing? N N  Vision? N N  Difficulty concentrating or making decisions? Y N  Comment sometimes -  Walking or climbing stairs? Y Y  Dressing or bathing? Y N  Doing errands, shopping? Y -  Conservation officer, nature and eating ? Y -  Using the Toilet? N -  In the past six months, have you accidently leaked urine? N -  Do you have problems with loss of bowel control? N -  Managing your Medications? N -  Managing your Finances? N -  Housekeeping or managing your Housekeeping? N -  Some recent data might be hidden    Patient Care Team: Fayrene Helper, MD as PCP - General Carole Civil, MD as Consulting Physician (Orthopedic Surgery) Irine Seal, MD as Attending Physician (Urology) Danie Binder, MD as Consulting Physician (Gastroenterology) Jovita Gamma, MD as Consulting Physician (Neurosurgery)    Assessment:   This is a routine wellness examination for Mimi.  Exercise Activities  and Dietary recommendations Current Exercise Habits: The patient does not participate in regular exercise at present, Exercise limited by: orthopedic condition(s)  Goals    . DIET - INCREASE WATER INTAKE    . Exercise 3x per week (30 min per time)     Recommend starting a routine exercise program at least 3 days a week for 30-45 minutes at a time as tolerated.         Fall Risk Fall Risk  01/18/2019 01/18/2019 01/09/2019 05/03/2018 01/14/2018  Falls in the past year? 1 1 1 1  Yes  Number falls in past yr: 0 0 1 1 1   Injury with Fall? 0 0 0 1 No  Comment - - - - -  Risk for fall due to : History of fall(s);Impaired balance/gait History of fall(s);Impaired balance/gait - Impaired balance/gait;Orthopedic patient;Impaired mobility;History of fall(s) -  Follow up - Falls evaluation completed;Education provided - - -    Is the patient's home free of loose throw rugs in walkways, pet beds, electrical cords, etc?   yes      Grab bars in the bathroom? yes      Handrails on the stairs?   yes      Adequate lighting?   yes      Depression Screen PHQ 2/9 Scores 01/18/2019 08/11/2018 05/03/2018 01/12/2018  PHQ - 2 Score 1 1 2 2   PHQ- 9 Score - - 7 5     Cognitive Function     6CIT Screen 01/18/2019 01/12/2018 09/28/2016  What Year? 0 points 0 points 0 points  What month? 0 points 0 points 0 points  What time? 0 points 0 points 0 points  Count back from 20 0 points 0 points 0 points  Months in reverse 2 points 0 points 0 points  Repeat phrase 2 points 0 points 0 points  Total Score 4 0 0    Immunization History  Administered Date(s) Administered  . Fluad Quad(high Dose 65+) 01/09/2019  . Influenza Split 12/22/2012, 01/04/2014, 12/13/2014  . Influenza Whole 01/21/2007, 05/10/2008, 12/11/2008, 12/06/2009  . Influenza, High Dose Seasonal  PF 12/30/2017  . Influenza,inj,Quad PF,6+ Mos 04/22/2017  . Influenza-Unspecified 11/21/2013, 12/20/2015  . Pneumococcal Conjugate-13 11/02/2013  .  Pneumococcal Polysaccharide-23 08/15/2010  . Td 08/31/2007  . Zoster 08/31/2006    Qualifies for Shingles Vaccine? Completed  Screening Tests Health Maintenance  Topic Date Due  . TETANUS/TDAP  01/09/2020 (Originally 08/30/2017)  . INFLUENZA VACCINE  Completed  . DEXA SCAN  Completed  . PNA vac Low Risk Adult  Completed    Cancer Screenings: Lung: Low Dose CT Chest recommended if Age 27-80 years, 30 pack-year currently smoking OR have quit w/in 15years. Patient does not qualify. Breast:  Up to date on Mammogram?  Up-to-date Up to date of Bone Density/Dexa? Up-to-date Colorectal:  No longer needed  Additional Screenings:   Hepatitis C Screening:      Plan:       1. Encounter for Medicare annual wellness exam  I have personally reviewed and noted the following in the patient's chart:   . Medical and social history . Use of alcohol, tobacco or illicit drugs  . Current medications and supplements . Functional ability and status . Nutritional status . Physical activity . Advanced directives . List of other physicians . Hospitalizations, surgeries, and ER visits in previous 12 months . Vitals . Screenings to include cognitive, depression, and falls . Referrals and appointments  In addition, I have reviewed and discussed with patient certain preventive protocols, quality metrics, and best practice recommendations. A written personalized care plan for preventive services as well as general preventive health recommendations were provided to patient.    I provided 20 minutes of non-face-to-face time during this encounter.   Perlie Mayo, NP  01/18/2019

## 2019-01-23 ENCOUNTER — Encounter (HOSPITAL_COMMUNITY): Payer: Self-pay

## 2019-01-23 ENCOUNTER — Telehealth: Payer: Self-pay

## 2019-01-23 ENCOUNTER — Other Ambulatory Visit: Payer: Self-pay

## 2019-01-23 ENCOUNTER — Ambulatory Visit (HOSPITAL_COMMUNITY)
Admission: RE | Admit: 2019-01-23 | Discharge: 2019-01-23 | Disposition: A | Discharge status: Home / Self Care | Payer: Medicare HMO | Source: Ambulatory Visit | Attending: Family Medicine | Admitting: Family Medicine

## 2019-01-23 ENCOUNTER — Ambulatory Visit: Payer: Medicare HMO

## 2019-01-23 DIAGNOSIS — M542 Cervicalgia: Secondary | ICD-10-CM

## 2019-01-23 DIAGNOSIS — M47812 Spondylosis without myelopathy or radiculopathy, cervical region: Secondary | ICD-10-CM | POA: Diagnosis not present

## 2019-01-23 NOTE — Telephone Encounter (Signed)
Neck xray entered

## 2019-02-04 ENCOUNTER — Other Ambulatory Visit: Payer: Self-pay

## 2019-02-04 ENCOUNTER — Emergency Department (HOSPITAL_COMMUNITY)
Admission: EM | Admit: 2019-02-04 | Discharge: 2019-02-05 | Disposition: A | Discharge status: Home / Self Care | Payer: Medicare HMO | Attending: Emergency Medicine | Admitting: Emergency Medicine

## 2019-02-04 DIAGNOSIS — R2242 Localized swelling, mass and lump, left lower limb: Secondary | ICD-10-CM | POA: Diagnosis present

## 2019-02-04 DIAGNOSIS — R609 Edema, unspecified: Secondary | ICD-10-CM | POA: Diagnosis not present

## 2019-02-04 DIAGNOSIS — I10 Essential (primary) hypertension: Secondary | ICD-10-CM | POA: Diagnosis not present

## 2019-02-04 DIAGNOSIS — Z79899 Other long term (current) drug therapy: Secondary | ICD-10-CM | POA: Diagnosis not present

## 2019-02-04 DIAGNOSIS — R600 Localized edema: Secondary | ICD-10-CM | POA: Diagnosis not present

## 2019-02-04 NOTE — ED Triage Notes (Signed)
Pt c/o bilateral leg edema x 2 days, no other symptoms

## 2019-02-04 NOTE — ED Notes (Signed)
Pt with bilateral leg swelling that has gotten worse over the past 2 days, pt states family insisted she come here for evaluation.  Pt denies any SOB or CP.

## 2019-02-05 ENCOUNTER — Ambulatory Visit (HOSPITAL_COMMUNITY)
Admission: RE | Admit: 2019-02-05 | Discharge: 2019-02-05 | Disposition: A | Discharge status: Home / Self Care | Payer: Medicare HMO | Source: Ambulatory Visit | Attending: Emergency Medicine | Admitting: Emergency Medicine

## 2019-02-05 DIAGNOSIS — I82412 Acute embolism and thrombosis of left femoral vein: Secondary | ICD-10-CM | POA: Diagnosis not present

## 2019-02-05 DIAGNOSIS — I82432 Acute embolism and thrombosis of left popliteal vein: Secondary | ICD-10-CM | POA: Diagnosis not present

## 2019-02-05 LAB — CBC WITH DIFFERENTIAL/PLATELET
Abs Immature Granulocytes: 0.05 10*3/uL (ref 0.00–0.07)
Basophils Absolute: 0 10*3/uL (ref 0.0–0.1)
Basophils Relative: 0 %
Eosinophils Absolute: 0 10*3/uL (ref 0.0–0.5)
Eosinophils Relative: 1 %
HCT: 34.2 % — ABNORMAL LOW (ref 36.0–46.0)
Hemoglobin: 10.5 g/dL — ABNORMAL LOW (ref 12.0–15.0)
Immature Granulocytes: 1 %
Lymphocytes Relative: 10 %
Lymphs Abs: 0.6 10*3/uL — ABNORMAL LOW (ref 0.7–4.0)
MCH: 31 pg (ref 26.0–34.0)
MCHC: 30.7 g/dL (ref 30.0–36.0)
MCV: 100.9 fL — ABNORMAL HIGH (ref 80.0–100.0)
Monocytes Absolute: 0.7 10*3/uL (ref 0.1–1.0)
Monocytes Relative: 11 %
Neutro Abs: 4.9 10*3/uL (ref 1.7–7.7)
Neutrophils Relative %: 77 %
Platelets: 185 10*3/uL (ref 150–400)
RBC: 3.39 MIL/uL — ABNORMAL LOW (ref 3.87–5.11)
RDW: 13.4 % (ref 11.5–15.5)
WBC: 6.4 10*3/uL (ref 4.0–10.5)
nRBC: 0 % (ref 0.0–0.2)

## 2019-02-05 LAB — COMPREHENSIVE METABOLIC PANEL
ALT: 22 U/L (ref 0–44)
AST: 26 U/L (ref 15–41)
Albumin: 3.7 g/dL (ref 3.5–5.0)
Alkaline Phosphatase: 42 U/L (ref 38–126)
Anion gap: 8 (ref 5–15)
BUN: 35 mg/dL — ABNORMAL HIGH (ref 8–23)
CO2: 26 mmol/L (ref 22–32)
Calcium: 9.2 mg/dL (ref 8.9–10.3)
Chloride: 108 mmol/L (ref 98–111)
Creatinine, Ser: 0.94 mg/dL (ref 0.44–1.00)
GFR calc Af Amer: >60 mL/min (ref >60)
GFR calc non Af Amer: 56 mL/min — ABNORMAL LOW (ref >60)
Glucose, Bld: 115 mg/dL — ABNORMAL HIGH (ref 70–99)
Potassium: 4.2 mmol/L (ref 3.5–5.1)
Sodium: 142 mmol/L (ref 135–145)
Total Bilirubin: 0.5 mg/dL (ref 0.3–1.2)
Total Protein: 6.2 g/dL — ABNORMAL LOW (ref 6.5–8.1)

## 2019-02-05 LAB — BRAIN NATRIURETIC PEPTIDE: B Natriuretic Peptide: 62 pg/mL (ref 0.0–100.0)

## 2019-02-05 LAB — D-DIMER, QUANTITATIVE: D-Dimer, Quant: 19.1 ug/mL-FEU — ABNORMAL HIGH (ref 0.00–0.50)

## 2019-02-05 MED ORDER — FUROSEMIDE 20 MG PO TABS: 40.0000 mg | ORAL_TABLET | Freq: Every day | ORAL | 0 refills | Status: DC | PRN

## 2019-02-05 MED ORDER — ENOXAPARIN SODIUM 80 MG/0.8ML ~~LOC~~ SOLN
1.0000 mg/kg | Freq: Once | SUBCUTANEOUS | Status: AC
  Administered 2019-02-05: 65 mg via SUBCUTANEOUS
  Filled 2019-02-05: qty 0.8

## 2019-02-05 NOTE — ED Provider Notes (Signed)
Ultrasound of bilateral lower extremities reviewed with patient and her son.  DVT noted in left lower extremity.  Discussed scenario with pharmacist.  Will Rx Eliquis 10 mg twice daily for 1 week; then 5 mg twice daily until f/u by primary care.  She will follow-up with Dr. Moshe Cipro.   Nat Christen, MD 02/05/19 1323

## 2019-02-05 NOTE — ED Provider Notes (Signed)
[facility] EMERGENCY DEPARTMENT Provider Note   CSN: [id number] Arrival date & time: 02/04/19  2128   Time seen 11:29 PM  History   Chief Complaint Chief Complaint  Patient presents with  . Leg Swelling    HPI [name] [name] is a 85 y.o. female.     HPI patient states she has been having swelling of her lower extremities for at least a year or more.  She states normally in the morning they are not swollen and as the day goes on to get swollen.  Patient states she has been told it is because she has arthritis.  She states she needs a total knee replacement on the right.  She states however the last 2 days they have been swollen when she got up in the morning.  She denies any change in her activity and states she gets up every day and she uses a walker to ambulate due to her underlying arthritis.  She does states she fell about a week ago, she was going down one step and she fell backwards onto her left side and hit her left knee.  She denies loss of consciousness.  She has a history of peripheral neuropathy but denies any numbness in her extremities other than the past 2 nights her feet have felt numb.  She denies chest pain, shortness of breath, or fever.  She has been on prednisone for the past 16 weeks.  She was on 10 mg a day for 6 weeks, then 9 mg a day for 6 weeks etc.  She is currently on 7 mg a day.  She states she had blood work done 3 weeks ago and her doctor told her everything looked fine.  Patient is in no acute distress.  She states she is on the prednisone because she had a muscle biopsy done in March and her muscle enzymes were elevated.  She is followed by a neurologist and a neurosurgeon.  PCP Fayrene Helper, MD Ortho [name]   Past Medical History:  Diagnosis Date  . Arthritis    spinal stenosis  . Elevated LFTs    secondary to fatty liver, negative work-up in 2011  . Hypercholesterolemia   . Hypertension   . Lumbar spinal stenosis 01/14/2018   L3-4 and  L4-5  . Myopathy 02/15/2018  . Osteoarthritis    right knee  . Peripheral neuropathy 07/06/2018  . PONV (postoperative nausea and vomiting)   . Uterine cancer ([place]) 08/2006   grade 1, no recurrence up to 2013    Patient Active Problem List   Diagnosis Date Noted  . Neck pain, bilateral posterior 01/13/2019  . Statins contraindicated 08/14/2018  . Educated about COVID-19 virus infection 08/14/2018  . Peripheral neuropathy 07/06/2018  . Dysphagia 05/03/2018  . 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 Lumbar spinal stenosis 01/14/2018  . Abnormal weight loss 07/29/2017  . GERD (gastroesophageal reflux disease) 06/17/2016  . Anemia   . 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 Constipation 10/04/2012  . Urinary incontinence 06/20/2012  . Vitamin D deficiency 08/22/2011  . Prediabetes 05/29/2011  . NASH (nonalcoholic steatohepatitis) 11/05/2009  . Arthritis of right knee 09/04/2009  . Hyperlipidemia LDL goal <100 09/08/2007  . Essential hypertension 09/08/2007    Past Surgical History:  Procedure Laterality Date  . 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 APPENDECTOMY  1973  . BREAST BIOPSY Left 2018   Benign  . CHOLECYSTECTOMY  1973  . COLONOSCOPY    06/21/2007   UYQ:IHKVQQ rectum/Sigmoid diverticula, diminutive hepatic flexure polyp s/p bx. Benign.   . COLONOSCOPY N/A 10/17/2012   Dr. Oneida Alar- moderate diverticulosis was noted in the sigmoid colon, moderate sized internal hemorrhoids. next tcs in10 years  . ESOPHAGOGASTRODUODENOSCOPY N/A 03/20/2016   Dr. Oneida Alar, widely patent Schatzki ring, anemia due to ASA induced erosive gastritis, mild duodenitis. gastric bx benign without H.pylori.   Marland Kitchen FOOT SURGERY  2007   Pins in toes on left foot, 5 to 6 yrs ago  . GIVENS CAPSULE STUDY N/A 03/20/2016   Procedure: GIVENS CAPSULE STUDY;  Surgeon: Danie Binder, MD;  [facility];  Service: Endoscopy;  Laterality: N/A;  .  MUSCLE BIOPSY Left 06/07/2018   Procedure: LEFT THIGH MUSCLE BIOPSY;  Surgeon: Erroll Luna, MD;  Location: [place];  Service: General;  Laterality: Left;  . TONSILLECTOMY       OB History   No obstetric history on file.      Home Medications    Prior to Admission medications   Medication Sig Start Date End Date Taking? Authorizing Provider  Chromium-Cinnamon [phone] MCG-MG CAPS Take 2 tablets by mouth daily.   Yes [provider]  Docusate Calcium (STOOL SOFTENER PO) Take by mouth daily as needed.   Yes [provider]  ezetimibe (ZETIA) 10 MG tablet Take 1 tablet (10 mg total) by mouth daily. 08/11/18  Yes Fayrene Helper, MD  folic acid (FOLVITE) 1 MG tablet Take 1 tablet (1 mg total) by mouth daily. 08/10/18  Yes Kathrynn Ducking, MD  gabapentin (NEURONTIN) 100 MG capsule Take 1 capsule (100 mg total) by mouth at bedtime. 01/09/19  Yes Fayrene Helper, MD  methotrexate (RHEUMATREX) 2.5 MG tablet Take 4 tablets (10 mg total) by mouth once a week. Caution:Chemotherapy. Protect from light. 10/24/18  Yes Kathrynn Ducking, MD  predniSONE (DELTASONE) 1 MG tablet Begin taking 4 tablets daily, taper by 1 tablet every 6 weeks until off 10/25/18  Yes Kathrynn Ducking, MD  triamterene-hydrochlorothiazide [facility]) 75-50 MG tablet TAKE 1 TABLET EVERY DAY 10/31/18  Yes Fayrene Helper, MD  furosemide (LASIX) 20 MG tablet Take 2 tablets (40 mg total) by mouth daily as needed (swelling). 02/05/19   Rolland Porter, MD  Misc Natural Products (OSTEO BI-FLEX JOINT SHIELD) TABS Take 1 tablet by mouth daily.    [provider]  polyethylene glycol powder (MIRALAX) 17 GM/SCOOP powder 1 SCOOP IN 8 OZ LIQUID EVERY MON, WED, AND FRI. HOLD FOR DIARRHEA. 07/21/18   Fields, Marga Melnick, MD  predniSONE (DELTASONE) 5 MG tablet Take 1 tablet (5 mg total) by mouth daily with breakfast. 10/25/18   Kathrynn Ducking, MD  UNABLE TO FIND Lift chair x 1  DX M48.06, M53.87  10/18/18   Fayrene Helper, MD    Family History Family History  Problem Relation Age of Onset  . Heart disease Father   . Bladder Cancer Brother        in remission   . Hypertension Sister   . Dementia Sister   . Diabetes type II Sister   . Hypertension Sister   . Aneurysm Sister   . Hypertension Brother   . Hypertension Son   . Colon cancer Neg Hx     Social History Social History   Tobacco Use  . Smoking status: Never Smoker  . Smokeless tobacco: Never Used  . Tobacco comment: Never smoked  Substance  Use Topics  . Alcohol use: No  . Drug use: No  uses a walker   Allergies   Cellcept [mycophenolate mofetil], Statins, Ciprofloxacin hcl, Hydrocodone, and Penicillins   Review of Systems Review of Systems  All other systems reviewed and are negative.    Physical Exam Updated Vital Signs BP 136/81   Pulse 87   Temp 98.1 F (36.7 C) (Oral)   Resp 19   Ht 5' 6"  (1.676 m)   Wt 62.6 kg   SpO2 100%   BMI 22.27 kg/m   Physical Exam Vitals signs and nursing note reviewed.  Constitutional:      General: She is not in acute distress.    Appearance: Normal appearance. She is well-developed. She is not ill-appearing or toxic-appearing.  HENT:     Head: Normocephalic and atraumatic.     Right Ear: External ear normal.     Left Ear: External ear normal.     Nose: Nose normal. No mucosal edema or rhinorrhea.     Mouth/Throat:     Dentition: No dental abscesses.     Pharynx: No uvula swelling.  Eyes:     Extraocular Movements: Extraocular movements intact.     Conjunctiva/sclera: Conjunctivae normal.     Pupils: Pupils are equal, round, and reactive to light.  Neck:     Musculoskeletal: Full passive range of motion without pain, normal range of motion and neck supple.  Cardiovascular:     Rate and Rhythm: Normal rate and regular rhythm.     Heart sounds: Normal heart sounds. No murmur. No friction rub. No gallop.   Pulmonary:     Effort: Pulmonary effort  is normal. No respiratory distress.     Breath sounds: Normal breath sounds. No wheezing, rhonchi or rales.  Chest:     Chest wall: No tenderness or crepitus.  Abdominal:     Tenderness: There is no abdominal tenderness. There is no rebound.  Musculoskeletal: Normal range of motion.        General: Swelling present. No tenderness.     Comments: Moves all extremities well.  Patient is noted to have some diffuse swelling of her lower extremities with only mild pitting around her ankles.  Skin:    General: Skin is warm and dry.     Coloration: Skin is not pale.     Findings: No erythema or rash.  Neurological:     General: No focal deficit present.     Mental Status: She is alert and oriented to person, place, and time.     Cranial Nerves: No cranial nerve deficit.  Psychiatric:        Mood and Affect: Mood normal. Mood is not anxious.        Speech: Speech normal.        Behavior: Behavior normal.        Thought Content: Thought content normal.      ED Treatments / Results  Labs (all labs ordered are listed, but only abnormal results are displayed) Results for orders placed or performed during the hospital encounter of 02/04/19  CBC with Differential  Result Value Ref Range   WBC 6.4 4.0 - 10.5 K/uL   RBC 3.39 (L) 3.87 - 5.11 MIL/uL   Hemoglobin 10.5 (L) 12.0 - 15.0 g/dL   HCT 34.2 (L) 36.0 - 46.0 %   MCV 100.9 (H) 80.0 - 100.0 fL   MCH 31.0 26.0 - 34.0 pg   MCHC 30.7 30.0 - 36.0 g/dL  RDW 13.4 11.5 - 15.5 %   Platelets 185 150 - 400 K/uL   nRBC 0.0 0.0 - 0.2 %   Neutrophils Relative % 77 %   Neutro Abs 4.9 1.7 - 7.7 K/uL   Lymphocytes Relative 10 %   Lymphs Abs 0.6 (L) 0.7 - 4.0 K/uL   Monocytes Relative 11 %   Monocytes Absolute 0.7 0.1 - 1.0 K/uL   Eosinophils Relative 1 %   Eosinophils Absolute 0.0 0.0 - 0.5 K/uL   Basophils Relative 0 %   Basophils Absolute 0.0 0.0 - 0.1 K/uL   Immature Granulocytes 1 %   Abs Immature Granulocytes 0.05 0.00 - 0.07 K/uL   Comprehensive metabolic panel  Result Value Ref Range   Sodium 142 135 - 145 mmol/L   Potassium 4.2 3.5 - 5.1 mmol/L   Chloride 108 98 - 111 mmol/L   CO2 26 22 - 32 mmol/L   Glucose, Bld 115 (H) 70 - 99 mg/dL   BUN 35 (H) 8 - 23 mg/dL   Creatinine, Ser 0.94 0.44 - 1.00 mg/dL   Calcium 9.2 8.9 - 10.3 mg/dL   Total Protein 6.2 (L) 6.5 - 8.1 g/dL   Albumin 3.7 3.5 - 5.0 g/dL   AST 26 15 - 41 U/L   ALT 22 0 - 44 U/L   Alkaline Phosphatase 42 38 - 126 U/L   Total Bilirubin 0.5 0.3 - 1.2 mg/dL   GFR calc non Af Amer 56 (L) >60 mL/min   GFR calc Af Amer >60 >60 mL/min   Anion gap 8 5 - 15  Brain natriuretic peptide  Result Value Ref Range   B Natriuretic Peptide 62.0 0.0 - 100.0 pg/mL  D-dimer, quantitative  Result Value Ref Range   D-Dimer, Quant 19.10 (H) 0.00 - 0.50 ug/mL-FEU    Laboratory interpretation all normal except stable mild anemia, persistently elevated BUN, very elevated D-dimer even when corrected for age   EKG None  Radiology No results found.  Procedures Procedures (including critical care time)  Medications Ordered in ED Medications  enoxaparin (LOVENOX) injection 65 mg (has no administration in time range)     Initial Impression / Assessment and Plan / ED Course  I have reviewed the triage vital signs and the nursing notes.  Pertinent labs & imaging results that were available during my care of the patient were reviewed by me and considered in my medical decision making (see chart for details).       I discussed with the patient that things that can cause swelling of the legs, heart problems, kidney problems, liver problems, blood clots in the legs or some other type of vascular problem.  Laboratory testing was done to look at all of these issues.  Patient's lab work has returned.  She does have a very elevated D-dimer.  1:50 AM patient again denies chest pain or shortness of breath, her pulse ox has been 100% and she has not been tachycardic.   She was given Lovenox 1 mg/kg subcu and we discussed coming back in the morning to get an outpatient venous study of her lower extremities to check for DVT.  Final Clinical Impressions(s) / ED Diagnoses   Final diagnoses:  Peripheral edema    ED Discharge Orders         Ordered    US Venous Img Lower Bilateral (DVT)     02/05/19 0156    furosemide (LASIX) 20 MG tablet  Daily PRN     02/05/19  [place] discharge  Rolland Porter, MD, Barbette Or, MD 02/05/19 7158

## 2019-02-05 NOTE — Discharge Instructions (Addendum)
Call [phone] in the morning around 8:30 AM to get an appointment time to return to get the Doppler ultrasound of your lower legs to check for blood clots.  You received a shot of Lovenox tonight which is a blood thinner and it will last until around 2 p.m. later today.  Return to the emergency department if you get chest pain or shortness of breath.  If you have a blood clot the ED doctor  will discuss with you what you need to do.  If you do not have a blood clot get the prescription for furosemide filled and take it once a day for the next 2 days or until the swelling is gone.  Please let your primary care doctor know about your ED visit tonight so she is aware of the swelling in your legs.

## 2019-02-07 ENCOUNTER — Encounter: Payer: Self-pay | Admitting: Family Medicine

## 2019-02-07 ENCOUNTER — Ambulatory Visit (INDEPENDENT_AMBULATORY_CARE_PROVIDER_SITE_OTHER): Payer: Medicare HMO | Admitting: Family Medicine

## 2019-02-07 ENCOUNTER — Other Ambulatory Visit: Payer: Self-pay

## 2019-02-07 VITALS — BP 136/81 | Ht 66.0 in | Wt 138.0 lb

## 2019-02-07 DIAGNOSIS — Z09 Encounter for follow-up examination after completed treatment for conditions other than malignant neoplasm: Secondary | ICD-10-CM

## 2019-02-07 DIAGNOSIS — Z9181 History of falling: Secondary | ICD-10-CM | POA: Diagnosis not present

## 2019-02-07 DIAGNOSIS — I82412 Acute embolism and thrombosis of left femoral vein: Secondary | ICD-10-CM

## 2019-02-07 DIAGNOSIS — I10 Essential (primary) hypertension: Secondary | ICD-10-CM | POA: Diagnosis not present

## 2019-02-07 HISTORY — DX: Acute embolism and thrombosis of left femoral vein: I82.412

## 2019-02-07 MED ORDER — FUROSEMIDE 20 MG PO TABS: ORAL_TABLET | ORAL | 0 refills | Status: DC

## 2019-02-07 NOTE — Patient Instructions (Addendum)
F/U in February as before ,call if you need me sooner  Eliquis will be prescribed for an additional 3 months, thios is being being sent to [facility] for your furosemide which was sent there on 02/05/2019  Careful NO MORE FALLS, we will refer you for twice weekly in home PT for next 4 to 6 weeks  I am referring you to hematology re help with DVT   Understanding Your Risk for Falls Each year, millions of people suffer serious injuries from falls. It is important to understand your risk for falling. Talk with your health care provider about your risk and what you can do to lower it. There are actions you can take at home to lower your risk. If you do have a serious fall, it is important to tell your health care provider. Falling once raises your risk for falling again. How can falls affect me? Serious injuries from falls are common. These include:  Broken bones. Most hip fractures are caused by falls.  Traumatic brain injury (TBI). Falls are the most common cause of TBI. Fear of falling can also cause you to avoid activities and stay at home. This can make your muscles weaker and actually raise your risk for a fall. What can increase my risk? Serious injuries from a fall most often happen to people older than age 76. Children and young adults ages 50-29 are also at higher risk. The more risk factors you have for falling, the higher your risk. Risk factors include:  Weakness in the lower body.  Lack (deficiency) of vitamin D.  Weak bones (osteoporosis).  Being generally weak or confused due to long-term (chronic) illness.  Dizziness or balance problems.  Poor vision.  Having depression.  Medicine that causes dizziness or drowsiness. These can include medicines for your blood pressure, heart, anxiety, insomnia, or edema, as well as pain medicines and muscle relaxants.  Drinking alcohol.  Foot pain or improper footwear.  Working at a dangerous job.  Having had a  fall in the past.  Tripping hazards at home, such as floor clutter or loose rugs, or poor lighting.  Having pets or clutter in your home. What actions can I take to lower my risk of falling?      Maintain physical fitness: ? Do strength and balance exercises. Consider taking a regular class to build strength and balance. Yoga and tai chi are good options. ? Have your eyes checked every year and your vision prescription updated as needed.  Remove all clutter from walkways and stairways, including extension cords.  Use a cordless phone.  Do not use throw rugs. Make sure all carpeting is taped or tacked down securely.  Use good lighting in all rooms. Keep a flashlight near your bed.  Make sure there is a clear path from your bed to the bathroom. Use night-lights.  Install grab bars for your tub, shower, and toilet. Use a bath mat in your tub or shower.  Attach secure railings on both sides of your stairs.  Repair uneven or broken steps.  Use a cane or walker as directed by your health care provider.  Wear nonskid shoes. Do not wear high heels. Do not walk around the house in socks or slippers.  Avoid walking on icy or slippery surfaces. Walk on the grass instead of on icy or slick sidewalks. Where you can, use ice melt to get rid of ice on walkways. Questions to ask your health care provider  Can you help  me evaluate my risk for a fall?  Do any of my medicines make me more likely to fall?  Should I take a vitamin D supplement?  What exercises can I do to improve my strength and balance?  Should I make an appointment to have my vision checked?  Do I need a bone density test to check for osteoporosis?  Would it help to use a cane or a walker? Where to find more information  Centers for Disease Control and Prevention, STEADI: StoreMirror.com.cy  Community-Based Fall Prevention Programs: StoreMirror.com.cy  [employer] on Aging: ToneConnect.com.ee Contact a health care provider  if:  You fall at home.  You are afraid of falling at home.  You feel weak, drowsy, or dizzy at home. Summary  People 25 and older are at high risk for falling. However, older people are not the only ones injured in falls. Children and young adults have a higher-than-normal risk, too.  Talk with your health care provider about your risks for falling and how to lower those risks.  Taking certain precautions at home can lower your risk for falling.  If you fall, always tell your health care provider. This information is not intended to replace advice given to you by your health care provider. Make sure you discuss any questions you have with your health care provider. Document Released: 10/21/2016 Document Revised: 06/08/2017 Document Reviewed: 10/21/2016 Elsevier Patient Education  2020 [employer].

## 2019-02-08 MED ORDER — APIXABAN 5 MG PO TABS: 5.0000 mg | ORAL_TABLET | Freq: Two times a day (BID) | ORAL | 0 refills | Status: DC

## 2019-02-08 NOTE — Progress Notes (Signed)
Virtual Visit via Telephone Note  I connected with [name] [name] on 02/08/19 at 10:20 AM EST by telephone and verified that I am speaking with the correct person using two identifiers.  Location: Patient: home Provider: office   I discussed the limitations, risks, security and privacy concerns of performing an evaluation and management service by telephone and the availability of in person appointments. I also discussed with the patient that there may be a patient responsible charge related to this service. The patient expressed understanding and agreed to proceed.   History of Present Illness: F/u from recent Ed visit when a diagnosis of DVT was made. Pt seems unclear as to  her exact dx, she went to the Ed this past Saturday and had to return the following day for an [place] to establish her dx She has lasix short term and eliqis prescribed long term I will have hematology help with determining duration of treatment , and as she remains at high risk due to immobility, also has high fall risk C/o bilateral leg swelling and unsteady gait with poor mobility.Interested in and needs therapy in home as she is house bound She had fallen a few days before presenting with DVT, I assured her that the fall though it caused bruising , did not cause the clot  Denies shortness of breath or hemoptysis. Observations/Objective: BP 136/81   Ht 5' 6"  (1.676 m)   Wt 138 lb (62.6 kg)   BMI 22.27 kg/m  Good communication with no confusion and intact memory. Alert and oriented x 3 No signs of respiratory distress during speech     Assessment and Plan: Acute deep vein thrombosis (DVT) of left femoral vein (HCC) High risk of recurrence  due to immobility, high risk of falls with potential complications as on anti coagulants. Will rx 4 months total and get hematology input  At high risk for falls per [name] [name] fall risk assessment scale Impaired mobility due to orthopedic and neurologic diseases. Recent fall  resulting in significant bruising. Impaired mobility, will benefit from and needs in home pT twice weekly for 6 weeks, will refer, she is in agreement and grateful for the help  Essential hypertension Controlled, no change in medication   Encounter for examination following treatment at hospital ED evaluation on 11/14/202 for leg swelling, dx on 02/05/2019 wit dVT left leg Both lasix , short term, and eliquis are prescribed. Hospital course and management are discussed with the patient, she reports uderstanding    Follow Up Instructions:    I discussed the assessment and treatment plan with the patient. The patient was provided an opportunity to ask questions and all were answered. The patient agreed with the plan and demonstrated an understanding of the instructions.   The patient was advised to call back or seek an in-person evaluation if the symptoms worsen or if the condition fails to improve as anticipated.  I provided 25 minutes of non-face-to-face time during this encounter.   [name], MD

## 2019-02-08 NOTE — Assessment & Plan Note (Signed)
High risk of recurrence  due to immobility, high risk of falls with potential complications as on anti coagulants. Will rx 4 months total and get hematology input

## 2019-02-09 ENCOUNTER — Encounter: Payer: Self-pay | Admitting: Family Medicine

## 2019-02-09 DIAGNOSIS — Z9181 History of falling: Secondary | ICD-10-CM | POA: Insufficient documentation

## 2019-02-09 NOTE — Assessment & Plan Note (Signed)
Controlled, no change in medication  

## 2019-02-09 NOTE — Assessment & Plan Note (Signed)
Impaired mobility due to orthopedic and neurologic diseases. Recent fall resulting in significant bruising. Impaired mobility, will benefit from and needs in home pT twice weekly for 6 weeks, will refer, she is in agreement and grateful for the help

## 2019-02-09 NOTE — Assessment & Plan Note (Signed)
ED evaluation on 11/14/202 for leg swelling, dx on 02/05/2019 wit dVT left leg Both lasix , short term, and eliquis are prescribed. Hospital course and management are discussed with the patient, she reports uderstanding

## 2019-02-11 ENCOUNTER — Other Ambulatory Visit: Payer: Self-pay | Admitting: Gastroenterology

## 2019-02-13 ENCOUNTER — Ambulatory Visit (HOSPITAL_COMMUNITY): Payer: Medicare HMO

## 2019-02-14 ENCOUNTER — Other Ambulatory Visit: Payer: Self-pay

## 2019-02-14 ENCOUNTER — Encounter (HOSPITAL_COMMUNITY): Payer: Self-pay | Admitting: Surgery

## 2019-02-15 ENCOUNTER — Encounter (HOSPITAL_COMMUNITY): Payer: Self-pay | Admitting: Hematology

## 2019-02-15 ENCOUNTER — Inpatient Hospital Stay (HOSPITAL_COMMUNITY): Payer: Medicare HMO | Attending: Hematology | Admitting: Hematology

## 2019-02-15 VITALS — BP 125/91 | HR 80 | Temp 96.9°F | Resp 20 | Ht 64.0 in | Wt 142.4 lb

## 2019-02-15 DIAGNOSIS — C549 Malignant neoplasm of corpus uteri, unspecified: Secondary | ICD-10-CM

## 2019-02-15 DIAGNOSIS — Z90710 Acquired absence of both cervix and uterus: Secondary | ICD-10-CM | POA: Diagnosis not present

## 2019-02-15 DIAGNOSIS — Z7901 Long term (current) use of anticoagulants: Secondary | ICD-10-CM | POA: Diagnosis not present

## 2019-02-15 DIAGNOSIS — I82412 Acute embolism and thrombosis of left femoral vein: Secondary | ICD-10-CM

## 2019-02-15 DIAGNOSIS — Z8542 Personal history of malignant neoplasm of other parts of uterus: Secondary | ICD-10-CM | POA: Diagnosis not present

## 2019-02-15 NOTE — Assessment & Plan Note (Signed)
1.  Weakly provoked left leg DVT: -Patient reportedly fell few days prior to presentation to the ER on 02/05/2019 with left leg swelling. -Doppler on 02/05/2019 showed predominantly occlusive DVT extending from the proximal left femoral vein through left popliteal vein.  Occlusive SVT involving the left lesser saphenous vein.  No evidence of DVT or SVT in the right lower extremity.  Incidentally noted approximately 4.8 cm right sided Baker's cyst.  Also noted 12.7 cm fluid collection with in the subcutaneous tissues of the right calf, potentially leaking Baker's cyst. -She is started on Eliquis which she is tolerating well. -According to her son, she is not been mobile for the past few months.  She has been doing minimal activities at home. -Last mammogram on 09/09/2017 was BI-RADS Category 1. -She has fallen at least 2 times in the last 3 months.  She is at high risk for falls given her spinal stenosis and myopathy. -We will plan to do a CT scan of the abdomen and pelvis to evaluate for any occult malignancy especially given her history of endometrial cancer. -We will also plan to follow-up in 3 months to assess risk-benefit ratio.  If the risks outweigh benefits, we will discontinue her anticoagulation after 3 months and check a D-dimer level and repeat it 1 month after she has been off Eliquis.  2.  Endometrial adenocarcinoma: -She had T1b N0 well-differentiated endometrioid adenocarcinoma, FIGO grade 1, TAH/BSO with lymphadenectomy done on 12/14/2006.  She did not require any adjuvant therapies.  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 -She is currently receiving methotrexate 4 tablets weekly.  She is also on prednisone taper. -She thinks that methotrexate is not helping her.

## 2019-02-15 NOTE — Progress Notes (Signed)
[facility] Initial Visit:  Patient Care Team: Fayrene Helper, MD as PCP - General Carole Civil, MD as Consulting Physician (Orthopedic Surgery) Irine Seal, MD as Attending Physician (Urology) Danie Binder, MD as Consulting Physician (Gastroenterology) Jovita Gamma, MD as Consulting Physician (Neurosurgery)  CHIEF COMPLAINTS/PURPOSE OF CONSULTATION: - New onset DVT   HISTORY OF PRESENTING ILLNESS: [name] [name] 85 y.o. female presents today for consult regarding new onset DVT.  She has a new diagnosis of psoriatic arthritis.  She was recently started on Cellcept and developed mouth sores.  This was discontinued and she was started on methotrexate. She has been having swelling of her lower extremities for at least a year or more.  She states normally in the morning they are not swollen and as the day goes on to get swollen.  Patient states she has been told it is because she has arthritis.  She states she needs a total knee replacement on the right.  She states however the last 2 days they have been swollen when she got up in the morning.  She denies any change in her activity and states she gets up every day and she uses a walker to ambulate due to her underlying arthritis.  She does states she fell about a week ago, she was going down one step and she fell backwards onto her left side and hit her left knee.  She denies loss of consciousness.  She has a history of peripheral neuropathy.  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  She reports she has been on a taper of prednisone for the past 16 weeks.  She is currently on 7 mg daily.  She denies a previous history of clot.  She has a history of uterine cancer in 2012.  She underwent hysterectomy.  No chemo or radiation was administered.  She has a family history significant for breast cancer in her sister and prostate and bladder cancer in her  brother.  Review of Systems  Constitutional: Positive for fatigue.  HENT:  Negative.   Eyes: Negative.   Respiratory: Negative.   Cardiovascular: Negative.   Gastrointestinal: Negative.   Endocrine: Negative.   Genitourinary: Negative.    Musculoskeletal: Positive for arthralgias, back pain, gait problem and myalgias.  Skin: Negative.   Neurological: Positive for extremity weakness and gait problem.  Hematological: Negative.   Psychiatric/Behavioral: Negative.     MEDICAL HISTORY: Past Medical History:  Diagnosis Date  . Arthritis    spinal stenosis  . Elevated LFTs    secondary to fatty liver, negative work-up in 2011  . Hypercholesterolemia   . Hypertension   . Lumbar spinal stenosis 01/14/2018   L3-4 and L4-5  . Myopathy 02/15/2018  . Osteoarthritis    right knee  . Peripheral neuropathy 07/06/2018  . PONV (postoperative nausea and vomiting)   . Uterine cancer ([place]) 08/2006   grade 1, no recurrence up to 2013    SURGICAL HISTORY: Past Surgical History:  Procedure Laterality Date  . 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 APPENDECTOMY  1973  . BREAST BIOPSY Left 2018   Benign  . CHOLECYSTECTOMY  1973  . COLONOSCOPY    06/21/2007   IRJ:JOACZY rectum/Sigmoid diverticula, diminutive hepatic flexure polyp s/p bx. Benign.   . COLONOSCOPY N/A 10/17/2012   Dr. Oneida Alar- moderate diverticulosis was noted in the sigmoid colon, moderate sized internal hemorrhoids. next tcs  in10 years  . ESOPHAGOGASTRODUODENOSCOPY N/A 03/20/2016   Dr. Oneida Alar, widely patent Schatzki ring, anemia due to ASA induced erosive gastritis, mild duodenitis. gastric bx benign without H.pylori.   [facility] FOOT SURGERY  2007   Pins in toes on left foot, 5 to 6 yrs ago  . GIVENS CAPSULE STUDY N/A 03/20/2016   Procedure: GIVENS CAPSULE STUDY;  Surgeon: Danie Binder, MD;  Location: [facility];  Service: Endoscopy;  Laterality: N/A;  . MUSCLE BIOPSY Left 06/07/2018   Procedure: LEFT THIGH  MUSCLE BIOPSY;  Surgeon: Erroll Luna, MD;  Location: [facility];  Service: General;  Laterality: Left;  . TONSILLECTOMY      SOCIAL HISTORY: Social History   Socioeconomic History  . Marital status: Married    Spouse name: Jeneen Rinks   . Number of children: 3  . Years of education: 38  . Highest education level: 12th grade  Occupational History  . Occupation: retired   Scientific laboratory technician  . Financial resource strain: Not hard at all  . Food insecurity    Worry: Never true    Inability: Never true  . Transportation needs    Medical: No    Non-medical: No  Tobacco Use  . Smoking status: Never Smoker  . Smokeless tobacco: Never Used  . Tobacco comment: Never smoked  Substance and Sexual Activity  . Alcohol use: No  . Drug use: No  . Sexual activity: Not Currently    Birth control/protection: Post-menopausal, Surgical  Lifestyle  . Physical activity    Days per week: 0 days    Minutes per session: 0 min  . Stress: To some extent  Relationships  . Social connections    Talks on phone: More than three times a week    Gets together: Once a week    Attends religious service: 1 to 4 times per year    Active member of club or organization: No    Attends meetings of clubs or organizations: Never    Relationship status: Widowed  . Intimate partner violence    Fear of current or ex partner: No    Emotionally abused: No    Physically abused: No    Forced sexual activity: No  Other Topics Concern  . Not on file  Social History Narrative   Husband Jeneen Rinks recently passed away   Caffeine use: soda daily   Right handed     FAMILY HISTORY Family History  Problem Relation Age of Onset  . Heart disease Father   . Bladder Cancer Brother        in remission   . Hypertension Sister   . Dementia Sister   . Diabetes type II Sister   . Hypertension Sister   . Aneurysm Sister   . Hypertension Brother   . Arthritis Brother   . Hypertension Son   . Colon cancer Neg Hx      ALLERGIES:  is allergic to cellcept [mycophenolate mofetil]; statins; ciprofloxacin hcl; hydrocodone; and penicillins.  MEDICATIONS:  Current Outpatient Medications  Medication Sig Dispense Refill  . apixaban (ELIQUIS) 5 MG TABS tablet Take 5 mg by mouth. Take two twice a day for one week then one twice daily for a month    . [START ON 03/16/2019] apixaban (ELIQUIS) 5 MG TABS tablet Take 1 tablet (5 mg total) by mouth 2 (two) times daily. 180 tablet 0  . Chromium-Cinnamon [phone] MCG-MG CAPS Take 2 tablets by mouth daily.    . Docusate Calcium (STOOL  SOFTENER PO) Take by mouth daily as needed.    . ezetimibe (ZETIA) 10 MG tablet Take 1 tablet (10 mg total) by mouth daily. 90 tablet 3  . folic acid (FOLVITE) 1 MG tablet Take 1 tablet (1 mg total) by mouth daily. 90 tablet 3  . furosemide (LASIX) 20 MG tablet Take 2 tablets once daily, as needed, for leg swelling 6 tablet 0  . gabapentin (NEURONTIN) 100 MG capsule Take 1 capsule (100 mg total) by mouth at bedtime. 90 capsule 3  . methotrexate (RHEUMATREX) 2.5 MG tablet Take 4 tablets (10 mg total) by mouth once a week. Caution:Chemotherapy. Protect from light. 48 tablet 1  . Misc Natural Products (OSTEO BI-FLEX JOINT SHIELD) TABS Take 1 tablet by mouth daily.    . polyethylene glycol powder (MIRALAX) 17 GM/SCOOP powder 1 SCOOP IN 8 OZ LIQUID EVERY MON, WED, AND FRI. HOLD FOR DIARRHEA. (Patient taking differently: as needed. ) 255 g 11  . predniSONE (DELTASONE) 1 MG tablet Begin taking 4 tablets daily, taper by 1 tablet every 6 weeks until off 120 tablet 3  . predniSONE (DELTASONE) 5 MG tablet Take 1 tablet (5 mg total) by mouth daily with breakfast. 90 tablet 2  . triamterene-hydrochlorothiazide (MAXZIDE) 75-50 MG tablet TAKE 1 TABLET EVERY DAY 90 tablet 1  . UNABLE TO FIND Lift chair x 1  DX M48.06, M53.87 1 each 0   No current facility-administered medications for this visit.     PHYSICAL EXAMINATION:  ECOG PERFORMANCE STATUS: 1 -  Symptomatic but completely ambulatory   Vitals:   02/15/19 1146  BP: (!) 125/91  Pulse: 80  Resp: 20  Temp: (!) 96.9 F (36.1 C)  SpO2: 99%    Filed Weights   02/15/19 1146  Weight: 142 lb 6.4 oz (64.6 kg)     Physical Exam Vitals signs reviewed.  Constitutional:      Appearance: Normal appearance.  Cardiovascular:     Rate and Rhythm: Normal rate and regular rhythm.     Heart sounds: Normal heart sounds.  Pulmonary:     Effort: Pulmonary effort is normal.     Breath sounds: Normal breath sounds.  Abdominal:     General: There is no distension.     Palpations: Abdomen is soft. There is no mass.  Musculoskeletal:     Right lower leg: Edema present.     Left lower leg: Edema present.  Lymphadenopathy:     Cervical: No cervical adenopathy.  Skin:    General: Skin is warm.  Neurological:     General: No focal deficit present.     Mental Status: She is alert and oriented to person, place, and time.  Psychiatric:        Mood and Affect: Mood normal.        Behavior: Behavior normal.      LABORATORY DATA: I have personally reviewed the data as listed:  Admission on 02/04/2019, Discharged on 02/05/2019  Component Date Value Ref Range Status  . WBC 02/05/2019 6.4  4.0 - 10.5 K/uL Final  . RBC 02/05/2019 3.39* 3.87 - 5.11 MIL/uL Final  . Hemoglobin 02/05/2019 10.5* 12.0 - 15.0 g/dL Final  . HCT 02/05/2019 34.2* 36.0 - 46.0 % Final  . MCV 02/05/2019 100.9* 80.0 - 100.0 fL Final  . MCH 02/05/2019 31.0  26.0 - 34.0 pg Final  . MCHC 02/05/2019 30.7  30.0 - 36.0 g/dL Final  . RDW 02/05/2019 13.4  11.5 - 15.5 % Final  .  Platelets 02/05/2019 185  150 - 400 K/uL Final  . nRBC 02/05/2019 0.0  0.0 - 0.2 % Final  . Neutrophils Relative % 02/05/2019 77  % Final  . Neutro Abs 02/05/2019 4.9  1.7 - 7.7 K/uL Final  . Lymphocytes Relative 02/05/2019 10  % Final  . Lymphs Abs 02/05/2019 0.6* 0.7 - 4.0 K/uL Final  . Monocytes Relative 02/05/2019 11  % Final  . Monocytes  Absolute 02/05/2019 0.7  0.1 - 1.0 K/uL Final  . Eosinophils Relative 02/05/2019 1  % Final  . Eosinophils Absolute 02/05/2019 0.0  0.0 - 0.5 K/uL Final  . Basophils Relative 02/05/2019 0  % Final  . Basophils Absolute 02/05/2019 0.0  0.0 - 0.1 K/uL Final  . Immature Granulocytes 02/05/2019 1  % Final  . Abs Immature Granulocytes 02/05/2019 0.05  0.00 - 0.07 K/uL Final   Performed at [facility], [address] [place], [place] [address]  . Sodium 02/05/2019 142  135 - 145 mmol/L Final  . Potassium 02/05/2019 4.2  3.5 - 5.1 mmol/L Final  . Chloride 02/05/2019 108  98 - 111 mmol/L Final  . CO2 02/05/2019 26  22 - 32 mmol/L Final  . Glucose, Bld 02/05/2019 115* 70 - 99 mg/dL Final  . BUN 02/05/2019 35* 8 - 23 mg/dL Final  . Creatinine, Ser 02/05/2019 0.94  0.44 - 1.00 mg/dL Final  . Calcium 02/05/2019 9.2  8.9 - 10.3 mg/dL Final  . Total Protein 02/05/2019 6.2* 6.5 - 8.1 g/dL Final  . Albumin 02/05/2019 3.7  3.5 - 5.0 g/dL Final  . AST 02/05/2019 26  15 - 41 U/L Final  . ALT 02/05/2019 22  0 - 44 U/L Final  . Alkaline Phosphatase 02/05/2019 42  38 - 126 U/L Final  . Total Bilirubin 02/05/2019 0.5  0.3 - 1.2 mg/dL Final  . GFR calc non Af Amer 02/05/2019 56* >60 mL/min Final  . GFR calc Af Amer 02/05/2019 >60  >60 mL/min Final  . Anion gap 02/05/2019 8  5 - 15 Final   Performed at [facility], [address] [place], [place] [address]  . B Natriuretic Peptide 02/05/2019 62.0  0.0 - 100.0 pg/mL Final   Performed at [facility], [address] [place], [place] [address]  . D-Dimer, Quant 02/05/2019 19.10* 0.00 - 0.50 ug/mL-FEU Final   Comment: (NOTE) At the manufacturer cut-off of 0.50 ug/mL FEU, this assay has been documented to exclude PE with a sensitivity and negative predictive value of 97 to 99%.  At this time, this assay has not been approved by the FDA to exclude DVT/VTE. Results should be correlated with clinical presentation. Performed at [facility], [address],  [place], [place] [address]     RADIOGRAPHIC STUDIES: I have personally reviewed the radiological images as listed and agree with the findings in the report   ASSESSMENT/PLAN   Acute deep vein thrombosis (DVT) of left femoral vein ([place]) 1.  Weakly provoked left leg DVT: -Patient reportedly fell few days prior to presentation to the ER on 02/05/2019 with left leg swelling. -Doppler on 02/05/2019 showed predominantly occlusive DVT extending from the proximal left femoral vein through left popliteal vein.  Occlusive SVT involving the left lesser saphenous vein.  No evidence of DVT or SVT in the right lower extremity.  Incidentally noted approximately 4.8 cm right sided Baker's cyst.  Also noted 12.7 cm fluid collection with in the subcutaneous tissues of the right calf, potentially leaking Baker's cyst. -She  is started on Eliquis which she is tolerating well. -According to her son, she is not been mobile for the past few months.  She has been doing minimal activities at home. -Last mammogram on 09/09/2017 was BI-RADS Category 1. -She has fallen at least 2 times in the last 3 months.  She is at high risk for falls given her spinal stenosis and myopathy. -We will plan to do a CT scan of the abdomen and pelvis to evaluate for any occult malignancy especially given her history of endometrial cancer. -We will also plan to follow-up in 3 months to assess risk-benefit ratio.  If the risks outweigh benefits, we will discontinue her anticoagulation after 3 months and check a D-dimer level and repeat it 1 month after she has been off Eliquis.  2.  Endometrial adenocarcinoma: -She had T1b N0 well-differentiated endometrioid adenocarcinoma, FIGO grade 1, TAH/BSO with lymphadenectomy done on 12/14/2006.  She did not require any adjuvant therapies.  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 -She is currently receiving  methotrexate 4 tablets weekly.  She is also on prednisone taper. -She thinks that methotrexate is not helping her.   Orders Placed This Encounter  Procedures  . CT Abdomen Pelvis W Contrast    Standing Status:   Future    Standing Expiration Date:   02/15/2020    Order Specific Question:   ** REASON FOR EXAM (FREE TEXT)    Answer:   Hx: uterine cancer    Order Specific Question:   If indicated for the ordered procedure, I authorize the administration of contrast media per Radiology protocol    Answer:   Yes    Order Specific Question:   Preferred imaging location?    Answer:   [facility]    Order Specific Question:   Is Oral Contrast requested for this exam?    Answer:   Yes, Per Radiology protocol    Order Specific Question:   Radiology Contrast Protocol - do NOT remove file path    Answer:   [email or web address]   History part was obtained by my nurse practitioner Carver Fila, [place].  I agree with the history part.  I have independently elicited history and examined the patient. All questions were answered. The patient knows to call the clinic with any problems, questions or concerns.  This note was electronically signed.    Derek Jack, MD  02/15/2019 6:07 PM

## 2019-02-20 ENCOUNTER — Other Ambulatory Visit: Payer: Self-pay

## 2019-02-20 ENCOUNTER — Ambulatory Visit (HOSPITAL_COMMUNITY)
Admission: RE | Admit: 2019-02-20 | Discharge: 2019-02-20 | Disposition: A | Discharge status: Home / Self Care | Payer: Medicare HMO | Source: Ambulatory Visit | Attending: Family Medicine | Admitting: Family Medicine

## 2019-02-20 DIAGNOSIS — Z1231 Encounter for screening mammogram for malignant neoplasm of breast: Secondary | ICD-10-CM | POA: Insufficient documentation

## 2019-02-23 ENCOUNTER — Telehealth: Payer: Self-pay

## 2019-02-23 DIAGNOSIS — Z9181 History of falling: Secondary | ICD-10-CM

## 2019-02-23 DIAGNOSIS — M48062 Spinal stenosis, lumbar region with neurogenic claudication: Secondary | ICD-10-CM

## 2019-02-23 DIAGNOSIS — R296 Repeated falls: Secondary | ICD-10-CM

## 2019-02-23 NOTE — Telephone Encounter (Signed)
-----   Message from Fayrene Helper, MD sent at 02/09/2019  6:00 AM EST ----- Regarding: plks refer for in home PT , visit is closed, tx

## 2019-02-24 ENCOUNTER — Ambulatory Visit: Payer: Medicare HMO

## 2019-03-01 ENCOUNTER — Ambulatory Visit: Payer: Medicare HMO | Admitting: Neurology

## 2019-03-06 ENCOUNTER — Ambulatory Visit (HOSPITAL_COMMUNITY)
Admission: RE | Admit: 2019-03-06 | Discharge: 2019-03-06 | Disposition: A | Discharge status: Home / Self Care | Payer: Medicare HMO | Source: Ambulatory Visit | Attending: Hematology | Admitting: Hematology

## 2019-03-06 ENCOUNTER — Other Ambulatory Visit: Payer: Self-pay

## 2019-03-06 DIAGNOSIS — C549 Malignant neoplasm of corpus uteri, unspecified: Secondary | ICD-10-CM | POA: Insufficient documentation

## 2019-03-06 DIAGNOSIS — K5730 Diverticulosis of large intestine without perforation or abscess without bleeding: Secondary | ICD-10-CM | POA: Diagnosis not present

## 2019-03-06 MED ORDER — IOHEXOL 300 MG/ML  SOLN
100.0000 mL | Freq: Once | INTRAMUSCULAR | Status: AC | PRN
  Administered 2019-03-06: 100 mL via INTRAVENOUS

## 2019-03-08 ENCOUNTER — Other Ambulatory Visit: Payer: Self-pay

## 2019-03-08 ENCOUNTER — Inpatient Hospital Stay (HOSPITAL_COMMUNITY): Payer: Medicare HMO | Attending: Hematology | Admitting: Hematology

## 2019-03-08 ENCOUNTER — Encounter (HOSPITAL_COMMUNITY): Payer: Self-pay | Admitting: Hematology

## 2019-03-08 DIAGNOSIS — Z7901 Long term (current) use of anticoagulants: Secondary | ICD-10-CM | POA: Diagnosis not present

## 2019-03-08 DIAGNOSIS — N3289 Other specified disorders of bladder: Secondary | ICD-10-CM | POA: Diagnosis not present

## 2019-03-08 DIAGNOSIS — R350 Frequency of micturition: Secondary | ICD-10-CM

## 2019-03-08 DIAGNOSIS — Z9181 History of falling: Secondary | ICD-10-CM

## 2019-03-08 DIAGNOSIS — I82412 Acute embolism and thrombosis of left femoral vein: Secondary | ICD-10-CM | POA: Diagnosis not present

## 2019-03-08 NOTE — Progress Notes (Signed)
Virtual Visit via Telephone Note  I connected with [name] [name] on 03/08/19 at  4:05 PM EST by telephone and verified that I am speaking with the correct person using two identifiers.   I discussed the limitations, risks, security and privacy concerns of performing an evaluation and management service by telephone and the availability of in person appointments. I also discussed with the patient that there may be a patient responsible charge related to this service. The patient expressed understanding and agreed to proceed.   History of Present Illness: She was seen by me in the office on 02/15/2019 for weakly provoked left leg DVT.  She fell few days prior to presentation to the ER on 02/05/2019 with left leg swelling.  Doppler showed occlusive DVT extending from the proximal left femoral vein through left popliteal vein.  Occlusive SVT involving the left lesser saphenous vein.  No evidence of DVT or SVT in the right lower extremity.  She was started on Eliquis which she is tolerating well.   Observations/Objective: She denies any recent falls.  Last fall was one and half month ago.  She feels "chilly" since she started taking Eliquis.  Denies any fevers or infections.  Denies any hematuria.  Reports increased frequency of urination.  Assessment and Plan:  1.  Weakly provoked left leg DVT: -Continue Eliquis for at least 3 months. -We reviewed CT abdomen and pelvis with contrast dated 03/06/2019 which did not show any evidence of recurrent or metastatic disease. -We will see her back in 2 months with repeat D-dimer.  If the D-dimer is negative, we will discontinue Eliquis and repeat D-dimer in a month. -She will likely be poor candidate for long-term anticoagulation based on her history of falls.  2.  Endometrial adenocarcinoma: -She had T1b N0 well-differentiated endometrial adenocarcinoma, FIGO grade 1, TAH and BSO with lymphadenectomy done on 12/14/2006. -CT scan did not show any evidence of  recurrence.  3.  Bladder wall thickening: -CT scan showed mild mucosal enhancement along the left posterolateral bladder wall. -She reports increased frequency but denies any hematuria.  She used to see Dr. Jeffie Pollock few years ago.  I have recommended follow-up with him for possible cystoscopy.   Follow Up Instructions: RTC 2 months.   I discussed the assessment and treatment plan with the patient. The patient was provided an opportunity to ask questions and all were answered. The patient agreed with the plan and demonstrated an understanding of the instructions.   The patient was advised to call back or seek an in-person evaluation if the symptoms worsen or if the condition fails to improve as anticipated.  I provided 12 minutes of non-face-to-face time during this encounter.   Derek Jack, MD

## 2019-03-13 ENCOUNTER — Telehealth: Payer: Self-pay | Admitting: *Deleted

## 2019-03-13 NOTE — Telephone Encounter (Signed)
Referral was sent 12/3. Contacted kathleen Cheek at [facility] to find out why she hasn't been seen yet. Will await response

## 2019-03-13 NOTE — Telephone Encounter (Signed)
[name] [name] pt brother came by wanted to let dr simpson the home health therapy has not gotten in touch with patient and she is not doing too good in moving her arms etc. Would like to know if this will still be set up?

## 2019-03-14 NOTE — Telephone Encounter (Signed)
LVM for julius to call the office

## 2019-03-22 ENCOUNTER — Ambulatory Visit (INDEPENDENT_AMBULATORY_CARE_PROVIDER_SITE_OTHER): Payer: Medicare HMO | Admitting: Family Medicine

## 2019-03-22 ENCOUNTER — Other Ambulatory Visit: Payer: Self-pay

## 2019-03-22 VITALS — BP 122/74 | HR 89 | Temp 97.3°F | Resp 15 | Ht 66.0 in | Wt 151.0 lb

## 2019-03-22 DIAGNOSIS — I82412 Acute embolism and thrombosis of left femoral vein: Secondary | ICD-10-CM

## 2019-03-22 DIAGNOSIS — R600 Localized edema: Secondary | ICD-10-CM | POA: Diagnosis not present

## 2019-03-22 DIAGNOSIS — Z9181 History of falling: Secondary | ICD-10-CM

## 2019-03-22 DIAGNOSIS — M1711 Unilateral primary osteoarthritis, right knee: Secondary | ICD-10-CM | POA: Diagnosis not present

## 2019-03-22 DIAGNOSIS — T148XXA Other injury of unspecified body region, initial encounter: Secondary | ICD-10-CM

## 2019-03-22 DIAGNOSIS — M48062 Spinal stenosis, lumbar region with neurogenic claudication: Secondary | ICD-10-CM | POA: Diagnosis not present

## 2019-03-22 MED ORDER — FUROSEMIDE 20 MG PO TABS: ORAL_TABLET | ORAL | 0 refills | Status: DC

## 2019-03-22 MED ORDER — DOXYCYCLINE HYCLATE 100 MG PO TABS: 100.0000 mg | ORAL_TABLET | Freq: Two times a day (BID) | ORAL | 0 refills | Status: DC

## 2019-03-22 NOTE — Patient Instructions (Signed)
F/U as before, call if you need time sooner  Please take once daily lasix for 5 days along with one potassium tablet for leg swelling  5 day course of antibiotic is prescribed for mild cellulitis of legs  Pleae keep legs elevated , and cover the blister on your leg loosely with dry  gauze

## 2019-03-23 ENCOUNTER — Other Ambulatory Visit: Payer: Self-pay | Admitting: Family Medicine

## 2019-03-24 ENCOUNTER — Encounter: Payer: Self-pay | Admitting: Family Medicine

## 2019-03-24 DIAGNOSIS — T148XXA Other injury of unspecified body region, initial encounter: Secondary | ICD-10-CM | POA: Insufficient documentation

## 2019-03-24 NOTE — Assessment & Plan Note (Signed)
Significant lower extremity weakness and gait instabilitym requires walker for assistance and remains at high fall risk as leg sometimes suddenly gives out, will benefit from in home PT , also had recent near fall in her  home

## 2019-03-24 NOTE — Assessment & Plan Note (Signed)
Blister on left leg due to edema, increasing in size with surrounding mild erythema, antibiotic prescribed and skin care duiiscussed, return if superinfection develops or worsens, anticipate resolution in 10 days, elevate leg ad loose dry gauze for protection to blister

## 2019-03-24 NOTE — Assessment & Plan Note (Signed)
Recommend in home PT and referral made

## 2019-03-24 NOTE — Assessment & Plan Note (Addendum)
Short course of lasix and potassium as well as leg elevation Resultant left leg blister, enalrging x 1 week, mild erythema, short antibiotic course prescribed with directions for skin care, limit trauma to area

## 2019-03-24 NOTE — Assessment & Plan Note (Signed)
Increased fall risk with recent near fall, needs in home PT for safety and improved independence

## 2019-03-24 NOTE — Progress Notes (Signed)
   AUNDREYA SOUFFRANT     MRN: [id number]      DOB: 02/05/1933   HPI Ms. Fazekas is here with a 1 week h/o increased bilateral leg swelling and left leg ulcer, no fever, purulent drainage or known trauma. C/o recent    ROS Denies recent fever or chills. Denies sinus pressure, nasal congestion, ear pain or sore throat. Denies chest congestion, productive cough or wheezing. Denies chest pains, palpitations and leg swelling Denies abdominal pain, nausea, vomiting,diarrhea or constipation.   Denies dysuria, frequency, hesitancy or incontinence.  Denies headaches, seizures, numbness, or tingling. Reports improvement in depression , but under a lot of stress sorting out the affairs of her recently deceased spouse  PE  BP 122/74   Pulse 89   Temp (!) 97.3 F (36.3 C) (Temporal)   Resp 15   Ht 5' 6"  (1.676 m)   Wt 151 lb (68.5 kg)   SpO2 99%   BMI 24.37 kg/m   Patient alert and oriented and in no cardiopulmonary distress.  HEENT: No facial asymmetry, EOMI,     Neck supple .  Chest: Clear to auscultation bilaterally.  CVS: S1, S2 no murmurs, no S3.Regular rate.  ABD: Soft non tender.   Ext: No edema  MS: Markedly decreased  ROM spine, shoulders, hips and knees.  Skin: blister on left leg with mild erythema, diameter approx 3 cm  Psych: Good eye contact, normal affect. Memory intact not anxious or depressed appearing.  CNS: CN 2-12 intact, power,  normal throughout.no focal deficits noted.   Assessment & Plan  Lower leg edema Short course of lasix and potassium as well as leg elevation Resultant left leg blister, enalrging x 1 week, mild erythema, short antibiotic course prescribed with directions for skin care, limit trauma to area  Lumbar spinal stenosis Significant lower extremity weakness and gait instabilitym requires walker for assistance and remains at high fall risk as leg sometimes suddenly gives out, will benefit from in home PT , also had recent near fall in her   home  At high risk for falls per [name] fall risk assessment scale Recommend in home PT and referral made  Acute deep vein thrombosis (DVT) of left femoral vein ([facility])  currently anticoagulated, great need for fall prevention  Arthritis of right knee Increased fall risk with recent near fall, needs in home PT for safety and improved independence  Blister Blister on left leg due to edema, increasing in size with surrounding mild erythema, antibiotic prescribed and skin care duiiscussed, return if superinfection develops or worsens, anticipate resolution in 10 days, elevate leg ad loose dry gauze for protection to blister

## 2019-03-24 NOTE — Assessment & Plan Note (Signed)
currently anticoagulated, great need for fall prevention

## 2019-03-27 DIAGNOSIS — Z86718 Personal history of other venous thrombosis and embolism: Secondary | ICD-10-CM | POA: Diagnosis not present

## 2019-03-27 DIAGNOSIS — I10 Essential (primary) hypertension: Secondary | ICD-10-CM | POA: Diagnosis not present

## 2019-03-27 DIAGNOSIS — M1711 Unilateral primary osteoarthritis, right knee: Secondary | ICD-10-CM | POA: Diagnosis not present

## 2019-03-27 DIAGNOSIS — Z9181 History of falling: Secondary | ICD-10-CM | POA: Diagnosis not present

## 2019-03-27 DIAGNOSIS — Z7901 Long term (current) use of anticoagulants: Secondary | ICD-10-CM | POA: Diagnosis not present

## 2019-03-27 DIAGNOSIS — M48062 Spinal stenosis, lumbar region with neurogenic claudication: Secondary | ICD-10-CM | POA: Diagnosis not present

## 2019-03-27 DIAGNOSIS — I890 Lymphedema, not elsewhere classified: Secondary | ICD-10-CM | POA: Diagnosis not present

## 2019-03-30 DIAGNOSIS — M48062 Spinal stenosis, lumbar region with neurogenic claudication: Secondary | ICD-10-CM | POA: Diagnosis not present

## 2019-03-30 DIAGNOSIS — M1711 Unilateral primary osteoarthritis, right knee: Secondary | ICD-10-CM | POA: Diagnosis not present

## 2019-03-30 DIAGNOSIS — Z7901 Long term (current) use of anticoagulants: Secondary | ICD-10-CM | POA: Diagnosis not present

## 2019-03-30 DIAGNOSIS — Z86718 Personal history of other venous thrombosis and embolism: Secondary | ICD-10-CM | POA: Diagnosis not present

## 2019-03-30 DIAGNOSIS — Z9181 History of falling: Secondary | ICD-10-CM | POA: Diagnosis not present

## 2019-03-30 DIAGNOSIS — I10 Essential (primary) hypertension: Secondary | ICD-10-CM | POA: Diagnosis not present

## 2019-03-30 DIAGNOSIS — I890 Lymphedema, not elsewhere classified: Secondary | ICD-10-CM | POA: Diagnosis not present

## 2019-04-03 ENCOUNTER — Ambulatory Visit (INDEPENDENT_AMBULATORY_CARE_PROVIDER_SITE_OTHER): Payer: Medicare HMO | Admitting: Family Medicine

## 2019-04-03 ENCOUNTER — Other Ambulatory Visit: Payer: Self-pay

## 2019-04-03 ENCOUNTER — Encounter: Payer: Self-pay | Admitting: Family Medicine

## 2019-04-03 VITALS — BP 126/70 | HR 89 | Temp 98.5°F | Resp 15 | Ht 64.0 in | Wt 149.1 lb

## 2019-04-03 DIAGNOSIS — I872 Venous insufficiency (chronic) (peripheral): Secondary | ICD-10-CM | POA: Diagnosis not present

## 2019-04-03 DIAGNOSIS — Z9181 History of falling: Secondary | ICD-10-CM | POA: Diagnosis not present

## 2019-04-03 DIAGNOSIS — I10 Essential (primary) hypertension: Secondary | ICD-10-CM

## 2019-04-03 DIAGNOSIS — R600 Localized edema: Secondary | ICD-10-CM

## 2019-04-03 DIAGNOSIS — I83009 Varicose veins of unspecified lower extremity with ulcer of unspecified site: Secondary | ICD-10-CM | POA: Insufficient documentation

## 2019-04-03 DIAGNOSIS — Z7901 Long term (current) use of anticoagulants: Secondary | ICD-10-CM | POA: Diagnosis not present

## 2019-04-03 DIAGNOSIS — L97909 Non-pressure chronic ulcer of unspecified part of unspecified lower leg with unspecified severity: Secondary | ICD-10-CM | POA: Insufficient documentation

## 2019-04-03 DIAGNOSIS — M48062 Spinal stenosis, lumbar region with neurogenic claudication: Secondary | ICD-10-CM | POA: Diagnosis not present

## 2019-04-03 DIAGNOSIS — M1711 Unilateral primary osteoarthritis, right knee: Secondary | ICD-10-CM | POA: Diagnosis not present

## 2019-04-03 DIAGNOSIS — Z86718 Personal history of other venous thrombosis and embolism: Secondary | ICD-10-CM | POA: Diagnosis not present

## 2019-04-03 DIAGNOSIS — L97825 Non-pressure chronic ulcer of other part of left lower leg with muscle involvement without evidence of necrosis: Secondary | ICD-10-CM | POA: Diagnosis not present

## 2019-04-03 DIAGNOSIS — I890 Lymphedema, not elsewhere classified: Secondary | ICD-10-CM | POA: Diagnosis not present

## 2019-04-03 MED ORDER — DOXYCYCLINE HYCLATE 100 MG PO TABS: 100.0000 mg | ORAL_TABLET | Freq: Two times a day (BID) | ORAL | 0 refills | Status: DC

## 2019-04-03 NOTE — Patient Instructions (Addendum)
Please keep February appointment as before, call if you need me sooner  You are referred for in home wound care twice weekly for 6 weeks  You are referred to vascular surgeon, Dr Donnetta Hutching as soon as possible in his [place] office  1 week of doxycycline is prescribed  Careful not to fall

## 2019-04-05 DIAGNOSIS — M48062 Spinal stenosis, lumbar region with neurogenic claudication: Secondary | ICD-10-CM | POA: Diagnosis not present

## 2019-04-05 DIAGNOSIS — I890 Lymphedema, not elsewhere classified: Secondary | ICD-10-CM | POA: Diagnosis not present

## 2019-04-05 DIAGNOSIS — Z86718 Personal history of other venous thrombosis and embolism: Secondary | ICD-10-CM

## 2019-04-05 DIAGNOSIS — I10 Essential (primary) hypertension: Secondary | ICD-10-CM | POA: Diagnosis not present

## 2019-04-05 DIAGNOSIS — Z7901 Long term (current) use of anticoagulants: Secondary | ICD-10-CM

## 2019-04-05 DIAGNOSIS — Z9181 History of falling: Secondary | ICD-10-CM

## 2019-04-05 DIAGNOSIS — M1711 Unilateral primary osteoarthritis, right knee: Secondary | ICD-10-CM | POA: Diagnosis not present

## 2019-04-10 ENCOUNTER — Telehealth: Payer: Self-pay

## 2019-04-10 ENCOUNTER — Ambulatory Visit (HOSPITAL_COMMUNITY)
Admission: RE | Admit: 2019-04-10 | Discharge: 2019-04-10 | Disposition: A | Discharge status: Home / Self Care | Payer: Medicare HMO | Source: Ambulatory Visit | Attending: Surgery | Admitting: Surgery

## 2019-04-10 ENCOUNTER — Other Ambulatory Visit: Payer: Self-pay

## 2019-04-10 ENCOUNTER — Other Ambulatory Visit: Payer: Self-pay | Admitting: Surgery

## 2019-04-10 ENCOUNTER — Encounter: Payer: Self-pay | Admitting: Family Medicine

## 2019-04-10 ENCOUNTER — Encounter: Payer: Self-pay | Admitting: Surgery

## 2019-04-10 ENCOUNTER — Ambulatory Visit (INDEPENDENT_AMBULATORY_CARE_PROVIDER_SITE_OTHER): Payer: Medicare HMO | Admitting: Surgery

## 2019-04-10 VITALS — BP 130/84 | HR 100 | Temp 97.3°F | Resp 20 | Ht 64.0 in | Wt 144.9 lb

## 2019-04-10 DIAGNOSIS — Z9181 History of falling: Secondary | ICD-10-CM | POA: Diagnosis not present

## 2019-04-10 DIAGNOSIS — M7989 Other specified soft tissue disorders: Secondary | ICD-10-CM

## 2019-04-10 DIAGNOSIS — I82402 Acute embolism and thrombosis of unspecified deep veins of left lower extremity: Secondary | ICD-10-CM | POA: Diagnosis not present

## 2019-04-10 DIAGNOSIS — I890 Lymphedema, not elsewhere classified: Secondary | ICD-10-CM | POA: Diagnosis not present

## 2019-04-10 DIAGNOSIS — L97825 Non-pressure chronic ulcer of other part of left lower leg with muscle involvement without evidence of necrosis: Secondary | ICD-10-CM

## 2019-04-10 DIAGNOSIS — I872 Venous insufficiency (chronic) (peripheral): Secondary | ICD-10-CM

## 2019-04-10 DIAGNOSIS — M48062 Spinal stenosis, lumbar region with neurogenic claudication: Secondary | ICD-10-CM | POA: Diagnosis not present

## 2019-04-10 DIAGNOSIS — Z7901 Long term (current) use of anticoagulants: Secondary | ICD-10-CM | POA: Diagnosis not present

## 2019-04-10 DIAGNOSIS — M1711 Unilateral primary osteoarthritis, right knee: Secondary | ICD-10-CM | POA: Diagnosis not present

## 2019-04-10 DIAGNOSIS — I10 Essential (primary) hypertension: Secondary | ICD-10-CM | POA: Diagnosis not present

## 2019-04-10 DIAGNOSIS — Z86718 Personal history of other venous thrombosis and embolism: Secondary | ICD-10-CM | POA: Diagnosis not present

## 2019-04-10 NOTE — Progress Notes (Signed)
Vascular and Vein Specialist of [place]  Patient name: [name] [name] MRN: [id number] DOB: Jun 07, 1932 Sex: female   REQUESTING PROVIDER:    Dr. Moshe Cipro   REASON FOR CONSULT:    Leg blistering and swelling  HISTORY OF PRESENT ILLNESS:   [name] [name] is a 86 y.o. female, who is referred for evaluation of leg swelling and blistering.  She has been on anticoagulation since 02/05/2019 when she had a extensive left leg DVT involving the proximal left femoral through the left popliteal vein.  Her blistering developed approximately 2 weeks ago and has not improved.  She has been on a 7-day course of antibiotics.  The patient is medically managed for hypercholesterolemia and hypertension.  She does have spinal stenosis which limits her activity.  PAST MEDICAL HISTORY    Past Medical History:  Diagnosis Date  . Arthritis    spinal stenosis  . Elevated LFTs    secondary to fatty liver, negative work-up in 2011  . Hypercholesterolemia   . Hypertension   . Lumbar spinal stenosis 01/14/2018   L3-4 and L4-5  . Myopathy 02/15/2018  . Osteoarthritis    right knee  . Peripheral neuropathy 07/06/2018  . PONV (postoperative nausea and vomiting)   . Uterine cancer ([place]) 08/2006   grade 1, no recurrence up to 2013     FAMILY HISTORY   Family History  Problem Relation Age of Onset  . Heart disease Father   . Bladder Cancer Brother        in remission   . Hypertension Sister   . Dementia Sister   . Diabetes type II Sister   . Hypertension Sister   . Aneurysm Sister   . Hypertension Brother   . Arthritis Brother   . Hypertension Son   . Colon cancer Neg Hx     SOCIAL HISTORY:   Social History   Socioeconomic History  . Marital status: Married    Spouse name: Jeneen Rinks   . Number of children: 3  . Years of education: 95  . Highest education level: 12th grade  Occupational History  . Occupation: retired   Tobacco Use  . Smoking status:  Never Smoker  . Smokeless tobacco: Never Used  . Tobacco comment: Never smoked  Substance and Sexual Activity  . Alcohol use: No  . Drug use: No  . Sexual activity: Not Currently    Birth control/protection: Post-menopausal, Surgical  Other Topics Concern  . Not on file  Social History Narrative   Husband Jeneen Rinks recently passed away   Caffeine use: soda daily   Right handed    Social Determinants of Health   Financial Resource Strain:   . Difficulty of Paying Living Expenses: Not on file  Food Insecurity:   . Worried About Charity fundraiser in the Last Year: Not on file  . Ran Out of Food in the Last Year: Not on file  Transportation Needs:   . Lack of Transportation (Medical): Not on file  . Lack of Transportation (Non-Medical): Not on file  Physical Activity:   . Days of Exercise per Week: Not on file  . Minutes of Exercise per Session: Not on file  Stress:   . Feeling of Stress : Not on file  Social Connections: Unknown  . Frequency of Communication with Friends and Family: Not on file  . Frequency of Social Gatherings with Friends and Family: Not on file  . Attends Religious Services: Not on file  . Active Member  of Clubs or Organizations: No  . Attends Archivist Meetings: Not on file  . Marital Status: Widowed  Intimate Partner Violence:   . Fear of Current or Ex-Partner: Not on file  . Emotionally Abused: Not on file  . Physically Abused: Not on file  . Sexually Abused: Not on file    ALLERGIES:    Allergies  Allergen Reactions  . Cellcept [Mycophenolate Mofetil]     Muscle cramps, mouth soreness, difficulty swallowing, bruising  . Statins     Dx of in  . Ciprofloxacin Hcl Other (See Comments)    Chills, sick, could not tolerate it  . Hydrocodone Nausea Only  . Penicillins Rash    CURRENT MEDICATIONS:    Current Outpatient Medications  Medication Sig Dispense Refill  . apixaban (ELIQUIS) 5 MG TABS tablet Take 1 tablet (5 mg total) by  mouth 2 (two) times daily. 180 tablet 0  . Chromium-Cinnamon [phone] MCG-MG CAPS Take 2 tablets by mouth daily.    Mariane Baumgarten Calcium (STOOL SOFTENER PO) Take by mouth daily as needed.    . doxycycline (VIBRA-TABS) 100 MG tablet Take 1 tablet (100 mg total) by mouth 2 (two) times daily. 14 tablet 0  . ezetimibe (ZETIA) 10 MG tablet Take 1 tablet (10 mg total) by mouth daily. 90 tablet 3  . folic acid (FOLVITE) 1 MG tablet Take 1 tablet (1 mg total) by mouth daily. 90 tablet 3  . furosemide (LASIX) 20 MG tablet Take one tablet by mouth once daily for 5 days, then , as once daily as needed, for leg swelling 20 tablet 0  . gabapentin (NEURONTIN) 100 MG capsule Take 1 capsule (100 mg total) by mouth at bedtime. 90 capsule 3  . LINZESS 145 MCG CAPS capsule     . predniSONE (DELTASONE) 1 MG tablet Begin taking 4 tablets daily, taper by 1 tablet every 6 weeks until off 120 tablet 3  . predniSONE (DELTASONE) 5 MG tablet Take 1 tablet (5 mg total) by mouth daily with breakfast. 90 tablet 2  . triamterene-hydrochlorothiazide (MAXZIDE) 75-50 MG tablet TAKE 1 TABLET EVERY DAY 90 tablet 1  . UNABLE TO FIND Lift chair x 1  DX M48.06, M53.87 1 each 0   No current facility-administered medications for this visit.    REVIEW OF SYSTEMS:   [X]  denotes positive finding, [ ]  denotes negative finding Cardiac  Comments:  Chest pain or chest pressure:    Shortness of breath upon exertion:    Short of breath when lying flat:    Irregular heart rhythm:        Vascular    Pain in calf, thigh, or hip brought on by ambulation:    Pain in feet at night that wakes you up from your sleep:     Blood clot in your veins:    Leg swelling:  x       Pulmonary    Oxygen at home:    Productive cough:     Wheezing:         Neurologic    Sudden weakness in arms or legs:     Sudden numbness in arms or legs:     Sudden onset of difficulty speaking or slurred speech:    Temporary loss of vision in one eye:       Problems with dizziness:         Gastrointestinal    Blood in stool:      Vomited blood:  Genitourinary    Burning when urinating:     Blood in urine:        Psychiatric    Major depression:         Hematologic    Bleeding problems:    Problems with blood clotting too easily:        Skin    Rashes or ulcers:        Constitutional    Fever or chills:     PHYSICAL EXAM:   Vitals:   04/10/19 1433  BP: 130/84  Pulse: 100  Resp: 20  Temp: (!) 97.3 F (36.3 C)  SpO2: 96%  Weight: 144 lb 14.4 oz (65.7 kg)  Height: 5' 4"  (1.626 m)    GENERAL: The patient is a well-nourished female, in no acute distress. The vital signs are documented above. CARDIAC: There is a regular rate and rhythm.  VASCULAR: Brisk pedal Doppler signals.  2+ pitting edema left leg with superficial blistering and ulceration PULMONARY: Nonlabored respirations ABDOMEN: Soft and non-tender with normal pitched bowel sounds.  MUSCULOSKELETAL: There are no major deformities or cyanosis. NEUROLOGIC: No focal weakness or paresthesias are detected. SKIN: There are no ulcers or rashes noted. PSYCHIATRIC: The patient has a normal affect.  STUDIES:   I have reviewed the following:  Left venous reflux exam;  Left: Findings consistent with chronic deep vein thrombosis involving the left femoral vein and left popliteal vein. Findings consistent with chronic superficial vein thrombosis involving the left small saphenous vein. The saphenofemoral juntion is not competent. The great saphenous vein is competent. The deep venous system is not competent. Baker's cyst visualized.     ASSESSMENT and PLAN   Left leg DVT: The patient has developed blistering and superficial ulceration on the left leg along with significant swelling.  She is just now completing a course of antibiotics.  We discussed leg elevation and the need for external compression to help heal her wounds.  I think she is a good candidate for  a Haematologist.  I am going to place this in the office today and then arrange home health to have this change 3 times a week.  I will plan on having her follow-up with me in the office in 3 weeks for wound check.  If she has healed her wounds, she will need to be in compression stockings until her edema resolves.   Leia Alf, MD, FACS [facility] of [facility] [phone] Pager [phone]

## 2019-04-10 NOTE — Telephone Encounter (Signed)
[facility]

## 2019-04-10 NOTE — Progress Notes (Signed)
   [name] [name]     MRN: [id number]      DOB: Mar 10, 1933   HPI [name] is here for follow up and re-evaluation of lower extremity swelling and blisters , now with skin breakdown, skin breakdown has worsened since visit  2 weeks ago despite antibiotic treatment. Will benefit and needs wound care as well as vascular consult Pt is housebounfd due to severe spine disease resulting in lower extremity weakness and high fall risk  ROS Denies recent fever or chills. Denies sinus pressure, nasal congestion, ear pain or sore throat. Denies chest congestion, productive cough or wheezing. Denies chest pains, palpitations and leg swelling Denies abdominal pain, nausea, vomiting,diaDenies joint pain, swelling and limitation in mobility. Denies headaches, seizures, numbness, or tingling. Denies depression, anxiety or insomnia.    PE  BP 126/70   Pulse 89   Temp 98.5 F (36.9 C) (Oral)   Resp 15   Ht 5' 4"  (1.626 m)   Wt 149 lb 1.9 oz (67.6 kg)   SpO2 93%   BMI 25.60 kg/m   Patient alert and oriented and in no cardiopulmonary distress.  HEENT: No facial asymmetry, EOMI,     Neck supple .  Chest: Clear to auscultation bilaterally.  CVS: S1, S2 no murmurs, no S3.Regular rate.  ABD: Soft non tender.   Ext: bilateral lower extremity  edema  MS: markedly decreased  ROM spine, stage 1 ulcers x 2 on RLE , no purulent drainage noted Psych: Good eye contact, normal affect. Memory intact not anxious or depressed appearing.  CNS: CN 2-12 intact, power,  normal throughout.no focal deficits noted.   Assessment & Plan  Venous stasis ulcers (HCC) Worsened despite oral antibiotics. Additional 1 week course of doxycycline and in home wound care ordered x 6 weeks. Pt homebound due to severe arthritis  Lower leg edema Bilateral lE edema, worsening , Vascular surgery to eval and manage  Essential hypertension Controlled, no change in medication   At high risk for falls per [facility] fall risk  assessment scale Home safety and fall risk reduction discussed

## 2019-04-10 NOTE — Assessment & Plan Note (Signed)
Bilateral lE edema, worsening , Vascular surgery to eval and manage

## 2019-04-10 NOTE — Assessment & Plan Note (Signed)
Controlled, no change in medication  

## 2019-04-10 NOTE — Assessment & Plan Note (Signed)
Worsened despite oral antibiotics. Additional 1 week course of doxycycline and in home wound care ordered x 6 weeks. Pt homebound due to severe arthritis

## 2019-04-10 NOTE — Assessment & Plan Note (Signed)
Home safety and fall risk reduction discussed

## 2019-04-12 ENCOUNTER — Telehealth: Payer: Self-pay

## 2019-04-12 DIAGNOSIS — M48062 Spinal stenosis, lumbar region with neurogenic claudication: Secondary | ICD-10-CM | POA: Diagnosis not present

## 2019-04-12 DIAGNOSIS — Z9181 History of falling: Secondary | ICD-10-CM | POA: Diagnosis not present

## 2019-04-12 DIAGNOSIS — I890 Lymphedema, not elsewhere classified: Secondary | ICD-10-CM | POA: Diagnosis not present

## 2019-04-12 DIAGNOSIS — M1711 Unilateral primary osteoarthritis, right knee: Secondary | ICD-10-CM | POA: Diagnosis not present

## 2019-04-12 DIAGNOSIS — Z86718 Personal history of other venous thrombosis and embolism: Secondary | ICD-10-CM | POA: Diagnosis not present

## 2019-04-12 DIAGNOSIS — Z7901 Long term (current) use of anticoagulants: Secondary | ICD-10-CM | POA: Diagnosis not present

## 2019-04-12 DIAGNOSIS — I10 Essential (primary) hypertension: Secondary | ICD-10-CM | POA: Diagnosis not present

## 2019-04-12 NOTE — Telephone Encounter (Signed)
I called [facility] and spoke to [place].  She will add orders for an [employer])  to the Left leg to be changed 3 times per wk.  Thurston Hole., LPN

## 2019-04-13 DIAGNOSIS — M48062 Spinal stenosis, lumbar region with neurogenic claudication: Secondary | ICD-10-CM | POA: Diagnosis not present

## 2019-04-13 DIAGNOSIS — Z9181 History of falling: Secondary | ICD-10-CM | POA: Diagnosis not present

## 2019-04-13 DIAGNOSIS — Z7901 Long term (current) use of anticoagulants: Secondary | ICD-10-CM | POA: Diagnosis not present

## 2019-04-13 DIAGNOSIS — I10 Essential (primary) hypertension: Secondary | ICD-10-CM | POA: Diagnosis not present

## 2019-04-13 DIAGNOSIS — Z86718 Personal history of other venous thrombosis and embolism: Secondary | ICD-10-CM | POA: Diagnosis not present

## 2019-04-13 DIAGNOSIS — M1711 Unilateral primary osteoarthritis, right knee: Secondary | ICD-10-CM | POA: Diagnosis not present

## 2019-04-13 DIAGNOSIS — I890 Lymphedema, not elsewhere classified: Secondary | ICD-10-CM | POA: Diagnosis not present

## 2019-04-18 DIAGNOSIS — M48062 Spinal stenosis, lumbar region with neurogenic claudication: Secondary | ICD-10-CM | POA: Diagnosis not present

## 2019-04-18 DIAGNOSIS — I10 Essential (primary) hypertension: Secondary | ICD-10-CM | POA: Diagnosis not present

## 2019-04-18 DIAGNOSIS — Z7901 Long term (current) use of anticoagulants: Secondary | ICD-10-CM | POA: Diagnosis not present

## 2019-04-18 DIAGNOSIS — Z86718 Personal history of other venous thrombosis and embolism: Secondary | ICD-10-CM | POA: Diagnosis not present

## 2019-04-18 DIAGNOSIS — I890 Lymphedema, not elsewhere classified: Secondary | ICD-10-CM | POA: Diagnosis not present

## 2019-04-18 DIAGNOSIS — Z9181 History of falling: Secondary | ICD-10-CM | POA: Diagnosis not present

## 2019-04-18 DIAGNOSIS — M1711 Unilateral primary osteoarthritis, right knee: Secondary | ICD-10-CM | POA: Diagnosis not present

## 2019-04-20 DIAGNOSIS — M48062 Spinal stenosis, lumbar region with neurogenic claudication: Secondary | ICD-10-CM | POA: Diagnosis not present

## 2019-04-20 DIAGNOSIS — M1711 Unilateral primary osteoarthritis, right knee: Secondary | ICD-10-CM | POA: Diagnosis not present

## 2019-04-20 DIAGNOSIS — Z7901 Long term (current) use of anticoagulants: Secondary | ICD-10-CM | POA: Diagnosis not present

## 2019-04-20 DIAGNOSIS — Z86718 Personal history of other venous thrombosis and embolism: Secondary | ICD-10-CM | POA: Diagnosis not present

## 2019-04-20 DIAGNOSIS — I10 Essential (primary) hypertension: Secondary | ICD-10-CM | POA: Diagnosis not present

## 2019-04-20 DIAGNOSIS — Z9181 History of falling: Secondary | ICD-10-CM | POA: Diagnosis not present

## 2019-04-20 DIAGNOSIS — I890 Lymphedema, not elsewhere classified: Secondary | ICD-10-CM | POA: Diagnosis not present

## 2019-04-21 DIAGNOSIS — Z7901 Long term (current) use of anticoagulants: Secondary | ICD-10-CM | POA: Diagnosis not present

## 2019-04-21 DIAGNOSIS — Z86718 Personal history of other venous thrombosis and embolism: Secondary | ICD-10-CM | POA: Diagnosis not present

## 2019-04-21 DIAGNOSIS — M48062 Spinal stenosis, lumbar region with neurogenic claudication: Secondary | ICD-10-CM | POA: Diagnosis not present

## 2019-04-21 DIAGNOSIS — Z9181 History of falling: Secondary | ICD-10-CM | POA: Diagnosis not present

## 2019-04-21 DIAGNOSIS — M1711 Unilateral primary osteoarthritis, right knee: Secondary | ICD-10-CM | POA: Diagnosis not present

## 2019-04-21 DIAGNOSIS — I10 Essential (primary) hypertension: Secondary | ICD-10-CM | POA: Diagnosis not present

## 2019-04-21 DIAGNOSIS — I890 Lymphedema, not elsewhere classified: Secondary | ICD-10-CM | POA: Diagnosis not present

## 2019-04-22 DIAGNOSIS — Z7901 Long term (current) use of anticoagulants: Secondary | ICD-10-CM | POA: Diagnosis not present

## 2019-04-22 DIAGNOSIS — Z9181 History of falling: Secondary | ICD-10-CM | POA: Diagnosis not present

## 2019-04-22 DIAGNOSIS — M1711 Unilateral primary osteoarthritis, right knee: Secondary | ICD-10-CM | POA: Diagnosis not present

## 2019-04-22 DIAGNOSIS — M48062 Spinal stenosis, lumbar region with neurogenic claudication: Secondary | ICD-10-CM | POA: Diagnosis not present

## 2019-04-22 DIAGNOSIS — I10 Essential (primary) hypertension: Secondary | ICD-10-CM | POA: Diagnosis not present

## 2019-04-22 DIAGNOSIS — Z86718 Personal history of other venous thrombosis and embolism: Secondary | ICD-10-CM | POA: Diagnosis not present

## 2019-04-22 DIAGNOSIS — I890 Lymphedema, not elsewhere classified: Secondary | ICD-10-CM | POA: Diagnosis not present

## 2019-04-24 DIAGNOSIS — M48062 Spinal stenosis, lumbar region with neurogenic claudication: Secondary | ICD-10-CM | POA: Diagnosis not present

## 2019-04-24 DIAGNOSIS — Z86718 Personal history of other venous thrombosis and embolism: Secondary | ICD-10-CM | POA: Diagnosis not present

## 2019-04-24 DIAGNOSIS — Z9181 History of falling: Secondary | ICD-10-CM | POA: Diagnosis not present

## 2019-04-24 DIAGNOSIS — I890 Lymphedema, not elsewhere classified: Secondary | ICD-10-CM | POA: Diagnosis not present

## 2019-04-24 DIAGNOSIS — Z7901 Long term (current) use of anticoagulants: Secondary | ICD-10-CM | POA: Diagnosis not present

## 2019-04-24 DIAGNOSIS — M1711 Unilateral primary osteoarthritis, right knee: Secondary | ICD-10-CM | POA: Diagnosis not present

## 2019-04-24 DIAGNOSIS — I10 Essential (primary) hypertension: Secondary | ICD-10-CM | POA: Diagnosis not present

## 2019-04-26 DIAGNOSIS — I890 Lymphedema, not elsewhere classified: Secondary | ICD-10-CM | POA: Diagnosis not present

## 2019-04-26 DIAGNOSIS — Z7901 Long term (current) use of anticoagulants: Secondary | ICD-10-CM | POA: Diagnosis not present

## 2019-04-26 DIAGNOSIS — Z9181 History of falling: Secondary | ICD-10-CM | POA: Diagnosis not present

## 2019-04-26 DIAGNOSIS — I10 Essential (primary) hypertension: Secondary | ICD-10-CM | POA: Diagnosis not present

## 2019-04-26 DIAGNOSIS — M1711 Unilateral primary osteoarthritis, right knee: Secondary | ICD-10-CM | POA: Diagnosis not present

## 2019-04-26 DIAGNOSIS — M48062 Spinal stenosis, lumbar region with neurogenic claudication: Secondary | ICD-10-CM | POA: Diagnosis not present

## 2019-04-26 DIAGNOSIS — Z86718 Personal history of other venous thrombosis and embolism: Secondary | ICD-10-CM | POA: Diagnosis not present

## 2019-04-28 DIAGNOSIS — M48062 Spinal stenosis, lumbar region with neurogenic claudication: Secondary | ICD-10-CM | POA: Diagnosis not present

## 2019-04-28 DIAGNOSIS — I10 Essential (primary) hypertension: Secondary | ICD-10-CM | POA: Diagnosis not present

## 2019-04-28 DIAGNOSIS — Z9181 History of falling: Secondary | ICD-10-CM | POA: Diagnosis not present

## 2019-04-28 DIAGNOSIS — I890 Lymphedema, not elsewhere classified: Secondary | ICD-10-CM | POA: Diagnosis not present

## 2019-04-28 DIAGNOSIS — Z7901 Long term (current) use of anticoagulants: Secondary | ICD-10-CM | POA: Diagnosis not present

## 2019-04-28 DIAGNOSIS — Z86718 Personal history of other venous thrombosis and embolism: Secondary | ICD-10-CM | POA: Diagnosis not present

## 2019-04-28 DIAGNOSIS — M1711 Unilateral primary osteoarthritis, right knee: Secondary | ICD-10-CM | POA: Diagnosis not present

## 2019-05-01 ENCOUNTER — Encounter: Payer: Self-pay | Admitting: Surgery

## 2019-05-01 ENCOUNTER — Ambulatory Visit: Payer: Medicare HMO | Admitting: Surgery

## 2019-05-01 ENCOUNTER — Other Ambulatory Visit: Payer: Self-pay

## 2019-05-01 VITALS — BP 140/73 | HR 79 | Temp 97.6°F | Resp 20 | Ht 64.0 in | Wt 147.0 lb

## 2019-05-01 DIAGNOSIS — I82402 Acute embolism and thrombosis of unspecified deep veins of left lower extremity: Secondary | ICD-10-CM

## 2019-05-01 MED ORDER — SULFAMETHOXAZOLE-TRIMETHOPRIM 400-80 MG PO TABS: 1.0000 | ORAL_TABLET | Freq: Two times a day (BID) | ORAL | 0 refills | Status: DC

## 2019-05-01 NOTE — Progress Notes (Signed)
Vascular and Vein Specialist of   Patient name: [name] [name] MRN: [id number] DOB: 03-24-1932 Sex: female   REASON FOR VISIT:    Follow up  HISOTRY OF PRESENT ILLNESS:    [name] [name] is a 86 y.o. female, who I saw 3 weeks ago for evaluation of leg swelling and blistering.  She has been on anticoagulation since 02/05/2019 when she had a extensive left leg DVT involving the proximal left femoral through the left popliteal vein.  Her blistering developed approximately 2 weeks ago and has not improved.  She has been on a 7-day course of antibiotics.  Ultrasound in our office showed chronic DVT in the left femoral and popliteal veins.  Because of her superficial ulceration and significant swelling I placed her in a Unna boot.  She is back today for follow-up.  The patient is medically managed for hypercholesterolemia and hypertension.  She does have spinal stenosis which limits her activity.  PAST MEDICAL HISTORY:   Past Medical History:  Diagnosis Date  . Arthritis    spinal stenosis  . Elevated LFTs    secondary to fatty liver, negative work-up in 2011  . Hypercholesterolemia   . Hypertension   . Lumbar spinal stenosis 01/14/2018   L3-4 and L4-5  . Myopathy 02/15/2018  . Osteoarthritis    right knee  . Peripheral neuropathy 07/06/2018  . PONV (postoperative nausea and vomiting)   . Uterine cancer ([place]) 08/2006   grade 1, no recurrence up to 2013     FAMILY HISTORY:   Family History  Problem Relation Age of Onset  . Heart disease Father   . Bladder Cancer Brother        in remission   . Hypertension Sister   . Dementia Sister   . Diabetes type II Sister   . Hypertension Sister   . Aneurysm Sister   . Hypertension Brother   . Arthritis Brother   . Hypertension Son   . Colon cancer Neg Hx     SOCIAL HISTORY:   Social History   Tobacco Use  . Smoking status: Never Smoker  . Smokeless tobacco: Never Used  . Tobacco  comment: Never smoked  Substance Use Topics  . Alcohol use: No     ALLERGIES:   Allergies  Allergen Reactions  . Cellcept [Mycophenolate Mofetil]     Muscle cramps, mouth soreness, difficulty swallowing, bruising  . Statins     Dx of in  . Ciprofloxacin Hcl Other (See Comments)    Chills, sick, could not tolerate it  . Hydrocodone Nausea Only  . Penicillins Rash     CURRENT MEDICATIONS:   Current Outpatient Medications  Medication Sig Dispense Refill  . apixaban (ELIQUIS) 5 MG TABS tablet Take 1 tablet (5 mg total) by mouth 2 (two) times daily. 180 tablet 0  . Chromium-Cinnamon [phone] MCG-MG CAPS Take 2 tablets by mouth daily.    [name] [name] Calcium (STOOL SOFTENER PO) Take by mouth daily as needed.    . doxycycline (VIBRA-TABS) 100 MG tablet Take 1 tablet (100 mg total) by mouth 2 (two) times daily. 14 tablet 0  . ezetimibe (ZETIA) 10 MG tablet Take 1 tablet (10 mg total) by mouth daily. 90 tablet 3  . folic acid (FOLVITE) 1 MG tablet Take 1 tablet (1 mg total) by mouth daily. 90 tablet 3  . furosemide (LASIX) 20 MG tablet Take one tablet by mouth once daily for 5 days, then , as once daily as needed,  for leg swelling 20 tablet 0  . gabapentin (NEURONTIN) 100 MG capsule Take 1 capsule (100 mg total) by mouth at bedtime. 90 capsule 3  . LINZESS 145 MCG CAPS capsule     . predniSONE (DELTASONE) 1 MG tablet Begin taking 4 tablets daily, taper by 1 tablet every 6 weeks until off 120 tablet 3  . predniSONE (DELTASONE) 5 MG tablet Take 1 tablet (5 mg total) by mouth daily with breakfast. 90 tablet 2  . triamterene-hydrochlorothiazide (MAXZIDE) 75-50 MG tablet TAKE 1 TABLET EVERY DAY 90 tablet 1  . UNABLE TO FIND Lift chair x 1  DX M48.06, M53.87 1 each 0   No current facility-administered medications for this visit.    REVIEW OF SYSTEMS:   [X]  denotes positive finding, [ ]  denotes negative finding Cardiac  Comments:  Chest pain or chest pressure:    Shortness of breath  upon exertion:    Short of breath when lying flat:    Irregular heart rhythm:        Vascular    Pain in calf, thigh, or hip brought on by ambulation:    Pain in feet at night that wakes you up from your sleep:     Blood clot in your veins:    Leg swelling:  x       Pulmonary    Oxygen at home:    Productive cough:     Wheezing:         Neurologic    Sudden weakness in arms or legs:     Sudden numbness in arms or legs:     Sudden onset of difficulty speaking or slurred speech:    Temporary loss of vision in one eye:     Problems with dizziness:         Gastrointestinal    Blood in stool:     Vomited blood:         Genitourinary    Burning when urinating:     Blood in urine:        Psychiatric    Major depression:         Hematologic    Bleeding problems:    Problems with blood clotting too easily:        Skin    Rashes or ulcers:        Constitutional    Fever or chills:      PHYSICAL EXAM:   Vitals:   05/01/19 1058  BP: 140/73  Pulse: 79  Resp: 20  Temp: 97.6 F (36.4 C)  SpO2: 99%  Weight: 147 lb (66.7 kg)  Height: 5' 4"  (1.626 m)    GENERAL: The patient is a well-nourished female, in no acute distress. The vital signs are documented above. CARDIAC: There is a regular rate and rhythm.  VASCULAR: There is a 2 x 2 millimeter new ulcer on the anterior aspect of the right leg.  The left leg has nearly resolved although there is some erythema around the ankle.  She still has bilateral edema. PULMONARY: Non-labored respirations ABDOMEN: Soft and non-tender with normal pitched bowel sounds.  MUSCULOSKELETAL: There are no major deformities or cyanosis. NEUROLOGIC: No focal weakness or paresthesias are detected. PSYCHIATRIC: The patient has a normal affect.  STUDIES:   None MEDICAL ISSUES:   Left leg DVT with ulcer: The patient was placed in a Unna boot several weeks ago.  She has had good improvement in her wounds which are nearly resolved.  There is  a small amount of erythema around her ankle.  I am unsure whether this is related to the inability or whether this is residual cellulitis.  I am starting her on Bactrim for 10 days.  She will continue her Haematologist.  I am also placing a Unna boot on her right leg given the new appearance of an ulcer.  When she returns in 3 weeks I will check her ABIs to make sure that there is not a arterial issue as well.    Leia Alf, MD, FACS [facility] of [facility] [phone] Pager [phone]

## 2019-05-03 ENCOUNTER — Telehealth: Payer: Self-pay

## 2019-05-03 DIAGNOSIS — I10 Essential (primary) hypertension: Secondary | ICD-10-CM | POA: Diagnosis not present

## 2019-05-03 DIAGNOSIS — Z9181 History of falling: Secondary | ICD-10-CM | POA: Diagnosis not present

## 2019-05-03 DIAGNOSIS — M1711 Unilateral primary osteoarthritis, right knee: Secondary | ICD-10-CM | POA: Diagnosis not present

## 2019-05-03 DIAGNOSIS — M48062 Spinal stenosis, lumbar region with neurogenic claudication: Secondary | ICD-10-CM | POA: Diagnosis not present

## 2019-05-03 DIAGNOSIS — Z7901 Long term (current) use of anticoagulants: Secondary | ICD-10-CM | POA: Diagnosis not present

## 2019-05-03 DIAGNOSIS — Z86718 Personal history of other venous thrombosis and embolism: Secondary | ICD-10-CM | POA: Diagnosis not present

## 2019-05-03 DIAGNOSIS — I890 Lymphedema, not elsewhere classified: Secondary | ICD-10-CM | POA: Diagnosis not present

## 2019-05-03 NOTE — Telephone Encounter (Signed)
I called [facility] and left a voice message for [name] Allan to apply bilateral Retail buyer (Per Dr. Trula Slade).  Will give an order for TID just as she had in the past when Melissa calls back.   Thurston Hole., LPN

## 2019-05-04 DIAGNOSIS — I10 Essential (primary) hypertension: Secondary | ICD-10-CM | POA: Diagnosis not present

## 2019-05-04 DIAGNOSIS — Z7901 Long term (current) use of anticoagulants: Secondary | ICD-10-CM | POA: Diagnosis not present

## 2019-05-04 DIAGNOSIS — I890 Lymphedema, not elsewhere classified: Secondary | ICD-10-CM | POA: Diagnosis not present

## 2019-05-04 DIAGNOSIS — Z86718 Personal history of other venous thrombosis and embolism: Secondary | ICD-10-CM | POA: Diagnosis not present

## 2019-05-04 DIAGNOSIS — Z9181 History of falling: Secondary | ICD-10-CM | POA: Diagnosis not present

## 2019-05-04 DIAGNOSIS — M1711 Unilateral primary osteoarthritis, right knee: Secondary | ICD-10-CM | POA: Diagnosis not present

## 2019-05-04 DIAGNOSIS — M48062 Spinal stenosis, lumbar region with neurogenic claudication: Secondary | ICD-10-CM | POA: Diagnosis not present

## 2019-05-05 DIAGNOSIS — M1711 Unilateral primary osteoarthritis, right knee: Secondary | ICD-10-CM | POA: Diagnosis not present

## 2019-05-05 DIAGNOSIS — Z9181 History of falling: Secondary | ICD-10-CM | POA: Diagnosis not present

## 2019-05-05 DIAGNOSIS — Z86718 Personal history of other venous thrombosis and embolism: Secondary | ICD-10-CM | POA: Diagnosis not present

## 2019-05-05 DIAGNOSIS — I10 Essential (primary) hypertension: Secondary | ICD-10-CM | POA: Diagnosis not present

## 2019-05-05 DIAGNOSIS — M48062 Spinal stenosis, lumbar region with neurogenic claudication: Secondary | ICD-10-CM | POA: Diagnosis not present

## 2019-05-05 DIAGNOSIS — Z7901 Long term (current) use of anticoagulants: Secondary | ICD-10-CM | POA: Diagnosis not present

## 2019-05-05 DIAGNOSIS — I890 Lymphedema, not elsewhere classified: Secondary | ICD-10-CM | POA: Diagnosis not present

## 2019-05-06 ENCOUNTER — Ambulatory Visit: Payer: Medicare HMO

## 2019-05-08 DIAGNOSIS — M48062 Spinal stenosis, lumbar region with neurogenic claudication: Secondary | ICD-10-CM | POA: Diagnosis not present

## 2019-05-08 DIAGNOSIS — Z9181 History of falling: Secondary | ICD-10-CM | POA: Diagnosis not present

## 2019-05-08 DIAGNOSIS — M1711 Unilateral primary osteoarthritis, right knee: Secondary | ICD-10-CM | POA: Diagnosis not present

## 2019-05-08 DIAGNOSIS — I10 Essential (primary) hypertension: Secondary | ICD-10-CM | POA: Diagnosis not present

## 2019-05-08 DIAGNOSIS — Z7901 Long term (current) use of anticoagulants: Secondary | ICD-10-CM | POA: Diagnosis not present

## 2019-05-08 DIAGNOSIS — Z86718 Personal history of other venous thrombosis and embolism: Secondary | ICD-10-CM | POA: Diagnosis not present

## 2019-05-08 DIAGNOSIS — I890 Lymphedema, not elsewhere classified: Secondary | ICD-10-CM | POA: Diagnosis not present

## 2019-05-10 DIAGNOSIS — I10 Essential (primary) hypertension: Secondary | ICD-10-CM | POA: Diagnosis not present

## 2019-05-10 DIAGNOSIS — Z9181 History of falling: Secondary | ICD-10-CM | POA: Diagnosis not present

## 2019-05-10 DIAGNOSIS — M1711 Unilateral primary osteoarthritis, right knee: Secondary | ICD-10-CM | POA: Diagnosis not present

## 2019-05-10 DIAGNOSIS — Z86718 Personal history of other venous thrombosis and embolism: Secondary | ICD-10-CM | POA: Diagnosis not present

## 2019-05-10 DIAGNOSIS — Z7901 Long term (current) use of anticoagulants: Secondary | ICD-10-CM | POA: Diagnosis not present

## 2019-05-10 DIAGNOSIS — I890 Lymphedema, not elsewhere classified: Secondary | ICD-10-CM | POA: Diagnosis not present

## 2019-05-10 DIAGNOSIS — M48062 Spinal stenosis, lumbar region with neurogenic claudication: Secondary | ICD-10-CM | POA: Diagnosis not present

## 2019-05-12 ENCOUNTER — Other Ambulatory Visit: Payer: Self-pay | Admitting: Family Medicine

## 2019-05-12 DIAGNOSIS — I10 Essential (primary) hypertension: Secondary | ICD-10-CM | POA: Diagnosis not present

## 2019-05-12 DIAGNOSIS — Z86718 Personal history of other venous thrombosis and embolism: Secondary | ICD-10-CM | POA: Diagnosis not present

## 2019-05-12 DIAGNOSIS — M48062 Spinal stenosis, lumbar region with neurogenic claudication: Secondary | ICD-10-CM | POA: Diagnosis not present

## 2019-05-12 DIAGNOSIS — I890 Lymphedema, not elsewhere classified: Secondary | ICD-10-CM | POA: Diagnosis not present

## 2019-05-12 DIAGNOSIS — Z9181 History of falling: Secondary | ICD-10-CM | POA: Diagnosis not present

## 2019-05-12 DIAGNOSIS — Z7901 Long term (current) use of anticoagulants: Secondary | ICD-10-CM | POA: Diagnosis not present

## 2019-05-12 DIAGNOSIS — M1711 Unilateral primary osteoarthritis, right knee: Secondary | ICD-10-CM | POA: Diagnosis not present

## 2019-05-15 ENCOUNTER — Encounter: Payer: Medicare HMO | Admitting: Family Medicine

## 2019-05-15 ENCOUNTER — Ambulatory Visit (INDEPENDENT_AMBULATORY_CARE_PROVIDER_SITE_OTHER): Payer: Medicare HMO | Admitting: Family Medicine

## 2019-05-15 ENCOUNTER — Other Ambulatory Visit: Payer: Self-pay

## 2019-05-15 ENCOUNTER — Encounter: Payer: Self-pay | Admitting: Family Medicine

## 2019-05-15 VITALS — BP 114/60 | HR 91 | Temp 97.5°F | Resp 15 | Ht 66.0 in | Wt 151.0 lb

## 2019-05-15 DIAGNOSIS — Z7901 Long term (current) use of anticoagulants: Secondary | ICD-10-CM | POA: Diagnosis not present

## 2019-05-15 DIAGNOSIS — Z0000 Encounter for general adult medical examination without abnormal findings: Secondary | ICD-10-CM

## 2019-05-15 DIAGNOSIS — E785 Hyperlipidemia, unspecified: Secondary | ICD-10-CM | POA: Diagnosis not present

## 2019-05-15 DIAGNOSIS — E559 Vitamin D deficiency, unspecified: Secondary | ICD-10-CM

## 2019-05-15 DIAGNOSIS — Z86718 Personal history of other venous thrombosis and embolism: Secondary | ICD-10-CM | POA: Diagnosis not present

## 2019-05-15 DIAGNOSIS — M48062 Spinal stenosis, lumbar region with neurogenic claudication: Secondary | ICD-10-CM | POA: Diagnosis not present

## 2019-05-15 DIAGNOSIS — M1711 Unilateral primary osteoarthritis, right knee: Secondary | ICD-10-CM | POA: Diagnosis not present

## 2019-05-15 DIAGNOSIS — M79651 Pain in right thigh: Secondary | ICD-10-CM

## 2019-05-15 DIAGNOSIS — I890 Lymphedema, not elsewhere classified: Secondary | ICD-10-CM | POA: Diagnosis not present

## 2019-05-15 DIAGNOSIS — I10 Essential (primary) hypertension: Secondary | ICD-10-CM

## 2019-05-15 DIAGNOSIS — R7301 Impaired fasting glucose: Secondary | ICD-10-CM

## 2019-05-15 DIAGNOSIS — Z9181 History of falling: Secondary | ICD-10-CM | POA: Diagnosis not present

## 2019-05-15 NOTE — Progress Notes (Signed)
    [name] [name]     MRN: [id number]      DOB: July 07, 1932  HPI: Patient is in for annual physical exam. C/o right thighj pain, wants this checked, somewhat better with therapy and had good pain relief on prednisone which has returned since withdrawal. Reports marked improvement in leg ulcers, currently in unna boot and followed by vascular  Denies rectal bleeding, epistaxis or hematuria while on prednisone  PE: BP 114/60   Pulse 91   Temp (!) 97.5 F (36.4 C) (Temporal)   Resp 15   Ht 5' 6"  (1.676 m)   Wt 151 lb 0.6 oz (68.5 kg)   SpO2 97%   BMI 24.38 kg/m  Pleasant  female, alert and oriented x 3, in no cardio-pulmonary distress. Afebrile. HEENT No facial trauma or asymetry. Sinuses non tender.  Extra occullar muscles intact.. External ears normal, . Neck: supple, no adenopathy,JVD or thyromegaly.No bruits.  Chest: Clear to ascultation bilaterally.No crackles or wheezes. Non tender to palpation  Breast: No physical exam at visit,mammogram is up to date and normal  Cardiovascular system; Heart sounds normal,  S1 and  S2 ,no S3.  No murmur, or thrill. Apical beat not displaced Peripheral pulses normal.  Abdomen: Soft, non tender,No guarding, tenderness or rebound.   l masses, no cervical motion or adnexal tenderness.   Musculoskeletal exam: Decreased ROM of spine, hips , shoulders and knees.  deformity ,swelling or crepitus noted.  muscle wasting in lower extremities.   Neurologic: Cranial nerves 2 to 12 intact. Power, tone  normal throughout. disturbance in gait. No tremor.  Skin: Intact, no ulceration, erythema , scaling or rash noted. Bilateral unna boots present , skin on lower extremities not examined Pigmentation normal throughout  Psych; Normal mood and affect. Judgement and concentration normal   Assessment & Plan:  Annual physical exam Annual exam as documented.  Regular seat belt use and home safety, is also discussed.

## 2019-05-15 NOTE — Assessment & Plan Note (Signed)
Annual exam as documented.  Regular seat belt use and home safety, is also discussed.

## 2019-05-15 NOTE — Patient Instructions (Addendum)
F/U phone/ video in 4 months, re evaluate labs   Please get fasting cholesterol and vit D, CBC, cmp and EGFr and hBA1C   No more eliqis after March  I recommend calling Dr Jannifer Franklin re pain resurgence after stopping prednisone  Thankful ulcers have improved, please keep vascular appointment  Blood pressure is excellent  Xray of right thigh is ordered you may get that today or as soon as convenient, I think pain is from spine  Thanks for choosing [facility], we consider it a privelige to serve you.

## 2019-05-15 NOTE — Assessment & Plan Note (Signed)
C/o increased and disabling right thigh pain likley from spine disease, requests x ray, same ordered, no recent falls

## 2019-05-17 DIAGNOSIS — I890 Lymphedema, not elsewhere classified: Secondary | ICD-10-CM | POA: Diagnosis not present

## 2019-05-17 DIAGNOSIS — M1711 Unilateral primary osteoarthritis, right knee: Secondary | ICD-10-CM | POA: Diagnosis not present

## 2019-05-17 DIAGNOSIS — Z7901 Long term (current) use of anticoagulants: Secondary | ICD-10-CM | POA: Diagnosis not present

## 2019-05-17 DIAGNOSIS — M48062 Spinal stenosis, lumbar region with neurogenic claudication: Secondary | ICD-10-CM | POA: Diagnosis not present

## 2019-05-17 DIAGNOSIS — Z86718 Personal history of other venous thrombosis and embolism: Secondary | ICD-10-CM | POA: Diagnosis not present

## 2019-05-17 DIAGNOSIS — I10 Essential (primary) hypertension: Secondary | ICD-10-CM | POA: Diagnosis not present

## 2019-05-17 DIAGNOSIS — Z9181 History of falling: Secondary | ICD-10-CM | POA: Diagnosis not present

## 2019-05-19 DIAGNOSIS — M1711 Unilateral primary osteoarthritis, right knee: Secondary | ICD-10-CM | POA: Diagnosis not present

## 2019-05-19 DIAGNOSIS — I10 Essential (primary) hypertension: Secondary | ICD-10-CM | POA: Diagnosis not present

## 2019-05-19 DIAGNOSIS — Z86718 Personal history of other venous thrombosis and embolism: Secondary | ICD-10-CM | POA: Diagnosis not present

## 2019-05-19 DIAGNOSIS — M48062 Spinal stenosis, lumbar region with neurogenic claudication: Secondary | ICD-10-CM | POA: Diagnosis not present

## 2019-05-19 DIAGNOSIS — I890 Lymphedema, not elsewhere classified: Secondary | ICD-10-CM | POA: Diagnosis not present

## 2019-05-19 DIAGNOSIS — Z7901 Long term (current) use of anticoagulants: Secondary | ICD-10-CM | POA: Diagnosis not present

## 2019-05-19 DIAGNOSIS — Z9181 History of falling: Secondary | ICD-10-CM | POA: Diagnosis not present

## 2019-05-22 DIAGNOSIS — I890 Lymphedema, not elsewhere classified: Secondary | ICD-10-CM | POA: Diagnosis not present

## 2019-05-22 DIAGNOSIS — Z86718 Personal history of other venous thrombosis and embolism: Secondary | ICD-10-CM | POA: Diagnosis not present

## 2019-05-22 DIAGNOSIS — M1711 Unilateral primary osteoarthritis, right knee: Secondary | ICD-10-CM | POA: Diagnosis not present

## 2019-05-22 DIAGNOSIS — I10 Essential (primary) hypertension: Secondary | ICD-10-CM | POA: Diagnosis not present

## 2019-05-22 DIAGNOSIS — Z9181 History of falling: Secondary | ICD-10-CM | POA: Diagnosis not present

## 2019-05-22 DIAGNOSIS — Z7901 Long term (current) use of anticoagulants: Secondary | ICD-10-CM | POA: Diagnosis not present

## 2019-05-22 DIAGNOSIS — M48062 Spinal stenosis, lumbar region with neurogenic claudication: Secondary | ICD-10-CM | POA: Diagnosis not present

## 2019-05-23 DIAGNOSIS — I10 Essential (primary) hypertension: Secondary | ICD-10-CM | POA: Diagnosis not present

## 2019-05-23 DIAGNOSIS — Z86718 Personal history of other venous thrombosis and embolism: Secondary | ICD-10-CM | POA: Diagnosis not present

## 2019-05-23 DIAGNOSIS — I890 Lymphedema, not elsewhere classified: Secondary | ICD-10-CM | POA: Diagnosis not present

## 2019-05-23 DIAGNOSIS — Z9181 History of falling: Secondary | ICD-10-CM | POA: Diagnosis not present

## 2019-05-23 DIAGNOSIS — M48062 Spinal stenosis, lumbar region with neurogenic claudication: Secondary | ICD-10-CM | POA: Diagnosis not present

## 2019-05-23 DIAGNOSIS — M1711 Unilateral primary osteoarthritis, right knee: Secondary | ICD-10-CM | POA: Diagnosis not present

## 2019-05-23 DIAGNOSIS — Z7901 Long term (current) use of anticoagulants: Secondary | ICD-10-CM | POA: Diagnosis not present

## 2019-05-24 DIAGNOSIS — Z9181 History of falling: Secondary | ICD-10-CM | POA: Diagnosis not present

## 2019-05-24 DIAGNOSIS — I10 Essential (primary) hypertension: Secondary | ICD-10-CM | POA: Diagnosis not present

## 2019-05-24 DIAGNOSIS — Z7901 Long term (current) use of anticoagulants: Secondary | ICD-10-CM | POA: Diagnosis not present

## 2019-05-24 DIAGNOSIS — Z86718 Personal history of other venous thrombosis and embolism: Secondary | ICD-10-CM | POA: Diagnosis not present

## 2019-05-24 DIAGNOSIS — I890 Lymphedema, not elsewhere classified: Secondary | ICD-10-CM | POA: Diagnosis not present

## 2019-05-24 DIAGNOSIS — M1711 Unilateral primary osteoarthritis, right knee: Secondary | ICD-10-CM | POA: Diagnosis not present

## 2019-05-24 DIAGNOSIS — M48062 Spinal stenosis, lumbar region with neurogenic claudication: Secondary | ICD-10-CM | POA: Diagnosis not present

## 2019-05-25 ENCOUNTER — Telehealth (HOSPITAL_COMMUNITY): Payer: Self-pay

## 2019-05-25 NOTE — Telephone Encounter (Signed)

## 2019-05-26 DIAGNOSIS — M1711 Unilateral primary osteoarthritis, right knee: Secondary | ICD-10-CM | POA: Diagnosis not present

## 2019-05-26 DIAGNOSIS — I872 Venous insufficiency (chronic) (peripheral): Secondary | ICD-10-CM | POA: Diagnosis not present

## 2019-05-26 DIAGNOSIS — Z7901 Long term (current) use of anticoagulants: Secondary | ICD-10-CM | POA: Diagnosis not present

## 2019-05-26 DIAGNOSIS — L97221 Non-pressure chronic ulcer of left calf limited to breakdown of skin: Secondary | ICD-10-CM | POA: Diagnosis not present

## 2019-05-26 DIAGNOSIS — I890 Lymphedema, not elsewhere classified: Secondary | ICD-10-CM | POA: Diagnosis not present

## 2019-05-26 DIAGNOSIS — Z9181 History of falling: Secondary | ICD-10-CM | POA: Diagnosis not present

## 2019-05-26 DIAGNOSIS — M48062 Spinal stenosis, lumbar region with neurogenic claudication: Secondary | ICD-10-CM | POA: Diagnosis not present

## 2019-05-26 DIAGNOSIS — I10 Essential (primary) hypertension: Secondary | ICD-10-CM | POA: Diagnosis not present

## 2019-05-26 DIAGNOSIS — Z86718 Personal history of other venous thrombosis and embolism: Secondary | ICD-10-CM | POA: Diagnosis not present

## 2019-05-29 ENCOUNTER — Ambulatory Visit (HOSPITAL_COMMUNITY)
Admission: RE | Admit: 2019-05-29 | Discharge: 2019-05-29 | Disposition: A | Discharge status: Home / Self Care | Payer: Medicare HMO | Source: Ambulatory Visit | Attending: Vascular Surgery | Admitting: Vascular Surgery

## 2019-05-29 ENCOUNTER — Ambulatory Visit (INDEPENDENT_AMBULATORY_CARE_PROVIDER_SITE_OTHER): Payer: Medicare HMO | Admitting: Physician Assistant

## 2019-05-29 ENCOUNTER — Other Ambulatory Visit: Payer: Self-pay

## 2019-05-29 VITALS — BP 146/66 | HR 88 | Temp 97.9°F | Resp 20 | Ht 66.0 in | Wt 153.0 lb

## 2019-05-29 DIAGNOSIS — R238 Other skin changes: Secondary | ICD-10-CM

## 2019-05-29 DIAGNOSIS — I739 Peripheral vascular disease, unspecified: Secondary | ICD-10-CM | POA: Insufficient documentation

## 2019-05-29 DIAGNOSIS — M7989 Other specified soft tissue disorders: Secondary | ICD-10-CM

## 2019-05-29 MED ORDER — SULFAMETHOXAZOLE-TRIMETHOPRIM 400-80 MG PO TABS: 1.0000 | ORAL_TABLET | Freq: Two times a day (BID) | ORAL | 0 refills | Status: DC

## 2019-05-29 NOTE — Progress Notes (Signed)
OFFICE NOTE    CC:  F/u for left lower extremity DVT HPI:  This is a 86 y.o. female who is s/p extensive left lower extremity DVT, started on oral anticoagulation February 05, 2019.  Approximately 6 weeks ago she developed blistering of her left lower extremity and was seen by Dr. Trula Slade.  She was placed in the left lower extremity Unna boot and prescribed Bactrim for 10 days.  In addition, he noted a new ulcer on her right lower extremity and an Unna boot was placed on this leg as well.  She had edema bilaterally. She comes in today also for ABIs to ensure there is no arterial component of these ulcers.  Allergies  Allergen Reactions  . Cellcept [Mycophenolate Mofetil]     Muscle cramps, mouth soreness, difficulty swallowing, bruising  . Statins     Dx of in  . Ciprofloxacin Hcl Other (See Comments)    Chills, sick, could not tolerate it  . Hydrocodone Nausea Only  . Penicillins Rash    Current Outpatient Medications  Medication Sig Dispense Refill  . apixaban (ELIQUIS) 5 MG TABS tablet Take 1 tablet (5 mg total) by mouth 2 (two) times daily. 180 tablet 0  . ergocalciferol (VITAMIN D2) 1.25 MG (50000 UT) capsule Take 1 capsule (50,000 Units total) by mouth once a week. One capsule once weekly  Started takinjg in January 2021, will take for 6 months only and re check) 12 capsule 1  . ezetimibe (ZETIA) 10 MG tablet TAKE 1 TABLET EVERY DAY 90 tablet 3  . folic acid (FOLVITE) 1 MG tablet Take 1 tablet (1 mg total) by mouth daily. 90 tablet 3  . gabapentin (NEURONTIN) 100 MG capsule Take 1 capsule (100 mg total) by mouth at bedtime. 90 capsule 3  . LINZESS 145 MCG CAPS capsule     . triamterene-hydrochlorothiazide (MAXZIDE) 75-50 MG tablet TAKE 1 TABLET EVERY DAY 90 tablet 1  . UNABLE TO FIND Lift chair x 1  DX M48.06, M53.87 1 each 0   No current facility-administered medications for this visit.     ROS:  See HPI  Physical Exam:  Right: Resting right ankle-brachial index is  within normal range. Pedal  pressures may be falsely elevated. The right toe-brachial index is  abnormal. RT great toe pressure = 88 mmHg.   Left: Resting left ankle-brachial index is within normal range. Pedal  pressures may be falsely elevated. The left toe-brachial index is  abnormal. LT Great toe pressure = 88 mmHg.   Extremities: 2+ pitting edema bilaterally.  No ulcerations of the right lower extremity.  Left lower extremity bullous lesions improved.  She has point tenderness of the left lateral malleolus.  Mild erythema left lateral foot brisk dopplerable PT and DP pulses bilaterally Neuro: Alert and oriented x4         Assessment/Plan:  This is a 86 y.o. female who is s/p: Left lower extremity extensive DVT diagnosed in November 2020.  She is maintained on Eliquis.  She was noted to have blisters and ulcerations of both ankles and was placed in Unna boots.  She continues to have bilateral lower extremity edema and states she she was recently given furosemide.  May need further dosing.  Will repeat Bactrim for left foot.  Replace Unna boot left lower leg.  Compression stocking to right lower leg Follow-up in 2 to 3 weeks  -   Risa Grill, PA-C [facility] [phone]  Clinic MD: Trula Slade

## 2019-05-31 DIAGNOSIS — Z86718 Personal history of other venous thrombosis and embolism: Secondary | ICD-10-CM | POA: Diagnosis not present

## 2019-05-31 DIAGNOSIS — M1711 Unilateral primary osteoarthritis, right knee: Secondary | ICD-10-CM | POA: Diagnosis not present

## 2019-05-31 DIAGNOSIS — I872 Venous insufficiency (chronic) (peripheral): Secondary | ICD-10-CM | POA: Diagnosis not present

## 2019-05-31 DIAGNOSIS — Z7901 Long term (current) use of anticoagulants: Secondary | ICD-10-CM | POA: Diagnosis not present

## 2019-05-31 DIAGNOSIS — L97221 Non-pressure chronic ulcer of left calf limited to breakdown of skin: Secondary | ICD-10-CM | POA: Diagnosis not present

## 2019-05-31 DIAGNOSIS — Z9181 History of falling: Secondary | ICD-10-CM | POA: Diagnosis not present

## 2019-05-31 DIAGNOSIS — I10 Essential (primary) hypertension: Secondary | ICD-10-CM | POA: Diagnosis not present

## 2019-05-31 DIAGNOSIS — M48062 Spinal stenosis, lumbar region with neurogenic claudication: Secondary | ICD-10-CM | POA: Diagnosis not present

## 2019-05-31 DIAGNOSIS — I890 Lymphedema, not elsewhere classified: Secondary | ICD-10-CM | POA: Diagnosis not present

## 2019-06-02 DIAGNOSIS — M48062 Spinal stenosis, lumbar region with neurogenic claudication: Secondary | ICD-10-CM | POA: Diagnosis not present

## 2019-06-02 DIAGNOSIS — Z9181 History of falling: Secondary | ICD-10-CM | POA: Diagnosis not present

## 2019-06-02 DIAGNOSIS — Z86718 Personal history of other venous thrombosis and embolism: Secondary | ICD-10-CM | POA: Diagnosis not present

## 2019-06-02 DIAGNOSIS — I890 Lymphedema, not elsewhere classified: Secondary | ICD-10-CM | POA: Diagnosis not present

## 2019-06-02 DIAGNOSIS — I10 Essential (primary) hypertension: Secondary | ICD-10-CM | POA: Diagnosis not present

## 2019-06-02 DIAGNOSIS — L97221 Non-pressure chronic ulcer of left calf limited to breakdown of skin: Secondary | ICD-10-CM | POA: Diagnosis not present

## 2019-06-02 DIAGNOSIS — M1711 Unilateral primary osteoarthritis, right knee: Secondary | ICD-10-CM | POA: Diagnosis not present

## 2019-06-02 DIAGNOSIS — Z7901 Long term (current) use of anticoagulants: Secondary | ICD-10-CM | POA: Diagnosis not present

## 2019-06-02 DIAGNOSIS — I872 Venous insufficiency (chronic) (peripheral): Secondary | ICD-10-CM | POA: Diagnosis not present

## 2019-06-06 ENCOUNTER — Inpatient Hospital Stay (HOSPITAL_COMMUNITY): Payer: Medicare HMO | Attending: Hematology

## 2019-06-06 ENCOUNTER — Other Ambulatory Visit: Payer: Self-pay

## 2019-06-06 DIAGNOSIS — I890 Lymphedema, not elsewhere classified: Secondary | ICD-10-CM | POA: Diagnosis not present

## 2019-06-06 DIAGNOSIS — E538 Deficiency of other specified B group vitamins: Secondary | ICD-10-CM | POA: Insufficient documentation

## 2019-06-06 DIAGNOSIS — Z8542 Personal history of malignant neoplasm of other parts of uterus: Secondary | ICD-10-CM | POA: Insufficient documentation

## 2019-06-06 DIAGNOSIS — Z9181 History of falling: Secondary | ICD-10-CM | POA: Diagnosis not present

## 2019-06-06 DIAGNOSIS — Z86718 Personal history of other venous thrombosis and embolism: Secondary | ICD-10-CM | POA: Diagnosis not present

## 2019-06-06 DIAGNOSIS — M1711 Unilateral primary osteoarthritis, right knee: Secondary | ICD-10-CM | POA: Diagnosis not present

## 2019-06-06 DIAGNOSIS — I10 Essential (primary) hypertension: Secondary | ICD-10-CM | POA: Diagnosis not present

## 2019-06-06 DIAGNOSIS — L97221 Non-pressure chronic ulcer of left calf limited to breakdown of skin: Secondary | ICD-10-CM | POA: Diagnosis not present

## 2019-06-06 DIAGNOSIS — I872 Venous insufficiency (chronic) (peripheral): Secondary | ICD-10-CM | POA: Diagnosis not present

## 2019-06-06 DIAGNOSIS — Z7901 Long term (current) use of anticoagulants: Secondary | ICD-10-CM | POA: Diagnosis not present

## 2019-06-06 DIAGNOSIS — I82412 Acute embolism and thrombosis of left femoral vein: Secondary | ICD-10-CM

## 2019-06-06 DIAGNOSIS — M48062 Spinal stenosis, lumbar region with neurogenic claudication: Secondary | ICD-10-CM | POA: Diagnosis not present

## 2019-06-06 LAB — CBC WITH DIFFERENTIAL/PLATELET
Abs Immature Granulocytes: 0.03 10*3/uL (ref 0.00–0.07)
Basophils Absolute: 0 10*3/uL (ref 0.0–0.1)
Basophils Relative: 0 %
Eosinophils Absolute: 0.1 10*3/uL (ref 0.0–0.5)
Eosinophils Relative: 1 %
HCT: 36.4 % (ref 36.0–46.0)
Hemoglobin: 11.4 g/dL — ABNORMAL LOW (ref 12.0–15.0)
Immature Granulocytes: 0 %
Lymphocytes Relative: 16 %
Lymphs Abs: 1.3 10*3/uL (ref 0.7–4.0)
MCH: 29.8 pg (ref 26.0–34.0)
MCHC: 31.3 g/dL (ref 30.0–36.0)
MCV: 95.3 fL (ref 80.0–100.0)
Monocytes Absolute: 0.7 10*3/uL (ref 0.1–1.0)
Monocytes Relative: 8 %
Neutro Abs: 6.1 10*3/uL (ref 1.7–7.7)
Neutrophils Relative %: 75 %
Platelets: 234 10*3/uL (ref 150–400)
RBC: 3.82 MIL/uL — ABNORMAL LOW (ref 3.87–5.11)
RDW: 12.8 % (ref 11.5–15.5)
WBC: 8.2 10*3/uL (ref 4.0–10.5)
nRBC: 0 % (ref 0.0–0.2)

## 2019-06-06 LAB — COMPREHENSIVE METABOLIC PANEL
ALT: 37 U/L (ref 0–44)
AST: 57 U/L — ABNORMAL HIGH (ref 15–41)
Albumin: 3.6 g/dL (ref 3.5–5.0)
Alkaline Phosphatase: 42 U/L (ref 38–126)
Anion gap: 9 (ref 5–15)
BUN: 28 mg/dL — ABNORMAL HIGH (ref 8–23)
CO2: 26 mmol/L (ref 22–32)
Calcium: 9.4 mg/dL (ref 8.9–10.3)
Chloride: 103 mmol/L (ref 98–111)
Creatinine, Ser: 0.75 mg/dL (ref 0.44–1.00)
GFR calc Af Amer: >60 mL/min (ref >60)
GFR calc non Af Amer: >60 mL/min (ref >60)
Glucose, Bld: 106 mg/dL — ABNORMAL HIGH (ref 70–99)
Potassium: 4 mmol/L (ref 3.5–5.1)
Sodium: 138 mmol/L (ref 135–145)
Total Bilirubin: 0.6 mg/dL (ref 0.3–1.2)
Total Protein: 6.7 g/dL (ref 6.5–8.1)

## 2019-06-06 LAB — IRON AND TIBC
Iron: 57 ug/dL (ref 28–170)
Saturation Ratios: 18 % (ref 10.4–31.8)
TIBC: 317 ug/dL (ref 250–450)
UIBC: 260 ug/dL

## 2019-06-06 LAB — FOLATE: Folate: >100 ng/mL (ref >5.9)

## 2019-06-06 LAB — D-DIMER, QUANTITATIVE: D-Dimer, Quant: 1.42 ug/mL-FEU — ABNORMAL HIGH (ref 0.00–0.50)

## 2019-06-06 LAB — VITAMIN B12: Vitamin B-12: 166 pg/mL — ABNORMAL LOW (ref 180–914)

## 2019-06-06 LAB — FERRITIN: Ferritin: 104 ng/mL (ref 11–307)

## 2019-06-09 DIAGNOSIS — Z9181 History of falling: Secondary | ICD-10-CM | POA: Diagnosis not present

## 2019-06-09 DIAGNOSIS — Z86718 Personal history of other venous thrombosis and embolism: Secondary | ICD-10-CM | POA: Diagnosis not present

## 2019-06-09 DIAGNOSIS — M1711 Unilateral primary osteoarthritis, right knee: Secondary | ICD-10-CM | POA: Diagnosis not present

## 2019-06-09 DIAGNOSIS — I872 Venous insufficiency (chronic) (peripheral): Secondary | ICD-10-CM | POA: Diagnosis not present

## 2019-06-09 DIAGNOSIS — I890 Lymphedema, not elsewhere classified: Secondary | ICD-10-CM | POA: Diagnosis not present

## 2019-06-09 DIAGNOSIS — M48062 Spinal stenosis, lumbar region with neurogenic claudication: Secondary | ICD-10-CM | POA: Diagnosis not present

## 2019-06-09 DIAGNOSIS — I10 Essential (primary) hypertension: Secondary | ICD-10-CM | POA: Diagnosis not present

## 2019-06-09 DIAGNOSIS — Z7901 Long term (current) use of anticoagulants: Secondary | ICD-10-CM | POA: Diagnosis not present

## 2019-06-09 DIAGNOSIS — L97221 Non-pressure chronic ulcer of left calf limited to breakdown of skin: Secondary | ICD-10-CM | POA: Diagnosis not present

## 2019-06-13 ENCOUNTER — Other Ambulatory Visit: Payer: Self-pay

## 2019-06-13 ENCOUNTER — Inpatient Hospital Stay (HOSPITAL_BASED_OUTPATIENT_CLINIC_OR_DEPARTMENT_OTHER): Payer: Medicare HMO | Admitting: Hematology

## 2019-06-13 DIAGNOSIS — I890 Lymphedema, not elsewhere classified: Secondary | ICD-10-CM | POA: Diagnosis not present

## 2019-06-13 DIAGNOSIS — L97221 Non-pressure chronic ulcer of left calf limited to breakdown of skin: Secondary | ICD-10-CM | POA: Diagnosis not present

## 2019-06-13 DIAGNOSIS — I82412 Acute embolism and thrombosis of left femoral vein: Secondary | ICD-10-CM | POA: Diagnosis not present

## 2019-06-13 DIAGNOSIS — Z86718 Personal history of other venous thrombosis and embolism: Secondary | ICD-10-CM | POA: Diagnosis not present

## 2019-06-13 DIAGNOSIS — I10 Essential (primary) hypertension: Secondary | ICD-10-CM | POA: Diagnosis not present

## 2019-06-13 DIAGNOSIS — Z7901 Long term (current) use of anticoagulants: Secondary | ICD-10-CM | POA: Diagnosis not present

## 2019-06-13 DIAGNOSIS — M48062 Spinal stenosis, lumbar region with neurogenic claudication: Secondary | ICD-10-CM | POA: Diagnosis not present

## 2019-06-13 DIAGNOSIS — M1711 Unilateral primary osteoarthritis, right knee: Secondary | ICD-10-CM | POA: Diagnosis not present

## 2019-06-13 DIAGNOSIS — Z9181 History of falling: Secondary | ICD-10-CM | POA: Diagnosis not present

## 2019-06-13 DIAGNOSIS — I872 Venous insufficiency (chronic) (peripheral): Secondary | ICD-10-CM | POA: Diagnosis not present

## 2019-06-13 MED ORDER — APIXABAN 5 MG PO TABS: 5.0000 mg | ORAL_TABLET | Freq: Two times a day (BID) | ORAL | 0 refills | Status: DC

## 2019-06-13 NOTE — Progress Notes (Signed)
Virtual Visit via Telephone Note  I connected with [name] [name] on 06/13/19 at  4:05 PM EDT by telephone and verified that I am speaking with the correct person using two identifiers.   I discussed the limitations, risks, security and privacy concerns of performing an evaluation and management service by telephone and the availability of in person appointments. I also discussed with the patient that there may be a patient responsible charge related to this service. The patient expressed understanding and agreed to proceed.   History of Present Illness: She was seen by me in the office for weekly provoked left leg DVT on 02/15/2019.  She fell few times prior to presentation to the ER on 02/05/2019 with leg swelling.  Doppler showed occlusive DVT extending from proximal left femoral vein through the left popliteal vein.   Observations/Objective: She reportedly stopped taking Eliquis a week ago.  Did not experience any falls while on Eliquis.  No history of bleeding while on Eliquis.  She felt cold when she was taking Eliquis.  Assessment and Plan:  1.  Weakly provoked left leg DVT: -Diagnosed on 02/15/2019, Eliquis discontinued around 06/06/2019. -We reviewed D-dimer elevated at 1.42 on 06/06/2019. 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 -Hence I have recommended restarting Eliquis.  I have sent a prescription for it. -I will reevaluate her in 3 months with repeat ultrasound and D-dimer.  2.  Endometrial adenocarcinoma: -She had T1b N0 well-differentiated endometrial adenocarcinoma, FIGO grade 1, TAH and BSO with lymphadenectomy done on 12/14/2006. -CT scan did not show any evidence of recurrence.  3.  Vitamin B12 deficiency: -Latest vitamin B12 was low at 166.  I have recommended her to take vitamin B-12 1 mg daily.  I plan to recheck it at next time.   Follow Up Instructions: RTC 3 months with labs and scan.   I discussed the  assessment and treatment plan with the patient. The patient was provided an opportunity to ask questions and all were answered. The patient agreed with the plan and demonstrated an understanding of the instructions.   The patient was advised to call back or seek an in-person evaluation if the symptoms worsen or if the condition fails to improve as anticipated.  I provided 12 minutes of non-face-to-face time during this encounter.   Derek Jack, MD

## 2019-06-16 ENCOUNTER — Telehealth (HOSPITAL_COMMUNITY): Payer: Self-pay

## 2019-06-16 DIAGNOSIS — M1711 Unilateral primary osteoarthritis, right knee: Secondary | ICD-10-CM | POA: Diagnosis not present

## 2019-06-16 DIAGNOSIS — I872 Venous insufficiency (chronic) (peripheral): Secondary | ICD-10-CM | POA: Diagnosis not present

## 2019-06-16 DIAGNOSIS — Z9181 History of falling: Secondary | ICD-10-CM | POA: Diagnosis not present

## 2019-06-16 DIAGNOSIS — I10 Essential (primary) hypertension: Secondary | ICD-10-CM | POA: Diagnosis not present

## 2019-06-16 DIAGNOSIS — Z86718 Personal history of other venous thrombosis and embolism: Secondary | ICD-10-CM | POA: Diagnosis not present

## 2019-06-16 DIAGNOSIS — L97221 Non-pressure chronic ulcer of left calf limited to breakdown of skin: Secondary | ICD-10-CM | POA: Diagnosis not present

## 2019-06-16 DIAGNOSIS — I890 Lymphedema, not elsewhere classified: Secondary | ICD-10-CM | POA: Diagnosis not present

## 2019-06-16 DIAGNOSIS — M48062 Spinal stenosis, lumbar region with neurogenic claudication: Secondary | ICD-10-CM | POA: Diagnosis not present

## 2019-06-16 DIAGNOSIS — Z7901 Long term (current) use of anticoagulants: Secondary | ICD-10-CM | POA: Diagnosis not present

## 2019-06-16 NOTE — Telephone Encounter (Signed)

## 2019-06-20 ENCOUNTER — Ambulatory Visit (INDEPENDENT_AMBULATORY_CARE_PROVIDER_SITE_OTHER): Payer: Medicare HMO | Admitting: Physician Assistant

## 2019-06-20 ENCOUNTER — Other Ambulatory Visit: Payer: Self-pay

## 2019-06-20 VITALS — BP 154/76 | HR 86 | Temp 97.2°F | Ht 64.0 in | Wt 152.1 lb

## 2019-06-20 DIAGNOSIS — M7989 Other specified soft tissue disorders: Secondary | ICD-10-CM

## 2019-06-20 NOTE — Progress Notes (Signed)
Patient ID: [name] [name], female   DOB: 27-Jun-1932, 86 y.o.   MRN: [id number]  Reason for Consult: 2-3 week follow up   Referred by Fayrene Helper, MD  Subjective:     CC:  F/u for left lower extremity DVT HPI:  This is a 86 y.o. female who is s/p extensive left lower extremity DVT, started on oral anticoagulation February 05, 2019.  Approximately 9 weeks ago she developed blistering of her left lower extremity and was seen by Dr. Trula Slade.  She was placed in the left lower extremity Unna boot and prescribed Bactrim for 10 days.  In addition, he noted a new ulcer on her right lower extremity and an Unna boot was placed on this leg as well.  She had edema bilaterally.   She was maintained on apixiban 68m BID for 3 months and this was discontinued by her primary care physician, however her hematologist restarted her Eliquis today.  She has no complaints.  She has been wearing her Unna boot on the left lower leg and a light compressive stocking on the right lower leg.  She returned on May 29, 2019 for ABIs to ensure there was no arterial component of these ulcers.    Past Medical History:  Diagnosis Date  . Arthritis    spinal stenosis  . Elevated LFTs    secondary to fatty liver, negative work-up in 2011  . Hypercholesterolemia   . Hypertension   . Lumbar spinal stenosis 01/14/2018   L3-4 and L4-5  . Myopathy 02/15/2018  . Osteoarthritis    right knee  . Peripheral neuropathy 07/06/2018  . PONV (postoperative nausea and vomiting)   . [place]   grade 1, no recurrence up to 2013   Family History  Problem Relation Age of Onset  . Heart disease Father   . Bladder Cancer Brother        in remission   . Hypertension Sister   . Dementia Sister   . Diabetes type II Sister   . Hypertension Sister   . Aneurysm Sister   . Hypertension Brother   . Arthritis Brother   . Hypertension Son   . Colon cancer Neg Hx    Past Surgical History:  Procedure  Laterality Date  . 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 APPENDECTOMY  1973  . BREAST BIOPSY Left 2018   Benign  . CHOLECYSTECTOMY  1973  . COLONOSCOPY    06/21/2007   RGGY:IRSWNIrectum/Sigmoid diverticula, diminutive hepatic flexure polyp s/p bx. Benign.   . COLONOSCOPY N/A 10/17/2012   Dr. FOneida Alar moderate diverticulosis was noted in the sigmoid colon, moderate sized internal hemorrhoids. next tcs in10 years  . ESOPHAGOGASTRODUODENOSCOPY N/A 03/20/2016   Dr. FOneida Alar widely patent Schatzki ring, anemia due to ASA induced erosive gastritis, mild duodenitis. gastric bx benign without H.pylori.   .Marland KitchenFOOT SURGERY  2007   Pins in toes on left foot, 5 to 6 yrs ago  . GIVENS CAPSULE STUDY N/A 03/20/2016   Procedure: GIVENS CAPSULE STUDY;  Surgeon: SDanie Binder MD;  [facility];  Service: Endoscopy;  Laterality: N/A;  . MUSCLE BIOPSY Left 06/07/2018   Procedure: LEFT THIGH MUSCLE BIOPSY;  Surgeon: CErroll Luna MD;  [facility]  Service: General;  Laterality: Left;  . TONSILLECTOMY      Short Social History:  Social History   Tobacco Use  . Smoking status: Never Smoker  . Smokeless  tobacco: Never Used  . Tobacco comment: Never smoked  Substance Use Topics  . Alcohol use: No    Allergies  Allergen Reactions  . Cellcept [Mycophenolate Mofetil]     Muscle cramps, mouth soreness, difficulty swallowing, bruising  . Statins     Dx of in  . Ciprofloxacin Hcl Other (See Comments)    Chills, sick, could not tolerate it  . Hydrocodone Nausea Only  . Penicillins Rash    Current Outpatient Medications  Medication Sig Dispense Refill  . acetaminophen (TYLENOL) 500 MG tablet Take 500 mg by mouth every 6 (six) hours as needed.    Marland Kitchen apixaban (ELIQUIS) 5 MG TABS tablet Take 1 tablet (5 mg total) by mouth 2 (two) times daily. 180 tablet 0  . ergocalciferol (VITAMIN D2) 1.25 MG (50000 UT) capsule Take 1 capsule (50,000 Units total) by  mouth once a week. One capsule once weekly  Started takinjg in January 2021, will take for 6 months only and re check) 12 capsule 1  . ezetimibe (ZETIA) 10 MG tablet TAKE 1 TABLET EVERY DAY 90 tablet 3  . folic acid (FOLVITE) 1 MG tablet Take 1 tablet (1 mg total) by mouth daily. 90 tablet 3  . gabapentin (NEURONTIN) 100 MG capsule Take 1 capsule (100 mg total) by mouth at bedtime. 90 capsule 3  . LINZESS 145 MCG CAPS capsule Take 145 mcg by mouth as needed.     . triamterene-hydrochlorothiazide (MAXZIDE) 75-50 MG tablet TAKE 1 TABLET EVERY DAY 90 tablet 1  . UNABLE TO FIND Lift chair x 1  DX M48.06, M53.87 1 each 0   No current facility-administered medications for this visit.    REVIEW OF SYSTEMS      Objective:  Objective   Vitals:   06/20/19 1447  BP: (!) 154/76  Pulse: 86  Temp: (!) 97.2 F (36.2 C)  Weight: 152 lb 1.6 oz (69 kg)  Height: 5' 4"  (1.626 m)   Body mass index is 26.11 kg/m.  Physical Exam  Data General appearance: Well-developed well-nourished female no apparent distress Respiratory: Nonlabored Lower extremity exam: Her Unna boot is removed from her left lower extremity.  The bullous lesions previously described have resolved.  Her skin is intact.  She has a 2+ dorsalis pedis pulse on the left and trace edema of the lower leg.  She has 2+ pitting edema on the right lower leg.  Skin is intact.  Foot is warm.  Pedal pulses are nonpalpable.      Assessment/Plan:    Status post left extremity DVT November 2020.  She developed bullous lesions that have resolved.  Her hematologist recommends continuing Eliquis twice daily.  She has gotten full benefit from the [employer], however I explained to her she needs to wear hi stronger compression stockings.  She states she has these at home and will wear them on both lower legs.  Elevate legs above the heart as often as possible.  We will recheck her in 2 months time to ensure her edema is improving and her skin is  intact.    Barbie Banner, PA-C [facility]  [facility]

## 2019-06-22 DIAGNOSIS — I10 Essential (primary) hypertension: Secondary | ICD-10-CM | POA: Diagnosis not present

## 2019-06-22 DIAGNOSIS — Z86718 Personal history of other venous thrombosis and embolism: Secondary | ICD-10-CM | POA: Diagnosis not present

## 2019-06-22 DIAGNOSIS — I872 Venous insufficiency (chronic) (peripheral): Secondary | ICD-10-CM | POA: Diagnosis not present

## 2019-06-22 DIAGNOSIS — Z9181 History of falling: Secondary | ICD-10-CM | POA: Diagnosis not present

## 2019-06-22 DIAGNOSIS — M48062 Spinal stenosis, lumbar region with neurogenic claudication: Secondary | ICD-10-CM | POA: Diagnosis not present

## 2019-06-22 DIAGNOSIS — I890 Lymphedema, not elsewhere classified: Secondary | ICD-10-CM | POA: Diagnosis not present

## 2019-06-22 DIAGNOSIS — Z7901 Long term (current) use of anticoagulants: Secondary | ICD-10-CM | POA: Diagnosis not present

## 2019-06-22 DIAGNOSIS — M1711 Unilateral primary osteoarthritis, right knee: Secondary | ICD-10-CM | POA: Diagnosis not present

## 2019-06-22 DIAGNOSIS — L97221 Non-pressure chronic ulcer of left calf limited to breakdown of skin: Secondary | ICD-10-CM | POA: Diagnosis not present

## 2019-06-25 DIAGNOSIS — I10 Essential (primary) hypertension: Secondary | ICD-10-CM | POA: Diagnosis not present

## 2019-06-25 DIAGNOSIS — Z86718 Personal history of other venous thrombosis and embolism: Secondary | ICD-10-CM | POA: Diagnosis not present

## 2019-06-25 DIAGNOSIS — Z7901 Long term (current) use of anticoagulants: Secondary | ICD-10-CM | POA: Diagnosis not present

## 2019-06-25 DIAGNOSIS — I890 Lymphedema, not elsewhere classified: Secondary | ICD-10-CM | POA: Diagnosis not present

## 2019-06-25 DIAGNOSIS — M48062 Spinal stenosis, lumbar region with neurogenic claudication: Secondary | ICD-10-CM | POA: Diagnosis not present

## 2019-06-25 DIAGNOSIS — M1711 Unilateral primary osteoarthritis, right knee: Secondary | ICD-10-CM | POA: Diagnosis not present

## 2019-06-25 DIAGNOSIS — I872 Venous insufficiency (chronic) (peripheral): Secondary | ICD-10-CM | POA: Diagnosis not present

## 2019-06-25 DIAGNOSIS — L97221 Non-pressure chronic ulcer of left calf limited to breakdown of skin: Secondary | ICD-10-CM | POA: Diagnosis not present

## 2019-06-25 DIAGNOSIS — Z9181 History of falling: Secondary | ICD-10-CM | POA: Diagnosis not present

## 2019-06-26 ENCOUNTER — Telehealth: Payer: Self-pay

## 2019-06-26 NOTE — Telephone Encounter (Signed)
Pt is calling she need Vit D sent to [facility] --Pt was started on this in Jan

## 2019-06-27 ENCOUNTER — Other Ambulatory Visit: Payer: Self-pay | Admitting: *Deleted

## 2019-06-27 MED ORDER — ERGOCALCIFEROL 1.25 MG (50000 UT) PO CAPS: 50000.0000 [IU] | ORAL_CAPSULE | ORAL | 1 refills | Status: DC

## 2019-06-27 NOTE — Telephone Encounter (Signed)
Refill sent to [place]

## 2019-07-05 DIAGNOSIS — M1711 Unilateral primary osteoarthritis, right knee: Secondary | ICD-10-CM | POA: Diagnosis not present

## 2019-07-05 DIAGNOSIS — Z7901 Long term (current) use of anticoagulants: Secondary | ICD-10-CM | POA: Diagnosis not present

## 2019-07-05 DIAGNOSIS — Z9181 History of falling: Secondary | ICD-10-CM | POA: Diagnosis not present

## 2019-07-05 DIAGNOSIS — I890 Lymphedema, not elsewhere classified: Secondary | ICD-10-CM | POA: Diagnosis not present

## 2019-07-05 DIAGNOSIS — Z86718 Personal history of other venous thrombosis and embolism: Secondary | ICD-10-CM | POA: Diagnosis not present

## 2019-07-05 DIAGNOSIS — I872 Venous insufficiency (chronic) (peripheral): Secondary | ICD-10-CM | POA: Diagnosis not present

## 2019-07-05 DIAGNOSIS — L97221 Non-pressure chronic ulcer of left calf limited to breakdown of skin: Secondary | ICD-10-CM | POA: Diagnosis not present

## 2019-07-05 DIAGNOSIS — I10 Essential (primary) hypertension: Secondary | ICD-10-CM | POA: Diagnosis not present

## 2019-07-05 DIAGNOSIS — M48062 Spinal stenosis, lumbar region with neurogenic claudication: Secondary | ICD-10-CM | POA: Diagnosis not present

## 2019-07-14 ENCOUNTER — Emergency Department (HOSPITAL_COMMUNITY): Payer: Medicare HMO

## 2019-07-14 ENCOUNTER — Other Ambulatory Visit: Payer: Self-pay

## 2019-07-14 ENCOUNTER — Emergency Department (HOSPITAL_COMMUNITY)
Admission: EM | Admit: 2019-07-14 | Discharge: 2019-07-14 | Disposition: A | Discharge status: Home / Self Care | Payer: Medicare HMO | Attending: Emergency Medicine | Admitting: Emergency Medicine

## 2019-07-14 DIAGNOSIS — M25062 Hemarthrosis, left knee: Secondary | ICD-10-CM | POA: Insufficient documentation

## 2019-07-14 DIAGNOSIS — Z9049 Acquired absence of other specified parts of digestive tract: Secondary | ICD-10-CM | POA: Insufficient documentation

## 2019-07-14 DIAGNOSIS — M25562 Pain in left knee: Secondary | ICD-10-CM | POA: Diagnosis present

## 2019-07-14 DIAGNOSIS — I10 Essential (primary) hypertension: Secondary | ICD-10-CM | POA: Diagnosis not present

## 2019-07-14 DIAGNOSIS — R52 Pain, unspecified: Secondary | ICD-10-CM | POA: Diagnosis not present

## 2019-07-14 DIAGNOSIS — Z7901 Long term (current) use of anticoagulants: Secondary | ICD-10-CM | POA: Insufficient documentation

## 2019-07-14 DIAGNOSIS — Z79899 Other long term (current) drug therapy: Secondary | ICD-10-CM | POA: Diagnosis not present

## 2019-07-14 DIAGNOSIS — M7989 Other specified soft tissue disorders: Secondary | ICD-10-CM | POA: Diagnosis not present

## 2019-07-14 DIAGNOSIS — Z8542 Personal history of malignant neoplasm of other parts of uterus: Secondary | ICD-10-CM | POA: Diagnosis not present

## 2019-07-14 DIAGNOSIS — M25462 Effusion, left knee: Secondary | ICD-10-CM | POA: Diagnosis not present

## 2019-07-14 DIAGNOSIS — R609 Edema, unspecified: Secondary | ICD-10-CM | POA: Diagnosis not present

## 2019-07-14 MED ORDER — LIDOCAINE-EPINEPHRINE (PF) 2 %-1:200000 IJ SOLN
INTRAMUSCULAR | Status: AC
  Filled 2019-07-14: qty 20

## 2019-07-14 NOTE — Discharge Instructions (Addendum)
You were seen today for left knee pain.  When we took out fluid from the knee it does look bloody.  Make sure to keep your knee tightly wrapped until you see your orthopedist.  I want you to see your orthopedist as soon as possible.  Come back to the emergency department if you have fevers, chills, trouble walking, severe pain in the knee.  Take Tylenol as directed on the bottle as needed for pain management in that knee.

## 2019-07-14 NOTE — ED Provider Notes (Addendum)
[facility] EMERGENCY DEPARTMENT Provider Note   CSN: [id number] Arrival date & time: 07/14/19  1922     History Chief Complaint  Patient presents with  . Knee Pain    left knee pain    [name] [name] is a 86 y.o. female with a pertinent past medical history of hypertension, arthritis, myopathy, DVT (on Eliquis) presents to the emergency department for left knee pain.  She states that her knee was fine this morning, however as the day progressed she states that her knee became weaker and weaker.  She also states that her knee has been swelling progressively throughout the day.  She denies any falls or trauma to the knee.  This is never happened before.  She states that she had pain in her knee and rates 7 out of 10 and describes it as sharp pain.  She states that she took Tylenol which did provide relief, she is not in any pain or pain right now.  She lives at home and is able to get around on her walker, however today when she called EMS she states that she was so weak she could not stand up due to her left knee pain.  She does have arthritis in her right knee however she states she is never had arthritis in her left knee.  She denies any swelling or pain in her ankle or hip.  She denies any paresthesias.  Patient is on Eliquis due to previous DVT in left leg in November 2020.  She denies any shortness of breath, chest pain, fevers, trouble breathing, nausea, vomiting, rashes.  HPI     Past Medical History:  Diagnosis Date  . Arthritis    spinal stenosis  . Elevated LFTs    secondary to fatty liver, negative work-up in 2011  . Hypercholesterolemia   . Hypertension   . Lumbar spinal stenosis 01/14/2018   L3-4 and L4-5  . Myopathy 02/15/2018  . Osteoarthritis    right knee  . Peripheral neuropathy 07/06/2018  . PONV (postoperative nausea and vomiting)   . Uterine cancer ([place]) 08/2006   grade 1, no recurrence up to 2013    Patient Active Problem List   Diagnosis Date Noted  .  Right thigh pain 05/15/2019  . Venous stasis ulcers ([place]) 04/03/2019  . Blister 03/24/2019  . Lower leg edema 03/22/2019  . At high risk for falls per [facility] fall risk assessment scale 02/09/2019  . Acute deep vein thrombosis (DVT) of left femoral vein ([place]) 02/07/2019  . Neck pain, bilateral posterior 01/13/2019  . Statins contraindicated 08/14/2018  . Educated about COVID-19 virus infection 08/14/2018  . Peripheral neuropathy 07/06/2018  . Dysphagia 05/03/2018  . 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 Lumbar spinal stenosis 01/14/2018  . Abnormal weight loss 07/29/2017  . GERD (gastroesophageal reflux disease) 06/17/2016  . Anemia   . Annual physical exam 11/29/2014  . 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 Constipation 10/04/2012  . Urinary incontinence 06/20/2012  . Vitamin D deficiency 08/22/2011  . Prediabetes 05/29/2011  . NASH (nonalcoholic steatohepatitis) 11/05/2009  . Arthritis of right knee 09/04/2009  . Hyperlipidemia LDL goal <100 09/08/2007  . Essential hypertension 09/08/2007    Past Surgical History:  Procedure Laterality Date  . 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 APPENDECTOMY  1973  . BREAST BIOPSY Left 2018   Benign  . CHOLECYSTECTOMY  1973  . COLONOSCOPY    06/21/2007  EPP:IRJJOA rectum/Sigmoid diverticula, diminutive hepatic flexure polyp s/p bx. Benign.   . COLONOSCOPY N/A 10/17/2012   Dr. Oneida Alar- moderate diverticulosis was noted in the sigmoid colon, moderate sized internal hemorrhoids. next tcs in10 years  . ESOPHAGOGASTRODUODENOSCOPY N/A 03/20/2016   Dr. Oneida Alar, widely patent Schatzki ring, anemia due to ASA induced erosive gastritis, mild duodenitis. gastric bx benign without H.pylori.   Marland Kitchen FOOT SURGERY  2007   Pins in toes on left foot, 5 to 6 yrs ago  . GIVENS CAPSULE STUDY N/A 03/20/2016   Procedure: GIVENS CAPSULE STUDY;  Surgeon: Danie Binder, MD;  [facility];   Service: Endoscopy;  Laterality: N/A;  . MUSCLE BIOPSY Left 06/07/2018   Procedure: LEFT THIGH MUSCLE BIOPSY;  Surgeon: Erroll Luna, MD;  Location: [facility];  Service: General;  Laterality: Left;  . TONSILLECTOMY       OB History   No obstetric history on file.     Family History  Problem Relation Age of Onset  . Heart disease Father   . Bladder Cancer Brother        in remission   . Hypertension Sister   . Dementia Sister   . Diabetes type II Sister   . Hypertension Sister   . Aneurysm Sister   . Hypertension Brother   . Arthritis Brother   . Hypertension Son   . Colon cancer Neg Hx     Social History   Tobacco Use  . Smoking status: Never Smoker  . Smokeless tobacco: Never Used  . Tobacco comment: Never smoked  Substance Use Topics  . Alcohol use: No  . Drug use: No    Home Medications Prior to Admission medications   Medication Sig Start Date End Date Taking? Authorizing Provider  acetaminophen (TYLENOL) 500 MG tablet Take 500 mg by mouth every 6 (six) hours as needed.    [provider]  apixaban (ELIQUIS) 5 MG TABS tablet Take 1 tablet (5 mg total) by mouth 2 (two) times daily. 06/13/19   Derek Jack, MD  ergocalciferol (VITAMIN D2) 1.25 MG (50000 UT) capsule Take 1 capsule (50,000 Units total) by mouth once a week. One capsule once weekly  Started takinjg in January 2021, will take for 6 months only and re check) 06/27/19   Fayrene Helper, MD  ezetimibe (ZETIA) 10 MG tablet TAKE 1 TABLET EVERY DAY 05/12/19   Perlie Mayo, NP  folic acid (FOLVITE) 1 MG tablet Take 1 tablet (1 mg total) by mouth daily. 08/10/18   Kathrynn Ducking, MD  gabapentin (NEURONTIN) 100 MG capsule Take 1 capsule (100 mg total) by mouth at bedtime. 01/09/19   Fayrene Helper, MD  LINZESS 145 MCG CAPS capsule Take 145 mcg by mouth as needed.  02/14/19   [provider]  triamterene-hydrochlorothiazide (MAXZIDE) 75-50 MG tablet TAKE 1  TABLET EVERY DAY 03/23/19   Fayrene Helper, MD  UNABLE TO FIND Lift chair x 1  DX M48.06, M53.87 10/18/18   Fayrene Helper, MD    Allergies    Cellcept [mycophenolate mofetil], Statins, Ciprofloxacin hcl, Hydrocodone, and Penicillins  Review of Systems   Review of Systems  Constitutional: Negative for chills, fatigue and fever.  HENT: Negative for congestion, sore throat and voice change.   Eyes: Negative for pain.  Respiratory: Negative for shortness of breath.   Cardiovascular: Negative for chest pain, palpitations and leg swelling.  Gastrointestinal: Negative for abdominal distention, abdominal pain, diarrhea, nausea and vomiting.  Genitourinary: Negative for dysuria.  Musculoskeletal: Positive for arthralgias (left knee) and joint swelling. Negative for back pain.  Skin: Negative for rash and wound.  Neurological: Negative for speech difficulty, light-headedness, numbness and headaches.  Psychiatric/Behavioral: Negative for agitation and confusion. The patient is not nervous/anxious.     Physical Exam Updated Vital Signs BP (!) 135/58 (BP Location: Left Arm)   Pulse 89   Temp 98 F (36.7 C) (Oral)   Resp 18   Ht 5' 6"  (1.676 m)   Wt 68 kg   SpO2 100%   BMI 24.21 kg/m   Physical Exam .PE: Constitutional: well-developed, well-nourished, no apparent distress HENT: normocephalic, atraumatic Cardiovascular: normal rate and rhythm, distal pulses intact Pulmonary/Chest: effort normal; breath sounds clear and equal bilaterally; no wheezes or rales Abdominal: soft and nontender Musculoskeletal: Left knee with moderate swelling, no erythema or warmth noted.  No tenderness on knee during exam.  Able to move knee, but with decreased strength. Left foot and leg with 2+ pitting edema (patient is wearing compression stockings for this and states that this is unchanged for many months due to her blisters bilaterally).  Right knee without tenderness, right foot and leg with 2+  pitting edema (compression stockings also in place). Normal strength of right knee. Distal pulses 2+ bilaterally. Ankles and hips bilaterally with normal strength. Lymphadenopathy: no cervical adenopathy Neurological: alert with goal directed thinking, Sensation intact bilaterally on lower extremities.  Skin: warm and dry, no rash, no diaphoresis Psychiatric: normal mood and affect, normal behavior     ED Results / Procedures / Treatments   Labs (all labs ordered are listed, but only abnormal results are displayed) Labs Reviewed  BODY FLUID CULTURE    EKG None  Radiology DG Knee Complete 4 Views Left  Result Date: 07/14/2019 CLINICAL DATA:  Left knee pain and swelling, onset this morning. EXAM: LEFT KNEE - COMPLETE 4+ VIEW COMPARISON:  None. FINDINGS: No fracture or dislocation. Tricompartmental osteoarthritis with peripheral spurring and medial tibiofemoral joint space narrowing. There is chondrocalcinosis medially and laterally. No erosion or bony destruction. No periosteal reaction. Moderate joint effusion. There is generalized soft tissue edema. IMPRESSION: 1. Tricompartmental osteoarthritis with chondrocalcinosis. Moderate joint effusion. No acute bony abnormality. 2. Generalized soft tissue edema. Electronically Signed   By: Keith Rake M.D.   On: 07/14/2019 21:14    Procedures .Joint Aspiration/Arthrocentesis  Date/Time: 07/14/2019 10:54 PM Performed by: Alfredia Client, PA-C Authorized by: Alfredia Client, PA-C   Consent:    Consent obtained:  Verbal   Consent given by:  Patient   Risks discussed:  Bleeding, infection, pain and incomplete drainage   Alternatives discussed:  No treatment and observation Location:    Location:  Knee   Knee:  L knee Anesthesia (see MAR for exact dosages):    Anesthesia method:  Local infiltration   Local anesthetic:  Lidocaine 1% w/o epi Procedure details:    Preparation: Patient was prepped and draped in usual sterile fashion      Needle gauge:  18 G   Ultrasound guidance: no     Approach:  Lateral   Aspirate amount:  4cc   Aspirate characteristics:  Blood-tinged and bloody   Steroid injected: no     Specimen collected: yes   Post-procedure details:    Dressing:  Adhesive bandage   Patient tolerance of procedure:  Tolerated well, no immediate complications   (including critical care time)  Medications Ordered in ED Medications  lidocaine-EPINEPHrine (XYLOCAINE W/EPI) 2 %-1:200000 (PF)  injection (has no administration in time range)    ED Course  I have reviewed the triage vital signs and the nursing notes.  Pertinent labs & imaging results that were available during my care of the patient were reviewed by me and considered in my medical decision making (see chart for details).    MDM Rules/Calculators/A&P                     BRYELLA DIVINEY is a 86 y.o. female.  With a pertinent past medical history of hypertension, arthritis, myopathy, DVT (on Eliquis) presents to the emergency department for left knee pain.  No trauma to the knee.  Left knee is swollen, no warmth or erythema noted.  She is unable to move her knee due to weakness.  Concerned of hemarthrosis due to Eliquis.  Differential to also include arthritis, septic knee, gout, pseudogout, hemoarthrosis, fracture.  Knee x-ray ordered.  Knee xray showed moderate joint effusion. I preformed a knee arthrocentesis (See procedure note above) and bloody fluid was drained. Hemarthrosis most likely due to pt's Eliquis. Explained this to pt and need for orthopedist follow up. Pt agreeable. [facility] send synovial fluid for cell culture. Do not suspect infection. Pt stable with no fever. Pt to be discharged. ER return precautions given - worsening knee pain, fevers, nausea, vomiting, shortness of breath, chest pain.   I discussed this case with my attending physician, Dr. Sabra Heck, who cosigned this note including patient's presenting symptoms, physical exam, and planned  diagnostics and interventions. Attending physician stated agreement with plan or made changes to plan which were implemented.   Attending physician assessed patient at bedside.  Final Clinical Impression(s) / ED Diagnoses Final diagnoses:  Hemarthrosis of left knee    Rx / DC Orders ED Discharge Orders    None       Alfredia Client, PA-C 07/15/19 [place], Montgomery Rothlisberger, PA-C 07/15/19 0136    Noemi Chapel, MD 07/15/19 [phone]

## 2019-07-14 NOTE — ED Triage Notes (Signed)
Patient complains of left knee pain. Patient states left knee swelling that started this morning. Patient does have a history of DVT in the left leg. Knee is obviously swollen.

## 2019-07-18 LAB — BODY FLUID CULTURE: Culture: NO GROWTH

## 2019-07-24 ENCOUNTER — Ambulatory Visit: Payer: Medicare HMO | Admitting: Orthopedic Surgery

## 2019-07-24 ENCOUNTER — Encounter: Payer: Self-pay | Admitting: Orthopedic Surgery

## 2019-07-24 ENCOUNTER — Telehealth: Payer: Self-pay

## 2019-07-24 ENCOUNTER — Other Ambulatory Visit: Payer: Self-pay

## 2019-07-24 VITALS — Temp 97.3°F | Wt 145.0 lb

## 2019-07-24 DIAGNOSIS — M25062 Hemarthrosis, left knee: Secondary | ICD-10-CM

## 2019-07-24 NOTE — Progress Notes (Signed)
EMERGENCY ROOM FOLLOW UP  NEW PROBLEM/PATIENT   Patient ID: [name] [name], female   DOB: 1932-04-24, 86 y.o.   MRN: [id number]  Emergency room record from (date) 4.23 has been reviewed and this is included by reference and includes the review of systems with the following addition:   Chief Complaint  Patient presents with  . Knee Pain    left knee pain, hx DVT left leg, on BT's    HPI [name] [name] is a 86 y.o. female.  Presents for evaluation post hemarthrosis from Eliquis left knee drained in the ER  A 64-year-old female history of spinal stenosis developed a DVT had to be placed on Eliquis then had a acute episode of knee pain and swelling presented to the ER for evaluation and treatment which revealed she had a hemarthrosis it was drained she was sent here for post ER follow-up.  She says that the knee has gotten better although it still feels a little funny at times and she felt some instability in it today when she got off the porch   Review of Systems Review of Systems  Constitutional: Negative for activity change and appetite change.  Musculoskeletal: Positive for back pain and gait problem.       has a past medical history of Arthritis, Elevated LFTs, Hypercholesterolemia, Hypertension, Lumbar spinal stenosis (01/14/2018), Myopathy (02/15/2018), Osteoarthritis, Peripheral neuropathy (07/06/2018), PONV (postoperative nausea and vomiting), and Uterine cancer ([place]) (08/2006).   Past Surgical History:  Procedure Laterality Date  . 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 APPENDECTOMY  1973  . BREAST BIOPSY Left 2018   Benign  . CHOLECYSTECTOMY  1973  . COLONOSCOPY    06/21/2007   [id number] rectum/Sigmoid diverticula, diminutive hepatic flexure polyp s/p bx. Benign.   . COLONOSCOPY N/A 10/17/2012   Dr. Oneida Alar- moderate diverticulosis was noted in the sigmoid colon, moderate sized internal hemorrhoids. next tcs in10 years  . ESOPHAGOGASTRODUODENOSCOPY N/A  03/20/2016   Dr. Oneida Alar, widely patent Schatzki ring, anemia due to ASA induced erosive gastritis, mild duodenitis. gastric bx benign without H.pylori.   Marland Kitchen FOOT SURGERY  2007   Pins in toes on left foot, 5 to 6 yrs ago  . GIVENS CAPSULE STUDY N/A 03/20/2016   Procedure: GIVENS CAPSULE STUDY;  Surgeon: Danie Binder, MD;  [facility];  Service: Endoscopy;  Laterality: N/A;  . MUSCLE BIOPSY Left 06/07/2018   Procedure: LEFT THIGH MUSCLE BIOPSY;  Surgeon: Erroll Luna, MD;  Location: [facility];  Service: General;  Laterality: Left;  . TONSILLECTOMY      Family History  Problem Relation Age of Onset  . Heart disease Father   . Bladder Cancer Brother        in remission   . Hypertension Sister   . Dementia Sister   . Diabetes type II Sister   . Hypertension Sister   . Aneurysm Sister   . Hypertension Brother   . Arthritis Brother   . Hypertension Son   . Colon cancer Neg Hx     Social History Social History   Tobacco Use  . Smoking status: Never Smoker  . Smokeless tobacco: Never Used  . Tobacco comment: Never smoked  Substance Use Topics  . Alcohol use: No  . Drug use: No    Allergies  Allergen Reactions  . Cellcept [Mycophenolate Mofetil]     Muscle cramps, mouth soreness, difficulty swallowing, bruising  . Statins     Dx  of in  . Ciprofloxacin Hcl Other (See Comments)    Chills, sick, could not tolerate it  . Hydrocodone Nausea Only  . Penicillins Rash    Current Outpatient Medications  Medication Sig Dispense Refill  . acetaminophen (TYLENOL) 500 MG tablet Take 500 mg by mouth every 6 (six) hours as needed.    Marland Kitchen apixaban (ELIQUIS) 5 MG TABS tablet Take 1 tablet (5 mg total) by mouth 2 (two) times daily. 180 tablet 0  . ergocalciferol (VITAMIN D2) 1.25 MG (50000 UT) capsule Take 1 capsule (50,000 Units total) by mouth once a week. One capsule once weekly  Started takinjg in January 2021, will take for 6 months only and re check) 12  capsule 1  . ezetimibe (ZETIA) 10 MG tablet TAKE 1 TABLET EVERY DAY 90 tablet 3  . folic acid (FOLVITE) 1 MG tablet Take 1 tablet (1 mg total) by mouth daily. 90 tablet 3  . gabapentin (NEURONTIN) 100 MG capsule Take 1 capsule (100 mg total) by mouth at bedtime. 90 capsule 3  . LINZESS 145 MCG CAPS capsule Take 145 mcg by mouth as needed.     . triamterene-hydrochlorothiazide (MAXZIDE) 75-50 MG tablet TAKE 1 TABLET EVERY DAY 90 tablet 1  . UNABLE TO FIND Lift chair x 1  DX M48.06, M53.87 1 each 0   No current facility-administered medications for this visit.    Physical Exam Temp (!) 97.3 F (36.3 C)   Wt 145 lb (65.8 kg)   BMI 23.40 kg/m  Body mass index is 23.4 kg/m.  Well developed and well nourished  Ambulation with a rolling walker Alert and oriented x 3  Normal affect and mood  Ortho Exam Left knee still has a small effusion she has a flexion contracture of 10 degrees she can flex the knee 115 degrees feels stable muscle tone is normal she has adequate extension power.  She has a large bruise on the inside of the left thigh from subcutaneous bleeding from her Eliquis    Data Reviewed IMAGING From THE ER AND THE REPORT ARE REVIEWED, MY INTERPRETATION OF THE IMAGE(S) IS :  4 views left knee large area of chondrocalcinosis lateral compartment, normal alignment narrowing of the medial joint space large effusion Assessment  Hemarthrosis left knee  Plan Encounter Diagnosis  Name Primary?  . Hemarthrosis of left knee Yes     Bracing for support Should resolve in another 2 to 3 weeks  Follow-up as needed Acute complicated by evidence of the Eliquis and bleeding Arther Abbott, MD 07/24/2019 3:30 PM

## 2019-07-24 NOTE — Telephone Encounter (Signed)
Patient requesting refill of lasix. Not on current med list but was sent in on Dec 22 for 20 tabs to take as needed

## 2019-07-25 ENCOUNTER — Other Ambulatory Visit: Payer: Self-pay | Admitting: Family Medicine

## 2019-07-25 MED ORDER — FUROSEMIDE 20 MG PO TABS: ORAL_TABLET | ORAL | 1 refills | Status: DC

## 2019-07-25 NOTE — Progress Notes (Signed)
Lasix 20

## 2019-07-25 NOTE — Telephone Encounter (Signed)
Script sent to [facility], please let her know,. Only one tablet two times weekly , if needed, is prescribed

## 2019-07-26 ENCOUNTER — Ambulatory Visit: Payer: Medicare HMO | Admitting: Neurology

## 2019-07-26 NOTE — Telephone Encounter (Signed)
Patient aware.

## 2019-08-04 ENCOUNTER — Inpatient Hospital Stay (HOSPITAL_COMMUNITY)
Admission: EM | Admit: 2019-08-04 | Discharge: 2019-08-07 | DRG: 565 | Disposition: A | Discharge status: Skilled Nursing Facility | Payer: Medicare HMO | Attending: Family Medicine | Admitting: Family Medicine

## 2019-08-04 ENCOUNTER — Emergency Department (HOSPITAL_COMMUNITY): Payer: Medicare HMO

## 2019-08-04 ENCOUNTER — Encounter (HOSPITAL_COMMUNITY): Payer: Self-pay

## 2019-08-04 ENCOUNTER — Other Ambulatory Visit: Payer: Self-pay

## 2019-08-04 DIAGNOSIS — I82502 Chronic embolism and thrombosis of unspecified deep veins of left lower extremity: Secondary | ICD-10-CM | POA: Diagnosis not present

## 2019-08-04 DIAGNOSIS — R609 Edema, unspecified: Secondary | ICD-10-CM | POA: Diagnosis not present

## 2019-08-04 DIAGNOSIS — R52 Pain, unspecified: Secondary | ICD-10-CM | POA: Diagnosis not present

## 2019-08-04 DIAGNOSIS — Z888 Allergy status to other drugs, medicaments and biological substances status: Secondary | ICD-10-CM

## 2019-08-04 DIAGNOSIS — E7800 Pure hypercholesterolemia, unspecified: Secondary | ICD-10-CM | POA: Diagnosis present

## 2019-08-04 DIAGNOSIS — D649 Anemia, unspecified: Secondary | ICD-10-CM | POA: Diagnosis not present

## 2019-08-04 DIAGNOSIS — M7989 Other specified soft tissue disorders: Secondary | ICD-10-CM | POA: Diagnosis not present

## 2019-08-04 DIAGNOSIS — M25462 Effusion, left knee: Secondary | ICD-10-CM | POA: Diagnosis not present

## 2019-08-04 DIAGNOSIS — E785 Hyperlipidemia, unspecified: Secondary | ICD-10-CM | POA: Diagnosis present

## 2019-08-04 DIAGNOSIS — G609 Hereditary and idiopathic neuropathy, unspecified: Secondary | ICD-10-CM | POA: Diagnosis not present

## 2019-08-04 DIAGNOSIS — Z20822 Contact with and (suspected) exposure to covid-19: Secondary | ICD-10-CM | POA: Diagnosis present

## 2019-08-04 DIAGNOSIS — R600 Localized edema: Secondary | ICD-10-CM | POA: Diagnosis present

## 2019-08-04 DIAGNOSIS — K5900 Constipation, unspecified: Secondary | ICD-10-CM | POA: Diagnosis present

## 2019-08-04 DIAGNOSIS — K5909 Other constipation: Secondary | ICD-10-CM | POA: Diagnosis present

## 2019-08-04 DIAGNOSIS — Z7901 Long term (current) use of anticoagulants: Secondary | ICD-10-CM | POA: Diagnosis not present

## 2019-08-04 DIAGNOSIS — E876 Hypokalemia: Secondary | ICD-10-CM | POA: Diagnosis present

## 2019-08-04 DIAGNOSIS — E8809 Other disorders of plasma-protein metabolism, not elsewhere classified: Secondary | ICD-10-CM | POA: Diagnosis not present

## 2019-08-04 DIAGNOSIS — Z880 Allergy status to penicillin: Secondary | ICD-10-CM

## 2019-08-04 DIAGNOSIS — Z79899 Other long term (current) drug therapy: Secondary | ICD-10-CM

## 2019-08-04 DIAGNOSIS — M48061 Spinal stenosis, lumbar region without neurogenic claudication: Secondary | ICD-10-CM | POA: Diagnosis present

## 2019-08-04 DIAGNOSIS — Z8542 Personal history of malignant neoplasm of other parts of uterus: Secondary | ICD-10-CM | POA: Diagnosis not present

## 2019-08-04 DIAGNOSIS — I959 Hypotension, unspecified: Secondary | ICD-10-CM | POA: Diagnosis not present

## 2019-08-04 DIAGNOSIS — I82442 Acute embolism and thrombosis of left tibial vein: Secondary | ICD-10-CM | POA: Diagnosis not present

## 2019-08-04 DIAGNOSIS — G629 Polyneuropathy, unspecified: Secondary | ICD-10-CM | POA: Diagnosis present

## 2019-08-04 DIAGNOSIS — K219 Gastro-esophageal reflux disease without esophagitis: Secondary | ICD-10-CM | POA: Diagnosis present

## 2019-08-04 DIAGNOSIS — M25062 Hemarthrosis, left knee: Secondary | ICD-10-CM | POA: Diagnosis not present

## 2019-08-04 DIAGNOSIS — Z8249 Family history of ischemic heart disease and other diseases of the circulatory system: Secondary | ICD-10-CM

## 2019-08-04 DIAGNOSIS — Z885 Allergy status to narcotic agent status: Secondary | ICD-10-CM

## 2019-08-04 DIAGNOSIS — Z8541 Personal history of malignant neoplasm of cervix uteri: Secondary | ICD-10-CM

## 2019-08-04 DIAGNOSIS — M25461 Effusion, right knee: Secondary | ICD-10-CM | POA: Diagnosis not present

## 2019-08-04 DIAGNOSIS — M6281 Muscle weakness (generalized): Secondary | ICD-10-CM | POA: Diagnosis not present

## 2019-08-04 DIAGNOSIS — Z9049 Acquired absence of other specified parts of digestive tract: Secondary | ICD-10-CM

## 2019-08-04 DIAGNOSIS — I82432 Acute embolism and thrombosis of left popliteal vein: Secondary | ICD-10-CM | POA: Diagnosis not present

## 2019-08-04 DIAGNOSIS — I10 Essential (primary) hypertension: Secondary | ICD-10-CM | POA: Diagnosis present

## 2019-08-04 DIAGNOSIS — R262 Difficulty in walking, not elsewhere classified: Secondary | ICD-10-CM | POA: Diagnosis not present

## 2019-08-04 DIAGNOSIS — I82532 Chronic embolism and thrombosis of left popliteal vein: Secondary | ICD-10-CM | POA: Diagnosis not present

## 2019-08-04 DIAGNOSIS — I82402 Acute embolism and thrombosis of unspecified deep veins of left lower extremity: Secondary | ICD-10-CM | POA: Diagnosis present

## 2019-08-04 DIAGNOSIS — J811 Chronic pulmonary edema: Secondary | ICD-10-CM | POA: Diagnosis not present

## 2019-08-04 DIAGNOSIS — R5381 Other malaise: Secondary | ICD-10-CM | POA: Diagnosis not present

## 2019-08-04 HISTORY — DX: Acute embolism and thrombosis of unspecified deep veins of left lower extremity: I82.402

## 2019-08-04 LAB — BASIC METABOLIC PANEL
Anion gap: 9 (ref 5–15)
BUN: 33 mg/dL — ABNORMAL HIGH (ref 8–23)
CO2: 28 mmol/L (ref 22–32)
Calcium: 8.9 mg/dL (ref 8.9–10.3)
Chloride: 105 mmol/L (ref 98–111)
Creatinine, Ser: 0.58 mg/dL (ref 0.44–1.00)
GFR calc Af Amer: >60 mL/min (ref >60)
GFR calc non Af Amer: >60 mL/min (ref >60)
Glucose, Bld: 125 mg/dL — ABNORMAL HIGH (ref 70–99)
Potassium: 3.2 mmol/L — ABNORMAL LOW (ref 3.5–5.1)
Sodium: 142 mmol/L (ref 135–145)

## 2019-08-04 LAB — CBC WITH DIFFERENTIAL/PLATELET
Abs Immature Granulocytes: 0.02 10*3/uL (ref 0.00–0.07)
Basophils Absolute: 0 10*3/uL (ref 0.0–0.1)
Basophils Relative: 0 %
Eosinophils Absolute: 0 10*3/uL (ref 0.0–0.5)
Eosinophils Relative: 1 %
HCT: 29.2 % — ABNORMAL LOW (ref 36.0–46.0)
Hemoglobin: 8.9 g/dL — ABNORMAL LOW (ref 12.0–15.0)
Immature Granulocytes: 0 %
Lymphocytes Relative: 13 %
Lymphs Abs: 0.9 10*3/uL (ref 0.7–4.0)
MCH: 29.8 pg (ref 26.0–34.0)
MCHC: 30.5 g/dL (ref 30.0–36.0)
MCV: 97.7 fL (ref 80.0–100.0)
Monocytes Absolute: 0.6 10*3/uL (ref 0.1–1.0)
Monocytes Relative: 10 %
Neutro Abs: 5 10*3/uL (ref 1.7–7.7)
Neutrophils Relative %: 76 %
Platelets: 228 10*3/uL (ref 150–400)
RBC: 2.99 MIL/uL — ABNORMAL LOW (ref 3.87–5.11)
RDW: 13.9 % (ref 11.5–15.5)
WBC: 6.6 10*3/uL (ref 4.0–10.5)
nRBC: 0 % (ref 0.0–0.2)

## 2019-08-04 LAB — HEPATIC FUNCTION PANEL
ALT: 31 U/L (ref 0–44)
AST: 44 U/L — ABNORMAL HIGH (ref 15–41)
Albumin: 3.2 g/dL — ABNORMAL LOW (ref 3.5–5.0)
Alkaline Phosphatase: 59 U/L (ref 38–126)
Bilirubin, Direct: 0.1 mg/dL (ref 0.0–0.2)
Indirect Bilirubin: 0.8 mg/dL (ref 0.3–0.9)
Total Bilirubin: 0.9 mg/dL (ref 0.3–1.2)
Total Protein: 6 g/dL — ABNORMAL LOW (ref 6.5–8.1)

## 2019-08-04 LAB — SARS CORONAVIRUS 2 BY RT PCR (HOSPITAL ORDER, PERFORMED IN ~~LOC~~ HOSPITAL LAB): SARS Coronavirus 2: NEGATIVE

## 2019-08-04 LAB — BRAIN NATRIURETIC PEPTIDE: B Natriuretic Peptide: 59 pg/mL (ref 0.0–100.0)

## 2019-08-04 MED ORDER — PROCHLORPERAZINE EDISYLATE 10 MG/2ML IJ SOLN: 5.0000 mg | Freq: Four times a day (QID) | INTRAMUSCULAR | Status: DC | PRN

## 2019-08-04 MED ORDER — FOLIC ACID 1 MG PO TABS
1.0000 mg | ORAL_TABLET | Freq: Every day | ORAL | Status: DC
  Administered 2019-08-05 – 2019-08-07 (×3): 1 mg via ORAL
  Filled 2019-08-04 (×3): qty 1

## 2019-08-04 MED ORDER — GABAPENTIN 100 MG PO CAPS
100.0000 mg | ORAL_CAPSULE | Freq: Every day | ORAL | Status: DC
  Filled 2019-08-04: qty 1

## 2019-08-04 MED ORDER — ACETAMINOPHEN 650 MG RE SUPP: 650.0000 mg | Freq: Four times a day (QID) | RECTAL | Status: DC | PRN

## 2019-08-04 MED ORDER — APIXABAN 5 MG PO TABS
5.0000 mg | ORAL_TABLET | Freq: Two times a day (BID) | ORAL | Status: DC
  Administered 2019-08-05 – 2019-08-07 (×6): 5 mg via ORAL
  Filled 2019-08-04 (×6): qty 1

## 2019-08-04 MED ORDER — ACETAMINOPHEN 325 MG PO TABS
650.0000 mg | ORAL_TABLET | Freq: Four times a day (QID) | ORAL | Status: DC | PRN
  Administered 2019-08-05 – 2019-08-06 (×3): 650 mg via ORAL
  Filled 2019-08-04 (×3): qty 2

## 2019-08-04 MED ORDER — FUROSEMIDE 10 MG/ML IJ SOLN
40.0000 mg | Freq: Once | INTRAMUSCULAR | Status: AC
  Administered 2019-08-04: 40 mg via INTRAVENOUS
  Filled 2019-08-04: qty 4

## 2019-08-04 MED ORDER — POTASSIUM CHLORIDE CRYS ER 20 MEQ PO TBCR
40.0000 meq | EXTENDED_RELEASE_TABLET | Freq: Once | ORAL | Status: AC
  Administered 2019-08-04: 40 meq via ORAL
  Filled 2019-08-04: qty 2

## 2019-08-04 MED ORDER — LINACLOTIDE 145 MCG PO CAPS: 145.0000 ug | ORAL_CAPSULE | Freq: Every day | ORAL | Status: DC | PRN

## 2019-08-04 MED ORDER — EZETIMIBE 10 MG PO TABS
10.0000 mg | ORAL_TABLET | Freq: Every day | ORAL | Status: DC
  Administered 2019-08-05 – 2019-08-07 (×3): 10 mg via ORAL
  Filled 2019-08-04 (×3): qty 1

## 2019-08-04 MED ORDER — FUROSEMIDE 10 MG/ML IJ SOLN
40.0000 mg | Freq: Every day | INTRAMUSCULAR | Status: DC
  Administered 2019-08-05 – 2019-08-07 (×3): 40 mg via INTRAVENOUS
  Filled 2019-08-04 (×3): qty 4

## 2019-08-04 NOTE — ED Notes (Signed)
Pa in room

## 2019-08-04 NOTE — ED Provider Notes (Signed)
[facility] EMERGENCY DEPARTMENT Provider Note   CSN: [id number] Arrival date & time: 08/04/19  1847     History Chief Complaint  Patient presents with  . Edema    [name] [name] is a 86 y.o. female.  [name] [name] is a 86 y.o. female with history of hypertension, hyperlipidemia, uterine cancer, lumbar spinal stenosis, peripheral neuropathy, DVT on anticoagulation, who presents to the ED for evaluation of worsening lower extremity swelling and left nipple.  Patient reports that she was seen in the ED a few weeks ago for left knee pain and swelling, and had a knee arthrocentesis done which showed blood consistent with hemarthrosis likely due to patient's anticoagulation.  She followed up with Dr. Aline Brochure with orthopedics who stated that this could take a few weeks to resolve.  She states recently the left knee has been giving her more trouble and has become a bit more swollen, it has not been red, she is able to bend and straighten the knee without significantly worsening pain and she denies fevers.  She reports between the worsening swelling in her legs and worsening pain in the left knee she has been having increasing difficulty walking, despite using her walker and now has difficulty getting up to go to the bathroom.  She was placed on Lasix by her PCP is as needed, but states despite taking this her leg swelling has gotten worse.  She has also been wearing compression stockings regularly with no improvement.  She denies associated shortness of breath or chest pain.  Reports she initially had wounds on her legs due to swelling but these have improved and not recurred.  Her son expresses concern about her being at home by herself with decreased mobility.  They tried to speak with the PCP today, but they cannot be seen until Monday, Monday they are going to work on trying to get the patient ruled in some rehab or physical therapy.        Past Medical History:  Diagnosis Date  . Arthritis     spinal stenosis  . Elevated LFTs    secondary to fatty liver, negative work-up in 2011  . Hypercholesterolemia   . Hypertension   . Lumbar spinal stenosis 01/14/2018   L3-4 and L4-5  . Myopathy 02/15/2018  . Osteoarthritis    right knee  . Peripheral neuropathy 07/06/2018  . PONV (postoperative nausea and vomiting)   . Uterine cancer ([place]) 08/2006   grade 1, no recurrence up to 2013    Patient Active Problem List   Diagnosis Date Noted  . Bilateral lower extremity edema 08/04/2019  . Deep vein thrombosis (DVT) of left lower extremity ([place]) 08/04/2019  . Hypokalemia 08/04/2019  . Hypoalbuminemia 08/04/2019  . Right thigh pain 05/15/2019  . Venous stasis ulcers ([place]) 04/03/2019  . Blister 03/24/2019  . Lower leg edema 03/22/2019  . At high risk for falls per [facility] fall risk assessment scale 02/09/2019  . Acute deep vein thrombosis (DVT) of left femoral vein ([place]) 02/07/2019  . Neck pain, bilateral posterior 01/13/2019  . Statins contraindicated 08/14/2018  . Educated about COVID-19 virus infection 08/14/2018  . Peripheral neuropathy 07/06/2018  . Dysphagia 05/03/2018  . 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 Lumbar spinal stenosis 01/14/2018  . Abnormal weight loss 07/29/2017  . GERD (gastroesophageal reflux disease) 06/17/2016  . Normocytic anemia   . Annual physical exam 11/29/2014  . 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 Constipation 10/04/2012  .  Urinary incontinence 06/20/2012  . Vitamin D deficiency 08/22/2011  . Prediabetes 05/29/2011  . NASH (nonalcoholic steatohepatitis) 11/05/2009  . Arthritis of right knee 09/04/2009  . Hyperlipidemia LDL goal <100 09/08/2007  . Essential hypertension 09/08/2007    Past Surgical History:  Procedure Laterality Date  . 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 APPENDECTOMY  1973  . BREAST BIOPSY Left 2018   Benign  . CHOLECYSTECTOMY  1973  . COLONOSCOPY     06/21/2007   [id number] rectum/Sigmoid diverticula, diminutive hepatic flexure polyp s/p bx. Benign.   . COLONOSCOPY N/A 10/17/2012   Dr. Oneida Alar- moderate diverticulosis was noted in the sigmoid colon, moderate sized internal hemorrhoids. next tcs in10 years  . ESOPHAGOGASTRODUODENOSCOPY N/A 03/20/2016   Dr. Oneida Alar, widely patent Schatzki ring, anemia due to ASA induced erosive gastritis, mild duodenitis. gastric bx benign without H.pylori.   Marland Kitchen FOOT SURGERY  2007   Pins in toes on left foot, 5 to 6 yrs ago  . GIVENS CAPSULE STUDY N/A 03/20/2016   Procedure: GIVENS CAPSULE STUDY;  Surgeon: Danie Binder, MD;  [facility];  Service: Endoscopy;  Laterality: N/A;  . MUSCLE BIOPSY Left 06/07/2018   Procedure: LEFT THIGH MUSCLE BIOPSY;  Surgeon: Erroll Luna, MD;  Location: [facility];  Service: General;  Laterality: Left;  . TONSILLECTOMY       OB History   No obstetric history on file.     Family History  Problem Relation Age of Onset  . Heart disease Father   . Bladder Cancer Brother        in remission   . Hypertension Sister   . Dementia Sister   . Diabetes type II Sister   . Hypertension Sister   . Aneurysm Sister   . Hypertension Brother   . Arthritis Brother   . Hypertension Son   . Colon cancer Neg Hx     Social History   Tobacco Use  . Smoking status: Never Smoker  . Smokeless tobacco: Never Used  . Tobacco comment: Never smoked  Substance Use Topics  . Alcohol use: No  . Drug use: No    Home Medications Prior to Admission medications   Medication Sig Start Date End Date Taking? Authorizing Provider  acetaminophen (TYLENOL) 500 MG tablet Take 500 mg by mouth every 6 (six) hours as needed.   Yes [provider]  apixaban (ELIQUIS) 5 MG TABS tablet Take 1 tablet (5 mg total) by mouth 2 (two) times daily. 06/13/19  Yes Derek Jack, MD  ergocalciferol (VITAMIN D2) 1.25 MG (50000 UT) capsule Take 1 capsule (50,000 Units  total) by mouth once a week. One capsule once weekly  Started takinjg in January 2021, will take for 6 months only and re check) 06/27/19  Yes Fayrene Helper, MD  ezetimibe (ZETIA) 10 MG tablet TAKE 1 TABLET EVERY DAY Patient taking differently: Take 10 mg by mouth daily.  05/12/19  Yes Perlie Mayo, NP  folic acid (FOLVITE) 1 MG tablet Take 1 tablet (1 mg total) by mouth daily. 08/10/18  Yes Kathrynn Ducking, MD  furosemide (LASIX) 20 MG tablet Take one tablet by mouth, two times weekly, as needed, for leg swelling Patient taking differently: Take 20 mg by mouth as directed. Take one tablet by mouth, two times weekly, as needed, for leg swelling 07/25/19  Yes Fayrene Helper, MD  gabapentin (NEURONTIN) 100 MG capsule Take 1 capsule (100 mg total) by  mouth at bedtime. 01/09/19  Yes Fayrene Helper, MD  LINZESS 145 MCG CAPS capsule Take 145 mcg by mouth daily as needed.  02/14/19  Yes [provider]  triamterene-hydrochlorothiazide (MAXZIDE) 75-50 MG tablet TAKE 1 TABLET EVERY DAY Patient taking differently: Take 1 tablet by mouth daily.  03/23/19  Yes Fayrene Helper, MD  UNABLE TO FIND Lift chair x 1  DX M48.06, M53.87 10/18/18  Yes Fayrene Helper, MD    Allergies    Cellcept [mycophenolate mofetil], Statins, Ciprofloxacin hcl, Hydrocodone, and Penicillins  Review of Systems   Review of Systems  Constitutional: Negative for chills and fever.  HENT: Negative.   Respiratory: Negative for cough and shortness of breath.   Cardiovascular: Positive for leg swelling. Negative for chest pain and palpitations.  Gastrointestinal: Negative for abdominal pain, nausea and vomiting.  Genitourinary: Negative for dysuria and frequency.  Musculoskeletal: Positive for arthralgias and joint swelling.  Skin: Negative for color change and rash.  Neurological: Negative for dizziness, syncope, weakness, light-headedness and numbness.  All other systems reviewed and are  negative.   Physical Exam Updated Vital Signs BP (!) 127/57   Pulse 77   Temp 98.1 F (36.7 C) (Oral)   Resp 20   Ht 5' 6"  (1.676 m)   Wt 65.8 kg   SpO2 99%   BMI 23.40 kg/m   Physical Exam Vitals and nursing note reviewed.  Constitutional:      General: She is not in acute distress.    Appearance: Normal appearance. She is well-developed. She is not ill-appearing or diaphoretic.     Comments: Elderly female, well-appearing and in no distress.  HENT:     Head: Normocephalic and atraumatic.     Mouth/Throat:     Mouth: Mucous membranes are moist.     Pharynx: Oropharynx is clear.  Eyes:     General:        Right eye: No discharge.        Left eye: No discharge.  Cardiovascular:     Rate and Rhythm: Normal rate and regular rhythm.     Heart sounds: Normal heart sounds.  Pulmonary:     Effort: Pulmonary effort is normal. No respiratory distress.     Breath sounds: Normal breath sounds. No wheezing or rales.     Comments: Respirations equal and unlabored, patient able to speak in full sentences, lungs clear to auscultation bilaterally, no crackles noted Abdominal:     General: Bowel sounds are normal. There is no distension.     Palpations: Abdomen is soft. There is no mass.     Tenderness: There is no abdominal tenderness. There is no guarding.     Comments: Abdomen soft, nondistended, nontender to palpation in all quadrants without guarding or peritoneal signs  Musculoskeletal:        General: No deformity.     Cervical back: Neck supple.     Right lower leg: Edema present.     Left lower leg: Edema present.     Comments: 3+ pitting edema up to the level of mid thigh bilaterally. There is a effusion noted over the left knee, but patient is able to range the knee without significant worsening of her pain.  It is slightly warm to touch but without erythema, no ecchymosis noted.  Distal pulses intact.  Skin:    General: Skin is warm and dry.     Capillary Refill:  Capillary refill takes less than 2 seconds.  Neurological:  Mental Status: She is alert.     Coordination: Coordination normal.     Comments: Speech is clear, able to follow commands Moves extremities without ataxia, coordination intact     ED Results / Procedures / Treatments   Labs (all labs ordered are listed, but only abnormal results are displayed) Labs Reviewed  BASIC METABOLIC PANEL - Abnormal; Notable for the following components:      Result Value   Potassium 3.2 (*)    Glucose, Bld 125 (*)    BUN 33 (*)    All other components within normal limits  CBC WITH DIFFERENTIAL/PLATELET - Abnormal; Notable for the following components:   RBC 2.99 (*)    Hemoglobin 8.9 (*)    HCT 29.2 (*)    All other components within normal limits  HEPATIC FUNCTION PANEL - Abnormal; Notable for the following components:   Total Protein 6.0 (*)    Albumin 3.2 (*)    AST 44 (*)    All other components within normal limits  PROTIME-INR - Abnormal; Notable for the following components:   Prothrombin Time 16.7 (*)    INR 1.4 (*)    All other components within normal limits  SARS CORONAVIRUS 2 BY RT PCR (HOSPITAL ORDER, [facility])  BRAIN NATRIURETIC PEPTIDE  MAGNESIUM  PHOSPHORUS  URINALYSIS, ROUTINE W REFLEX MICROSCOPIC  BASIC METABOLIC PANEL    EKG None  Radiology DG Chest Port 1 View  Result Date: 08/04/2019 CLINICAL DATA:  Edema EXAM: PORTABLE CHEST 1 VIEW COMPARISON:  None. FINDINGS: The heart size and mediastinal contours are within normal limits. Aortic knob calcifications are present. The lungs are clear. No large airspace consolidation or pleural effusion. The visualized skeletal structures are unremarkable. IMPRESSION: No active disease. Electronically Signed   By: Prudencio Pair M.D.   On: 08/04/2019 20:16    Procedures Procedures (including critical care time)  Medications Ordered in ED Medications  furosemide (LASIX) injection 40 mg (40 mg  Intravenous Given 08/04/19 2213)    ED Course  I have reviewed the triage vital signs and the nursing notes.  Pertinent labs & imaging results that were available during my care of the patient were reviewed by me and considered in my medical decision making (see chart for details).    MDM Rules/Calculators/A&P                     86 year old female presents with worsening bilateral leg swelling and worsening left knee pain.  Was diagnosed with a hemarthrosis a few weeks ago after arthrocentesis, currently on anticoagulation.  Despite worsening lower extremity edema patient denies shortness of breath.  Has been taking Lasix, elevating the legs and using compression stockings without improvement.  Having increasing difficulty walking to getting around, and currently lives by herself.  I have independently ordered, reviewed and interpreted all labs and imaging: CBC: Hemoglobin of 8.9, no leukocytosis BMP hypokalemia at 3.2, glucose 125, BUN 33, no other significant electrolyte derangements, normal renal function. BNP: not significantly elevated at 59. CXR: No pulmonary edema or other active cardiopulmonary disease.  Lab work does not suggest associated pulmonary edema, normal renal function.  But with significant leg edema not responding to outpatient therapy, now back in stability feels she may need admission for inpatient diuresis and further work-up.  Patient does have worsening left knee effusion, suspect this is worsening hemarthrosis in the setting of anticoagulation.  She to range the knee without worsening pain has no  fevers or leukocytosis, I have low suspicion for septic arthritis.  Will consult for medicine admission.  Case discussed with Dr. Olevia Bowens with [facility] hospitalist who will see and admit the patient.  Patient discussed with Dr. Roderic Palau, who saw patient as well and agrees with plan.  Final Clinical Impression(s) / ED Diagnoses Final diagnoses:  Bilateral lower extremity edema   Effusion of left knee    Rx / DC Orders ED Discharge Orders    None       Janet Berlin 08/05/19 0019    Milton Ferguson, MD 08/05/19 [phone]

## 2019-08-04 NOTE — ED Notes (Signed)
XR  In room

## 2019-08-04 NOTE — H&P (Addendum)
History and Physical    [name] [name] NTI:[id number] DOB: [birth year] DOA: 08/04/2019  PCP: Fayrene Helper, MD   Patient coming from: Home.  I have personally briefly reviewed patient's old medical records in [place]  Chief Complaint: Bilateral lower extremity edema.  HPI: [name] [name] is a 86 y.o. female with medical history significant of osteoarthritis, lumbar spinal stenosis, history of NASH, hyperlipidemia, hypertension, peripheral neuropathy, history of cervical cancer, was diagnosed with a DVT in November last year and is coming to the emergency department with worsening chronic bilateral lower extremity edema to the point that she has been unable to walk and do her daily activities of living due to having pain of the left knee, particularly in the popliteal fossa.  She denies chest pain, dyspnea, cough, dizziness, palpitations, PND or orthopnea.  No fever, chills, rhinorrhea, sore throat, wheezing or hemoptysis.  Denies abdominal pain, nausea, emesis, diarrhea, melena or hematochezia, but gets frequent constipation.  No dysuria, frequency or hematuria.  No polyuria, polydipsia, polyphagia or blurred vision.  ED Course: Initial vital signs were temperature 98.1 F, pulse 77, respiration 20, blood pressure 127/57 mmHg and O2 sat 99% on room air.  The patient received 40 mg of furosemide IVP x1.  CBC showed a white count of 6.6 with 76% neutrophils, 20% lymphocytes.  Hemoglobin 8.9 g/dL and platelets 228.  PT was 16.6 and INR 1.4.  BMP showed potassium of 3.2 mmol/L.  All other electrolytes were normal.  Glucose 125, creatinine 0.58 and BUN 33 mg/dL. LFTs show total protein of 6.0 and albumin 3.2 g/dL.  AST was 44.  ALT, alk phos and bilirubin are within normal limits.  Phosphorus 3.3 magnesium 1.9 mg/dL.  BNP was 59.0 pg/mL.  EKG showed artifact, which was read as atrial flutter.  Her chest radiograph shows normal heart size and does not have any acute cardiopulmonary  pathology.  Review of Systems: As per HPI otherwise all other systems reviewed and are negative.  Past Medical History:  Diagnosis Date  . Arthritis    spinal stenosis  . Elevated LFTs    secondary to fatty liver, negative work-up in 2011  . Hypercholesterolemia   . Hypertension   . Lumbar spinal stenosis 01/14/2018   L3-4 and L4-5  . Myopathy 02/15/2018  . Osteoarthritis    right knee  . Peripheral neuropathy 07/06/2018  . PONV (postoperative nausea and vomiting)   . Uterine cancer ([place]) 08/2006   grade 1, no recurrence up to 2013    Past Surgical History:  Procedure Laterality Date  . 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 APPENDECTOMY  1973  . BREAST BIOPSY Left 2018   Benign  . CHOLECYSTECTOMY  1973  . COLONOSCOPY    06/21/2007   QQP:YPPJKD rectum/Sigmoid diverticula, diminutive hepatic flexure polyp s/p bx. Benign.   . COLONOSCOPY N/A 10/17/2012   Dr. Oneida Alar- moderate diverticulosis was noted in the sigmoid colon, moderate sized internal hemorrhoids. next tcs in10 years  . ESOPHAGOGASTRODUODENOSCOPY N/A 03/20/2016   Dr. Oneida Alar, widely patent Schatzki ring, anemia due to ASA induced erosive gastritis, mild duodenitis. gastric bx benign without H.pylori.   [facility] Kitchen FOOT SURGERY  2007   Pins in toes on left foot, 5 to 6 yrs ago  . GIVENS CAPSULE STUDY N/A 03/20/2016   Procedure: GIVENS CAPSULE STUDY;  Surgeon: Danie Binder, MD;  [facility];  Service: Endoscopy;  Laterality: N/A;  . MUSCLE BIOPSY Left 06/07/2018  Procedure: LEFT THIGH MUSCLE BIOPSY;  Surgeon: Erroll Luna, MD;  Location: [facility];  Service: General;  Laterality: Left;  . TONSILLECTOMY      Social History  reports that she has never smoked. She has never used smokeless tobacco. She reports that she does not drink alcohol or use drugs.  Allergies  Allergen Reactions  . Cellcept [Mycophenolate Mofetil]     Muscle cramps, mouth soreness, difficulty  swallowing, bruising  . Statins     Dx of in  . Ciprofloxacin Hcl Other (See Comments)    Chills, sick, could not tolerate it  . Hydrocodone Nausea Only  . Penicillins Rash    Family History  Problem Relation Age of Onset  . Heart disease Father   . Bladder Cancer Brother        in remission   . Hypertension Sister   . Dementia Sister   . Diabetes type II Sister   . Hypertension Sister   . Aneurysm Sister   . Hypertension Brother   . Arthritis Brother   . Hypertension Son   . Colon cancer Neg Hx    Prior to Admission medications   Medication Sig Start Date End Date Taking? Authorizing Provider  acetaminophen (TYLENOL) 500 MG tablet Take 500 mg by mouth every 6 (six) hours as needed.   Yes [provider]  apixaban (ELIQUIS) 5 MG TABS tablet Take 1 tablet (5 mg total) by mouth 2 (two) times daily. 06/13/19  Yes Derek Jack, MD  ergocalciferol (VITAMIN D2) 1.25 MG (50000 UT) capsule Take 1 capsule (50,000 Units total) by mouth once a week. One capsule once weekly  Started takinjg in January 2021, will take for 6 months only and re check) 06/27/19  Yes Fayrene Helper, MD  ezetimibe (ZETIA) 10 MG tablet TAKE 1 TABLET EVERY DAY Patient taking differently: Take 10 mg by mouth daily.  05/12/19  Yes Perlie Mayo, NP  folic acid (FOLVITE) 1 MG tablet Take 1 tablet (1 mg total) by mouth daily. 08/10/18  Yes Kathrynn Ducking, MD  furosemide (LASIX) 20 MG tablet Take one tablet by mouth, two times weekly, as needed, for leg swelling Patient taking differently: Take 20 mg by mouth as directed. Take one tablet by mouth, two times weekly, as needed, for leg swelling 07/25/19  Yes Fayrene Helper, MD  gabapentin (NEURONTIN) 100 MG capsule Take 1 capsule (100 mg total) by mouth at bedtime. 01/09/19  Yes Fayrene Helper, MD  LINZESS 145 MCG CAPS capsule Take 145 mcg by mouth daily as needed.  02/14/19  Yes [provider]  triamterene-hydrochlorothiazide  (MAXZIDE) 75-50 MG tablet TAKE 1 TABLET EVERY DAY Patient taking differently: Take 1 tablet by mouth daily.  03/23/19  Yes Fayrene Helper, MD  UNABLE TO FIND Lift chair x 1  DX M48.06, M53.87 10/18/18  Yes Fayrene Helper, MD    Physical Exam: Vitals:   08/04/19 2100 08/04/19 2115 08/04/19 2130 08/04/19 2200  BP: (!) 134/53  121/62 (!) 149/63  Pulse: 81     Resp: 20 19 (!) 21 16  Temp:      TempSrc:      SpO2: 100%     Weight:      Height:        Constitutional: NAD, calm, comfortable Eyes: PERRL, lids and conjunctivae normal ENMT: Mucous membranes are moist. Posterior pharynx clear of any exudate or lesions. Neck: normal, supple, no masses, no thyromegaly Respiratory: clear to  auscultation bilaterally, no wheezing, no crackles. Normal respiratory effort. No accessory muscle use.  Cardiovascular: Regular rate and rhythm, no murmurs / rubs / gallops.  4+ left lower extremity and 3+ right lower extremity pitting edema. 2+ pedal pulses. No carotid bruits.  Abdomen: Nondistended.  BS positive.  Soft, no tenderness, no masses palpated. No hepatosplenomegaly.  Musculoskeletal: Generalized weakness.  No clubbing / cyanosis.  Bilateral knee ROM decreased, particularly on the left, no contractures. Normal muscle tone.  Skin: Very numerous hyperpigmented macules on upper back. Ecchymosis on the left lower medial thigh area. Neurologic: CN 2-12 grossly intact. Sensation intact, DTR normal. Strength 5/5 in all 4.  Psychiatric: Normal judgment and insight. Alert and oriented x 3. Normal mood.   Labs on Admission: I have personally reviewed following labs and imaging studies  CBC: Recent Labs  Lab 08/04/19 2035  WBC 6.6  NEUTROABS 5.0  HGB 8.9*  HCT 29.2*  MCV 97.7  PLT 431    Basic Metabolic Panel: Recent Labs  Lab 08/04/19 2035  NA 142  K 3.2*  CL 105  CO2 28  GLUCOSE 125*  BUN 33*  CREATININE 0.58  CALCIUM 8.9    GFR: Estimated Creatinine Clearance: 48.1  mL/min (by C-G formula based on SCr of 0.58 mg/dL).  Liver Function Tests: Recent Labs  Lab 08/04/19 2035  AST 44*  ALT 31  ALKPHOS 59  BILITOT 0.9  PROT 6.0*  ALBUMIN 3.2*    Urine analysis:    Component Value Date/Time   COLORURINE yellow 03/18/2010 1140   APPEARANCEUR Clear 03/18/2010 1140   LABSPEC 1.015 03/18/2010 1140   PHURINE 6.5 03/18/2010 1140   HGBUR small 03/18/2010 1140   BILIRUBINUR neg 05/28/2011 1301   PROTEINUR neg 05/28/2011 1301   UROBILINOGEN 0.2 05/28/2011 1301   UROBILINOGEN 0.2 03/18/2010 1140   NITRITE positive 05/28/2011 1301   NITRITE positive 03/18/2010 1140   LEUKOCYTESUR small (1+) 05/28/2011 1301    Radiological Exams on Admission: DG Chest Port 1 View  Result Date: 08/04/2019 CLINICAL DATA:  Edema EXAM: PORTABLE CHEST 1 VIEW COMPARISON:  None. FINDINGS: The heart size and mediastinal contours are within normal limits. Aortic knob calcifications are present. The lungs are clear. No large airspace consolidation or pleural effusion. The visualized skeletal structures are unremarkable. IMPRESSION: No active disease. Electronically Signed   By: Prudencio Pair M.D.   On: 08/04/2019 20:16    EKG: Independently reviewed.  Vent. rate 78 BPM PR interval * ms QRS duration 99 ms QT/QTc 404/461 ms P-R-T axes * 4 47 Atrial flutter (artifact) Abnormal R-wave progression, early transition Left ventricular hypertrophy  Assessment/Plan Principal Problem:   Bilateral lower extremity edema Observation/telemetry. Fluid restriction. Check daily weights, intake and output. Hold triamterene/HCTZ. Continue furosemide 40 mg IVP daily. Check Doppler US of the lower extremities.  Active Problems:   Deep vein thrombosis (DVT) of left lower extremity (HCC) Abdomen PT/INR. Check ultrasound lower extremities tomorrow.    Hypokalemia Replaced. Check magnesium level. Follow-up potassium level.    Hypoalbuminemia Likely due to nutritional deficit. Check  urinalysis for proteinuria.    Hyperlipidemia LDL goal <100 Continue Zetia 10 mg p.o. daily.    Essential hypertension Continue furosemide in IV form. Monitor blood pressure. Monitor renal function electrolytes.    Normocytic anemia Anemia panel in March/2021 showed low B12 level. Monitor for H&H.    GERD (gastroesophageal reflux disease) Start Protonix 40 mg p.o. daily. Also for GI prophylaxis while on apixaban.    Constipation Continue  Linzess. Add magnesium oxide 400 mg p.o. daily.     DVT prophylaxis: Apixaban. Code Status:   Full code. Family Communication:   Disposition Plan:   Patient is from:  Home.  Anticipated DC to:  Home.  Anticipated DC date:  08/05/2019.  Anticipated DC barriers: Clinical improvement.  Consults called:   Admission status:  Observation/telemetry.   Severity of Illness: Medium severity.  Reubin Milan MD [facility]  How to contact the [facility] Attending or Consulting provider [place] or covering provider during after hours [place], for this patient?   1. Check the care team in [facility] and look for a) attending/consulting [facility] provider listed and b) the [facility] team listed 2. Log into [email or web address] and use [facility]'s universal password to access. If you do not have the password, please contact the hospital operator. 3. Locate the [facility] provider you are looking for under [facility] and page to a number that you can be directly reached. 4. If you still have difficulty reaching the provider, please page the [facility] (Director on Call) for the Hospitalists listed on amion for assistance.  08/04/2019, 11:39 PM   Document was prepared using [employer] and may contain some unintended transcription errors.

## 2019-08-04 NOTE — ED Triage Notes (Addendum)
Pt arrives by ems from home for bilat leg edema and weakness complains of not being able to get around her home. Was here last month for knee pain said it is still bothering her

## 2019-08-05 ENCOUNTER — Observation Stay (HOSPITAL_COMMUNITY): Payer: Medicare HMO

## 2019-08-05 DIAGNOSIS — I82442 Acute embolism and thrombosis of left tibial vein: Secondary | ICD-10-CM | POA: Diagnosis not present

## 2019-08-05 DIAGNOSIS — I82532 Chronic embolism and thrombosis of left popliteal vein: Secondary | ICD-10-CM

## 2019-08-05 DIAGNOSIS — I10 Essential (primary) hypertension: Secondary | ICD-10-CM

## 2019-08-05 DIAGNOSIS — E785 Hyperlipidemia, unspecified: Secondary | ICD-10-CM

## 2019-08-05 DIAGNOSIS — E8809 Other disorders of plasma-protein metabolism, not elsewhere classified: Secondary | ICD-10-CM

## 2019-08-05 DIAGNOSIS — I82432 Acute embolism and thrombosis of left popliteal vein: Secondary | ICD-10-CM | POA: Diagnosis not present

## 2019-08-05 LAB — BASIC METABOLIC PANEL
Anion gap: 9 (ref 5–15)
BUN: 34 mg/dL — ABNORMAL HIGH (ref 8–23)
CO2: 30 mmol/L (ref 22–32)
Calcium: 8.8 mg/dL — ABNORMAL LOW (ref 8.9–10.3)
Chloride: 104 mmol/L (ref 98–111)
Creatinine, Ser: 0.66 mg/dL (ref 0.44–1.00)
GFR calc Af Amer: >60 mL/min (ref >60)
GFR calc non Af Amer: >60 mL/min (ref >60)
Glucose, Bld: 104 mg/dL — ABNORMAL HIGH (ref 70–99)
Potassium: 3.4 mmol/L — ABNORMAL LOW (ref 3.5–5.1)
Sodium: 143 mmol/L (ref 135–145)

## 2019-08-05 LAB — PHOSPHORUS: Phosphorus: 3.3 mg/dL (ref 2.5–4.6)

## 2019-08-05 LAB — PROTIME-INR
INR: 1.4 — ABNORMAL HIGH (ref 0.8–1.2)
Prothrombin Time: 16.7 seconds — ABNORMAL HIGH (ref 11.4–15.2)

## 2019-08-05 LAB — MAGNESIUM: Magnesium: 1.9 mg/dL (ref 1.7–2.4)

## 2019-08-05 MED ORDER — MAGNESIUM OXIDE 400 (241.3 MG) MG PO TABS
400.0000 mg | ORAL_TABLET | Freq: Every day | ORAL | Status: DC
  Administered 2019-08-05 – 2019-08-07 (×3): 400 mg via ORAL
  Filled 2019-08-05 (×3): qty 1

## 2019-08-05 MED ORDER — PANTOPRAZOLE SODIUM 40 MG PO TBEC
40.0000 mg | DELAYED_RELEASE_TABLET | Freq: Every day | ORAL | Status: DC
  Administered 2019-08-05 – 2019-08-07 (×3): 40 mg via ORAL
  Filled 2019-08-05 (×3): qty 1

## 2019-08-05 MED ORDER — POTASSIUM CHLORIDE CRYS ER 20 MEQ PO TBCR
40.0000 meq | EXTENDED_RELEASE_TABLET | Freq: Once | ORAL | Status: AC
  Administered 2019-08-05: 40 meq via ORAL
  Filled 2019-08-05: qty 2

## 2019-08-05 NOTE — Progress Notes (Signed)
PROGRESS NOTE    BURNA ATLAS  EPP:[id number] DOB: 09-20-1932 DOA: 08/04/2019 PCP: Fayrene Helper, MD    Brief Narrative:  86 year old female with a history of arthritis, spinal stenosis, hypertension, who was diagnosed with a DVT 01/2019 and has developed chronic bilateral lower extremity edema.  She is followed by vascular surgery.  Is been on chronic anticoagulation.  She presents to the hospital with worsening edema and difficulty ambulating.  She reports having pain behind her left knee   Assessment & Plan:   Principal Problem:   Bilateral lower extremity edema Active Problems:   Hyperlipidemia LDL goal <100   Essential hypertension   Constipation   Normocytic anemia   GERD (gastroesophageal reflux disease)   Deep vein thrombosis (DVT) of left lower extremity (HCC)   Hypokalemia   Hypoalbuminemia   1. Bilateral lower extremity edema.  Review of prior records from vascular surgery indicate that this is more chronic process.  Patient feels improved overall edema has gotten worse.  She has been encouraged to keep lower extremities elevated.  BNP is in normal range.  Suspect that she has an element of venous stasis.  Continue IV diuretics for now.  Monitor intake and output. 2. Chronic deep vein thrombosis of left lower extremity.  Noted persistent DVT in his ultrasound.  This appears to be consistent with serial ultrasounds done in vascular surgery office.  Continue anticoagulation. 3. Hypokalemia.  Replace 4. Hypoalbuminemia.  Urinalysis ordered to assess proteinuria. 5. Hyperlipidemia.  Continue Zetia 6. Anemia.  No signs of bleeding.  Vitamin B12 level point 05/2021.  Will repeat.  Follow hemoglobin. 7. GERD.  Continue on PPI 8. Generalized weakness.  Patient reports that she chronically ambulates with a walker.  She is having difficulty ambulating at this time.  Physical therapy consult requested to assess safety of discharge home.   DVT prophylaxis: Apixaban Code  Status: Full code Family Communication: Discussed with patient Disposition Plan: Status is: Observation  The patient remains OBS appropriate and will d/c before 2 midnights.  Patient needs physical therapy evaluation to assess safety to discharge home.  She does need continued diuresis for significant lower extremity edema low-dose affecting her ability to ambulate.  Dispo: The patient is from: Home              Anticipated d/c is to: SNF              Anticipated d/c date is: 1 day              Patient currently is not medically stable to d/c.         Consultants:     Procedures:     Antimicrobials:       Subjective: Denies any shortness of breath.  Continues to have pain in her left knee.  She feels that her legs are heavy which causes difficulty ambulating.  Objective: Vitals:   08/04/19 2353 08/05/19 0346 08/05/19 0800 08/05/19 1200  BP: 138/61 112/65 (!) 123/59 122/67  Pulse: 80 96 83 86  Resp: 16 15  17   Temp: 98.6 F (37 C) 98.7 F (37.1 C) 99.4 F (37.4 C) 98.5 F (36.9 C)  TempSrc: Oral  Oral Oral  SpO2: 100% 100% 100% 99%  Weight:      Height:        Intake/Output Summary (Last 24 hours) at 08/05/2019 1935 Last data filed at 08/05/2019 1900 Gross per 24 hour  Intake 360 ml  Output 2100 ml  Net -  1740 ml   Filed Weights   08/04/19 1853 08/04/19 2352  Weight: 65.8 kg 69.8 kg    Examination:  General exam: Appears calm and comfortable  Respiratory system: Clear to auscultation. Respiratory effort normal. Cardiovascular system: S1 & S2 heard, RRR. No JVD, murmurs, rubs, gallops or clicks.  Gastrointestinal system: Abdomen is nondistended, soft and nontender. No organomegaly or masses felt. Normal bowel sounds heard. Central nervous system: Alert and oriented. No focal neurological deficits. Extremities: 2-3+ pitting edema bilaterally, more so Skin: Ecchymosis noted left thigh, appears to be resolving Psychiatry: Judgement and insight appear  normal. Mood & affect appropriate.     Data Reviewed: I have personally reviewed following labs and imaging studies  CBC: Recent Labs  Lab 08/04/19 2035  WBC 6.6  NEUTROABS 5.0  HGB 8.9*  HCT 29.2*  MCV 97.7  PLT 357   Basic Metabolic Panel: Recent Labs  Lab 08/04/19 2035 08/05/19 0635  NA 142 143  K 3.2* 3.4*  CL 105 104  CO2 28 30  GLUCOSE 125* 104*  BUN 33* 34*  CREATININE 0.58 0.66  CALCIUM 8.9 8.8*  MG 1.9  --   PHOS 3.3  --    GFR: Estimated Creatinine Clearance: 48.1 mL/min (by C-G formula based on SCr of 0.66 mg/dL). Liver Function Tests: Recent Labs  Lab 08/04/19 2035  AST 44*  ALT 31  ALKPHOS 59  BILITOT 0.9  PROT 6.0*  ALBUMIN 3.2*   No results for input(s): LIPASE, AMYLASE in the last 168 hours. No results for input(s): AMMONIA in the last 168 hours. Coagulation Profile: Recent Labs  Lab 08/04/19 2035  INR 1.4*   Cardiac Enzymes: No results for input(s): CKTOTAL, CKMB, CKMBINDEX, TROPONINI in the last 168 hours. BNP (last 3 results) No results for input(s): PROBNP in the last 8760 hours. HbA1C: No results for input(s): HGBA1C in the last 72 hours. CBG: No results for input(s): GLUCAP in the last 168 hours. Lipid Profile: No results for input(s): CHOL, HDL, LDLCALC, TRIG, CHOLHDL, LDLDIRECT in the last 72 hours. Thyroid Function Tests: No results for input(s): TSH, T4TOTAL, FREET4, T3FREE, THYROIDAB in the last 72 hours. Anemia Panel: No results for input(s): VITAMINB12, FOLATE, FERRITIN, TIBC, IRON, RETICCTPCT in the last 72 hours. Sepsis Labs: No results for input(s): PROCALCITON, LATICACIDVEN in the last 168 hours.  Recent Results (from the past 240 hour(s))  SARS Coronavirus 2 by RT PCR (hospital order, performed in [facility] hospital lab) Nasopharyngeal Nasopharyngeal Swab     Status: None   Collection Time: 08/04/19  9:58 PM   Specimen: Nasopharyngeal Swab  Result Value Ref Range Status   SARS Coronavirus 2 NEGATIVE  NEGATIVE Final    Comment: (NOTE) SARS-CoV-2 target nucleic acids are NOT DETECTED. The SARS-CoV-2 RNA is generally detectable in upper and lower respiratory specimens during the acute phase of infection. The lowest concentration of SARS-CoV-2 viral copies this assay can detect is 250 copies / mL. A negative result does not preclude SARS-CoV-2 infection and should not be used as the sole basis for treatment or other patient management decisions.  A negative result may occur with improper specimen collection / handling, submission of specimen other than nasopharyngeal swab, presence of viral mutation(s) within the areas targeted by this assay, and inadequate number of viral copies (<250 copies / mL). A negative result must be combined with clinical observations, patient history, and epidemiological information. Fact Sheet for Patients:   StrictlyIdeas.no Fact Sheet for Healthcare Providers: BankingDealers.co.za This test is  not yet approved or cleared  by the [place] and has been authorized for detection and/or diagnosis of SARS-CoV-2 by FDA under an Emergency Use Authorization (EUA).  This EUA will remain in effect (meaning this test can be used) for the duration of the COVID-19 declaration under Section 564(b)(1) of the Act, 21 U.S.C. section 360bbb-3(b)(1), unless the authorization is terminated or revoked sooner. Performed at [facility], [address], [place], [place] [address]          Radiology Studies: US Venous Img Lower Bilateral (DVT)  Result Date: 08/05/2019 CLINICAL DATA:  Bilateral lower extremity pain and edema. History of previous DVT. Evaluate for acute or chronic DVT. EXAM: BILATERAL LOWER EXTREMITY VENOUS DOPPLER ULTRASOUND TECHNIQUE: Gray-scale sonography with graded compression, as well as color Doppler and duplex ultrasound were performed to evaluate the lower extremity deep venous systems from the  level of the common femoral vein and including the common femoral, femoral, profunda femoral, popliteal and calf veins including the posterior tibial, peroneal and gastrocnemius veins when visible. The superficial great saphenous vein was also interrogated. Spectral Doppler was utilized to evaluate flow at rest and with distal augmentation maneuvers in the common femoral, femoral and popliteal veins. COMPARISON:  Bilateral lower extremity venous Doppler ultrasound-02/05/2019 (positive for occlusive DVT involving the left femoral and popliteal veins) FINDINGS: RIGHT LOWER EXTREMITY Common Femoral Vein: No evidence of thrombus. Normal compressibility, respiratory phasicity and response to augmentation. Saphenofemoral Junction: No evidence of thrombus. Normal compressibility and flow on color Doppler imaging. Profunda Femoral Vein: No evidence of thrombus. Normal compressibility and flow on color Doppler imaging. Femoral Vein: No evidence of thrombus. Normal compressibility, respiratory phasicity and response to augmentation. Popliteal Vein: No evidence of thrombus. Normal compressibility, respiratory phasicity and response to augmentation. Calf Veins: No evidence of thrombus. Normal compressibility and flow on color Doppler imaging. Superficial Great Saphenous Vein: No evidence of thrombus. Normal compressibility. Other Findings:  None. LEFT LOWER EXTREMITY Common Femoral Vein: No evidence of thrombus. Normal compressibility, respiratory phasicity and response to augmentation. Saphenofemoral Junction: No evidence of thrombus. Normal compressibility and flow on color Doppler imaging. Profunda Femoral Vein: No evidence of thrombus. Normal compressibility and flow on color Doppler imaging. Femoral Vein: No evidence of acute or chronic thrombus. Normal compressibility, respiratory phasicity and response to augmentation. Popliteal Vein: There is hypoechoic occlusive thrombus involving the left popliteal vein (images 55 and  56) Calf Veins: There is hypoechoic thrombus involving the left posterior tibial (image 59) and one of the paired left peroneal veins (image 61). The adjacent peroneal vein appears patent (image 62), as does the anterior tibial vein (image 67). Superficial Great Saphenous Vein: No evidence of thrombus. Normal compressibility. Other Findings:  None. IMPRESSION: 1. 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 2. No evidence of acute or chronic DVT within the right lower extremity. Electronically Signed   By: Sandi Mariscal M.D.   On: 08/05/2019 09:31   DG Chest Port 1 View  Result Date: 08/04/2019 CLINICAL DATA:  Edema EXAM: PORTABLE CHEST 1 VIEW COMPARISON:  None. FINDINGS: The heart size and mediastinal contours are within normal limits. Aortic knob calcifications are present. The lungs are clear. No large airspace consolidation or pleural effusion. The visualized skeletal  structures are unremarkable. IMPRESSION: No active disease. Electronically Signed   By: Prudencio Pair M.D.   On: 08/04/2019 20:16        Scheduled Meds: . apixaban  5 mg Oral BID  . ezetimibe  10 mg Oral Daily  . folic acid  1 mg Oral Daily  . furosemide  40 mg Intravenous Daily  . gabapentin  100 mg Oral QHS  . magnesium oxide  400 mg Oral Daily  . pantoprazole  40 mg Oral Daily   Continuous Infusions:   LOS: 0 days    Time spent: 59mns    JKathie Dike MD [facility]   If 7PM-7AM, please contact night-coverage [email or web address]  08/05/2019, 7:35 PM

## 2019-08-06 DIAGNOSIS — Z79899 Other long term (current) drug therapy: Secondary | ICD-10-CM | POA: Diagnosis not present

## 2019-08-06 DIAGNOSIS — M48061 Spinal stenosis, lumbar region without neurogenic claudication: Secondary | ICD-10-CM | POA: Diagnosis present

## 2019-08-06 DIAGNOSIS — I82502 Chronic embolism and thrombosis of unspecified deep veins of left lower extremity: Secondary | ICD-10-CM | POA: Diagnosis present

## 2019-08-06 DIAGNOSIS — K219 Gastro-esophageal reflux disease without esophagitis: Secondary | ICD-10-CM | POA: Diagnosis present

## 2019-08-06 DIAGNOSIS — R600 Localized edema: Secondary | ICD-10-CM | POA: Diagnosis present

## 2019-08-06 DIAGNOSIS — E876 Hypokalemia: Secondary | ICD-10-CM | POA: Diagnosis present

## 2019-08-06 DIAGNOSIS — Z888 Allergy status to other drugs, medicaments and biological substances status: Secondary | ICD-10-CM | POA: Diagnosis not present

## 2019-08-06 DIAGNOSIS — I82532 Chronic embolism and thrombosis of left popliteal vein: Secondary | ICD-10-CM | POA: Diagnosis not present

## 2019-08-06 DIAGNOSIS — G629 Polyneuropathy, unspecified: Secondary | ICD-10-CM | POA: Diagnosis present

## 2019-08-06 DIAGNOSIS — K5900 Constipation, unspecified: Secondary | ICD-10-CM | POA: Diagnosis present

## 2019-08-06 DIAGNOSIS — E7800 Pure hypercholesterolemia, unspecified: Secondary | ICD-10-CM | POA: Diagnosis present

## 2019-08-06 DIAGNOSIS — Z8542 Personal history of malignant neoplasm of other parts of uterus: Secondary | ICD-10-CM | POA: Diagnosis not present

## 2019-08-06 DIAGNOSIS — Z885 Allergy status to narcotic agent status: Secondary | ICD-10-CM | POA: Diagnosis not present

## 2019-08-06 DIAGNOSIS — M25462 Effusion, left knee: Secondary | ICD-10-CM | POA: Diagnosis present

## 2019-08-06 DIAGNOSIS — Z20822 Contact with and (suspected) exposure to covid-19: Secondary | ICD-10-CM | POA: Diagnosis present

## 2019-08-06 DIAGNOSIS — Z880 Allergy status to penicillin: Secondary | ICD-10-CM | POA: Diagnosis not present

## 2019-08-06 DIAGNOSIS — D649 Anemia, unspecified: Secondary | ICD-10-CM | POA: Diagnosis present

## 2019-08-06 DIAGNOSIS — I10 Essential (primary) hypertension: Secondary | ICD-10-CM | POA: Diagnosis present

## 2019-08-06 DIAGNOSIS — M25062 Hemarthrosis, left knee: Secondary | ICD-10-CM | POA: Diagnosis present

## 2019-08-06 DIAGNOSIS — M7989 Other specified soft tissue disorders: Secondary | ICD-10-CM | POA: Diagnosis present

## 2019-08-06 DIAGNOSIS — E8809 Other disorders of plasma-protein metabolism, not elsewhere classified: Secondary | ICD-10-CM | POA: Diagnosis present

## 2019-08-06 DIAGNOSIS — Z7901 Long term (current) use of anticoagulants: Secondary | ICD-10-CM | POA: Diagnosis not present

## 2019-08-06 DIAGNOSIS — Z8249 Family history of ischemic heart disease and other diseases of the circulatory system: Secondary | ICD-10-CM | POA: Diagnosis not present

## 2019-08-06 DIAGNOSIS — Z9049 Acquired absence of other specified parts of digestive tract: Secondary | ICD-10-CM | POA: Diagnosis not present

## 2019-08-06 DIAGNOSIS — E785 Hyperlipidemia, unspecified: Secondary | ICD-10-CM | POA: Diagnosis present

## 2019-08-06 LAB — RENAL FUNCTION PANEL
Albumin: 2.7 g/dL — ABNORMAL LOW (ref 3.5–5.0)
Anion gap: 7 (ref 5–15)
BUN: 31 mg/dL — ABNORMAL HIGH (ref 8–23)
CO2: 31 mmol/L (ref 22–32)
Calcium: 8.5 mg/dL — ABNORMAL LOW (ref 8.9–10.3)
Chloride: 105 mmol/L (ref 98–111)
Creatinine, Ser: 0.63 mg/dL (ref 0.44–1.00)
GFR calc Af Amer: >60 mL/min (ref >60)
GFR calc non Af Amer: >60 mL/min (ref >60)
Glucose, Bld: 104 mg/dL — ABNORMAL HIGH (ref 70–99)
Phosphorus: 3.1 mg/dL (ref 2.5–4.6)
Potassium: 3.6 mmol/L (ref 3.5–5.1)
Sodium: 143 mmol/L (ref 135–145)

## 2019-08-06 MED ORDER — POTASSIUM CHLORIDE CRYS ER 20 MEQ PO TBCR
40.0000 meq | EXTENDED_RELEASE_TABLET | Freq: Once | ORAL | Status: AC
  Administered 2019-08-06: 40 meq via ORAL
  Filled 2019-08-06: qty 2

## 2019-08-06 NOTE — Plan of Care (Signed)
  Problem: Acute Rehab PT Goals(only PT should resolve) Goal: Pt Will Go Supine/Side To Sit Outcome: Progressing Flowsheets (Taken 08/06/2019 1121) Pt will go Supine/Side to Sit: with min guard assist Goal: Patient Will Transfer Sit To/From Stand Outcome: Progressing Flowsheets (Taken 08/06/2019 1121) Patient will transfer sit to/from stand: with minimal assist Goal: Pt Will Transfer Bed To Chair/Chair To Bed Outcome: Progressing Flowsheets (Taken 08/06/2019 1121) Pt will Transfer Bed to Chair/Chair to Bed:  with min assist  with mod assist Goal: Pt Will Ambulate Outcome: Progressing Flowsheets (Taken 08/06/2019 1121) Pt will Ambulate:  15 feet  with minimal assist  with moderate assist  with rolling walker   11:21 AM, 08/06/19 Lonell Grandchild, MPT Physical Therapist with [facility] [phone] office [phone] mobile phone

## 2019-08-06 NOTE — Evaluation (Signed)
Physical Therapy Evaluation Patient Details Name: [name] [name] MRN: [id number] DOB: 07-22-1932 Today's Date: 08/06/2019   History of Present Illness  [name] [name] is a 86 y.o. female with medical history significant of osteoarthritis, lumbar spinal stenosis, history of NASH, hyperlipidemia, hypertension, peripheral neuropathy, history of cervical cancer, was diagnosed with a DVT in November last year and is coming to the emergency department with worsening chronic bilateral lower extremity edema to the point that she has been unable to walk and do her daily activities of living due to having pain of the left knee, particularly in the popliteal fossa.  She denies chest pain, dyspnea, cough, dizziness, palpitations, PND or orthopnea.  No fever, chills, rhinorrhea, sore throat, wheezing or hemoptysis.  Denies abdominal pain, nausea, emesis, diarrhea, melena or hematochezia, but gets frequent constipation.  No dysuria, frequency or hematuria.  No polyuria, polydipsia, polyphagia or blurred vision.    Clinical Impression  Patient very weak and demonstrates poor tolerance for weightbearing on LLE due to increased pain, at severe risk for falls and limited to a few unsteady labored side steps at bedside with frequent buckling of knees.  Patient tolerated sitting up in chair after therapy - nursing staff notified.  Patient will benefit from continued physical therapy in hospital and recommended venue below to increase strength, balance, endurance for safe ADLs and gait.     Follow Up Recommendations SNF    Equipment Recommendations  None recommended by PT    Recommendations for Other Services       Precautions / Restrictions Precautions Precautions: Fall Restrictions Weight Bearing Restrictions: No      Mobility  Bed Mobility Overal bed mobility: Needs Assistance Bed Mobility: Supine to Sit     Supine to sit: Min assist     General bed mobility comments: slow labored  movement  Transfers Overall transfer level: Needs assistance   Transfers: Sit to/from Stand;Stand Pivot Transfers Sit to Stand: Mod assist Stand pivot transfers: Mod assist;Max assist       General transfer comment: frequent buckling of knees due to weakness  Ambulation/Gait Ambulation/Gait assistance: Max assist Gait Distance (Feet): 3 Feet Assistive device: Rolling walker (2 wheeled) Gait Pattern/deviations: Decreased step length - right;Decreased step length - left;Decreased stance time - left;Decreased stride length;Antalgic Gait velocity: slow   General Gait Details: limited to 3-4 very unsteady slow labored steps with poor tolerance for weightbearing on LLE due to increased pain  Stairs            Wheelchair Mobility    Modified Rankin (Stroke Patients Only)       Balance Overall balance assessment: Needs assistance Sitting-balance support: Feet supported;No upper extremity supported Sitting balance-Leahy Scale: Fair Sitting balance - Comments: fair/good seated at EOB   Standing balance support: During functional activity;Bilateral upper extremity supported Standing balance-Leahy Scale: Poor Standing balance comment: using RW                             Pertinent Vitals/Pain Pain Assessment: Faces Faces Pain Scale: Hurts little more Pain Location: LLE with movement Pain Descriptors / Indicators: Sore;Grimacing;Guarding Pain Intervention(s): Limited activity within patient's tolerance;Monitored during session;Repositioned    Home Living Family/patient expects to be discharged to:: Private residence Living Arrangements: Alone Available Help at Discharge: Family;Available PRN/intermittently Type of Home: House Home Access: Stairs to enter Entrance Stairs-Rails: Right;Left(to wide to reach both) Entrance Stairs-Number of Steps: 2 Home Layout: One level Home Equipment: Environmental consultant -  4 wheels;Cane - single point;Bedside commode;Shower seat       Prior Function Level of Independence: Needs assistance   Gait / Transfers Assistance Needed: household Geologist, engineering  ADL's / Homemaking Assistance Needed: assisted by family (neice)        Journalist, newspaper        Extremity/Trunk Assessment   Upper Extremity Assessment Upper Extremity Assessment: Generalized weakness    Lower Extremity Assessment Lower Extremity Assessment: Generalized weakness    Cervical / Trunk Assessment Cervical / Trunk Assessment: Normal  Communication   Communication: No difficulties  Cognition Arousal/Alertness: Awake/alert Behavior During Therapy: WFL for tasks assessed/performed Overall Cognitive Status: Within Functional Limits for tasks assessed                                        General Comments      Exercises     Assessment/Plan    PT Assessment Patient needs continued PT services  PT Problem List Decreased strength;Decreased activity tolerance;Decreased balance;Decreased mobility       PT Treatment Interventions Gait training;Stair training;Functional mobility training;Therapeutic activities;Therapeutic exercise;Patient/family education    PT Goals (Current goals can be found in the Care Plan section)  Acute Rehab PT Goals Patient Stated Goal: return home after rehab PT Goal Formulation: With patient Time For Goal Achievement: 08/20/19 Potential to Achieve Goals: Good    Frequency Min 3X/week   Barriers to discharge        Co-evaluation               AM-PAC PT "6 Clicks" Mobility  Outcome Measure Help needed turning from your back to your side while in a flat bed without using bedrails?: A Lot Help needed moving from lying on your back to sitting on the side of a flat bed without using bedrails?: A Lot Help needed moving to and from a bed to a chair (including a wheelchair)?: A Lot Help needed standing up from a chair using your arms (e.g., wheelchair or bedside chair)?: A  Lot Help needed to walk in hospital room?: A Lot Help needed climbing 3-5 steps with a railing? : Total 6 Click Score: 11    End of Session   Activity Tolerance: Patient tolerated treatment well;Patient limited by fatigue Patient left: in chair;with call bell/phone within reach Nurse Communication: Mobility status PT Visit Diagnosis: Unsteadiness on feet (R26.81);Other abnormalities of gait and mobility (R26.89);Muscle weakness (generalized) (M62.81)    Time: 4967-5916 PT Time Calculation (min) (ACUTE ONLY): 33 min   Charges:   PT Evaluation $PT Eval Moderate Complexity: 1 Mod PT Treatments $Therapeutic Activity: 23-37 mins        11:18 AM, 08/06/19 Lonell Grandchild, MPT Physical Therapist with [facility] [phone] office [phone] mobile phone

## 2019-08-06 NOTE — Progress Notes (Signed)
PROGRESS NOTE    [name] [name]  POE:[id number] DOB: 1932-09-22 DOA: 08/04/2019 PCP: Fayrene Helper, MD    Brief Narrative:  86 year old female with a history of arthritis, spinal stenosis, hypertension, who was diagnosed with a DVT 01/2019 and has developed chronic bilateral lower extremity edema.  She is followed by vascular surgery.  She has been on chronic anticoagulation.  She presents to the hospital with worsening edema and difficulty ambulating.  She reports having pain behind her left knee   Assessment & Plan:   Principal Problem:   Bilateral lower extremity edema Active Problems:   Hyperlipidemia LDL goal <100   Essential hypertension   Constipation   Normocytic anemia   GERD (gastroesophageal reflux disease)   Deep vein thrombosis (DVT) of left lower extremity (HCC)   Hypokalemia   Hypoalbuminemia   Lower extremity edema   1. Bilateral lower extremity edema.  Review of prior records from vascular surgery indicate that this is more chronic process.  Patient feels that her edema has gotten overall worse lately.  She has been encouraged to keep lower extremities elevated.  BNP is in normal range.  Suspect that she has an element of venous stasis.  Continue IV diuretics for now.  She has had mild improvement with diuresis.  She is followed by vascular surgery and was undergoing compression therapy.  Monitor intake and output. 2. Chronic deep vein thrombosis of left lower extremity.  Noted persistent DVT in current venous ultrasound.  This appears to be consistent with serial ultrasounds done in vascular surgery office.  Continue anticoagulation. 3. Recent hemarthrosis.  Patient recently presented to the emergency room for left knee pain.  She was noted to have effusion and underwent arthrocentesis.  Fluid was noted to be bloody.  She followed up with orthopedics, Dr. Aline Brochure who felt that she had hemarthrosis and recommended supportive management.  She does not appear to have  any worsening bruising at this time.  Follow hemoglobin 4. Hypokalemia.  Replace 5. Hypoalbuminemia.  Urinalysis ordered to assess proteinuria. 6. Hyperlipidemia.  Continue Zetia 7. Anemia.  No signs of bleeding.  Vitamin B12 level noted to be low in 05/2019.  Will repeat.  Follow hemoglobin. 8. GERD.  Continue on PPI 9. Generalized weakness.  Patient reports that she chronically ambulates with a walker.  She is unable to walk due to worsening edema in her legs and pain.  Seen by physical therapy with recommendations for skilled nursing facility placement   DVT prophylaxis: Apixaban Code Status: Full code Family Communication: Discussed with patient and son at the bedside Disposition Plan: Status is: Inpatient  The patient will require care spanning > 2 midnights and should be moved to inpatient because: Unsafe d/c plan patient seen by physical therapy and felt to be high risk for falls.  She would not be safe to discharge home in her current state.  Recommendations would be for skilled nursing facility placement.  Dispo: The patient is from: Home              Anticipated d/c is to: SNF              Anticipated d/c date is: 1 day              Patient currently is not medically stable to d/c.         Consultants:     Procedures:     Antimicrobials:       Subjective: Continues to have pain in left  knee.  Feels that overall swelling is improving.  Continues to have difficulty ambulating.  Seen by physical therapy with recommendations for skilled nursing facility placement.  Objective: Vitals:   08/05/19 2056 08/05/19 2116 08/06/19 0512 08/06/19 1357  BP: (!) 108/45 (!) 106/58 (!) 115/56 (!) 111/51  Pulse: 86  84 80  Resp: 19  20 20   Temp: 99.3 F (37.4 C)  99 F (37.2 C) 99 F (37.2 C)  TempSrc: Oral  Oral   SpO2: 100%  95% 100%  Weight:      Height:        Intake/Output Summary (Last 24 hours) at 08/06/2019 1812 Last data filed at 08/06/2019 1500 Gross per 24  hour  Intake 600 ml  Output 1500 ml  Net -900 ml   Filed Weights   08/04/19 1853 08/04/19 2352  Weight: 65.8 kg 69.8 kg    Examination:  General exam: Alert, awake, oriented x 3 Respiratory system: Clear to auscultation. Respiratory effort normal. Cardiovascular system:RRR. No murmurs, rubs, gallops. Gastrointestinal system: Abdomen is nondistended, soft and nontender. No organomegaly or masses felt. Normal bowel sounds heard. Central nervous system: Alert and oriented. No focal neurological deficits. Extremities: 2+ edema bilaterally Skin: No rashes, lesions or ulcers Psychiatry: Judgement and insight appear normal. Mood & affect appropriate.      Data Reviewed: I have personally reviewed following labs and imaging studies  CBC: Recent Labs  Lab 08/04/19 2035  WBC 6.6  NEUTROABS 5.0  HGB 8.9*  HCT 29.2*  MCV 97.7  PLT 341   Basic Metabolic Panel: Recent Labs  Lab 08/04/19 2035 08/05/19 0635 08/06/19 0630  NA 142 143 143  K 3.2* 3.4* 3.6  CL 105 104 105  CO2 28 30 31   GLUCOSE 125* 104* 104*  BUN 33* 34* 31*  CREATININE 0.58 0.66 0.63  CALCIUM 8.9 8.8* 8.5*  MG 1.9  --   --   PHOS 3.3  --  3.1   GFR: Estimated Creatinine Clearance: 48.1 mL/min (by C-G formula based on SCr of 0.63 mg/dL). Liver Function Tests: Recent Labs  Lab 08/04/19 2035 08/06/19 0630  AST 44*  --   ALT 31  --   ALKPHOS 59  --   BILITOT 0.9  --   PROT 6.0*  --   ALBUMIN 3.2* 2.7*   No results for input(s): LIPASE, AMYLASE in the last 168 hours. No results for input(s): AMMONIA in the last 168 hours. Coagulation Profile: Recent Labs  Lab 08/04/19 2035  INR 1.4*   Cardiac Enzymes: No results for input(s): CKTOTAL, CKMB, CKMBINDEX, TROPONINI in the last 168 hours. BNP (last 3 results) No results for input(s): PROBNP in the last 8760 hours. HbA1C: No results for input(s): HGBA1C in the last 72 hours. CBG: No results for input(s): GLUCAP in the last 168 hours. Lipid  Profile: No results for input(s): CHOL, HDL, LDLCALC, TRIG, CHOLHDL, LDLDIRECT in the last 72 hours. Thyroid Function Tests: No results for input(s): TSH, T4TOTAL, FREET4, T3FREE, THYROIDAB in the last 72 hours. Anemia Panel: No results for input(s): VITAMINB12, FOLATE, FERRITIN, TIBC, IRON, RETICCTPCT in the last 72 hours. Sepsis Labs: No results for input(s): PROCALCITON, LATICACIDVEN in the last 168 hours.  Recent Results (from the past 240 hour(s))  SARS Coronavirus 2 by RT PCR (hospital order, performed in [facility] hospital lab) Nasopharyngeal Nasopharyngeal Swab     Status: None   Collection Time: 08/04/19  9:58 PM   Specimen: Nasopharyngeal Swab  Result Value Ref Range  Status   SARS Coronavirus 2 NEGATIVE NEGATIVE Final    Comment: (NOTE) SARS-CoV-2 target nucleic acids are NOT DETECTED. The SARS-CoV-2 RNA is generally detectable in upper and lower respiratory specimens during the acute phase of infection. The lowest concentration of SARS-CoV-2 viral copies this assay can detect is 250 copies / mL. A negative result does not preclude SARS-CoV-2 infection and should not be used as the sole basis for treatment or other patient management decisions.  A negative result may occur with improper specimen collection / handling, submission of specimen other than nasopharyngeal swab, presence of viral mutation(s) within the areas targeted by this assay, and inadequate number of viral copies (<250 copies / mL). A negative result must be combined with clinical observations, patient history, and epidemiological information. Fact Sheet for Patients:   StrictlyIdeas.no Fact Sheet for Healthcare Providers: BankingDealers.co.za This test is not yet approved or cleared  by the [place] FDA and has been authorized for detection and/or diagnosis of SARS-CoV-2 by FDA under an Emergency Use Authorization (EUA).  This EUA will remain in effect  (meaning this test can be used) for the duration of the COVID-19 declaration under Section 564(b)(1) of the Act, 21 U.S.C. section 360bbb-3(b)(1), unless the authorization is terminated or revoked sooner. Performed at [facility], [address] [place], [place] [address]          Radiology Studies: US Venous Img Lower Bilateral (DVT)  Result Date: 08/05/2019 CLINICAL DATA:  Bilateral lower extremity pain and edema. History of previous DVT. Evaluate for acute or chronic DVT. EXAM: BILATERAL LOWER EXTREMITY VENOUS DOPPLER ULTRASOUND TECHNIQUE: Gray-scale sonography with graded compression, as well as color Doppler and duplex ultrasound were performed to evaluate the lower extremity deep venous systems from the level of the common femoral vein and including the common femoral, femoral, profunda femoral, popliteal and calf veins including the posterior tibial, peroneal and gastrocnemius veins when visible. The superficial great saphenous vein was also interrogated. Spectral Doppler was utilized to evaluate flow at rest and with distal augmentation maneuvers in the common femoral, femoral and popliteal veins. COMPARISON:  Bilateral lower extremity venous Doppler ultrasound-02/05/2019 (positive for occlusive DVT involving the left femoral and popliteal veins) FINDINGS: RIGHT LOWER EXTREMITY Common Femoral Vein: No evidence of thrombus. Normal compressibility, respiratory phasicity and response to augmentation. Saphenofemoral Junction: No evidence of thrombus. Normal compressibility and flow on color Doppler imaging. Profunda Femoral Vein: No evidence of thrombus. Normal compressibility and flow on color Doppler imaging. Femoral Vein: No evidence of thrombus. Normal compressibility, respiratory phasicity and response to augmentation. Popliteal Vein: No evidence of thrombus. Normal compressibility, respiratory phasicity and response to augmentation. Calf Veins: No evidence of thrombus. Normal  compressibility and flow on color Doppler imaging. Superficial Great Saphenous Vein: No evidence of thrombus. Normal compressibility. Other Findings:  None. LEFT LOWER EXTREMITY Common Femoral Vein: No evidence of thrombus. Normal compressibility, respiratory phasicity and response to augmentation. Saphenofemoral Junction: No evidence of thrombus. Normal compressibility and flow on color Doppler imaging. Profunda Femoral Vein: No evidence of thrombus. Normal compressibility and flow on color Doppler imaging. Femoral Vein: No evidence of acute or chronic thrombus. Normal compressibility, respiratory phasicity and response to augmentation. Popliteal Vein: There is hypoechoic occlusive thrombus involving the left popliteal vein (images 55 and 56) Calf Veins: There is hypoechoic thrombus involving the left posterior tibial (image 59) and one of the paired left peroneal veins (image 61). The adjacent peroneal vein appears patent (image 62), as does the anterior tibial vein (  image 67). Superficial Great Saphenous Vein: No evidence of thrombus. Normal compressibility. Other Findings:  None. IMPRESSION: 1. 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 2. No evidence of acute or chronic DVT within the right lower extremity. Electronically Signed   By: Sandi Mariscal M.D.   On: 08/05/2019 09:31   DG Chest Port 1 View  Result Date: 08/04/2019 CLINICAL DATA:  Edema EXAM: PORTABLE CHEST 1 VIEW COMPARISON:  None. FINDINGS: The heart size and mediastinal contours are within normal limits. Aortic knob calcifications are present. The lungs are clear. No large airspace consolidation or pleural effusion. The visualized skeletal structures are unremarkable.  IMPRESSION: No active disease. Electronically Signed   By: Prudencio Pair M.D.   On: 08/04/2019 20:16        Scheduled Meds: . apixaban  5 mg Oral BID  . ezetimibe  10 mg Oral Daily  . folic acid  1 mg Oral Daily  . furosemide  40 mg Intravenous Daily  . gabapentin  100 mg Oral QHS  . magnesium oxide  400 mg Oral Daily  . pantoprazole  40 mg Oral Daily   Continuous Infusions:   LOS: 0 days    Time spent: 106mns    JKathie Dike MD [facility]   If 7PM-7AM, please contact night-coverage [email or web address]  08/06/2019, 6:12 PM

## 2019-08-07 ENCOUNTER — Inpatient Hospital Stay
Admission: RE | Admit: 2019-08-07 | Discharge: 2019-09-27 | Disposition: A | Discharge status: Home / Self Care | Payer: Medicare HMO | Source: Ambulatory Visit | Attending: Internal Medicine | Admitting: Internal Medicine

## 2019-08-07 DIAGNOSIS — L03116 Cellulitis of left lower limb: Secondary | ICD-10-CM | POA: Diagnosis not present

## 2019-08-07 DIAGNOSIS — G609 Hereditary and idiopathic neuropathy, unspecified: Secondary | ICD-10-CM | POA: Diagnosis not present

## 2019-08-07 DIAGNOSIS — I82412 Acute embolism and thrombosis of left femoral vein: Secondary | ICD-10-CM | POA: Diagnosis not present

## 2019-08-07 DIAGNOSIS — N390 Urinary tract infection, site not specified: Secondary | ICD-10-CM | POA: Diagnosis not present

## 2019-08-07 DIAGNOSIS — M25462 Effusion, left knee: Secondary | ICD-10-CM | POA: Diagnosis not present

## 2019-08-07 DIAGNOSIS — D649 Anemia, unspecified: Secondary | ICD-10-CM | POA: Diagnosis not present

## 2019-08-07 DIAGNOSIS — R531 Weakness: Secondary | ICD-10-CM | POA: Diagnosis not present

## 2019-08-07 DIAGNOSIS — M25062 Hemarthrosis, left knee: Secondary | ICD-10-CM | POA: Diagnosis not present

## 2019-08-07 DIAGNOSIS — E785 Hyperlipidemia, unspecified: Secondary | ICD-10-CM | POA: Diagnosis not present

## 2019-08-07 DIAGNOSIS — M48061 Spinal stenosis, lumbar region without neurogenic claudication: Secondary | ICD-10-CM | POA: Diagnosis not present

## 2019-08-07 DIAGNOSIS — K5901 Slow transit constipation: Secondary | ICD-10-CM | POA: Diagnosis not present

## 2019-08-07 DIAGNOSIS — I10 Essential (primary) hypertension: Secondary | ICD-10-CM | POA: Diagnosis not present

## 2019-08-07 DIAGNOSIS — M6281 Muscle weakness (generalized): Secondary | ICD-10-CM | POA: Diagnosis not present

## 2019-08-07 DIAGNOSIS — K219 Gastro-esophageal reflux disease without esophagitis: Secondary | ICD-10-CM | POA: Diagnosis not present

## 2019-08-07 DIAGNOSIS — Z8542 Personal history of malignant neoplasm of other parts of uterus: Secondary | ICD-10-CM | POA: Diagnosis not present

## 2019-08-07 DIAGNOSIS — R600 Localized edema: Secondary | ICD-10-CM

## 2019-08-07 DIAGNOSIS — K5909 Other constipation: Secondary | ICD-10-CM | POA: Diagnosis not present

## 2019-08-07 DIAGNOSIS — Z90710 Acquired absence of both cervix and uterus: Secondary | ICD-10-CM | POA: Diagnosis not present

## 2019-08-07 DIAGNOSIS — I82532 Chronic embolism and thrombosis of left popliteal vein: Secondary | ICD-10-CM | POA: Diagnosis not present

## 2019-08-07 DIAGNOSIS — Z7901 Long term (current) use of anticoagulants: Secondary | ICD-10-CM | POA: Diagnosis not present

## 2019-08-07 DIAGNOSIS — I82502 Chronic embolism and thrombosis of unspecified deep veins of left lower extremity: Secondary | ICD-10-CM | POA: Diagnosis not present

## 2019-08-07 DIAGNOSIS — Z86718 Personal history of other venous thrombosis and embolism: Secondary | ICD-10-CM | POA: Diagnosis not present

## 2019-08-07 DIAGNOSIS — E559 Vitamin D deficiency, unspecified: Secondary | ICD-10-CM | POA: Diagnosis not present

## 2019-08-07 DIAGNOSIS — E876 Hypokalemia: Secondary | ICD-10-CM | POA: Diagnosis not present

## 2019-08-07 DIAGNOSIS — M2500 Hemarthrosis, unspecified joint: Secondary | ICD-10-CM | POA: Diagnosis not present

## 2019-08-07 DIAGNOSIS — M25562 Pain in left knee: Secondary | ICD-10-CM | POA: Diagnosis not present

## 2019-08-07 DIAGNOSIS — K7581 Nonalcoholic steatohepatitis (NASH): Secondary | ICD-10-CM | POA: Diagnosis not present

## 2019-08-07 LAB — SARS CORONAVIRUS 2 BY RT PCR (HOSPITAL ORDER, PERFORMED IN ~~LOC~~ HOSPITAL LAB): SARS Coronavirus 2: NEGATIVE

## 2019-08-07 LAB — CBC
HCT: 25.6 % — ABNORMAL LOW (ref 36.0–46.0)
Hemoglobin: 7.8 g/dL — ABNORMAL LOW (ref 12.0–15.0)
MCH: 30 pg (ref 26.0–34.0)
MCHC: 30.5 g/dL (ref 30.0–36.0)
MCV: 98.5 fL (ref 80.0–100.0)
Platelets: 226 10*3/uL (ref 150–400)
RBC: 2.6 MIL/uL — ABNORMAL LOW (ref 3.87–5.11)
RDW: 13.5 % (ref 11.5–15.5)
WBC: 6.7 10*3/uL (ref 4.0–10.5)
nRBC: 0 % (ref 0.0–0.2)

## 2019-08-07 LAB — BASIC METABOLIC PANEL
Anion gap: 9 (ref 5–15)
BUN: 31 mg/dL — ABNORMAL HIGH (ref 8–23)
CO2: 29 mmol/L (ref 22–32)
Calcium: 8.8 mg/dL — ABNORMAL LOW (ref 8.9–10.3)
Chloride: 104 mmol/L (ref 98–111)
Creatinine, Ser: 0.73 mg/dL (ref 0.44–1.00)
GFR calc Af Amer: >60 mL/min (ref >60)
GFR calc non Af Amer: >60 mL/min (ref >60)
Glucose, Bld: 121 mg/dL — ABNORMAL HIGH (ref 70–99)
Potassium: 4.2 mmol/L (ref 3.5–5.1)
Sodium: 142 mmol/L (ref 135–145)

## 2019-08-07 LAB — OCCULT BLOOD X 1 CARD TO LAB, STOOL: Fecal Occult Bld: NEGATIVE

## 2019-08-07 LAB — VITAMIN B12: Vitamin B-12: 431 pg/mL (ref 180–914)

## 2019-08-07 MED ORDER — BISACODYL 10 MG RE SUPP
10.0000 mg | Freq: Once | RECTAL | Status: AC
  Administered 2019-08-07: 10 mg via RECTAL
  Filled 2019-08-07: qty 1

## 2019-08-07 MED ORDER — POTASSIUM CHLORIDE ER 20 MEQ PO TBCR: 20.0000 meq | EXTENDED_RELEASE_TABLET | ORAL | 1 refills | Status: DC

## 2019-08-07 MED ORDER — MAGNESIUM OXIDE 400 (241.3 MG) MG PO TABS: 400.0000 mg | ORAL_TABLET | Freq: Every day | ORAL | 2 refills | Status: DC

## 2019-08-07 MED ORDER — PANTOPRAZOLE SODIUM 40 MG PO TBEC: 40.0000 mg | DELAYED_RELEASE_TABLET | Freq: Every day | ORAL | 3 refills | Status: DC

## 2019-08-07 MED ORDER — FUROSEMIDE 40 MG PO TABS: 40.0000 mg | ORAL_TABLET | Freq: Every day | ORAL | 3 refills | Status: DC

## 2019-08-07 NOTE — TOC Initial Note (Signed)
Transition of Care [facility]) - Initial/Assessment Note    Patient Details  Name: [name] [name] MRN: [id number] Date of Birth: Oct 28, 1932  [facility] CM/SW Contact:    Salome Arnt, LCSW Phone Number: 08/07/2019, 1:02 PM  Clinical Narrative:  LCSW spoke with pt regarding d/c plan. Pt lives alone and reports her son does not live close by, but checks on her often and her niece assists with some things like groceries. Pt is fairly independent at baseline, but said she has declined some since her husband past away last year. PT evaluated pt and recommend SNF. LCSW discussed placement process and provided options. Pt requests to stay in [place]. LCSW started authorization. Will follow.                Expected Discharge Plan: Skilled Nursing Facility Barriers to Discharge: Insurance Authorization   Patient Goals and CMS Choice Patient states their goals for this hospitalization and ongoing recovery are:: short-term SNF   Choice offered to / list presented to : Patient  Expected Discharge Plan and Services Expected Discharge Plan: [place] In-house Referral: Clinical Social Work   Post Acute Care Choice: [place] Living arrangements for the past 2 months: [place]                                      Prior Living Arrangements/Services Living arrangements for the past 2 months: Single Family Home Lives with:: Self   Do you feel safe going back to the place where you live?: No   Needs rehab first  Need for Family Participation in Patient Care: No (Comment) Care giver support system in place?: No (comment)(Pt lives alone) Current home services: DME Criminal Activity/Legal Involvement Pertinent to Current Situation/Hospitalization: No - Comment as needed  Activities of Daily Living Home Assistive Devices/Equipment: Shower chair with back, Environmental consultant (specify type) ADL Screening (condition at time of  admission) Patient's cognitive ability adequate to safely complete daily activities?: Yes Is the patient deaf or have difficulty hearing?: No Does the patient have difficulty seeing, even when wearing glasses/contacts?: No Does the patient have difficulty concentrating, remembering, or making decisions?: No Patient able to express need for assistance with ADLs?: Yes Does the patient have difficulty dressing or bathing?: Yes Independently performs ADLs?: No Communication: Independent Dressing (OT): Needs assistance Is this a change from baseline?: Pre-admission baseline Grooming: Needs assistance Is this a change from baseline?: Pre-admission baseline Feeding: Independent Bathing: Needs assistance Is this a change from baseline?: Pre-admission baseline Toileting: Needs assistance Is this a change from baseline?: Pre-admission baseline In/Out Bed: Dependent Is this a change from baseline?: Pre-admission baseline Walks in Home: Dependent Is this a change from baseline?: Pre-admission baseline Does the patient have difficulty walking or climbing stairs?: Yes Weakness of Legs: Both Weakness of Arms/Hands: None  Permission Sought/Granted                  Emotional Assessment Appearance:: Appears stated age Attitude/Demeanor/Rapport: Engaged Affect (typically observed): Appropriate Orientation: : Oriented to Self, Oriented to Place, Oriented to  Time, Oriented to Situation   Psych Involvement: No (comment)  Admission diagnosis:  Effusion of left knee [M25.462] Bilateral lower extremity edema [R60.0] Lower extremity edema [R60.0] Patient Active Problem List   Diagnosis Date Noted  . Lower extremity edema 08/06/2019  . Bilateral lower extremity edema 08/04/2019  . Deep vein thrombosis (DVT)  of left lower extremity ([place]) 08/04/2019  . Hypokalemia 08/04/2019  . Hypoalbuminemia 08/04/2019  . Right thigh pain 05/15/2019  . Venous stasis ulcers ([place]) 04/03/2019  . Blister  03/24/2019  . Lower leg edema 03/22/2019  . At high risk for falls per [facility] fall risk assessment scale 02/09/2019  . Acute deep vein thrombosis (DVT) of left femoral vein ([place]) 02/07/2019  . Neck pain, bilateral posterior 01/13/2019  . Statins contraindicated 08/14/2018  . Educated about COVID-19 virus infection 08/14/2018  . Peripheral neuropathy 07/06/2018  . Dysphagia 05/03/2018  . 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 Lumbar spinal stenosis 01/14/2018  . Abnormal weight loss 07/29/2017  . GERD (gastroesophageal reflux disease) 06/17/2016  . Normocytic anemia   . Annual physical exam 11/29/2014  . 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 Constipation 10/04/2012  . Urinary incontinence 06/20/2012  . Vitamin D deficiency 08/22/2011  . Prediabetes 05/29/2011  . NASH (nonalcoholic steatohepatitis) 11/05/2009  . Arthritis of right knee 09/04/2009  . Hyperlipidemia LDL goal <100 09/08/2007  . Essential hypertension 09/08/2007   PCP:  Fayrene Helper, MD Pharmacy:   [place], [place] - [place] [address] [place] [address] [place] [address] Phone: [phone] Fax: [facility] [place], [place] [address] Phone: [phone] Fax: [phone]     Social Determinants of Health (SDOH) Interventions    Readmission Risk Interventions No flowsheet data found.

## 2019-08-07 NOTE — Discharge Instructions (Signed)
1)You are taking Eliquis/Apixan which is a blood Thinner--- Avoid ibuprofen/Advil/Aleve/Motrin/Goody Powders/Naproxen/BC powders/Meloxicam/Diclofenac/Indomethacin and other Nonsteroidal anti-inflammatory medications as these will make you more likely to bleed and can cause stomach ulcers, can also cause Kidney problems.   2)Very low-salt diet advised 3)Weigh yourself daily, call if you gain more than 3 pounds in 1 day or more than 5 pounds in 1 week as your diuretic medications may need to be adjusted 4)Limit your Fluid  intake to no more than 60 ounces (1.8 Liters) per day 5) repeat CBC and BMP blood test in 1 week on or around 08/14/2019

## 2019-08-07 NOTE — Progress Notes (Signed)
Report called to [place] at the [facility]

## 2019-08-07 NOTE — Discharge Summary (Signed)
GABREILLE DARDIS, is a 86 y.o. female  DOB 1932-09-19  MRN [id number].  Admission date:  08/04/2019  Admitting Physician  Kathie Dike, MD  Discharge Date:  08/07/2019   Primary MD  Fayrene Helper, MD  Recommendations for primary care physician for things to follow:   1)You are taking Eliquis/Apixan which is a blood Thinner--- Avoid ibuprofen/Advil/Aleve/Motrin/Goody Powders/Naproxen/BC powders/Meloxicam/Diclofenac/Indomethacin and other Nonsteroidal anti-inflammatory medications as these will make you more likely to bleed and can cause stomach ulcers, can also cause Kidney problems.   2)Very low-salt diet advised 3)Weigh yourself daily, call if you gain more than 3 pounds in 1 day or more than 5 pounds in 1 week as your diuretic medications may need to be adjusted 4)Limit your Fluid  intake to no more than 60 ounces (1.8 Liters) per day 5) repeat CBC and BMP blood test in 1 week on or around 08/14/2019  Admission Diagnosis  Effusion of left knee [M25.462] Bilateral lower extremity edema [R60.0] Lower extremity edema [R60.0]   Discharge Diagnosis  Effusion of left knee [M25.462] Bilateral lower extremity edema [R60.0] Lower extremity edema [R60.0]    Principal Problem:   Bilateral lower extremity edema Active Problems:   Hyperlipidemia LDL goal <100   Essential hypertension   Constipation   Normocytic anemia   GERD (gastroesophageal reflux disease)   Deep vein thrombosis (DVT) of left lower extremity (HCC)   Hypokalemia   Hypoalbuminemia   Lower extremity edema      Past Medical History:  Diagnosis Date  . Arthritis    spinal stenosis  . Elevated LFTs    secondary to fatty liver, negative work-up in 2011  . Hypercholesterolemia   . Hypertension   . Lumbar spinal stenosis 01/14/2018   L3-4 and L4-5  . Myopathy 02/15/2018  . Osteoarthritis    right knee  . Peripheral neuropathy  07/06/2018  . PONV (postoperative nausea and vomiting)   . Uterine cancer ([place]) 08/2006   grade 1, no recurrence up to 2013    Past Surgical History:  Procedure Laterality Date  . 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 APPENDECTOMY  1973  . BREAST BIOPSY Left 2018   Benign  . CHOLECYSTECTOMY  1973  . COLONOSCOPY    06/21/2007   [facility]:BULAGT rectum/Sigmoid diverticula, diminutive hepatic flexure polyp s/p bx. Benign.   . COLONOSCOPY N/A 10/17/2012   Dr. Oneida Alar- moderate diverticulosis was noted in the sigmoid colon, moderate sized internal hemorrhoids. next tcs in10 years  . ESOPHAGOGASTRODUODENOSCOPY N/A 03/20/2016   Dr. Oneida Alar, widely patent Schatzki ring, anemia due to ASA induced erosive gastritis, mild duodenitis. gastric bx benign without H.pylori.   Marland Kitchen FOOT SURGERY  2007   Pins in toes on left foot, 5 to 6 yrs ago  . GIVENS CAPSULE STUDY N/A 03/20/2016   Procedure: GIVENS CAPSULE STUDY;  Surgeon: Danie Binder, MD;  [facility];  Service: Endoscopy;  Laterality: N/A;  . MUSCLE BIOPSY Left 06/07/2018   Procedure: LEFT THIGH MUSCLE  BIOPSY;  Surgeon: Erroll Luna, MD;  Location: [place];  Service: General;  Laterality: Left;  . TONSILLECTOMY       HPI  from the history and physical done on the day of admission:    Chief Complaint: Bilateral lower extremity edema.  HPI: CHRISTIONNA POLAND is a 86 y.o. female with medical history significant of osteoarthritis, lumbar spinal stenosis, history of NASH, hyperlipidemia, hypertension, peripheral neuropathy, history of cervical cancer, was diagnosed with a DVT in November last year and is coming to the emergency department with worsening chronic bilateral lower extremity edema to the point that she has been unable to walk and do her daily activities of living due to having pain of the left knee, particularly in the popliteal fossa.  She denies chest pain, dyspnea, cough, dizziness, palpitations,  PND or orthopnea.  No fever, chills, rhinorrhea, sore throat, wheezing or hemoptysis.  Denies abdominal pain, nausea, emesis, diarrhea, melena or hematochezia, but gets frequent constipation.  No dysuria, frequency or hematuria.  No polyuria, polydipsia, polyphagia or blurred vision.  ED Course: Initial vital signs were temperature 98.1 F, pulse 77, respiration 20, blood pressure 127/57 mmHg and O2 sat 99% on room air.  The patient received 40 mg of furosemide IVP x1.  CBC showed a white count of 6.6 with 76% neutrophils, 20% lymphocytes.  Hemoglobin 8.9 g/dL and platelets 228.  PT was 16.6 and INR 1.4.  BMP showed potassium of 3.2 mmol/L.  All other electrolytes were normal.  Glucose 125, creatinine 0.58 and BUN 33 mg/dL. LFTs show total protein of 6.0 and albumin 3.2 g/dL.  AST was 44.  ALT, alk phos and bilirubin are within normal limits.  Phosphorus 3.3 magnesium 1.9 mg/dL.  BNP was 59.0 pg/mL.  EKG showed artifact, which was read as atrial flutter.  Her chest radiograph shows normal heart size and does not have any acute cardiopulmonary pathology.  Review of Systems: As per HPI otherwise all other systems reviewed and are negative    Hospital Course:     Brief Narrative:  86 year old female with a history of arthritis, spinal stenosis, hypertension, who was diagnosed with a DVT 01/2019 and has developed chronic bilateral lower extremity edema.  She is followed by vascular surgery.  She has been on chronic anticoagulation.  She presents to the hospital with worsening edema and difficulty ambulating.  She reports having pain behind her left knee   Assessment & Plan:   Principal Problem:   Bilateral lower extremity edema Active Problems:   Hyperlipidemia LDL goal <100   Essential hypertension   Constipation   Normocytic anemia   GERD (gastroesophageal reflux disease)   Deep vein thrombosis (DVT) of left lower extremity (HCC)   Hypokalemia   Hypoalbuminemia   Lower extremity  edema   1)Bilateral lower extremity edema.----Improved with diuresis, - Review of prior records from vascular surgery indicate that this is more chronic process.  Patient feels that her edema has gotten overall worse lately.  She has been encouraged to keep lower extremities elevated.  BNP is in normal range.  Suspect that she has an element of venous stasis.   --- Will avoid TED stockings or Ace wraps at this time given chronic DVT   She is followed by vascular surgery and was undergoing compression therapy.   -Lasix with potassium and magnesium supplementation as ordered   2)Chronic deep vein thrombosis of left lower extremity---  Noted persistent DVT in current venous ultrasound.  This appears to be  consistent with serial ultrasounds done in vascular surgery office.  Continue anticoagulation with Eliquis/apixaban   3)Recent Lt hemarthrosis----  Patient recently presented to the emergency room for left knee pain.  She was noted to have effusion and underwent arthrocentesis.  Fluid was noted to be bloody.  She followed up with orthopedics, Dr. Aline Brochure who felt that she had hemarthrosis and recommended supportive management.  She does not appear to have any worsening bruising at this time.  Follow hemoglobin  4)HLD--continue Zetia  5)Anemia---  No signs of bleeding.  Vitamin B12 level noted to be low in 05/2019.  Repeat B12  on 08/07/2019 is 431 (WNL)-   6)GERD.  Continue on PPI  7)Generalized weakness.  Patient reports that she chronically ambulates with a walker.  She is unable to walk due to worsening edema in her legs and pain.  Seen by physical therapy with recommendations for skilled nursing facility placement   DVT prophylaxis: Apixaban Code Status: Full code Family Communication:  Previously discussed with patient and son at the bedside Disposition Plan:  Discharge to [facility] SNF rehab  Dispo: The patient is from: Home  Anticipated d/c is to: SNF  Discharge  Condition: stable  Follow UP--- PCP as advised  Diet and Activity recommendation:  As advised  Discharge Instructions    Discharge Instructions    Call MD for:  difficulty breathing, headache or visual disturbances   Complete by: As directed    Call MD for:  extreme fatigue   Complete by: As directed    Call MD for:  persistant dizziness or light-headedness   Complete by: As directed    Call MD for:  persistant nausea and vomiting   Complete by: As directed    Call MD for:  severe uncontrolled pain   Complete by: As directed    Call MD for:  temperature >100.4   Complete by: As directed    Diet - low sodium heart healthy   Complete by: As directed    Discharge instructions   Complete by: As directed    1)You are taking Eliquis/Apixan which is a blood Thinner--- Avoid ibuprofen/Advil/Aleve/Motrin/Goody Powders/Naproxen/BC powders/Meloxicam/Diclofenac/Indomethacin and other Nonsteroidal anti-inflammatory medications as these will make you more likely to bleed and can cause stomach ulcers, can also cause Kidney problems.   2)Very low-salt diet advised 3)Weigh yourself daily, call if you gain more than 3 pounds in 1 day or more than 5 pounds in 1 week as your diuretic medications may need to be adjusted 4)Limit your Fluid  intake to no more than 60 ounces (1.8 Liters) per day 5) repeat CBC and BMP blood test in 1 week on or around 08/14/2019   Increase activity slowly   Complete by: As directed         Discharge Medications     Allergies as of 08/07/2019      Reactions   Cellcept [mycophenolate Mofetil]    Muscle cramps, mouth soreness, difficulty swallowing, bruising   Statins    Dx of in   Ciprofloxacin Hcl Other (See Comments)   Chills, sick, could not tolerate it   Hydrocodone Nausea Only   Penicillins Rash      Medication List    STOP taking these medications   triamterene-hydrochlorothiazide 75-50 MG tablet Commonly known as: MAXZIDE     TAKE these  medications   acetaminophen 500 MG tablet Commonly known as: TYLENOL Take 500 mg by mouth every 6 (six) hours as needed.   apixaban 5 MG  Tabs tablet Commonly known as: ELIQUIS Take 1 tablet (5 mg total) by mouth 2 (two) times daily.   ergocalciferol 1.25 MG (50000 UT) capsule Commonly known as: VITAMIN D2 Take 1 capsule (50,000 Units total) by mouth once a week. One capsule once weekly  Started takinjg in January 2021, will take for 6 months only and re check)   ezetimibe 10 MG tablet Commonly known as: ZETIA TAKE 1 TABLET EVERY DAY   folic acid 1 MG tablet Commonly known as: FOLVITE Take 1 tablet (1 mg total) by mouth daily.   furosemide 40 MG tablet Commonly known as: LASIX Take 1 tablet (40 mg total) by mouth daily. What changed:   medication strength  how much to take  how to take this  when to take this  additional instructions   gabapentin 100 MG capsule Commonly known as: NEURONTIN Take 1 capsule (100 mg total) by mouth at bedtime.   Linzess 145 MCG Caps capsule Generic drug: linaclotide Take 145 mcg by mouth daily as needed.   magnesium oxide 400 (241.3 Mg) MG tablet Commonly known as: MAG-OX Take 1 tablet (400 mg total) by mouth daily. Start taking on: Aug 08, 2019   pantoprazole 40 MG tablet Commonly known as: PROTONIX Take 1 tablet (40 mg total) by mouth daily. Start taking on: Aug 08, 2019   Potassium Chloride ER 20 MEQ Tbcr Take 20 mEq by mouth every Tuesday, Thursday, Saturday, and Sunday. Take only while on Lasix Start taking on: Aug 08, 2019   UNABLE TO FIND Lift chair x 1  DX M48.06, M53.87       Major procedures and Radiology Reports - PLEASE review detailed and final reports for all details, in brief -   US Venous Img Lower Bilateral (DVT)  Result Date: 08/05/2019 CLINICAL DATA:  Bilateral lower extremity pain and edema. History of previous DVT. Evaluate for acute or chronic DVT. EXAM: BILATERAL LOWER EXTREMITY VENOUS DOPPLER  ULTRASOUND TECHNIQUE: Gray-scale sonography with graded compression, as well as color Doppler and duplex ultrasound were performed to evaluate the lower extremity deep venous systems from the level of the common femoral vein and including the common femoral, femoral, profunda femoral, popliteal and calf veins including the posterior tibial, peroneal and gastrocnemius veins when visible. The superficial great saphenous vein was also interrogated. Spectral Doppler was utilized to evaluate flow at rest and with distal augmentation maneuvers in the common femoral, femoral and popliteal veins. COMPARISON:  Bilateral lower extremity venous Doppler ultrasound-02/05/2019 (positive for occlusive DVT involving the left femoral and popliteal veins) FINDINGS: RIGHT LOWER EXTREMITY Common Femoral Vein: No evidence of thrombus. Normal compressibility, respiratory phasicity and response to augmentation. Saphenofemoral Junction: No evidence of thrombus. Normal compressibility and flow on color Doppler imaging. Profunda Femoral Vein: No evidence of thrombus. Normal compressibility and flow on color Doppler imaging. Femoral Vein: No evidence of thrombus. Normal compressibility, respiratory phasicity and response to augmentation. Popliteal Vein: No evidence of thrombus. Normal compressibility, respiratory phasicity and response to augmentation. Calf Veins: No evidence of thrombus. Normal compressibility and flow on color Doppler imaging. Superficial Great Saphenous Vein: No evidence of thrombus. Normal compressibility. Other Findings:  None. LEFT LOWER EXTREMITY Common Femoral Vein: No evidence of thrombus. Normal compressibility, respiratory phasicity and response to augmentation. Saphenofemoral Junction: No evidence of thrombus. Normal compressibility and flow on color Doppler imaging. Profunda Femoral Vein: No evidence of thrombus. Normal compressibility and flow on color Doppler imaging. Femoral Vein: No evidence of acute or  chronic thrombus.  Normal compressibility, respiratory phasicity and response to augmentation. Popliteal Vein: There is hypoechoic occlusive thrombus involving the left popliteal vein (images 55 and 56) Calf Veins: There is hypoechoic thrombus involving the left posterior tibial (image 59) and one of the paired left peroneal veins (image 61). The adjacent peroneal vein appears patent (image 62), as does the anterior tibial vein (image 67). Superficial Great Saphenous Vein: No evidence of thrombus. Normal compressibility. Other Findings:  None. IMPRESSION: 1. 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 2. No evidence of acute or chronic DVT within the right lower extremity. Electronically Signed   By: Sandi Mariscal M.D.   On: 08/05/2019 09:31   DG Chest Port 1 View  Result Date: 08/04/2019 CLINICAL DATA:  Edema EXAM: PORTABLE CHEST 1 VIEW COMPARISON:  None. FINDINGS: The heart size and mediastinal contours are within normal limits. Aortic knob calcifications are present. The lungs are clear. No large airspace consolidation or pleural effusion. The visualized skeletal structures are unremarkable. IMPRESSION: No active disease. Electronically Signed   By: Prudencio Pair M.D.   On: 08/04/2019 20:16   DG Knee Complete 4 Views Left  Result Date: 07/14/2019 CLINICAL DATA:  Left knee pain and swelling, onset this morning. EXAM: LEFT KNEE - COMPLETE 4+ VIEW COMPARISON:  None. FINDINGS: No fracture or dislocation. Tricompartmental osteoarthritis with peripheral spurring and medial tibiofemoral joint space narrowing. There is chondrocalcinosis medially and laterally. No erosion or bony destruction. No periosteal reaction. Moderate joint effusion.  There is generalized soft tissue edema. IMPRESSION: 1. Tricompartmental osteoarthritis with chondrocalcinosis. Moderate joint effusion. No acute bony abnormality. 2. Generalized soft tissue edema. Electronically Signed   By: Keith Rake M.D.   On: 07/14/2019 21:14    Micro Results   Recent Results (from the past 240 hour(s))  SARS Coronavirus 2 by RT PCR (hospital order, performed in [facility] hospital lab) Nasopharyngeal Nasopharyngeal Swab     Status: None   Collection Time: 08/04/19  9:58 PM   Specimen: Nasopharyngeal Swab  Result Value Ref Range Status   SARS Coronavirus 2 NEGATIVE NEGATIVE Final    Comment: (NOTE) SARS-CoV-2 target nucleic acids are NOT DETECTED. The SARS-CoV-2 RNA is generally detectable in upper and lower respiratory specimens during the acute phase of infection. The lowest concentration of SARS-CoV-2 viral copies this assay can detect is 250 copies / mL. A negative result does not preclude SARS-CoV-2 infection and should not be used as the sole basis for treatment or other patient management decisions.  A negative result may occur with improper specimen collection / handling, submission of specimen other than nasopharyngeal swab, presence of viral mutation(s) within the areas targeted by this assay, and inadequate number of viral copies (<250 copies / mL). A negative result must be combined with clinical observations, patient history, and epidemiological information. Fact Sheet for Patients:   StrictlyIdeas.no Fact Sheet for Healthcare Providers: BankingDealers.co.za This test is not yet approved or cleared  by the [place] FDA and has been authorized for detection and/or diagnosis of SARS-CoV-2 by FDA under an Emergency Use Authorization (EUA).  This EUA will remain in effect (meaning this test can be used) for the duration of the COVID-19 declaration under Section 564(b)(1) of the Act, 21  U.S.C. section  360bbb-3(b)(1), unless the authorization is terminated or revoked sooner. Performed at [facility], [address], [place], [place] [address]        Today   Subjective    Gertie Broerman today has no new complaints -Fatigue and generalized weakness persist  No Nausea, Vomiting or Diarrhea No fever  Or chills        Patient has been seen and examined prior to discharge   Objective   Blood pressure (!) 120/45, pulse 89, temperature 99.3 F (37.4 C), temperature source Oral, resp. rate 18, height _0  (1.676 m), weight 69.8 kg, SpO2 97 %.   Intake/Output Summary (Last 24 hours) at 08/07/2019 1500 Last data filed at 08/06/2019 1700 Gross per 24 hour  Intake 240 ml  Output 400 ml  Net -160 ml    Exam Gen:- Awake Alert, no acute distress  HEENT:- [place].AT, No sclera icterus Neck-Supple Neck,No JVD,.  Lungs-  CTAB , good air movement bilaterally  CV- S1, S2 normal, regular Abd-  +ve B.Sounds, Abd Soft, No tenderness,    Extremity/Skin:-1+ pitting edema bilaterally,   good pulses Psych-affect is appropriate, oriented x3 Neuro-generalized weakness, no new focal deficits, no tremors  MSK-left knee swelling and discomfort with deep palpation and range of motion--overlying skin without significant erythema rash or significant warmth or streaking   Data Review   CBC w Diff:  Lab Results  Component Value Date   WBC 6.7 08/07/2019   HGB 7.8 (L) 08/07/2019   HGB 11.6 10/24/2018   HCT 25.6 (L) 08/07/2019   HCT 34.1 10/24/2018   PLT 226 08/07/2019   PLT 166 10/24/2018   LYMPHOPCT 13 08/04/2019   MONOPCT 10 08/04/2019   EOSPCT 1 08/04/2019   BASOPCT 0 08/04/2019    CMP:  Lab Results  Component Value Date   NA 142 08/07/2019   NA 142 10/24/2018   K 4.2 08/07/2019   CL 104 08/07/2019   CO2 29 08/07/2019   BUN 31 (H) 08/07/2019   BUN 32 (H) 10/24/2018   CREATININE 0.73 08/07/2019   CREATININE 0.82 01/04/2019   PROT 6.0 (L) 08/04/2019   PROT 5.9 (L)  10/24/2018   ALBUMIN 2.7 (L) 08/06/2019   ALBUMIN 4.3 10/24/2018   BILITOT 0.9 08/04/2019   BILITOT 0.6 10/24/2018   ALKPHOS 59 08/04/2019   AST 44 (H) 08/04/2019   ALT 31 08/04/2019  .   Total Discharge time is about 33 minutes  Roxan Hockey M.D on 08/07/2019 at 3:00 PM  Go to [email or web address] -  for contact info  [facility] - Office  [phone]

## 2019-08-07 NOTE — TOC Transition Note (Addendum)
Transition of Care [facility]) - CM/SW Discharge Note   Patient Details  Name: [name] [name] MRN: [id number] Date of Birth: 1932/10/24  Transition of Care [facility] CM/SW Contact:  Salome Arnt, LCSW Phone Number: 08/07/2019, 3:22 PM   Clinical Narrative:  Pt accepts bed at [facility] and authorization received- [id number]. [facility] notified and will get pt. Pt and RN also aware. Pt states she will notify her son. COVID test negative.     Final next level of care: Skilled Nursing Facility Barriers to Discharge: Barriers Resolved   Patient Goals and CMS Choice Patient states their goals for this hospitalization and ongoing recovery are:: short-term SNF   Choice offered to / list presented to : Patient  Discharge Placement PASRR number recieved: 08/07/19            Patient [facility] bed at: [facility] Patient to be transferred to facility by: [facility] staff Name of family member notified: pt requests to notify son herself Patient and family notified of of transfer: 08/07/19  Discharge Plan and Services In-house Referral: Clinical Social Work   Post Acute Care Choice: [place]                               Social Determinants of Health (SDOH) Interventions     Readmission Risk Interventions No flowsheet data found.

## 2019-08-07 NOTE — NC FL2 (Signed)
[place] LEVEL OF CARE SCREENING TOOL     IDENTIFICATION  Patient Name: [name] [name] Birthdate: [birth year] Sex: female Admission Date (Current Location): 08/04/2019  [facility] and [place] Number:  [employer] and Address:  [address], [place]      Provider Number: [id number]  Attending Physician Name and Address:  Roxan Hockey, MD  Relative Name and Phone Number:       Current Level of Care: Hospital Recommended Level of Care: [place] Prior Approval Number:    Date Approved/Denied:   PASRR Number: [id number]  Discharge Plan: SNF    Current Diagnoses: Patient Active Problem List   Diagnosis Date Noted  . Lower extremity edema 08/06/2019  . Bilateral lower extremity edema 08/04/2019  . Deep vein thrombosis (DVT) of left lower extremity ([place]) 08/04/2019  . Hypokalemia 08/04/2019  . Hypoalbuminemia 08/04/2019  . Right thigh pain 05/15/2019  . Venous stasis ulcers ([place]) 04/03/2019  . Blister 03/24/2019  . Lower leg edema 03/22/2019  . At high risk for falls per [facility] fall risk assessment scale 02/09/2019  . Acute deep vein thrombosis (DVT) of left femoral vein ([place]) 02/07/2019  . Neck pain, bilateral posterior 01/13/2019  . Statins contraindicated 08/14/2018  . Educated about COVID-19 virus infection 08/14/2018  . Peripheral neuropathy 07/06/2018  . Dysphagia 05/03/2018  . 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 Lumbar spinal stenosis 01/14/2018  . Abnormal weight loss 07/29/2017  . GERD (gastroesophageal reflux disease) 06/17/2016  . Normocytic anemia   . Annual physical exam 11/29/2014  . 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 Constipation 10/04/2012  . Urinary incontinence 06/20/2012  . Vitamin D deficiency 08/22/2011  . Prediabetes 05/29/2011  . NASH (nonalcoholic steatohepatitis) 11/05/2009  . Arthritis of right knee 09/04/2009  .  Hyperlipidemia LDL goal <100 09/08/2007  . Essential hypertension 09/08/2007    Orientation RESPIRATION BLADDER Height & Weight     Self, Time, Situation, Place  Normal External catheter Weight: 153 lb 14.1 oz (69.8 kg) Height:  5' 6"  (167.6 cm)  BEHAVIORAL SYMPTOMS/MOOD NEUROLOGICAL BOWEL NUTRITION STATUS      Continent Diet(heart healthy- see d/c summary)  AMBULATORY STATUS COMMUNICATION OF NEEDS Skin   Extensive Assist Verbally Normal                       Personal Care Assistance Level of Assistance  Bathing, Feeding, Dressing Bathing Assistance: Limited assistance Feeding assistance: Limited assistance Dressing Assistance: Limited assistance     Functional Limitations Info  Sight, Speech, Hearing Sight Info: Adequate Hearing Info: Adequate Speech Info: Adequate    SPECIAL CARE FACTORS FREQUENCY  PT (By licensed PT)     PT Frequency: daily              Contractures      Additional Factors Info  Code Status, Allergies Code Status Info: Full code Allergies Info: Cellcept, Statins, Ciprofloxacin Hcl, Hydrocodone, Penicillins           Current Medications (08/07/2019):  This is the current hospital active medication list Current Facility-Administered Medications  Medication Dose Route Frequency Provider Last Rate Last Admin  . acetaminophen (TYLENOL) tablet 650 mg  650 mg Oral Q6H PRN Reubin Milan, MD   650 mg at 08/06/19 1305   Or  . acetaminophen (TYLENOL) suppository 650 mg  650 mg Rectal Q6H PRN Reubin Milan, MD      .  apixaban (ELIQUIS) tablet 5 mg  5 mg Oral BID Reubin Milan, MD   5 mg at 08/07/19 0850  . ezetimibe (ZETIA) tablet 10 mg  10 mg Oral Daily Reubin Milan, MD   10 mg at 08/07/19 0850  . folic acid (FOLVITE) tablet 1 mg  1 mg Oral Daily Reubin Milan, MD   1 mg at 08/07/19 0850  . furosemide (LASIX) injection 40 mg  40 mg Intravenous Daily Reubin Milan, MD   40 mg at 08/07/19 0850  . gabapentin  (NEURONTIN) capsule 100 mg  100 mg Oral QHS Reubin Milan, MD      . linaclotide [facility]) capsule 145 mcg  145 mcg Oral Daily PRN Reubin Milan, MD      . magnesium oxide (MAG-OX) tablet 400 mg  400 mg Oral Daily Reubin Milan, MD   400 mg at 08/07/19 0850  . pantoprazole (PROTONIX) EC tablet 40 mg  40 mg Oral Daily Reubin Milan, MD   40 mg at 08/07/19 0851  . prochlorperazine (COMPAZINE) injection 5 mg  5 mg Intravenous Q6H PRN Reubin Milan, MD         Discharge Medications: Please see discharge summary for a list of discharge medications.  Relevant Imaging Results:  Relevant Lab Results:   Additional Information SS#: [id number]  Salome Arnt, LCSW

## 2019-08-08 ENCOUNTER — Telehealth: Payer: Self-pay | Admitting: *Deleted

## 2019-08-08 ENCOUNTER — Encounter: Payer: Self-pay | Admitting: Adult Health

## 2019-08-08 ENCOUNTER — Non-Acute Institutional Stay (SKILLED_NURSING_FACILITY): Payer: Medicare HMO | Admitting: Adult Health

## 2019-08-08 DIAGNOSIS — E785 Hyperlipidemia, unspecified: Secondary | ICD-10-CM | POA: Diagnosis not present

## 2019-08-08 DIAGNOSIS — I82532 Chronic embolism and thrombosis of left popliteal vein: Secondary | ICD-10-CM | POA: Diagnosis not present

## 2019-08-08 DIAGNOSIS — K5901 Slow transit constipation: Secondary | ICD-10-CM

## 2019-08-08 DIAGNOSIS — M25062 Hemarthrosis, left knee: Secondary | ICD-10-CM | POA: Diagnosis not present

## 2019-08-08 DIAGNOSIS — K7581 Nonalcoholic steatohepatitis (NASH): Secondary | ICD-10-CM | POA: Diagnosis not present

## 2019-08-08 DIAGNOSIS — E559 Vitamin D deficiency, unspecified: Secondary | ICD-10-CM | POA: Diagnosis not present

## 2019-08-08 DIAGNOSIS — R600 Localized edema: Secondary | ICD-10-CM | POA: Diagnosis not present

## 2019-08-08 DIAGNOSIS — G609 Hereditary and idiopathic neuropathy, unspecified: Secondary | ICD-10-CM

## 2019-08-08 DIAGNOSIS — K219 Gastro-esophageal reflux disease without esophagitis: Secondary | ICD-10-CM

## 2019-08-08 NOTE — Telephone Encounter (Signed)
Transition Care Management Follow-up Telephone Call   Date discharged? 08-07-19               How have you been since you were released from the hospital? She doesn't really feel any better her left knee is still giving her a fit     Do you understand why you were in the hospital? yes   Do you understand the discharge instructions?  yes   Where were you discharged to? [facility] for 3 weeks    Items Reviewed:  Medications reviewed: yes  Allergies reviewed: yes  Dietary changes reviewed: yes  Referrals reviewed: yes   Functional Questionnaire:   Activities of Daily Living (ADLs): pt is taking physical therapy at [facility] as she has to gain the strenght back in that leg      Any transportation issues/concerns?: her son brings her so no issues    Any patient concerns? she doesn't understand what is going on the whole knee feels like its just gone.    Confirmed importance and date/time of follow-up visits scheduled yes     Confirmed with patient if condition begins to worsen call PCP or go to the ER.  Patient was given the office number and encouraged to call back with question or concerns.  : yes

## 2019-08-08 NOTE — Progress Notes (Signed)
Location:    [facility] Room Number: 149/D Place of Service:  SNF (31)   CODE STATUS: Full Code  Allergies  Allergen Reactions  . Cellcept [Mycophenolate Mofetil]     Muscle cramps, mouth soreness, difficulty swallowing, bruising  . Statins     Dx of in  . Ciprofloxacin Hcl Other (See Comments)    Chills, sick, could not tolerate it  . Hydrocodone Nausea Only  . Penicillins Rash    Chief Complaint  Patient presents with  . Hospitalization Follow-up    Hospitalization Follow Up    HPI:  She is a 86 year old woman who has been hospitalized from 08-04-19 through 08-07-19. She had been living at home began having worsening edema in her lower extremities to the point that she was unable to ambulate. She does have a left lower extremity dvt first diagnosed Nov 2020. She has a left knee effusion had a arthrocentesis found to have hemarthrosis will be treated conservatively. She is here for short term rehab with her goal to return back home. There are no reports of constipation; does have bilateral lower extremity pain with movement; no reports of anxiety. She does have a history of falls at home.   She will continue to be followed for her chronic illnesses including: lower extremity edema; peripheral neuropathy; dvt.   Past Medical History:  Diagnosis Date  . Arthritis    spinal stenosis  . Elevated LFTs    secondary to fatty liver, negative work-up in 2011  . Hypercholesterolemia   . Hypertension   . Lumbar spinal stenosis 01/14/2018   L3-4 and L4-5  . Myopathy 02/15/2018  . Osteoarthritis    right knee  . Peripheral neuropathy 07/06/2018  . PONV (postoperative nausea and vomiting)   . Uterine cancer ([place]) 08/2006   grade 1, no recurrence up to 2013    Past Surgical History:  Procedure Laterality Date  . 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 APPENDECTOMY  1973  . BREAST BIOPSY Left 2018   Benign  . CHOLECYSTECTOMY  1973  .  COLONOSCOPY    06/21/2007   OBS:[id number] rectum/Sigmoid diverticula, diminutive hepatic flexure polyp s/p bx. Benign.   . COLONOSCOPY N/A 10/17/2012   Dr. Oneida Alar- moderate diverticulosis was noted in the sigmoid colon, moderate sized internal hemorrhoids. next tcs in10 years  . ESOPHAGOGASTRODUODENOSCOPY N/A 03/20/2016   Dr. Oneida Alar, widely patent Schatzki ring, anemia due to ASA induced erosive gastritis, mild duodenitis. gastric bx benign without H.pylori.   Marland Kitchen FOOT SURGERY  2007   Pins in toes on left foot, 5 to 6 yrs ago  . GIVENS CAPSULE STUDY N/A 03/20/2016   Procedure: GIVENS CAPSULE STUDY;  Surgeon: Danie Binder, MD;  [facility];  Service: Endoscopy;  Laterality: N/A;  . MUSCLE BIOPSY Left 06/07/2018   Procedure: LEFT THIGH MUSCLE BIOPSY;  Surgeon: Erroll Luna, MD;  Location: [place];  Service: General;  Laterality: Left;  . TONSILLECTOMY      Social History   Socioeconomic History  . Marital status: Married    Spouse name: Jeneen Rinks   . Number of children: 3  . Years of education: 84  . Highest education level: 12th grade  Occupational History  . Occupation: retired   Tobacco Use  . Smoking status: Never Smoker  . Smokeless tobacco: Never Used  . Tobacco comment: Never smoked  Substance and Sexual Activity  . Alcohol use: No  .  Drug use: No  . Sexual activity: Not Currently    Birth control/protection: Post-menopausal, Surgical  Other Topics Concern  . Not on file  Social History Narrative   Husband Jeneen Rinks recently passed away   Caffeine use: soda daily   Right handed    Social Determinants of Health   Financial Resource Strain:   . Difficulty of Paying Living Expenses:   Food Insecurity:   . Worried About Charity fundraiser in the Last Year:   . Arboriculturist in the Last Year:   Transportation Needs:   . Film/video editor (Medical):   Marland Kitchen Lack of Transportation (Non-Medical):   Physical Activity:   . Days of Exercise per  Week:   . Minutes of Exercise per Session:   Stress:   . Feeling of Stress :   Social Connections: Unknown  . Frequency of Communication with Friends and Family: Not on file  . Frequency of Social Gatherings with Friends and Family: Not on file  . Attends Religious Services: Not on file  . Active Member of Clubs or Organizations: No  . Attends Archivist Meetings: Not on file  . Marital Status: Widowed  Intimate Partner Violence:   . Fear of Current or Ex-Partner:   . Emotionally Abused:   Marland Kitchen Physically Abused:   . Sexually Abused:    Family History  Problem Relation Age of Onset  . Heart disease Father   . Bladder Cancer Brother        in remission   . Hypertension Sister   . Dementia Sister   . Diabetes type II Sister   . Hypertension Sister   . Aneurysm Sister   . Hypertension Brother   . Arthritis Brother   . Hypertension Son   . Colon cancer Neg Hx       VITAL SIGNS BP (!) 97/56   Pulse 92   Temp (!) 96.8 F (36 C) (Oral)   Resp 20   Ht 5' 6"  (1.676 m)   Wt 153 lb 14.1 oz (69.8 kg)   BMI 24.84 kg/m   Outpatient Encounter Medications as of 08/08/2019  Medication Sig  . acetaminophen (TYLENOL) 500 MG tablet Take 500 mg by mouth every 6 (six) hours as needed.  Marland Kitchen apixaban (ELIQUIS) 5 MG TABS tablet Take 1 tablet (5 mg total) by mouth 2 (two) times daily.  . ergocalciferol (VITAMIN D2) 1.25 MG (50000 UT) capsule Take 1 capsule (50,000 Units total) by mouth once a week. One capsule once weekly  Started takinjg in January 2021, will take for 6 months only and re check)  . ezetimibe (ZETIA) 10 MG tablet TAKE 1 TABLET EVERY DAY  . folic acid (FOLVITE) 1 MG tablet Take 1 tablet (1 mg total) by mouth daily.  . furosemide (LASIX) 40 MG tablet Take 1 tablet (40 mg total) by mouth daily.  Marland Kitchen gabapentin (NEURONTIN) 100 MG capsule Take 1 capsule (100 mg total) by mouth at bedtime.  Marland Kitchen LINZESS 145 MCG CAPS capsule Take 145 mcg by mouth daily as needed.   . magnesium  oxide (MAG-OX) 400 (241.3 Mg) MG tablet Take 1 tablet (400 mg total) by mouth daily.  . NON FORMULARY Fluid restriction of 1.8 liters per day (1729m total) 7-3 = 800 ml 3-11 = 800 ml 11-7 = 175 ml Document totals qshift. Every Shift Day, Evening, Nigh  . NON FORMULARY Diet: _____ Regular, __x____ NAS, _______Consistent Carbohydrate, _______NPO _____Other  . pantoprazole (PROTONIX) 40 MG  tablet Take 1 tablet (40 mg total) by mouth daily.  . Potassium Chloride ER 20 MEQ TBCR Take 20 mEq by mouth every Tuesday, Thursday, Saturday, and Sunday. Take only while on Lasix  . [DISCONTINUED] UNABLE TO FIND Lift chair x 1  DX M48.06, M53.87   No facility-administered encounter medications on file as of 08/08/2019.     SIGNIFICANT DIAGNOSTIC EXAMS  TODAY  08-04-19: chest x-ray: No active disease.   08-05-19: bilateral lower extremity venous doppler:  1. 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 2. No evidence of acute or chronic DVT within the right lower extremity.    LABS REVIEWED TODAY;   08-04-19: wbc 6.6; hgb 8.9; hct 29.2; mcv 97.7 plt 228; glucose 125; bun 33; creat 0.58 k+ 3.2; na++ 142 ca 8.9 liver normal albumin 3.2 mag 1.9  08-07-19: wbc 6.7; hgb 7.8; hct 25.6; mcv 98.5 plt 226; glucose 121; bun 31; creat 0.73 ;k+ 4.2; na++ 142; ca 8.8     Review of Systems  Constitutional: Negative for malaise/fatigue.  Respiratory: Negative for cough and shortness of breath.   Cardiovascular: Negative for chest pain, palpitations and leg swelling.  Gastrointestinal: Negative for abdominal pain, constipation and heartburn.  Musculoskeletal: Negative for back pain, joint pain and myalgias.       Has pain with  movement   Skin: Negative.   Neurological: Negative for dizziness.  Psychiatric/Behavioral: The patient is not nervous/anxious.     Physical Exam Constitutional:      General: She is not in acute distress.    Appearance: She is well-developed. She is not diaphoretic.  Neck:     Thyroid: No thyromegaly.  Cardiovascular:     Rate and Rhythm: Normal rate and regular rhythm.     Heart sounds: Normal heart sounds.  Pulmonary:     Effort: Pulmonary effort is normal. No respiratory distress.     Breath sounds: Normal breath sounds.  Abdominal:     General: Bowel sounds are normal. There is no distension.     Palpations: Abdomen is soft.     Tenderness: There is no abdominal tenderness.  Musculoskeletal:        General: Normal range of motion.     Left lower leg: Edema present.  Lymphadenopathy:     Cervical: No cervical adenopathy.  Skin:    General: Skin is warm and dry.  Neurological:     Mental Status: She is alert. Mental status is at baseline.  Psychiatric:        Mood and Affect: Mood normal.      ASSESSMENT/ PLAN:  TODAY  1. Bilateral lower extremity edema: is stable will continue lasix 40 mg daily with k+ 10 meq 4 days per week 1800 cc fluid restriction   2. 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 Gastroesophageal reflux disease without esophagitis: is stable will continue protonix 40 mg daily  4. NASH: is stable will monitor   5. Idiopathic peripheral neuropathy: is stable will continue gabapentin 100 mg nightly   6. Hemarthrosis of left knee: is stable is status post arthrocentesis   7. Slow transit constipation: is stable will continue  linzess 145 mcg daily as needed  8. Hyperlipidemia LDL goal <100: is stable will continue zetia 10 mg daily   9. Vitamin D deficiency: is stable will continue vit D 50,000 units weekly   10. Hypomagnesemia is stable will continue mag ox  400 mg daily    11. Chronic anemia: without change hgb 7.8 will monitor   Will check bmp hgb hct    MD is aware of resident's narcotic use and is in agreement with current plan of care. We will attempt to wean resident as appropriate.  Ok Edwards NP [facility]  Contact [phone] Monday through Friday 8am- 5pm  After hours call [phone]

## 2019-08-09 ENCOUNTER — Non-Acute Institutional Stay (SKILLED_NURSING_FACILITY): Payer: Medicare HMO | Admitting: Internal Medicine

## 2019-08-09 ENCOUNTER — Encounter: Payer: Self-pay | Admitting: Internal Medicine

## 2019-08-09 DIAGNOSIS — R600 Localized edema: Secondary | ICD-10-CM

## 2019-08-09 DIAGNOSIS — R531 Weakness: Secondary | ICD-10-CM | POA: Diagnosis not present

## 2019-08-09 DIAGNOSIS — I10 Essential (primary) hypertension: Secondary | ICD-10-CM | POA: Diagnosis not present

## 2019-08-09 DIAGNOSIS — I82532 Chronic embolism and thrombosis of left popliteal vein: Secondary | ICD-10-CM

## 2019-08-09 NOTE — Assessment & Plan Note (Signed)
Subjective improvement in the edema with present diuretic regimen.  Fluid restriction and sodium restriction have been implemented.  She has been encouraged to elevate the legs as much as possible.

## 2019-08-09 NOTE — Progress Notes (Signed)
NURSING HOME LOCATION: [place] [facility] Room 149/day CODE STATUS: Full code  PCP: [name], MD   This is a comprehensive admission note to [facility] performed on this date less than 30 days from date of admission. Included are preadmission medical/surgical history; reconciled medication list; family history; social history and comprehensive review of systems.  Corrections and additions to the records were documented. Comprehensive physical exam was also performed. Additionally a clinical summary was entered for each active diagnosis pertinent to this admission in the Problem List to enhance continuity of care.  HPI: Patient was hospitalized 5/14-5//17/2021 admitted with effusion of the left knee and bilateral lower extremity edema,in context of a history of DVT in November 2020. Due to subsequent worsening of bilateral lower extremity edema and pain in the left knee/popliteal fossa; she was unable to walk or conduct ADLs. Venous ultrasound revealed persistent DVT; Eliquis was continued. Labs revealed significant anemia with hemoglobin 8.9.  INR was 1.4.  Potassium was 3.2.  The remainder of the labs were essentially normal including creatinine 0.58.  Albumin is 3.2 and total protein 6.  She has a history of NASH; AST was 44.  ALT, alk phos, bilirubin were normal.CXR revealed no pulmonary edema or cardiac enlargement. Lower extremity edema improved with diuresis.  Elevation of lower extremities was recommended.  BNP was normal.  It was suspect that a component of venous stasis was present.  TED stockings or Ace wraps were not introduced because of chronic DVT.  Vascular surgery will continue to follow the patient and employ compression therapy. Prior to  this admission the patient had been seen in the ED for pain in the left knee.  Arthrocentesis revealed bloody fluid.  Orthopedist Dr. Aline Brochure felt that hemarthrosis required only supportive management. This clinical picture  was in the context of generalized weakness which necessitated use of the walker for ambulation.  PT/OT was recommended at SNF post discharge. She was discharged on fluid restriction of 1.8 L/day and no added salt diet.  Past medical and surgical history: Includes dyslipidemia, essential hypertension, chronic anemia, GERD, lumbar spinal stenosis, history of cervical cancer, and peripheral neuropathy. Surgeries include abdominal hysterectomy and cholecystectomy.  Social history: Nondrinker: Never smoked.  Family history: Reviewed   Review of systems: She is alert and oriented and very communicative.  She is complaining of frontal headache which she relates to insomnia last night.  She states that she found the mattress very uncomfortable and could not sleep.She denies nocturnal dyspnea.  She feels that the edema is improved although she still has residual soreness in the left lower extremity.  When the edema was most severe she actually had weeping from the shins. She states that she has partials due to poor dentition which was related to the adverse effects of a medication.  She seems to indicate it may been a statin as she was also having muscle discomfort.  She is on ezetimibe. She has had constipation recently. She states that her feet feel cold and tingle intermittently.  Constitutional: No fever, significant weight change, fatigue  Eyes: No redness, discharge, pain, vision change ENT/mouth: No nasal congestion, purulent discharge, earache, change in hearing, sore throat  Cardiovascular: No chest pain, palpitations  Respiratory: No cough, sputum production, hemoptysis, DOE, significant snoring, apnea  Gastrointestinal: No heartburn, dysphagia, abdominal pain, nausea /vomiting, rectal bleeding, melena Genitourinary: No dysuria, hematuria, pyuria, incontinence, nocturia Musculoskeletal: No joint stiffness, joint swelling, weakness, pain Dermatologic: No rash, pruritus Neurologic: No  dizziness, syncope,  seizures Psychiatric: No significant anxiety, depression, insomnia, anorexia Endocrine: No change in hair/nails, excessive thirst, excessive hunger, excessive urination  Hematologic/lymphatic: No significant bruising, lymphadenopathy, abnormal bleeding Allergy/immunology: No itchy/watery eyes, significant sneezing, urticaria, angioedema  Physical exam:  Pertinent or positive findings: She appears younger than her stated age.  As noted she is articulate and communicative.  The eyebrows are decreased laterally.  She is not wearing the upper or lower partials.  There is a raspy grade 1 systolic murmur at the right base.  Pedal pulses are decreased.  She has 1+ pitting edema.  There is a Band-Aid over the right shin which she states covers a blister.  There are some circular scars over the left inferior shin related to prior blistering. Bevelyn Buckles' sign is positive in the left calf.  General appearance: Adequately nourished; no acute distress, increased work of breathing is present.   Lymphatic: No lymphadenopathy about the head, neck, axilla. Eyes: No conjunctival inflammation or lid edema is present. There is no scleral icterus. Ears:  External ear exam shows no significant lesions or deformities.   Nose:  External nasal examination shows no deformity or inflammation. Nasal mucosa are pink and moist without lesions, exudates Oral exam: Lips and gums are healthy appearing.There is no oropharyngeal erythema or exudate. Neck:  No thyromegaly, masses, tenderness noted.    Heart:  Normal rate and regular rhythm. S1 and S2 normal without gallop, click, rub.  Lungs: Chest clear to auscultation without wheezes, rhonchi, rales, rubs. Abdomen: Bowel sounds are normal.  Abdomen is soft and nontender with no organomegaly, hernias, masses. GU: Deferred  Extremities:  No cyanosis, clubbing Neurologic exam: Balance, Rhomberg, finger to nose testing could not be completed due to clinical  state Skin: Warm & dry w/o tenting. No significant rash.  See clinical summary under each active problem in the Problem List with associated updated therapeutic plan

## 2019-08-09 NOTE — Assessment & Plan Note (Signed)
Ultrasound reveals that the deep venous thrombosis is persistent.  Lifelong anticoagulation critical.

## 2019-08-09 NOTE — Assessment & Plan Note (Signed)
PT/OT at SNF.

## 2019-08-09 NOTE — Assessment & Plan Note (Signed)
Blood pressure is actually low off any antihypertensive medications at this time.

## 2019-08-09 NOTE — Patient Instructions (Signed)
See assessment and plan under each diagnosis in the problem list and acutely for this visit 

## 2019-08-10 ENCOUNTER — Other Ambulatory Visit (HOSPITAL_COMMUNITY)
Admission: RE | Admit: 2019-08-10 | Discharge: 2019-08-10 | Disposition: A | Discharge status: Home / Self Care | Payer: Medicare HMO | Source: Skilled Nursing Facility | Attending: Adult Health | Admitting: Adult Health

## 2019-08-10 LAB — CBC
HCT: 24.4 % — ABNORMAL LOW (ref 36.0–46.0)
Hemoglobin: 7.4 g/dL — ABNORMAL LOW (ref 12.0–15.0)
MCH: 29.6 pg (ref 26.0–34.0)
MCHC: 30.3 g/dL (ref 30.0–36.0)
MCV: 97.6 fL (ref 80.0–100.0)
Platelets: 271 10*3/uL (ref 150–400)
RBC: 2.5 MIL/uL — ABNORMAL LOW (ref 3.87–5.11)
RDW: 13.1 % (ref 11.5–15.5)
WBC: 5.4 10*3/uL (ref 4.0–10.5)
nRBC: 0 % (ref 0.0–0.2)

## 2019-08-11 ENCOUNTER — Ambulatory Visit (INDEPENDENT_AMBULATORY_CARE_PROVIDER_SITE_OTHER): Payer: Medicare HMO | Admitting: Orthopedic Surgery

## 2019-08-11 ENCOUNTER — Encounter: Payer: Self-pay | Admitting: Orthopedic Surgery

## 2019-08-11 VITALS — BP 112/70 | HR 88 | Ht 66.0 in

## 2019-08-11 DIAGNOSIS — M25562 Pain in left knee: Secondary | ICD-10-CM | POA: Diagnosis not present

## 2019-08-11 DIAGNOSIS — M25062 Hemarthrosis, left knee: Secondary | ICD-10-CM | POA: Insufficient documentation

## 2019-08-11 DIAGNOSIS — M2500 Hemarthrosis, unspecified joint: Secondary | ICD-10-CM | POA: Diagnosis not present

## 2019-08-11 NOTE — Progress Notes (Signed)
Chief Complaint  Patient presents with  . Knee Pain    left knee painful and swollen    History Khylei is 89 she has a blood clot she is on Eliquis she had a spontaneous bleed into her left knee I just saw her May 3 she had an effusion I thought it to be bloody and it was not big enough to tap she is back today complaining of left knee pain and left knee swelling still on Eliquis of course  System review no fever no erythema around the joint to suggest infection  She does have peripheral edema both legs left greater than right  BP 112/70   Pulse 88   Ht 5' 6"  (1.676 m)   BMI 24.84 kg/m   She is awake and alert she is here with her daughter her left knee has an effusion it feels like a lot of boggy synovium but we agreed to go ahead and aspirate today says she came back 3 weeks after initial evaluation  Lateral approach suprapatellar typical sterile technique we got back about 15 cc of blood.  We used some lidocaine to try to break up some of the clot got back another 5 to 10 cc of blood  We injected with steroid to help with the synovitis   Procedure note injection and aspiration left knee joint  Verbal consent was obtained to aspirate and inject the left knee joint   Timeout was completed to confirm the site of aspiration and injection  An 18-gauge needle was used to aspirate the left knee joint from a suprapatellar lateral approach.  The medications used were 40 mg of Depo-Medrol and 1% lidocaine 3 cc  Anesthesia was provided by ethyl chloride and the skin was prepped with alcohol.  After cleaning the skin with alcohol an 18-gauge needle was used to aspirate the right knee joint.  We obtained 15 cc of fluid bloody  We followed this by injection of 40 mg of Depo-Medrol and 3 cc 1% lidocaine.  There were no complications. A sterile bandage was applied.    Recommend TED hose  Bracing  Come back in 5 to 6weeks.  This is most likely going to be an ongoing thing because of  the Eliquis.  Ted hose

## 2019-08-14 ENCOUNTER — Ambulatory Visit: Payer: Medicare HMO | Admitting: Physician Assistant

## 2019-08-14 ENCOUNTER — Other Ambulatory Visit (HOSPITAL_COMMUNITY)
Admission: RE | Admit: 2019-08-14 | Discharge: 2019-08-14 | Disposition: A | Discharge status: Home / Self Care | Payer: Medicare HMO | Source: Skilled Nursing Facility | Attending: Adult Health | Admitting: Adult Health

## 2019-08-14 VITALS — BP 114/67 | HR 75 | Temp 97.9°F | Resp 16 | Ht 65.0 in | Wt 155.0 lb

## 2019-08-14 DIAGNOSIS — I82532 Chronic embolism and thrombosis of left popliteal vein: Secondary | ICD-10-CM | POA: Diagnosis not present

## 2019-08-14 DIAGNOSIS — R600 Localized edema: Secondary | ICD-10-CM | POA: Diagnosis not present

## 2019-08-14 DIAGNOSIS — I10 Essential (primary) hypertension: Secondary | ICD-10-CM | POA: Insufficient documentation

## 2019-08-14 LAB — BASIC METABOLIC PANEL
Anion gap: 9 (ref 5–15)
BUN: 38 mg/dL — ABNORMAL HIGH (ref 8–23)
CO2: 29 mmol/L (ref 22–32)
Calcium: 9.1 mg/dL (ref 8.9–10.3)
Chloride: 104 mmol/L (ref 98–111)
Creatinine, Ser: 0.82 mg/dL (ref 0.44–1.00)
GFR calc Af Amer: >60 mL/min (ref >60)
GFR calc non Af Amer: >60 mL/min (ref >60)
Glucose, Bld: 92 mg/dL (ref 70–99)
Potassium: 4 mmol/L (ref 3.5–5.1)
Sodium: 142 mmol/L (ref 135–145)

## 2019-08-14 LAB — HEMOGLOBIN AND HEMATOCRIT, BLOOD
HCT: 26 % — ABNORMAL LOW (ref 36.0–46.0)
Hemoglobin: 7.7 g/dL — ABNORMAL LOW (ref 12.0–15.0)

## 2019-08-14 NOTE — Progress Notes (Signed)
Office Note     CC:  follow up Requesting Provider:  Fayrene Helper, MD  HPI: [name] [name] is a 86 y.o. (1933/03/17) female who presents to clinic for recheck of chronic LLE DVT and BLE edema.  She currently resides in a skilled nursing facility having been discharged from the hospital recently.  She returned to the hospital due to an increase in swelling of bilateral lower extremities.  This was improved with diuresis and leg elevation.  Venous duplex was checked of left lower extremity during her inpatient stay which demonstrated chronic DVT of left lower extremity involving her tibial veins and extending up to her popliteal vein.  Patient states she is also having trouble with hemarthrosis of left knee.  She states that her orthopedic surgeon believes that this will continue as long as she requires Eliquis.  She has an appointment with her hematologist on June 14 and believes that she may be able to discontinue her Eliquis at that time based on discussions at their prior appointments.  Patient admittedly has not been wearing compression or elevating her legs much during the day.  She believes she is progressing well with physical therapy however is limited by her left knee pain.  She denies any new venous ulcerations.  It should also be noted that she has required Unna boots in the past.  She denies any shortness of breath or pleuritic chest pain.  She also denies tobacco use.  She is accompanied today by her daughter.   Past Medical History:  Diagnosis Date  . Arthritis    spinal stenosis  . Elevated LFTs    secondary to fatty liver, negative work-up in 2011  . Hypercholesterolemia   . Hypertension   . Lumbar spinal stenosis 01/14/2018   L3-4 and L4-5  . Myopathy 02/15/2018  . Osteoarthritis    right knee  . Peripheral neuropathy 07/06/2018  . PONV (postoperative nausea and vomiting)   . Uterine cancer ([place]) 08/2006   grade 1, no recurrence up to 2013    Past Surgical History:    Procedure Laterality Date  . 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 APPENDECTOMY  1973  . BREAST BIOPSY Left 2018   Benign  . CHOLECYSTECTOMY  1973  . COLONOSCOPY    06/21/2007   GYB:WLSLHT rectum/Sigmoid diverticula, diminutive hepatic flexure polyp s/p bx. Benign.   . COLONOSCOPY N/A 10/17/2012   Dr. Oneida Alar- moderate diverticulosis was noted in the sigmoid colon, moderate sized internal hemorrhoids. next tcs in10 years  . ESOPHAGOGASTRODUODENOSCOPY N/A 03/20/2016   Dr. Oneida Alar, widely patent Schatzki ring, anemia due to ASA induced erosive gastritis, mild duodenitis. gastric bx benign without H.pylori.   Marland Kitchen FOOT SURGERY  2007   Pins in toes on left foot, 5 to 6 yrs ago  . GIVENS CAPSULE STUDY N/A 03/20/2016   Procedure: GIVENS CAPSULE STUDY;  Surgeon: Danie Binder, MD;  [facility];  Service: Endoscopy;  Laterality: N/A;  . MUSCLE BIOPSY Left 06/07/2018   Procedure: LEFT THIGH MUSCLE BIOPSY;  Surgeon: Erroll Luna, MD;  Location: [facility];  Service: General;  Laterality: Left;  . TONSILLECTOMY      Social History   Socioeconomic History  . Marital status: Married    Spouse name: Jeneen Rinks   . Number of children: 3  . Years of education: 52  . Highest education level: 12th grade  Occupational History  . Occupation: retired   Tobacco Use  .  Smoking status: Never Smoker  . Smokeless tobacco: Never Used  . Tobacco comment: Never smoked  Substance and Sexual Activity  . Alcohol use: No  . Drug use: No  . Sexual activity: Not Currently    Birth control/protection: Post-menopausal, Surgical  Other Topics Concern  . Not on file  Social History Narrative   Husband Jeneen Rinks recently passed away   Caffeine use: soda daily   Right handed    Social Determinants of Health   Financial Resource Strain:   . Difficulty of Paying Living Expenses:   Food Insecurity:   . Worried About Charity fundraiser in the Last Year:   . Academic librarian in the Last Year:   Transportation Needs:   . Film/video editor (Medical):   Marland Kitchen Lack of Transportation (Non-Medical):   Physical Activity:   . Days of Exercise per Week:   . Minutes of Exercise per Session:   Stress:   . Feeling of Stress :   Social Connections: Unknown  . Frequency of Communication with Friends and Family: Not on file  . Frequency of Social Gatherings with Friends and Family: Not on file  . Attends Religious Services: Not on file  . Active Member of Clubs or Organizations: No  . Attends Archivist Meetings: Not on file  . Marital Status: Widowed  Intimate Partner Violence:   . Fear of Current or Ex-Partner:   . Emotionally Abused:   Marland Kitchen Physically Abused:   . Sexually Abused:     Family History  Problem Relation Age of Onset  . Heart disease Father   . Bladder Cancer Brother        in remission   . Hypertension Sister   . Dementia Sister   . Diabetes type II Sister   . Hypertension Sister   . Aneurysm Sister   . Hypertension Brother   . Arthritis Brother   . Hypertension Son   . Colon cancer Neg Hx     No current outpatient medications on file.   No current facility-administered medications for this visit.    Allergies  Allergen Reactions  . Cellcept [Mycophenolate Mofetil]     Muscle cramps, mouth soreness, difficulty swallowing, bruising  . Statins     Dx of in  . Ciprofloxacin Hcl Other (See Comments)    Chills, sick, could not tolerate it  . Hydrocodone Nausea Only  . Penicillins Rash     REVIEW OF SYSTEMS:   [X]  denotes positive finding, [ ]  denotes negative finding Cardiac  Comments:  Chest pain or chest pressure:    Shortness of breath upon exertion:    Short of breath when lying flat:    Irregular heart rhythm:        Vascular    Pain in calf, thigh, or hip brought on by ambulation:    Pain in feet at night that wakes you up from your sleep:     Blood clot in your veins:    Leg swelling:           Pulmonary    Oxygen at home:    Productive cough:     Wheezing:         Neurologic    Sudden weakness in arms or legs:     Sudden numbness in arms or legs:     Sudden onset of difficulty speaking or slurred speech:    Temporary loss of vision in one eye:     Problems  with dizziness:         Gastrointestinal    Blood in stool:     Vomited blood:         Genitourinary    Burning when urinating:     Blood in urine:        Psychiatric    Major depression:         Hematologic    Bleeding problems:    Problems with blood clotting too easily:        Skin    Rashes or ulcers:        Constitutional    Fever or chills:      PHYSICAL EXAMINATION:  Vitals:   08/14/19 1309  BP: 114/67  Pulse: 75  Resp: 16  Temp: 97.9 F (36.6 C)  TempSrc: Temporal  SpO2: 100%  Weight: 155 lb (70.3 kg)  Height: 5' 5"  (1.651 m)    General:  WDWN in NAD; vital signs documented above Gait: Not observed HENT: WNL, normocephalic Pulmonary: normal non-labored breathing , without Rales, rhonchi,  wheezing Cardiac: regular HR, Abdomen: soft, NT, no masses Skin: without rashes Vascular Exam/Pulses: feet are symmetrically warm to touch Extremities: Pitting edema of left lower extremity to the level of the proximal shin; right lower extremity with pitting edema however only to the level of the ankle; no venous ulcerations or nonhealing wounds of bilateral lower extremities; no varicosities noted Musculoskeletal: no muscle wasting or atrophy  Neurologic: A&O X 3;  No focal weakness or paresthesias are detected Psychiatric:  The pt has Normal affect.   Non-Invasive Vascular Imaging:   Venous duplex of left lower extremity reviewed from 08/04/2019 demonstrating chronic DVT left lower extremity of tibial veins extending up to popliteal vein    ASSESSMENT/PLAN:: 86 y.o. female with chronic DVT of left lower extremity and increasing swelling of bilateral lower extremities  Patient's main  complaint is of pain in her left knee which is keeping her immobile.  Pain is likely due to reaccumulation and ongoing hemarthrosis.  She has been evaluated by orthopedic surgery who state that this will likely continue to some degree until her Eliquis is discontinued.  She has a follow-up appointment with her hematologist on June 14 at which time she believes she may be able to discontinue her Eliquis based on initial consultation.  From a vascular surgery standpoint I do not believe there is anything further to offer.  Based on recent venous duplex, DVT only extends up to popliteal vein.  We would not offer mechanical thrombectomy in this situation.  I stressed the importance of wearing some form of compression of bilateral lower extremities daily.  She also needs to make an effort to elevate her legs during the day.  I also believe it is reasonable to increase diuretic as long as her lab work permits.  We will need to return to unna boots if venous ulcers recur.  Her daughter is present during today's visit and states she will make sure her mother is elevating her legs and wearing compression and will also make sure that nursing facility staff is encouraging these activities as well.  Nothing further to add from a vascular standpoint.  She may follow-up on an as-needed basis.   Dagoberto Ligas, PA-C [facility] [phone]  Clinic MD:   Dr. Trula Slade

## 2019-08-15 ENCOUNTER — Encounter: Payer: Self-pay | Admitting: Adult Health

## 2019-08-15 ENCOUNTER — Non-Acute Institutional Stay (SKILLED_NURSING_FACILITY): Payer: Medicare HMO | Admitting: Adult Health

## 2019-08-15 DIAGNOSIS — L03116 Cellulitis of left lower limb: Secondary | ICD-10-CM

## 2019-08-15 NOTE — Progress Notes (Signed)
Location:    [facility] Room Number: 149/D Place of Service:  SNF (31)   CODE STATUS: Full Code  Allergies  Allergen Reactions  . Cellcept [Mycophenolate Mofetil]     Muscle cramps, mouth soreness, difficulty swallowing, bruising  . Statins     Dx of in  . Ciprofloxacin Hcl Other (See Comments)    Chills, sick, could not tolerate it  . Hydrocodone Nausea Only  . Penicillins Rash    Chief Complaint  Patient presents with  . Acute Visit    Left Lower Extremity Edema     HPI:  She has left lower extremity redness; edema  and warmth present she denies any unusual pain present. There are no reports of fevers present. She does have a left lower extremity dvt.   Past Medical History:  Diagnosis Date  . Arthritis    spinal stenosis  . Elevated LFTs    secondary to fatty liver, negative work-up in 2011  . Hypercholesterolemia   . Hypertension   . Lumbar spinal stenosis 01/14/2018   L3-4 and L4-5  . Myopathy 02/15/2018  . Osteoarthritis    right knee  . Peripheral neuropathy 07/06/2018  . PONV (postoperative nausea and vomiting)   . Uterine cancer ([place]) 08/2006   grade 1, no recurrence up to 2013    Past Surgical History:  Procedure Laterality Date  . 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 APPENDECTOMY  1973  . BREAST BIOPSY Left 2018   Benign  . CHOLECYSTECTOMY  1973  . COLONOSCOPY    06/21/2007   OEU:MPNTIR rectum/Sigmoid diverticula, diminutive hepatic flexure polyp s/p bx. Benign.   . COLONOSCOPY N/A 10/17/2012   Dr. Oneida Alar- moderate diverticulosis was noted in the sigmoid colon, moderate sized internal hemorrhoids. next tcs in10 years  . ESOPHAGOGASTRODUODENOSCOPY N/A 03/20/2016   Dr. Oneida Alar, widely patent Schatzki ring, anemia due to ASA induced erosive gastritis, mild duodenitis. gastric bx benign without H.pylori.   Marland Kitchen FOOT SURGERY  2007   Pins in toes on left foot, 5 to 6 yrs ago  . GIVENS CAPSULE STUDY N/A  03/20/2016   Procedure: GIVENS CAPSULE STUDY;  Surgeon: Danie Binder, MD;  [facility];  Service: Endoscopy;  Laterality: N/A;  . MUSCLE BIOPSY Left 06/07/2018   Procedure: LEFT THIGH MUSCLE BIOPSY;  Surgeon: Erroll Luna, MD;  Location: [facility];  Service: General;  Laterality: Left;  . TONSILLECTOMY      Social History   Socioeconomic History  . Marital status: Married    Spouse name: Jeneen Rinks   . Number of children: 3  . Years of education: 52  . Highest education level: 12th grade  Occupational History  . Occupation: retired   Tobacco Use  . Smoking status: Never Smoker  . Smokeless tobacco: Never Used  . Tobacco comment: Never smoked  Substance and Sexual Activity  . Alcohol use: No  . Drug use: No  . Sexual activity: Not Currently    Birth control/protection: Post-menopausal, Surgical  Other Topics Concern  . Not on file  Social History Narrative   Husband Jeneen Rinks recently passed away   Caffeine use: soda daily   Right handed    Social Determinants of Health   Financial Resource Strain:   . Difficulty of Paying Living Expenses:   Food Insecurity:   . Worried About Charity fundraiser in the Last Year:   . [place] in the Last  Year:   Transportation Needs:   . Film/video editor (Medical):   Marland Kitchen Lack of Transportation (Non-Medical):   Physical Activity:   . Days of Exercise per Week:   . Minutes of Exercise per Session:   Stress:   . Feeling of Stress :   Social Connections: Unknown  . Frequency of Communication with Friends and Family: Not on file  . Frequency of Social Gatherings with Friends and Family: Not on file  . Attends Religious Services: Not on file  . Active Member of Clubs or Organizations: No  . Attends Archivist Meetings: Not on file  . Marital Status: Widowed  Intimate Partner Violence:   . Fear of Current or Ex-Partner:   . Emotionally Abused:   Marland Kitchen Physically Abused:   . Sexually Abused:      Family History  Problem Relation Age of Onset  . Heart disease Father   . Bladder Cancer Brother        in remission   . Hypertension Sister   . Dementia Sister   . Diabetes type II Sister   . Hypertension Sister   . Aneurysm Sister   . Hypertension Brother   . Arthritis Brother   . Hypertension Son   . Colon cancer Neg Hx       VITAL SIGNS BP 102/70   Pulse 91   Temp (!) 97.2 F (36.2 C) (Oral)   Resp 20   Ht 5' 4"  (1.626 m)   Wt 152 lb 3.2 oz (69 kg)   BMI 26.13 kg/m   Outpatient Encounter Medications as of 08/15/2019  Medication Sig  . acetaminophen (TYLENOL) 650 MG CR tablet Take 650 mg by mouth every 6 (six) hours.  Marland Kitchen apixaban (ELIQUIS) 5 MG TABS tablet Take 1 tablet (5 mg total) by mouth 2 (two) times daily.  . ergocalciferol (VITAMIN D2) 1.25 MG (50000 UT) capsule Take 1 capsule (50,000 Units total) by mouth once a week. One capsule once weekly  Started takinjg in January 2021, will take for 6 months only and re check)  . ezetimibe (ZETIA) 10 MG tablet TAKE 1 TABLET EVERY DAY  . folic acid (FOLVITE) 1 MG tablet Take 1 tablet (1 mg total) by mouth daily.  . furosemide (LASIX) 40 MG tablet Take 1 tablet (40 mg total) by mouth daily.  Marland Kitchen gabapentin (NEURONTIN) 100 MG capsule Take 1 capsule (100 mg total) by mouth at bedtime.  Marland Kitchen LINZESS 145 MCG CAPS capsule Take 145 mcg by mouth daily as needed.   . magnesium oxide (MAG-OX) 400 (241.3 Mg) MG tablet Take 1 tablet (400 mg total) by mouth daily.  . NON FORMULARY Fluid restriction of 1.8 liters per day (1752m total) 7-3 = 800 ml 3-11 = 800 ml 11-7 = 175 ml Document totals qshift. Every Shift Day, Evening, Nigh  . NON FORMULARY Diet: _____ Regular, __x____ NAS, _______Consistent Carbohydrate, _______NPO _____Other  . pantoprazole (PROTONIX) 40 MG tablet Take 1 tablet (40 mg total) by mouth daily.  . Potassium Chloride ER 20 MEQ TBCR Take 20 mEq by mouth every Tuesday, Thursday, Saturday, and Sunday. Take only  while on Lasix  . [DISCONTINUED] acetaminophen (TYLENOL) 500 MG tablet Take 500 mg by mouth every 6 (six) hours as needed.   No facility-administered encounter medications on file as of 08/15/2019.     SIGNIFICANT DIAGNOSTIC EXAMS   PREVIOUS  08-04-19: chest x-ray: No active disease.   08-05-19: bilateral lower extremity venous doppler:  1. Occlusive DVT involving  the left popliteal vein with extension to involve the left posterior tibial vein and one of the paired left peroneal veins, similar to the 01/2019 examination and thus presumably chronic in etiology, though in the absence of interval prior examinations, an acute on chronic process is not excluded. 2. No evidence of acute or chronic DVT within the right lower extremity.  NO NEW EXAMS     LABS REVIEWED PREVIOUS  08-04-19: wbc 6.6; hgb 8.9; hct 29.2; mcv 97.7 plt 228; glucose 125; bun 33; creat 0.58 k+ 3.2; na++ 142 ca 8.9 liver normal albumin 3.2 mag 1.9  08-07-19: wbc 6.7; hgb 7.8; hct 25.6; mcv 98.5 plt 226; glucose 121; bun 31; creat 0.73 ;k+ 4.2; na++ 142; ca 8.8   NO NEW LABS.   Review of Systems  Constitutional: Negative for malaise/fatigue.  Respiratory: Negative for cough and shortness of breath.   Cardiovascular: Positive for leg swelling. Negative for chest pain and palpitations.  Gastrointestinal: Negative for abdominal pain, constipation and heartburn.  Musculoskeletal: Negative for back pain, joint pain and myalgias.  Skin: Negative.   Neurological: Negative for dizziness.  Psychiatric/Behavioral: The patient is not nervous/anxious.     Physical Exam Constitutional:      General: She is not in acute distress.    Appearance: She is well-developed. She is not diaphoretic.  Neck:     Thyroid: No thyromegaly.  Cardiovascular:     Rate and Rhythm: Normal rate and regular rhythm.     Pulses: Normal pulses.     Heart sounds: Normal heart sounds.  Pulmonary:     Effort: Pulmonary effort is normal. No  respiratory distress.     Breath sounds: Normal breath sounds.  Abdominal:     General: Bowel sounds are normal. There is no distension.     Palpations: Abdomen is soft.     Tenderness: There is no abdominal tenderness.  Musculoskeletal:        General: Normal range of motion.     Cervical back: Neck supple.     Right lower leg: No edema.     Left lower leg: Edema present.     Comments: Left lower extremity is red; warm; swelling tenderness present   Lymphadenopathy:     Cervical: No cervical adenopathy.  Skin:    General: Skin is warm and dry.  Neurological:     Mental Status: She is alert. Mental status is at baseline.  Psychiatric:        Mood and Affect: Mood normal.        ASSESSMENT/ PLAN:  TODAY  1. Left lower extremity cellulitis Is worse will begin doxycycline 100 mg twice daily through 08-23-19. Will monitor her status.   MD is aware of resident's narcotic use and is in agreement with current plan of care. We will attempt to wean resident as appropriate.  Ok Edwards NP [facility]  Contact [phone] Monday through Friday 8am- 5pm  After hours call [phone]

## 2019-08-16 ENCOUNTER — Encounter: Payer: Self-pay | Admitting: Adult Health

## 2019-08-16 ENCOUNTER — Non-Acute Institutional Stay (SKILLED_NURSING_FACILITY): Payer: Medicare HMO | Admitting: Adult Health

## 2019-08-16 DIAGNOSIS — R600 Localized edema: Secondary | ICD-10-CM | POA: Diagnosis not present

## 2019-08-16 DIAGNOSIS — K219 Gastro-esophageal reflux disease without esophagitis: Secondary | ICD-10-CM

## 2019-08-16 DIAGNOSIS — I82532 Chronic embolism and thrombosis of left popliteal vein: Secondary | ICD-10-CM | POA: Diagnosis not present

## 2019-08-16 NOTE — Progress Notes (Signed)
Location:    [place] Room Number: 149/D Place of Service:  SNF (31)   CODE STATUS: Full Code  Allergies  Allergen Reactions  . Cellcept [Mycophenolate Mofetil]     Muscle cramps, mouth soreness, difficulty swallowing, bruising  . Statins     Dx of in  . Ciprofloxacin Hcl Other (See Comments)    Chills, sick, could not tolerate it  . Hydrocodone Nausea Only  . Penicillins Rash    Chief Complaint  Patient presents with  . Short Term Rehab           Bilateral lower extremity edema:    Chronic deep vein thrombosis of popliteal vein of left lower extremity: Gastroesophageal reflux disease without esophagitis:  Weekly follow up for the first 30 days post hospitalization.     HPI:  She is a 86 year [name] short term rehab patient being seen for the management of her chronic illnesses: lower extremity edema; dvt; gerd. There are no reports of uncontrolled pain; her edema is without change; no reports of anxiety.   Past Medical History:  Diagnosis Date  . Arthritis    spinal stenosis  . Elevated LFTs    secondary to fatty liver, negative work-up in 2011  . Hypercholesterolemia   . Hypertension   . Lumbar spinal stenosis 01/14/2018   L3-4 and L4-5  . Myopathy 02/15/2018  . Osteoarthritis    right knee  . Peripheral neuropathy 07/06/2018  . PONV (postoperative nausea and vomiting)   . Uterine cancer ([place]) 08/2006   grade 1, no recurrence up to 2013    Past Surgical History:  Procedure Laterality Date  . 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 APPENDECTOMY  1973  . BREAST BIOPSY Left 2018   Benign  . CHOLECYSTECTOMY  1973  . COLONOSCOPY    06/21/2007   WER:[id number] rectum/Sigmoid diverticula, diminutive hepatic flexure polyp s/p bx. Benign.   . COLONOSCOPY N/A 10/17/2012   Dr. Oneida Alar- moderate diverticulosis was noted in the sigmoid colon, moderate sized internal hemorrhoids. next tcs in10 years  . ESOPHAGOGASTRODUODENOSCOPY N/A  03/20/2016   Dr. Oneida Alar, widely patent Schatzki ring, anemia due to ASA induced erosive gastritis, mild duodenitis. gastric bx benign without H.pylori.   Marland Kitchen FOOT SURGERY  2007   Pins in toes on left foot, 5 to 6 yrs ago  . GIVENS CAPSULE STUDY N/A 03/20/2016   Procedure: GIVENS CAPSULE STUDY;  Surgeon: Danie Binder, MD;  [facility];  Service: Endoscopy;  Laterality: N/A;  . MUSCLE BIOPSY Left 06/07/2018   Procedure: LEFT THIGH MUSCLE BIOPSY;  Surgeon: Erroll Luna, MD;  Location: [facility];  Service: General;  Laterality: Left;  . TONSILLECTOMY      Social History   Socioeconomic History  . Marital status: Married    Spouse name: Jeneen Rinks   . Number of children: 3  . Years of education: 10  . Highest education level: 12th grade  Occupational History  . Occupation: retired   Tobacco Use  . Smoking status: Never Smoker  . Smokeless tobacco: Never Used  . Tobacco comment: Never smoked  Substance and Sexual Activity  . Alcohol use: No  . Drug use: No  . Sexual activity: Not Currently    Birth control/protection: Post-menopausal, Surgical  Other Topics Concern  . Not on file  Social History Narrative   Husband Jeneen Rinks recently passed away   Caffeine use: soda daily   Right handed  Social Determinants of Health   Financial Resource Strain:   . Difficulty of Paying Living Expenses:   Food Insecurity:   . Worried About Charity fundraiser in the Last Year:   . Arboriculturist in the Last Year:   Transportation Needs:   . Film/video editor (Medical):   Marland Kitchen Lack of Transportation (Non-Medical):   Physical Activity:   . Days of Exercise per Week:   . Minutes of Exercise per Session:   Stress:   . Feeling of Stress :   Social Connections: Unknown  . Frequency of Communication with Friends and Family: Not on file  . Frequency of Social Gatherings with Friends and Family: Not on file  . Attends Religious Services: Not on file  . Active Member  of Clubs or Organizations: No  . Attends Archivist Meetings: Not on file  . Marital Status: Widowed  Intimate Partner Violence:   . Fear of Current or Ex-Partner:   . Emotionally Abused:   Marland Kitchen Physically Abused:   . Sexually Abused:    Family History  Problem Relation Age of Onset  . Heart disease Father   . Bladder Cancer Brother        in remission   . Hypertension Sister   . Dementia Sister   . Diabetes type II Sister   . Hypertension Sister   . Aneurysm Sister   . Hypertension Brother   . Arthritis Brother   . Hypertension Son   . Colon cancer Neg Hx       VITAL SIGNS BP 102/63   Pulse 87   Temp 98.1 F (36.7 C) (Oral)   Resp 20   Ht 5' 4"  (1.626 m)   Wt 152 lb 3.2 oz (69 kg)   BMI 26.13 kg/m   Outpatient Encounter Medications as of 08/16/2019  Medication Sig  . acetaminophen (TYLENOL) 650 MG CR tablet Take 650 mg by mouth every 6 (six) hours.  Marland Kitchen apixaban (ELIQUIS) 5 MG TABS tablet Take 1 tablet (5 mg total) by mouth 2 (two) times daily.  Marland Kitchen doxycycline (VIBRAMYCIN) 100 MG capsule Take 100 mg by mouth 2 (two) times daily.  . ergocalciferol (VITAMIN D2) 1.25 MG (50000 UT) capsule Take 1 capsule (50,000 Units total) by mouth once a week. One capsule once weekly  Started takinjg in January 2021, will take for 6 months only and re check)  . ezetimibe (ZETIA) 10 MG tablet TAKE 1 TABLET EVERY DAY  . folic acid (FOLVITE) 1 MG tablet Take 1 tablet (1 mg total) by mouth daily.  . furosemide (LASIX) 40 MG tablet Take 1 tablet (40 mg total) by mouth daily.  Marland Kitchen gabapentin (NEURONTIN) 100 MG capsule Take 1 capsule (100 mg total) by mouth at bedtime.  Marland Kitchen LINZESS 145 MCG CAPS capsule Take 145 mcg by mouth daily as needed.   . magnesium oxide (MAG-OX) 400 (241.3 Mg) MG tablet Take 1 tablet (400 mg total) by mouth daily.  . NON FORMULARY Fluid restriction of 1.8 liters per day (173m total) 7-3 = 800 ml 3-11 = 800 ml 11-7 = 175 ml Document totals qshift. Every  Shift Day, Evening, Nigh  . NON FORMULARY Diet: _____ Regular, __x____ NAS, _______Consistent Carbohydrate, _______NPO _____Other  . pantoprazole (PROTONIX) 40 MG tablet Take 1 tablet (40 mg total) by mouth daily.  . Potassium Chloride ER 20 MEQ TBCR Take 20 mEq by mouth every Tuesday, Thursday, Saturday, and Sunday. Take only while on Lasix  No facility-administered encounter medications on file as of 08/16/2019.     SIGNIFICANT DIAGNOSTIC EXAMS   PREVIOUS  08-04-19: chest x-ray: No active disease.   08-05-19: bilateral lower extremity venous doppler:  1. 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 2. No evidence of acute or chronic DVT within the right lower extremity.  NO NEW EXAMS     LABS REVIEWED PREVIOUS  08-04-19: wbc 6.6; hgb 8.9; hct 29.2; mcv 97.7 plt 228; glucose 125; bun 33; creat 0.58 k+ 3.2; na++ 142 ca 8.9 liver normal albumin 3.2 mag 1.9  08-07-19: wbc 6.7; hgb 7.8; hct 25.6; mcv 98.5 plt 226; glucose 121; bun 31; creat 0.73 ;k+ 4.2; na++ 142; ca 8.8   TODAY  08-10-19: wbc 5.4; hgb 7.4; hct 24.4; mcv 97.6 plt 271;  08-14-19 hgb 7.7; hct 26.0; glucose 92; bun 38; creat 0.82; k+ 4.0; na++ 142; ca 9.1    Review of Systems  Constitutional: Negative for malaise/fatigue.  Respiratory: Negative for cough and shortness of breath.   Cardiovascular: Positive for leg swelling. Negative for chest pain and palpitations.  Gastrointestinal: Negative for abdominal pain, constipation and heartburn.  Musculoskeletal: Negative for back pain, joint pain and myalgias.  Skin: Negative.   Neurological: Negative for dizziness.  Psychiatric/Behavioral: The patient is not nervous/anxious.       Physical  Exam Constitutional:      General: She is not in acute distress.    Appearance: She is well-developed. She is not diaphoretic.  Neck:     Thyroid: No thyromegaly.  Cardiovascular:     Rate and Rhythm: Normal rate and regular rhythm.     Pulses: Normal pulses.     Heart sounds: Normal heart sounds.  Pulmonary:     Effort: Pulmonary effort is normal. No respiratory distress.     Breath sounds: Normal breath sounds.  Abdominal:     General: Bowel sounds are normal. There is no distension.     Palpations: Abdomen is soft.     Tenderness: There is no abdominal tenderness.  Musculoskeletal:        General: Normal range of motion.     Cervical back: Neck supple.     Right lower leg: No edema.     Left lower leg: Edema present.     Comments: Edema is slightly better with less redness present   Lymphadenopathy:     Cervical: No cervical adenopathy.  Skin:    General: Skin is warm and dry.  Neurological:     Mental Status: She is alert. Mental status is at baseline.  Psychiatric:        Mood and Affect: Mood normal.     ASSESSMENT/ PLAN:  TODAY  1. Bilateral lower extremity edema: is stable will continue lasix 40 mg daily with k+ 10 meq 4 days per week 1800 cc fluid restriction   2. 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 Gastroesophageal reflux disease without esophagitis: is stable will continue protonix 40 mg daily  PREVIOUS  4. Left lower extremity cellulitis: is slowly improving will complete doxycycline and will monitor   5. NASH: is stable will monitor  6. Idiopathic peripheral neuropathy: is stable will continue gabapentin 100 mg nightly   7. Hemarthrosis of left knee: is stable is status post arthrocentesis   8. Slow transit constipation: is stable will continue linzess 145 mcg daily as needed  9. Hyperlipidemia LDL goal <100: is stable will continue zetia 10 mg daily   10.  Vitamin D deficiency: is stable will continue vit D 50,000 units weekly   11. Hypomagnesemia is stable will continue mag ox 400 mg daily    12. Chronic anemia: without change hgb 7.8 will monitor     MD is aware of resident's narcotic use and is in agreement with current plan of care. We will attempt to wean resident as appropriate.  Ok Edwards NP [facility]  Contact [phone] Monday through Friday 8am- 5pm  After hours call [phone]

## 2019-08-22 ENCOUNTER — Other Ambulatory Visit (HOSPITAL_COMMUNITY): Payer: Self-pay | Admitting: *Deleted

## 2019-08-22 ENCOUNTER — Other Ambulatory Visit (HOSPITAL_COMMUNITY)
Admission: RE | Admit: 2019-08-22 | Discharge: 2019-08-22 | Disposition: A | Discharge status: Home / Self Care | Payer: Medicare HMO | Source: Skilled Nursing Facility | Attending: Internal Medicine | Admitting: Internal Medicine

## 2019-08-22 ENCOUNTER — Non-Acute Institutional Stay (SKILLED_NURSING_FACILITY): Payer: Medicare HMO | Admitting: Adult Health

## 2019-08-22 ENCOUNTER — Encounter: Payer: Self-pay | Admitting: Adult Health

## 2019-08-22 DIAGNOSIS — K7581 Nonalcoholic steatohepatitis (NASH): Secondary | ICD-10-CM

## 2019-08-22 DIAGNOSIS — M25062 Hemarthrosis, left knee: Secondary | ICD-10-CM | POA: Diagnosis not present

## 2019-08-22 DIAGNOSIS — G609 Hereditary and idiopathic neuropathy, unspecified: Secondary | ICD-10-CM | POA: Diagnosis not present

## 2019-08-22 DIAGNOSIS — I82412 Acute embolism and thrombosis of left femoral vein: Secondary | ICD-10-CM

## 2019-08-22 DIAGNOSIS — N390 Urinary tract infection, site not specified: Secondary | ICD-10-CM | POA: Insufficient documentation

## 2019-08-22 DIAGNOSIS — C549 Malignant neoplasm of corpus uteri, unspecified: Secondary | ICD-10-CM

## 2019-08-22 LAB — URINALYSIS, ROUTINE W REFLEX MICROSCOPIC
Bilirubin Urine: NEGATIVE
Glucose, UA: NEGATIVE mg/dL
Hgb urine dipstick: NEGATIVE
Ketones, ur: NEGATIVE mg/dL
Nitrite: NEGATIVE
Protein, ur: NEGATIVE mg/dL
Specific Gravity, Urine: 1.018 (ref 1.005–1.030)
pH: 9 — ABNORMAL HIGH (ref 5.0–8.0)

## 2019-08-22 NOTE — Progress Notes (Signed)
Location:    [place] Room Number: 149/D Place of Service:  SNF (31)   CODE STATUS: Full Code  Allergies  Allergen Reactions   Cellcept [Mycophenolate Mofetil]     Muscle cramps, mouth soreness, difficulty swallowing, bruising   Statins     Dx of in   Ciprofloxacin Hcl Other (See Comments)    Chills, sick, could not tolerate it   Hydrocodone Nausea Only   Penicillins Rash    Chief Complaint  Patient presents with   Short Term Rehab         NASH  Idiopathic peripheral neuropathy: Hemarthrosis of left knee:   Weekly follow up for the first 30 days post hospitalization.     HPI:  She is a 86 year old short term rehab patient being seen for the management of her chronic illnesses: NASH; peripheral neuropathy; hemarthrosis. There are no reports of uncontrolled pain; no reports of changes in appetite; no anxiety.   Past Medical History:  Diagnosis Date   Arthritis    spinal stenosis   Elevated LFTs    secondary to fatty liver, negative work-up in 2011   Hypercholesterolemia    Hypertension    Lumbar spinal stenosis 01/14/2018   L3-4 and L4-5   Myopathy 02/15/2018   Osteoarthritis    right knee   Peripheral neuropathy 07/06/2018   PONV (postoperative nausea and vomiting)    Uterine cancer ([place]) 08/2006   grade 1, no recurrence up to 2013    Past Surgical History:  Procedure Laterality Date   ABDOMINAL HYSTERECTOMY  2008   adenocarcinoma stage 1   APPENDECTOMY  1973   BREAST BIOPSY Left 2018   Benign   CHOLECYSTECTOMY  1973   COLONOSCOPY    06/21/2007   PXT:[id number] rectum/Sigmoid diverticula, diminutive hepatic flexure polyp s/p bx. Benign.    COLONOSCOPY N/A 10/17/2012   Dr. Oneida Alar- moderate diverticulosis was noted in the sigmoid colon, moderate sized internal hemorrhoids. next tcs in10 years   ESOPHAGOGASTRODUODENOSCOPY N/A 03/20/2016   Dr. Oneida Alar, widely patent Schatzki ring, anemia due to ASA induced erosive  gastritis, mild duodenitis. gastric bx benign without H.pylori.    FOOT SURGERY  2007   Pins in toes on left foot, 5 to 6 yrs ago   [place] N/A 03/20/2016   Procedure: GIVENS CAPSULE STUDY;  Surgeon: Danie Binder, MD;  [facility];  Service: Endoscopy;  Laterality: N/A;   MUSCLE BIOPSY Left 06/07/2018   Procedure: LEFT THIGH MUSCLE BIOPSY;  Surgeon: Erroll Luna, MD;  Location: [place];  Service: General;  Laterality: Left;   TONSILLECTOMY      Social History   Socioeconomic History   Marital status: Married    Spouse name: Jeneen Rinks    Number of children: 3   Years of education: 12   Highest education level: 12th grade  Occupational History   Occupation: retired   Tobacco Use   Smoking status: Never Smoker   Smokeless tobacco: Never Used   Tobacco comment: Never smoked  Substance and Sexual Activity   Alcohol use: No   Drug use: No   Sexual activity: Not Currently    Birth control/protection: Post-menopausal, Surgical  Other Topics Concern   Not on file  Social History Narrative   Husband Jeneen Rinks recently passed away   Caffeine use: soda daily   Right handed    Social Determinants of Health   Financial Resource Strain:    Difficulty of Paying  Living Expenses:   Food Insecurity:    Worried About Charity fundraiser in the Last Year:    Arboriculturist in the Last Year:   Transportation Needs:    Film/video editor (Medical):    Lack of Transportation (Non-Medical):   Physical Activity:    Days of Exercise per Week:    Minutes of Exercise per Session:   Stress:    Feeling of Stress :   Social Connections: Unknown   Frequency of Communication with Friends and Family: Not on file   Frequency of Social Gatherings with Friends and Family: Not on file   Attends Religious Services: Not on file   Active Member of Clubs or Organizations: No   Attends Archivist Meetings: Not on file    Marital Status: Widowed  Human resources officer Violence:    Fear of Current or Ex-Partner:    Emotionally Abused:    Physically Abused:    Sexually Abused:    Family History  Problem Relation Age of Onset   Heart disease Father    Bladder Cancer Brother        in remission    Hypertension Sister    Dementia Sister    Diabetes type II Sister    Hypertension Sister    Aneurysm Sister    Hypertension Brother    Arthritis Brother    Hypertension Son    Colon cancer Neg Hx       VITAL SIGNS BP 140/69    Pulse 87    Temp 97.8 F (36.6 C) (Oral)    Resp 20    Ht 5' 4"  (1.626 m)    Wt 152 lb 3.2 oz (69 kg)    BMI 26.13 kg/m   Outpatient Encounter Medications as of 08/22/2019  Medication Sig   acetaminophen (TYLENOL) 650 MG CR tablet Take 650 mg by mouth every 6 (six) hours.   apixaban (ELIQUIS) 5 MG TABS tablet Take 1 tablet (5 mg total) by mouth 2 (two) times daily.   doxycycline (VIBRAMYCIN) 100 MG capsule Take 100 mg by mouth 2 (two) times daily.   ergocalciferol (VITAMIN D2) 1.25 MG (50000 UT) capsule Take 1 capsule (50,000 Units total) by mouth once a week. One capsule once weekly  Started takinjg in January 2021, will take for 6 months only and re check)   ezetimibe (ZETIA) 10 MG tablet TAKE 1 TABLET EVERY DAY   folic acid (FOLVITE) 1 MG tablet Take 1 tablet (1 mg total) by mouth daily.   furosemide (LASIX) 40 MG tablet Take 1 tablet (40 mg total) by mouth daily.   gabapentin (NEURONTIN) 100 MG capsule Take 1 capsule (100 mg total) by mouth at bedtime.   LINZESS 145 MCG CAPS capsule Take 145 mcg by mouth daily as needed.    magnesium oxide (MAG-OX) 400 (241.3 Mg) MG tablet Take 1 tablet (400 mg total) by mouth daily.   NON FORMULARY Fluid restriction of 1.8 liters per day (1763m total) 7-3 = 800 ml 3-11 = 800 ml 11-7 = 175 ml Document totals qshift. Every Shift Day, Evening, Nigh   NON FORMULARY Diet: _____ Regular, __x____ NAS, _______Consistent  Carbohydrate, _______NPO _____Other   pantoprazole (PROTONIX) 40 MG tablet Take 1 tablet (40 mg total) by mouth daily.   Potassium Chloride ER 20 MEQ TBCR Take 20 mEq by mouth every Tuesday, Thursday, Saturday, and Sunday. Take only while on Lasix   No facility-administered encounter medications on file as of 08/22/2019.  SIGNIFICANT DIAGNOSTIC EXAMS  PREVIOUS  08-04-19: chest x-ray: No active disease.   08-05-19: bilateral lower extremity venous doppler:  1. 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 2. No evidence of acute or chronic DVT within the right lower extremity.  NO NEW EXAMS     LABS REVIEWED PREVIOUS  08-04-19: wbc 6.6; hgb 8.9; hct 29.2; mcv 97.7 plt 228; glucose 125; bun 33; creat 0.58 k+ 3.2; na++ 142 ca 8.9 liver normal albumin 3.2 mag 1.9  08-07-19: wbc 6.7; hgb 7.8; hct 25.6; mcv 98.5 plt 226; glucose 121; bun 31; creat 0.73 ;k+ 4.2; na++ 142; ca 8.8  08-10-19: wbc 5.4; hgb 7.4; hct 24.4; mcv 97.6 plt 271;  08-14-19 hgb 7.7; hct 26.0; glucose 92; bun 38; creat 0.82; k+ 4.0; na++ 142; ca 9.1   NO NEW LABS.    Review of Systems  Constitutional: Negative for malaise/fatigue.  Respiratory: Negative for cough and shortness of breath.   Cardiovascular: Positive for leg swelling. Negative for chest pain and palpitations.  Gastrointestinal: Negative for abdominal pain, constipation and heartburn.  Musculoskeletal: Negative for back pain, joint pain and myalgias.  Skin: Negative.   Neurological: Negative for dizziness.  Psychiatric/Behavioral: The patient is not nervous/anxious.       Physical Exam Constitutional:      General: She is not in acute distress.    Appearance: She is  well-developed. She is not diaphoretic.  Neck:     Thyroid: No thyromegaly.  Cardiovascular:     Rate and Rhythm: Normal rate and regular rhythm.     Pulses: Normal pulses.     Heart sounds: Normal heart sounds.  Pulmonary:     Effort: Pulmonary effort is normal. No respiratory distress.     Breath sounds: Normal breath sounds.  Abdominal:     General: Bowel sounds are normal. There is no distension.     Palpations: Abdomen is soft.     Tenderness: There is no abdominal tenderness.  Musculoskeletal:        General: Normal range of motion.     Cervical back: Neck supple.     Right lower leg: No edema.     Left lower leg: Edema present.     Comments:  Edema is slightly better with less redness present    Lymphadenopathy:     Cervical: No cervical adenopathy.  Skin:    General: Skin is warm and dry.  Neurological:     Mental Status: She is alert. Mental status is at baseline.  Psychiatric:        Mood and Affect: Mood normal.      ASSESSMENT/ PLAN:  TODAY  1. NASH is stable will monitor   2. Idiopathic peripheral neuropathy: is stable will continue gabapentin 100 mg nightly   3. Hemarthrosis of left knee: no changes in status; is status post arthrocentesis   PREVIOUS  4. Slow transit constipation: is stable will continue linzess 145 mcg daily as needed  5. Hyperlipidemia LDL goal <100: is stable will continue zetia 10 mg daily   6. Vitamin D deficiency: is stable will continue vit D 50,000 units weekly   7. Hypomagnesemia is stable will continue mag ox 400 mg daily    8. Chronic anemia:  without change hgb 7.7 will monitor   9. Bilateral lower extremity edema: is stable will continue lasix 40 mg daily with k+ 10 meq 4 days per week 1800 cc fluid restriction   10. Chronic deep vein thrombosis of popliteal vein of left lower extremity: is stable is on chronic eliquis 5 mg twice daily   11. Gastroesophageal reflux disease without esophagitis: is stable will continue  protonix 40 mg daily      MD is aware of resident's narcotic use and is in agreement with current plan of care. We will attempt to wean resident as appropriate.  Ok Edwards NP [facility]  Contact [phone] Monday through Friday 8am- 5pm  After hours call [phone]

## 2019-08-23 ENCOUNTER — Telehealth: Payer: Self-pay | Admitting: Orthopedic Surgery

## 2019-08-23 ENCOUNTER — Ambulatory Visit (HOSPITAL_COMMUNITY)
Admission: RE | Admit: 2019-08-23 | Discharge: 2019-08-23 | Disposition: A | Discharge status: Home / Self Care | Payer: Medicare HMO | Source: Ambulatory Visit | Attending: Hematology | Admitting: Hematology

## 2019-08-23 ENCOUNTER — Inpatient Hospital Stay (HOSPITAL_COMMUNITY): Payer: Medicare HMO | Attending: Hematology

## 2019-08-23 DIAGNOSIS — R600 Localized edema: Secondary | ICD-10-CM | POA: Diagnosis not present

## 2019-08-23 DIAGNOSIS — I82412 Acute embolism and thrombosis of left femoral vein: Secondary | ICD-10-CM | POA: Diagnosis not present

## 2019-08-23 DIAGNOSIS — Z7901 Long term (current) use of anticoagulants: Secondary | ICD-10-CM | POA: Insufficient documentation

## 2019-08-23 DIAGNOSIS — Z8542 Personal history of malignant neoplasm of other parts of uterus: Secondary | ICD-10-CM | POA: Insufficient documentation

## 2019-08-23 DIAGNOSIS — C549 Malignant neoplasm of corpus uteri, unspecified: Secondary | ICD-10-CM

## 2019-08-23 DIAGNOSIS — Z90710 Acquired absence of both cervix and uterus: Secondary | ICD-10-CM | POA: Insufficient documentation

## 2019-08-23 DIAGNOSIS — Z86718 Personal history of other venous thrombosis and embolism: Secondary | ICD-10-CM | POA: Insufficient documentation

## 2019-08-23 LAB — COMPREHENSIVE METABOLIC PANEL
ALT: 34 U/L (ref 0–44)
AST: 45 U/L — ABNORMAL HIGH (ref 15–41)
Albumin: 3.2 g/dL — ABNORMAL LOW (ref 3.5–5.0)
Alkaline Phosphatase: 62 U/L (ref 38–126)
Anion gap: 11 (ref 5–15)
BUN: 40 mg/dL — ABNORMAL HIGH (ref 8–23)
CO2: 27 mmol/L (ref 22–32)
Calcium: 9.6 mg/dL (ref 8.9–10.3)
Chloride: 106 mmol/L (ref 98–111)
Creatinine, Ser: 0.93 mg/dL (ref 0.44–1.00)
GFR calc Af Amer: >60 mL/min (ref >60)
GFR calc non Af Amer: 56 mL/min — ABNORMAL LOW (ref >60)
Glucose, Bld: 132 mg/dL — ABNORMAL HIGH (ref 70–99)
Potassium: 3.3 mmol/L — ABNORMAL LOW (ref 3.5–5.1)
Sodium: 144 mmol/L (ref 135–145)
Total Bilirubin: 0.7 mg/dL (ref 0.3–1.2)
Total Protein: 6.6 g/dL (ref 6.5–8.1)

## 2019-08-23 LAB — IRON AND TIBC
Iron: 48 ug/dL (ref 28–170)
Saturation Ratios: 16 % (ref 10.4–31.8)
TIBC: 298 ug/dL (ref 250–450)
UIBC: 250 ug/dL

## 2019-08-23 LAB — CBC WITH DIFFERENTIAL/PLATELET
Abs Immature Granulocytes: 0.02 10*3/uL (ref 0.00–0.07)
Basophils Absolute: 0 10*3/uL (ref 0.0–0.1)
Basophils Relative: 0 %
Eosinophils Absolute: 0.1 10*3/uL (ref 0.0–0.5)
Eosinophils Relative: 1 %
HCT: 29.3 % — ABNORMAL LOW (ref 36.0–46.0)
Hemoglobin: 8.6 g/dL — ABNORMAL LOW (ref 12.0–15.0)
Immature Granulocytes: 0 %
Lymphocytes Relative: 16 %
Lymphs Abs: 1 10*3/uL (ref 0.7–4.0)
MCH: 29.1 pg (ref 26.0–34.0)
MCHC: 29.4 g/dL — ABNORMAL LOW (ref 30.0–36.0)
MCV: 99 fL (ref 80.0–100.0)
Monocytes Absolute: 0.4 10*3/uL (ref 0.1–1.0)
Monocytes Relative: 7 %
Neutro Abs: 5 10*3/uL (ref 1.7–7.7)
Neutrophils Relative %: 76 %
Platelets: 340 10*3/uL (ref 150–400)
RBC: 2.96 MIL/uL — ABNORMAL LOW (ref 3.87–5.11)
RDW: 14.4 % (ref 11.5–15.5)
WBC: 6.6 10*3/uL (ref 4.0–10.5)
nRBC: 0 % (ref 0.0–0.2)

## 2019-08-23 LAB — D-DIMER, QUANTITATIVE: D-Dimer, Quant: 3.06 ug/mL-FEU — ABNORMAL HIGH (ref 0.00–0.50)

## 2019-08-23 LAB — FERRITIN: Ferritin: 142 ng/mL (ref 11–307)

## 2019-08-23 LAB — VITAMIN B12: Vitamin B-12: 319 pg/mL (ref 180–914)

## 2019-08-23 LAB — FOLATE: Folate: 102 ng/mL (ref >5.9)

## 2019-08-23 NOTE — Telephone Encounter (Signed)
Leda Roys RN from [facility] returned Amy's call per Voice message; please call back at [phone]

## 2019-08-23 NOTE — Telephone Encounter (Signed)
I called, Suanne Marker will not be in until 11 I have left message for her to call me back.

## 2019-08-23 NOTE — Telephone Encounter (Signed)
Per voice message received, Leda Roys, RN at [facility] requests to speak with Dr Ruthe Mannan nurse/clinical staff regarding this patient.

## 2019-08-23 NOTE — Telephone Encounter (Signed)
[name] [name] did not know about visit, I pulled up note, and advised her he aspirated the knee, injected it, and she may have ongoing bleeding into the joint due to the blood thinner, she needs to follow up in 5-6 weeks  She has voiced understanding.

## 2019-08-24 ENCOUNTER — Inpatient Hospital Stay (HOSPITAL_BASED_OUTPATIENT_CLINIC_OR_DEPARTMENT_OTHER): Payer: Medicare HMO | Admitting: Hematology

## 2019-08-24 ENCOUNTER — Non-Acute Institutional Stay (SKILLED_NURSING_FACILITY): Payer: Medicare HMO | Admitting: Adult Health

## 2019-08-24 ENCOUNTER — Encounter: Payer: Self-pay | Admitting: Adult Health

## 2019-08-24 VITALS — BP 111/65 | HR 84 | Temp 96.8°F | Resp 18 | Wt 152.0 lb

## 2019-08-24 DIAGNOSIS — M25062 Hemarthrosis, left knee: Secondary | ICD-10-CM

## 2019-08-24 DIAGNOSIS — I82532 Chronic embolism and thrombosis of left popliteal vein: Secondary | ICD-10-CM

## 2019-08-24 DIAGNOSIS — N390 Urinary tract infection, site not specified: Secondary | ICD-10-CM

## 2019-08-24 LAB — URINE CULTURE: Culture: >=100000 — AB

## 2019-08-24 NOTE — Patient Instructions (Signed)
[place] at [facility] Discharge Instructions  You were seen today by Dr. Delton Coombes. He went over your recent results. Continue taking your blood thinner. You were counseled on possible options to address future blood clots, including a filter. You will be prescribed medications for pain. Dr. Delton Coombes will see you back in 2 weeks for labs and follow up.   Thank you for choosing [place] at [facility] to provide your oncology and hematology care.  To afford each patient quality time with our provider, please arrive at least 15 minutes before your scheduled appointment time.   If you have a lab appointment with the [facility] please come in thru the  Main Entrance and check in at the main information desk  You need to re-schedule your appointment should you arrive 10 or more minutes late.  We strive to give you quality time with our providers, and arriving late affects you and other patients whose appointments are after yours.  Also, if you no show three or more times for appointments you may be dismissed from the clinic at the providers discretion.     Again, thank you for choosing [facility].  Our hope is that these requests will decrease the amount of time that you wait before being seen by our physicians.       _____________________________________________________________  Should you have questions after your visit to [facility], please contact our office at [phone] between the hours of 8:00 a.m. and 4:30 p.m.  Voicemails left after 4:00 p.m. will not be returned until the following business day.  For prescription refill requests, have your pharmacy contact our office and allow 72 hours.    Cancer Center Support Programs:   > Cancer Support Group  2nd Tuesday of the month 1pm-2pm, Journey Room

## 2019-08-24 NOTE — Progress Notes (Signed)
Location:    [facility] Room Number: 149/D Place of Service:  SNF (31)   CODE STATUS: Full Code  Allergies  Allergen Reactions  . Cellcept [Mycophenolate Mofetil]     Muscle cramps, mouth soreness, difficulty swallowing, bruising  . Statins     Dx of in  . Ciprofloxacin Hcl Other (See Comments)    Chills, sick, could not tolerate it  . Hydrocodone Nausea Only  . Penicillins Rash    Chief Complaint  Patient presents with  . Acute Visit    Care Plan Meeting    HPI:  We have come together for her care plan meeting. Unable for BIMS; mood 3/30. Her urine culture has come back demonstrating an UTI. Her weight is stable. There are no reports of falls. She requires extensive assist with her adls. She is frequently incontinent of bladder and bowel. She is on fluid restriction. There are no reports of uncontrolled pain.   Past Medical History:  Diagnosis Date  . Arthritis    spinal stenosis  . Elevated LFTs    secondary to fatty liver, negative work-up in 2011  . Hypercholesterolemia   . Hypertension   . Lumbar spinal stenosis 01/14/2018   L3-4 and L4-5  . Myopathy 02/15/2018  . Osteoarthritis    right knee  . Peripheral neuropathy 07/06/2018  . PONV (postoperative nausea and vomiting)   . Uterine cancer ([place]) 08/2006   grade 1, no recurrence up to 2013    Past Surgical History:  Procedure Laterality Date  . 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 APPENDECTOMY  1973  . BREAST BIOPSY Left 2018   Benign  . CHOLECYSTECTOMY  1973  . COLONOSCOPY    06/21/2007   MCN:OBSJGG rectum/Sigmoid diverticula, diminutive hepatic flexure polyp s/p bx. Benign.   . COLONOSCOPY N/A 10/17/2012   Dr. Oneida Alar- moderate diverticulosis was noted in the sigmoid colon, moderate sized internal hemorrhoids. next tcs in10 years  . ESOPHAGOGASTRODUODENOSCOPY N/A 03/20/2016   Dr. Oneida Alar, widely patent Schatzki ring, anemia due to ASA induced erosive gastritis,  mild duodenitis. gastric bx benign without H.pylori.   Marland Kitchen FOOT SURGERY  2007   Pins in toes on left foot, 5 to 6 yrs ago  . GIVENS CAPSULE STUDY N/A 03/20/2016   Procedure: GIVENS CAPSULE STUDY;  Surgeon: Danie Binder, MD;  Location: [facility];  Service: Endoscopy;  Laterality: N/A;  . MUSCLE BIOPSY Left 06/07/2018   Procedure: LEFT THIGH MUSCLE BIOPSY;  Surgeon: Erroll Luna, MD;  Location: [facility];  Service: General;  Laterality: Left;  . TONSILLECTOMY      Social History   Socioeconomic History  . Marital status: Married    Spouse name: Jeneen Rinks   . Number of children: 3  . Years of education: 67  . Highest education level: 12th grade  Occupational History  . Occupation: retired   Tobacco Use  . Smoking status: Never Smoker  . Smokeless tobacco: Never Used  . Tobacco comment: Never smoked  Substance and Sexual Activity  . Alcohol use: No  . Drug use: No  . Sexual activity: Not Currently    Birth control/protection: Post-menopausal, Surgical  Other Topics Concern  . Not on file  Social History Narrative   Husband Jeneen Rinks recently passed away   Caffeine use: soda daily   Right handed    Social Determinants of Health   Financial Resource Strain:   . Difficulty of Paying Living Expenses:  Food Insecurity:   . Worried About Charity fundraiser in the Last Year:   . Arboriculturist in the Last Year:   Transportation Needs:   . Film/video editor (Medical):   Marland Kitchen Lack of Transportation (Non-Medical):   Physical Activity:   . Days of Exercise per Week:   . Minutes of Exercise per Session:   Stress:   . Feeling of Stress :   Social Connections: Unknown  . Frequency of Communication with Friends and Family: Not on file  . Frequency of Social Gatherings with Friends and Family: Not on file  . Attends Religious Services: Not on file  . Active Member of Clubs or Organizations: No  . Attends Archivist Meetings: Not on file  . Marital  Status: Widowed  Intimate Partner Violence:   . Fear of Current or Ex-Partner:   . Emotionally Abused:   Marland Kitchen Physically Abused:   . Sexually Abused:    Family History  Problem Relation Age of Onset  . Heart disease Father   . Bladder Cancer Brother        in remission   . Hypertension Sister   . Dementia Sister   . Diabetes type II Sister   . Hypertension Sister   . Aneurysm Sister   . Hypertension Brother   . Arthritis Brother   . Hypertension Son   . Colon cancer Neg Hx       VITAL SIGNS BP 140/65   Pulse 62   Temp (!) 97.3 F (36.3 C) (Oral)   Resp 20   Ht 5' 4"  (1.626 m)   Wt 152 lb 12.8 oz (69.3 kg)   BMI 26.23 kg/m   Outpatient Encounter Medications as of 08/24/2019  Medication Sig  . acetaminophen (TYLENOL) 650 MG CR tablet Take 650 mg by mouth every 6 (six) hours.  Marland Kitchen apixaban (ELIQUIS) 5 MG TABS tablet Take 1 tablet (5 mg total) by mouth 2 (two) times daily.  . cefTRIAXone (ROCEPHIN) 1 g injection Inject 1 g into the muscle daily. For  UTI proteus mirabilis  . ergocalciferol (VITAMIN D2) 1.25 MG (50000 UT) capsule Take 1 capsule (50,000 Units total) by mouth once a week. One capsule once weekly  Started takinjg in January 2021, will take for 6 months only and re check)  . ezetimibe (ZETIA) 10 MG tablet TAKE 1 TABLET EVERY DAY  . folic acid (FOLVITE) 1 MG tablet Take 1 tablet (1 mg total) by mouth daily.  . furosemide (LASIX) 40 MG tablet Take 1 tablet (40 mg total) by mouth daily.  Marland Kitchen gabapentin (NEURONTIN) 100 MG capsule Take 1 capsule (100 mg total) by mouth at bedtime.  Marland Kitchen LINZESS 145 MCG CAPS capsule Take 145 mcg by mouth daily as needed.   . magnesium oxide (MAG-OX) 400 (241.3 Mg) MG tablet Take 1 tablet (400 mg total) by mouth daily.  . NON FORMULARY Fluid restriction of 1.8 liters per day (1781m total) 7-3 = 800 ml 3-11 = 800 ml 11-7 = 175 ml Document totals qshift. Every Shift Day, Evening, Nigh  . NON FORMULARY Diet: _____ Regular, __x____  NAS, _______Consistent Carbohydrate, _______NPO _____Other  . pantoprazole (PROTONIX) 40 MG tablet Take 1 tablet (40 mg total) by mouth daily.  . Potassium Chloride ER 20 MEQ TBCR Take 20 mEq by mouth every Tuesday, Thursday, Saturday, and Sunday. Take only while on Lasix   No facility-administered encounter medications on file as of 08/24/2019.     SIGNIFICANT DIAGNOSTIC  EXAMS   PREVIOUS  08-04-19: chest x-ray: No active disease.   08-05-19: bilateral lower extremity venous doppler:  1. 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 2. No evidence of acute or chronic DVT within the right lower extremity.  NO NEW EXAMS     LABS REVIEWED PREVIOUS  08-04-19: wbc 6.6; hgb 8.9; hct 29.2; mcv 97.7 plt 228; glucose 125; bun 33; creat 0.58 k+ 3.2; na++ 142 ca 8.9 liver normal albumin 3.2 mag 1.9  08-07-19: wbc 6.7; hgb 7.8; hct 25.6; mcv 98.5 plt 226; glucose 121; bun 31; creat 0.73 ;k+ 4.2; na++ 142; ca 8.8  08-10-19: wbc 5.4; hgb 7.4; hct 24.4; mcv 97.6 plt 271;  08-14-19 hgb 7.7; hct 26.0; glucose 92; bun 38; creat 0.82; k+ 4.0; na++ 142; ca 9.1   TODAY  08-22-19: urine culture: proteus mirabilis: rocephin 08-23-19: wbc 6.6; hgb 8.6; hct 29.3; mcv 99.0 plt 340; glucose 132; bun 40; creat 0.93; k+ 3.3; na++ 144; ca 9.6 liver normal albumin 3.2 vit B 12: 319; foalte 102; ferritin 142; iron 48; tibc 298     Review of Systems  Constitutional: Negative for malaise/fatigue.  Respiratory: Negative for cough and shortness of breath.   Cardiovascular: Positive for leg swelling. Negative for chest pain and palpitations.  Gastrointestinal: Negative for abdominal pain, constipation and heartburn.  Musculoskeletal: Negative for  back pain, joint pain and myalgias.  Skin: Negative.   Neurological: Negative for dizziness.  Psychiatric/Behavioral: The patient is not nervous/anxious.      Physical Exam Constitutional:      General: She is not in acute distress.    Appearance: She is well-developed. She is not diaphoretic.  Neck:     Thyroid: No thyromegaly.  Cardiovascular:     Rate and Rhythm: Normal rate and regular rhythm.     Pulses: Normal pulses.     Heart sounds: Normal heart sounds.  Pulmonary:     Effort: Pulmonary effort is normal. No respiratory distress.     Breath sounds: Normal breath sounds.  Abdominal:     General: Bowel sounds are normal. There is no distension.     Palpations: Abdomen is soft.     Tenderness: There is no abdominal tenderness.  Musculoskeletal:        General: Normal range of motion.     Cervical back: Neck supple.     Right lower leg: Edema present.     Left lower leg: Edema present.  Lymphadenopathy:     Cervical: No cervical adenopathy.  Skin:    General: Skin is warm and dry.  Neurological:     Mental Status: She is alert. Mental status is at baseline.  Psychiatric:        Mood and Affect: Mood normal.     ASSESSMENT/ PLAN:  TODAY  1. Acute uti 2. 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 Hemarthrosis of left knee  Will begin rocephin 1 mg daily through 08-28-19 Will continue therapy as directed Will continue current plan of care Will continue to monitor her status Her goal is to return back home.      MD is aware of resident's narcotic use and is in agreement with current plan of care. We will attempt  to wean resident as appropriate.  Ok Edwards NP [facility]  Contact [phone] Monday through Friday 8am- 5pm  After hours call [phone]

## 2019-08-24 NOTE — Progress Notes (Signed)
[name] [name], [place] [address]   CLINIC:  Medical Oncology/Hematology  PCP:  Fayrene Helper, MD [address] / [place] [address]  [phone]  REASON FOR VISIT:  Follow-up for DVT of L femoral vein  CURRENT THERAPY: Eliquis  INTERVAL HISTORY:  [name] [name], a 86 y.o. female, returns for routine follow-up for her DVT of L femoral vein. [name] [name] was last contacted via telephone on 06/13/2019.  She went to the ED on 08/04/2019 for bilat leg edema and weakness and inability to get around. She continues taking the Eliquis and Lasix 20 mg. She reports having pain in her L knee w/ movement. She denies having any abnormal bleeding. She is followed by vascular surgery, Dr. Trula Slade.  She had left knee aspirated by Dr. Aline Brochure which was bloody.   REVIEW OF SYSTEMS:  Review of Systems  Constitutional: Positive for appetite change (moderately decreased) and fatigue (moderate).  Cardiovascular: Positive for leg swelling (bilat).  Gastrointestinal: Positive for constipation.  Musculoskeletal: Positive for arthralgias (L knee pain) and myalgias (L foot).  Neurological: Positive for numbness.  Hematological: Does not bruise/bleed easily.    PAST MEDICAL/SURGICAL HISTORY:  Past Medical History:  Diagnosis Date  . Arthritis    spinal stenosis  . Elevated LFTs    secondary to fatty liver, negative work-up in 2011  . Hypercholesterolemia   . Hypertension   . Lumbar spinal stenosis 01/14/2018   L3-4 and L4-5  . Myopathy 02/15/2018  . Osteoarthritis    right knee  . Peripheral neuropathy 07/06/2018  . PONV (postoperative nausea and vomiting)   . Uterine cancer ([place]) 08/2006   grade 1, no recurrence up to 2013   Past Surgical History:  Procedure Laterality Date  . 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 APPENDECTOMY  1973  . BREAST BIOPSY Left 2018   Benign  . CHOLECYSTECTOMY  1973  . COLONOSCOPY     06/21/2007   IOM:BTDHRC rectum/Sigmoid diverticula, diminutive hepatic flexure polyp s/p bx. Benign.   . COLONOSCOPY N/A 10/17/2012   Dr. Oneida Alar- moderate diverticulosis was noted in the sigmoid colon, moderate sized internal hemorrhoids. next tcs in10 years  . ESOPHAGOGASTRODUODENOSCOPY N/A 03/20/2016   Dr. Oneida Alar, widely patent Schatzki ring, anemia due to ASA induced erosive gastritis, mild duodenitis. gastric bx benign without H.pylori.   Marland Kitchen FOOT SURGERY  2007   Pins in toes on left foot, 5 to 6 yrs ago  . GIVENS CAPSULE STUDY N/A 03/20/2016   Procedure: GIVENS CAPSULE STUDY;  Surgeon: Danie Binder, MD;  Location: [facility];  Service: Endoscopy;  Laterality: N/A;  . MUSCLE BIOPSY Left 06/07/2018   Procedure: LEFT THIGH MUSCLE BIOPSY;  Surgeon: Erroll Luna, MD;  Location: [facility];  Service: General;  Laterality: Left;  . TONSILLECTOMY      SOCIAL HISTORY:  Social History   Socioeconomic History  . Marital status: Married    Spouse name: Jeneen Rinks   . Number of children: 3  . Years of education: 73  . Highest education level: 12th grade  Occupational History  . Occupation: retired   Tobacco Use  . Smoking status: Never Smoker  . Smokeless tobacco: Never Used  . Tobacco comment: Never smoked  Substance and Sexual Activity  . Alcohol use: No  . Drug use: No  . Sexual activity: Not Currently    Birth control/protection: Post-menopausal, Surgical  Other Topics Concern  .  Not on file  Social History Narrative   Husband Jeneen Rinks recently passed away   Caffeine use: soda daily   Right handed    Social Determinants of Health   Financial Resource Strain:   . Difficulty of Paying Living Expenses:   Food Insecurity:   . Worried About Charity fundraiser in the Last Year:   . Arboriculturist in the Last Year:   Transportation Needs:   . Film/video editor (Medical):   Marland Kitchen Lack of Transportation (Non-Medical):   Physical Activity:   . Days of Exercise per  Week:   . Minutes of Exercise per Session:   Stress:   . Feeling of Stress :   Social Connections: Unknown  . Frequency of Communication with Friends and Family: Not on file  . Frequency of Social Gatherings with Friends and Family: Not on file  . Attends Religious Services: Not on file  . Active Member of Clubs or Organizations: No  . Attends Archivist Meetings: Not on file  . Marital Status: Widowed  Intimate Partner Violence:   . Fear of Current or Ex-Partner:   . Emotionally Abused:   Marland Kitchen Physically Abused:   . Sexually Abused:     FAMILY HISTORY:  Family History  Problem Relation Age of Onset  . Heart disease Father   . Bladder Cancer Brother        in remission   . Hypertension Sister   . Dementia Sister   . Diabetes type II Sister   . Hypertension Sister   . Aneurysm Sister   . Hypertension Brother   . Arthritis Brother   . Hypertension Son   . Colon cancer Neg Hx     CURRENT MEDICATIONS:  No current outpatient medications on file.   No current facility-administered medications for this visit.    ALLERGIES:  Allergies  Allergen Reactions  . Cellcept [Mycophenolate Mofetil]     Muscle cramps, mouth soreness, difficulty swallowing, bruising  . Statins     Dx of in  . Ciprofloxacin Hcl Other (See Comments)    Chills, sick, could not tolerate it  . Hydrocodone Nausea Only  . Penicillins Rash    PHYSICAL EXAM:  Performance status (ECOG): 2 - Symptomatic, <50% confined to bed  Vitals:   08/24/19 1516  BP: 111/65  Pulse: 84  Resp: 18  Temp: (!) 96.8 F (36 C)  SpO2: 100%   Wt Readings from Last 3 Encounters:  08/24/19 152 lb (68.9 kg)  08/24/19 152 lb 12.8 oz (69.3 kg)  08/22/19 152 lb 3.2 oz (69 kg)   Physical Exam Vitals reviewed.  Constitutional:      Appearance: Normal appearance.  Cardiovascular:     Rate and Rhythm: Normal rate and regular rhythm.  Pulmonary:     Effort: Pulmonary effort is normal.     Breath sounds: Normal  breath sounds.  Musculoskeletal:        General: Tenderness (L knee) present.     Right lower leg: Edema present.     Left lower leg: Edema present.  Neurological:     General: No focal deficit present.     Mental Status: She is alert and oriented to person, place, and time.  Psychiatric:        Mood and Affect: Mood normal.        Behavior: Behavior normal.     LABORATORY DATA:  I have reviewed the labs as listed.  CBC Latest Ref Rng &  Units 08/23/2019 08/14/2019 08/10/2019  WBC 4.0 - 10.5 K/uL 6.6 - 5.4  Hemoglobin 12.0 - 15.0 g/dL 8.6(L) 7.7(L) 7.4(L)  Hematocrit 36.0 - 46.0 % 29.3(L) 26.0(L) 24.4(L)  Platelets 150 - 400 K/uL 340 - 271   CMP Latest Ref Rng & Units 08/23/2019 08/14/2019 08/07/2019  Glucose 70 - 99 mg/dL 132(H) 92 121(H)  BUN 8 - 23 mg/dL 40(H) 38(H) 31(H)  Creatinine 0.44 - 1.00 mg/dL 0.93 0.82 0.73  Sodium 135 - 145 mmol/L 144 142 142  Potassium 3.5 - 5.1 mmol/L 3.3(L) 4.0 4.2  Chloride 98 - 111 mmol/L 106 104 104  CO2 22 - 32 mmol/L 27 29 29   Calcium 8.9 - 10.3 mg/dL 9.6 9.1 8.8(L)  Total Protein 6.5 - 8.1 g/dL 6.6 - -  Total Bilirubin 0.3 - 1.2 mg/dL 0.7 - -  Alkaline Phos 38 - 126 U/L 62 - -  AST 15 - 41 U/L 45(H) - -  ALT 0 - 44 U/L 34 - -      Component Value Date/Time   RBC 2.96 (L) 08/23/2019 0940   MCV 99.0 08/23/2019 0940   MCV 88 10/24/2018 1520   MCH 29.1 08/23/2019 0940   MCHC 29.4 (L) 08/23/2019 0940   RDW 14.4 08/23/2019 0940   RDW 13.2 10/24/2018 1520   LYMPHSABS 1.0 08/23/2019 0940   LYMPHSABS 0.7 10/24/2018 1520   MONOABS 0.4 08/23/2019 0940   EOSABS 0.1 08/23/2019 0940   EOSABS 0.1 10/24/2018 1520   BASOSABS 0.0 08/23/2019 0940   BASOSABS 0.0 10/24/2018 1520    DIAGNOSTIC IMAGING:  I have independently reviewed the scans and discussed with the patient. US Venous Img Lower Bilateral  Result Date: 08/23/2019 CLINICAL DATA:  History of previous DVT now with lower extremity edema. Patient remains on anticoagulation. Evaluate for  acute or chronic DVT. EXAM: BILATERAL LOWER EXTREMITY VENOUS DOPPLER ULTRASOUND TECHNIQUE: Gray-scale sonography with graded compression, as well as color Doppler and duplex ultrasound were performed to evaluate the lower extremity deep venous systems from the level of the common femoral vein and including the common femoral, femoral, profunda femoral, popliteal and calf veins including the posterior tibial, peroneal and gastrocnemius veins when visible. The superficial great saphenous vein was also interrogated. Spectral Doppler was utilized to evaluate flow at rest and with distal augmentation maneuvers in the common femoral, femoral and popliteal veins. COMPARISON:  Bilateral lower extremity venous Doppler ultrasound-08/05/2019 (occlusive DVT involving the left popliteal vein extending to involve the left posterior tibial and one of the paired peroneal veins). FINDINGS: RIGHT LOWER EXTREMITY Common Femoral Vein: No evidence of thrombus. Normal compressibility, respiratory phasicity and response to augmentation. Saphenofemoral Junction: No evidence of thrombus. Normal compressibility and flow on color Doppler imaging. Profunda Femoral Vein: No evidence of thrombus. Normal compressibility and flow on color Doppler imaging. Femoral Vein: No evidence of thrombus. Normal compressibility, respiratory phasicity and response to augmentation. Popliteal Vein: No evidence of thrombus. Normal compressibility, respiratory phasicity and response to augmentation. Calf Veins: No evidence of thrombus. Normal compressibility and flow on color Doppler imaging. Superficial Great Saphenous Vein: No evidence of thrombus. Normal compressibility. Venous Reflux:  None. Other Findings:  None. LEFT LOWER EXTREMITY Common Femoral Vein: No evidence of thrombus. Normal compressibility, respiratory phasicity and response to augmentation. Saphenofemoral Junction: No evidence of thrombus. Normal compressibility and flow on color Doppler imaging.  Profunda Femoral Vein: No evidence of thrombus. Normal compressibility and flow on color Doppler imaging. Femoral Vein: No evidence of thrombus. Normal compressibility, respiratory phasicity  and response to augmentation. Popliteal Vein: No evidence of acute or chronic thrombus. Normal compressibility, respiratory phasicity and response to augmentation. Calf Veins: Appear patent where imaged. Superficial Great Saphenous Vein: No evidence of thrombus. Normal compressibility. Venous Reflux:  None. Other Findings:  None. IMPRESSION: 1. No evidence of acute or chronic DVT within either lower extremity with special attention paid to the left popliteal and tibial veins. 2. Note made of an approximate 16.6 x 3.9 x 3.5 cm complex fluid collection extending from the left popliteal fossa to the proximal/mid aspect the left calf, not seen on the 08/05/2019 examination - differential considerations include a hematoma (favored), leaking Baker's cyst or (less likely) abscess or seroma. Electronically Signed   By: Sandi Mariscal M.D.   On: 08/23/2019 13:38   US Venous Img Lower Bilateral (DVT)  Result Date: 08/05/2019 CLINICAL DATA:  Bilateral lower extremity pain and edema. History of previous DVT. Evaluate for acute or chronic DVT. EXAM: BILATERAL LOWER EXTREMITY VENOUS DOPPLER ULTRASOUND TECHNIQUE: Gray-scale sonography with graded compression, as well as color Doppler and duplex ultrasound were performed to evaluate the lower extremity deep venous systems from the level of the common femoral vein and including the common femoral, femoral, profunda femoral, popliteal and calf veins including the posterior tibial, peroneal and gastrocnemius veins when visible. The superficial great saphenous vein was also interrogated. Spectral Doppler was utilized to evaluate flow at rest and with distal augmentation maneuvers in the common femoral, femoral and popliteal veins. COMPARISON:  Bilateral lower extremity venous Doppler  ultrasound-02/05/2019 (positive for occlusive DVT involving the left femoral and popliteal veins) FINDINGS: RIGHT LOWER EXTREMITY Common Femoral Vein: No evidence of thrombus. Normal compressibility, respiratory phasicity and response to augmentation. Saphenofemoral Junction: No evidence of thrombus. Normal compressibility and flow on color Doppler imaging. Profunda Femoral Vein: No evidence of thrombus. Normal compressibility and flow on color Doppler imaging. Femoral Vein: No evidence of thrombus. Normal compressibility, respiratory phasicity and response to augmentation. Popliteal Vein: No evidence of thrombus. Normal compressibility, respiratory phasicity and response to augmentation. Calf Veins: No evidence of thrombus. Normal compressibility and flow on color Doppler imaging. Superficial Great Saphenous Vein: No evidence of thrombus. Normal compressibility. Other Findings:  None. LEFT LOWER EXTREMITY Common Femoral Vein: No evidence of thrombus. Normal compressibility, respiratory phasicity and response to augmentation. Saphenofemoral Junction: No evidence of thrombus. Normal compressibility and flow on color Doppler imaging. Profunda Femoral Vein: No evidence of thrombus. Normal compressibility and flow on color Doppler imaging. Femoral Vein: No evidence of acute or chronic thrombus. Normal compressibility, respiratory phasicity and response to augmentation. Popliteal Vein: There is hypoechoic occlusive thrombus involving the left popliteal vein (images 55 and 56) Calf Veins: There is hypoechoic thrombus involving the left posterior tibial (image 59) and one of the paired left peroneal veins (image 61). The adjacent peroneal vein appears patent (image 62), as does the anterior tibial vein (image 67). Superficial Great Saphenous Vein: No evidence of thrombus. Normal compressibility. Other Findings:  None. IMPRESSION: 1. Occlusive DVT involving the left popliteal vein with extension to involve the left  posterior tibial vein and one of the paired left peroneal veins, similar to the 01/2019 examination and thus presumably chronic in etiology, though in the absence of interval prior examinations, an acute on chronic process is not excluded. 2. No evidence of acute or chronic DVT within the right lower extremity. Electronically Signed   By: Sandi Mariscal M.D.   On: 08/05/2019 09:31   DG Chest [facility]  Result Date: 08/04/2019 CLINICAL DATA:  Edema EXAM: PORTABLE CHEST 1 VIEW COMPARISON:  None. FINDINGS: The heart size and mediastinal contours are within normal limits. Aortic knob calcifications are present. The lungs are clear. No large airspace consolidation or pleural effusion. The visualized skeletal structures are unremarkable. IMPRESSION: No active disease. Electronically Signed   By: Prudencio Pair M.D.   On: 08/04/2019 20:16     ASSESSMENT: 1.  Weakly provoked left leg DVT: -Status post fall prior to presentation to the ER on 02/05/2019 with left leg swelling.  Doppler showed occlusive DVT extending from proximal left femoral vein through left popliteal vein. -He was started on Eliquis since then.  Eliquis was discontinued around 06/06/2019.  D-dimer was 1.42 on same day. -I have recommended restarting Eliquis on 06/13/2019.  She has been taking since then. -Ultrasound of the leg on 08/05/2019 showed occlusive DVT involving left popliteal vein with extension to left posterior tibial vein and one of the paired left peroneal veins similar to 01/2019 examination. -Ultrasound on 08/23/2019 showed no evidence of acute or chronic DVT within the either lower extremity with special attention paid to the left popliteal and tibial veins.  There is a 16.6 x 3.9 x 3.5 cm complex fluid collection extending from the left popliteal fossa to the proximal/mid aspect of the left calf, not seen on 08/05/2019.  2.  Endometrial adenocarcinoma: -She had T1b N0 well-differentiated endometrioid adenocarcinoma, FIGO grade 1,  TAH/BSO with lymphadenectomy done on 12/14/2006.  She did not require any adjuvant therapy. -CT scan on 03/06/2019 did not show any evidence of recurrence or metastasis.    PLAN:  1.  Weakly provoked left leg DVT: -Patient currently on Eliquis.  She has reportedly developed hemarthrosis and is unable to walk because of pain. -D-dimer on 08/23/2019 was elevated at 3.06. -Even though her Doppler on 08/23/2019 was negative for DVT, Doppler on 08/05/2019 was positive.  With elevated D-dimer, and immobility, she is at high risk for recurrence of DVT should we stop Eliquis. -I will reach out to Dr. Trula Slade to see if she is a candidate for retractable IVC filter.  Then we can temporarily discontinue Eliquis until her hemarthrosis improves and she is back on her feet. -Other option is discontinuation of Eliquis and serial Doppler monitoring on a weekly basis.  2.  Left knee pain: -I will start her on tramadol 50 mg every 6 hours as she is having excruciating pain which is not controlled with Tylenol or NSAIDs.  3.  Endometrial adenocarcinoma: -CT scan on 03/06/2019 did not show any evidence of recurrence or metastatic disease.   Orders placed this encounter:  No orders of the defined types were placed in this encounter.  Total time spent is 30 minutes with more than 50% of the time spent face-to-face discussing scan results, treatment plan, counseling and coordination of care.  Derek Jack, MD [facility] [phone]   I, Jacqualyn Posey, am acting as a scribe for Dr. Sanda Linger.  I, Derek Jack MD, have reviewed the above documentation for accuracy and completeness, and I agree with the above.

## 2019-08-28 ENCOUNTER — Ambulatory Visit: Payer: Medicare HMO | Admitting: Neurology

## 2019-08-28 ENCOUNTER — Telehealth: Payer: Self-pay | Admitting: Neurology

## 2019-08-28 ENCOUNTER — Encounter: Payer: Self-pay | Admitting: Adult Health

## 2019-08-28 ENCOUNTER — Other Ambulatory Visit: Payer: Self-pay | Admitting: Adult Health

## 2019-08-28 ENCOUNTER — Non-Acute Institutional Stay (SKILLED_NURSING_FACILITY): Payer: Medicare HMO | Admitting: Adult Health

## 2019-08-28 DIAGNOSIS — I82532 Chronic embolism and thrombosis of left popliteal vein: Secondary | ICD-10-CM

## 2019-08-28 DIAGNOSIS — K7581 Nonalcoholic steatohepatitis (NASH): Secondary | ICD-10-CM

## 2019-08-28 DIAGNOSIS — N390 Urinary tract infection, site not specified: Secondary | ICD-10-CM | POA: Insufficient documentation

## 2019-08-28 DIAGNOSIS — M25062 Hemarthrosis, left knee: Secondary | ICD-10-CM | POA: Diagnosis not present

## 2019-08-28 DIAGNOSIS — I82412 Acute embolism and thrombosis of left femoral vein: Secondary | ICD-10-CM

## 2019-08-28 MED ORDER — PANTOPRAZOLE SODIUM 40 MG PO TBEC: 40.0000 mg | DELAYED_RELEASE_TABLET | Freq: Every day | ORAL | 0 refills | Status: DC

## 2019-08-28 MED ORDER — EZETIMIBE 10 MG PO TABS: 10.0000 mg | ORAL_TABLET | Freq: Every day | ORAL | 0 refills | Status: DC

## 2019-08-28 MED ORDER — MAGNESIUM OXIDE 400 (241.3 MG) MG PO TABS: 400.0000 mg | ORAL_TABLET | Freq: Every day | ORAL | 0 refills | Status: DC

## 2019-08-28 MED ORDER — POTASSIUM CHLORIDE ER 20 MEQ PO TBCR: 20.0000 meq | EXTENDED_RELEASE_TABLET | ORAL | 0 refills | Status: DC

## 2019-08-28 MED ORDER — ERGOCALCIFEROL 1.25 MG (50000 UT) PO CAPS: 50000.0000 [IU] | ORAL_CAPSULE | ORAL | 0 refills | Status: DC

## 2019-08-28 MED ORDER — APIXABAN 5 MG PO TABS: 5.0000 mg | ORAL_TABLET | Freq: Two times a day (BID) | ORAL | 0 refills | Status: DC

## 2019-08-28 MED ORDER — FOLIC ACID 1 MG PO TABS: 1.0000 mg | ORAL_TABLET | Freq: Every day | ORAL | 0 refills | Status: DC

## 2019-08-28 MED ORDER — LINZESS 145 MCG PO CAPS: 145.0000 ug | ORAL_CAPSULE | Freq: Every day | ORAL | 0 refills | Status: DC | PRN

## 2019-08-28 MED ORDER — FUROSEMIDE 40 MG PO TABS: 40.0000 mg | ORAL_TABLET | Freq: Every day | ORAL | 0 refills | Status: DC

## 2019-08-28 MED ORDER — GABAPENTIN 100 MG PO CAPS: 100.0000 mg | ORAL_CAPSULE | Freq: Every day | ORAL | 0 refills | Status: DC

## 2019-08-28 NOTE — Telephone Encounter (Signed)
Pt called Friday morning and LVM stating she is in a rest home now and will not be needing her appt at this time.

## 2019-08-28 NOTE — Progress Notes (Deleted)
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  She has been treated with prednisone and methotrexate.  She has had high levels of muscle enzyme levels.  When last seen, was sent for [facility] due to increased back pain and leg pain.  She is on gabapentin.  HISTORY 12/22/2018 Dr. Jannifer Franklin: [name] [name] is an 86 year old right-handed black female with a history of lumbar spinal stenosis at the L3-4 and at the L4-5 level, she did not have decompressive surgery on the back.  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  She has been treated with prednisone, she is on a slow taper, currently at 8 mg daily.  The patient is on methotrexate 10 mg once a week.  She has had normalization of her muscle enzyme levels, she continues to have weakness proximally in both legs, she fell 2 weeks ago but did not injure herself.  She normally walks with a walker.  Her husband passed away 2 weeks ago, she has been under some stress, she is not sleeping well.  She has had an increase in her back pain and pain down the legs, left greater than right.  She takes gabapentin 100 mg at night.  She returns to this office for an evaluation.  She currently is living alone, she indicates she may need to change her living situation.  REVIEW OF SYSTEMS: Out of a complete 14 system review of symptoms, the patient complains only of the following symptoms, and all other reviewed systems are negative.  ALLERGIES: Allergies  Allergen Reactions  . Cellcept [Mycophenolate Mofetil]     Muscle cramps, mouth soreness, difficulty  swallowing, bruising  . Statins     Dx of in  . Ciprofloxacin Hcl Other (See Comments)    Chills, sick, could not tolerate it  . Hydrocodone Nausea Only  . Penicillins Rash    HOME MEDICATIONS: Outpatient Medications Prior to Visit  Medication Sig Dispense Refill  . acetaminophen (TYLENOL) 650 MG CR tablet Take 650 mg by mouth every 6 (six) hours.    Marland Kitchen apixaban (ELIQUIS) 5 MG TABS tablet Take 1 tablet (5 mg total) by mouth 2 (two) times daily. 180 tablet 0  . ergocalciferol (VITAMIN D2) 1.25 MG (50000 UT) capsule Take 1 capsule (50,000 Units total) by mouth once a week. One capsule once weekly  Started takinjg in January 2021, will take for 6 months only and re check) 12 capsule 1  . ezetimibe (ZETIA) 10 MG tablet TAKE 1 TABLET EVERY DAY 90 tablet 3  . folic acid (FOLVITE) 1 MG tablet Take 1 tablet (1 mg total) by mouth daily. 90 tablet 3  . gabapentin (NEURONTIN) 100 MG capsule Take 1 capsule (100 mg total) by mouth at bedtime. 90 capsule 3  . LINZESS 145 MCG CAPS capsule Take 145 mcg by mouth daily as needed.     . magnesium oxide (MAG-OX) 400 (241.3 Mg) MG tablet Take 1 tablet (400 mg total) by mouth daily. 30 tablet 2  . NON FORMULARY Fluid restriction of 1.8 liters per day (17101m total) 7-3 = 800 ml 3-11 = 800 ml 11-7 = 175  ml Document totals qshift. Every Shift Day, Evening, Nigh    . NON FORMULARY Diet: _____ Regular, __x____ NAS, _______Consistent Carbohydrate, _______NPO _____Other    . pantoprazole (PROTONIX) 40 MG tablet Take 1 tablet (40 mg total) by mouth daily. 30 tablet 3  . Potassium Chloride ER 20 MEQ TBCR Take 20 mEq by mouth every Tuesday, Thursday, Saturday, and Sunday. Take only while on Lasix 20 tablet 1   No facility-administered medications prior to visit.    PAST MEDICAL HISTORY: Past Medical History:  Diagnosis Date  . Arthritis    spinal stenosis  . Elevated LFTs    secondary to fatty liver, negative work-up in 2011  . Hypercholesterolemia   .  Hypertension   . Lumbar spinal stenosis 01/14/2018   L3-4 and L4-5  . Myopathy 02/15/2018  . Osteoarthritis    right knee  . Peripheral neuropathy 07/06/2018  . PONV (postoperative nausea and vomiting)   . Uterine cancer ([place]) 08/2006   grade 1, no recurrence up to 2013    PAST SURGICAL HISTORY: Past Surgical History:  Procedure Laterality Date  . 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 APPENDECTOMY  1973  . BREAST BIOPSY Left 2018   Benign  . CHOLECYSTECTOMY  1973  . COLONOSCOPY    06/21/2007   XKG:[id number] rectum/Sigmoid diverticula, diminutive hepatic flexure polyp s/p bx. Benign.   . COLONOSCOPY N/A 10/17/2012   Dr. Oneida Alar- moderate diverticulosis was noted in the sigmoid colon, moderate sized internal hemorrhoids. next tcs in10 years  . ESOPHAGOGASTRODUODENOSCOPY N/A 03/20/2016   Dr. Oneida Alar, widely patent Schatzki ring, anemia due to ASA induced erosive gastritis, mild duodenitis. gastric bx benign without H.pylori.   Marland Kitchen FOOT SURGERY  2007   Pins in toes on left foot, 5 to 6 yrs ago  . GIVENS CAPSULE STUDY N/A 03/20/2016   Procedure: GIVENS CAPSULE STUDY;  Surgeon: Danie Binder, MD;  [facility];  Service: Endoscopy;  Laterality: N/A;  . MUSCLE BIOPSY Left 06/07/2018   Procedure: LEFT THIGH MUSCLE BIOPSY;  Surgeon: Erroll Luna, MD;  Location: [place];  Service: General;  Laterality: Left;  . TONSILLECTOMY      FAMILY HISTORY: Family History  Problem Relation Age of Onset  . Heart disease Father   . Bladder Cancer Brother        in remission   . Hypertension Sister   . Dementia Sister   . Diabetes type II Sister   . Hypertension Sister   . Aneurysm Sister   . Hypertension Brother   . Arthritis Brother   . Hypertension Son   . Colon cancer Neg Hx     SOCIAL HISTORY: Social History   Socioeconomic History  . Marital status: Married    Spouse name: Jeneen Rinks   . Number of children: 3  . Years of education: 2  .  Highest education level: 12th grade  Occupational History  . Occupation: retired   Tobacco Use  . Smoking status: Never Smoker  . Smokeless tobacco: Never Used  . Tobacco comment: Never smoked  Substance and Sexual Activity  . Alcohol use: No  . Drug use: No  . Sexual activity: Not Currently    Birth control/protection: Post-menopausal, Surgical  Other Topics Concern  . Not on file  Social History Narrative   Husband Jeneen Rinks recently passed away   Caffeine use: soda daily   Right handed    Social Determinants of Health   Financial Resource Strain:   .  Difficulty of Paying Living Expenses:   Food Insecurity:   . Worried About Charity fundraiser in the Last Year:   . Arboriculturist in the Last Year:   Transportation Needs:   . Film/video editor (Medical):   Marland Kitchen Lack of Transportation (Non-Medical):   Physical Activity:   . Days of Exercise per Week:   . Minutes of Exercise per Session:   Stress:   . Feeling of Stress :   Social Connections: Unknown  . Frequency of Communication with Friends and Family: Not on file  . Frequency of Social Gatherings with Friends and Family: Not on file  . Attends Religious Services: Not on file  . Active Member of Clubs or Organizations: No  . Attends Archivist Meetings: Not on file  . Marital Status: Widowed  Intimate Partner Violence:   . Fear of Current or Ex-Partner:   . Emotionally Abused:   Marland Kitchen Physically Abused:   . Sexually Abused:       PHYSICAL EXAM  There were no vitals filed for this visit. There is no height or weight on file to calculate BMI.  Generalized: Well developed, in no acute distress   Neurological examination  Mentation: Alert oriented to time, place, history taking. Follows all commands speech and language fluent Cranial nerve II-XII: Pupils were equal round reactive to light. Extraocular movements were full, visual field were full on confrontational test. Facial sensation and strength were  normal. Uvula tongue midline. Head turning and shoulder shrug  were normal and symmetric. Motor: The motor testing reveals 5 over 5 strength of all 4 extremities. Good symmetric motor tone is noted throughout.  Sensory: Sensory testing is intact to soft touch on all 4 extremities. No evidence of extinction is noted.  Coordination: Cerebellar testing reveals good finger-nose-finger and heel-to-shin bilaterally.  Gait and station: Gait is normal. Tandem gait is normal. Romberg is negative. No drift is seen.  Reflexes: Deep tendon reflexes are symmetric and normal bilaterally.   DIAGNOSTIC DATA (LABS, IMAGING, TESTING) - I reviewed patient records, labs, notes, testing and imaging myself where available.  Lab Results  Component Value Date   WBC 6.6 08/23/2019   HGB 8.6 (L) 08/23/2019   HCT 29.3 (L) 08/23/2019   MCV 99.0 08/23/2019   PLT 340 08/23/2019      Component Value Date/Time   NA 144 08/23/2019 0940   NA 142 10/24/2018 1520   K 3.3 (L) 08/23/2019 0940   CL 106 08/23/2019 0940   CO2 27 08/23/2019 0940   GLUCOSE 132 (H) 08/23/2019 0940   BUN 40 (H) 08/23/2019 0940   BUN 32 (H) 10/24/2018 1520   CREATININE 0.93 08/23/2019 0940   CREATININE 0.82 01/04/2019 1317   CALCIUM 9.6 08/23/2019 0940   PROT 6.6 08/23/2019 0940   PROT 5.9 (L) 10/24/2018 1520   ALBUMIN 3.2 (L) 08/23/2019 0940   ALBUMIN 4.3 10/24/2018 1520   AST 45 (H) 08/23/2019 0940   ALT 34 08/23/2019 0940   ALKPHOS 62 08/23/2019 0940   BILITOT 0.7 08/23/2019 0940   BILITOT 0.6 10/24/2018 1520   GFRNONAA 56 (L) 08/23/2019 0940   GFRNONAA 66 01/04/2019 1317   GFRAA >60 08/23/2019 0940   GFRAA 76 01/04/2019 1317   Lab Results  Component Value Date   CHOL 221 (H) 01/04/2019   HDL 99 01/04/2019   LDLCALC 106 (H) 01/04/2019   TRIG 73 01/04/2019   CHOLHDL 2.2 01/04/2019   Lab Results  Component Value  Date   HGBA1C 5.7 (H) 01/04/2019   Lab Results  Component Value Date   VITAMINB12 319 08/23/2019   Lab  Results  Component Value Date   TSH 1.79 01/04/2019      ASSESSMENT AND PLAN 86 y.o. year old female  has a past medical history of Arthritis, Elevated LFTs, Hypercholesterolemia, Hypertension, Lumbar spinal stenosis (01/14/2018), Myopathy (02/15/2018), Osteoarthritis, Peripheral neuropathy (07/06/2018), PONV (postoperative nausea and vomiting), and Uterine cancer ([place]) (08/2006). here with ***   I spent 15 minutes with the patient. 50% of this time was spent   Butler Denmark, [place], DNP 08/28/2019, 5:40 AM [facility] [address] [place], [place] [address] [phone]

## 2019-08-28 NOTE — Progress Notes (Signed)
Location:  [place] Room Number: 149-D Place of Service:  SNF (31)    CODE STATUS:  FULL CODE  Allergies  Allergen Reactions  . Cellcept [Mycophenolate Mofetil]     Muscle cramps, mouth soreness, difficulty swallowing, bruising  . Statins     Dx of in  . Ciprofloxacin Hcl Other (See Comments)    Chills, sick, could not tolerate it  . Hydrocodone Nausea Only  . Penicillins Rash    Chief Complaint  Patient presents with  . Discharge Note    Patient is seen for discharge from SNF on 08/29/19    HPI:  She is being discharged to home with home health for pt/ot, she will need her prescriptions written and will need to follow up with her medical provider. She had been hospitalized left knee hemiarthrosis. She was admitted to this facility for short term rehab. She has participated in therapy and is now ready to complete therapy on a home health basis.    Past Medical History:  Diagnosis Date  . Arthritis    spinal stenosis  . Elevated LFTs    secondary to fatty liver, negative work-up in 2011  . Hypercholesterolemia   . Hypertension   . Lumbar spinal stenosis 01/14/2018   L3-4 and L4-5  . Myopathy 02/15/2018  . Osteoarthritis    right knee  . Peripheral neuropathy 07/06/2018  . PONV (postoperative nausea and vomiting)   . Uterine cancer ([place]) 08/2006   grade 1, no recurrence up to 2013    Past Surgical History:  Procedure Laterality Date  . 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 APPENDECTOMY  1973  . BREAST BIOPSY Left 2018   Benign  . CHOLECYSTECTOMY  1973  . COLONOSCOPY    06/21/2007   MHD:[id number] rectum/Sigmoid diverticula, diminutive hepatic flexure polyp s/p bx. Benign.   . COLONOSCOPY N/A 10/17/2012   Dr. Oneida Alar- moderate diverticulosis was noted in the sigmoid colon, moderate sized internal hemorrhoids. next tcs in10 years  . ESOPHAGOGASTRODUODENOSCOPY N/A 03/20/2016   Dr. Oneida Alar, widely patent Schatzki ring, anemia  due to ASA induced erosive gastritis, mild duodenitis. gastric bx benign without H.pylori.   Marland Kitchen FOOT SURGERY  2007   Pins in toes on left foot, 5 to 6 yrs ago  . GIVENS CAPSULE STUDY N/A 03/20/2016   Procedure: GIVENS CAPSULE STUDY;  Surgeon: Danie Binder, MD;  [facility];  Service: Endoscopy;  Laterality: N/A;  . MUSCLE BIOPSY Left 06/07/2018   Procedure: LEFT THIGH MUSCLE BIOPSY;  Surgeon: Erroll Luna, MD;  Location: [place];  Service: General;  Laterality: Left;  . TONSILLECTOMY      Social History   Socioeconomic History  . Marital status: Married    Spouse name: Jeneen Rinks   . Number of children: 3  . Years of education: 76  . Highest education level: 12th grade  Occupational History  . Occupation: retired   Tobacco Use  . Smoking status: Never Smoker  . Smokeless tobacco: Never Used  . Tobacco comment: Never smoked  Substance and Sexual Activity  . Alcohol use: No  . Drug use: No  . Sexual activity: Not Currently    Birth control/protection: Post-menopausal, Surgical  Other Topics Concern  . Not on file  Social History Narrative   Husband Jeneen Rinks recently passed away   Caffeine use: soda daily   Right handed    Social Determinants of Health   Financial Resource Strain:   .  Difficulty of Paying Living Expenses:   Food Insecurity:   . Worried About Charity fundraiser in the Last Year:   . Arboriculturist in the Last Year:   Transportation Needs:   . Film/video editor (Medical):   Marland Kitchen Lack of Transportation (Non-Medical):   Physical Activity:   . Days of Exercise per Week:   . Minutes of Exercise per Session:   Stress:   . Feeling of Stress :   Social Connections: Unknown  . Frequency of Communication with Friends and Family: Not on file  . Frequency of Social Gatherings with Friends and Family: Not on file  . Attends Religious Services: Not on file  . Active Member of Clubs or Organizations: No  . Attends Archivist  Meetings: Not on file  . Marital Status: Widowed  Intimate Partner Violence:   . Fear of Current or Ex-Partner:   . Emotionally Abused:   Marland Kitchen Physically Abused:   . Sexually Abused:    Family History  Problem Relation Age of Onset  . Heart disease Father   . Bladder Cancer Brother        in remission   . Hypertension Sister   . Dementia Sister   . Diabetes type II Sister   . Hypertension Sister   . Aneurysm Sister   . Hypertension Brother   . Arthritis Brother   . Hypertension Son   . Colon cancer Neg Hx     VITAL SIGNS BP (!) 134/59   Pulse 69   Temp (!) 97.3 F (36.3 C) (Oral)   Resp 16   Ht 5' 4"  (1.626 m)   Wt 144 lb 3.2 oz (65.4 kg)   SpO2 98%   BMI 24.75 kg/m   Patient's Medications  New Prescriptions   No medications on file  Previous Medications   ACETAMINOPHEN (TYLENOL) 650 MG CR TABLET    Take 650 mg by mouth every 6 (six) hours.   APIXABAN (ELIQUIS) 5 MG TABS TABLET    Take 1 tablet (5 mg total) by mouth 2 (two) times daily.   DICLOFENAC SODIUM (VOLTAREN) 1 % GEL    Apply 2 g topically every 6 (six) hours as needed.   ERGOCALCIFEROL (VITAMIN D2) 1.25 MG (50000 UT) CAPSULE    Take 1 capsule (50,000 Units total) by mouth once a week. One capsule once weekly  Started takinjg in January 2021, will take for 6 months only and re check)   EZETIMIBE (ZETIA) 10 MG TABLET    TAKE 1 TABLET EVERY DAY   FOLIC ACID (FOLVITE) 1 MG TABLET    Take 1 tablet (1 mg total) by mouth daily.   FUROSEMIDE (LASIX) 40 MG TABLET    Take 40 mg by mouth daily.   GABAPENTIN (NEURONTIN) 100 MG CAPSULE    Take 1 capsule (100 mg total) by mouth at bedtime.   LINZESS 145 MCG CAPS CAPSULE    Take 145 mcg by mouth daily as needed.    MAGNESIUM OXIDE (MAG-OX) 400 (241.3 MG) MG TABLET    Take 1 tablet (400 mg total) by mouth daily.   NON FORMULARY    Fluid restriction of 1.8 liters per day (1786m total) 7-3 = 800 ml 3-11 = 800 ml 11-7 = 175 ml Document totals qshift. Every Shift Day,  Evening, Nigh   NON FORMULARY    Diet: _____ Regular, __x____ NAS, _______Consistent Carbohydrate, _______NPO _____Other   PANTOPRAZOLE (PROTONIX) 40 MG TABLET  Take 1 tablet (40 mg total) by mouth daily.   POTASSIUM CHLORIDE ER 20 MEQ TBCR    Take 20 mEq by mouth every Tuesday, Thursday, Saturday, and Sunday. Take only while on Lasix  Modified Medications   No medications on file  Discontinued Medications   No medications on file     SIGNIFICANT DIAGNOSTIC EXAMS   PREVIOUS  08-04-19: chest x-ray: No active disease.   08-05-19: bilateral lower extremity venous doppler:  1. 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 2. No evidence of acute or chronic DVT within the right lower extremity.  NO NEW EXAMS     LABS REVIEWED PREVIOUS  08-04-19: wbc 6.6; hgb 8.9; hct 29.2; mcv 97.7 plt 228; glucose 125; bun 33; creat 0.58 k+ 3.2; na++ 142 ca 8.9 liver normal albumin 3.2 mag 1.9  08-07-19: wbc 6.7; hgb 7.8; hct 25.6; mcv 98.5 plt 226; glucose 121; bun 31; creat 0.73 ;k+ 4.2; na++ 142; ca 8.8  08-10-19: wbc 5.4; hgb 7.4; hct 24.4; mcv 97.6 plt 271;  08-14-19 hgb 7.7; hct 26.0; glucose 92; bun 38; creat 0.82; k+ 4.0; na++ 142; ca 9.1  08-22-19: urine culture: proteus mirabilis: rocephin 08-23-19: wbc 6.6; hgb 8.6; hct 29.3; mcv 99.0 plt 340; glucose 132; bun 40; creat 0.93; k+ 3.3; na++ 144; ca 9.6 liver normal albumin 3.2 vit B 12: 319; foalte 102; ferritin 142; iron 48; tibc 298  NO NEW LABS.     Review of Systems  Constitutional: Negative for malaise/fatigue.  Respiratory: Negative for cough and shortness of breath.   Cardiovascular: Negative for chest pain, palpitations and leg swelling.  Gastrointestinal:  Negative for abdominal pain, constipation and heartburn.  Musculoskeletal: Negative for back pain, joint pain and myalgias.  Skin: Negative.   Neurological: Negative for dizziness.  Psychiatric/Behavioral: The patient is not nervous/anxious.     Physical Exam Constitutional:      General: She is not in acute distress.    Appearance: She is well-developed. She is not diaphoretic.  Neck:     Thyroid: No thyromegaly.  Cardiovascular:     Rate and Rhythm: Normal rate and regular rhythm.     Pulses: Normal pulses.     Heart sounds: Normal heart sounds.  Pulmonary:     Effort: Pulmonary effort is normal. No respiratory distress.     Breath sounds: Normal breath sounds.  Abdominal:     General: Bowel sounds are normal. There is no distension.     Palpations: Abdomen is soft.     Tenderness: There is no abdominal tenderness.  Musculoskeletal:        General: Normal range of motion.     Cervical back: Neck supple.     Right lower leg: Edema present.     Left lower leg: Edema present.  Lymphadenopathy:     Cervical: No cervical adenopathy.  Skin:    General: Skin is warm and dry.  Neurological:     Mental Status: She is alert. Mental status is at baseline.  Psychiatric:        Mood and Affect: Mood normal.      ASSESSMENT/ PLAN:  Patient is being discharged with the following home health services:  Pt/ot to evaluate and treat as indicated  for gait balance strength adl training.   Patient is being discharged with the following durable medical equipment:  Bsc; front wheel walker to allow her to maintain her current level of independence with her adls.   Patient has been advised to f/u with their PCP in 1-2 weeks to bring them up to date on their rehab stay.  Social services at facility was responsible for arranging this appointment.  Pt was provided with a 30 day supply of prescriptions for medications and refills must be obtained from their PCP.  For controlled substances, a more  limited supply may be provided adequate until PCP appointment only.  A 30 day supply of her prescription medications have been sent ot [facility] in [place]    Time spent with patient 35 minutes: medications; home health dme.     Ok Edwards NP [facility]  Contact [phone] Monday through Friday 8am- 5pm  After hours call [phone]

## 2019-08-28 NOTE — Telephone Encounter (Signed)
Noted  

## 2019-08-29 ENCOUNTER — Other Ambulatory Visit: Payer: Self-pay | Admitting: Adult Health

## 2019-08-31 ENCOUNTER — Other Ambulatory Visit: Payer: Self-pay

## 2019-08-31 NOTE — Patient Outreach (Signed)
[facility]) Care Management  08/31/2019  [name] [name] 1932-12-28 [id number]     Transition of Care Referral  Referral Date: 08/31/2019 Referral Source: [facility] Discharge Report Date of Discharge: 08/29/2019 Facility: [facility] Insurance: [facility]    Referral received. Transition of care calls being completed via EMMI-automated calls. RN CM will outreach patient for any red flags received.     Plan: RN CM will close case at this time.    [name], RN,BSN,CCM [facility] Telephonic Care Management Coordinator Direct Phone: [phone] Toll Free: [phone] Fax: [phone]

## 2019-09-04 ENCOUNTER — Telehealth: Payer: Self-pay

## 2019-09-04 NOTE — Telephone Encounter (Signed)
Calling to get Labs faxed over to [phone]---[id number]

## 2019-09-05 ENCOUNTER — Inpatient Hospital Stay (HOSPITAL_BASED_OUTPATIENT_CLINIC_OR_DEPARTMENT_OTHER): Payer: Medicare HMO | Admitting: Hematology

## 2019-09-05 ENCOUNTER — Inpatient Hospital Stay (HOSPITAL_COMMUNITY): Payer: Medicare HMO

## 2019-09-05 VITALS — BP 104/73 | HR 76 | Temp 97.1°F | Resp 18

## 2019-09-05 DIAGNOSIS — I82532 Chronic embolism and thrombosis of left popliteal vein: Secondary | ICD-10-CM | POA: Diagnosis not present

## 2019-09-05 DIAGNOSIS — Z90710 Acquired absence of both cervix and uterus: Secondary | ICD-10-CM | POA: Diagnosis not present

## 2019-09-05 DIAGNOSIS — Z8542 Personal history of malignant neoplasm of other parts of uterus: Secondary | ICD-10-CM | POA: Diagnosis not present

## 2019-09-05 DIAGNOSIS — I82412 Acute embolism and thrombosis of left femoral vein: Secondary | ICD-10-CM

## 2019-09-05 DIAGNOSIS — Z7901 Long term (current) use of anticoagulants: Secondary | ICD-10-CM | POA: Diagnosis not present

## 2019-09-05 DIAGNOSIS — Z86718 Personal history of other venous thrombosis and embolism: Secondary | ICD-10-CM | POA: Diagnosis not present

## 2019-09-05 LAB — VITAMIN B12: Vitamin B-12: 270 pg/mL (ref 180–914)

## 2019-09-05 LAB — D-DIMER, QUANTITATIVE: D-Dimer, Quant: 2.9 ug/mL-FEU — ABNORMAL HIGH (ref 0.00–0.50)

## 2019-09-05 NOTE — Patient Instructions (Signed)
[place] at [facility] Discharge Instructions  You were seen today by Dr. Delton Coombes. He went over your recent results. Stop taking the Eliquis and monitor for any symptoms of blood clots. You will be seen by a PA in 2 weeks for ultrasound and follow up.   Thank you for choosing [place] at [facility] to provide your oncology and hematology care.  To afford each patient quality time with our provider, please arrive at least 15 minutes before your scheduled appointment time.   If you have a lab appointment with the [facility] please come in thru the Main Entrance and check in at the main information desk  You need to re-schedule your appointment should you arrive 10 or more minutes late.  We strive to give you quality time with our providers, and arriving late affects you and other patients whose appointments are after yours.  Also, if you no show three or more times for appointments you may be dismissed from the clinic at the providers discretion.     Again, thank you for choosing [facility].  Our hope is that these requests will decrease the amount of time that you wait before being seen by our physicians.       _____________________________________________________________  Should you have questions after your visit to [facility], please contact our office at [phone] between the hours of 8:00 a.m. and 4:30 p.m.  Voicemails left after 4:00 p.m. will not be returned until the following business day.  For prescription refill requests, have your pharmacy contact our office and allow 72 hours.    Cancer Center Support Programs:   > Cancer Support Group  2nd Tuesday of the month 1pm-2pm, Journey Room

## 2019-09-05 NOTE — Progress Notes (Signed)
[place], [place] [address]   CLINIC:  Medical Oncology/Hematology  PCP:  Fayrene Helper, MD [address] [place] / [place] [address]  [phone]  REASON FOR VISIT:  Follow-up for DVT of left femoral vein  CURRENT THERAPY: Eliquis  INTERVAL HISTORY:  Ms. KEYLY BALDONADO, a 86 y.o. female, returns for routine follow-up for her DVT of left femoral vein. Gita was last seen on 08/24/2019.  Today she is accompanied by her daughter. She reports that her left knee is still swollen and painful limiting walking.  She is still at [facility].   REVIEW OF SYSTEMS:  Review of Systems  Constitutional: Positive for fatigue (severe). Negative for appetite change.  Gastrointestinal: Positive for constipation.  Neurological: Positive for headaches.  Psychiatric/Behavioral: The patient is nervous/anxious.   All other systems reviewed and are negative.   PAST MEDICAL/SURGICAL HISTORY:  Past Medical History:  Diagnosis Date   Arthritis    spinal stenosis   Elevated LFTs    secondary to fatty liver, negative work-up in 2011   Hypercholesterolemia    Hypertension    Lumbar spinal stenosis 01/14/2018   L3-4 and L4-5   Myopathy 02/15/2018   Osteoarthritis    right knee   Peripheral neuropathy 07/06/2018   PONV (postoperative nausea and vomiting)    Uterine cancer ([place]) 08/2006   grade 1, no recurrence up to 2013   Past Surgical History:  Procedure Laterality Date   ABDOMINAL HYSTERECTOMY  2008   adenocarcinoma stage 1   APPENDECTOMY  1973   BREAST BIOPSY Left 2018   Benign   CHOLECYSTECTOMY  1973   COLONOSCOPY    06/21/2007   TXH:FSFSEL rectum/Sigmoid diverticula, diminutive hepatic flexure polyp s/p bx. Benign.    COLONOSCOPY N/A 10/17/2012   Dr. Oneida Alar- moderate diverticulosis was noted in the sigmoid colon, moderate sized internal hemorrhoids. next tcs in10 years   ESOPHAGOGASTRODUODENOSCOPY N/A  03/20/2016   Dr. Oneida Alar, widely patent Schatzki ring, anemia due to ASA induced erosive gastritis, mild duodenitis. gastric bx benign without H.pylori.    FOOT SURGERY  2007   Pins in toes on left foot, 5 to 6 yrs ago   [place] N/A 03/20/2016   Procedure: GIVENS CAPSULE STUDY;  Surgeon: Danie Binder, MD;  Location: [facility];  Service: Endoscopy;  Laterality: N/A;   MUSCLE BIOPSY Left 06/07/2018   Procedure: LEFT THIGH MUSCLE BIOPSY;  Surgeon: Erroll Luna, MD;  Location: [facility];  Service: General;  Laterality: Left;   TONSILLECTOMY      SOCIAL HISTORY:  Social History   Socioeconomic History   Marital status: Married    Spouse name: Jeneen Rinks    Number of children: 3   Years of education: 12   Highest education level: 12th grade  Occupational History   Occupation: retired   Tobacco Use   Smoking status: Never Smoker   Smokeless tobacco: Never Used   Tobacco comment: Never smoked  Vaping Use   Vaping Use: Never used  Substance and Sexual Activity   Alcohol use: No   Drug use: No   Sexual activity: Not Currently    Birth control/protection: Post-menopausal, Surgical  Other Topics Concern   Not on file  Social History Narrative   Husband Jeneen Rinks recently passed away   Caffeine use: soda daily   Right handed    Social Determinants of Health   Financial Resource Strain:  Difficulty of Paying Living Expenses:   Food Insecurity:    Worried About Charity fundraiser in the Last Year:    Arboriculturist in the Last Year:   Transportation Needs:    Film/video editor (Medical):    Lack of Transportation (Non-Medical):   Physical Activity:    Days of Exercise per Week:    Minutes of Exercise per Session:   Stress:    Feeling of Stress :   Social Connections: Unknown   Frequency of Communication with Friends and Family: Not on file   Frequency of Social Gatherings with Friends and Family: Not on file    Attends Religious Services: Not on file   Active Member of Clubs or Organizations: No   Attends Archivist Meetings: Not on file   Marital Status: Widowed  Human resources officer Violence:    Fear of Current or Ex-Partner:    Emotionally Abused:    Physically Abused:    Sexually Abused:     FAMILY HISTORY:  Family History  Problem Relation Age of Onset   Heart disease Father    Bladder Cancer Brother        in remission    Hypertension Sister    Dementia Sister    Diabetes type II Sister    Hypertension Sister    Aneurysm Sister    Hypertension Brother    Arthritis Brother    Hypertension Son    Colon cancer Neg Hx     CURRENT MEDICATIONS:  Current Outpatient Medications  Medication Sig Dispense Refill   apixaban (ELIQUIS) 5 MG TABS tablet Take 1 tablet (5 mg total) by mouth 2 (two) times daily. 60 tablet 0   ergocalciferol (VITAMIN D2) 1.25 MG (50000 UT) capsule Take 1 capsule (50,000 Units total) by mouth once a week. One capsule once weekly  Started takinjg in January 2021, will take for 6 months only and re check) 4 capsule 0   ezetimibe (ZETIA) 10 MG tablet Take 1 tablet (10 mg total) by mouth daily. 30 tablet 0   folic acid (FOLVITE) 1 MG tablet Take 1 tablet (1 mg total) by mouth daily. 30 tablet 0   furosemide (LASIX) 40 MG tablet Take 1 tablet (40 mg total) by mouth daily. 30 tablet 0   gabapentin (NEURONTIN) 100 MG capsule Take 1 capsule (100 mg total) by mouth at bedtime. 30 capsule 0   LINZESS 145 MCG CAPS capsule Take 1 capsule (145 mcg total) by mouth daily as needed. 30 capsule 0   magnesium oxide (MAG-OX) 400 (241.3 Mg) MG tablet Take 1 tablet (400 mg total) by mouth daily. 30 tablet 0   pantoprazole (PROTONIX) 40 MG tablet Take 1 tablet (40 mg total) by mouth daily. 30 tablet 0   Potassium Chloride ER 20 MEQ TBCR Take 20 mEq by mouth every Tuesday, Thursday, Saturday, and Sunday. Take only while on Lasix 20 tablet 0   No  current facility-administered medications for this visit.    ALLERGIES:  Allergies  Allergen Reactions   Cellcept [Mycophenolate Mofetil]     Muscle cramps, mouth soreness, difficulty swallowing, bruising   Statins     Dx of in   Ciprofloxacin Hcl Other (See Comments)    Chills, sick, could not tolerate it   Hydrocodone Nausea Only   Penicillins Rash    PHYSICAL EXAM:  Performance status (ECOG): 2 - Symptomatic, <50% confined to bed  Vitals:   09/05/19 1547  BP: 104/73  Pulse:  76  Resp: 18  Temp: (!) 97.1 F (36.2 C)  SpO2: 100%   Wt Readings from Last 3 Encounters:  08/28/19 144 lb 3.2 oz (65.4 kg)  08/24/19 152 lb (68.9 kg)  08/24/19 152 lb 12.8 oz (69.3 kg)   Physical Exam Vitals reviewed.  Constitutional:      Appearance: Normal appearance.  Cardiovascular:     Rate and Rhythm: Normal rate and regular rhythm.     Pulses: Normal pulses.     Heart sounds: Normal heart sounds.  Pulmonary:     Effort: Pulmonary effort is normal.     Breath sounds: Normal breath sounds.  Musculoskeletal:     Left knee: Effusion present.  Neurological:     General: No focal deficit present.     Mental Status: She is alert and oriented to person, place, and time.  Psychiatric:        Mood and Affect: Mood normal.        Behavior: Behavior normal.     LABORATORY DATA:  I have reviewed the labs as listed.  CBC Latest Ref Rng & Units 08/23/2019 08/14/2019 08/10/2019  WBC 4.0 - 10.5 K/uL 6.6 - 5.4  Hemoglobin 12.0 - 15.0 g/dL 8.6(L) 7.7(L) 7.4(L)  Hematocrit 36 - 46 % 29.3(L) 26.0(L) 24.4(L)  Platelets 150 - 400 K/uL 340 - 271   CMP Latest Ref Rng & Units 08/23/2019 08/14/2019 08/07/2019  Glucose 70 - 99 mg/dL 132(H) 92 121(H)  BUN 8 - 23 mg/dL 40(H) 38(H) 31(H)  Creatinine 0.44 - 1.00 mg/dL 0.93 0.82 0.73  Sodium 135 - 145 mmol/L 144 142 142  Potassium 3.5 - 5.1 mmol/L 3.3(L) 4.0 4.2  Chloride 98 - 111 mmol/L 106 104 104  CO2 22 - 32 mmol/L 27 29 29   Calcium 8.9 - 10.3  mg/dL 9.6 9.1 8.8(L)  Total Protein 6.5 - 8.1 g/dL 6.6 - -  Total Bilirubin 0.3 - 1.2 mg/dL 0.7 - -  Alkaline Phos 38 - 126 U/L 62 - -  AST 15 - 41 U/L 45(H) - -  ALT 0 - 44 U/L 34 - -      Component Value Date/Time   RBC 2.96 (L) 08/23/2019 0940   MCV 99.0 08/23/2019 0940   MCV 88 10/24/2018 1520   MCH 29.1 08/23/2019 0940   MCHC 29.4 (L) 08/23/2019 0940   RDW 14.4 08/23/2019 0940   RDW 13.2 10/24/2018 1520   LYMPHSABS 1.0 08/23/2019 0940   LYMPHSABS 0.7 10/24/2018 1520   MONOABS 0.4 08/23/2019 0940   EOSABS 0.1 08/23/2019 0940   EOSABS 0.1 10/24/2018 1520   BASOSABS 0.0 08/23/2019 0940   BASOSABS 0.0 10/24/2018 1520    DIAGNOSTIC IMAGING:  I have independently reviewed the scans and discussed with the patient. US Venous Img Lower Bilateral  Result Date: 08/23/2019 CLINICAL DATA:  History of previous DVT now with lower extremity edema. Patient remains on anticoagulation. Evaluate for acute or chronic DVT. EXAM: BILATERAL LOWER EXTREMITY VENOUS DOPPLER ULTRASOUND TECHNIQUE: Gray-scale sonography with graded compression, as well as color Doppler and duplex ultrasound were performed to evaluate the lower extremity deep venous systems from the level of the common femoral vein and including the common femoral, femoral, profunda femoral, popliteal and calf veins including the posterior tibial, peroneal and gastrocnemius veins when visible. The superficial great saphenous vein was also interrogated. Spectral Doppler was utilized to evaluate flow at rest and with distal augmentation maneuvers in the common femoral, femoral and popliteal veins. COMPARISON:  Bilateral lower  extremity venous Doppler ultrasound-08/05/2019 (occlusive DVT involving the left popliteal vein extending to involve the left posterior tibial and one of the paired peroneal veins). FINDINGS: RIGHT LOWER EXTREMITY Common Femoral Vein: No evidence of thrombus. Normal compressibility, respiratory phasicity and response to  augmentation. Saphenofemoral Junction: No evidence of thrombus. Normal compressibility and flow on color Doppler imaging. Profunda Femoral Vein: No evidence of thrombus. Normal compressibility and flow on color Doppler imaging. Femoral Vein: No evidence of thrombus. Normal compressibility, respiratory phasicity and response to augmentation. Popliteal Vein: No evidence of thrombus. Normal compressibility, respiratory phasicity and response to augmentation. Calf Veins: No evidence of thrombus. Normal compressibility and flow on color Doppler imaging. Superficial Great Saphenous Vein: No evidence of thrombus. Normal compressibility. Venous Reflux:  None. Other Findings:  None. LEFT LOWER EXTREMITY Common Femoral Vein: No evidence of thrombus. Normal compressibility, respiratory phasicity and response to augmentation. Saphenofemoral Junction: No evidence of thrombus. Normal compressibility and flow on color Doppler imaging. Profunda Femoral Vein: No evidence of thrombus. Normal compressibility and flow on color Doppler imaging. Femoral Vein: No evidence of thrombus. Normal compressibility, respiratory phasicity and response to augmentation. Popliteal Vein: No evidence of acute or chronic thrombus. Normal compressibility, respiratory phasicity and response to augmentation. Calf Veins: Appear patent where imaged. Superficial Great Saphenous Vein: No evidence of thrombus. Normal compressibility. Venous Reflux:  None. Other Findings:  None. IMPRESSION: 1. No evidence of acute or chronic DVT within either lower extremity with special attention paid to the left popliteal and tibial veins. 2. Note made of an approximate 16.6 x 3.9 x 3.5 cm complex fluid collection extending from the left popliteal fossa to the proximal/mid aspect the left calf, not seen on the 08/05/2019 examination - differential considerations include a hematoma (favored), leaking Baker's cyst or (less likely) abscess or seroma. Electronically Signed   By:  Sandi Mariscal M.D.   On: 08/23/2019 13:38     ASSESSMENT:  1.  Weakly provoked left leg DVT: -Status post fall prior to presentation to the ER on 02/05/2019 with left leg swelling.  Doppler showed occlusive DVT extending from proximal left femoral vein through left popliteal vein. -He was started on Eliquis since then.  Eliquis was discontinued around 06/06/2019.  D-dimer was 1.42 on same day. -I have recommended restarting Eliquis on 06/13/2019.  She has been taking since then. -Ultrasound of the leg on 08/05/2019 showed occlusive DVT involving left popliteal vein with extension to left posterior tibial vein and one of the paired left peroneal veins similar to 01/2019 examination. -Ultrasound on 08/23/2019 showed no evidence of acute or chronic DVT within the either lower extremity with special attention paid to the left popliteal and tibial veins.  There is a 16.6 x 3.9 x 3.5 cm complex fluid collection extending from the left popliteal fossa to the proximal/mid aspect of the left calf, not seen on 08/05/2019.  2.  Endometrial adenocarcinoma: -She had T1b N0 well-differentiated endometrioid adenocarcinoma, FIGO grade 1, TAH/BSO with lymphadenectomy done on 12/14/2006.  She did not require any adjuvant therapy. -CT scan on 03/06/2019 did not show any evidence of recurrence or metastasis.   PLAN:  1.  Weakly provoked left leg DVT: -She is currently on Eliquis.  Reportedly developed hemarthrosis and is unable to walk because of the pain. -Her Doppler on 08/23/2019 was negative for DVT.  Doppler on 08/05/2019 was positive. -D-dimer today is elevated at 2.9. -I have discussed with vascular on-call who were reluctant to consider IVC filter. -Other option is to  discontinue Eliquis and monitor with serial Dopplers.  I have talked to her at length about risk of recurrent DVT. -She was told to discontinue Eliquis today.  She will reach out to Dr. Aline Brochure for her knee pain management. -She will be seen back in  2 weeks with repeat Doppler.  2.  Left knee pain: -She will continue tramadol 50 mg every 6 hours as needed.  3.  Endometrial adenocarcinoma: -CT scan on 03/06/2019 did not show any evidence of recurrence or metastatic disease.  Orders placed this encounter:  No orders of the defined types were placed in this encounter.    Derek Jack, MD [place] [phone]   I, Milinda Antis, am acting as a scribe for Dr. Sanda Linger.  I, Derek Jack MD, have reviewed the above documentation for accuracy and completeness, and I agree with the above.

## 2019-09-07 ENCOUNTER — Encounter (HOSPITAL_COMMUNITY)
Admission: RE | Admit: 2019-09-07 | Discharge: 2019-09-07 | Disposition: A | Discharge status: Home / Self Care | Payer: Medicare HMO | Source: Skilled Nursing Facility | Attending: Internal Medicine | Admitting: Internal Medicine

## 2019-09-07 DIAGNOSIS — I10 Essential (primary) hypertension: Secondary | ICD-10-CM | POA: Insufficient documentation

## 2019-09-07 DIAGNOSIS — E785 Hyperlipidemia, unspecified: Secondary | ICD-10-CM | POA: Insufficient documentation

## 2019-09-07 DIAGNOSIS — D649 Anemia, unspecified: Secondary | ICD-10-CM | POA: Insufficient documentation

## 2019-09-07 LAB — COMPREHENSIVE METABOLIC PANEL
ALT: 36 U/L (ref 0–44)
AST: 50 U/L — ABNORMAL HIGH (ref 15–41)
Albumin: 2.7 g/dL — ABNORMAL LOW (ref 3.5–5.0)
Alkaline Phosphatase: 57 U/L (ref 38–126)
Anion gap: 11 (ref 5–15)
BUN: 32 mg/dL — ABNORMAL HIGH (ref 8–23)
CO2: 30 mmol/L (ref 22–32)
Calcium: 9 mg/dL (ref 8.9–10.3)
Chloride: 103 mmol/L (ref 98–111)
Creatinine, Ser: 0.86 mg/dL (ref 0.44–1.00)
GFR calc Af Amer: >60 mL/min (ref >60)
GFR calc non Af Amer: >60 mL/min (ref >60)
Glucose, Bld: 82 mg/dL (ref 70–99)
Potassium: 3.4 mmol/L — ABNORMAL LOW (ref 3.5–5.1)
Sodium: 144 mmol/L (ref 135–145)
Total Bilirubin: 0.5 mg/dL (ref 0.3–1.2)
Total Protein: 5.9 g/dL — ABNORMAL LOW (ref 6.5–8.1)

## 2019-09-07 LAB — CBC WITH DIFFERENTIAL/PLATELET
Abs Immature Granulocytes: 0.01 10*3/uL (ref 0.00–0.07)
Basophils Absolute: 0 10*3/uL (ref 0.0–0.1)
Basophils Relative: 0 %
Eosinophils Absolute: 0.1 10*3/uL (ref 0.0–0.5)
Eosinophils Relative: 3 %
HCT: 28.8 % — ABNORMAL LOW (ref 36.0–46.0)
Hemoglobin: 8.5 g/dL — ABNORMAL LOW (ref 12.0–15.0)
Immature Granulocytes: 0 %
Lymphocytes Relative: 26 %
Lymphs Abs: 1 10*3/uL (ref 0.7–4.0)
MCH: 28.3 pg (ref 26.0–34.0)
MCHC: 29.5 g/dL — ABNORMAL LOW (ref 30.0–36.0)
MCV: 96 fL (ref 80.0–100.0)
Monocytes Absolute: 0.5 10*3/uL (ref 0.1–1.0)
Monocytes Relative: 14 %
Neutro Abs: 2.2 10*3/uL (ref 1.7–7.7)
Neutrophils Relative %: 57 %
Platelets: 272 10*3/uL (ref 150–400)
RBC: 3 MIL/uL — ABNORMAL LOW (ref 3.87–5.11)
RDW: 14 % (ref 11.5–15.5)
WBC: 3.9 10*3/uL — ABNORMAL LOW (ref 4.0–10.5)
nRBC: 0 % (ref 0.0–0.2)

## 2019-09-07 LAB — VITAMIN D 25 HYDROXY (VIT D DEFICIENCY, FRACTURES): Vit D, 25-Hydroxy: 91.04 ng/mL (ref 30–100)

## 2019-09-07 LAB — LIPID PANEL
Cholesterol: 177 mg/dL (ref 0–200)
HDL: 57 mg/dL (ref >40)
LDL Cholesterol: 106 mg/dL — ABNORMAL HIGH (ref 0–99)
Total CHOL/HDL Ratio: 3.1 RATIO
Triglycerides: 68 mg/dL (ref <150)
VLDL: 14 mg/dL (ref 0–40)

## 2019-09-08 ENCOUNTER — Encounter: Payer: Self-pay | Admitting: Orthopedic Surgery

## 2019-09-08 ENCOUNTER — Ambulatory Visit (INDEPENDENT_AMBULATORY_CARE_PROVIDER_SITE_OTHER): Payer: Medicare HMO | Admitting: Orthopedic Surgery

## 2019-09-08 VITALS — BP 118/72 | HR 76 | Ht 64.0 in

## 2019-09-08 DIAGNOSIS — M25062 Hemarthrosis, left knee: Secondary | ICD-10-CM

## 2019-09-08 NOTE — Progress Notes (Signed)
Chief Complaint  Patient presents with  . Knee Pain    Recheck on left knee getting worse / non ambulatory now     Reaccumulation of hemarthrosis left knee patient now off blood thinners considering filter  We aspirated 40 cc of blood from the left knee joint  We used Depo-Medrol injection afterwards  Procedure note injection and aspiration left knee joint  Verbal consent was obtained to aspirate and inject the left knee joint   Timeout was completed to confirm the site of aspiration and injection  An 18-gauge needle was used to aspirate the left knee joint from a suprapatellar lateral approach.  The medications used were 40 mg of Depo-Medrol and 1% lidocaine 3 cc  Anesthesia was provided by ethyl chloride and the skin was prepped with alcohol.  After cleaning the skin with alcohol an 18-gauge needle was used to aspirate the right knee joint.  We obtained 50  cc of fluid BLOOD   We followed this by injection of 40 mg of Depo-Medrol and 3 cc 1% lidocaine.  There were no complications. A sterile bandage was applied.  Encounter Diagnosis  Name Primary?  . Hemarthrosis of left knee Yes

## 2019-09-11 ENCOUNTER — Encounter: Payer: Self-pay | Admitting: Adult Health

## 2019-09-11 ENCOUNTER — Non-Acute Institutional Stay (SKILLED_NURSING_FACILITY): Payer: Medicare HMO | Admitting: Adult Health

## 2019-09-11 ENCOUNTER — Ambulatory Visit: Payer: Medicare HMO | Admitting: Family Medicine

## 2019-09-11 DIAGNOSIS — K5909 Other constipation: Secondary | ICD-10-CM

## 2019-09-11 DIAGNOSIS — E785 Hyperlipidemia, unspecified: Secondary | ICD-10-CM | POA: Diagnosis not present

## 2019-09-11 NOTE — Progress Notes (Signed)
Location:    [place] Room Number: 149/D Place of Service:  SNF (31)   CODE STATUS: Full Code  Allergies  Allergen Reactions  . Cellcept [Mycophenolate Mofetil]     Muscle cramps, mouth soreness, difficulty swallowing, bruising  . Statins     Dx of in  . Ciprofloxacin Hcl Other (See Comments)    Chills, sick, could not tolerate it  . Hydrocodone Nausea Only  . Penicillins Rash    Chief Complaint  Patient presents with  . Medical Management of Chronic Issues        Slow transit constipation:   Hyperlipidemia LDL goal <100  Hypomagnesemia:    HPI:  She is a 86 year old short term rehab patient being seen for the management of her chronic illnesses: constipation hyperlipidemia; magnesium. There are no reports of uncontrolled pain; she continues to have lower extremity edema. There are no reports of agitation or anxiety. She has not been discharged to home yet.   Past Medical History:  Diagnosis Date  . Arthritis    spinal stenosis  . Elevated LFTs    secondary to fatty liver, negative work-up in 2011  . Hypercholesterolemia   . Hypertension   . Lumbar spinal stenosis 01/14/2018   L3-4 and L4-5  . Myopathy 02/15/2018  . Osteoarthritis    right knee  . Peripheral neuropathy 07/06/2018  . PONV (postoperative nausea and vomiting)   . Uterine cancer ([place]) 08/2006   grade 1, no recurrence up to 2013    Past Surgical History:  Procedure Laterality Date  . 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 APPENDECTOMY  1973  . BREAST BIOPSY Left 2018   Benign  . CHOLECYSTECTOMY  1973  . COLONOSCOPY    06/21/2007   UTM:LYYTKP rectum/Sigmoid diverticula, diminutive hepatic flexure polyp s/p bx. Benign.   . COLONOSCOPY N/A 10/17/2012   Dr. Oneida Alar- moderate diverticulosis was noted in the sigmoid colon, moderate sized internal hemorrhoids. next tcs in10 years  . ESOPHAGOGASTRODUODENOSCOPY N/A 03/20/2016   Dr. Oneida Alar, widely patent Schatzki  ring, anemia due to ASA induced erosive gastritis, mild duodenitis. gastric bx benign without H.pylori.   Marland Kitchen FOOT SURGERY  2007   Pins in toes on left foot, 5 to 6 yrs ago  . GIVENS CAPSULE STUDY N/A 03/20/2016   Procedure: GIVENS CAPSULE STUDY;  Surgeon: Danie Binder, MD;  [facility];  Service: Endoscopy;  Laterality: N/A;  . MUSCLE BIOPSY Left 06/07/2018   Procedure: LEFT THIGH MUSCLE BIOPSY;  Surgeon: Erroll Luna, MD;  Location: [place] [place];  Service: General;  Laterality: Left;  . TONSILLECTOMY      Social History   Socioeconomic History  . Marital status: Married    Spouse name: Jeneen Rinks   . Number of children: 3  . Years of education: 19  . Highest education level: 12th grade  Occupational History  . Occupation: retired   Tobacco Use  . Smoking status: Never Smoker  . Smokeless tobacco: Never Used  . Tobacco comment: Never smoked  Vaping Use  . Vaping Use: Never used  Substance and Sexual Activity  . Alcohol use: No  . Drug use: No  . Sexual activity: Not Currently    Birth control/protection: Post-menopausal, Surgical  Other Topics Concern  . Not on file  Social History Narrative   Husband Jeneen Rinks recently passed away   Caffeine use: soda daily   Right handed    Social Determinants  of Health   Financial Resource Strain:   . Difficulty of Paying Living Expenses:   Food Insecurity:   . Worried About Charity fundraiser in the Last Year:   . Arboriculturist in the Last Year:   Transportation Needs:   . Film/video editor (Medical):   Marland Kitchen Lack of Transportation (Non-Medical):   Physical Activity:   . Days of Exercise per Week:   . Minutes of Exercise per Session:   Stress:   . Feeling of Stress :   Social Connections: Unknown  . Frequency of Communication with Friends and Family: Not on file  . Frequency of Social Gatherings with Friends and Family: Not on file  . Attends Religious Services: Not on file  . Active Member of Clubs  or Organizations: No  . Attends Archivist Meetings: Not on file  . Marital Status: Widowed  Intimate Partner Violence:   . Fear of Current or Ex-Partner:   . Emotionally Abused:   Marland Kitchen Physically Abused:   . Sexually Abused:    Family History  Problem Relation Age of Onset  . Heart disease Father   . Bladder Cancer Brother        in remission   . Hypertension Sister   . Dementia Sister   . Diabetes type II Sister   . Hypertension Sister   . Aneurysm Sister   . Hypertension Brother   . Arthritis Brother   . Hypertension Son   . Colon cancer Neg Hx       VITAL SIGNS BP (!) 151/88   Pulse 70   Temp (!) 96.8 F (36 C) (Oral)   Resp 20   Ht 5' 4"  (1.626 m)   Wt 159 lb 3.2 oz (72.2 kg)   BMI 27.33 kg/m   Outpatient Encounter Medications as of 09/11/2019  Medication Sig  . acetaminophen (TYLENOL) 650 MG CR tablet Take 650 mg by mouth every 6 (six) hours.   . diclofenac Sodium (VOLTAREN) 1 % GEL Apply 2 g topically every 6 (six) hours as needed.  . ezetimibe (ZETIA) 10 MG tablet Take 1 tablet (10 mg total) by mouth daily.  . folic acid (FOLVITE) 1 MG tablet Take 1 tablet (1 mg total) by mouth daily.  . furosemide (LASIX) 40 MG tablet Take 1 tablet (40 mg total) by mouth daily.  Marland Kitchen gabapentin (NEURONTIN) 100 MG capsule Take 1 capsule (100 mg total) by mouth at bedtime.  Marland Kitchen LINZESS 145 MCG CAPS capsule Take 1 capsule (145 mcg total) by mouth daily as needed.  . magnesium oxide (MAG-OX) 400 (241.3 Mg) MG tablet Take 1 tablet (400 mg total) by mouth daily.  . NON FORMULARY Fluid restriction of 1.8 liters per day (1718m total) 7-3 = 800 ml 3-11 = 800 ml 11-7 = 175 ml Document totals qshift. Every Shift Day, Evening, Nigh  . NON FORMULARY Diet: _____ Regular, __x____ NAS, _______Consistent Carbohydrate, _______NPO _____Other  . pantoprazole (PROTONIX) 40 MG tablet Take 1 tablet (40 mg total) by mouth daily.  . potassium chloride SA (KLOR-CON) 20 MEQ tablet Take 20  mEq by mouth daily.  . [DISCONTINUED] apixaban (ELIQUIS) 5 MG TABS tablet Take 1 tablet (5 mg total) by mouth 2 (two) times daily.  . [DISCONTINUED] ergocalciferol (VITAMIN D2) 1.25 MG (50000 UT) capsule Take 1 capsule (50,000 Units total) by mouth once a week. One capsule once weekly  Started takinjg in January 2021, will take for 6 months only and re check)  . [  DISCONTINUED] Potassium Chloride ER 20 MEQ TBCR Take 20 mEq by mouth every Tuesday, Thursday, Saturday, and Sunday. Take only while on Lasix   No facility-administered encounter medications on file as of 09/11/2019.     SIGNIFICANT DIAGNOSTIC EXAMS   PREVIOUS  08-04-19: chest x-ray: No active disease.   08-05-19: bilateral lower extremity venous doppler:  1. 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 2. No evidence of acute or chronic DVT within the right lower extremity.  NO NEW EXAMS     LABS REVIEWED PREVIOUS  08-04-19: wbc 6.6; hgb 8.9; hct 29.2; mcv 97.7 plt 228; glucose 125; bun 33; creat 0.58 k+ 3.2; na++ 142 ca 8.9 liver normal albumin 3.2 mag 1.9  08-07-19: wbc 6.7; hgb 7.8; hct 25.6; mcv 98.5 plt 226; glucose 121; bun 31; creat 0.73 ;k+ 4.2; na++ 142; ca 8.8  08-10-19: wbc 5.4; hgb 7.4; hct 24.4; mcv 97.6 plt 271;  08-14-19 hgb 7.7; hct 26.0; glucose 92; bun 38; creat 0.82; k+ 4.0; na++ 142; ca 9.1  08-22-19: urine culture: proteus mirabilis: rocephin 08-23-19: wbc 6.6; hgb 8.6; hct 29.3; mcv 99.0 plt 340; glucose 132; bun 40; creat 0.93; k+ 3.3; na++ 144; ca 9.6 liver normal albumin 3.2 vit B 12: 319; foalte 102; ferritin 142; iron 48; tibc 298  NO NEW LABS.     Review of Systems  Constitutional: Negative for malaise/fatigue.  Respiratory:  Negative for cough and shortness of breath.   Cardiovascular: Positive for leg swelling. Negative for chest pain and palpitations.  Gastrointestinal: Negative for abdominal pain, constipation and heartburn.  Musculoskeletal: Negative for back pain, joint pain and myalgias.  Skin: Negative.   Neurological: Negative for dizziness.  Psychiatric/Behavioral: The patient is not nervous/anxious.    Physical Exam Constitutional:      General: She is not in acute distress.    Appearance: She is well-developed. She is not diaphoretic.  Neck:     Thyroid: No thyromegaly.  Cardiovascular:     Rate and Rhythm: Normal rate and regular rhythm.     Pulses: Normal pulses.     Heart sounds: Normal heart sounds.  Pulmonary:     Effort: Pulmonary effort is normal. No respiratory distress.     Breath sounds: Normal breath sounds.  Abdominal:     General: Bowel sounds are normal. There is no distension.     Palpations: Abdomen is soft.     Tenderness: There is no abdominal tenderness.  Musculoskeletal:        General: Normal range of motion.     Cervical back: Neck supple.     Right lower leg: Edema present.     Left lower leg: Edema present.  Lymphadenopathy:     Cervical: No cervical adenopathy.  Skin:    General: Skin is warm and dry.  Neurological:     Mental Status: She is alert and oriented to person, place, and time.  Psychiatric:        Mood and Affect: Mood normal.     ASSESSMENT/ PLAN:  TODAY  1. Slow transit constipation: is stable will continue linzess 145 mcg daily as needed  2. Hyperlipidemia LDL goal <100 will continue zeta 10 mg daily   3. Hypomagnesemia: is stable will continue  mag ox 400 mg daily    PREVIOUS  4. Vitamin D deficiency: is stable will monitor   5. Chronic anemia: without change hgb 7.7 will monitor   6. Bilateral lower extremity edema: is stable will continue lasix 40 mg daily with k+ 10 meq 4 days per week 1800 cc fluid restriction   7. Chronic  deep vein thrombosis of popliteal vein of left lower extremity: is stable is on chronic eliquis 5 mg twice daily   8. Gastroesophageal reflux disease without esophagitis: is stable will continue protonix 40 mg daily  9. NASH is stable will monitor   10. Idiopathic peripheral neuropathy: is stable will continue gabapentin 100 mg nightly   11. Hemarthrosis of left knee: no changes in status; is status post arthrocentesis    MD is aware of resident's narcotic use and is in agreement with current plan of care. We will attempt to wean resident as appropriate.  Ok Edwards NP [facility]  Contact [phone] Monday through Friday 8am- 5pm  After hours call [phone]

## 2019-09-12 ENCOUNTER — Other Ambulatory Visit (HOSPITAL_COMMUNITY): Payer: Medicare HMO

## 2019-09-12 ENCOUNTER — Ambulatory Visit (HOSPITAL_COMMUNITY): Payer: Medicare HMO

## 2019-09-18 ENCOUNTER — Non-Acute Institutional Stay (SKILLED_NURSING_FACILITY): Payer: Medicare HMO | Admitting: Adult Health

## 2019-09-18 ENCOUNTER — Ambulatory Visit (HOSPITAL_COMMUNITY)
Admission: RE | Admit: 2019-09-18 | Discharge: 2019-09-18 | Disposition: A | Discharge status: Home / Self Care | Payer: Medicare HMO | Source: Ambulatory Visit | Attending: Hematology | Admitting: Hematology

## 2019-09-18 ENCOUNTER — Encounter: Payer: Self-pay | Admitting: Adult Health

## 2019-09-18 DIAGNOSIS — I82532 Chronic embolism and thrombosis of left popliteal vein: Secondary | ICD-10-CM

## 2019-09-18 DIAGNOSIS — K219 Gastro-esophageal reflux disease without esophagitis: Secondary | ICD-10-CM

## 2019-09-18 DIAGNOSIS — I10 Essential (primary) hypertension: Secondary | ICD-10-CM | POA: Diagnosis not present

## 2019-09-18 DIAGNOSIS — Z86718 Personal history of other venous thrombosis and embolism: Secondary | ICD-10-CM | POA: Diagnosis not present

## 2019-09-18 NOTE — Progress Notes (Signed)
Location:    [place] Room Number: 149/D Place of Service:  SNF (31)   CODE STATUS: Full Code  Allergies  Allergen Reactions  . Cellcept [Mycophenolate Mofetil]     Muscle cramps, mouth soreness, difficulty swallowing, bruising  . Statins     Dx of in  . Ciprofloxacin Hcl Other (See Comments)    Chills, sick, could not tolerate it  . Hydrocodone Nausea Only  . Penicillins Rash    Chief Complaint  Patient presents with  . Acute Visit    Care Plan Meeting    HPI:  We have come together for her care plan meeting to discuss her discharge planning. Family present. She is wanting to go home. She requires max assist to stand; is able to ambulate up to 40 feet once she is up. She is independent with her upper body adls but requires max assist for lower body. She has 2 steps to get into her house; has a ramp ready. Her family is looking to into getting her private duty assistance at home. She states that she will be able to do better once she is at home. She is willing to wait until the end of the week to go home. She will need a wheelchair and will need home health as well. She continues to be followed for her chronic illnesses including:  Essential hypertension  Chronic deep vein thrombosis (DVT) of popliteal vein of left lower extremity  Gastroesophageal reflux disease without esophagitis.   Past Medical History:  Diagnosis Date  . Arthritis    spinal stenosis  . Elevated LFTs    secondary to fatty liver, negative work-up in 2011  . Hypercholesterolemia   . Hypertension   . Lumbar spinal stenosis 01/14/2018   L3-4 and L4-5  . Myopathy 02/15/2018  . Osteoarthritis    right knee  . Peripheral neuropathy 07/06/2018  . PONV (postoperative nausea and vomiting)   . Uterine cancer ([place]) 08/2006   grade 1, no recurrence up to 2013    Past Surgical History:  Procedure Laterality Date  . 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 APPENDECTOMY   1973  . BREAST BIOPSY Left 2018   Benign  . CHOLECYSTECTOMY  1973  . COLONOSCOPY    06/21/2007   MEQ:ASTMHD rectum/Sigmoid diverticula, diminutive hepatic flexure polyp s/p bx. Benign.   . COLONOSCOPY N/A 10/17/2012   Dr. Oneida Alar- moderate diverticulosis was noted in the sigmoid colon, moderate sized internal hemorrhoids. next tcs in10 years  . ESOPHAGOGASTRODUODENOSCOPY N/A 03/20/2016   Dr. Oneida Alar, widely patent Schatzki ring, anemia due to ASA induced erosive gastritis, mild duodenitis. gastric bx benign without H.pylori.   Marland Kitchen FOOT SURGERY  2007   Pins in toes on left foot, 5 to 6 yrs ago  . GIVENS CAPSULE STUDY N/A 03/20/2016   Procedure: GIVENS CAPSULE STUDY;  Surgeon: Danie Binder, MD;  [facility];  Service: Endoscopy;  Laterality: N/A;  . MUSCLE BIOPSY Left 06/07/2018   Procedure: LEFT THIGH MUSCLE BIOPSY;  Surgeon: Erroll Luna, MD;  Location: [place];  Service: General;  Laterality: Left;  . TONSILLECTOMY      Social History   Socioeconomic History  . Marital status: Married    Spouse name: Jeneen Rinks   . Number of children: 3  . Years of education: 65  . Highest education level: 12th grade  Occupational History  . Occupation: retired   Tobacco Use  . Smoking  status: Never Smoker  . Smokeless tobacco: Never Used  . Tobacco comment: Never smoked  Vaping Use  . Vaping Use: Never used  Substance and Sexual Activity  . Alcohol use: No  . Drug use: No  . Sexual activity: Not Currently    Birth control/protection: Post-menopausal, Surgical  Other Topics Concern  . Not on file  Social History Narrative   Husband Jeneen Rinks recently passed away   Caffeine use: soda daily   Right handed    Social Determinants of Health   Financial Resource Strain:   . Difficulty of Paying Living Expenses:   Food Insecurity:   . Worried About Charity fundraiser in the Last Year:   . Arboriculturist in the Last Year:   Transportation Needs:   . Lexicographer (Medical):   Marland Kitchen Lack of Transportation (Non-Medical):   Physical Activity:   . Days of Exercise per Week:   . Minutes of Exercise per Session:   Stress:   . Feeling of Stress :   Social Connections: Unknown  . Frequency of Communication with Friends and Family: Not on file  . Frequency of Social Gatherings with Friends and Family: Not on file  . Attends Religious Services: Not on file  . Active Member of Clubs or Organizations: No  . Attends Archivist Meetings: Not on file  . Marital Status: Widowed  Intimate Partner Violence:   . Fear of Current or Ex-Partner:   . Emotionally Abused:   Marland Kitchen Physically Abused:   . Sexually Abused:    Family History  Problem Relation Age of Onset  . Heart disease Father   . Bladder Cancer Brother        in remission   . Hypertension Sister   . Dementia Sister   . Diabetes type II Sister   . Hypertension Sister   . Aneurysm Sister   . Hypertension Brother   . Arthritis Brother   . Hypertension Son   . Colon cancer Neg Hx       VITAL SIGNS BP 140/68   Pulse 70   Temp 98.2 F (36.8 C) (Oral)   Resp 20   Ht 5' 4"  (1.626 m)   Wt 154 lb 6.4 oz (70 kg)   BMI 26.50 kg/m   Outpatient Encounter Medications as of 09/18/2019  Medication Sig  . acetaminophen (TYLENOL) 650 MG CR tablet Take 650 mg by mouth every 6 (six) hours.   . diclofenac Sodium (VOLTAREN) 1 % GEL Apply 2 g topically every 6 (six) hours as needed.  . ezetimibe (ZETIA) 10 MG tablet Take 1 tablet (10 mg total) by mouth daily.  . folic acid (FOLVITE) 1 MG tablet Take 1 tablet (1 mg total) by mouth daily.  . furosemide (LASIX) 40 MG tablet Take 1 tablet (40 mg total) by mouth daily.  Marland Kitchen gabapentin (NEURONTIN) 100 MG capsule Take 1 capsule (100 mg total) by mouth at bedtime.  Marland Kitchen LINZESS 145 MCG CAPS capsule Take 1 capsule (145 mcg total) by mouth daily as needed.  . magnesium oxide (MAG-OX) 400 (241.3 Mg) MG tablet Take 1 tablet (400 mg total) by mouth  daily.  . NON FORMULARY Fluid restriction of 1.8 liters per day (1769m total) 7-3 = 800 ml 3-11 = 800 ml 11-7 = 175 ml Document totals qshift. Every Shift Day, Evening, Nigh  . NON FORMULARY Diet: _____ Regular, __x____ NAS, _______Consistent Carbohydrate, _______NPO _____Other  . pantoprazole (PROTONIX) 40 MG tablet Take  1 tablet (40 mg total) by mouth daily.  . potassium chloride SA (KLOR-CON) 20 MEQ tablet Take 20 mEq by mouth daily.   No facility-administered encounter medications on file as of 09/18/2019.     SIGNIFICANT DIAGNOSTIC EXAMS   PREVIOUS  08-04-19: chest x-ray: No active disease.   08-05-19: bilateral lower extremity venous doppler:  1. 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 2. No evidence of acute or chronic DVT within the right lower extremity.  NO NEW EXAMS     LABS REVIEWED PREVIOUS  08-04-19: wbc 6.6; hgb 8.9; hct 29.2; mcv 97.7 plt 228; glucose 125; bun 33; creat 0.58 k+ 3.2; na++ 142 ca 8.9 liver normal albumin 3.2 mag 1.9  08-07-19: wbc 6.7; hgb 7.8; hct 25.6; mcv 98.5 plt 226; glucose 121; bun 31; creat 0.73 ;k+ 4.2; na++ 142; ca 8.8  08-10-19: wbc 5.4; hgb 7.4; hct 24.4; mcv 97.6 plt 271;  08-14-19 hgb 7.7; hct 26.0; glucose 92; bun 38; creat 0.82; k+ 4.0; na++ 142; ca 9.1  08-22-19: urine culture: proteus mirabilis: rocephin 08-23-19: wbc 6.6; hgb 8.6; hct 29.3; mcv 99.0 plt 340; glucose 132; bun 40; creat 0.93; k+ 3.3; na++ 144; ca 9.6 liver normal albumin 3.2 vit B 12: 319; foalte 102; ferritin 142; iron 48; tibc 298  NO NEW LABS.    Review of Systems  Constitutional: Negative for malaise/fatigue.  Respiratory: Negative for cough and shortness of breath.     Cardiovascular: Negative for chest pain, palpitations and leg swelling.  Gastrointestinal: Negative for abdominal pain, constipation and heartburn.  Musculoskeletal: Negative for back pain, joint pain and myalgias.  Skin: Negative.   Neurological: Negative for dizziness.  Psychiatric/Behavioral: The patient is not nervous/anxious.    Physical Exam Constitutional:      General: She is not in acute distress.    Appearance: She is well-developed. She is not diaphoretic.  Neck:     Thyroid: No thyromegaly.  Cardiovascular:     Rate and Rhythm: Normal rate and regular rhythm.     Pulses: Normal pulses.     Heart sounds: Normal heart sounds.  Pulmonary:     Effort: Pulmonary effort is normal. No respiratory distress.     Breath sounds: Normal breath sounds.  Abdominal:     General: Bowel sounds are normal. There is no distension.     Palpations: Abdomen is soft.     Tenderness: There is no abdominal tenderness.  Musculoskeletal:        General: Normal range of motion.     Cervical back: Neck supple.     Right lower leg: Edema present.     Left lower leg: Edema present.  Lymphadenopathy:     Cervical: No cervical adenopathy.  Skin:    General: Skin is warm and dry.  Neurological:     Mental Status: She is alert and oriented to person, place, and time.  Psychiatric:        Mood and Affect: Mood normal.       ASSESSMENT/ PLAN:  TODAY   1. Essential hypertension 2. Chronic deep vein thrombosis (DVT) of popliteal vein of left lower extremity  3. Gastroesophageal reflux disease without esophagitis.   Will continue therapy as directed Will continue current  medications Will continue to monitor her status.  Her goal is to go home by the end of this week.   MD is aware of resident's narcotic use and is in agreement with current plan of care. We will attempt to wean resident as appropriate.  Ok Edwards NP [facility]  Contact [phone] Monday through  Friday 8am- 5pm  After hours call [phone]

## 2019-09-19 ENCOUNTER — Inpatient Hospital Stay (HOSPITAL_BASED_OUTPATIENT_CLINIC_OR_DEPARTMENT_OTHER): Payer: Medicare HMO | Admitting: Oncology

## 2019-09-19 ENCOUNTER — Ambulatory Visit (HOSPITAL_COMMUNITY): Payer: Medicare HMO

## 2019-09-19 ENCOUNTER — Ambulatory Visit (HOSPITAL_COMMUNITY): Payer: Medicare HMO | Admitting: Oncology

## 2019-09-19 VITALS — BP 130/56 | HR 83 | Temp 97.1°F | Resp 18

## 2019-09-19 DIAGNOSIS — Z86718 Personal history of other venous thrombosis and embolism: Secondary | ICD-10-CM | POA: Diagnosis not present

## 2019-09-19 DIAGNOSIS — M25062 Hemarthrosis, left knee: Secondary | ICD-10-CM

## 2019-09-19 DIAGNOSIS — I82412 Acute embolism and thrombosis of left femoral vein: Secondary | ICD-10-CM

## 2019-09-19 DIAGNOSIS — Z8542 Personal history of malignant neoplasm of other parts of uterus: Secondary | ICD-10-CM | POA: Diagnosis not present

## 2019-09-19 DIAGNOSIS — Z90710 Acquired absence of both cervix and uterus: Secondary | ICD-10-CM | POA: Diagnosis not present

## 2019-09-19 DIAGNOSIS — D649 Anemia, unspecified: Secondary | ICD-10-CM

## 2019-09-19 DIAGNOSIS — Z7901 Long term (current) use of anticoagulants: Secondary | ICD-10-CM | POA: Diagnosis not present

## 2019-09-19 NOTE — Patient Instructions (Signed)
[place] at [facility] Discharge Instructions  You were seen today by Kirby Crigler PA. He went over your recent test results. He will see you back in 4 weeks for labs and follow up.   Thank you for choosing [place] at [facility] to provide your oncology and hematology care.  To afford each patient quality time with our provider, please arrive at least 15 minutes before your scheduled appointment time.   If you have a lab appointment with the [facility] please come in thru the  Main Entrance and check in at the main information desk  You need to re-schedule your appointment should you arrive 10 or more minutes late.  We strive to give you quality time with our providers, and arriving late affects you and other patients whose appointments are after yours.  Also, if you no show three or more times for appointments you may be dismissed from the clinic at the providers discretion.     Again, thank you for choosing [facility].  Our hope is that these requests will decrease the amount of time that you wait before being seen by our physicians.       _____________________________________________________________  Should you have questions after your visit to [facility], please contact our office at [phone] between the hours of 8:00 a.m. and 4:30 p.m.  Voicemails left after 4:00 p.m. will not be returned until the following business day.  For prescription refill requests, have your pharmacy contact our office and allow 72 hours.    Cancer Center Support Programs:   > Cancer Support Group  2nd Tuesday of the month 1pm-2pm, Journey Room

## 2019-09-19 NOTE — Progress Notes (Signed)
[name] Helper, MD [address] [place] [address]  Acute deep vein thrombosis (DVT) of left femoral vein ([place]) - Plan: CBC with Differential, Comprehensive metabolic panel, Iron and TIBC, Ferritin, Vitamin B12, Folate, Methylmalonic acid, serum  Hemarthrosis involving knee joint, left  Normocytic anemia - Plan: CBC with Differential, Comprehensive metabolic panel, Iron and TIBC, Ferritin, Vitamin B12, Folate, Methylmalonic acid, serum   HISTORY OF PRESENT ILLNESS: Acute deep vein thrombosis (DVT) of left femoral vein Weakly provoked left leg DVT: -Status post fall prior to presentation to the ER on 02/05/2019 with left leg swelling. Doppler showed occlusive DVT extending from proximal left femoral vein through left popliteal vein. -He was started on Eliquis since then. Eliquis was discontinued around 06/06/2019. D-dimer was 1.42 on same day. -I have recommended restarting Eliquis on 06/13/2019. She has been taking since then. -Ultrasound of the leg on 08/05/2019 showed occlusive DVT involving left popliteal vein with extension to left posterior tibial vein and one of the paired left peroneal veins similar to 01/2019 examination. -Ultrasound on 08/23/2019 showed no evidence of acute or chronic DVT within the either lower extremity with special attention paid to the left popliteal and tibial veins. There is a 16.6 x 3.9 x 3.5 cm complex fluid collection extending from the left popliteal fossa to the proximal/mid aspect of the left calf, not seen on 08/05/2019. - Discontinued Eliquis anticoagulation with Eliquis on 09/05/2019. - Bilateral lower extremity US on 09/18/2019 negative for DVT. AND Hemarthrosis of left knee, managed by orthopod, Dr. Aline Brochure. AND Normocytic anemia, likely multifactorial with anemia of chronic disease being the largest participant.  CURRENT STATUS: [name] [name] 86 y.o. female returns for followup of history of DVT of left lower  extremity treated with anticoagulation therapy with resolution on Doppler study and repeat Doppler study on 09/18/2019 confirms negative study for thrombus.  She is doing well overall.  She no longer is on a blood thinner and she is happy about that.  She denies blood in her stools or black stools.  No hemoptysis or gross hematuria.  She denies any new cough or shortness of breath.  No chest pain.  She denies unilateral lower extremity edema, erythema, pain, claudication, and heat.  She underwent treatment for her left knee hemarthrosis with Dr. Aline Brochure and this is much improved symptomatically.  She currently is at the [facility] and is working with physical therapy occupational therapy.  She reports that she is walking approximately 100 feet per day.  She is hopeful to be discharged in the near future with home health.  Review of Systems  Constitutional: Negative.  Negative for chills, fever and weight loss.  HENT: Negative.   Eyes: Negative.   Respiratory: Negative.  Negative for cough.   Cardiovascular: Negative.  Negative for chest pain.  Gastrointestinal: Negative.  Negative for blood in stool, constipation, diarrhea, melena, nausea and vomiting.  Genitourinary: Negative.   Musculoskeletal: Negative.   Skin: Negative.   Neurological: Negative.  Negative for weakness.  Endo/Heme/Allergies: Negative.   Psychiatric/Behavioral: Negative.     Past Medical History:  Diagnosis Date  . Arthritis    spinal stenosis  . Elevated LFTs    secondary to fatty liver, negative work-up in 2011  . Hypercholesterolemia   . Hypertension   . Lumbar spinal stenosis 01/14/2018   L3-4 and L4-5  . Myopathy 02/15/2018  . Osteoarthritis    right knee  . Peripheral neuropathy 07/06/2018  .  PONV (postoperative nausea and vomiting)   . Uterine cancer ([place]) 08/2006   grade 1, no recurrence up to 2013     PHYSICAL EXAMINATION  ECOG PERFORMANCE STATUS: 2 - Symptomatic, <50% confined to bed  Vitals:    09/19/19 1122  BP: (!) 130/56  Pulse: 83  Resp: 18  Temp: (!) 97.1 F (36.2 C)  SpO2: 98%    GENERAL:alert, no distress, well nourished, well developed, comfortable, cooperative, smiling and accompanied by son, in wheelchair. SKIN: skin color, texture, turgor are normal, no rashes or significant lesions HEAD: Normocephalic, No masses, lesions, tenderness or abnormalities EYES: normal, EOMI, Conjunctiva are pink and non-injected EARS: External ears normal OROPHARYNX: Not examined, mask in place. NECK: supple, no adenopathy LYMPH:  no palpable lymphadenopathy BREAST:not examined LUNGS: clear to auscultation  HEART: regular rate & rhythm ABDOMEN:abdomen soft, non-tender and normal bowel sounds BACK: Back symmetric, no curvature. EXTREMITIES:positive findings:  edema bilateral lower extremity edema with compression stockings on NEURO: alert & oriented x 3 with fluent speech, in wheelchair  LABORATORY DATA: CBC    Component Value Date/Time   WBC 3.9 (L) 09/07/2019 0500   RBC 3.00 (L) 09/07/2019 0500   HGB 8.5 (L) 09/07/2019 0500   HGB 11.6 10/24/2018 1520   HCT 28.8 (L) 09/07/2019 0500   HCT 34.1 10/24/2018 1520   PLT 272 09/07/2019 0500   PLT 166 10/24/2018 1520   MCV 96.0 09/07/2019 0500   MCV 88 10/24/2018 1520   MCH 28.3 09/07/2019 0500   MCHC 29.5 (L) 09/07/2019 0500   RDW 14.0 09/07/2019 0500   RDW 13.2 10/24/2018 1520   LYMPHSABS 1.0 09/07/2019 0500   LYMPHSABS 0.7 10/24/2018 1520   MONOABS 0.5 09/07/2019 0500   EOSABS 0.1 09/07/2019 0500   EOSABS 0.1 10/24/2018 1520   BASOSABS 0.0 09/07/2019 0500   BASOSABS 0.0 10/24/2018 1520      Chemistry      Component Value Date/Time   NA 144 09/07/2019 0500   NA 142 10/24/2018 1520   K 3.4 (L) 09/07/2019 0500   CL 103 09/07/2019 0500   CO2 30 09/07/2019 0500   BUN 32 (H) 09/07/2019 0500   BUN 32 (H) 10/24/2018 1520   CREATININE 0.86 09/07/2019 0500   CREATININE 0.82 01/04/2019 1317      Component Value  Date/Time   CALCIUM 9.0 09/07/2019 0500   ALKPHOS 57 09/07/2019 0500   AST 50 (H) 09/07/2019 0500   ALT 36 09/07/2019 0500   BILITOT 0.5 09/07/2019 0500   BILITOT 0.6 10/24/2018 1520       RADIOGRAPHIC STUDIES:  US Venous Img Lower Bilateral (DVT)  Result Date: 09/18/2019 CLINICAL DATA:  86 year old female with a history of possible chronic DVT EXAM: BILATERAL LOWER EXTREMITY VENOUS DOPPLER ULTRASOUND TECHNIQUE: Gray-scale sonography with graded compression, as well as color Doppler and duplex ultrasound were performed to evaluate the lower extremity deep venous systems from the level of the common femoral vein and including the common femoral, femoral, profunda femoral, popliteal and calf veins including the posterior tibial, peroneal and gastrocnemius veins when visible. The superficial great saphenous vein was also interrogated. Spectral Doppler was utilized to evaluate flow at rest and with distal augmentation maneuvers in the common femoral, femoral and popliteal veins. COMPARISON:  None. FINDINGS: RIGHT LOWER EXTREMITY Common Femoral Vein: No evidence of thrombus. Normal compressibility, respiratory phasicity and response to augmentation. Saphenofemoral Junction: No evidence of thrombus. Normal compressibility and flow on color Doppler imaging. Profunda Femoral Vein: No evidence of  thrombus. Normal compressibility and flow on color Doppler imaging. Femoral Vein: No evidence of thrombus. Normal compressibility, respiratory phasicity and response to augmentation. Popliteal Vein: No evidence of thrombus. Normal compressibility, respiratory phasicity and response to augmentation. Calf Veins: No evidence of thrombus. Normal compressibility and flow on color Doppler imaging. Superficial Great Saphenous Vein: No evidence of thrombus. Normal compressibility and flow on color Doppler imaging. Other Findings:  None. LEFT LOWER EXTREMITY Common Femoral Vein: No evidence of thrombus. Normal compressibility,  respiratory phasicity and response to augmentation. Saphenofemoral Junction: No evidence of thrombus. Normal compressibility and flow on color Doppler imaging. Profunda Femoral Vein: No evidence of thrombus. Normal compressibility and flow on color Doppler imaging. Femoral Vein: No evidence of thrombus. Normal compressibility, respiratory phasicity and response to augmentation. Popliteal Vein: No evidence of thrombus. Normal compressibility, respiratory phasicity and response to augmentation. Calf Veins: No evidence of thrombus. Normal compressibility and flow on color Doppler imaging. Superficial Great Saphenous Vein: No evidence of thrombus. Normal compressibility and flow on color Doppler imaging. Other Findings: Anechoic fluid collection within the superficial soft tissues of the medial left calf measures 11.2 cm x 4.2 cm x 3.0 cm IMPRESSION: Sonographic survey of the bilateral lower extremities negative for DVT Nonspecific fluid collection within the medial left calf measures as large as 11.2 cm. Correlation with any history of trauma may be useful. Electronically Signed   By: Corrie Mckusick D.O.   On: 09/18/2019 12:45   US Venous Img Lower Bilateral  Result Date: 08/23/2019 CLINICAL DATA:  History of previous DVT now with lower extremity edema. Patient remains on anticoagulation. Evaluate for acute or chronic DVT. EXAM: BILATERAL LOWER EXTREMITY VENOUS DOPPLER ULTRASOUND TECHNIQUE: Gray-scale sonography with graded compression, as well as color Doppler and duplex ultrasound were performed to evaluate the lower extremity deep venous systems from the level of the common femoral vein and including the common femoral, femoral, profunda femoral, popliteal and calf veins including the posterior tibial, peroneal and gastrocnemius veins when visible. The superficial great saphenous vein was also interrogated. Spectral Doppler was utilized to evaluate flow at rest and with distal augmentation maneuvers in the common  femoral, femoral and popliteal veins. COMPARISON:  Bilateral lower extremity venous Doppler ultrasound-08/05/2019 (occlusive DVT involving the left popliteal vein extending to involve the left posterior tibial and one of the paired peroneal veins). FINDINGS: RIGHT LOWER EXTREMITY Common Femoral Vein: No evidence of thrombus. Normal compressibility, respiratory phasicity and response to augmentation. Saphenofemoral Junction: No evidence of thrombus. Normal compressibility and flow on color Doppler imaging. Profunda Femoral Vein: No evidence of thrombus. Normal compressibility and flow on color Doppler imaging. Femoral Vein: No evidence of thrombus. Normal compressibility, respiratory phasicity and response to augmentation. Popliteal Vein: No evidence of thrombus. Normal compressibility, respiratory phasicity and response to augmentation. Calf Veins: No evidence of thrombus. Normal compressibility and flow on color Doppler imaging. Superficial Great Saphenous Vein: No evidence of thrombus. Normal compressibility. Venous Reflux:  None. Other Findings:  None. LEFT LOWER EXTREMITY Common Femoral Vein: No evidence of thrombus. Normal compressibility, respiratory phasicity and response to augmentation. Saphenofemoral Junction: No evidence of thrombus. Normal compressibility and flow on color Doppler imaging. Profunda Femoral Vein: No evidence of thrombus. Normal compressibility and flow on color Doppler imaging. Femoral Vein: No evidence of thrombus. Normal compressibility, respiratory phasicity and response to augmentation. Popliteal Vein: No evidence of acute or chronic thrombus. Normal compressibility, respiratory phasicity and response to augmentation. Calf Veins: Appear patent where imaged. Superficial Great Saphenous Vein: No  evidence of thrombus. Normal compressibility. Venous Reflux:  None. Other Findings:  None. IMPRESSION: 1. No evidence of acute or chronic DVT within either lower extremity with special attention  paid to the left popliteal and tibial veins. 2. Note made of an approximate 16.6 x 3.9 x 3.5 cm complex fluid collection extending from the left popliteal fossa to the proximal/mid aspect the left calf, not seen on the 08/05/2019 examination - differential considerations include a hematoma (favored), leaking Baker's cyst or (less likely) abscess or seroma. Electronically Signed   By: Sandi Mariscal M.D.   On: 08/23/2019 13:38       ASSESSMENT AND PLAN:  1. Acute deep vein thrombosis (DVT) of left femoral vein ([facility]) Weakly provoked left leg DVT: -Status post fall prior to presentation to the ER on 02/05/2019 with left leg swelling. Doppler showed occlusive DVT extending from proximal left femoral vein through left popliteal vein. -He was started on Eliquis since then. Eliquis was discontinued around 06/06/2019. D-dimer was 1.42 on same day. -I have recommended restarting Eliquis on 06/13/2019. She has been taking since then. -Ultrasound of the leg on 08/05/2019 showed occlusive DVT involving left popliteal vein with extension to left posterior tibial vein and one of the paired left peroneal veins similar to 01/2019 examination. -Ultrasound on 08/23/2019 showed no evidence of acute or chronic DVT within the either lower extremity with special attention paid to the left popliteal and tibial veins. There is a 16.6 x 3.9 x 3.5 cm complex fluid collection extending from the left popliteal fossa to the proximal/mid aspect of the left calf, not seen on 08/05/2019. - Discontinued Eliquis anticoagulation with Eliquis on 09/05/2019. - Bilateral lower extremity US on 09/18/2019 negative for DVT.  I personally reviewed and went over radiographic studies with the patient.  The results are noted within this dictation.  I personally reviewed the images in PACS.  [place] of leg is negative for DVT.  No role for anticoagulation re-initiation.  Labs demonstrate anemia of chronic disease.  Previous iron studies, folate, and B12  were WNL.  Will not repeat D-dimer today as it would not change treatment at this moment.  Hypercoagulable noted not recommended at this time as it would not change treatment.  Return in 4 weeks.  Would consider repeat doppler study in 8-12 weeks from now.    2. Hemarthrosis involving knee joint, left S/P aspiration and depo-medrol injection by Dr. Aline Brochure on 09/08/2019 with 40 cc of aspirated blood.  3. Normocytic anemia Anemia of chronic disease.  Renal function is stable without obvious signs of renal dysfunction.  Will recheck labs in 4 weeks: CBC diff, CMET, iron/TIBC, ferritin, B12, MMA, and folate.   ORDERS PLACED FOR THIS ENCOUNTER: Orders Placed This Encounter  Procedures  . CBC with Differential  . Comprehensive metabolic panel  . Iron and TIBC  . Ferritin  . Vitamin B12  . Folate  . Methylmalonic acid, serum    MEDICATIONS PRESCRIBED THIS ENCOUNTER: No orders of the defined types were placed in this encounter.   All questions were answered. The patient knows to call the clinic with any problems, questions or concerns. We can certainly see the patient much sooner if necessary.  Patient and plan discussed with Dr. Derek Jack and he is in agreement with the aforementioned.   This note is electronically signed by: Robynn Pane, PA-C 09/19/2019 11:58 AM

## 2019-09-26 ENCOUNTER — Encounter: Payer: Self-pay | Admitting: Adult Health

## 2019-09-26 ENCOUNTER — Non-Acute Institutional Stay (SKILLED_NURSING_FACILITY): Payer: Medicare HMO | Admitting: Adult Health

## 2019-09-26 DIAGNOSIS — I10 Essential (primary) hypertension: Secondary | ICD-10-CM

## 2019-09-26 DIAGNOSIS — R600 Localized edema: Secondary | ICD-10-CM

## 2019-09-26 DIAGNOSIS — D649 Anemia, unspecified: Secondary | ICD-10-CM

## 2019-09-26 DIAGNOSIS — I82532 Chronic embolism and thrombosis of left popliteal vein: Secondary | ICD-10-CM | POA: Diagnosis not present

## 2019-09-26 NOTE — Progress Notes (Signed)
Location:    [facility] Room Number: 149/D Place of Service:  SNF (31)    CODE STATUS: Full Code  Allergies  Allergen Reactions  . Cellcept [Mycophenolate Mofetil]     Muscle cramps, mouth soreness, difficulty swallowing, bruising  . Statins     Dx of in  . Ciprofloxacin Hcl Other (See Comments)    Chills, sick, could not tolerate it  . Hydrocodone Nausea Only  . Penicillins Rash    Chief Complaint  Patient presents with  . Discharge Note    Discharge Visit    HPI:  She is being discharged to home with home health for pt/ot/cna. She will need a wheelchair; her prescriptions written and will need to follow up with her medical provider. She had been hospitalized for weakness and bilateral lower extremity edema. She was admitted to this facility for short term rehab. She has participated in pt/ot. She continues to require increased assistance with her adls; and ability to stand. She does have the ability to ambulate very short distance once she is on her feet. She does have assistance at home provided by her family. She is going to complete her therapy on a home health basis.    Past Medical History:  Diagnosis Date  . Arthritis    spinal stenosis  . Elevated LFTs    secondary to fatty liver, negative work-up in 2011  . Hypercholesterolemia   . Hypertension   . Lumbar spinal stenosis 01/14/2018   L3-4 and L4-5  . Myopathy 02/15/2018  . Osteoarthritis    right knee  . Peripheral neuropathy 07/06/2018  . PONV (postoperative nausea and vomiting)   . Uterine cancer ([place]) 08/2006   grade 1, no recurrence up to 2013    Past Surgical History:  Procedure Laterality Date  . 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 APPENDECTOMY  1973  . BREAST BIOPSY Left 2018   Benign  . CHOLECYSTECTOMY  1973  . COLONOSCOPY    06/21/2007   EGB:TDVVOH rectum/Sigmoid diverticula, diminutive hepatic flexure polyp s/p bx. Benign.   . COLONOSCOPY N/A  10/17/2012   Dr. Oneida Alar- moderate diverticulosis was noted in the sigmoid colon, moderate sized internal hemorrhoids. next tcs in10 years  . ESOPHAGOGASTRODUODENOSCOPY N/A 03/20/2016   Dr. Oneida Alar, widely patent Schatzki ring, anemia due to ASA induced erosive gastritis, mild duodenitis. gastric bx benign without H.pylori.   Marland Kitchen FOOT SURGERY  2007   Pins in toes on left foot, 5 to 6 yrs ago  . GIVENS CAPSULE STUDY N/A 03/20/2016   Procedure: GIVENS CAPSULE STUDY;  Surgeon: Danie Binder, MD;  [facility]: [facility];  Service: Endoscopy;  Laterality: N/A;  . MUSCLE BIOPSY Left 06/07/2018   Procedure: LEFT THIGH MUSCLE BIOPSY;  Surgeon: Erroll Luna, MD;  Location: [facility];  Service: General;  Laterality: Left;  . TONSILLECTOMY      Social History   Socioeconomic History  . Marital status: Married    Spouse name: Jeneen Rinks   . Number of children: 3  . Years of education: 62  . Highest education level: 12th grade  Occupational History  . Occupation: retired   Tobacco Use  . Smoking status: Never Smoker  . Smokeless tobacco: Never Used  . Tobacco comment: Never smoked  Vaping Use  . Vaping Use: Never used  Substance and Sexual Activity  . Alcohol use: No  . Drug use: No  . Sexual activity: Not Currently  Birth control/protection: Post-menopausal, Surgical  Other Topics Concern  . Not on file  Social History Narrative   Husband Jeneen Rinks recently passed away   Caffeine use: soda daily   Right handed    Social Determinants of Health   Financial Resource Strain:   . Difficulty of Paying Living Expenses:   Food Insecurity:   . Worried About Charity fundraiser in the Last Year:   . Arboriculturist in the Last Year:   Transportation Needs:   . Film/video editor (Medical):   Marland Kitchen Lack of Transportation (Non-Medical):   Physical Activity:   . Days of Exercise per Week:   . Minutes of Exercise per Session:   Stress:   . Feeling of Stress :   Social  Connections: Unknown  . Frequency of Communication with Friends and Family: Not on file  . Frequency of Social Gatherings with Friends and Family: Not on file  . Attends Religious Services: Not on file  . Active Member of Clubs or Organizations: No  . Attends Archivist Meetings: Not on file  . Marital Status: Widowed  Intimate Partner Violence:   . Fear of Current or Ex-Partner:   . Emotionally Abused:   Marland Kitchen Physically Abused:   . Sexually Abused:    Family History  Problem Relation Age of Onset  . Heart disease Father   . Bladder Cancer Brother        in remission   . Hypertension Sister   . Dementia Sister   . Diabetes type II Sister   . Hypertension Sister   . Aneurysm Sister   . Hypertension Brother   . Arthritis Brother   . Hypertension Son   . Colon cancer Neg Hx     VITAL SIGNS BP 140/66   Pulse 80   Temp (!) 97.2 F (36.2 C) (Oral)   Resp 20   Ht 5' 4"  (1.626 m)   Wt 154 lb 6.4 oz (70 kg)   BMI 26.50 kg/m   Patient's Medications  New Prescriptions   No medications on file  Previous Medications   ACETAMINOPHEN (TYLENOL) 650 MG CR TABLET    Take 650 mg by mouth every 6 (six) hours.    DICLOFENAC SODIUM (VOLTAREN) 1 % GEL    Apply 2 g topically every 6 (six) hours as needed.    EZETIMIBE (ZETIA) 10 MG TABLET    Take 1 tablet (10 mg total) by mouth daily.   FOLIC ACID (FOLVITE) 1 MG TABLET    Take 1 tablet (1 mg total) by mouth daily.   FUROSEMIDE (LASIX) 40 MG TABLET    Take 1 tablet (40 mg total) by mouth daily.   GABAPENTIN (NEURONTIN) 100 MG CAPSULE    Take 1 capsule (100 mg total) by mouth at bedtime.   LINZESS 145 MCG CAPS CAPSULE    Take 1 capsule (145 mcg total) by mouth daily as needed.   MAGNESIUM OXIDE (MAG-OX) 400 (241.3 MG) MG TABLET    Take 1 tablet (400 mg total) by mouth daily.   NON FORMULARY    Fluid restriction of 1.8 liters per day (1760m total) 7-3 = 800 ml 3-11 = 800 ml 11-7 = 175 ml Document totals qshift. Every Shift Day,  Evening, Nigh   NON FORMULARY    Diet: _____ Regular, __x____ NAS, _______Consistent Carbohydrate, _______NPO _____Other   PANTOPRAZOLE (PROTONIX) 40 MG TABLET    Take 1 tablet (40 mg total) by mouth daily.  POTASSIUM CHLORIDE SA (KLOR-CON) 20 MEQ TABLET    Take 20 mEq by mouth daily.  Modified Medications   No medications on file  Discontinued Medications   No medications on file     SIGNIFICANT DIAGNOSTIC EXAMS  PREVIOUS  08-04-19: chest x-ray: No active disease.   08-05-19: bilateral lower extremity venous doppler:  1. 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 2. No evidence of acute or chronic DVT within the right lower extremity.  NO NEW EXAMS     LABS REVIEWED PREVIOUS  08-04-19: wbc 6.6; hgb 8.9; hct 29.2; mcv 97.7 plt 228; glucose 125; bun 33; creat 0.58 k+ 3.2; na++ 142 ca 8.9 liver normal albumin 3.2 mag 1.9  08-07-19: wbc 6.7; hgb 7.8; hct 25.6; mcv 98.5 plt 226; glucose 121; bun 31; creat 0.73 ;k+ 4.2; na++ 142; ca 8.8  08-10-19: wbc 5.4; hgb 7.4; hct 24.4; mcv 97.6 plt 271;  08-14-19 hgb 7.7; hct 26.0; glucose 92; bun 38; creat 0.82; k+ 4.0; na++ 142; ca 9.1  08-22-19: urine culture: proteus mirabilis: rocephin 08-23-19: wbc 6.6; hgb 8.6; hct 29.3; mcv 99.0 plt 340; glucose 132; bun 40; creat 0.93; k+ 3.3; na++ 144; ca 9.6 liver normal albumin 3.2 vit B 12: 319; foalte 102; ferritin 142; iron 48; tibc 298  NO NEW LABS.    Review of Systems  Constitutional: Negative for malaise/fatigue.  Respiratory: Negative for cough and shortness of breath.   Cardiovascular: Negative for chest pain, palpitations and leg swelling.  Gastrointestinal: Negative for abdominal pain, constipation and heartburn.    Musculoskeletal: Negative for back pain, joint pain and myalgias.  Skin: Negative.   Neurological: Negative for dizziness.  Psychiatric/Behavioral: The patient is not nervous/anxious.     Physical Exam Constitutional:      General: She is not in acute distress.    Appearance: She is well-developed. She is not diaphoretic.  Neck:     Thyroid: No thyromegaly.  Cardiovascular:     Rate and Rhythm: Normal rate and regular rhythm.     Pulses: Normal pulses.     Heart sounds: Normal heart sounds.  Pulmonary:     Effort: Pulmonary effort is normal. No respiratory distress.     Breath sounds: Normal breath sounds.  Abdominal:     General: Bowel sounds are normal. There is no distension.     Palpations: Abdomen is soft.     Tenderness: There is no abdominal tenderness.  Musculoskeletal:        General: Normal range of motion.     Cervical back: Neck supple.     Right lower leg: Edema present.     Left lower leg: Edema present.  Lymphadenopathy:     Cervical: No cervical adenopathy.  Skin:    General: Skin is warm and dry.  Neurological:     Mental Status: She is alert and oriented to person, place, and time.  Psychiatric:        Mood and Affect: Mood normal.     ASSESSMENT/ PLAN:  Patient is being discharged with the following home health services:  Pt/ot/cna: to evaluate and treat as indicated for gait balance strength adl training adl care.   Patient is being discharged with the following durable medical equipment:  Standard wheelchair with elevated leg rests; cushion; anti-tippers; brake extensions. To allow her to maintain her current level of independence with her adls which cannot be achieved with a cane crutches or walker she is able to propel her wheelchair.   Patient has been advised to f/u with their PCP in 1-2 weeks to bring them up to date on their rehab stay.  Social services at facility was responsible for arranging this appointment.  Pt was provided with a 30 day  supply of prescriptions for medications and refills must be obtained from their PCP.  For controlled substances, a more limited supply may be provided adequate until PCP appointment only.  A 30 day supply of her prescription medications have been sent to [facility] in [place]  Time spent with patient: 45 minutes: dme; medications; home health.     Ok Edwards NP [facility]  Contact [phone] Monday through Friday 8am- 5pm  After hours call [phone]

## 2019-10-02 DIAGNOSIS — K7581 Nonalcoholic steatohepatitis (NASH): Secondary | ICD-10-CM | POA: Diagnosis not present

## 2019-10-02 DIAGNOSIS — N390 Urinary tract infection, site not specified: Secondary | ICD-10-CM | POA: Diagnosis not present

## 2019-10-02 DIAGNOSIS — M1711 Unilateral primary osteoarthritis, right knee: Secondary | ICD-10-CM | POA: Diagnosis not present

## 2019-10-02 DIAGNOSIS — G609 Hereditary and idiopathic neuropathy, unspecified: Secondary | ICD-10-CM | POA: Diagnosis not present

## 2019-10-02 DIAGNOSIS — I82452 Acute embolism and thrombosis of left peroneal vein: Secondary | ICD-10-CM | POA: Diagnosis not present

## 2019-10-02 DIAGNOSIS — D649 Anemia, unspecified: Secondary | ICD-10-CM | POA: Diagnosis not present

## 2019-10-02 DIAGNOSIS — I82442 Acute embolism and thrombosis of left tibial vein: Secondary | ICD-10-CM | POA: Diagnosis not present

## 2019-10-02 DIAGNOSIS — I10 Essential (primary) hypertension: Secondary | ICD-10-CM | POA: Diagnosis not present

## 2019-10-02 DIAGNOSIS — I82432 Acute embolism and thrombosis of left popliteal vein: Secondary | ICD-10-CM | POA: Diagnosis not present

## 2019-10-03 ENCOUNTER — Telehealth: Payer: Self-pay | Admitting: Family Medicine

## 2019-10-03 ENCOUNTER — Ambulatory Visit: Payer: Medicare HMO | Admitting: Family Medicine

## 2019-10-03 NOTE — Telephone Encounter (Signed)
Hezzie Bump PT from [facility] called to get verbal orders for PT for patient from Dr. Moshe Cipro. Twice a week for 4 weeks and Once a week for 4 weeks to help with transfers, walking, lower extremity strengthening and fall prevention. Can call back to confirm at [phone]

## 2019-10-03 NOTE — Telephone Encounter (Signed)
Left message on verified machine giving verbal orders.

## 2019-10-04 DIAGNOSIS — I10 Essential (primary) hypertension: Secondary | ICD-10-CM | POA: Diagnosis not present

## 2019-10-04 DIAGNOSIS — I82442 Acute embolism and thrombosis of left tibial vein: Secondary | ICD-10-CM | POA: Diagnosis not present

## 2019-10-04 DIAGNOSIS — K7581 Nonalcoholic steatohepatitis (NASH): Secondary | ICD-10-CM | POA: Diagnosis not present

## 2019-10-04 DIAGNOSIS — G609 Hereditary and idiopathic neuropathy, unspecified: Secondary | ICD-10-CM | POA: Diagnosis not present

## 2019-10-04 DIAGNOSIS — I82432 Acute embolism and thrombosis of left popliteal vein: Secondary | ICD-10-CM | POA: Diagnosis not present

## 2019-10-04 DIAGNOSIS — M1711 Unilateral primary osteoarthritis, right knee: Secondary | ICD-10-CM | POA: Diagnosis not present

## 2019-10-04 DIAGNOSIS — N390 Urinary tract infection, site not specified: Secondary | ICD-10-CM | POA: Diagnosis not present

## 2019-10-04 DIAGNOSIS — D649 Anemia, unspecified: Secondary | ICD-10-CM | POA: Diagnosis not present

## 2019-10-04 DIAGNOSIS — I82452 Acute embolism and thrombosis of left peroneal vein: Secondary | ICD-10-CM | POA: Diagnosis not present

## 2019-10-06 DIAGNOSIS — I82442 Acute embolism and thrombosis of left tibial vein: Secondary | ICD-10-CM | POA: Diagnosis not present

## 2019-10-06 DIAGNOSIS — K7581 Nonalcoholic steatohepatitis (NASH): Secondary | ICD-10-CM | POA: Diagnosis not present

## 2019-10-06 DIAGNOSIS — M1711 Unilateral primary osteoarthritis, right knee: Secondary | ICD-10-CM | POA: Diagnosis not present

## 2019-10-06 DIAGNOSIS — I10 Essential (primary) hypertension: Secondary | ICD-10-CM | POA: Diagnosis not present

## 2019-10-06 DIAGNOSIS — G609 Hereditary and idiopathic neuropathy, unspecified: Secondary | ICD-10-CM | POA: Diagnosis not present

## 2019-10-06 DIAGNOSIS — I82432 Acute embolism and thrombosis of left popliteal vein: Secondary | ICD-10-CM | POA: Diagnosis not present

## 2019-10-06 DIAGNOSIS — I82452 Acute embolism and thrombosis of left peroneal vein: Secondary | ICD-10-CM | POA: Diagnosis not present

## 2019-10-06 DIAGNOSIS — N390 Urinary tract infection, site not specified: Secondary | ICD-10-CM | POA: Diagnosis not present

## 2019-10-06 DIAGNOSIS — D649 Anemia, unspecified: Secondary | ICD-10-CM | POA: Diagnosis not present

## 2019-10-09 DIAGNOSIS — I82432 Acute embolism and thrombosis of left popliteal vein: Secondary | ICD-10-CM | POA: Diagnosis not present

## 2019-10-09 DIAGNOSIS — D649 Anemia, unspecified: Secondary | ICD-10-CM | POA: Diagnosis not present

## 2019-10-09 DIAGNOSIS — M1711 Unilateral primary osteoarthritis, right knee: Secondary | ICD-10-CM | POA: Diagnosis not present

## 2019-10-09 DIAGNOSIS — I10 Essential (primary) hypertension: Secondary | ICD-10-CM | POA: Diagnosis not present

## 2019-10-09 DIAGNOSIS — G609 Hereditary and idiopathic neuropathy, unspecified: Secondary | ICD-10-CM | POA: Diagnosis not present

## 2019-10-09 DIAGNOSIS — I82452 Acute embolism and thrombosis of left peroneal vein: Secondary | ICD-10-CM | POA: Diagnosis not present

## 2019-10-09 DIAGNOSIS — N390 Urinary tract infection, site not specified: Secondary | ICD-10-CM | POA: Diagnosis not present

## 2019-10-09 DIAGNOSIS — K7581 Nonalcoholic steatohepatitis (NASH): Secondary | ICD-10-CM | POA: Diagnosis not present

## 2019-10-09 DIAGNOSIS — I82442 Acute embolism and thrombosis of left tibial vein: Secondary | ICD-10-CM | POA: Diagnosis not present

## 2019-10-11 ENCOUNTER — Other Ambulatory Visit: Payer: Self-pay

## 2019-10-11 ENCOUNTER — Inpatient Hospital Stay (HOSPITAL_COMMUNITY): Payer: Medicare HMO | Attending: Hematology

## 2019-10-11 DIAGNOSIS — I82432 Acute embolism and thrombosis of left popliteal vein: Secondary | ICD-10-CM | POA: Diagnosis not present

## 2019-10-11 DIAGNOSIS — D649 Anemia, unspecified: Secondary | ICD-10-CM

## 2019-10-11 DIAGNOSIS — M25062 Hemarthrosis, left knee: Secondary | ICD-10-CM | POA: Diagnosis not present

## 2019-10-11 DIAGNOSIS — Z7901 Long term (current) use of anticoagulants: Secondary | ICD-10-CM | POA: Diagnosis not present

## 2019-10-11 DIAGNOSIS — I82412 Acute embolism and thrombosis of left femoral vein: Secondary | ICD-10-CM

## 2019-10-11 DIAGNOSIS — I82452 Acute embolism and thrombosis of left peroneal vein: Secondary | ICD-10-CM | POA: Diagnosis not present

## 2019-10-11 DIAGNOSIS — K7581 Nonalcoholic steatohepatitis (NASH): Secondary | ICD-10-CM | POA: Diagnosis not present

## 2019-10-11 DIAGNOSIS — D638 Anemia in other chronic diseases classified elsewhere: Secondary | ICD-10-CM | POA: Diagnosis not present

## 2019-10-11 DIAGNOSIS — Z86718 Personal history of other venous thrombosis and embolism: Secondary | ICD-10-CM | POA: Insufficient documentation

## 2019-10-11 DIAGNOSIS — N390 Urinary tract infection, site not specified: Secondary | ICD-10-CM | POA: Diagnosis not present

## 2019-10-11 DIAGNOSIS — I82442 Acute embolism and thrombosis of left tibial vein: Secondary | ICD-10-CM | POA: Diagnosis not present

## 2019-10-11 DIAGNOSIS — G609 Hereditary and idiopathic neuropathy, unspecified: Secondary | ICD-10-CM | POA: Diagnosis not present

## 2019-10-11 DIAGNOSIS — Z8542 Personal history of malignant neoplasm of other parts of uterus: Secondary | ICD-10-CM | POA: Insufficient documentation

## 2019-10-11 DIAGNOSIS — Z79899 Other long term (current) drug therapy: Secondary | ICD-10-CM | POA: Insufficient documentation

## 2019-10-11 DIAGNOSIS — M1711 Unilateral primary osteoarthritis, right knee: Secondary | ICD-10-CM | POA: Diagnosis not present

## 2019-10-11 DIAGNOSIS — I10 Essential (primary) hypertension: Secondary | ICD-10-CM | POA: Diagnosis not present

## 2019-10-11 LAB — FOLATE: Folate: 42.1 ng/mL (ref >5.9)

## 2019-10-11 LAB — CBC WITH DIFFERENTIAL/PLATELET
Abs Immature Granulocytes: 0.01 10*3/uL (ref 0.00–0.07)
Basophils Absolute: 0 10*3/uL (ref 0.0–0.1)
Basophils Relative: 1 %
Eosinophils Absolute: 0.1 10*3/uL (ref 0.0–0.5)
Eosinophils Relative: 2 %
HCT: 38.9 % (ref 36.0–46.0)
Hemoglobin: 11.8 g/dL — ABNORMAL LOW (ref 12.0–15.0)
Immature Granulocytes: 0 %
Lymphocytes Relative: 27 %
Lymphs Abs: 1.6 10*3/uL (ref 0.7–4.0)
MCH: 27.7 pg (ref 26.0–34.0)
MCHC: 30.3 g/dL (ref 30.0–36.0)
MCV: 91.3 fL (ref 80.0–100.0)
Monocytes Absolute: 0.6 10*3/uL (ref 0.1–1.0)
Monocytes Relative: 11 %
Neutro Abs: 3.5 10*3/uL (ref 1.7–7.7)
Neutrophils Relative %: 59 %
Platelets: 216 10*3/uL (ref 150–400)
RBC: 4.26 MIL/uL (ref 3.87–5.11)
RDW: 14 % (ref 11.5–15.5)
WBC: 5.7 10*3/uL (ref 4.0–10.5)
nRBC: 0 % (ref 0.0–0.2)

## 2019-10-11 LAB — COMPREHENSIVE METABOLIC PANEL
ALT: 32 U/L (ref 0–44)
AST: 44 U/L — ABNORMAL HIGH (ref 15–41)
Albumin: 3.8 g/dL (ref 3.5–5.0)
Alkaline Phosphatase: 52 U/L (ref 38–126)
Anion gap: 11 (ref 5–15)
BUN: 29 mg/dL — ABNORMAL HIGH (ref 8–23)
CO2: 27 mmol/L (ref 22–32)
Calcium: 9.7 mg/dL (ref 8.9–10.3)
Chloride: 101 mmol/L (ref 98–111)
Creatinine, Ser: 0.8 mg/dL (ref 0.44–1.00)
GFR calc Af Amer: >60 mL/min (ref >60)
GFR calc non Af Amer: >60 mL/min (ref >60)
Glucose, Bld: 94 mg/dL (ref 70–99)
Potassium: 3.5 mmol/L (ref 3.5–5.1)
Sodium: 139 mmol/L (ref 135–145)
Total Bilirubin: 0.8 mg/dL (ref 0.3–1.2)
Total Protein: 6.9 g/dL (ref 6.5–8.1)

## 2019-10-11 LAB — VITAMIN B12: Vitamin B-12: 858 pg/mL (ref 180–914)

## 2019-10-11 LAB — IRON AND TIBC
Iron: 67 ug/dL (ref 28–170)
Saturation Ratios: 19 % (ref 10.4–31.8)
TIBC: 351 ug/dL (ref 250–450)
UIBC: 284 ug/dL

## 2019-10-11 LAB — FERRITIN: Ferritin: 79 ng/mL (ref 11–307)

## 2019-10-12 DIAGNOSIS — D649 Anemia, unspecified: Secondary | ICD-10-CM | POA: Diagnosis not present

## 2019-10-12 DIAGNOSIS — G609 Hereditary and idiopathic neuropathy, unspecified: Secondary | ICD-10-CM | POA: Diagnosis not present

## 2019-10-12 DIAGNOSIS — M1711 Unilateral primary osteoarthritis, right knee: Secondary | ICD-10-CM | POA: Diagnosis not present

## 2019-10-12 DIAGNOSIS — K7581 Nonalcoholic steatohepatitis (NASH): Secondary | ICD-10-CM | POA: Diagnosis not present

## 2019-10-12 DIAGNOSIS — I82432 Acute embolism and thrombosis of left popliteal vein: Secondary | ICD-10-CM | POA: Diagnosis not present

## 2019-10-12 DIAGNOSIS — N390 Urinary tract infection, site not specified: Secondary | ICD-10-CM | POA: Diagnosis not present

## 2019-10-12 DIAGNOSIS — I82442 Acute embolism and thrombosis of left tibial vein: Secondary | ICD-10-CM | POA: Diagnosis not present

## 2019-10-12 DIAGNOSIS — I10 Essential (primary) hypertension: Secondary | ICD-10-CM | POA: Diagnosis not present

## 2019-10-12 DIAGNOSIS — I82452 Acute embolism and thrombosis of left peroneal vein: Secondary | ICD-10-CM | POA: Diagnosis not present

## 2019-10-13 DIAGNOSIS — N390 Urinary tract infection, site not specified: Secondary | ICD-10-CM | POA: Diagnosis not present

## 2019-10-13 DIAGNOSIS — I82442 Acute embolism and thrombosis of left tibial vein: Secondary | ICD-10-CM | POA: Diagnosis not present

## 2019-10-13 DIAGNOSIS — I10 Essential (primary) hypertension: Secondary | ICD-10-CM | POA: Diagnosis not present

## 2019-10-13 DIAGNOSIS — D649 Anemia, unspecified: Secondary | ICD-10-CM | POA: Diagnosis not present

## 2019-10-13 DIAGNOSIS — M1711 Unilateral primary osteoarthritis, right knee: Secondary | ICD-10-CM | POA: Diagnosis not present

## 2019-10-13 DIAGNOSIS — I82452 Acute embolism and thrombosis of left peroneal vein: Secondary | ICD-10-CM | POA: Diagnosis not present

## 2019-10-13 DIAGNOSIS — I82432 Acute embolism and thrombosis of left popliteal vein: Secondary | ICD-10-CM | POA: Diagnosis not present

## 2019-10-13 DIAGNOSIS — K7581 Nonalcoholic steatohepatitis (NASH): Secondary | ICD-10-CM | POA: Diagnosis not present

## 2019-10-13 DIAGNOSIS — G609 Hereditary and idiopathic neuropathy, unspecified: Secondary | ICD-10-CM | POA: Diagnosis not present

## 2019-10-14 LAB — METHYLMALONIC ACID, SERUM: Methylmalonic Acid, Quantitative: 159 nmol/L (ref 0–378)

## 2019-10-16 DIAGNOSIS — I10 Essential (primary) hypertension: Secondary | ICD-10-CM

## 2019-10-16 DIAGNOSIS — M48061 Spinal stenosis, lumbar region without neurogenic claudication: Secondary | ICD-10-CM

## 2019-10-16 DIAGNOSIS — I82442 Acute embolism and thrombosis of left tibial vein: Secondary | ICD-10-CM | POA: Diagnosis not present

## 2019-10-16 DIAGNOSIS — G609 Hereditary and idiopathic neuropathy, unspecified: Secondary | ICD-10-CM | POA: Diagnosis not present

## 2019-10-16 DIAGNOSIS — M1711 Unilateral primary osteoarthritis, right knee: Secondary | ICD-10-CM

## 2019-10-16 DIAGNOSIS — K7581 Nonalcoholic steatohepatitis (NASH): Secondary | ICD-10-CM

## 2019-10-16 DIAGNOSIS — N390 Urinary tract infection, site not specified: Secondary | ICD-10-CM | POA: Diagnosis not present

## 2019-10-16 DIAGNOSIS — D649 Anemia, unspecified: Secondary | ICD-10-CM | POA: Diagnosis not present

## 2019-10-16 DIAGNOSIS — I82452 Acute embolism and thrombosis of left peroneal vein: Secondary | ICD-10-CM | POA: Diagnosis not present

## 2019-10-16 DIAGNOSIS — I82432 Acute embolism and thrombosis of left popliteal vein: Secondary | ICD-10-CM | POA: Diagnosis not present

## 2019-10-17 DIAGNOSIS — I82452 Acute embolism and thrombosis of left peroneal vein: Secondary | ICD-10-CM | POA: Diagnosis not present

## 2019-10-17 DIAGNOSIS — N390 Urinary tract infection, site not specified: Secondary | ICD-10-CM | POA: Diagnosis not present

## 2019-10-17 DIAGNOSIS — M1711 Unilateral primary osteoarthritis, right knee: Secondary | ICD-10-CM | POA: Diagnosis not present

## 2019-10-17 DIAGNOSIS — I82442 Acute embolism and thrombosis of left tibial vein: Secondary | ICD-10-CM | POA: Diagnosis not present

## 2019-10-17 DIAGNOSIS — I82432 Acute embolism and thrombosis of left popliteal vein: Secondary | ICD-10-CM | POA: Diagnosis not present

## 2019-10-17 DIAGNOSIS — I10 Essential (primary) hypertension: Secondary | ICD-10-CM | POA: Diagnosis not present

## 2019-10-17 DIAGNOSIS — K7581 Nonalcoholic steatohepatitis (NASH): Secondary | ICD-10-CM | POA: Diagnosis not present

## 2019-10-17 DIAGNOSIS — G609 Hereditary and idiopathic neuropathy, unspecified: Secondary | ICD-10-CM | POA: Diagnosis not present

## 2019-10-17 DIAGNOSIS — D649 Anemia, unspecified: Secondary | ICD-10-CM | POA: Diagnosis not present

## 2019-10-18 ENCOUNTER — Inpatient Hospital Stay (HOSPITAL_COMMUNITY): Payer: Medicare HMO | Admitting: Hematology

## 2019-10-18 ENCOUNTER — Other Ambulatory Visit: Payer: Self-pay

## 2019-10-18 VITALS — BP 112/62 | HR 83 | Temp 97.1°F | Resp 17

## 2019-10-18 DIAGNOSIS — Z79899 Other long term (current) drug therapy: Secondary | ICD-10-CM | POA: Diagnosis not present

## 2019-10-18 DIAGNOSIS — M25062 Hemarthrosis, left knee: Secondary | ICD-10-CM | POA: Diagnosis not present

## 2019-10-18 DIAGNOSIS — Z8542 Personal history of malignant neoplasm of other parts of uterus: Secondary | ICD-10-CM | POA: Diagnosis not present

## 2019-10-18 DIAGNOSIS — Z7901 Long term (current) use of anticoagulants: Secondary | ICD-10-CM | POA: Diagnosis not present

## 2019-10-18 DIAGNOSIS — I82412 Acute embolism and thrombosis of left femoral vein: Secondary | ICD-10-CM

## 2019-10-18 DIAGNOSIS — D638 Anemia in other chronic diseases classified elsewhere: Secondary | ICD-10-CM | POA: Diagnosis not present

## 2019-10-18 DIAGNOSIS — Z86718 Personal history of other venous thrombosis and embolism: Secondary | ICD-10-CM | POA: Diagnosis not present

## 2019-10-18 NOTE — Patient Instructions (Signed)
[facility] at [facility] Discharge Instructions  You were seen today by Dr. Delton Coombes. He went over your recent results. Please take oral iron every other day. Dr. Delton Coombes will see you back in for labs and follow up.   Thank you for choosing [facility] at [facility] to provide your oncology and hematology care.  To afford each patient quality time with our provider, please arrive at least 15 minutes before your scheduled appointment time.   If you have a lab appointment with the [facility] please come in thru the Main Entrance and check in at the main information desk  You need to re-schedule your appointment should you arrive 10 or more minutes late.  We strive to give you quality time with our providers, and arriving late affects you and other patients whose appointments are after yours.  Also, if you no show three or more times for appointments you may be dismissed from the clinic at the providers discretion.     Again, thank you for choosing [facility].  Our hope is that these requests will decrease the amount of time that you wait before being seen by our physicians.       _____________________________________________________________  Should you have questions after your visit to [facility], please contact our office at [phone] between the hours of 8:00 a.m. and 4:30 p.m.  Voicemails left after 4:00 p.m. will not be returned until the following business day.  For prescription refill requests, have your pharmacy contact our office and allow 72 hours.    Cancer Center Support Programs:   > Cancer Support Group  2nd Tuesday of the month 1pm-2pm, Journey Room

## 2019-10-18 NOTE — Progress Notes (Signed)
[place], [place] [address]   CLINIC:  Medical Oncology/Hematology  PCP:  Fayrene Helper, MD [address] [place] / [place] [address]  [phone]  REASON FOR VISIT:  Follow-up for DVT of left femoral vein  CURRENT THERAPY: Eliquis  INTERVAL HISTORY:  [name] [name], a 86 y.o. female, returns for routine follow-up for her DVT of left femoral vein. [name] [name] was last seen on 09/19/2019 by Debbe Bales, PA-C.  She said that the pain is better overall, but she still has some. She does not take any oral iron, but she has taken it in the past. She does note that it gave her constipation. She does have difficulty getting from a sitting to a standing position. She isn't able to walk much as a result. She has a physical therapist coming in to help her.    REVIEW OF SYSTEMS:  Review of Systems  Constitutional: Negative.   HENT:  Negative.   Eyes: Negative.   Respiratory: Negative.   Cardiovascular: Negative.   Gastrointestinal: Negative.   Endocrine: Negative.   Genitourinary: Negative.    Musculoskeletal: Negative.   Skin: Negative.   Neurological: Negative.   Hematological: Negative.   Psychiatric/Behavioral: Negative.   All other systems reviewed and are negative.   PAST MEDICAL/SURGICAL HISTORY:  Past Medical History:  Diagnosis Date  . Arthritis    spinal stenosis  . Elevated LFTs    secondary to fatty liver, negative work-up in 2011  . Hypercholesterolemia   . Hypertension   . Lumbar spinal stenosis 01/14/2018   L3-4 and L4-5  . Myopathy 02/15/2018  . Osteoarthritis    right knee  . Peripheral neuropathy 07/06/2018  . PONV (postoperative nausea and vomiting)   . Uterine cancer ([facility]) 08/2006   grade 1, no recurrence up to 2013   Past Surgical History:  Procedure Laterality Date  . 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 APPENDECTOMY  1973  . BREAST BIOPSY Left 2018   Benign  .  CHOLECYSTECTOMY  1973  . COLONOSCOPY    06/21/2007   [facility]:NTIRWE rectum/Sigmoid diverticula, diminutive hepatic flexure polyp s/p bx. Benign.   . COLONOSCOPY N/A 10/17/2012   Dr. Oneida Alar- moderate diverticulosis was noted in the sigmoid colon, moderate sized internal hemorrhoids. next tcs in10 years  . ESOPHAGOGASTRODUODENOSCOPY N/A 03/20/2016   Dr. Oneida Alar, widely patent Schatzki ring, anemia due to ASA induced erosive gastritis, mild duodenitis. gastric bx benign without H.pylori.   Marland Kitchen FOOT SURGERY  2007   Pins in toes on left foot, 5 to 6 yrs ago  . GIVENS CAPSULE STUDY N/A 03/20/2016   Procedure: GIVENS CAPSULE STUDY;  Surgeon: Danie Binder, MD;  [facility];  Service: Endoscopy;  Laterality: N/A;  . MUSCLE BIOPSY Left 06/07/2018   Procedure: LEFT THIGH MUSCLE BIOPSY;  Surgeon: Erroll Luna, MD;  Location: [facility];  Service: General;  Laterality: Left;  . TONSILLECTOMY      SOCIAL HISTORY:  Social History   Socioeconomic History  . Marital status: Married    Spouse name: [name] [name]   . Number of children: 3  . Years of education: 42  . Highest education level: 12th grade  Occupational History  . Occupation: retired   Tobacco Use  . Smoking status: Never Smoker  . Smokeless tobacco: Never Used  . Tobacco comment: Never smoked  Vaping Use  . Vaping Use: Never used  Substance  and Sexual Activity  . Alcohol use: No  . Drug use: No  . Sexual activity: Not Currently    Birth control/protection: Post-menopausal, Surgical  Other Topics Concern  . Not on file  Social History Narrative   Husband [name] [name] recently passed away   Caffeine use: soda daily   Right handed    Social Determinants of Health   Financial Resource Strain:   . Difficulty of Paying Living Expenses:   Food Insecurity:   . Worried About Charity fundraiser in the Last Year:   . Arboriculturist in the Last Year:   Transportation Needs:   . Film/video editor (Medical):   Marland Kitchen Lack  of Transportation (Non-Medical):   Physical Activity:   . Days of Exercise per Week:   . Minutes of Exercise per Session:   Stress:   . Feeling of Stress :   Social Connections: Unknown  . Frequency of Communication with Friends and Family: Not on file  . Frequency of Social Gatherings with Friends and Family: Not on file  . Attends Religious Services: Not on file  . Active Member of Clubs or Organizations: No  . Attends Archivist Meetings: Not on file  . Marital Status: Widowed  Intimate Partner Violence:   . Fear of Current or Ex-Partner:   . Emotionally Abused:   Marland Kitchen Physically Abused:   . Sexually Abused:     FAMILY HISTORY:  Family History  Problem Relation Age of Onset  . Heart disease Father   . Bladder Cancer Brother        in remission   . Hypertension Sister   . Dementia Sister   . Diabetes type II Sister   . Hypertension Sister   . Aneurysm Sister   . Hypertension Brother   . Arthritis Brother   . Hypertension Son   . Colon cancer Neg Hx     CURRENT MEDICATIONS:  Current Outpatient Medications  Medication Sig Dispense Refill  . ezetimibe (ZETIA) 10 MG tablet Take 1 tablet (10 mg total) by mouth daily. 30 tablet 0  . folic acid (FOLVITE) 1 MG tablet Take 1 tablet (1 mg total) by mouth daily. 30 tablet 0  . furosemide (LASIX) 40 MG tablet Take 1 tablet (40 mg total) by mouth daily. 30 tablet 0  . gabapentin (NEURONTIN) 100 MG capsule Take 1 capsule (100 mg total) by mouth at bedtime. 30 capsule 0  . LINZESS 145 MCG CAPS capsule Take 1 capsule (145 mcg total) by mouth daily as needed. 30 capsule 0  . magnesium oxide (MAG-OX) 400 (241.3 Mg) MG tablet Take 1 tablet (400 mg total) by mouth daily. 30 tablet 0  . NON FORMULARY Fluid restriction of 1.8 liters per day (1711m total) 7-3 = 800 ml 3-11 = 800 ml 11-7 = 175 ml Document totals qshift. Every Shift Day, Evening, Nigh    . NON FORMULARY Diet: _____ Regular, __x____ NAS, _______Consistent  Carbohydrate, _______NPO _____Other    . pantoprazole (PROTONIX) 40 MG tablet Take 1 tablet (40 mg total) by mouth daily. 30 tablet 0  . potassium chloride SA (KLOR-CON) 20 MEQ tablet Take 20 mEq by mouth daily.    . Vitamin D, Ergocalciferol, (DRISDOL) 1.25 MG (50000 UNIT) CAPS capsule     . acetaminophen (TYLENOL) 650 MG CR tablet Take 650 mg by mouth every 6 (six) hours.  (Patient not taking: Reported on 10/18/2019)    . diclofenac Sodium (VOLTAREN) 1 % GEL Apply 2  g topically every 6 (six) hours as needed.  (Patient not taking: Reported on 10/18/2019)     No current facility-administered medications for this visit.    ALLERGIES:  Allergies  Allergen Reactions  . Cellcept [Mycophenolate Mofetil]     Muscle cramps, mouth soreness, difficulty swallowing, bruising  . Statins     Dx of in  . Ciprofloxacin Hcl Other (See Comments)    Chills, sick, could not tolerate it  . Hydrocodone Nausea Only  . Penicillins Rash    PHYSICAL EXAM:  Performance status (ECOG): 2 - Symptomatic, <50% confined to bed  Vitals:   10/18/19 1101  BP: (!) 112/62  Pulse: 83  Resp: 17  Temp: (!) 97.1 F (36.2 C)  SpO2: 100%   Wt Readings from Last 3 Encounters:  09/26/19 154 lb 6.4 oz (70 kg)  09/18/19 154 lb 6.4 oz (70 kg)  09/11/19 159 lb 3.2 oz (72.2 kg)   Physical Exam Vitals and nursing note reviewed.  Constitutional:      Appearance: Normal appearance.  HENT:     Mouth/Throat:     Mouth: Mucous membranes are moist.  Eyes:     Pupils: Pupils are equal, round, and reactive to light.  Cardiovascular:     Rate and Rhythm: Normal rate and regular rhythm.     Pulses: Normal pulses.     Heart sounds: Normal heart sounds.  Pulmonary:     Effort: Pulmonary effort is normal.     Breath sounds: Normal breath sounds.  Abdominal:     Palpations: Abdomen is soft. There is no mass.     Tenderness: There is no abdominal tenderness.  Musculoskeletal:     Cervical back: Neck supple.     Right  knee: Swelling (mild) present.     Left knee: Swelling (mild) present. Tenderness (mild) present.     Right lower leg: No edema.     Left lower leg: No edema.  Neurological:     Mental Status: She is alert and oriented to person, place, and time.  Psychiatric:        Mood and Affect: Mood normal.        Behavior: Behavior normal.     LABORATORY DATA:  I have reviewed the labs as listed.  CBC Latest Ref Rng & Units 10/11/2019 09/07/2019 08/23/2019  WBC 4.0 - 10.5 K/uL 5.7 3.9(L) 6.6  Hemoglobin 12.0 - 15.0 g/dL 11.8(L) 8.5(L) 8.6(L)  Hematocrit 36 - 46 % 38.9 28.8(L) 29.3(L)  Platelets 150 - 400 K/uL 216 272 340   CMP Latest Ref Rng & Units 10/11/2019 09/07/2019 08/23/2019  Glucose 70 - 99 mg/dL 94 82 132(H)  BUN 8 - 23 mg/dL 29(H) 32(H) 40(H)  Creatinine 0.44 - 1.00 mg/dL 0.80 0.86 0.93  Sodium 135 - 145 mmol/L 139 144 144  Potassium 3.5 - 5.1 mmol/L 3.5 3.4(L) 3.3(L)  Chloride 98 - 111 mmol/L 101 103 106  CO2 22 - 32 mmol/L 27 30 27   Calcium 8.9 - 10.3 mg/dL 9.7 9.0 9.6  Total Protein 6.5 - 8.1 g/dL 6.9 5.9(L) 6.6  Total Bilirubin 0.3 - 1.2 mg/dL 0.8 0.5 0.7  Alkaline Phos 38 - 126 U/L 52 57 62  AST 15 - 41 U/L 44(H) 50(H) 45(H)  ALT 0 - 44 U/L 32 36 34      Component Value Date/Time   RBC 4.26 10/11/2019 1110   MCV 91.3 10/11/2019 1110   MCV 88 10/24/2018 1520   MCH 27.7 10/11/2019 1110  MCHC 30.3 10/11/2019 1110   RDW 14.0 10/11/2019 1110   RDW 13.2 10/24/2018 1520   LYMPHSABS 1.6 10/11/2019 1110   LYMPHSABS 0.7 10/24/2018 1520   MONOABS 0.6 10/11/2019 1110   EOSABS 0.1 10/11/2019 1110   EOSABS 0.1 10/24/2018 1520   BASOSABS 0.0 10/11/2019 1110   BASOSABS 0.0 10/24/2018 1520    DIAGNOSTIC IMAGING:  I have independently reviewed the scans and discussed with the patient. US Venous Img Lower Bilateral (DVT)  Result Date: 09/18/2019 CLINICAL DATA:  86 year old female with a history of possible chronic DVT EXAM: BILATERAL LOWER EXTREMITY VENOUS DOPPLER ULTRASOUND  TECHNIQUE: Gray-scale sonography with graded compression, as well as color Doppler and duplex ultrasound were performed to evaluate the lower extremity deep venous systems from the level of the common femoral vein and including the common femoral, femoral, profunda femoral, popliteal and calf veins including the posterior tibial, peroneal and gastrocnemius veins when visible. The superficial great saphenous vein was also interrogated. Spectral Doppler was utilized to evaluate flow at rest and with distal augmentation maneuvers in the common femoral, femoral and popliteal veins. COMPARISON:  None. FINDINGS: RIGHT LOWER EXTREMITY Common Femoral Vein: No evidence of thrombus. Normal compressibility, respiratory phasicity and response to augmentation. Saphenofemoral Junction: No evidence of thrombus. Normal compressibility and flow on color Doppler imaging. Profunda Femoral Vein: No evidence of thrombus. Normal compressibility and flow on color Doppler imaging. Femoral Vein: No evidence of thrombus. Normal compressibility, respiratory phasicity and response to augmentation. Popliteal Vein: No evidence of thrombus. Normal compressibility, respiratory phasicity and response to augmentation. Calf Veins: No evidence of thrombus. Normal compressibility and flow on color Doppler imaging. Superficial Great Saphenous Vein: No evidence of thrombus. Normal compressibility and flow on color Doppler imaging. Other Findings:  None. LEFT LOWER EXTREMITY Common Femoral Vein: No evidence of thrombus. Normal compressibility, respiratory phasicity and response to augmentation. Saphenofemoral Junction: No evidence of thrombus. Normal compressibility and flow on color Doppler imaging. Profunda Femoral Vein: No evidence of thrombus. Normal compressibility and flow on color Doppler imaging. Femoral Vein: No evidence of thrombus. Normal compressibility, respiratory phasicity and response to augmentation. Popliteal Vein: No evidence of  thrombus. Normal compressibility, respiratory phasicity and response to augmentation. Calf Veins: No evidence of thrombus. Normal compressibility and flow on color Doppler imaging. Superficial Great Saphenous Vein: No evidence of thrombus. Normal compressibility and flow on color Doppler imaging. Other Findings: Anechoic fluid collection within the superficial soft tissues of the medial left calf measures 11.2 cm x 4.2 cm x 3.0 cm IMPRESSION: Sonographic survey of the bilateral lower extremities negative for DVT Nonspecific fluid collection within the medial left calf measures as large as 11.2 cm. Correlation with any history of trauma may be useful. Electronically Signed   By: Corrie Mckusick D.O.   On: 09/18/2019 12:45     ASSESSMENT:  1. Acute deep vein thrombosis (DVT) of left femoral vein (HCC) Weakly provoked left leg DVT: -Status post fall prior to presentation to the ER on 02/05/2019 with left leg swelling. Doppler showed occlusive DVT extending from proximal left femoral vein through left popliteal vein. -He was started on Eliquis since then. Eliquis was discontinued around 06/06/2019. D-dimer was 1.42 on same day. -I have recommended restarting Eliquis on 06/13/2019. She has been taking since then. -Ultrasound of the leg on 08/05/2019 showed occlusive DVT involving left popliteal vein with extension to left posterior tibial vein and one of the paired left peroneal veins similar to 01/2019 examination. -Ultrasound on 08/23/2019 showed no evidence  of acute or chronic DVT within the either lower extremity with special attention paid to the left popliteal and tibial veins. There is a 16.6 x 3.9 x 3.5 cm complex fluid collection extending from the left popliteal fossa to the proximal/mid aspect of the left calf, not seen on 08/05/2019. - Discontinued Eliquis anticoagulation with Eliquis on 09/05/2019. - Bilateral lower extremity US on 09/18/2019 negative for DVT.   2. Hemarthrosis involving knee  joint, left S/P aspiration and depo-medrol injection by Dr. Aline Brochure on 09/08/2019 with 40 cc of aspirated blood.  3. Normocytic anemia Anemia of chronic disease.  Renal function is stable without obvious signs of renal dysfunction.  Will recheck labs in 4 weeks: CBC diff, CMET, iron/TIBC, ferritin, B12, MMA, and folate.  4.  Endometrial adenocarcinoma: -CT scan on 03/06/2019 did not show any evidence of recurrence or metastatic disease.   PLAN:  1. Weakly provoked left leg DVT: -Eliquis was discontinued on 09/05/2019. -Doppler on 09/10/2019 was negative for DVT.  She still having some trouble with ambulation. -Physical exam today did not reveal any leg swellings.  I plan to see her back in 4 weeks with a repeat Doppler.  2. Left knee pain: -Continue tramadol 50 mg every 6 hours as needed.  3.  Normocytic anemia: -Her hemoglobin improved to 11.8 after stopping Eliquis.  W65 and folic acid was normal.  Ferritin was 79. -I have recommended her to start taking iron tablet every other day with stool softener.   Orders placed this encounter:  No orders of the defined types were placed in this encounter.    Derek Jack, MD [facility] [phone]   I, Jacqualyn Posey, am acting as a scribe for Dr. Sanda Linger.  I, Derek Jack MD, have reviewed the above documentation for accuracy and completeness, and I agree with the above.

## 2019-10-19 ENCOUNTER — Telehealth: Payer: Self-pay

## 2019-10-19 DIAGNOSIS — K7581 Nonalcoholic steatohepatitis (NASH): Secondary | ICD-10-CM | POA: Diagnosis not present

## 2019-10-19 DIAGNOSIS — I82442 Acute embolism and thrombosis of left tibial vein: Secondary | ICD-10-CM | POA: Diagnosis not present

## 2019-10-19 DIAGNOSIS — I82452 Acute embolism and thrombosis of left peroneal vein: Secondary | ICD-10-CM | POA: Diagnosis not present

## 2019-10-19 DIAGNOSIS — M1711 Unilateral primary osteoarthritis, right knee: Secondary | ICD-10-CM | POA: Diagnosis not present

## 2019-10-19 DIAGNOSIS — N390 Urinary tract infection, site not specified: Secondary | ICD-10-CM | POA: Diagnosis not present

## 2019-10-19 DIAGNOSIS — G609 Hereditary and idiopathic neuropathy, unspecified: Secondary | ICD-10-CM | POA: Diagnosis not present

## 2019-10-19 DIAGNOSIS — I82432 Acute embolism and thrombosis of left popliteal vein: Secondary | ICD-10-CM | POA: Diagnosis not present

## 2019-10-19 DIAGNOSIS — I10 Essential (primary) hypertension: Secondary | ICD-10-CM | POA: Diagnosis not present

## 2019-10-19 DIAGNOSIS — D649 Anemia, unspecified: Secondary | ICD-10-CM | POA: Diagnosis not present

## 2019-10-19 NOTE — Telephone Encounter (Signed)
LVM for the pt to call to make an appointment

## 2019-10-19 NOTE — Telephone Encounter (Signed)
-----   Message from Fayrene Helper, MD sent at 10/16/2019  1:17 PM EDT ----- Regarding: pls sched in office with mer in next 2 1 to 3 weeks

## 2019-10-20 DIAGNOSIS — I82442 Acute embolism and thrombosis of left tibial vein: Secondary | ICD-10-CM | POA: Diagnosis not present

## 2019-10-20 DIAGNOSIS — N390 Urinary tract infection, site not specified: Secondary | ICD-10-CM | POA: Diagnosis not present

## 2019-10-20 DIAGNOSIS — I10 Essential (primary) hypertension: Secondary | ICD-10-CM | POA: Diagnosis not present

## 2019-10-20 DIAGNOSIS — K7581 Nonalcoholic steatohepatitis (NASH): Secondary | ICD-10-CM | POA: Diagnosis not present

## 2019-10-20 DIAGNOSIS — I82432 Acute embolism and thrombosis of left popliteal vein: Secondary | ICD-10-CM | POA: Diagnosis not present

## 2019-10-20 DIAGNOSIS — G609 Hereditary and idiopathic neuropathy, unspecified: Secondary | ICD-10-CM | POA: Diagnosis not present

## 2019-10-20 DIAGNOSIS — I82452 Acute embolism and thrombosis of left peroneal vein: Secondary | ICD-10-CM | POA: Diagnosis not present

## 2019-10-20 DIAGNOSIS — D649 Anemia, unspecified: Secondary | ICD-10-CM | POA: Diagnosis not present

## 2019-10-20 DIAGNOSIS — M1711 Unilateral primary osteoarthritis, right knee: Secondary | ICD-10-CM | POA: Diagnosis not present

## 2019-10-23 DIAGNOSIS — I82442 Acute embolism and thrombosis of left tibial vein: Secondary | ICD-10-CM | POA: Diagnosis not present

## 2019-10-23 DIAGNOSIS — I82432 Acute embolism and thrombosis of left popliteal vein: Secondary | ICD-10-CM | POA: Diagnosis not present

## 2019-10-23 DIAGNOSIS — N390 Urinary tract infection, site not specified: Secondary | ICD-10-CM | POA: Diagnosis not present

## 2019-10-23 DIAGNOSIS — G609 Hereditary and idiopathic neuropathy, unspecified: Secondary | ICD-10-CM | POA: Diagnosis not present

## 2019-10-23 DIAGNOSIS — I82452 Acute embolism and thrombosis of left peroneal vein: Secondary | ICD-10-CM | POA: Diagnosis not present

## 2019-10-23 DIAGNOSIS — D649 Anemia, unspecified: Secondary | ICD-10-CM | POA: Diagnosis not present

## 2019-10-23 DIAGNOSIS — I10 Essential (primary) hypertension: Secondary | ICD-10-CM | POA: Diagnosis not present

## 2019-10-23 DIAGNOSIS — K7581 Nonalcoholic steatohepatitis (NASH): Secondary | ICD-10-CM | POA: Diagnosis not present

## 2019-10-23 DIAGNOSIS — M1711 Unilateral primary osteoarthritis, right knee: Secondary | ICD-10-CM | POA: Diagnosis not present

## 2019-10-24 DIAGNOSIS — G609 Hereditary and idiopathic neuropathy, unspecified: Secondary | ICD-10-CM | POA: Diagnosis not present

## 2019-10-24 DIAGNOSIS — K7581 Nonalcoholic steatohepatitis (NASH): Secondary | ICD-10-CM | POA: Diagnosis not present

## 2019-10-24 DIAGNOSIS — D649 Anemia, unspecified: Secondary | ICD-10-CM | POA: Diagnosis not present

## 2019-10-24 DIAGNOSIS — M1711 Unilateral primary osteoarthritis, right knee: Secondary | ICD-10-CM | POA: Diagnosis not present

## 2019-10-24 DIAGNOSIS — I10 Essential (primary) hypertension: Secondary | ICD-10-CM | POA: Diagnosis not present

## 2019-10-24 DIAGNOSIS — I82442 Acute embolism and thrombosis of left tibial vein: Secondary | ICD-10-CM | POA: Diagnosis not present

## 2019-10-24 DIAGNOSIS — N390 Urinary tract infection, site not specified: Secondary | ICD-10-CM | POA: Diagnosis not present

## 2019-10-24 DIAGNOSIS — I82452 Acute embolism and thrombosis of left peroneal vein: Secondary | ICD-10-CM | POA: Diagnosis not present

## 2019-10-24 DIAGNOSIS — I82432 Acute embolism and thrombosis of left popliteal vein: Secondary | ICD-10-CM | POA: Diagnosis not present

## 2019-10-25 ENCOUNTER — Telehealth: Payer: Self-pay | Admitting: General Practice

## 2019-10-25 NOTE — Telephone Encounter (Signed)
Called about a missed call. I advised her I did not see anything in her chart that is was from our office.

## 2019-10-26 DIAGNOSIS — I82452 Acute embolism and thrombosis of left peroneal vein: Secondary | ICD-10-CM | POA: Diagnosis not present

## 2019-10-26 DIAGNOSIS — I82442 Acute embolism and thrombosis of left tibial vein: Secondary | ICD-10-CM | POA: Diagnosis not present

## 2019-10-26 DIAGNOSIS — G609 Hereditary and idiopathic neuropathy, unspecified: Secondary | ICD-10-CM | POA: Diagnosis not present

## 2019-10-26 DIAGNOSIS — N390 Urinary tract infection, site not specified: Secondary | ICD-10-CM | POA: Diagnosis not present

## 2019-10-26 DIAGNOSIS — K7581 Nonalcoholic steatohepatitis (NASH): Secondary | ICD-10-CM | POA: Diagnosis not present

## 2019-10-26 DIAGNOSIS — I10 Essential (primary) hypertension: Secondary | ICD-10-CM | POA: Diagnosis not present

## 2019-10-26 DIAGNOSIS — M1711 Unilateral primary osteoarthritis, right knee: Secondary | ICD-10-CM | POA: Diagnosis not present

## 2019-10-26 DIAGNOSIS — D649 Anemia, unspecified: Secondary | ICD-10-CM | POA: Diagnosis not present

## 2019-10-26 DIAGNOSIS — I82432 Acute embolism and thrombosis of left popliteal vein: Secondary | ICD-10-CM | POA: Diagnosis not present

## 2019-10-27 DIAGNOSIS — M1711 Unilateral primary osteoarthritis, right knee: Secondary | ICD-10-CM | POA: Diagnosis not present

## 2019-10-27 DIAGNOSIS — I82452 Acute embolism and thrombosis of left peroneal vein: Secondary | ICD-10-CM | POA: Diagnosis not present

## 2019-10-27 DIAGNOSIS — I10 Essential (primary) hypertension: Secondary | ICD-10-CM | POA: Diagnosis not present

## 2019-10-27 DIAGNOSIS — I82432 Acute embolism and thrombosis of left popliteal vein: Secondary | ICD-10-CM | POA: Diagnosis not present

## 2019-10-27 DIAGNOSIS — G609 Hereditary and idiopathic neuropathy, unspecified: Secondary | ICD-10-CM | POA: Diagnosis not present

## 2019-10-27 DIAGNOSIS — I82442 Acute embolism and thrombosis of left tibial vein: Secondary | ICD-10-CM | POA: Diagnosis not present

## 2019-10-27 DIAGNOSIS — D649 Anemia, unspecified: Secondary | ICD-10-CM | POA: Diagnosis not present

## 2019-10-27 DIAGNOSIS — K7581 Nonalcoholic steatohepatitis (NASH): Secondary | ICD-10-CM | POA: Diagnosis not present

## 2019-10-27 DIAGNOSIS — N390 Urinary tract infection, site not specified: Secondary | ICD-10-CM | POA: Diagnosis not present

## 2019-10-30 DIAGNOSIS — N390 Urinary tract infection, site not specified: Secondary | ICD-10-CM | POA: Diagnosis not present

## 2019-10-30 DIAGNOSIS — I82452 Acute embolism and thrombosis of left peroneal vein: Secondary | ICD-10-CM | POA: Diagnosis not present

## 2019-10-30 DIAGNOSIS — M1711 Unilateral primary osteoarthritis, right knee: Secondary | ICD-10-CM | POA: Diagnosis not present

## 2019-10-30 DIAGNOSIS — G609 Hereditary and idiopathic neuropathy, unspecified: Secondary | ICD-10-CM | POA: Diagnosis not present

## 2019-10-30 DIAGNOSIS — I10 Essential (primary) hypertension: Secondary | ICD-10-CM | POA: Diagnosis not present

## 2019-10-30 DIAGNOSIS — K7581 Nonalcoholic steatohepatitis (NASH): Secondary | ICD-10-CM | POA: Diagnosis not present

## 2019-10-30 DIAGNOSIS — I82442 Acute embolism and thrombosis of left tibial vein: Secondary | ICD-10-CM | POA: Diagnosis not present

## 2019-10-30 DIAGNOSIS — D649 Anemia, unspecified: Secondary | ICD-10-CM | POA: Diagnosis not present

## 2019-10-30 DIAGNOSIS — I82432 Acute embolism and thrombosis of left popliteal vein: Secondary | ICD-10-CM | POA: Diagnosis not present

## 2019-10-31 DIAGNOSIS — M1711 Unilateral primary osteoarthritis, right knee: Secondary | ICD-10-CM | POA: Diagnosis not present

## 2019-10-31 DIAGNOSIS — I10 Essential (primary) hypertension: Secondary | ICD-10-CM | POA: Diagnosis not present

## 2019-10-31 DIAGNOSIS — I82442 Acute embolism and thrombosis of left tibial vein: Secondary | ICD-10-CM | POA: Diagnosis not present

## 2019-10-31 DIAGNOSIS — I82432 Acute embolism and thrombosis of left popliteal vein: Secondary | ICD-10-CM | POA: Diagnosis not present

## 2019-10-31 DIAGNOSIS — I82452 Acute embolism and thrombosis of left peroneal vein: Secondary | ICD-10-CM | POA: Diagnosis not present

## 2019-10-31 DIAGNOSIS — N390 Urinary tract infection, site not specified: Secondary | ICD-10-CM | POA: Diagnosis not present

## 2019-10-31 DIAGNOSIS — K7581 Nonalcoholic steatohepatitis (NASH): Secondary | ICD-10-CM | POA: Diagnosis not present

## 2019-10-31 DIAGNOSIS — D649 Anemia, unspecified: Secondary | ICD-10-CM | POA: Diagnosis not present

## 2019-10-31 DIAGNOSIS — G609 Hereditary and idiopathic neuropathy, unspecified: Secondary | ICD-10-CM | POA: Diagnosis not present

## 2019-11-01 ENCOUNTER — Other Ambulatory Visit: Payer: Self-pay

## 2019-11-01 ENCOUNTER — Encounter: Payer: Self-pay | Admitting: Family Medicine

## 2019-11-01 ENCOUNTER — Ambulatory Visit (INDEPENDENT_AMBULATORY_CARE_PROVIDER_SITE_OTHER): Payer: Medicare HMO | Admitting: Family Medicine

## 2019-11-01 VITALS — BP 112/68 | HR 79 | Resp 15 | Ht 64.0 in | Wt 154.0 lb

## 2019-11-01 DIAGNOSIS — N3000 Acute cystitis without hematuria: Secondary | ICD-10-CM | POA: Diagnosis not present

## 2019-11-01 DIAGNOSIS — Z09 Encounter for follow-up examination after completed treatment for conditions other than malignant neoplasm: Secondary | ICD-10-CM | POA: Diagnosis not present

## 2019-11-01 DIAGNOSIS — K7581 Nonalcoholic steatohepatitis (NASH): Secondary | ICD-10-CM | POA: Diagnosis not present

## 2019-11-01 DIAGNOSIS — I10 Essential (primary) hypertension: Secondary | ICD-10-CM | POA: Diagnosis not present

## 2019-11-01 DIAGNOSIS — G609 Hereditary and idiopathic neuropathy, unspecified: Secondary | ICD-10-CM | POA: Diagnosis not present

## 2019-11-01 DIAGNOSIS — I82432 Acute embolism and thrombosis of left popliteal vein: Secondary | ICD-10-CM | POA: Diagnosis not present

## 2019-11-01 DIAGNOSIS — I82452 Acute embolism and thrombosis of left peroneal vein: Secondary | ICD-10-CM | POA: Diagnosis not present

## 2019-11-01 DIAGNOSIS — M1711 Unilateral primary osteoarthritis, right knee: Secondary | ICD-10-CM | POA: Diagnosis not present

## 2019-11-01 DIAGNOSIS — I82442 Acute embolism and thrombosis of left tibial vein: Secondary | ICD-10-CM | POA: Diagnosis not present

## 2019-11-01 DIAGNOSIS — N390 Urinary tract infection, site not specified: Secondary | ICD-10-CM | POA: Diagnosis not present

## 2019-11-01 DIAGNOSIS — D649 Anemia, unspecified: Secondary | ICD-10-CM | POA: Diagnosis not present

## 2019-11-01 LAB — POCT URINALYSIS DIP (CLINITEK)
Bilirubin, UA: NEGATIVE
Blood, UA: NEGATIVE
Glucose, UA: NEGATIVE mg/dL
Ketones, POC UA: NEGATIVE mg/dL
Nitrite, UA: POSITIVE — AB
POC PROTEIN,UA: NEGATIVE
Spec Grav, UA: 1.02 (ref 1.010–1.025)
Urobilinogen, UA: 1 E.U./dL
pH, UA: 7 (ref 5.0–8.0)

## 2019-11-01 MED ORDER — TRIAMTERENE-HCTZ 37.5-25 MG PO TABS: 1.0000 | ORAL_TABLET | Freq: Every day | ORAL | 3 refills | Status: DC

## 2019-11-01 NOTE — Patient Instructions (Addendum)
F/u in office re evaluate blood pressure early October, call if you need me before, flu vaccine at visit  New lower dose of triamterene 37.5 one daily Continue potassium one daily  Use furosemide one daily ONLY if legs are swollen  Start taking vit D capsule one time per month, stop taking once per week  CBC, chem 7 and EGFR, non fasting 5 days before follow up please  Thanks for choosing [facility], we consider it a privelige to serve you.  Be careful not to fall

## 2019-11-02 ENCOUNTER — Other Ambulatory Visit: Payer: Self-pay | Admitting: Family Medicine

## 2019-11-02 ENCOUNTER — Other Ambulatory Visit (HOSPITAL_COMMUNITY)
Admission: RE | Admit: 2019-11-02 | Discharge: 2019-11-02 | Disposition: A | Discharge status: Home / Self Care | Payer: Medicare HMO | Source: Ambulatory Visit | Attending: Family Medicine | Admitting: Family Medicine

## 2019-11-02 DIAGNOSIS — N3000 Acute cystitis without hematuria: Secondary | ICD-10-CM | POA: Diagnosis not present

## 2019-11-02 MED ORDER — SULFAMETHOXAZOLE-TRIMETHOPRIM 800-160 MG PO TABS
1.0000 | ORAL_TABLET | Freq: Two times a day (BID) | ORAL | 0 refills | Status: DC
Start: 2019-11-02 — End: 2020-01-01

## 2019-11-04 LAB — URINE CULTURE: Culture: >=100000 — AB

## 2019-11-05 ENCOUNTER — Encounter: Payer: Self-pay | Admitting: Family Medicine

## 2019-11-05 DIAGNOSIS — Z09 Encounter for follow-up examination after completed treatment for conditions other than malignant neoplasm: Secondary | ICD-10-CM | POA: Insufficient documentation

## 2019-11-05 NOTE — Assessment & Plan Note (Signed)
Patient in for follow up of recent hospitalization.and SNF stay Discharge summary, and laboratory and radiology data are reviewed, and any questions or concerns  are discussed. Specific issues requiring follow up are specifically addressed.

## 2019-11-05 NOTE — Assessment & Plan Note (Signed)
Symptomatic anmd abn UA , 3 day course of septra prescribed and will f/u

## 2019-11-05 NOTE — Assessment & Plan Note (Signed)
Over corrected, and symptomatic Reduce maxzide to HALF daily, and re eval in 2 months

## 2019-11-05 NOTE — Progress Notes (Signed)
   [name] [name]     MRN: [id number]      DOB: 10-Aug-1932   HPI [name] [name] is here for follow up of recent hospitalization and SNF stay C/o increased generalized weakness and reduced mobility since home, but cost of staying in facility currently prohibitive No falls. Fair appetite. No fever, chills or flank pain but malodorous urine  Regular BM Hospital course is reviewed and Nursing home summary also, was d/c less than 1 week ago Feels light headed at times when changing position  ROS Denies recent fever or chills. Denies sinus pressure, nasal congestion, ear pain or sore throat. Denies chest congestion, productive cough or wheezing. Denies chest pains, palpitations and leg swelling Denies abdominal pain, nausea, vomiting,diarrhea or constipation.    C/O generalized  joint pain, swelling and limitation in mobility. Denies headaches, seizures, . Denies depression, anxiety or insomnia. Denies skin break down or rash.   PE  BP 112/68   Pulse 79   Resp 15   Ht 5' 4"  (1.626 m)   Wt 154 lb (69.9 kg)   SpO2 99%   BMI 26.43 kg/m   Patient alert and oriented and in no cardiopulmonary distress.  HEENT: No facial asymmetry, EOMI,     Neck supple .  Chest: Clear to auscultation bilaterally.  CVS: S1, S2 no murmurs, no S3.Regular rate.  ABD: Soft non tender.   Ext: No edema  MS: Decreased  ROM spine, shoulders, hips and knees.  Skin: Intact, no ulcerations or rash noted.  Psych: Good eye contact, normal affect. Memory intact not anxious or depressed appearing.  CNS: CN 2-12 intact, grade 4 power in lower extremities with reduced muscle tone in lower ext  [facility] discharge follow-up Patient in for follow up of recent hospitalization.and SNF stay Discharge summary, and laboratory and radiology data are reviewed, and any questions or concerns  are discussed. Specific issues requiring follow up are specifically addressed.   Essential hypertension Over  corrected, and symptomatic Reduce maxzide to HALF daily, and re eval in 2 months  Acute cystitis without hematuria Symptomatic anmd abn UA , 3 day course of septra prescribed and will f/u

## 2019-11-08 DIAGNOSIS — I82432 Acute embolism and thrombosis of left popliteal vein: Secondary | ICD-10-CM | POA: Diagnosis not present

## 2019-11-08 DIAGNOSIS — D649 Anemia, unspecified: Secondary | ICD-10-CM | POA: Diagnosis not present

## 2019-11-08 DIAGNOSIS — G609 Hereditary and idiopathic neuropathy, unspecified: Secondary | ICD-10-CM | POA: Diagnosis not present

## 2019-11-08 DIAGNOSIS — I82442 Acute embolism and thrombosis of left tibial vein: Secondary | ICD-10-CM | POA: Diagnosis not present

## 2019-11-08 DIAGNOSIS — M1711 Unilateral primary osteoarthritis, right knee: Secondary | ICD-10-CM | POA: Diagnosis not present

## 2019-11-08 DIAGNOSIS — N390 Urinary tract infection, site not specified: Secondary | ICD-10-CM | POA: Diagnosis not present

## 2019-11-08 DIAGNOSIS — I82452 Acute embolism and thrombosis of left peroneal vein: Secondary | ICD-10-CM | POA: Diagnosis not present

## 2019-11-08 DIAGNOSIS — K7581 Nonalcoholic steatohepatitis (NASH): Secondary | ICD-10-CM | POA: Diagnosis not present

## 2019-11-08 DIAGNOSIS — I10 Essential (primary) hypertension: Secondary | ICD-10-CM | POA: Diagnosis not present

## 2019-11-11 DIAGNOSIS — D649 Anemia, unspecified: Secondary | ICD-10-CM

## 2019-11-11 DIAGNOSIS — I82432 Acute embolism and thrombosis of left popliteal vein: Secondary | ICD-10-CM | POA: Diagnosis not present

## 2019-11-11 DIAGNOSIS — I10 Essential (primary) hypertension: Secondary | ICD-10-CM

## 2019-11-11 DIAGNOSIS — N390 Urinary tract infection, site not specified: Secondary | ICD-10-CM | POA: Diagnosis not present

## 2019-11-11 DIAGNOSIS — I82452 Acute embolism and thrombosis of left peroneal vein: Secondary | ICD-10-CM | POA: Diagnosis not present

## 2019-11-11 DIAGNOSIS — M48061 Spinal stenosis, lumbar region without neurogenic claudication: Secondary | ICD-10-CM

## 2019-11-11 DIAGNOSIS — I82442 Acute embolism and thrombosis of left tibial vein: Secondary | ICD-10-CM | POA: Diagnosis not present

## 2019-11-11 DIAGNOSIS — G609 Hereditary and idiopathic neuropathy, unspecified: Secondary | ICD-10-CM | POA: Diagnosis not present

## 2019-11-11 DIAGNOSIS — K7581 Nonalcoholic steatohepatitis (NASH): Secondary | ICD-10-CM | POA: Diagnosis not present

## 2019-11-11 DIAGNOSIS — M1711 Unilateral primary osteoarthritis, right knee: Secondary | ICD-10-CM | POA: Diagnosis not present

## 2019-11-13 ENCOUNTER — Other Ambulatory Visit: Payer: Self-pay

## 2019-11-13 ENCOUNTER — Encounter (HOSPITAL_COMMUNITY): Payer: Self-pay | Admitting: *Deleted

## 2019-11-13 ENCOUNTER — Ambulatory Visit (HOSPITAL_COMMUNITY)
Admission: RE | Admit: 2019-11-13 | Discharge: 2019-11-13 | Disposition: A | Discharge status: Home / Self Care | Payer: Medicare HMO | Source: Ambulatory Visit | Attending: Hematology | Admitting: Hematology

## 2019-11-13 ENCOUNTER — Telehealth (HOSPITAL_COMMUNITY): Payer: Self-pay | Admitting: Surgery

## 2019-11-13 ENCOUNTER — Inpatient Hospital Stay (HOSPITAL_COMMUNITY): Payer: Medicare HMO | Attending: Hematology

## 2019-11-13 DIAGNOSIS — Z86718 Personal history of other venous thrombosis and embolism: Secondary | ICD-10-CM | POA: Insufficient documentation

## 2019-11-13 DIAGNOSIS — Z8542 Personal history of malignant neoplasm of other parts of uterus: Secondary | ICD-10-CM | POA: Insufficient documentation

## 2019-11-13 DIAGNOSIS — D638 Anemia in other chronic diseases classified elsewhere: Secondary | ICD-10-CM | POA: Insufficient documentation

## 2019-11-13 DIAGNOSIS — I82412 Acute embolism and thrombosis of left femoral vein: Secondary | ICD-10-CM | POA: Diagnosis not present

## 2019-11-13 DIAGNOSIS — Z79899 Other long term (current) drug therapy: Secondary | ICD-10-CM | POA: Diagnosis not present

## 2019-11-13 DIAGNOSIS — Z7901 Long term (current) use of anticoagulants: Secondary | ICD-10-CM | POA: Diagnosis not present

## 2019-11-13 DIAGNOSIS — R600 Localized edema: Secondary | ICD-10-CM | POA: Diagnosis not present

## 2019-11-13 DIAGNOSIS — I82432 Acute embolism and thrombosis of left popliteal vein: Secondary | ICD-10-CM | POA: Diagnosis not present

## 2019-11-13 LAB — CBC WITH DIFFERENTIAL/PLATELET
Abs Immature Granulocytes: 0.02 10*3/uL (ref 0.00–0.07)
Basophils Absolute: 0 10*3/uL (ref 0.0–0.1)
Basophils Relative: 0 %
Eosinophils Absolute: 0.1 10*3/uL (ref 0.0–0.5)
Eosinophils Relative: 2 %
HCT: 39 % (ref 36.0–46.0)
Hemoglobin: 12 g/dL (ref 12.0–15.0)
Immature Granulocytes: 0 %
Lymphocytes Relative: 27 %
Lymphs Abs: 1.4 10*3/uL (ref 0.7–4.0)
MCH: 27.9 pg (ref 26.0–34.0)
MCHC: 30.8 g/dL (ref 30.0–36.0)
MCV: 90.7 fL (ref 80.0–100.0)
Monocytes Absolute: 0.5 10*3/uL (ref 0.1–1.0)
Monocytes Relative: 10 %
Neutro Abs: 3.1 10*3/uL (ref 1.7–7.7)
Neutrophils Relative %: 61 %
Platelets: 202 10*3/uL (ref 150–400)
RBC: 4.3 MIL/uL (ref 3.87–5.11)
RDW: 15.1 % (ref 11.5–15.5)
WBC: 5.2 10*3/uL (ref 4.0–10.5)
nRBC: 0 % (ref 0.0–0.2)

## 2019-11-13 LAB — D-DIMER, QUANTITATIVE: D-Dimer, Quant: 2.42 ug/mL-FEU — ABNORMAL HIGH (ref 0.00–0.50)

## 2019-11-13 NOTE — Telephone Encounter (Signed)
I called [facility] to get the ultrasound results for this pt.  Per [facility], the pt has a partially occlusive thrombus in the left popliteal vein which is new since the most recent ultrasound, but no other DVT.  Dr. Delton Coombes and Diane made aware of the these results.

## 2019-11-13 NOTE — Progress Notes (Signed)
Dr. Delton Coombes has reviewed the US venous imaging report.  Patient is scheduled to follow up with him on 8/26.  He will discuss results with patient during that visit. No further orders at this time.

## 2019-11-15 DIAGNOSIS — N390 Urinary tract infection, site not specified: Secondary | ICD-10-CM | POA: Diagnosis not present

## 2019-11-15 DIAGNOSIS — M1711 Unilateral primary osteoarthritis, right knee: Secondary | ICD-10-CM | POA: Diagnosis not present

## 2019-11-15 DIAGNOSIS — I82432 Acute embolism and thrombosis of left popliteal vein: Secondary | ICD-10-CM | POA: Diagnosis not present

## 2019-11-15 DIAGNOSIS — K7581 Nonalcoholic steatohepatitis (NASH): Secondary | ICD-10-CM | POA: Diagnosis not present

## 2019-11-15 DIAGNOSIS — I10 Essential (primary) hypertension: Secondary | ICD-10-CM | POA: Diagnosis not present

## 2019-11-15 DIAGNOSIS — D649 Anemia, unspecified: Secondary | ICD-10-CM | POA: Diagnosis not present

## 2019-11-15 DIAGNOSIS — G609 Hereditary and idiopathic neuropathy, unspecified: Secondary | ICD-10-CM | POA: Diagnosis not present

## 2019-11-15 DIAGNOSIS — I82452 Acute embolism and thrombosis of left peroneal vein: Secondary | ICD-10-CM | POA: Diagnosis not present

## 2019-11-15 DIAGNOSIS — I82442 Acute embolism and thrombosis of left tibial vein: Secondary | ICD-10-CM | POA: Diagnosis not present

## 2019-11-16 ENCOUNTER — Other Ambulatory Visit: Payer: Self-pay

## 2019-11-16 ENCOUNTER — Encounter (HOSPITAL_COMMUNITY): Payer: Self-pay | Admitting: Hematology

## 2019-11-16 ENCOUNTER — Inpatient Hospital Stay (HOSPITAL_COMMUNITY): Payer: Medicare HMO | Attending: Hematology | Admitting: Hematology

## 2019-11-16 VITALS — BP 130/59 | HR 83 | Temp 96.9°F | Resp 18

## 2019-11-16 DIAGNOSIS — Z7901 Long term (current) use of anticoagulants: Secondary | ICD-10-CM | POA: Diagnosis not present

## 2019-11-16 DIAGNOSIS — D649 Anemia, unspecified: Secondary | ICD-10-CM

## 2019-11-16 DIAGNOSIS — I82412 Acute embolism and thrombosis of left femoral vein: Secondary | ICD-10-CM | POA: Diagnosis not present

## 2019-11-16 DIAGNOSIS — Z86718 Personal history of other venous thrombosis and embolism: Secondary | ICD-10-CM | POA: Insufficient documentation

## 2019-11-16 NOTE — Patient Instructions (Signed)
[place] at [facility] Discharge Instructions  You were seen today by Dr. Delton Coombes. He went over your recent results and scans. Start taking Eliquis 5 mg twice daily. Dr. Delton Coombes will see you back in 3 weeks for labs and follow up.   Thank you for choosing [place] at [facility] to provide your oncology and hematology care.  To afford each patient quality time with our provider, please arrive at least 15 minutes before your scheduled appointment time.   If you have a lab appointment with the [facility] please come in thru the Main Entrance and check in at the main information desk  You need to re-schedule your appointment should you arrive 10 or more minutes late.  We strive to give you quality time with our providers, and arriving late affects you and other patients whose appointments are after yours.  Also, if you no show three or more times for appointments you may be dismissed from the clinic at the providers discretion.     Again, thank you for choosing [facility].  Our hope is that these requests will decrease the amount of time that you wait before being seen by our physicians.       _____________________________________________________________  Should you have questions after your visit to [facility], please contact our office at [phone] between the hours of 8:00 a.m. and 4:30 p.m.  Voicemails left after 4:00 p.m. will not be returned until the following business day.  For prescription refill requests, have your pharmacy contact our office and allow 72 hours.    Cancer Center Support Programs:   > Cancer Support Group  2nd Tuesday of the month 1pm-2pm, Journey Room

## 2019-11-16 NOTE — Progress Notes (Signed)
[place], [place] [address]   CLINIC:  Medical Oncology/Hematology  PCP:  Fayrene Helper, MD [address], [place] / [place] [address]  [phone]  REASON FOR VISIT:  Follow-up for DVT of left femoral vein  PRIOR THERAPY: Eliquis  CURRENT THERAPY: Observation  INTERVAL HISTORY:  [name] [name], a 86 y.o. female, returns for routine follow-up for her DVT of left femoral vein. [name] [name] was last seen on 10/18/2019.  Today [name] [name] reports that [name] [name] is still having trouble walking and her left leg is swollen. [name] [name] continues taking tramadol. [name] [name] had fluid pulled out of her left knee hemarthrosis and the pain improved. [name] [name] denies having dyspnea or SOB.   REVIEW OF SYSTEMS:  Review of Systems  Constitutional: Positive for appetite change (mildly decreased) and fatigue (mild).  Respiratory: Negative for shortness of breath.   Gastrointestinal: Positive for constipation.  Musculoskeletal: Positive for arthralgias (L lower leg).  Hematological: Bruises/bleeds easily (easy bruising).  Psychiatric/Behavioral: Positive for depression (occasional).  All other systems reviewed and are negative.   PAST MEDICAL/SURGICAL HISTORY:  Past Medical History:  Diagnosis Date   Arthritis    spinal stenosis   Elevated LFTs    secondary to fatty liver, negative work-up in 2011   Hypercholesterolemia    Hypertension    Lumbar spinal stenosis 01/14/2018   L3-4 and L4-5   Myopathy 02/15/2018   Osteoarthritis    right knee   Peripheral neuropathy 07/06/2018   PONV (postoperative nausea and vomiting)    Uterine cancer ([facility]) 08/2006   grade 1, no recurrence up to 2013   Past Surgical History:  Procedure Laterality Date   ABDOMINAL HYSTERECTOMY  2008   adenocarcinoma stage 1   APPENDECTOMY  1973   BREAST BIOPSY Left 2018   Benign   CHOLECYSTECTOMY  1973   COLONOSCOPY    06/21/2007   KGU:[id number] rectum/Sigmoid diverticula,  diminutive hepatic flexure polyp s/p bx. Benign.    COLONOSCOPY N/A 10/17/2012   Dr. Oneida Alar- moderate diverticulosis was noted in the sigmoid colon, moderate sized internal hemorrhoids. next tcs in10 years   ESOPHAGOGASTRODUODENOSCOPY N/A 03/20/2016   Dr. Oneida Alar, widely patent Schatzki ring, anemia due to ASA induced erosive gastritis, mild duodenitis. gastric bx benign without H.pylori.    FOOT SURGERY  2007   Pins in toes on left foot, 5 to 6 yrs ago   [place] N/A 03/20/2016   Procedure: GIVENS CAPSULE STUDY;  Surgeon: Danie Binder, MD;  Location: [facility];  Service: Endoscopy;  Laterality: N/A;   MUSCLE BIOPSY Left 06/07/2018   Procedure: LEFT THIGH MUSCLE BIOPSY;  Surgeon: Erroll Luna, MD;  Location: [facility];  Service: General;  Laterality: Left;   TONSILLECTOMY      SOCIAL HISTORY:  Social History   Socioeconomic History   Marital status: Married    Spouse name: Jeneen Rinks    Number of children: 3   Years of education: 12   Highest education level: 12th grade  Occupational History   Occupation: retired   Tobacco Use   Smoking status: Never Smoker   Smokeless tobacco: Never Used   Tobacco comment: Never smoked  Vaping Use   Vaping Use: Never used  Substance and Sexual Activity   Alcohol use: No   Drug use: No   Sexual activity: Not Currently    Birth control/protection: Post-menopausal, Surgical  Other Topics Concern   Not on file  Social History  Narrative   Husband Jeneen Rinks recently passed away   Caffeine use: soda daily   Right handed    Social Determinants of Health   Financial Resource Strain:    Difficulty of Paying Living Expenses: Not on file  Food Insecurity:    Worried About Charity fundraiser in the Last Year: Not on file   [employer] of Food in the Last Year: Not on file  Transportation Needs:    Lack of Transportation (Medical): Not on file   Lack of Transportation (Non-Medical): Not on file   Physical Activity:    Days of Exercise per Week: Not on file   Minutes of Exercise per Session: Not on file  Stress:    Feeling of Stress : Not on file  Social Connections: Unknown   Frequency of Communication with Friends and Family: Not on file   Frequency of Social Gatherings with Friends and Family: Not on file   Attends Religious Services: Not on file   Active Member of Clubs or Organizations: No   Attends Archivist Meetings: Not on file   Marital Status: Widowed  Human resources officer Violence:    Fear of Current or Ex-Partner: Not on file   Emotionally Abused: Not on file   Physically Abused: Not on file   Sexually Abused: Not on file    FAMILY HISTORY:  Family History  Problem Relation Age of Onset   Heart disease Father    Bladder Cancer Brother        in remission    Hypertension Sister    Dementia Sister    Diabetes type II Sister    Hypertension Sister    Aneurysm Sister    Hypertension Brother    Arthritis Brother    Hypertension Son    Colon cancer Neg Hx     CURRENT MEDICATIONS:  Current Outpatient Medications  Medication Sig Dispense Refill   acetaminophen (TYLENOL) 650 MG CR tablet Take 650 mg by mouth every 6 (six) hours.      diclofenac Sodium (VOLTAREN) 1 % GEL Apply 2 g topically every 6 (six) hours as needed.      ezetimibe (ZETIA) 10 MG tablet Take 1 tablet (10 mg total) by mouth daily. 30 tablet 0   folic acid (FOLVITE) 1 MG tablet Take 1 tablet (1 mg total) by mouth daily. 30 tablet 0   furosemide (LASIX) 20 MG tablet      LINZESS 145 MCG CAPS capsule Take 1 capsule (145 mcg total) by mouth daily as needed. 30 capsule 0   NON FORMULARY Fluid restriction of 1.8 liters per day (1770m total) 7-3 = 800 ml 3-11 = 800 ml 11-7 = 175 ml Document totals qshift. Every Shift Day, Evening, Nigh     NON FORMULARY Diet: _____ Regular, __x____ NAS, _______Consistent Carbohydrate, _______NPO _____Other      pantoprazole (PROTONIX) 40 MG tablet Take 1 tablet (40 mg total) by mouth daily. 30 tablet 0   potassium chloride SA (KLOR-CON) 20 MEQ tablet Take 20 mEq by mouth daily.     sulfamethoxazole-trimethoprim (BACTRIM DS) 800-160 MG tablet Take 1 tablet by mouth 2 (two) times daily. 6 tablet 0   triamterene-hydrochlorothiazide (MAXZIDE-25) 37.5-25 MG tablet Take 1 tablet by mouth daily. 90 tablet 3   No current facility-administered medications for this visit.    ALLERGIES:  Allergies  Allergen Reactions   Cellcept [Mycophenolate Mofetil]     Muscle cramps, mouth soreness, difficulty swallowing, bruising   Statins  Dx of in   Ciprofloxacin Hcl Other (See Comments)    Chills, sick, could not tolerate it   Hydrocodone Nausea Only   Penicillins Rash    PHYSICAL EXAM:  Performance status (ECOG): 2 - Symptomatic, <50% confined to bed  Vitals:   11/16/19 1205  BP: (!) 130/59  Pulse: 83  Resp: 18  Temp: (!) 96.9 F (36.1 C)  SpO2: 100%   Wt Readings from Last 3 Encounters:  11/01/19 154 lb (69.9 kg)  09/26/19 154 lb 6.4 oz (70 kg)  09/18/19 154 lb 6.4 oz (70 kg)   Physical Exam Vitals reviewed.  Constitutional:      Appearance: Normal appearance.  Cardiovascular:     Rate and Rhythm: Normal rate and regular rhythm.     Pulses: Normal pulses.     Heart sounds: Normal heart sounds.  Pulmonary:     Effort: Pulmonary effort is normal.     Breath sounds: Normal breath sounds.  Musculoskeletal:     Left knee: Bony tenderness (L knee TTP) present.     Left lower leg: Edema present.  Neurological:     General: No focal deficit present.     Mental Status: [name] [name] is alert and oriented to person, place, and time.  Psychiatric:        Mood and Affect: Mood normal.        Behavior: Behavior normal.     LABORATORY DATA:  I have reviewed the labs as listed.  CBC Latest Ref Rng & Units 11/13/2019 10/11/2019 09/07/2019  WBC 4.0 - 10.5 K/uL 5.2 5.7 3.9(L)  Hemoglobin 12.0 -  15.0 g/dL 12.0 11.8(L) 8.5(L)  Hematocrit 36 - 46 % 39.0 38.9 28.8(L)  Platelets 150 - 400 K/uL 202 216 272   CMP Latest Ref Rng & Units 10/11/2019 09/07/2019 08/23/2019  Glucose 70 - 99 mg/dL 94 82 132(H)  BUN 8 - 23 mg/dL 29(H) 32(H) 40(H)  Creatinine 0.44 - 1.00 mg/dL 0.80 0.86 0.93  Sodium 135 - 145 mmol/L 139 144 144  Potassium 3.5 - 5.1 mmol/L 3.5 3.4(L) 3.3(L)  Chloride 98 - 111 mmol/L 101 103 106  CO2 22 - 32 mmol/L 27 30 27   Calcium 8.9 - 10.3 mg/dL 9.7 9.0 9.6  Total Protein 6.5 - 8.1 g/dL 6.9 5.9(L) 6.6  Total Bilirubin 0.3 - 1.2 mg/dL 0.8 0.5 0.7  Alkaline Phos 38 - 126 U/L 52 57 62  AST 15 - 41 U/L 44(H) 50(H) 45(H)  ALT 0 - 44 U/L 32 36 34      Component Value Date/Time   RBC 4.30 11/13/2019 1042   MCV 90.7 11/13/2019 1042   MCV 88 10/24/2018 1520   MCH 27.9 11/13/2019 1042   MCHC 30.8 11/13/2019 1042   RDW 15.1 11/13/2019 1042   RDW 13.2 10/24/2018 1520   LYMPHSABS 1.4 11/13/2019 1042   LYMPHSABS 0.7 10/24/2018 1520   MONOABS 0.5 11/13/2019 1042   EOSABS 0.1 11/13/2019 1042   EOSABS 0.1 10/24/2018 1520   BASOSABS 0.0 11/13/2019 1042   BASOSABS 0.0 10/24/2018 1520    DIAGNOSTIC IMAGING:  I have independently reviewed the scans and discussed with the patient. US Venous Img Lower Bilateral  Result Date: 11/13/2019 CLINICAL DATA:  History of deep venous thrombosis. EXAM: BILATERAL LOWER EXTREMITY VENOUS DOPPLER ULTRASOUND LEFT LOWER EXTREMITY VENOUS DOPPLER ULTRASOUND TECHNIQUE: Gray-scale sonography with graded compression, as well as color Doppler and duplex ultrasound were performed to evaluate the lower extremity deep venous systems from the level of the  common femoral vein and including the common femoral, femoral, profunda femoral, popliteal and calf veins including the posterior tibial, peroneal and gastrocnemius veins when visible. The superficial great saphenous vein was also interrogated. Spectral Doppler was utilized to evaluate flow at rest and with distal  augmentation maneuvers in the common femoral, femoral and popliteal veins. COMPARISON:  Venous ultrasound 09/18/2019 and 08/23/2019. FINDINGS: RIGHT LOWER EXTREMITY Common Femoral Vein: No evidence of thrombus. Normal compressibility, respiratory phasicity and response to augmentation. Saphenofemoral Junction: No evidence of thrombus. Normal compressibility and flow on color Doppler imaging. Profunda Femoral Vein: No evidence of thrombus. Normal compressibility and flow on color Doppler imaging. Femoral Vein: No evidence of thrombus. Normal compressibility, respiratory phasicity and response to augmentation. Popliteal Vein: No evidence of thrombus. Normal compressibility, respiratory phasicity and response to augmentation. Calf Veins: No evidence of thrombus. Normal compressibility and flow on color Doppler imaging. Superficial Great Saphenous Vein: No evidence of thrombus. Normal compressibility. Venous Reflux:  None. Other Findings:  None. LEFT LOWER EXTREMITY Common Femoral Vein: No evidence of thrombus. Normal compressibility, respiratory phasicity and response to augmentation. Saphenofemoral Junction: No evidence of thrombus. Normal compressibility and flow on color Doppler imaging. Profunda Femoral Vein: No evidence of thrombus. Normal compressibility and flow on color Doppler imaging. Femoral Vein: No evidence of thrombus. Normal compressibility, respiratory phasicity and response to augmentation. Popliteal Vein: Partially occlusive thrombus is seen in the left popliteal vein. Partially occlusive thrombus is seen in the left popliteal vein. The vein cannot be completely compressed. Calf Veins: No evidence of thrombus. Normal compressibility and flow on color Doppler imaging. Superficial Great Saphenous Vein: No evidence of thrombus. Normal compressibility. Venous Reflux:  None. Other Findings: There is a fluid collection in the proximal aspect of the medial left lower leg measuring 9.6 x 2.0 x 3.1 cm.  Subcutaneous edema is present in both lower legs. IMPRESSION: Partially occlusive thrombus in the left popliteal vein is new since the most recent ultrasound. No other DVT is identified. Fluid collection in the medial left calf measures slightly smaller than on the most recent examination. Cannot be definitively characterized but may be a Baker's cyst. Subcutaneous edema in both lower legs is likely due to dependent change. These results will be called to the ordering clinician or representative by the Radiologist Assistant, and communication documented in the PACS or [employer]. Electronically Signed   By: Inge Rise M.D.   On: 11/13/2019 12:22     ASSESSMENT:  1. Acute deep vein thrombosis (DVT) of left femoral vein (HCC) Weakly provoked left leg DVT: -Status post fall prior to presentation to the ER on 02/05/2019 with left leg swelling. Doppler showed occlusive DVT extending from proximal left femoral vein through left popliteal vein. -He was started on Eliquis since then. Eliquis was discontinued around 06/06/2019. D-dimer was 1.42 on same day. -I have recommended restarting Eliquis on 06/13/2019. [name] [name] has been taking since then. -Ultrasound of the leg on 08/05/2019 showed occlusive DVT involving left popliteal vein with extension to left posterior tibial vein and one of the paired left peroneal veins similar to 01/2019 examination. -Ultrasound on 08/23/2019 showed no evidence of acute or chronic DVT within the either lower extremity with special attention paid to the left popliteal and tibial veins. There is a 16.6 x 3.9 x 3.5 cm complex fluid collection extending from the left popliteal fossa to the proximal/mid aspect of the left calf, not seen on 08/05/2019. - Discontinued Eliquis anticoagulation with Eliquis on 09/05/2019. - Bilateral lower  extremity US on 09/18/2019 negative for DVT.   2. Hemarthrosis involving knee joint, left S/P aspiration and depo-medrol injection by Dr.  Aline Brochure on 09/08/2019 with 40 cc of aspirated blood.  3. Normocytic anemia Anemia of chronic disease. Renal function is stable without obvious signs of renal dysfunction. Will recheck labs in 4 weeks: CBC diff, CMET, iron/TIBC, ferritin, B12, MMA, and folate.  4.  Endometrial adenocarcinoma: -CT scan on 03/06/2019 did not show any evidence of recurrence or metastatic disease.   PLAN:  1. Weakly provoked left leg DVT: -We reviewed results of Doppler from 11/13/2019 which showed partially occlusive thrombus in the left popliteal vein new since the most recent ultrasound.  No other DVT identified.  Fluid collection in the medial left calf slightly smaller than most recent exam. -[name] [name] is not being mobile despite very slow reaccumulation of the fluid in the left knee joint. -As the DVT could evolve into life-threatening pulmonary embolism, I have recommended restarting Eliquis at this time.  2. Left knee pain: -Continue tramadol 50 mg every 6 hours as needed. -Continue follow-up with Dr. Aline Brochure.  3.  Normocytic anemia: -Continue iron tablet every other day with stool softener.  Hemoglobin today is 12.  Orders placed this encounter:  Orders Placed This Encounter  Procedures   CBC with Differential/Platelet   Comprehensive metabolic panel   Magnesium     Derek Jack, MD [place] [phone]   I, Milinda Antis, am acting as a scribe for Dr. Sanda Linger.  I, Derek Jack MD, have reviewed the above documentation for accuracy and completeness, and I agree with the above.

## 2019-11-19 DIAGNOSIS — I82452 Acute embolism and thrombosis of left peroneal vein: Secondary | ICD-10-CM | POA: Diagnosis not present

## 2019-11-19 DIAGNOSIS — N390 Urinary tract infection, site not specified: Secondary | ICD-10-CM | POA: Diagnosis not present

## 2019-11-19 DIAGNOSIS — G609 Hereditary and idiopathic neuropathy, unspecified: Secondary | ICD-10-CM

## 2019-11-19 DIAGNOSIS — I82432 Acute embolism and thrombosis of left popliteal vein: Secondary | ICD-10-CM | POA: Diagnosis not present

## 2019-11-19 DIAGNOSIS — I10 Essential (primary) hypertension: Secondary | ICD-10-CM

## 2019-11-19 DIAGNOSIS — M48061 Spinal stenosis, lumbar region without neurogenic claudication: Secondary | ICD-10-CM

## 2019-11-19 DIAGNOSIS — M1711 Unilateral primary osteoarthritis, right knee: Secondary | ICD-10-CM

## 2019-11-19 DIAGNOSIS — K7581 Nonalcoholic steatohepatitis (NASH): Secondary | ICD-10-CM

## 2019-11-19 DIAGNOSIS — I82442 Acute embolism and thrombosis of left tibial vein: Secondary | ICD-10-CM | POA: Diagnosis not present

## 2019-11-19 DIAGNOSIS — D649 Anemia, unspecified: Secondary | ICD-10-CM | POA: Diagnosis not present

## 2019-11-24 DIAGNOSIS — I10 Essential (primary) hypertension: Secondary | ICD-10-CM | POA: Diagnosis not present

## 2019-11-24 DIAGNOSIS — N390 Urinary tract infection, site not specified: Secondary | ICD-10-CM | POA: Diagnosis not present

## 2019-11-24 DIAGNOSIS — G609 Hereditary and idiopathic neuropathy, unspecified: Secondary | ICD-10-CM | POA: Diagnosis not present

## 2019-11-24 DIAGNOSIS — D649 Anemia, unspecified: Secondary | ICD-10-CM | POA: Diagnosis not present

## 2019-11-24 DIAGNOSIS — I82432 Acute embolism and thrombosis of left popliteal vein: Secondary | ICD-10-CM | POA: Diagnosis not present

## 2019-11-24 DIAGNOSIS — K7581 Nonalcoholic steatohepatitis (NASH): Secondary | ICD-10-CM | POA: Diagnosis not present

## 2019-11-24 DIAGNOSIS — M1711 Unilateral primary osteoarthritis, right knee: Secondary | ICD-10-CM | POA: Diagnosis not present

## 2019-11-24 DIAGNOSIS — I82442 Acute embolism and thrombosis of left tibial vein: Secondary | ICD-10-CM | POA: Diagnosis not present

## 2019-11-24 DIAGNOSIS — I82452 Acute embolism and thrombosis of left peroneal vein: Secondary | ICD-10-CM | POA: Diagnosis not present

## 2019-12-04 ENCOUNTER — Other Ambulatory Visit (HOSPITAL_COMMUNITY): Payer: Self-pay | Admitting: Hematology

## 2019-12-06 ENCOUNTER — Other Ambulatory Visit: Payer: Self-pay | Admitting: *Deleted

## 2019-12-06 MED ORDER — POTASSIUM CHLORIDE CRYS ER 20 MEQ PO TBCR: 20.0000 meq | EXTENDED_RELEASE_TABLET | Freq: Every day | ORAL | 0 refills | Status: DC

## 2019-12-07 ENCOUNTER — Other Ambulatory Visit (HOSPITAL_COMMUNITY): Payer: Self-pay | Admitting: *Deleted

## 2019-12-07 ENCOUNTER — Inpatient Hospital Stay (HOSPITAL_COMMUNITY): Payer: Medicare HMO | Attending: Hematology | Admitting: Hematology

## 2019-12-07 ENCOUNTER — Inpatient Hospital Stay (HOSPITAL_COMMUNITY): Payer: Medicare HMO

## 2019-12-07 ENCOUNTER — Other Ambulatory Visit: Payer: Self-pay

## 2019-12-07 VITALS — BP 129/66 | HR 77 | Temp 97.5°F | Resp 16

## 2019-12-07 DIAGNOSIS — I82412 Acute embolism and thrombosis of left femoral vein: Secondary | ICD-10-CM | POA: Diagnosis not present

## 2019-12-07 DIAGNOSIS — D638 Anemia in other chronic diseases classified elsewhere: Secondary | ICD-10-CM | POA: Insufficient documentation

## 2019-12-07 DIAGNOSIS — Z86718 Personal history of other venous thrombosis and embolism: Secondary | ICD-10-CM | POA: Diagnosis not present

## 2019-12-07 DIAGNOSIS — Z7901 Long term (current) use of anticoagulants: Secondary | ICD-10-CM | POA: Diagnosis not present

## 2019-12-07 DIAGNOSIS — D649 Anemia, unspecified: Secondary | ICD-10-CM | POA: Diagnosis not present

## 2019-12-07 DIAGNOSIS — Z79899 Other long term (current) drug therapy: Secondary | ICD-10-CM | POA: Diagnosis not present

## 2019-12-07 DIAGNOSIS — Z8542 Personal history of malignant neoplasm of other parts of uterus: Secondary | ICD-10-CM | POA: Diagnosis not present

## 2019-12-07 DIAGNOSIS — M25062 Hemarthrosis, left knee: Secondary | ICD-10-CM | POA: Diagnosis not present

## 2019-12-07 LAB — COMPREHENSIVE METABOLIC PANEL
ALT: 35 U/L (ref 0–44)
AST: 55 U/L — ABNORMAL HIGH (ref 15–41)
Albumin: 3.6 g/dL (ref 3.5–5.0)
Alkaline Phosphatase: 40 U/L (ref 38–126)
Anion gap: 10 (ref 5–15)
BUN: 28 mg/dL — ABNORMAL HIGH (ref 8–23)
CO2: 25 mmol/L (ref 22–32)
Calcium: 9.9 mg/dL (ref 8.9–10.3)
Chloride: 104 mmol/L (ref 98–111)
Creatinine, Ser: 0.76 mg/dL (ref 0.44–1.00)
GFR calc Af Amer: >60 mL/min (ref >60)
GFR calc non Af Amer: >60 mL/min (ref >60)
Glucose, Bld: 96 mg/dL (ref 70–99)
Potassium: 3 mmol/L — ABNORMAL LOW (ref 3.5–5.1)
Sodium: 139 mmol/L (ref 135–145)
Total Bilirubin: 0.8 mg/dL (ref 0.3–1.2)
Total Protein: 6.4 g/dL — ABNORMAL LOW (ref 6.5–8.1)

## 2019-12-07 LAB — CBC WITH DIFFERENTIAL/PLATELET
Abs Immature Granulocytes: 0.02 10*3/uL (ref 0.00–0.07)
Basophils Absolute: 0 10*3/uL (ref 0.0–0.1)
Basophils Relative: 0 %
Eosinophils Absolute: 0.1 10*3/uL (ref 0.0–0.5)
Eosinophils Relative: 2 %
HCT: 40.3 % (ref 36.0–46.0)
Hemoglobin: 12.2 g/dL (ref 12.0–15.0)
Immature Granulocytes: 0 %
Lymphocytes Relative: 27 %
Lymphs Abs: 1.5 10*3/uL (ref 0.7–4.0)
MCH: 27.4 pg (ref 26.0–34.0)
MCHC: 30.3 g/dL (ref 30.0–36.0)
MCV: 90.6 fL (ref 80.0–100.0)
Monocytes Absolute: 0.5 10*3/uL (ref 0.1–1.0)
Monocytes Relative: 9 %
Neutro Abs: 3.5 10*3/uL (ref 1.7–7.7)
Neutrophils Relative %: 62 %
Platelets: 204 10*3/uL (ref 150–400)
RBC: 4.45 MIL/uL (ref 3.87–5.11)
RDW: 15.9 % — ABNORMAL HIGH (ref 11.5–15.5)
WBC: 5.6 10*3/uL (ref 4.0–10.5)
nRBC: 0 % (ref 0.0–0.2)

## 2019-12-07 LAB — MAGNESIUM: Magnesium: 1.5 mg/dL — ABNORMAL LOW (ref 1.7–2.4)

## 2019-12-07 LAB — IRON AND TIBC
Iron: 63 ug/dL (ref 28–170)
Saturation Ratios: 20 % (ref 10.4–31.8)
TIBC: 317 ug/dL (ref 250–450)
UIBC: 254 ug/dL

## 2019-12-07 LAB — FERRITIN: Ferritin: 63 ng/mL (ref 11–307)

## 2019-12-07 MED ORDER — MAGNESIUM OXIDE 400 (241.3 MG) MG PO TABS
400.0000 mg | ORAL_TABLET | Freq: Two times a day (BID) | ORAL | 1 refills | Status: DC
Start: 2019-12-07 — End: 2020-06-18

## 2019-12-07 NOTE — Patient Instructions (Signed)
[place] at [facility] Discharge Instructions  You were seen today by Dr. Delton Coombes. He went over your recent results. You will be prescribed magnesium to take twice daily. Dr. Delton Coombes will see you back in 8 weeks for labs and follow up.   Thank you for choosing [place] at [facility] to provide your oncology and hematology care.  To afford each patient quality time with our provider, please arrive at least 15 minutes before your scheduled appointment time.   If you have a lab appointment with the [facility] please come in thru the Main Entrance and check in at the main information desk  You need to re-schedule your appointment should you arrive 10 or more minutes late.  We strive to give you quality time with our providers, and arriving late affects you and other patients whose appointments are after yours.  Also, if you no show three or more times for appointments you may be dismissed from the clinic at the providers discretion.     Again, thank you for choosing [facility]  Our hope is that these requests will decrease the amount of time that you wait before being seen by our physicians.       _____________________________________________________________  Should you have questions after your visit to [facility], please contact our office at [phone] between the hours of 8:00 a.m. and 4:30 p.m.  Voicemails left after 4:00 p.m. will not be returned until the following business day.  For prescription refill requests, have your pharmacy contact our office and allow 72 hours.    Cancer Center Support Programs:   > Cancer Support Group  2nd Tuesday of the month 1pm-2pm, Journey Room

## 2019-12-07 NOTE — Progress Notes (Signed)
[place],  [address]   CLINIC:  Medical Oncology/Hematology  PCP:  Fayrene Helper, MD [address] [place] / [place] [address]  [phone]  REASON FOR VISIT:  Follow-up for normocytic anemia and DVT of left femoral vein  PRIOR THERAPY: None  CURRENT THERAPY: Eliquis  INTERVAL HISTORY:  [name] [name], a 86 y.o. female, returns for routine follow-up for her normocytic anemia and DVT of left femoral vein. [name] was last seen on 11/16/2019.  Today she reports feeling okay. Her left leg is still weak. She has restarted taking Eliquis and denies any nose bleeds, hematochezia or hematuria. She reports having constipation at baseline. She denies SOB or pleuritic CP. Her knees feel a little swollen.   REVIEW OF SYSTEMS:  Review of Systems  Constitutional: Positive for appetite change (moderately decreased). Negative for fatigue.  HENT:   Negative for nosebleeds.   Respiratory: Negative for shortness of breath.   Cardiovascular: Negative for chest pain.  Gastrointestinal: Positive for constipation (baseline). Negative for blood in stool.  Genitourinary: Positive for frequency. Negative for hematuria.   Neurological: Positive for headaches (occasional).  All other systems reviewed and are negative.   PAST MEDICAL/SURGICAL HISTORY:  Past Medical History:  Diagnosis Date  . Arthritis    spinal stenosis  . Elevated LFTs    secondary to fatty liver, negative work-up in 2011  . Hypercholesterolemia   . Hypertension   . Lumbar spinal stenosis 01/14/2018   L3-4 and L4-5  . Myopathy 02/15/2018  . Osteoarthritis    right knee  . Peripheral neuropathy 07/06/2018  . PONV (postoperative nausea and vomiting)   . Uterine cancer ([facility]) 08/2006   grade 1, no recurrence up to 2013   Past Surgical History:  Procedure Laterality Date  . 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 APPENDECTOMY  1973  . BREAST  BIOPSY Left 2018   Benign  . CHOLECYSTECTOMY  1973  . COLONOSCOPY    06/21/2007   [facility]:ENIDPO rectum/Sigmoid diverticula, diminutive hepatic flexure polyp s/p bx. Benign.   . COLONOSCOPY N/A 10/17/2012   Dr. Oneida Alar- moderate diverticulosis was noted in the sigmoid colon, moderate sized internal hemorrhoids. next tcs in10 years  . ESOPHAGOGASTRODUODENOSCOPY N/A 03/20/2016   Dr. Oneida Alar, widely patent Schatzki ring, anemia due to ASA induced erosive gastritis, mild duodenitis. gastric bx benign without H.pylori.   Marland Kitchen FOOT SURGERY  2007   Pins in toes on left foot, 5 to 6 yrs ago  . GIVENS CAPSULE STUDY N/A 03/20/2016   Procedure: GIVENS CAPSULE STUDY;  Surgeon: Danie Binder, MD;  Location: [facility];  Service: Endoscopy;  Laterality: N/A;  . MUSCLE BIOPSY Left 06/07/2018   Procedure: LEFT THIGH MUSCLE BIOPSY;  Surgeon: Erroll Luna, MD;  Location: [facility];  Service: General;  Laterality: Left;  . TONSILLECTOMY      SOCIAL HISTORY:  Social History   Socioeconomic History  . Marital status: Married    Spouse name: Jeneen [name]   . Number of children: 3  . Years of education: 26  . Highest education level: 12th grade  Occupational History  . Occupation: retired   Tobacco Use  . Smoking status: Never Smoker  . Smokeless tobacco: Never Used  . Tobacco comment: Never smoked  Vaping Use  . Vaping Use: Never used  Substance and Sexual Activity  . Alcohol use: No  . Drug use: No  .  Sexual activity: Not Currently    Birth control/protection: Post-menopausal, Surgical  Other Topics Concern  . Not on file  Social History Narrative   Husband Jeneen [name] recently passed away   Caffeine use: soda daily   Right handed    Social Determinants of Health   Financial Resource Strain:   . Difficulty of Paying Living Expenses: Not on file  Food Insecurity:   . Worried About Charity fundraiser in the Last Year: Not on file  . Ran Out of Food in the Last Year: Not on file   Transportation Needs:   . Lack of Transportation (Medical): Not on file  . Lack of Transportation (Non-Medical): Not on file  Physical Activity:   . Days of Exercise per Week: Not on file  . Minutes of Exercise per Session: Not on file  Stress:   . Feeling of Stress : Not on file  Social Connections: Unknown  . Frequency of Communication with Friends and Family: Not on file  . Frequency of Social Gatherings with Friends and Family: Not on file  . Attends Religious Services: Not on file  . Active Member of Clubs or Organizations: No  . Attends Archivist Meetings: Not on file  . Marital Status: Widowed  Intimate Partner Violence:   . Fear of Current or Ex-Partner: Not on file  . Emotionally Abused: Not on file  . Physically Abused: Not on file  . Sexually Abused: Not on file    FAMILY HISTORY:  Family History  Problem Relation Age of Onset  . Heart disease Father   . Bladder Cancer Brother        in remission   . Hypertension Sister   . Dementia Sister   . Diabetes type II Sister   . Hypertension Sister   . Aneurysm Sister   . Hypertension Brother   . Arthritis Brother   . Hypertension Son   . Colon cancer Neg Hx     CURRENT MEDICATIONS:  Current Outpatient Medications  Medication Sig Dispense Refill  . acetaminophen (TYLENOL) 650 MG CR tablet Take 650 mg by mouth every 6 (six) hours.     . diclofenac Sodium (VOLTAREN) 1 % GEL Apply 2 g topically every 6 (six) hours as needed.     Marland Kitchen ELIQUIS 5 MG TABS tablet TAKE 1 TABLET TWICE DAILY 180 tablet 2  . ezetimibe (ZETIA) 10 MG tablet Take 1 tablet (10 mg total) by mouth daily. 30 tablet 0  . folic acid (FOLVITE) 1 MG tablet Take 1 tablet (1 mg total) by mouth daily. 30 tablet 0  . furosemide (LASIX) 20 MG tablet     . LINZESS 145 MCG CAPS capsule Take 1 capsule (145 mcg total) by mouth daily as needed. 30 capsule 0  . NON FORMULARY Fluid restriction of 1.8 liters per day (1731m total) 7-3 = 800 ml 3-11 = 800  ml 11-7 = 175 ml Document totals qshift. Every Shift Day, Evening, Nigh    . NON FORMULARY Diet: _____ Regular, __x____ NAS, _______Consistent Carbohydrate, _______NPO _____Other    . pantoprazole (PROTONIX) 40 MG tablet Take 1 tablet (40 mg total) by mouth daily. 30 tablet 0  . potassium chloride SA (KLOR-CON) 20 MEQ tablet Take 1 tablet (20 mEq total) by mouth daily. 30 tablet 0  . sulfamethoxazole-trimethoprim (BACTRIM DS) 800-160 MG tablet Take 1 tablet by mouth 2 (two) times daily. 6 tablet 0  . triamterene-hydrochlorothiazide (MAXZIDE-25) 37.5-25 MG tablet Take 1 tablet by mouth daily.  90 tablet 3  . magnesium oxide (MAG-OX) 400 (241.3 Mg) MG tablet Take 1 tablet (400 mg total) by mouth 2 (two) times daily. 180 tablet 1   No current facility-administered medications for this visit.    ALLERGIES:  Allergies  Allergen Reactions  . Cellcept [Mycophenolate Mofetil]     Muscle cramps, mouth soreness, difficulty swallowing, bruising  . Statins     Dx of in  . Ciprofloxacin Hcl Other (See Comments)    Chills, sick, could not tolerate it  . Hydrocodone Nausea Only  . Penicillins Rash    PHYSICAL EXAM:  Performance status (ECOG): 2 - Symptomatic, <50% confined to bed  Vitals:   12/07/19 1021  BP: 129/66  Pulse: 77  Resp: 16  Temp: (!) 97.5 F (36.4 C)  SpO2: 97%   Wt Readings from Last 3 Encounters:  11/01/19 154 lb (69.9 kg)  09/26/19 154 lb 6.4 oz (70 kg)  09/18/19 154 lb 6.4 oz (70 kg)   Physical Exam Constitutional:      Appearance: Normal appearance.  Cardiovascular:     Rate and Rhythm: Normal rate and regular rhythm.     Pulses: Normal pulses.     Heart sounds: Normal heart sounds.  Pulmonary:     Effort: Pulmonary effort is normal.     Breath sounds: Normal breath sounds.  Musculoskeletal:     Left knee: Effusion (mild) present.  Neurological:     General: No focal deficit present.     Mental Status: She is alert and oriented to person, place, and  time.  Psychiatric:        Mood and Affect: Mood normal.        Behavior: Behavior normal.     LABORATORY DATA:  I have reviewed the labs as listed.  CBC Latest Ref Rng & Units 12/07/2019 11/13/2019 10/11/2019  WBC 4.0 - 10.5 K/uL 5.6 5.2 5.7  Hemoglobin 12.0 - 15.0 g/dL 12.2 12.0 11.8(L)  Hematocrit 36 - 46 % 40.3 39.0 38.9  Platelets 150 - 400 K/uL 204 202 216   CMP Latest Ref Rng & Units 12/07/2019 10/11/2019 09/07/2019  Glucose 70 - 99 mg/dL 96 94 82  BUN 8 - 23 mg/dL 28(H) 29(H) 32(H)  Creatinine 0.44 - 1.00 mg/dL 0.76 0.80 0.86  Sodium 135 - 145 mmol/L 139 139 144  Potassium 3.5 - 5.1 mmol/L 3.0(L) 3.5 3.4(L)  Chloride 98 - 111 mmol/L 104 101 103  CO2 22 - 32 mmol/L 25 27 30   Calcium 8.9 - 10.3 mg/dL 9.9 9.7 9.0  Total Protein 6.5 - 8.1 g/dL 6.4(L) 6.9 5.9(L)  Total Bilirubin 0.3 - 1.2 mg/dL 0.8 0.8 0.5  Alkaline Phos 38 - 126 U/L 40 52 57  AST 15 - 41 U/L 55(H) 44(H) 50(H)  ALT 0 - 44 U/L 35 32 36      Component Value Date/Time   RBC 4.45 12/07/2019 1001   MCV 90.6 12/07/2019 1001   MCV 88 10/24/2018 1520   MCH 27.4 12/07/2019 1001   MCHC 30.3 12/07/2019 1001   RDW 15.9 (H) 12/07/2019 1001   RDW 13.2 10/24/2018 1520   LYMPHSABS 1.5 12/07/2019 1001   LYMPHSABS 0.7 10/24/2018 1520   MONOABS 0.5 12/07/2019 1001   EOSABS 0.1 12/07/2019 1001   EOSABS 0.1 10/24/2018 1520   BASOSABS 0.0 12/07/2019 1001   BASOSABS 0.0 10/24/2018 1520   Lab Results  Component Value Date   TIBC 351 10/11/2019   TIBC 298 08/23/2019   TIBC 317  06/06/2019   FERRITIN 79 10/11/2019   FERRITIN 142 08/23/2019   FERRITIN 104 06/06/2019   IRONPCTSAT 19 10/11/2019   IRONPCTSAT 16 08/23/2019   IRONPCTSAT 18 06/06/2019    DIAGNOSTIC IMAGING:  I have independently reviewed the scans and discussed with the patient. US Venous Img Lower Bilateral  Result Date: 11/13/2019 CLINICAL DATA:  History of deep venous thrombosis. EXAM: BILATERAL LOWER EXTREMITY VENOUS DOPPLER ULTRASOUND LEFT LOWER  EXTREMITY VENOUS DOPPLER ULTRASOUND TECHNIQUE: Gray-scale sonography with graded compression, as well as color Doppler and duplex ultrasound were performed to evaluate the lower extremity deep venous systems from the level of the common femoral vein and including the common femoral, femoral, profunda femoral, popliteal and calf veins including the posterior tibial, peroneal and gastrocnemius veins when visible. The superficial great saphenous vein was also interrogated. Spectral Doppler was utilized to evaluate flow at rest and with distal augmentation maneuvers in the common femoral, femoral and popliteal veins. COMPARISON:  Venous ultrasound 09/18/2019 and 08/23/2019. FINDINGS: RIGHT LOWER EXTREMITY Common Femoral Vein: No evidence of thrombus. Normal compressibility, respiratory phasicity and response to augmentation. Saphenofemoral Junction: No evidence of thrombus. Normal compressibility and flow on color Doppler imaging. Profunda Femoral Vein: No evidence of thrombus. Normal compressibility and flow on color Doppler imaging. Femoral Vein: No evidence of thrombus. Normal compressibility, respiratory phasicity and response to augmentation. Popliteal Vein: No evidence of thrombus. Normal compressibility, respiratory phasicity and response to augmentation. Calf Veins: No evidence of thrombus. Normal compressibility and flow on color Doppler imaging. Superficial Great Saphenous Vein: No evidence of thrombus. Normal compressibility. Venous Reflux:  None. Other Findings:  None. LEFT LOWER EXTREMITY Common Femoral Vein: No evidence of thrombus. Normal compressibility, respiratory phasicity and response to augmentation. Saphenofemoral Junction: No evidence of thrombus. Normal compressibility and flow on color Doppler imaging. Profunda Femoral Vein: No evidence of thrombus. Normal compressibility and flow on color Doppler imaging. Femoral Vein: No evidence of thrombus. Normal compressibility, respiratory phasicity and  response to augmentation. Popliteal Vein: Partially occlusive thrombus is seen in the left popliteal vein. Partially occlusive thrombus is seen in the left popliteal vein. The vein cannot be completely compressed. Calf Veins: No evidence of thrombus. Normal compressibility and flow on color Doppler imaging. Superficial Great Saphenous Vein: No evidence of thrombus. Normal compressibility. Venous Reflux:  None. Other Findings: There is a fluid collection in the proximal aspect of the medial left lower leg measuring 9.6 x 2.0 x 3.1 cm. Subcutaneous edema is present in both lower legs. IMPRESSION: Partially occlusive thrombus in the left popliteal vein is new since the most recent ultrasound. No other DVT is identified. Fluid collection in the medial left calf measures slightly smaller than on the most recent examination. Cannot be definitively characterized but may be a Baker's cyst. Subcutaneous edema in both lower legs is likely due to dependent change. These results will be called to the ordering clinician or representative by the Radiologist Assistant, and communication documented in the PACS or [employer]. Electronically Signed   By: Inge Rise M.D.   On: 11/13/2019 12:22     ASSESSMENT:  1. Acute deep vein thrombosis (DVT) of left femoral vein (HCC) Weakly provoked left leg DVT: -Status post fall prior to presentation to the ER on 02/05/2019 with left leg swelling. Doppler showed occlusive DVT extending from proximal left femoral vein through left popliteal vein. -He was started on Eliquis since then. Eliquis was discontinued around 06/06/2019. D-dimer was 1.42 on same day. -I have recommended restarting Eliquis on  06/13/2019. She has been taking since then. -Ultrasound of the leg on 08/05/2019 showed occlusive DVT involving left popliteal vein with extension to left posterior tibial vein and one of the paired left peroneal veins similar to 01/2019 examination. -Ultrasound on 08/23/2019  showed no evidence of acute or chronic DVT within the either lower extremity with special attention paid to the left popliteal and tibial veins. There is a 16.6 x 3.9 x 3.5 cm complex fluid collection extending from the left popliteal fossa to the proximal/mid aspect of the left calf, not seen on 08/05/2019. - Discontinued Eliquis anticoagulation with Eliquis on 09/05/2019. - Bilateral lower extremity US on 09/18/2019 negative for DVT.   2. Hemarthrosis involving knee joint, left S/P aspiration and depo-medrol injection by Dr. Aline Brochure on 09/08/2019 with 40 cc of aspirated blood.  3. Normocytic anemia Anemia of chronic disease. Renal function is stable without obvious signs of renal dysfunction. Will recheck labs in 4 weeks: CBC diff, CMET, iron/TIBC, ferritin, B12, MMA, and folate.  4. Endometrial adenocarcinoma: -CT scan on 03/06/2019 did not show any evidence of recurrence or metastatic disease.   PLAN:  1. Weakly provoked left leg DVT: -Based on Doppler report from 11/13/2019, I have restarted her back on Eliquis. -There is no worsening of any fluid collection in the left knee.  However she is continuing to have trouble walking. -She has follow-up with Dr. Aline Brochure coming up soon. -She will continue Eliquis at this time.  I will reevaluate her in 8 weeks.  2. Left knee pain: -Continue tramadol 50 mg every 6 hours as needed.  3.Normocytic anemia: -Continue iron tablet every other day with stool softener. -Hemoglobin today is 12.2 and ferritin is 63.  4.  Hypomagnesemia: -Magnesium is 1.5.  Potassium is 3.0. -We will continue potassium 20 mEq daily at home.  She was told to take 2 tablets today. -She will be started on magnesium 400 milligrams twice daily.  Orders placed this encounter:  Orders Placed This Encounter  Procedures  . D-dimer, quantitative  . Magnesium  . Basic metabolic panel     Derek Jack, MD [place] [phone]   I, Milinda Antis, am acting as a scribe for Dr. Sanda Linger.  I, Derek Jack MD, have reviewed the above documentation for accuracy and completeness, and I agree with the above.

## 2019-12-14 ENCOUNTER — Encounter: Payer: Self-pay | Admitting: Orthopedic Surgery

## 2019-12-14 ENCOUNTER — Ambulatory Visit: Payer: Medicare HMO

## 2019-12-14 ENCOUNTER — Other Ambulatory Visit: Payer: Self-pay | Admitting: Orthopedic Surgery

## 2019-12-14 ENCOUNTER — Other Ambulatory Visit: Payer: Self-pay

## 2019-12-14 ENCOUNTER — Telehealth: Payer: Self-pay | Admitting: Orthopedic Surgery

## 2019-12-14 ENCOUNTER — Ambulatory Visit: Payer: Medicare HMO | Admitting: Orthopedic Surgery

## 2019-12-14 VITALS — BP 137/69 | HR 78

## 2019-12-14 DIAGNOSIS — R29898 Other symptoms and signs involving the musculoskeletal system: Secondary | ICD-10-CM

## 2019-12-14 DIAGNOSIS — M25562 Pain in left knee: Secondary | ICD-10-CM

## 2019-12-14 DIAGNOSIS — M25561 Pain in right knee: Secondary | ICD-10-CM

## 2019-12-14 DIAGNOSIS — G8929 Other chronic pain: Secondary | ICD-10-CM | POA: Diagnosis not present

## 2019-12-14 MED ORDER — PROMETHAZINE HCL 12.5 MG PO TABS: 12.5000 mg | ORAL_TABLET | Freq: Four times a day (QID) | ORAL | 0 refills | Status: DC | PRN

## 2019-12-14 MED ORDER — HYDROCODONE-ACETAMINOPHEN 5-325 MG PO TABS: 1.0000 | ORAL_TABLET | Freq: Four times a day (QID) | ORAL | 0 refills | Status: DC | PRN

## 2019-12-14 NOTE — Progress Notes (Signed)
Chief Complaint  Patient presents with  . Knee Pain    right knee pain, worse in the last few years    Encounter Diagnoses  Name Primary?  . Chronic pain of right knee Yes  . 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  Previously seen by neurology had a biopsy of her muscle tissue with some type of abnormality she is not sure what it was  We were aspirating her left knee for recurrent hemarthrosis comes in with bilateral knee pain which seems to be worsening however she is not a surgical candidate  I gave her 2 injections put her on hydrocodone and Phenergan  Neurology referral  History bilateral knee pain left worse than right history of hemarthrosis secondary to blood clots.  Blood clots have returned  Review of systems proximal weakness lower extremities and upper extremities  Patient comes in today in a wheelchair  Weakness in the proximal musculature of the shoulders and thighs as well as extension weakness bilaterally.  Tenderness and pain right knee tenderness pain and swelling left knee    xrays knees : oa both knees r > left see report    Procedure note for bilateral knee injections  Procedure note left knee injection verbal consent was obtained to inject left knee joint  Timeout was completed to confirm the site of injection  The medications used were 40 mg of Depo-Medrol and 1% lidocaine 3 cc  Anesthesia was provided by ethyl chloride and the skin was prepped with alcohol.  After cleaning the skin with alcohol a 20-gauge needle was used to inject the left knee joint. There were no complications. A sterile bandage was applied.   Procedure note right knee injection verbal consent was obtained to inject right knee joint  Timeout was completed to confirm the site of injection  The medications used were 40 mg of Depo-Medrol  and 1% lidocaine 3 cc  Anesthesia was provided by ethyl chloride and the skin was prepped with alcohol.  After cleaning the skin with alcohol a 20-gauge needle was used to inject the right knee joint. There were no complications. A sterile bandage was applied.

## 2019-12-14 NOTE — Patient Instructions (Addendum)
Schedule neurology consult   Take meds for pain   No orders of the defined types were placed in this encounter.

## 2019-12-14 NOTE — Addendum Note (Signed)
Addended byCandice Camp on: 12/14/2019 02:18 PM   Modules accepted: Orders

## 2019-12-14 NOTE — Telephone Encounter (Signed)
Call received from pharmacist at [facility], inquiring if patient has had surgery or if she has a new medical issue. States it is due to quantity of pills* *Per verbal from clinical assistant Amy, relayed that patient did not have surgery and that [name] does want patient to have the medication due to pain level. Pharmacist said she can therefore fill for 5 tablets rather than 7.

## 2019-12-14 NOTE — Telephone Encounter (Signed)
Thanks    I had to call her back let her know she forgot her knee brace in office today it is at front desk someone can pick it up

## 2019-12-26 DIAGNOSIS — I10 Essential (primary) hypertension: Secondary | ICD-10-CM | POA: Diagnosis not present

## 2019-12-27 LAB — BASIC METABOLIC PANEL WITH GFR
BUN/Creatinine Ratio: 49 (calc) — ABNORMAL HIGH (ref 6–22)
BUN: 37 mg/dL — ABNORMAL HIGH (ref 7–25)
CO2: 30 mmol/L (ref 20–32)
Calcium: 9.8 mg/dL (ref 8.6–10.4)
Chloride: 105 mmol/L (ref 98–110)
Creat: 0.75 mg/dL (ref 0.60–0.88)
GFR, Est African American: 84 mL/min/{1.73_m2} (ref >=60)
GFR, Est Non African American: 73 mL/min/{1.73_m2} (ref >=60)
Glucose, Bld: 101 mg/dL — ABNORMAL HIGH (ref 65–99)
Potassium: 5.1 mmol/L (ref 3.5–5.3)
Sodium: 142 mmol/L (ref 135–146)

## 2019-12-27 LAB — CBC
HCT: 39 % (ref 35.0–45.0)
Hemoglobin: 12.7 g/dL (ref 11.7–15.5)
MCH: 28.1 pg (ref 27.0–33.0)
MCHC: 32.6 g/dL (ref 32.0–36.0)
MCV: 86.3 fL (ref 80.0–100.0)
MPV: 11.1 fL (ref 7.5–12.5)
Platelets: 203 10*3/uL (ref 140–400)
RBC: 4.52 10*6/uL (ref 3.80–5.10)
RDW: 15.6 % — ABNORMAL HIGH (ref 11.0–15.0)
WBC: 6.7 10*3/uL (ref 3.8–10.8)

## 2020-01-01 ENCOUNTER — Ambulatory Visit (INDEPENDENT_AMBULATORY_CARE_PROVIDER_SITE_OTHER): Payer: Medicare HMO | Admitting: Family Medicine

## 2020-01-01 ENCOUNTER — Other Ambulatory Visit: Payer: Self-pay

## 2020-01-01 ENCOUNTER — Encounter: Payer: Self-pay | Admitting: Family Medicine

## 2020-01-01 VITALS — BP 134/76 | HR 72 | Resp 16 | Ht 66.0 in

## 2020-01-01 DIAGNOSIS — Z23 Encounter for immunization: Secondary | ICD-10-CM | POA: Diagnosis not present

## 2020-01-01 DIAGNOSIS — M1711 Unilateral primary osteoarthritis, right knee: Secondary | ICD-10-CM | POA: Diagnosis not present

## 2020-01-01 DIAGNOSIS — R531 Weakness: Secondary | ICD-10-CM | POA: Diagnosis not present

## 2020-01-01 DIAGNOSIS — M48061 Spinal stenosis, lumbar region without neurogenic claudication: Secondary | ICD-10-CM

## 2020-01-01 DIAGNOSIS — E785 Hyperlipidemia, unspecified: Secondary | ICD-10-CM | POA: Diagnosis not present

## 2020-01-01 DIAGNOSIS — G7249 Other inflammatory and immune myopathies, not elsewhere classified: Secondary | ICD-10-CM

## 2020-01-01 DIAGNOSIS — I10 Essential (primary) hypertension: Secondary | ICD-10-CM

## 2020-01-01 NOTE — Patient Instructions (Addendum)
Annual physical exam with MD end Feb or early March, call if you need me sooner  Flu vaccine today  TSH test to be done at hospiral with next lab draw, ( nurse please give order)  You are being referred for in home health/ through [facility], you do need  in assistance in the home  You are referred to occupational therapy for evaluation for power wheelchair , you do qualify  Thanks for choosing [facility], we consider it a privelige to serve you.

## 2020-01-01 NOTE — Progress Notes (Signed)
   [name] [name]     MRN: [id number]      DOB: March 19, 1933   HPI [name] [name] is here for follow up and re-evaluation of chronic medical conditions, medication management and review of any available recent lab and radiology data.  Preventive health is updated, specifically  Cancer screening and Immunization.   Questions or concerns regarding consultations or procedures which the [name] [name] has had in the interim are  addressed. The [name] [name] denies any adverse reactions to current medications since the last visit.  Pty in requesting  Referral for in home help / home health services through herinsurance which she states offers this .She is currently unable to weight bear due to severe arthritis of her rigth knee , inoperable, and she has recurrent left DVT, and she has neurologic disease which causes marked weakness of lower extremities,she also has bilateral weakness in the  handsand bilateral shoulder pain with reduced mobility  ROS Denies recent fever or chills. Denies sinus pressure, nasal congestion, ear pain or sore throat. Denies chest congestion, productive cough or wheezing. Denies chest pains, palpitations and doe  Have  leg swelling Denies abdominal pain, nausea, vomiting,diarrhea or constipation.   C/o  Incontinence. C/u generalized  joint pain,  And has marked  limitation in mobility. Denies headaches or  seizures, c/o numbness, and  tingling.  Denies skin break down or rash.   PE  BP 134/76   Pulse 72   Resp 16   Ht 5' 6"  (1.676 m)   SpO2 100%   BMI 24.86 kg/m    Patient alert and oriented and in no cardiopulmonary distress.  HEENT: No facial asymmetry, EOMI,     Neck decreased ROM  .  Chest: Clear to auscultation bilaterally.  CVS: S1, S2 no murmurs, no S3.Regular rate.  ABD: Soft non tender.   Ext: No edema  MS: markedly  Decreased  ROM spine, shoulders, hips and knees.  Skin: Intact, no ulcerations or rash noted.  Psych: Good eye contact, normal affect. Memory intact not  anxious or depressed appearing.  CNS: CN 2-12 intact, reduced power grade 3/5power in  in all extremities, reduced grip, grade CurrentJokes.cz tone in all 4 extremities  Assessment & Plan  Essential hypertension Controlled, no change in medication   Hyperlipidemia LDL goal <100 Hyperlipidemia:Low fat diet discussed and encouraged.   Lipid Panel  Lab Results  Component Value Date   CHOL 177 09/07/2019   HDL 57 09/07/2019   LDLCALC 106 (H) 09/07/2019   TRIG 68 09/07/2019   CHOLHDL 3.1 09/07/2019   Controlled, no change in medication     Lumbar spinal stenosis Marked limitation in mobility with high fall risk, needs evaluation for power wheelchair, will refer to OT  Inflammatory myopathy Upper and lower extremity muscle weakness preventing patient from effectively using a manual wheelchair, grade 3/5 upper extremity strength, and 3/5 grip , oT eval for pWC  Weakness generalized Incapable of performing ADL's, including cooking, bathing, toilet ing, needs in home help/ assistance , will reach out to [place] of right knee High fall risk and limited mobility, relies on assistive device for in home mobility, will best be served with power wheelchair which she is mentally capable of using in the confines of her home

## 2020-01-05 ENCOUNTER — Encounter: Payer: Self-pay | Admitting: Family Medicine

## 2020-01-05 ENCOUNTER — Telehealth: Payer: Self-pay | Admitting: Family Medicine

## 2020-01-05 DIAGNOSIS — M159 Polyosteoarthritis, unspecified: Secondary | ICD-10-CM

## 2020-01-05 DIAGNOSIS — G959 Disease of spinal cord, unspecified: Secondary | ICD-10-CM

## 2020-01-05 NOTE — Assessment & Plan Note (Signed)
High fall risk and limited mobility, relies on assistive device for in home mobility, will best be served with power wheelchair which she is mentally capable of using in the confines of her home

## 2020-01-05 NOTE — Assessment & Plan Note (Signed)
Hyperlipidemia:Low fat diet discussed and encouraged.   Lipid Panel  Lab Results  Component Value Date   CHOL 177 09/07/2019   HDL 57 09/07/2019   LDLCALC 106 (H) 09/07/2019   TRIG 68 09/07/2019   CHOLHDL 3.1 09/07/2019   Controlled, no change in medication

## 2020-01-05 NOTE — Assessment & Plan Note (Signed)
Incapable of performing ADL's, including cooking, bathing, toilet ing, needs in home help/ assistance , will reach out to [facility]

## 2020-01-05 NOTE — Assessment & Plan Note (Signed)
Controlled, no change in medication  

## 2020-01-05 NOTE — Assessment & Plan Note (Signed)
Marked limitation in mobility with high fall risk, needs evaluation for power wheelchair, will refer to OT

## 2020-01-05 NOTE — Telephone Encounter (Signed)
Can you please refer her to home health through her insurance [employer] When she came to visit she said that they had reached ou and that she does qualify for and should be able to therefore get in home help through the ins company, note is complete

## 2020-01-05 NOTE — Assessment & Plan Note (Signed)
Upper and lower extremity muscle weakness preventing patient from effectively using a manual wheelchair, grade 3/5 upper extremity strength, and 3/5 grip , oT eval for [facility]

## 2020-01-08 ENCOUNTER — Telehealth: Payer: Self-pay

## 2020-01-08 NOTE — Telephone Encounter (Signed)
States per AVS she was supposed to be referred for in home services through [facility].  Also a power wheelchair evaluation which she declined because when they called to schedule she said its supposed to be done at home.

## 2020-01-09 NOTE — Telephone Encounter (Signed)
[employer] does cover [facility] services. However, no facility that we refer to is able to bill to [facility], so therefor the services are not covered. Spoke with patient and explained this to her. She will call [facility] and ask for the exact facilities name that are able to bill straight to [facility] and their services are covered.

## 2020-01-09 NOTE — Telephone Encounter (Signed)
Thank you for follow through and update

## 2020-01-09 NOTE — Telephone Encounter (Signed)
Noted  

## 2020-01-22 ENCOUNTER — Ambulatory Visit (INDEPENDENT_AMBULATORY_CARE_PROVIDER_SITE_OTHER): Payer: Medicare HMO

## 2020-01-22 ENCOUNTER — Other Ambulatory Visit: Payer: Self-pay

## 2020-01-22 DIAGNOSIS — Z0000 Encounter for general adult medical examination without abnormal findings: Secondary | ICD-10-CM | POA: Diagnosis not present

## 2020-01-22 NOTE — Progress Notes (Signed)
Subjective:   [name] [name] is a 86 y.o. female who presents for Medicare Annual (Subsequent) preventive examination.     Objective:    There were no vitals filed for this visit. There is no height or weight on file to calculate BMI.  Advanced Directives 12/07/2019 10/18/2019 09/26/2019 09/19/2019 09/18/2019 09/11/2019 09/05/2019  Does Patient Have a Medical Advance Directive? Yes Yes Yes Yes Yes Yes Yes  Type of Paramedic of [place];Living will Living will;Healthcare Power of Attorney Living will [place];Living will Living will Living will [place];Living will  Does patient want to make changes to medical advance directive? No - Patient declined No - Patient declined No - Patient declined No - Patient declined No - Patient declined No - Patient declined No - Patient declined  Copy of [place] in Chart? No - copy requested - - No - copy requested - - No - copy requested  Would patient like information on creating a medical advance directive? No - Patient declined No - Patient declined - No - Patient declined - - No - Patient declined    Current Medications (verified) Outpatient Encounter Medications as of 01/22/2020  Medication Sig  . acetaminophen (TYLENOL) 650 MG CR tablet Take 650 mg by mouth every 6 (six) hours.   Marland Kitchen ELIQUIS 5 MG TABS tablet TAKE 1 TABLET TWICE DAILY  . ezetimibe (ZETIA) 10 MG tablet Take 1 tablet (10 mg total) by mouth daily.  . folic acid (FOLVITE) 1 MG tablet Take 1 tablet (1 mg total) by mouth daily.  . furosemide (LASIX) 20 MG tablet   . magnesium oxide (MAG-OX) 400 (241.3 Mg) MG tablet Take 1 tablet (400 mg total) by mouth 2 (two) times daily.  . NON FORMULARY Fluid restriction of 1.8 liters per day (1722m total) 7-3 = 800 ml 3-11 = 800 ml 11-7 = 175 ml Document totals qshift. Every Shift Day, Evening, Nigh  . NON FORMULARY Diet: _____ Regular, __x____  NAS, _______Consistent Carbohydrate, _______NPO _____Other  . pantoprazole (PROTONIX) 40 MG tablet Take 1 tablet (40 mg total) by mouth daily.  . potassium chloride SA (KLOR-CON) 20 MEQ tablet Take 1 tablet (20 mEq total) by mouth daily.  .Marland Kitchentriamterene-hydrochlorothiazide (MAXZIDE-25) 37.5-25 MG tablet Take 1 tablet by mouth daily.   No facility-administered encounter medications on file as of 01/22/2020.    Allergies (verified) Cellcept [mycophenolate mofetil], Statins, Ciprofloxacin hcl, Hydrocodone, and Penicillins   History: Past Medical History:  Diagnosis Date  . Arthritis    spinal stenosis  . Elevated LFTs    secondary to fatty liver, negative work-up in 2011  . Hypercholesterolemia   . Hypertension   . Lumbar spinal stenosis 01/14/2018   L3-4 and L4-5  . Myopathy 02/15/2018  . Osteoarthritis    right knee  . Peripheral neuropathy 07/06/2018  . PONV (postoperative nausea and vomiting)   . [place]   grade 1, no recurrence up to 2013   Past Surgical History:  Procedure Laterality Date  . 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 APPENDECTOMY  1973  . BREAST BIOPSY Left 2018   Benign  . CHOLECYSTECTOMY  1973  . COLONOSCOPY    06/21/2007   RNWG:NFAOZHrectum/Sigmoid diverticula, diminutive hepatic flexure polyp s/p bx. Benign.   . COLONOSCOPY N/A 10/17/2012   Dr. FOneida Alar moderate diverticulosis was noted in the sigmoid colon, moderate sized internal hemorrhoids. next tcs in10 years  .  ESOPHAGOGASTRODUODENOSCOPY N/A 03/20/2016   Dr. Oneida Alar, widely patent Schatzki ring, anemia due to ASA induced erosive gastritis, mild duodenitis. gastric bx benign without H.pylori.   Marland Kitchen FOOT SURGERY  2007   Pins in toes on left foot, 5 to 6 yrs ago  . GIVENS CAPSULE STUDY N/A 03/20/2016   Procedure: GIVENS CAPSULE STUDY;  Surgeon: Danie Binder, MD;  [facility];  Service: Endoscopy;  Laterality: N/A;  . MUSCLE BIOPSY Left 06/07/2018    Procedure: LEFT THIGH MUSCLE BIOPSY;  Surgeon: Erroll Luna, MD;  Location: [facility];  Service: General;  Laterality: Left;  . TONSILLECTOMY     Family History  Problem Relation Age of Onset  . Heart disease Father   . Bladder Cancer Brother        in remission   . Hypertension Sister   . Dementia Sister   . Diabetes type II Sister   . Hypertension Sister   . Aneurysm Sister   . Hypertension Brother   . Arthritis Brother   . Hypertension Son   . Colon cancer Neg Hx    Social History   Socioeconomic History  . Marital status: Married    Spouse name: Jeneen Rinks   . Number of children: 3  . Years of education: 56  . Highest education level: 12th grade  Occupational History  . Occupation: retired   Tobacco Use  . Smoking status: Never Smoker  . Smokeless tobacco: Never Used  . Tobacco comment: Never smoked  Vaping Use  . Vaping Use: Never used  Substance and Sexual Activity  . Alcohol use: No  . Drug use: No  . Sexual activity: Not Currently    Birth control/protection: Post-menopausal, Surgical  Other Topics Concern  . Not on file  Social History Narrative   Husband Jeneen Rinks recently passed away   Caffeine use: soda daily   Right handed    Social Determinants of Health   Financial Resource Strain:   . Difficulty of Paying Living Expenses: Not on file  Food Insecurity:   . Worried About Charity fundraiser in the Last Year: Not on file  . Ran Out of Food in the Last Year: Not on file  Transportation Needs:   . Lack of Transportation (Medical): Not on file  . Lack of Transportation (Non-Medical): Not on file  Physical Activity:   . Days of Exercise per Week: Not on file  . Minutes of Exercise per Session: Not on file  Stress:   . Feeling of Stress : Not on file  Social Connections: Unknown  . Frequency of Communication with Friends and Family: Not on file  . Frequency of Social Gatherings with Friends and Family: Not on file  . Attends Religious  Services: Not on file  . Active Member of Clubs or Organizations: No  . Attends Archivist Meetings: Not on file  . Marital Status: Widowed    Tobacco Counseling Counseling given: Not Answered Comment: Never smoked                  Diabetic? Pre-diabetes          Activities of Daily Living In your present state of health, do you have any difficulty performing the following activities: 08/05/2019 08/05/2019  Hearing? N -  Vision? N -  Difficulty concentrating or making decisions? N -  Walking or climbing stairs? Y -  Dressing or bathing? Y -  Doing errands, shopping? Tempie Donning  Some recent  data might be hidden    Patient Care Team: Fayrene Helper, MD as PCP - General Carole Civil, MD as Consulting Physician (Orthopedic Surgery) Irine Seal, MD as Attending Physician (Urology) Danie Binder, MD (Inactive) as Consulting Physician (Gastroenterology) Jovita Gamma, MD as Consulting Physician (Neurosurgery) Derek Jack, MD as Consulting Physician (Hematology)  Indicate any recent Medical Services you may have received from other than [facility] providers in the past year (date may be approximate).     Assessment:   This is a routine wellness examination for [place].  Hearing/Vision screen No exam data present  Dietary issues and exercise activities discussed:    Goals    . DIET - INCREASE WATER INTAKE    . Exercise 3x per week (30 min per time)     Recommend starting a routine exercise program at least 3 days a week for 30-45 minutes at a time as tolerated.        Depression Screen PHQ 2/9 Scores 01/01/2020 05/15/2019 05/15/2019 04/03/2019 01/18/2019 08/11/2018 05/03/2018  PHQ - 2 Score 0 2 2 1 1 1 2   PHQ- 9 Score - 6 7 - - - 7    Fall Risk Fall Risk  01/01/2020 11/01/2019 05/15/2019 04/03/2019 03/22/2019  Falls in the past year? 1 0 1 1 1   Number falls in past yr: 1 0 1 1 1   Injury with Fall? 1 0 1 1 1   Comment - - - - -  Risk for  fall due to : - - - - -  Follow up - - - - -    Any stairs in or around the home? No  If so, are there any without handrails? No  Home free of loose throw rugs in walkways, pet beds, electrical cords, etc? Yes  Adequate lighting in your home to reduce risk of falls? Yes   ASSISTIVE DEVICES UTILIZED TO PREVENT FALLS:  Life alert? Yes  Use of a cane, walker or w/c? Yes  Grab bars in the bathroom? Yes  Shower chair or bench in shower? Yes  Elevated toilet seat or a handicapped toilet? Yes   TIMED UP AND GO:  Was the test performed? No .       6CIT Screen 01/18/2019 01/12/2018 09/28/2016  What Year? 0 points 0 points 0 points  What month? 0 points 0 points 0 points  What time? 0 points 0 points 0 points  Count back from 20 0 points 0 points 0 points  Months in reverse 2 points 0 points 0 points  Repeat phrase 2 points 0 points 0 points  Total Score 4 0 0    Immunizations Immunization History  Administered Date(s) Administered  . Fluad Quad(high Dose 65+) 01/09/2019, 01/01/2020  . Influenza Split 12/22/2012, 01/04/2014, 12/13/2014  . Influenza Whole 01/21/2007, 05/10/2008, 12/11/2008, 12/06/2009  . Influenza, High Dose Seasonal PF 12/30/2017  . Influenza,inj,Quad PF,6+ Mos 04/22/2017  . Influenza-Unspecified 11/21/2013, 12/20/2015  . Moderna SARS-COVID-2 Vaccination 05/17/2019, 06/14/2019  . Pneumococcal Conjugate-13 11/02/2013  . Pneumococcal Polysaccharide-23 08/15/2010  . Td 08/31/2007  . Zoster 08/31/2006    TDAP status: Due, Education has been provided regarding the importance of this vaccine. Advised may receive this vaccine at local pharmacy or Health Dept. Aware to provide a copy of the vaccination record if obtained from local pharmacy or Health Dept. Verbalized acceptance and understanding. Flu Vaccine status: Up to date Pneumococcal vaccine status: Up to date Covid-19 vaccine status: Completed vaccines  Qualifies for Shingles Vaccine? Yes  Zostavax  completed No   Shingrix Completed?: No.    Education has been provided regarding the importance of this vaccine. Patient has been advised to call insurance company to determine out of pocket expense if they have not yet received this vaccine. Advised may also receive vaccine at local pharmacy or Health Dept. Verbalized acceptance and understanding.  Screening Tests Health Maintenance  Topic Date Due  . TETANUS/TDAP  08/30/2017  . INFLUENZA VACCINE  Completed  . DEXA SCAN  Completed  . COVID-19 Vaccine  Completed  . PNA vac Low Risk Adult  Completed    Health Maintenance  Health Maintenance Due  Topic Date Due  . TETANUS/TDAP  08/30/2017    Colorectal cancer screening: No longer required.  Mammogram status: No longer required.  Bone Density status: Completed normal. Results reflect: Bone density results: NORMAL. Repeat every 0 years.  Lung Cancer Screening: (Low Dose CT Chest recommended if Age 47-80 years, 30 pack-year currently smoking OR have quit w/in 15years.) does not qualify.    Additional Screening:  Hepatitis C Screening: does qualify; Completed.  Vision Screening: Recommended annual ophthalmology exams for early detection of glaucoma and other disorders of the eye. Is the patient up to date with their annual eye exam?  Yes    Who is the provider or what is the name of the office in which the patient attends annual eye exams? Dr. Jorja Loa  If pt is not established with a provider, would they like to be referred to a provider to establish care? No .   Dental Screening: Recommended annual dental exams for proper oral hygiene  Community Resource Referral / Chronic Care Management: CRR required this visit?  No   CCM required this visit?  No      Plan:     I have personally reviewed and noted the following in the patient's chart:   . Medical and social history . Use of alcohol, tobacco or illicit drugs  . Current medications and supplements . Functional ability  and status . Nutritional status . Physical activity . Advanced directives . List of other physicians . Hospitalizations, surgeries, and ER visits in previous 12 months . Vitals . Screenings to include cognitive, depression, and falls . Referrals and appointments  In addition, I have reviewed and discussed with patient certain preventive protocols, quality metrics, and best practice recommendations. A written personalized care plan for preventive services as well as general preventive health recommendations were provided to patient.     Lonn Georgia, LPN   [id number]   Nurse Notes: AWV conducted over the phone with pt consent to televisit via audio. Pt in her home at the time of the visit with provider in the office. Phone call took approx 62mn. Pt verbalizes understanding of wellness visit and consents to receiving a Tetanus shot at her next visit.

## 2020-01-22 NOTE — Patient Instructions (Addendum)
[name] [name] , Thank you for taking time to come for your Medicare Wellness Visit. I appreciate your ongoing commitment to your health goals. Please review the following plan we discussed and let me know if I can assist you in the future.   Screening recommendations/referrals: Colonoscopy: NO LONGER REQUIRED  Mammogram: NO LONGER REQUIRED  Bone Density: COMPLETE   Recommended yearly ophthalmology/optometry visit for glaucoma screening and checkup Recommended yearly dental visit for hygiene and checkup  Vaccinations: Influenza vaccine: COMPLETE  Pneumococcal vaccine: COMPLETE  Tdap vaccine: DUE; EDUCATION PROVIDED  Shingles vaccine: EDUCATION PROVIDED WITH AVS.   Advanced directives: N/A   Conditions/risks identified: PT HAVING A HARD TIME WITH MOBILITY AT THIS TIME DUE TO BLOOD CLOT, PT USING W/C BUT DOING WELL.   Next appointment: 05/20/2020 @ 1:00 PM WITH DR. Moshe Cipro    Preventive Care 65 Years and Older, Female Preventive care refers to lifestyle choices and visits with your health care provider that can promote health and wellness. What does preventive care include?  A yearly physical exam. This is also called an annual well check.  Dental exams once or twice a year.  Routine eye exams. Ask your health care provider how often you should have your eyes checked.  Personal lifestyle choices, including:  Daily care of your teeth and gums.  Regular physical activity.  Eating a healthy diet.  Avoiding tobacco and drug use.  Limiting alcohol use.  Practicing safe sex.  Taking low-dose aspirin every day.  Taking vitamin and mineral supplements as recommended by your health care provider. What happens during an annual well check? The services and screenings done by your health care provider during your annual well check will depend on your age, overall health, lifestyle risk factors, and family history of disease. Counseling  Your health care provider may ask you  questions about your:  Alcohol use.  Tobacco use.  Drug use.  Emotional well-being.  Home and relationship well-being.  Sexual activity.  Eating habits.  History of falls.  Memory and ability to understand (cognition).  Work and work Statistician.  Reproductive health. Screening  You may have the following tests or measurements:  Height, weight, and BMI.  Blood pressure.  Lipid and cholesterol levels. These may be checked every 5 years, or more frequently if you are over 84 years old.  Skin check.  Lung cancer screening. You may have this screening every year starting at age 84 if you have a 30-pack-year history of smoking and currently smoke or have quit within the past 15 years.  Fecal occult blood test (FOBT) of the stool. You may have this test every year starting at age 84.  Flexible sigmoidoscopy or colonoscopy. You may have a sigmoidoscopy every 5 years or a colonoscopy every 10 years starting at age 84.  Hepatitis C blood test.  Hepatitis B blood test.  Sexually transmitted disease (STD) testing.  Diabetes screening. This is done by checking your blood sugar (glucose) after you have not eaten for a while (fasting). You may have this done every 1-3 years.  Bone density scan. This is done to screen for osteoporosis. You may have this done starting at age 84.  Mammogram. This may be done every 1-2 years. Talk to your health care provider about how often you should have regular mammograms. Talk with your health care provider about your test results, treatment options, and if necessary, the need for more tests. Vaccines  Your health care provider may recommend certain vaccines, such as:  Influenza vaccine. This is recommended every year.  Tetanus, diphtheria, and acellular pertussis (Tdap, Td) vaccine. You may need a Td booster every 10 years.  Zoster vaccine. You may need this after age 84.  Pneumococcal 13-valent conjugate (PCV13) vaccine. One dose is  recommended after age 84.  Pneumococcal polysaccharide (PPSV23) vaccine. One dose is recommended after age 84. Talk to your health care provider about which screenings and vaccines you need and how often you need them. This information is not intended to replace advice given to you by your health care provider. Make sure you discuss any questions you have with your health care provider. Document Released: 04/05/2015 Document Revised: 11/27/2015 Document Reviewed: 01/08/2015 Elsevier Interactive Patient Education  2017 [place] Prevention in the Home Falls can cause injuries. They can happen to people of all ages. There are many things you can do to make your home safe and to help prevent falls. What can I do on the outside of my home?  Regularly fix the edges of walkways and driveways and fix any cracks.  Remove anything that might make you trip as you walk through a door, such as a raised step or threshold.  Trim any bushes or trees on the path to your home.  Use bright outdoor lighting.  Clear any walking paths of anything that might make someone trip, such as rocks or tools.  Regularly check to see if handrails are loose or broken. Make sure that both sides of any steps have handrails.  Any raised decks and porches should have guardrails on the edges.  Have any leaves, snow, or ice cleared regularly.  Use sand or salt on walking paths during winter.  Clean up any spills in your garage right away. This includes oil or grease spills. What can I do in the bathroom?  Use night lights.  Install grab bars by the toilet and in the tub and shower. Do not use towel bars as grab bars.  Use non-skid mats or decals in the tub or shower.  If you need to sit down in the shower, use a plastic, non-slip stool.  Keep the floor dry. Clean up any water that spills on the floor as soon as it happens.  Remove soap buildup in the tub or shower regularly.  Attach bath mats  securely with double-sided non-slip rug tape.  Do not have throw rugs and other things on the floor that can make you trip. What can I do in the bedroom?  Use night lights.  Make sure that you have a light by your bed that is easy to reach.  Do not use any sheets or blankets that are too big for your bed. They should not hang down onto the floor.  Have a firm chair that has side arms. You can use this for support while you get dressed.  Do not have throw rugs and other things on the floor that can make you trip. What can I do in the kitchen?  Clean up any spills right away.  Avoid walking on wet floors.  Keep items that you use a lot in easy-to-reach places.  If you need to reach something above you, use a strong step stool that has a grab bar.  Keep electrical cords out of the way.  Do not use floor polish or wax that makes floors slippery. If you must use wax, use non-skid floor wax.  Do not have throw rugs and other things on the floor that  can make you trip. What can I do with my stairs?  Do not leave any items on the stairs.  Make sure that there are handrails on both sides of the stairs and use them. Fix handrails that are broken or loose. Make sure that handrails are as long as the stairways.  Check any carpeting to make sure that it is firmly attached to the stairs. Fix any carpet that is loose or worn.  Avoid having throw rugs at the top or bottom of the stairs. If you do have throw rugs, attach them to the floor with carpet tape.  Make sure that you have a light switch at the top of the stairs and the bottom of the stairs. If you do not have them, ask someone to add them for you. What else can I do to help prevent falls?  Wear shoes that:  Do not have high heels.  Have rubber bottoms.  Are comfortable and fit you well.  Are closed at the toe. Do not wear sandals.  If you use a stepladder:  Make sure that it is fully opened. Do not climb a closed  stepladder.  Make sure that both sides of the stepladder are locked into place.  Ask someone to hold it for you, if possible.  Clearly mark and make sure that you can see:  Any grab bars or handrails.  First and last steps.  Where the edge of each step is.  Use tools that help you move around (mobility aids) if they are needed. These include:  Canes.  Walkers.  Scooters.  Crutches.  Turn on the lights when you go into a dark area. Replace any light bulbs as soon as they burn out.  Set up your furniture so you have a clear path. Avoid moving your furniture around.  If any of your floors are uneven, fix them.  If there are any pets around you, be aware of where they are.  Review your medicines with your doctor. Some medicines can make you feel dizzy. This can increase your chance of falling. Ask your doctor what other things that you can do to help prevent falls. This information is not intended to replace advice given to you by your health care provider. Make sure you discuss any questions you have with your health care provider. Document Released: 01/03/2009 Document Revised: 08/15/2015 Document Reviewed: 04/13/2014 Elsevier Interactive Patient Education  2017 [employer].

## 2020-01-24 ENCOUNTER — Telehealth: Payer: Self-pay

## 2020-01-24 NOTE — Telephone Encounter (Signed)
Needs to consider a family home I also recommend she see if [employer] will cover in home help several hours/ day. Please also reach out to the social worker to see if they can assist this patient

## 2020-01-24 NOTE — Telephone Encounter (Signed)
Patient called stating the [facility] gave her did not accept [employer]. None of the facilities around our area accept this insurance because they are unable to directly bill [employer]. Patient states she is unable to do it alone. She has to have help. Please advise

## 2020-01-30 ENCOUNTER — Other Ambulatory Visit: Payer: Self-pay

## 2020-01-30 ENCOUNTER — Inpatient Hospital Stay (HOSPITAL_COMMUNITY): Payer: Medicare HMO | Admitting: Hematology

## 2020-01-30 ENCOUNTER — Inpatient Hospital Stay (HOSPITAL_COMMUNITY): Payer: Medicare HMO | Attending: Hematology

## 2020-01-30 VITALS — BP 123/58 | HR 71 | Temp 97.0°F | Resp 16

## 2020-01-30 DIAGNOSIS — I82412 Acute embolism and thrombosis of left femoral vein: Secondary | ICD-10-CM

## 2020-01-30 DIAGNOSIS — D649 Anemia, unspecified: Secondary | ICD-10-CM

## 2020-01-30 DIAGNOSIS — Z86718 Personal history of other venous thrombosis and embolism: Secondary | ICD-10-CM | POA: Diagnosis not present

## 2020-01-30 DIAGNOSIS — Z8542 Personal history of malignant neoplasm of other parts of uterus: Secondary | ICD-10-CM | POA: Diagnosis not present

## 2020-01-30 LAB — BASIC METABOLIC PANEL
Anion gap: 11 (ref 5–15)
BUN: 29 mg/dL — ABNORMAL HIGH (ref 8–23)
CO2: 27 mmol/L (ref 22–32)
Calcium: 9.4 mg/dL (ref 8.9–10.3)
Chloride: 103 mmol/L (ref 98–111)
Creatinine, Ser: 0.69 mg/dL (ref 0.44–1.00)
GFR, Estimated: >60 mL/min (ref >60)
Glucose, Bld: 96 mg/dL (ref 70–99)
Potassium: 4.3 mmol/L (ref 3.5–5.1)
Sodium: 141 mmol/L (ref 135–145)

## 2020-01-30 LAB — MAGNESIUM: Magnesium: 1.9 mg/dL (ref 1.7–2.4)

## 2020-01-30 LAB — D-DIMER, QUANTITATIVE: D-Dimer, Quant: 0.72 ug/mL-FEU — ABNORMAL HIGH (ref 0.00–0.50)

## 2020-01-30 MED ORDER — FOLIC ACID 1 MG PO TABS
1.0000 mg | ORAL_TABLET | Freq: Every day | ORAL | 6 refills | Status: DC
Start: 2020-01-30 — End: 2020-03-14

## 2020-01-30 NOTE — Patient Instructions (Signed)
[place] at [facility] Discharge Instructions  You were seen today by Dr. Delton Coombes. He went over your recent results. Stop taking the potassium tablets but continue taking magnesium. Your next appointment will be with the nurse practitioner in 4 months for labs and follow up.   Thank you for choosing [place] at [facility] to provide your oncology and hematology care.  To afford each patient quality time with our provider, please arrive at least 15 minutes before your scheduled appointment time.   If you have a lab appointment with the [facility] please come in thru the Main Entrance and check in at the main information desk  You need to re-schedule your appointment should you arrive 10 or more minutes late.  We strive to give you quality time with our providers, and arriving late affects you and other patients whose appointments are after yours.  Also, if you no show three or more times for appointments you may be dismissed from the clinic at the providers discretion.     Again, thank you for choosing [facility].  Our hope is that these requests will decrease the amount of time that you wait before being seen by our physicians.       _____________________________________________________________  Should you have questions after your visit to [facility], please contact our office at [phone] between the hours of 8:00 a.m. and 4:30 p.m.  Voicemails left after 4:00 p.m. will not be returned until the following business day.  For prescription refill requests, have your pharmacy contact our office and allow 72 hours.    Cancer Center Support Programs:   > Cancer Support Group  2nd Tuesday of the month 1pm-2pm, Journey Room

## 2020-01-30 NOTE — Progress Notes (Signed)
[name] [name], [place] [address]   CLINIC:  Medical Oncology/Hematology  PCP:  [name] Helper, MD [address] / [place] [address]  [phone]  REASON FOR VISIT:  Follow-up for normocytic anemia & DVT of left femoral vein  PRIOR THERAPY: None  CURRENT THERAPY: Eliquis daily; iron tablet twice weekly  INTERVAL HISTORY:  [name], a 86 y.o. female, returns for routine follow-up for her normocytic anemia and DVT of left femoral vein. [name] [name] was last seen on 12/07/2019.  Today she is accompanied by her daughter and she reports feeling okay. She complains of ongoing pain in her knees, the right knee more than the left knee, and reports that the steroid injections done by Dr. Koleen [name] are not helping. She was also referred to a neurologist for her knee pain which she will see on 12/26. She continues taking tramadol. She denies having hematochezia or hematuria and her vaginal bleeding has resolved. She continues taking Eliquis daily. She is taking iron tablets twice a week and denies having constipation. She has been out of potassium for 1 week but continues taking magnesium. She is not able to stand for too long due to weakness and pain in her knees.    REVIEW OF SYSTEMS:  Review of Systems  Constitutional: Positive for appetite change (75%) and fatigue (depleted).  HENT:   Positive for trouble swallowing (teeth pain).   Respiratory: Positive for cough.   Gastrointestinal: Negative for blood in stool and constipation.  Genitourinary: Positive for bladder incontinence. Negative for hematuria.   Musculoskeletal: Positive for arthralgias (10/10 knees pain R > L).  Neurological: Positive for dizziness (occasional) and numbness (feet).  Psychiatric/Behavioral: Positive for depression and sleep disturbance. The patient is nervous/anxious.   All other systems reviewed and are negative.   PAST MEDICAL/SURGICAL HISTORY:  Past  Medical History:  Diagnosis Date  . Arthritis    spinal stenosis  . Elevated LFTs    secondary to fatty liver, negative work-up in 2011  . Hypercholesterolemia   . Hypertension   . Lumbar spinal stenosis 01/14/2018   L3-4 and L4-5  . Myopathy 02/15/2018  . Osteoarthritis    right knee  . Peripheral neuropathy 07/06/2018  . PONV (postoperative nausea and vomiting)   . Uterine cancer ([place]) 08/2006   grade 1, no recurrence up to 2013   Past Surgical History:  Procedure Laterality Date  . 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 APPENDECTOMY  1973  . BREAST BIOPSY Left 2018   Benign  . CHOLECYSTECTOMY  1973  . COLONOSCOPY    06/21/2007   TJQ:[id number] rectum/Sigmoid diverticula, diminutive hepatic flexure polyp s/p bx. Benign.   . COLONOSCOPY N/A 10/17/2012   Dr. Oneida [name]- moderate diverticulosis was noted in the sigmoid colon, moderate sized internal hemorrhoids. next tcs in10 years  . ESOPHAGOGASTRODUODENOSCOPY N/A 03/20/2016   Dr. Oneida [name], widely patent Schatzki ring, anemia due to ASA induced erosive gastritis, mild duodenitis. gastric bx benign without H.pylori.   Marland Kitchen FOOT SURGERY  2007   Pins in toes on left foot, 5 to 6 yrs ago  . GIVENS CAPSULE STUDY N/A 03/20/2016   Procedure: GIVENS CAPSULE STUDY;  Surgeon: [name] Binder, MD;  [facility];  Service: Endoscopy;  Laterality: N/A;  . MUSCLE BIOPSY Left 06/07/2018   Procedure: LEFT THIGH MUSCLE BIOPSY;  Surgeon: [name] Luna, MD;  Location: [facility];  Service: General;  Laterality: Left;  . TONSILLECTOMY      SOCIAL HISTORY:  Social History   Socioeconomic History  . Marital status: Married    Spouse name: [name] [name]   . Number of children: 3  . Years of education: 12  . Highest education level: 12th grade  Occupational History  . Occupation: retired   Tobacco Use  . Smoking status: Never Smoker  . Smokeless tobacco: Never Used  . Tobacco comment: Never smoked  Vaping Use  .  Vaping Use: Never used  Substance and Sexual Activity  . Alcohol use: No  . Drug use: No  . Sexual activity: Not Currently    Birth control/protection: Post-menopausal, Surgical  Other Topics Concern  . Not on file  Social History Narrative   Husband [name] [name] recently passed away   Caffeine use: soda daily   Right handed    Social Determinants of Health   Financial Resource Strain: Low Risk   . Difficulty of Paying Living Expenses: Not very hard  Food Insecurity: No Food Insecurity  . Worried About Charity fundraiser in the Last Year: Never true  . Ran Out of Food in the Last Year: Never true  Transportation Needs: No Transportation Needs  . Lack of Transportation (Medical): No  . Lack of Transportation (Non-Medical): No  Physical Activity: Inactive  . Days of Exercise per Week: 0 days  . Minutes of Exercise per Session: 0 min  Stress: Stress Concern Present  . Feeling of Stress : To some extent  Social Connections: Socially Isolated  . Frequency of Communication with Friends and Family: More than three times a week  . Frequency of Social Gatherings with Friends and Family: Once a week  . Attends Religious Services: Never  . Active Member of Clubs or Organizations: No  . Attends Archivist Meetings: Never  . Marital Status: Widowed  Intimate Partner Violence: Not At Risk  . Fear of Current or Ex-Partner: No  . Emotionally Abused: No  . Physically Abused: No  . Sexually Abused: No    FAMILY HISTORY:  Family History  Problem Relation Age of Onset  . Heart disease Father   . Bladder Cancer Brother        in remission   . Hypertension Sister   . Dementia Sister   . Diabetes type II Sister   . Hypertension Sister   . Aneurysm Sister   . Hypertension Brother   . Arthritis Brother   . Hypertension Son   . Colon cancer Neg Hx     CURRENT MEDICATIONS:  Current Outpatient Medications  Medication Sig Dispense Refill  . acetaminophen (TYLENOL) 650 MG CR  tablet Take 650 mg by mouth every 6 (six) hours.     Marland Kitchen ELIQUIS 5 MG TABS tablet TAKE 1 TABLET TWICE DAILY 180 tablet 2  . ezetimibe (ZETIA) 10 MG tablet Take 1 tablet (10 mg total) by mouth daily. 30 tablet 0  . folic acid (FOLVITE) 1 MG tablet Take 1 tablet (1 mg total) by mouth daily. (Patient not taking: Reported on 01/22/2020) 30 tablet 0  . furosemide (LASIX) 20 MG tablet     . magnesium oxide (MAG-OX) 400 (241.3 Mg) MG tablet Take 1 tablet (400 mg total) by mouth 2 (two) times daily. 180 tablet 1  . NON FORMULARY Fluid restriction of 1.8 liters per day (1729m total) 7-3 = 800 ml 3-11 = 800 ml 11-7 = 175 ml Document totals qshift. Every Shift Day, Evening, Nigh    .  NON FORMULARY Diet: _____ Regular, __x____ NAS, _______Consistent Carbohydrate, _______NPO _____Other    . pantoprazole (PROTONIX) 40 MG tablet Take 1 tablet (40 mg total) by mouth daily. (Patient not taking: Reported on 01/22/2020) 30 tablet 0  . potassium chloride SA (KLOR-CON) 20 MEQ tablet Take 1 tablet (20 mEq total) by mouth daily. (Patient not taking: Reported on 01/22/2020) 30 tablet 0  . triamterene-hydrochlorothiazide (MAXZIDE-25) 37.5-25 MG tablet Take 1 tablet by mouth daily. 90 tablet 3   No current facility-administered medications for this visit.    ALLERGIES:  Allergies  Allergen Reactions  . Cellcept [Mycophenolate Mofetil]     Muscle cramps, mouth soreness, difficulty swallowing, bruising  . Statins     Dx of in  . Ciprofloxacin Hcl Other (See Comments)    Chills, sick, could not tolerate it  . Hydrocodone Nausea Only  . Penicillins Rash    PHYSICAL EXAM:  Performance status (ECOG): 2 - Symptomatic, <50% confined to bed  Vitals:   01/30/20 1142  BP: (!) 123/58  Pulse: 71  Resp: 16  Temp: (!) 97 F (36.1 C)  SpO2: 100%   Wt Readings from Last 3 Encounters:  11/01/19 154 lb (69.9 kg)  09/26/19 154 lb 6.4 oz (70 kg)  09/18/19 154 lb 6.4 oz (70 kg)   Physical Exam Vitals reviewed.   Constitutional:      Appearance: Normal appearance.     Comments: Wheelchair-bound  Cardiovascular:     Rate and Rhythm: Normal rate and regular rhythm.     Pulses: Normal pulses.     Heart sounds: Normal heart sounds.  Pulmonary:     Effort: Pulmonary effort is normal.     Breath sounds: Normal breath sounds.  Musculoskeletal:     Right knee: Bony tenderness present.     Left knee: Bony tenderness present.     Right lower leg: Edema (2+) present.     Left lower leg: Edema (2+) present.  Neurological:     General: No focal deficit present.     Mental Status: She is alert and oriented to person, place, and time.  Psychiatric:        Mood and Affect: Mood normal.        Behavior: Behavior normal.     LABORATORY DATA:  I have reviewed the labs as listed.  CBC Latest Ref Rng & Units 12/26/2019 12/07/2019 11/13/2019  WBC 3.8 - 10.8 Thousand/uL 6.7 5.6 5.2  Hemoglobin 11.7 - 15.5 g/dL 12.7 12.2 12.0  Hematocrit 35 - 45 % 39.0 40.3 39.0  Platelets 140 - 400 Thousand/uL 203 204 202   CMP Latest Ref Rng & Units 01/30/2020 12/26/2019 12/07/2019  Glucose 70 - 99 mg/dL 96 101(H) 96  BUN 8 - 23 mg/dL 29(H) 37(H) 28(H)  Creatinine 0.44 - 1.00 mg/dL 0.69 0.75 0.76  Sodium 135 - 145 mmol/L 141 142 139  Potassium 3.5 - 5.1 mmol/L 4.3 5.1 3.0(L)  Chloride 98 - 111 mmol/L 103 105 104  CO2 22 - 32 mmol/L 27 30 25   Calcium 8.9 - 10.3 mg/dL 9.4 9.8 9.9  Total Protein 6.5 - 8.1 g/dL - - 6.4(L)  Total Bilirubin 0.3 - 1.2 mg/dL - - 0.8  Alkaline Phos 38 - 126 U/L - - 40  AST 15 - 41 U/L - - 55(H)  ALT 0 - 44 U/L - - 35      Component Value Date/Time   RBC 4.52 12/26/2019 1156   MCV 86.3 12/26/2019 1156   MCV  88 10/24/2018 1520   MCH 28.1 12/26/2019 1156   MCHC 32.6 12/26/2019 1156   RDW 15.6 (H) 12/26/2019 1156   RDW 13.2 10/24/2018 1520   LYMPHSABS 1.5 12/07/2019 1001   LYMPHSABS 0.7 10/24/2018 1520   MONOABS 0.5 12/07/2019 1001   EOSABS 0.1 12/07/2019 1001   EOSABS 0.1 10/24/2018  1520   BASOSABS 0.0 12/07/2019 1001   BASOSABS 0.0 10/24/2018 1520    DIAGNOSTIC IMAGING:  I have independently reviewed the scans and discussed with the patient. No results found.   ASSESSMENT:  1. Acute deep vein thrombosis (DVT) of left femoral vein (HCC) Weakly provoked left leg DVT: -Status post fall prior to presentation to the ER on 02/05/2019 with left leg swelling. Doppler showed occlusive DVT extending from proximal left femoral vein through left popliteal vein. -He was started on Eliquis since then. Eliquis was discontinued around 06/06/2019. D-dimer was 1.42 on same day. -I have recommended restarting Eliquis on 06/13/2019. She has been taking since then. -Ultrasound of the leg on 08/05/2019 showed occlusive DVT involving left popliteal vein with extension to left posterior tibial vein and one of the paired left peroneal veins similar to 01/2019 examination. -Ultrasound on 08/23/2019 showed no evidence of acute or chronic DVT within the either lower extremity with special attention paid to the left popliteal and tibial veins. There is a 16.6 x 3.9 x 3.5 cm complex fluid collection extending from the left popliteal fossa to the proximal/mid aspect of the left calf, not seen on 08/05/2019. - Discontinued Eliquis anticoagulation with Eliquis on 09/05/2019. - Bilateral lower extremity US on 09/18/2019 negative for DVT.  2. Hemarthrosis involving knee joint, left -S/P aspiration and depo-medrol injection by Dr. Aline Brochure on 09/08/2019 with 40 cc of aspirated blood.  3. Normocytic anemia -Anemia of chronic disease. Renal function is stable without obvious signs of renal dysfunction. Will recheck labs in 4 weeks: CBC diff, CMET, iron/TIBC, ferritin, B12, MMA, and folate.  4. Endometrial adenocarcinoma: -CT scan on 03/06/2019 did not show any evidence of recurrence or metastatic disease.   PLAN:  1. Weakly provoked left leg DVT: -Eliquis was started back based on Doppler report  from 11/13/2019. -She is tolerating Eliquis without any major problems. -D-dimer is still elevated at 0.72.  However it is decreased from 2.5. -RTC 4 months with repeat labs.  2.  Right knee pain: -Continue tramadol 50 mg every 6 hours as needed.  3.Normocytic anemia: -Latest hemoglobin is 12.7. -Continue iron tablet twice weekly.  She cannot take more than that because of constipation.  4.  Hypomagnesemia: -She will continue magnesium twice daily.  Latest magnesium is 1.9. -She stopped taking potassium 1 week ago as she ran out of them.  Her magnesium is normal today.  She does not have to go on potassium.  Orders placed this encounter:  No orders of the defined types were placed in this encounter.    Derek Jack, MD [place] [phone]   I, Milinda Antis, am acting as a scribe for Dr. Sanda Linger.  I, Derek Jack MD, have reviewed the above documentation for accuracy and completeness, and I agree with the above.

## 2020-01-31 ENCOUNTER — Ambulatory Visit (HOSPITAL_COMMUNITY): Payer: Medicare HMO | Admitting: Hematology

## 2020-01-31 ENCOUNTER — Other Ambulatory Visit (HOSPITAL_COMMUNITY): Payer: Medicare HMO

## 2020-01-31 ENCOUNTER — Other Ambulatory Visit: Payer: Self-pay

## 2020-01-31 NOTE — Telephone Encounter (Signed)
Referred to [facility]

## 2020-02-02 ENCOUNTER — Other Ambulatory Visit: Payer: Self-pay

## 2020-02-02 NOTE — Patient Outreach (Signed)
[place] [facility]) Care Management  02/02/2020  [name] [name] June 29, 1932 [id number]   Referral Date: 01/31/20 Referral Source: MD referral  Referral Reason: Help in the home   Outreach Attempt: spoke with patient.  Discussed reason for referral. She states she really needs more help in the home and was not sure if [employer] paid for in home care. Advised her that medicare does not pay for custodial care such as bathing dressing and housekeeping.  However, did discuss medicaid.  Patient states she is paying caregiver now and sending about $800 per month but her money is dwindling.  She is open to seeing if she qualifies for medicaid or if she needs to spend down to qualify.     Social: Patient lives in the home alone but has support of son.  However, patient states she cannot care for herself. She is currently paying for care in the home about 3 hours per day.   Conditions: patient reports really bad arthritis that causes her not to be able to get up out of bed alone and depends on paid caregiver to get up in the mornings.  Patient also has history of anemia and hypertension.  She reports that they are managed at this time.     Medications: Patient takes medications as prescribed and offers no concerns.     Appointments:  Patient sees physician regularly and no problems with transportation.   Advanced Directives: Patient has advanced directives documented in chart.   Consent: Discussed [facility] services. Patient agreeable to social work for help with possible medicaid for care in the home.     Plan: RN CM will sign off and refer to social work at this time.   Jone Baseman, RN, MSN [facility] Care Management Care Management Coordinator Direct Line [phone] Toll Free: [phone]  Fax: [phone]

## 2020-02-02 NOTE — Patient Outreach (Signed)
[place] [facility]) Care Management  02/02/2020  [name] [name] 07-Oct-1932 [id number]   Referral Date: 01/31/20 Referral Source: MD referral Referral Reason: Help in the home   Outreach Attempt: No answer.  HIPAA compliant voice message left.    Plan: RN CM will attempt again within 4 business days and send letter.   Jone Baseman, RN, MSN [facility] Care Management Care Management Coordinator Direct Line [phone] Toll Free: [phone]  Fax: [phone]

## 2020-02-08 ENCOUNTER — Other Ambulatory Visit: Payer: Self-pay | Admitting: *Deleted

## 2020-02-08 NOTE — Patient Outreach (Signed)
[facility]) Care Management  02/08/2020  [name] [name] 17-Aug-1932 [id number]   CSW received referral on 02/02/2020 for possible Medicaid eligibility.  CSW made contact with pt and confirmed her identity.  CSW introduced self, role and reason for call.  Pt shared with CSW her monthly income and unfortunately is not eligible for Medicaid.  CSW reminded pt she should be receiving a "raise" next year on her Social Security check.   Pt reports paying for caregivers in the home and considering cutting back because of the expense.  Per pt she owns her home and has not outstanding mortgage.  Pt states she has considered selling the home to utilize the equity to cover costs of care/needs.   Pt lives alone and has 3 sons including one in [place]. Her niece assists with groceries.   CSW advised pt of her [employer] benefits including medical transport (6 round trip annually) as well as the OTC.  CSW encouraged pt to review the 2022 manual for additional and new year services.  CSW offered to mail pt resources that may also be of help with financial strains  including food pantry/sites, senior center offerings and transportation.   CSW will plan a follow up call to pt in 2-3 weeks.    Eduard Clos, MSW, [place] Worker  [place] [phone]

## 2020-02-12 ENCOUNTER — Other Ambulatory Visit: Payer: Self-pay

## 2020-02-12 DIAGNOSIS — R600 Localized edema: Secondary | ICD-10-CM

## 2020-02-12 MED ORDER — FUROSEMIDE 20 MG PO TABS: 20.0000 mg | ORAL_TABLET | Freq: Every day | ORAL | 0 refills | Status: DC

## 2020-02-19 ENCOUNTER — Other Ambulatory Visit: Payer: Self-pay

## 2020-02-19 DIAGNOSIS — R600 Localized edema: Secondary | ICD-10-CM

## 2020-02-19 MED ORDER — FUROSEMIDE 20 MG PO TABS: 20.0000 mg | ORAL_TABLET | Freq: Every day | ORAL | 1 refills | Status: DC

## 2020-02-20 ENCOUNTER — Telehealth: Payer: Self-pay

## 2020-02-20 NOTE — Telephone Encounter (Signed)
[  3:50 PM] [name], [name] [name] [id number] [employer] denying payment on AWV patient had CPE in [place]*    Called PT to advise if she could bring the paper that she received by the office , she said she would send someone by with it, so I could make a copy and send the original back home.

## 2020-02-26 ENCOUNTER — Other Ambulatory Visit: Payer: Self-pay | Admitting: *Deleted

## 2020-02-26 NOTE — Patient Outreach (Signed)
[place] [facility]) Care Management  02/26/2020  [name] [name] [birth year] [id number]   CSW made contact with pt who reports she did receive the resources mailed. "I really need therapy in home care".  Pt shared that she feels she needs PT and OT to help improve her strength/ability, etc.  CSW offered to advise PCP office of this and advised her she may have to be seen in office for the home health orders to be placed.   CSW will make contact with PC office and await response and then follow up with pt.   Eduard Clos, MSW, [place] Worker  [place] [phone]

## 2020-02-29 ENCOUNTER — Ambulatory Visit: Payer: Self-pay | Admitting: *Deleted

## 2020-03-05 ENCOUNTER — Other Ambulatory Visit: Payer: Self-pay | Admitting: *Deleted

## 2020-03-05 ENCOUNTER — Telehealth: Payer: Self-pay

## 2020-03-05 NOTE — Patient Outreach (Signed)
[facility]) Care Management  03/05/2020  KYLENA MOLE 04-29-1932 [id number]  CSW spoke with pt who reports she is still feeling weak and unable to do much.  CSW called PCP office and left message requesting home health PT and OT be arranged.   Pt with no other concerns at this time. CSW will plan to touch base with [facility] RNCM as well.   Eduard Clos, MSW, [place] Worker  [place] [phone]

## 2020-03-05 NOTE — Telephone Encounter (Signed)
[name] [name] with [facility] called. Patient is needing referral for PT/OT. Can I place this order. If so, what would you like it associated with?s

## 2020-03-07 ENCOUNTER — Ambulatory Visit: Payer: Self-pay | Admitting: *Deleted

## 2020-03-08 ENCOUNTER — Emergency Department (HOSPITAL_COMMUNITY)
Admission: EM | Admit: 2020-03-08 | Discharge: 2020-03-08 | Disposition: A | Discharge status: Home / Self Care | Payer: Medicare HMO | Attending: Emergency Medicine | Admitting: Emergency Medicine

## 2020-03-08 ENCOUNTER — Other Ambulatory Visit: Payer: Self-pay

## 2020-03-08 ENCOUNTER — Encounter (HOSPITAL_COMMUNITY): Payer: Self-pay | Admitting: *Deleted

## 2020-03-08 ENCOUNTER — Emergency Department (HOSPITAL_COMMUNITY): Payer: Medicare HMO

## 2020-03-08 DIAGNOSIS — M79605 Pain in left leg: Secondary | ICD-10-CM | POA: Insufficient documentation

## 2020-03-08 DIAGNOSIS — Z8542 Personal history of malignant neoplasm of other parts of uterus: Secondary | ICD-10-CM | POA: Insufficient documentation

## 2020-03-08 DIAGNOSIS — Z79899 Other long term (current) drug therapy: Secondary | ICD-10-CM | POA: Insufficient documentation

## 2020-03-08 DIAGNOSIS — Z7901 Long term (current) use of anticoagulants: Secondary | ICD-10-CM | POA: Diagnosis not present

## 2020-03-08 DIAGNOSIS — I10 Essential (primary) hypertension: Secondary | ICD-10-CM | POA: Insufficient documentation

## 2020-03-08 DIAGNOSIS — I82532 Chronic embolism and thrombosis of left popliteal vein: Secondary | ICD-10-CM | POA: Diagnosis not present

## 2020-03-08 DIAGNOSIS — I82432 Acute embolism and thrombosis of left popliteal vein: Secondary | ICD-10-CM | POA: Diagnosis not present

## 2020-03-08 DIAGNOSIS — M25561 Pain in right knee: Secondary | ICD-10-CM | POA: Insufficient documentation

## 2020-03-08 MED ORDER — METHOCARBAMOL 500 MG PO TABS
500.0000 mg | ORAL_TABLET | Freq: Every evening | ORAL | 0 refills | Status: DC | PRN
Start: 2020-03-08 — End: 2020-06-17

## 2020-03-08 NOTE — ED Triage Notes (Signed)
Pain in left upper thigh, history of blood clot

## 2020-03-08 NOTE — ED Provider Notes (Signed)
[facility] EMERGENCY DEPARTMENT Provider Note   CSN: [id number] Arrival date & time: 03/08/20  1053     History Chief Complaint  Patient presents with  . Leg Pain    [name] [name] is a 86 y.o. female.  HPI      [name] [name] is a 86 y.o. female with past medical history of DVT left lower extremity, currently anticoagulated with Eliquis, who presents to the Emergency Department complaining of intermittent, sharp stabbing type pains of her left lateral thigh.  The symptoms have been present for several days.  Her pains have not been associated with injury and occur with movement and at rest.  No swelling or redness of her leg. No numbness of her extremities.  She endorses right knee pain which she states is secondary to osteoarthritis, denies significant pain of her left knee.  She denies pain of her left lower leg, hip, and groin.   Past Medical History:  Diagnosis Date  . Arthritis    spinal stenosis  . Elevated LFTs    secondary to fatty liver, negative work-up in 2011  . Hypercholesterolemia   . Hypertension   . Lumbar spinal stenosis 01/14/2018   L3-4 and L4-5  . Myopathy 02/15/2018  . Osteoarthritis    right knee  . Peripheral neuropathy 07/06/2018  . PONV (postoperative nausea and vomiting)   . Uterine cancer ([place]) 08/2006   grade 1, no recurrence up to 2013    Patient Active Problem List   Diagnosis Date Noted  . Hemarthrosis involving knee joint, left 08/11/2019  . Hypomagnesemia 08/11/2019  . Weakness generalized 08/09/2019  . Lower extremity edema 08/06/2019  . Bilateral lower extremity edema 08/04/2019  . Deep vein thrombosis (DVT) of left lower extremity ([place]) 08/04/2019  . Hypokalemia 08/04/2019  . Hypoalbuminemia 08/04/2019  . Right thigh pain 05/15/2019  . Venous stasis ulcers ([place]) 04/03/2019  . Lower leg edema 03/22/2019  . At high risk for falls per [facility] fall risk assessment scale 02/09/2019  . Acute deep vein thrombosis (DVT) of left femoral  vein ([place]) 02/07/2019  . Neck pain, bilateral posterior 01/13/2019  . Statins contraindicated 08/14/2018  . Peripheral neuropathy 07/06/2018  . Dysphagia 05/03/2018  . 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 Lumbar spinal stenosis 01/14/2018  . Abnormal weight loss 07/29/2017  . GERD (gastroesophageal reflux disease) 06/17/2016  . Normocytic anemia   . 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 Chronic constipation 10/04/2012  . Urinary incontinence 06/20/2012  . Vitamin D deficiency 08/22/2011  . Prediabetes 05/29/2011  . NASH (nonalcoholic steatohepatitis) 11/05/2009  . Arthritis of right knee 09/04/2009  . Hyperlipidemia LDL goal <100 09/08/2007  . Essential hypertension 09/08/2007    Past Surgical History:  Procedure Laterality Date  . 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 APPENDECTOMY  1973  . BREAST BIOPSY Left 2018   Benign  . CHOLECYSTECTOMY  1973  . COLONOSCOPY    06/21/2007   QIW:[id number] rectum/Sigmoid diverticula, diminutive hepatic flexure polyp s/p bx. Benign.   . COLONOSCOPY N/A 10/17/2012   Dr. Oneida Alar- moderate diverticulosis was noted in the sigmoid colon, moderate sized internal hemorrhoids. next tcs in10 years  . ESOPHAGOGASTRODUODENOSCOPY N/A 03/20/2016   Dr. Oneida Alar, widely patent Schatzki ring, anemia due to ASA induced erosive gastritis, mild duodenitis. gastric bx benign without H.pylori.   Marland Kitchen FOOT SURGERY  2007   Pins in toes on left foot, 5 to 6 yrs ago  .  GIVENS CAPSULE STUDY N/A 03/20/2016   Procedure: GIVENS CAPSULE STUDY;  Surgeon: Danie Binder, MD;  [facility];  Service: Endoscopy;  Laterality: N/A;  . MUSCLE BIOPSY Left 06/07/2018   Procedure: LEFT THIGH MUSCLE BIOPSY;  Surgeon: Erroll Luna, MD;  Location: [place];  Service: General;  Laterality: Left;  . TONSILLECTOMY       OB History   No obstetric history on file.     Family History  Problem  Relation Age of Onset  . Heart disease Father   . Bladder Cancer Brother        in remission   . Hypertension Sister   . Dementia Sister   . Diabetes type II Sister   . Hypertension Sister   . Aneurysm Sister   . Hypertension Brother   . Arthritis Brother   . Hypertension Son   . Colon cancer Neg Hx     Social History   Tobacco Use  . Smoking status: Never Smoker  . Smokeless tobacco: Never Used  . Tobacco comment: Never smoked  Vaping Use  . Vaping Use: Never used  Substance Use Topics  . Alcohol use: No  . Drug use: No    Home Medications Prior to Admission medications   Medication Sig Start Date End Date Taking? Authorizing Provider  acetaminophen (TYLENOL) 650 MG CR tablet Take 650 mg by mouth every 6 (six) hours.  08/08/19   [provider]  ELIQUIS 5 MG TABS tablet TAKE 1 TABLET TWICE DAILY 12/04/19   Derek Jack, MD  ezetimibe (ZETIA) 10 MG tablet Take 1 tablet (10 mg total) by mouth daily. 08/28/19   Gerlene Fee, NP  folic acid (FOLVITE) 1 MG tablet Take 1 tablet (1 mg total) by mouth daily. 01/30/20   Derek Jack, MD  furosemide (LASIX) 20 MG tablet Take 1 tablet (20 mg total) by mouth daily. 02/19/20   Fayrene Helper, MD  magnesium oxide (MAG-OX) 400 (241.3 Mg) MG tablet Take 1 tablet (400 mg total) by mouth 2 (two) times daily. 12/07/19   Derek Jack, MD  NON FORMULARY Fluid restriction of 1.8 liters per day (1734m total) 7-3 = 800 ml 3-11 = 800 ml 11-7 = 175 ml Document totals qshift. Every Shift Day, Evening, Nigh 08/07/19   [provider]  NON FORMULARY Diet: _____ Regular, __x____ NAS, _______Consistent Carbohydrate, _______NPO _____Other    [provider]  pantoprazole (PROTONIX) 40 MG tablet Take 1 tablet (40 mg total) by mouth daily. 08/28/19   GGerlene Fee NP  triamterene-hydrochlorothiazide (MAXZIDE-25) 37.5-25 MG tablet Take 1 tablet by mouth daily. 11/01/19   SFayrene Helper MD     Allergies    Cellcept [mycophenolate mofetil], Statins, Ciprofloxacin hcl, Hydrocodone, and Penicillins  Review of Systems   Review of Systems  Constitutional: Negative for chills and fever.  Respiratory: Negative for chest tightness and shortness of breath.   Cardiovascular: Negative for chest pain and palpitations.  Gastrointestinal: Negative for abdominal pain, diarrhea, nausea and vomiting.  Genitourinary: Negative for difficulty urinating, dysuria and flank pain.  Musculoskeletal: Positive for myalgias (Left lateral thigh pain). Negative for arthralgias and joint swelling.  Skin: Negative for color change and wound.  Neurological: Negative for weakness and numbness.  Psychiatric/Behavioral: Negative for confusion.    Physical Exam Updated Vital Signs BP (!) 140/54 (BP Location: Left Arm)   Pulse 73   Temp 98 F (36.7 C) (Oral)   Resp 18   Ht  5' 6"  (1.676 m)   Wt 68.9 kg   SpO2 100%   BMI 24.53 kg/m   Physical Exam Vitals and nursing note reviewed.  Constitutional:      General: She is not in acute distress.    Appearance: Normal appearance. She is not ill-appearing.  Cardiovascular:     Rate and Rhythm: Normal rate and regular rhythm.     Pulses: Normal pulses.  Pulmonary:     Effort: Pulmonary effort is normal.     Breath sounds: Normal breath sounds.  Chest:     Chest wall: No tenderness.  Abdominal:     General: There is no distension.     Palpations: Abdomen is soft.     Tenderness: There is no abdominal tenderness.  Musculoskeletal:        General: Tenderness present. No swelling or signs of injury.     Cervical back: Normal range of motion.     Comments: Focal tenderness to palpation of the lateral mid left thigh.  No erythema, rash, or excessive warmth.  Patient able to range the left knee without pain.  No edema of the joint.  Left lower leg is nontender.  Patient has 1+ edema of the bilateral lower extremities.  Skin:    General: Skin is warm.      Capillary Refill: Capillary refill takes less than 2 seconds.     Findings: No erythema or rash.  Neurological:     General: No focal deficit present.     Mental Status: She is alert.     ED Results / Procedures / Treatments   Labs (all labs ordered are listed, but only abnormal results are displayed) Labs Reviewed - No data to display  EKG None  Radiology US Venous Img Lower Unilateral Left  Result Date: 03/08/2020 CLINICAL DATA:  86 year old female with left lower extremity pain, history of DVT. EXAM: LEFT LOWER EXTREMITY VENOUS DOPPLER ULTRASOUND TECHNIQUE: Gray-scale sonography with graded compression, as well as color Doppler and duplex ultrasound were performed to evaluate the left lower extremity deep venous systems from the level of the common femoral vein and including the common femoral, femoral, profunda femoral, popliteal and calf veins including the posterior tibial, peroneal and gastrocnemius veins when visible. Spectral Doppler was utilized to evaluate flow at rest and with distal augmentation maneuvers in the common femoral, femoral and popliteal veins. The contralateral common femoral vein was also evaluated for comparison. COMPARISON:  11/13/2019 FINDINGS: LEFT LOWER EXTREMITY Common Femoral Vein: No evidence of thrombus. Normal compressibility, respiratory phasicity and response to augmentation. Central Greater Saphenous Vein: No evidence of thrombus. Normal compressibility and flow on color Doppler imaging. Central Profunda Femoral Vein: No evidence of thrombus. Normal compressibility and flow on color Doppler imaging. Femoral Vein: No evidence of thrombus. Normal compressibility, respiratory phasicity and response to augmentation. Popliteal Vein: Resolution of previously visualized nonocclusive thrombus. No evidence of new thrombus. Normal compressibility, respiratory phasicity and response to augmentation. Calf Veins: Limited visualization. No evidence of thrombus. Normal  compressibility and flow on color Doppler imaging. Other Findings: Similar appearing distal lower extremity subcutaneous edema. RIGHT LOWER EXTREMITY Common Femoral Vein: No evidence of thrombus. Normal compressibility, respiratory phasicity and response to augmentation. IMPRESSION: No evidence of left lower extremity deep venous thrombosis. Resolution of previously visualized popliteal vein nonocclusive thrombus. Similar appearing subcutaneous edema in the distal left lower extremity. Ruthann Cancer, MD [facility] Electronically Signed   By: Ruthann Cancer MD  On: 03/08/2020 13:52    Procedures Procedures (including critical care time)  Medications Ordered in ED Medications - No data to display  ED Course  I have reviewed the triage vital signs and the nursing notes.  Pertinent labs & imaging results that were available during my care of the patient were reviewed by me and considered in my medical decision making (see chart for details).    MDM Rules/Calculators/A&P                          Patient here with intermittent sharp pains for several days to the mid lateral left thigh.  No skin changes or history of injury.  Neurovascularly intact.  No tenderness of the left knee joint.  History of DVT of the left lower extremity, currently anticoagulated on Eliquis.  Will obtain ultrasound for further evaluation.  On recheck, patient resting comfortably.  Vital signs reassuring.  Ultrasound negative for evidence of DVT.  Current symptoms felt to be secondary to muscular spasms.  Patient appropriate for discharge home, caregiver at bedside.  Agrees to close outpatient follow-up with PCP.  Return precautions given.  Final Clinical Impression(s) / ED Diagnoses Final diagnoses:  Left leg pain    Rx / DC Orders ED Discharge Orders    None       Kem Parkinson, PA-C 03/08/20 1430    Fredia Sorrow, MD 03/30/20 [phone]

## 2020-03-08 NOTE — Discharge Instructions (Addendum)
The ultrasound of your left leg today did not show evidence of a blood clot.  Your symptoms are likely related to a muscle spasm.  You may try alternating ice and heat to your leg when needed.  I have prescribed a muscle relaxer that you may take at bedtime if needed.  This medication may cause drowsiness, if so discontinue use.  Follow-up with your primary care provider next week for recheck.

## 2020-03-11 ENCOUNTER — Other Ambulatory Visit: Payer: Self-pay | Admitting: *Deleted

## 2020-03-13 ENCOUNTER — Other Ambulatory Visit: Payer: Self-pay | Admitting: Neurology

## 2020-03-13 ENCOUNTER — Ambulatory Visit: Payer: Medicare HMO | Admitting: Neurology

## 2020-03-13 ENCOUNTER — Encounter: Payer: Self-pay | Admitting: Neurology

## 2020-03-13 ENCOUNTER — Ambulatory Visit
Admission: RE | Admit: 2020-03-13 | Discharge: 2020-03-13 | Disposition: A | Discharge status: Home / Self Care | Payer: Medicare HMO | Source: Ambulatory Visit | Attending: Neurology | Admitting: Neurology

## 2020-03-13 ENCOUNTER — Other Ambulatory Visit: Payer: Self-pay

## 2020-03-13 VITALS — BP 153/71 | HR 70 | Ht 65.0 in | Wt 152.0 lb

## 2020-03-13 DIAGNOSIS — M48061 Spinal stenosis, lumbar region without neurogenic claudication: Secondary | ICD-10-CM | POA: Diagnosis not present

## 2020-03-13 DIAGNOSIS — M19011 Primary osteoarthritis, right shoulder: Secondary | ICD-10-CM | POA: Diagnosis not present

## 2020-03-13 DIAGNOSIS — Z5181 Encounter for therapeutic drug level monitoring: Secondary | ICD-10-CM

## 2020-03-13 DIAGNOSIS — G7249 Other inflammatory and immune myopathies, not elsewhere classified: Secondary | ICD-10-CM | POA: Diagnosis not present

## 2020-03-13 DIAGNOSIS — M25511 Pain in right shoulder: Secondary | ICD-10-CM | POA: Diagnosis not present

## 2020-03-13 DIAGNOSIS — M25512 Pain in left shoulder: Secondary | ICD-10-CM | POA: Diagnosis not present

## 2020-03-13 DIAGNOSIS — G8929 Other chronic pain: Secondary | ICD-10-CM | POA: Diagnosis not present

## 2020-03-13 DIAGNOSIS — M19012 Primary osteoarthritis, left shoulder: Secondary | ICD-10-CM | POA: Diagnosis not present

## 2020-03-13 DIAGNOSIS — I700 Atherosclerosis of aorta: Secondary | ICD-10-CM | POA: Diagnosis not present

## 2020-03-13 NOTE — Telephone Encounter (Signed)
[name] [name] w/ [facility] called again. Patient needs HH, PT/OT. Can this be ordered and if so what do you want it associated with?

## 2020-03-13 NOTE — Progress Notes (Signed)
Reason for visit: Inflammatory myopathy, peripheral neuropathy, gait disorder  Referring physician: Dr. Fonnie Mu is a 86 y.o. female  History of present illness:  [name] is an 86 year old right-handed black female with a history of lumbosacral spinal stenosis and she was found to have a peripheral neuropathy with an inflammatory myopathy in 2019.  The patient was placed on prednisone and methotrexate, she had normalization of her muscle enzyme levels on this regimen.  The patient was last seen in October 2020, she has been lost to follow-up since that time, and she apparently has reduced her prednisone dosing to 5 mg every other day and she is now off of methotrexate.  The patient has had severe arthritis affecting her right knee, she has not been able to ambulate at all over the last 4 months.  She was recently seen in the emergency room on 08 March 2020 with left leg discomfort.  She now is on Eliquis for a DVT, but the recent ER evaluation did not show a thrombosis.  The patient has had difficulty elevating the arms on both sides, she has some discomfort particularly with the left arm in the shoulder area.  The patient does report some urinary urgency and occasional incontinence.  She has had no recent falls, she has fallen several months ago.  The patient is sent to this office for further evaluation.  Past Medical History:  Diagnosis Date  . Arthritis    spinal stenosis  . Elevated LFTs    secondary to fatty liver, negative work-up in 2011  . Hypercholesterolemia   . Hypertension   . Lumbar spinal stenosis 01/14/2018   L3-4 and L4-5  . Myopathy 02/15/2018  . Osteoarthritis    right knee  . Peripheral neuropathy 07/06/2018  . PONV (postoperative nausea and vomiting)   . Uterine cancer ([place]) 08/2006   grade 1, no recurrence up to 2013    Past Surgical History:  Procedure Laterality Date  . 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  APPENDECTOMY  1973  . BREAST BIOPSY Left 2018   Benign  . CHOLECYSTECTOMY  1973  . COLONOSCOPY    06/21/2007   QGB:EEFEOF rectum/Sigmoid diverticula, diminutive hepatic flexure polyp s/p bx. Benign.   . COLONOSCOPY N/A 10/17/2012   Dr. Oneida Alar- moderate diverticulosis was noted in the sigmoid colon, moderate sized internal hemorrhoids. next tcs in10 years  . ESOPHAGOGASTRODUODENOSCOPY N/A 03/20/2016   Dr. Oneida Alar, widely patent Schatzki ring, anemia due to ASA induced erosive gastritis, mild duodenitis. gastric bx benign without H.pylori.   Marland Kitchen FOOT SURGERY  2007   Pins in toes on left foot, 5 to 6 yrs ago  . GIVENS CAPSULE STUDY N/A 03/20/2016   Procedure: GIVENS CAPSULE STUDY;  Surgeon: Danie Binder, MD;  [facility];  Service: Endoscopy;  Laterality: N/A;  . MUSCLE BIOPSY Left 06/07/2018   Procedure: LEFT THIGH MUSCLE BIOPSY;  Surgeon: Erroll Luna, MD;  Location: [facility];  Service: General;  Laterality: Left;  . TONSILLECTOMY      Family History  Problem Relation Age of Onset  . Heart disease Father   . Bladder Cancer Brother        in remission   . Hypertension Sister   . Dementia Sister   . Diabetes type II Sister   . Hypertension Sister   . Aneurysm Sister   . Hypertension Brother   . Arthritis Brother   . Hypertension  Son   . Colon cancer Neg Hx     Social history:  reports that she has never smoked. She has never used smokeless tobacco. She reports that she does not drink alcohol and does not use drugs.  Medications:  Prior to Admission medications   Medication Sig Start Date End Date Taking? Authorizing Provider  acetaminophen (TYLENOL) 650 MG CR tablet Take 650 mg by mouth every 6 (six) hours.  08/08/19  Yes [provider]  ELIQUIS 5 MG TABS tablet TAKE 1 TABLET TWICE DAILY 12/04/19  Yes Derek Jack, MD  ezetimibe (ZETIA) 10 MG tablet Take 1 tablet (10 mg total) by mouth daily. 08/28/19  Yes Gerlene Fee, NP  folic  acid (FOLVITE) 1 MG tablet Take 1 tablet (1 mg total) by mouth daily. 01/30/20  Yes Derek Jack, MD  furosemide (LASIX) 20 MG tablet Take 1 tablet (20 mg total) by mouth daily. 02/19/20  Yes Fayrene Helper, MD  magnesium oxide (MAG-OX) 400 (241.3 Mg) MG tablet Take 1 tablet (400 mg total) by mouth 2 (two) times daily. 12/07/19  Yes Derek Jack, MD  methocarbamol (ROBAXIN) 500 MG tablet Take 1 tablet (500 mg total) by mouth at bedtime as needed for muscle spasms. May cause drowsiness 03/08/20  Yes Triplett, Tammy, PA-C  NON FORMULARY Fluid restriction of 1.8 liters per day (1748m total) 7-3 = 800 ml 3-11 = 800 ml 11-7 = 175 ml Document totals qshift. Every Shift Day, Evening, Nigh 08/07/19  Yes [provider]  NON FORMULARY Diet: _____ Regular, __x____ NAS, _______Consistent Carbohydrate, _______NPO _____Other   Yes [provider]  pantoprazole (PROTONIX) 40 MG tablet Take 1 tablet (40 mg total) by mouth daily. 08/28/19  Yes GGerlene Fee NP  triamterene-hydrochlorothiazide (MAXZIDE-25) 37.5-25 MG tablet Take 1 tablet by mouth daily. 11/01/19  Yes SFayrene Helper MD      Allergies  Allergen Reactions  . Cellcept [Mycophenolate Mofetil]     Muscle cramps, mouth soreness, difficulty swallowing, bruising  . Statins     Dx of in  . Ciprofloxacin Hcl Other (See Comments)    Chills, sick, could not tolerate it  . Hydrocodone Nausea Only  . Penicillins Rash    ROS:  Out of a complete 14 system review of symptoms, the patient complains only of the following symptoms, and all other reviewed systems are negative.  Joint pain Muscle weakness Walking difficulty Urinary incontinence  Blood pressure (!) 153/71, pulse 70, height 5' 5"  (1.651 m), weight 152 lb (68.9 kg).  Physical Exam  General: The patient is alert and cooperative at the time of the examination.  Eyes: Pupils are equal, round, and reactive to light. Discs are flat  bilaterally.  Neck: The neck is supple, no carotid bruits are noted.  Respiratory: The respiratory examination is clear.  Cardiovascular: The cardiovascular examination reveals a regular rate and rhythm, a grade II/VI systolic murmur maximal at the aortic area was noted.  Skin: Extremities are with 3+ edema below the knees bilaterally.  Neurologic Exam  Mental status: The patient is alert and oriented x 3 at the time of the examination. The patient has apparent normal recent and remote memory, with an apparently normal attention span and concentration ability.  Cranial nerves: Facial symmetry is present. There is good sensation of the face to pinprick and soft touch bilaterally. The strength of the facial muscles and the muscles to head turning and shoulder shrug are normal bilaterally. Speech is well enunciated, no aphasia  or dysarthria is noted. Extraocular movements are full. Visual fields are full. The tongue is midline, and the patient has symmetric elevation of the soft palate. No obvious hearing deficits are noted.  Motor: The motor testing reveals 5 over 5 strength of the upper extremities.  With exception that the patient is not able to elevate either arm past 30 degrees.  With passive elevation of the arm and rotational movement, there is some discomfort in the shoulder bilaterally, left greater than right.  With the lower extremities, there is 3/5 strength with hip flexion bilaterally with good strength with knee extension and flexion, good abduction strength bilaterally and slight weakness with adduction bilaterally.  The patient has good dorsiflexion strength with the feet bilaterally.  Good symmetric motor tone is noted throughout.  Sensory: Sensory testing is intact to pinprick, soft touch, vibration sensation, and position sense on all 4 extremities, with exception that vibration sensation is impaired in both feet. No evidence of extinction is noted.  Coordination: Cerebellar  testing reveals some difficulty with performing finger-nose-finger due to shoulder weakness bilaterally, she is unable to perform heel-to-shin on either side.  Gait and station: The patient is wheelchair-bound, she is unable to ambulate.  Reflexes: Deep tendon reflexes are symmetric, if anything somewhat brisk in the arms, depressed in the legs. Toes are downgoing bilaterally.   Assessment/Plan:  1.  History of inflammatory myopathy, off medical therapy  2.  Peripheral neuropathy  3.  Lumbosacral spinal stenosis  4.  Severe degenerative arthritis, right knee  5.  Bilateral shoulder discomfort  The patient be sent for x-rays of the shoulders, the inability to elevate her arms may be related to intrinsic shoulder disease on both sides.  The patient will be sent for blood work today, if the muscle enzyme level is significantly elevated we will reinitiate medical therapy for her myositis.  The gait problem appears to be multifactorial in nature related to the peripheral neuropathy, myopathy, degenerative arthritis, and the lumbar spinal stenosis.  The patient will follow up here in 3 months.  The strength of the arms appears to be improved from when she was seen here last, previously she had weakness with extension of the knees, this is no longer the case.  She still has weakness with hip flexion.  I will get home health physical therapy set up.  Jill Alexanders MD 03/13/2020 11:24 AM  [facility] [address] [place], [place] [address]  Phone [phone] Fax [phone]

## 2020-03-13 NOTE — Patient Outreach (Signed)
[facility]) Care Management  03/13/2020  [name] [name] Jul 13, 1932 [id number]   CSW made contact with pt who reports no [facility] outreach calls received yet.  CSW left message for PCP office today to request [place] orders be placed for pt.   CSW will also route letter to PCP office.  CSW will plan an outreach call to pt again in the next 2 weeks for updates on the status of HH.    Eduard Clos, MSW, [place] Worker  [place] [phone]

## 2020-03-13 NOTE — Telephone Encounter (Signed)
Yes please associate with peripheral neuropathy, osteoarthritis spine, lower extremity weakness

## 2020-03-14 ENCOUNTER — Other Ambulatory Visit: Payer: Self-pay

## 2020-03-14 ENCOUNTER — Telehealth: Payer: Self-pay | Admitting: Neurology

## 2020-03-14 DIAGNOSIS — G609 Hereditary and idiopathic neuropathy, unspecified: Secondary | ICD-10-CM

## 2020-03-14 DIAGNOSIS — M479 Spondylosis, unspecified: Secondary | ICD-10-CM

## 2020-03-14 DIAGNOSIS — R29898 Other symptoms and signs involving the musculoskeletal system: Secondary | ICD-10-CM

## 2020-03-14 LAB — CBC WITH DIFFERENTIAL/PLATELET
Basophils Absolute: 0 10*3/uL (ref 0.0–0.2)
Basos: 0 %
EOS (ABSOLUTE): 0.1 10*3/uL (ref 0.0–0.4)
Eos: 2 %
Hematocrit: 36.4 % (ref 34.0–46.6)
Hemoglobin: 11.9 g/dL (ref 11.1–15.9)
Immature Grans (Abs): 0 10*3/uL (ref 0.0–0.1)
Immature Granulocytes: 0 %
Lymphocytes Absolute: 1.1 10*3/uL (ref 0.7–3.1)
Lymphs: 22 %
MCH: 30 pg (ref 26.6–33.0)
MCHC: 32.7 g/dL (ref 31.5–35.7)
MCV: 92 fL (ref 79–97)
Monocytes Absolute: 0.6 10*3/uL (ref 0.1–0.9)
Monocytes: 11 %
Neutrophils Absolute: 3.2 10*3/uL (ref 1.4–7.0)
Neutrophils: 65 %
Platelets: 214 10*3/uL (ref 150–450)
RBC: 3.97 x10E6/uL (ref 3.77–5.28)
RDW: 12 % (ref 11.7–15.4)
WBC: 5 10*3/uL (ref 3.4–10.8)

## 2020-03-14 LAB — COMPREHENSIVE METABOLIC PANEL
ALT: 44 IU/L — ABNORMAL HIGH (ref 0–32)
AST: 66 IU/L — ABNORMAL HIGH (ref 0–40)
Albumin/Globulin Ratio: 1.7 (ref 1.2–2.2)
Albumin: 3.9 g/dL (ref 3.6–4.6)
Alkaline Phosphatase: 54 IU/L (ref 44–121)
BUN/Creatinine Ratio: 41 — ABNORMAL HIGH (ref 12–28)
BUN: 29 mg/dL — ABNORMAL HIGH (ref 8–27)
Bilirubin Total: 0.4 mg/dL (ref 0.0–1.2)
CO2: 25 mmol/L (ref 20–29)
Calcium: 9.8 mg/dL (ref 8.7–10.3)
Chloride: 103 mmol/L (ref 96–106)
Creatinine, Ser: 0.7 mg/dL (ref 0.57–1.00)
GFR calc Af Amer: 91 mL/min/{1.73_m2} (ref >59)
GFR calc non Af Amer: 79 mL/min/{1.73_m2} (ref >59)
Globulin, Total: 2.3 g/dL (ref 1.5–4.5)
Glucose: 103 mg/dL — ABNORMAL HIGH (ref 65–99)
Potassium: 4.5 mmol/L (ref 3.5–5.2)
Sodium: 143 mmol/L (ref 134–144)
Total Protein: 6.2 g/dL (ref 6.0–8.5)

## 2020-03-14 LAB — CK: Total CK: 1187 U/L (ref 26–161)

## 2020-03-14 MED ORDER — METHOTREXATE 2.5 MG PO TABS: 10.0000 mg | ORAL_TABLET | ORAL | 5 refills | Status: DC

## 2020-03-14 MED ORDER — PREDNISONE 5 MG PO TABS: 10.0000 mg | ORAL_TABLET | Freq: Every day | ORAL | 1 refills | Status: DC

## 2020-03-14 MED ORDER — FOLIC ACID 1 MG PO TABS: 1.0000 mg | ORAL_TABLET | Freq: Every day | ORAL | 3 refills | Status: DC

## 2020-03-14 NOTE — Telephone Encounter (Signed)
HH order placed

## 2020-03-14 NOTE — Telephone Encounter (Signed)
Pt. called back to speak with Dr. She asks to be called back on [phone].

## 2020-03-14 NOTE — Addendum Note (Signed)
Addended by: Kathrynn Ducking on: 03/14/2020 11:19 AM   Modules accepted: Orders

## 2020-03-14 NOTE — Telephone Encounter (Signed)
I called the patient.  The blood work shows elevation of the muscle enzymes to around 1200 again, she will need to go back on methotrexate.  She did not tolerate CellCept previously.  I will call in a prescription for prednisone to get her back on 10 mg daily and 10 mg of methotrexate once a week, and get her on folic acid.  On this regimen, her muscle enzyme levels normalized previously.  The x-rays of the shoulders did show some AC joint arthritis bilaterally.

## 2020-03-27 ENCOUNTER — Other Ambulatory Visit: Payer: Self-pay | Admitting: *Deleted

## 2020-03-27 NOTE — Patient Outreach (Signed)
[facility]) Care Management  03/27/2020  VELINA DROLLINGER [birth year] [id number]   CSW spoke with pt by phone who reports she has not heard from any home health agency provider.  "I went to the Neurologist the other day and they asked me about it too".  CSW will attempt outreach to PCP regarding the request for [facility] services.   CSW will touch base with pt next week for updates.   Eduard Clos, MSW, [place] Worker  [place] [phone]

## 2020-03-29 ENCOUNTER — Other Ambulatory Visit: Payer: Self-pay | Admitting: Neurology

## 2020-03-29 DIAGNOSIS — G7249 Other inflammatory and immune myopathies, not elsewhere classified: Secondary | ICD-10-CM

## 2020-04-02 ENCOUNTER — Other Ambulatory Visit: Payer: Self-pay | Admitting: *Deleted

## 2020-04-03 NOTE — Patient Outreach (Signed)
[facility]) Care Management  04/03/2020  [name] [name] [birth year] [id number]    CSW made contact with pt and advised her of communication received from PCP office in regards to the [facility] orders being arranged. Pt has been referred to [facility] per report.  CSW advised pt to expect outreach call from them to initiate services.   CSW also will contact [facility] to confirm orders included SW for in home assessment and support.  CSW will sign off at this time and advise PCP and [facility] team of above.   Eduard Clos, MSW, [place] Worker  [place] [phone]

## 2020-04-05 NOTE — Patient Outreach (Signed)
[facility]) Care Management  04/05/2020  [name] [name] 27-May-1932 [id number]   CSW made call to [facility] to inquire about pt's Home Health orders. CSW was informed they do not have a referral for this pt.  CSW will outreach PCP office regarding this to attempt to resolve this barrier.  Per Dr Jannifer Franklin' note, he made a referral 02/24/2020 and per Jinny Sanders at Dr Griffin Dakin office they also have completed the [facility] order.  Eduard Clos, MSW, [place] Worker  [place] [phone]

## 2020-04-09 ENCOUNTER — Other Ambulatory Visit: Payer: Self-pay | Admitting: *Deleted

## 2020-04-10 NOTE — Patient Outreach (Signed)
[facility]) Care Management  04/10/2020  [name] [name] [birth year] [id number]   CSW spoke with Theadora Rama at PCP office who indicates notes indicate [facility] has the referral and she will call to make sure they can staff it and will outreach pt asap.   CSW will touch base with pt again next week for updates.   Eduard Clos, MSW, [place] Worker  [place] [phone]

## 2020-04-13 ENCOUNTER — Other Ambulatory Visit: Payer: Self-pay | Admitting: Family Medicine

## 2020-04-13 ENCOUNTER — Other Ambulatory Visit: Payer: Self-pay | Admitting: Neurology

## 2020-04-16 ENCOUNTER — Other Ambulatory Visit: Payer: Self-pay | Admitting: *Deleted

## 2020-04-17 NOTE — Patient Outreach (Signed)
[facility]) Care Management  04/17/2020  BRANDIS WIXTED Aug 05, 1932 [id number]  CSW received word from PCP office that [facility] will be providing pt with home health care.  CSW contacted pt today to advise of this and she reports they called today and will be coming tomorrow to do the evaluation.   CSW will plan to touch base next week for updates.    Eduard Clos, MSW, [place] Worker  [place] [phone]

## 2020-04-18 DIAGNOSIS — M1711 Unilateral primary osteoarthritis, right knee: Secondary | ICD-10-CM | POA: Diagnosis not present

## 2020-04-18 DIAGNOSIS — I82412 Acute embolism and thrombosis of left femoral vein: Secondary | ICD-10-CM | POA: Diagnosis not present

## 2020-04-18 DIAGNOSIS — G629 Polyneuropathy, unspecified: Secondary | ICD-10-CM | POA: Diagnosis not present

## 2020-04-18 DIAGNOSIS — K760 Fatty (change of) liver, not elsewhere classified: Secondary | ICD-10-CM | POA: Diagnosis not present

## 2020-04-18 DIAGNOSIS — G8929 Other chronic pain: Secondary | ICD-10-CM | POA: Diagnosis not present

## 2020-04-18 DIAGNOSIS — G7249 Other inflammatory and immune myopathies, not elsewhere classified: Secondary | ICD-10-CM | POA: Diagnosis not present

## 2020-04-18 DIAGNOSIS — M4807 Spinal stenosis, lumbosacral region: Secondary | ICD-10-CM | POA: Diagnosis not present

## 2020-04-18 DIAGNOSIS — M25511 Pain in right shoulder: Secondary | ICD-10-CM | POA: Diagnosis not present

## 2020-04-18 DIAGNOSIS — M25512 Pain in left shoulder: Secondary | ICD-10-CM | POA: Diagnosis not present

## 2020-04-19 ENCOUNTER — Telehealth: Payer: Self-pay | Admitting: Neurology

## 2020-04-19 NOTE — Telephone Encounter (Signed)
LVM: [facility]) called, Dr. Jannifer Franklin has referred [place] in on Pt. Just calling back in regard to that, just wanted to make sure the frequency of 1 week 1, 2 week 3, 1 week 1 was ok with Dr. Jannifer Franklin. Working with her on transfers, pain management. Just to get her strength little bit. Increase strength to help with transfers and mobility in the home, fall prevention.  Contact info: [phone]

## 2020-04-22 DIAGNOSIS — G8929 Other chronic pain: Secondary | ICD-10-CM | POA: Diagnosis not present

## 2020-04-22 DIAGNOSIS — G7249 Other inflammatory and immune myopathies, not elsewhere classified: Secondary | ICD-10-CM | POA: Diagnosis not present

## 2020-04-22 DIAGNOSIS — M4807 Spinal stenosis, lumbosacral region: Secondary | ICD-10-CM | POA: Diagnosis not present

## 2020-04-22 DIAGNOSIS — M1711 Unilateral primary osteoarthritis, right knee: Secondary | ICD-10-CM | POA: Diagnosis not present

## 2020-04-22 DIAGNOSIS — K760 Fatty (change of) liver, not elsewhere classified: Secondary | ICD-10-CM | POA: Diagnosis not present

## 2020-04-22 DIAGNOSIS — G629 Polyneuropathy, unspecified: Secondary | ICD-10-CM | POA: Diagnosis not present

## 2020-04-22 DIAGNOSIS — M25511 Pain in right shoulder: Secondary | ICD-10-CM | POA: Diagnosis not present

## 2020-04-22 DIAGNOSIS — M25512 Pain in left shoulder: Secondary | ICD-10-CM | POA: Diagnosis not present

## 2020-04-22 DIAGNOSIS — I82412 Acute embolism and thrombosis of left femoral vein: Secondary | ICD-10-CM | POA: Diagnosis not present

## 2020-04-22 NOTE — Telephone Encounter (Signed)
I called the physical therapist, Tressia Danas, okay with plan for physical therapy.

## 2020-04-23 ENCOUNTER — Other Ambulatory Visit: Payer: Self-pay | Admitting: *Deleted

## 2020-04-23 NOTE — Patient Outreach (Signed)
[facility]) Care Management  04/23/2020  [name] [name] [birth year] [id number]   CSW made contact with pt today who reports [name] came yesterday to begin her home health services. "I have neuropathy". Pt is pleased to have the therapies in place.  CSW advised pt of plans to close referral. Pt recalls conversation about her income being too high for Medicaid PCS and she will continue to pay out of pocket for private duty caregiver support as she can.   CSW will advise PCP and [facility] team of above plans.   Eduard Clos, MSW, [place] Worker  [place] [phone]

## 2020-04-24 DIAGNOSIS — M25512 Pain in left shoulder: Secondary | ICD-10-CM | POA: Diagnosis not present

## 2020-04-24 DIAGNOSIS — K760 Fatty (change of) liver, not elsewhere classified: Secondary | ICD-10-CM | POA: Diagnosis not present

## 2020-04-24 DIAGNOSIS — M1711 Unilateral primary osteoarthritis, right knee: Secondary | ICD-10-CM | POA: Diagnosis not present

## 2020-04-24 DIAGNOSIS — M25511 Pain in right shoulder: Secondary | ICD-10-CM | POA: Diagnosis not present

## 2020-04-24 DIAGNOSIS — G8929 Other chronic pain: Secondary | ICD-10-CM | POA: Diagnosis not present

## 2020-04-24 DIAGNOSIS — I82412 Acute embolism and thrombosis of left femoral vein: Secondary | ICD-10-CM | POA: Diagnosis not present

## 2020-04-24 DIAGNOSIS — G629 Polyneuropathy, unspecified: Secondary | ICD-10-CM | POA: Diagnosis not present

## 2020-04-24 DIAGNOSIS — M4807 Spinal stenosis, lumbosacral region: Secondary | ICD-10-CM | POA: Diagnosis not present

## 2020-04-24 DIAGNOSIS — G7249 Other inflammatory and immune myopathies, not elsewhere classified: Secondary | ICD-10-CM | POA: Diagnosis not present

## 2020-04-25 ENCOUNTER — Ambulatory Visit: Payer: Medicare HMO | Admitting: Urology

## 2020-04-30 ENCOUNTER — Encounter: Payer: Self-pay | Admitting: Family Medicine

## 2020-04-30 ENCOUNTER — Telehealth (INDEPENDENT_AMBULATORY_CARE_PROVIDER_SITE_OTHER): Payer: Medicare HMO | Admitting: Family Medicine

## 2020-04-30 ENCOUNTER — Other Ambulatory Visit: Payer: Self-pay

## 2020-04-30 DIAGNOSIS — M25512 Pain in left shoulder: Secondary | ICD-10-CM | POA: Diagnosis not present

## 2020-04-30 DIAGNOSIS — I82412 Acute embolism and thrombosis of left femoral vein: Secondary | ICD-10-CM | POA: Diagnosis not present

## 2020-04-30 DIAGNOSIS — B028 Zoster with other complications: Secondary | ICD-10-CM

## 2020-04-30 DIAGNOSIS — G629 Polyneuropathy, unspecified: Secondary | ICD-10-CM | POA: Diagnosis not present

## 2020-04-30 DIAGNOSIS — Z9181 History of falling: Secondary | ICD-10-CM

## 2020-04-30 DIAGNOSIS — M4807 Spinal stenosis, lumbosacral region: Secondary | ICD-10-CM | POA: Diagnosis not present

## 2020-04-30 DIAGNOSIS — G7249 Other inflammatory and immune myopathies, not elsewhere classified: Secondary | ICD-10-CM | POA: Diagnosis not present

## 2020-04-30 DIAGNOSIS — M1711 Unilateral primary osteoarthritis, right knee: Secondary | ICD-10-CM | POA: Diagnosis not present

## 2020-04-30 DIAGNOSIS — M25511 Pain in right shoulder: Secondary | ICD-10-CM | POA: Diagnosis not present

## 2020-04-30 DIAGNOSIS — B029 Zoster without complications: Secondary | ICD-10-CM | POA: Insufficient documentation

## 2020-04-30 DIAGNOSIS — G8929 Other chronic pain: Secondary | ICD-10-CM | POA: Diagnosis not present

## 2020-04-30 DIAGNOSIS — K760 Fatty (change of) liver, not elsewhere classified: Secondary | ICD-10-CM | POA: Diagnosis not present

## 2020-04-30 MED ORDER — HYDROXYZINE HCL 10 MG PO TABS: ORAL_TABLET | ORAL | 0 refills | Status: DC

## 2020-04-30 MED ORDER — ACYCLOVIR 800 MG PO TABS: 800.0000 mg | ORAL_TABLET | Freq: Every day | ORAL | 0 refills | Status: DC

## 2020-04-30 MED ORDER — DOXYCYCLINE HYCLATE 100 MG PO TABS: 100.0000 mg | ORAL_TABLET | Freq: Two times a day (BID) | ORAL | 0 refills | Status: DC

## 2020-04-30 NOTE — Assessment & Plan Note (Addendum)
Right ear, right posterior neck and right jaw, rx acyclovir, doxycycline , hydroxyzine, f/U in 1 week

## 2020-04-30 NOTE — Patient Instructions (Addendum)
F/U in 1 week, video, call if youn eed me sooner  Three medications are prescribed for Shingles with bacterial skin infection, these are acyclovir, doxycycline and hydroxyzine  Please keep hands away from  The rash as this increases the chance of spreading the infection and ESPECIALLY away from your  Eyes  Medication for bedtime use to reduce itching may make you drowsy so please be careful not to fall, if it does do not take it and I recommend you wearing gloves when asleep   Shingles  Shingles is an infection. It gives you a painful skin rash and blisters that have fluid in them. Shingles is caused by the same germ (virus) that causes chickenpox. Shingles only happens in people who:  Have had chickenpox.  Have been given a shot of medicine (vaccine) to protect against chickenpox. Shingles is rare in this group. The first symptoms of shingles may be itching, tingling, or pain in an area on your skin. A rash will show on your skin a few days or weeks later. The rash is likely to be on one side of your body. The rash usually has a shape like a belt or a band. Over time, the rash turns into fluid-filled blisters. The blisters will break open, change into scabs, and dry up. Medicines may:  Help with pain and itching.  Help you get better sooner.  Help to prevent long-term problems. Follow these instructions at home: Medicines  Take over-the-counter and prescription medicines only as told by your doctor.  Put on an anti-itch cream or numbing cream where you have a rash, blisters, or scabs. Do this as told by your doctor. Helping with itching and discomfort  Put cold, wet cloths (cold compresses) on the area of the rash or blisters as told by your doctor.  Cool baths can help you feel better. Try adding baking soda or dry oatmeal to the water to lessen itching. Do not bathe in hot water.   Blister and rash care  Keep your rash covered with a loose bandage (dressing).  Wear loose  clothing that does not rub on your rash.  Keep your rash and blisters clean. To do this, wash the area with mild soap and cool water as told by your doctor.  Check your rash every day for signs of infection. Check for: ? More redness, swelling, or pain. ? Fluid or blood. ? Warmth. ? Pus or a bad smell.  Do not scratch your rash. Do not pick at your blisters. To help you to not scratch: ? Keep your fingernails clean and cut short. ? Wear gloves or mittens when you sleep, if scratching is a problem. General instructions  Rest as told by your doctor.  Keep all follow-up visits as told by your doctor. This is important.  Wash your hands often with soap and water. If soap and water are not available, use hand sanitizer. Doing this lowers your chance of getting a skin infection caused by germs (bacteria).  Your infection can cause chickenpox in people who have never had chickenpox or never got a shot of chickenpox vaccine. If you have blisters that did not change into scabs yet, try not to touch other people or be around other people, especially: ? Babies. ? Pregnant women. ? Children who have areas of red, itchy, or rough skin (eczema). ? Very old people who have transplants. ? People who have a long-term (chronic) sickness, like cancer or AIDS. Contact a doctor if:  Your pain does  not get better with medicine.  Your pain does not get better after the rash heals.  You have any signs of infection in the rash area. These signs include: ? More redness, swelling, or pain around the rash. ? Fluid or blood coming from the rash. ? The rash area feeling warm to the touch. ? Pus or a bad smell coming from the rash. Get help right away if:  The rash is on your face or nose.  You have pain in your face or pain by your eye.  You lose feeling on one side of your face.  You have trouble seeing.  You have ear pain, or you have ringing in your ear.  You have a loss of taste.  Your  condition gets worse. Summary  Shingles gives you a painful skin rash and blisters that have fluid in them.  Shingles is an infection. It is caused by the same germ (virus) that causes chickenpox.  Keep your rash covered with a loose bandage (dressing). Wear loose clothing that does not rub on your rash.  If you have blisters that did not change into scabs yet, try not to touch other people or be around people. This information is not intended to replace advice given to you by your health care provider. Make sure you discuss any questions you have with your health care provider. Document Revised: 07/01/2018 Document Reviewed: 11/11/2016 Elsevier Patient Education  2021 [employer].

## 2020-05-03 DIAGNOSIS — M25512 Pain in left shoulder: Secondary | ICD-10-CM | POA: Diagnosis not present

## 2020-05-03 DIAGNOSIS — G629 Polyneuropathy, unspecified: Secondary | ICD-10-CM | POA: Diagnosis not present

## 2020-05-03 DIAGNOSIS — M1711 Unilateral primary osteoarthritis, right knee: Secondary | ICD-10-CM | POA: Diagnosis not present

## 2020-05-03 DIAGNOSIS — M25511 Pain in right shoulder: Secondary | ICD-10-CM | POA: Diagnosis not present

## 2020-05-03 DIAGNOSIS — K760 Fatty (change of) liver, not elsewhere classified: Secondary | ICD-10-CM | POA: Diagnosis not present

## 2020-05-03 DIAGNOSIS — I82412 Acute embolism and thrombosis of left femoral vein: Secondary | ICD-10-CM | POA: Diagnosis not present

## 2020-05-03 DIAGNOSIS — G7249 Other inflammatory and immune myopathies, not elsewhere classified: Secondary | ICD-10-CM | POA: Diagnosis not present

## 2020-05-03 DIAGNOSIS — M4807 Spinal stenosis, lumbosacral region: Secondary | ICD-10-CM | POA: Diagnosis not present

## 2020-05-03 DIAGNOSIS — G8929 Other chronic pain: Secondary | ICD-10-CM | POA: Diagnosis not present

## 2020-05-04 ENCOUNTER — Encounter: Payer: Self-pay | Admitting: Family Medicine

## 2020-05-04 NOTE — Assessment & Plan Note (Signed)
Home safety and fall risk reductio discussed briefly esp with addition of hydroxyzine short term. Son aware and I have recommended   night time supervision short term

## 2020-05-04 NOTE — Progress Notes (Signed)
Virtual Visit via video I connected with [name] [name] on 05/04/20 at  2:20 PM EST by video  Location: Patient: home Provider: office     History of Present Illness:   4 day h/o itchy rash on right neck, blisters, spreading towards right chest and posterior neck and under right ear. No pus draining, some of the skin appears red, no fever or hills. No prior h/o shingles, has likely had childhood chicken pox Observations/Objective: There were no vitals taken for this visit. Good communication with no confusion and intact memory. Alert and oriented x 3 No signs of respiratory distress during speech  Vesicular rash on right anterior and posterior neck, anterior to right ear, and also extending toward right anterior chest. No visible spread toward eye Mild crusting and erythema of skin also noted  Assessment and Plan: Zoster Right ear, right posterior neck and right jaw, rx acyclovir, doxycycline , hydroxyzine, f/U in 1 week  At high risk for falls per [name] fall risk assessment scale Home safety and fall risk reductio discussed briefly esp with addition of hydroxyzine short term. Son aware and I have recommended   night time supervision short term    Follow Up Instructions:    I discussed the assessment and treatment plan with the patient. The patient was provided an opportunity to ask questions and all were answered. The patient agreed with the plan and demonstrated an understanding of the instructions.   The patient was advised to call back or seek an in-person evaluation if the symptoms worsen or if the condition fails to improve as anticipated.  I provided 15 minutes of face-to-face time during this encounter.   [name], MD

## 2020-05-06 DIAGNOSIS — G629 Polyneuropathy, unspecified: Secondary | ICD-10-CM | POA: Diagnosis not present

## 2020-05-06 DIAGNOSIS — M25512 Pain in left shoulder: Secondary | ICD-10-CM | POA: Diagnosis not present

## 2020-05-06 DIAGNOSIS — G8929 Other chronic pain: Secondary | ICD-10-CM | POA: Diagnosis not present

## 2020-05-06 DIAGNOSIS — I82412 Acute embolism and thrombosis of left femoral vein: Secondary | ICD-10-CM | POA: Diagnosis not present

## 2020-05-06 DIAGNOSIS — M1711 Unilateral primary osteoarthritis, right knee: Secondary | ICD-10-CM | POA: Diagnosis not present

## 2020-05-06 DIAGNOSIS — K760 Fatty (change of) liver, not elsewhere classified: Secondary | ICD-10-CM | POA: Diagnosis not present

## 2020-05-06 DIAGNOSIS — M25511 Pain in right shoulder: Secondary | ICD-10-CM | POA: Diagnosis not present

## 2020-05-06 DIAGNOSIS — M4807 Spinal stenosis, lumbosacral region: Secondary | ICD-10-CM | POA: Diagnosis not present

## 2020-05-06 DIAGNOSIS — G7249 Other inflammatory and immune myopathies, not elsewhere classified: Secondary | ICD-10-CM | POA: Diagnosis not present

## 2020-05-07 ENCOUNTER — Telehealth (INDEPENDENT_AMBULATORY_CARE_PROVIDER_SITE_OTHER): Payer: Medicare HMO | Admitting: Family Medicine

## 2020-05-07 ENCOUNTER — Other Ambulatory Visit: Payer: Self-pay

## 2020-05-07 VITALS — Ht 65.0 in | Wt 152.0 lb

## 2020-05-07 DIAGNOSIS — B029 Zoster without complications: Secondary | ICD-10-CM

## 2020-05-07 DIAGNOSIS — E785 Hyperlipidemia, unspecified: Secondary | ICD-10-CM | POA: Diagnosis not present

## 2020-05-07 DIAGNOSIS — E559 Vitamin D deficiency, unspecified: Secondary | ICD-10-CM | POA: Diagnosis not present

## 2020-05-07 DIAGNOSIS — I10 Essential (primary) hypertension: Secondary | ICD-10-CM

## 2020-05-07 NOTE — Patient Instructions (Addendum)
Please reschedule annual exam to last week in August or early September, call if you need me sooner  Please get fasting lipid, TSH and vit D level in the next 1 to 2 weeks at our  office  Be careful not to fall.  Thankful shingles is   much improved, not too much tylenol for pain as your liver enzymes are  Above normal range   Think about what you will eat, plan ahead. Choose " clean, green, fresh or frozen" over canned, processed or packaged foods which are more sugary, salty and fatty. 70 to 75% of food eaten should be vegetables and fruit. Three meals at set times with snacks allowed between meals, but they must be fruit or vegetables. Aim to eat over a 12 hour period , example 7 am to 7 pm, and STOP after  your last meal of the day. Drink water,generally about 64 ounces per day, no other drink is as healthy. Fruit juice is best enjoyed in a healthy way, by EATING the fruit.  Thanks for choosing [facility], we consider it a privelige to serve you.

## 2020-05-07 NOTE — Progress Notes (Deleted)
Subjective: No diagnosis found.   I leak when I have the urge to urinate.  HPI: [name] [name] is a 87 year-old female established patient who is here for urge incontinence.  She does have problems getting to the bathroom in time after she has the urge to urinate. She has had an accident when she couldn't get to the bathroom in time . She has 5 episodes of urge incontinence per day. Her urge incontinence began approximately 02/21/2011. Her symptoms have gotten worse over the last year.   She does wear protective pads. She wears 3-4 pads per day. She generally urinates every 3 hours in the daytime. She gets up at night to urinate 1 time. She is not having problems with emptying her bladder well.   03/02/2018: For over 7 years she has had worsening urgency and urge incontinence. She has SUI but is more bothered by her UUI. She has spinal stenosis and has to walk with a cane. NO hx of UTIs. She has issues with intermittent constipation and she takes linzess.   04/13/2018: She tried mirabegron 39m which failed to improve to urgency or urge incontinence. It gave her headaches and dizziness.     ROS:  ROS  Allergies  Allergen Reactions  . Cellcept [Mycophenolate Mofetil]     Muscle cramps, mouth soreness, difficulty swallowing, bruising  . Statins     Dx of in  . Ciprofloxacin Hcl Other (See Comments)    Chills, sick, could not tolerate it  . Hydrocodone Nausea Only  . Penicillins Rash    Past Medical History:  Diagnosis Date  . Acute deep vein thrombosis (DVT) of left femoral vein ([place] 02/07/2019   Dx 02/05/2019  . Arthritis    spinal stenosis  . Deep vein thrombosis (DVT) of left lower extremity ([facility] 08/04/2019  . Elevated LFTs    secondary to fatty liver, negative work-up in 2011  . Hypercholesterolemia   . Hypertension   . Lumbar spinal stenosis 01/14/2018   L3-4 and L4-5  . Myopathy 02/15/2018  . Osteoarthritis    right knee  . Peripheral neuropathy 07/06/2018  . PONV  (postoperative nausea and vomiting)   . [place]terine cancer ([place] 08/2006   grade 1, no recurrence up to 2013    Past Surgical History:  Procedure Laterality Date  . 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 APPENDECTOMY  1973  . BREAST BIOPSY Left 2018   Benign  . CHOLECYSTECTOMY  1973  . COLONOSCOPY    06/21/2007   RKGY:JEHUDJrectum/Sigmoid diverticula, diminutive hepatic flexure polyp s/p bx. Benign.   . COLONOSCOPY N/A 10/17/2012   Dr. FOneida Alar moderate diverticulosis was noted in the sigmoid colon, moderate sized internal hemorrhoids. next tcs in10 years  . ESOPHAGOGASTRODUODENOSCOPY N/A 03/20/2016   Dr. FOneida Alar widely patent Schatzki ring, anemia due to ASA induced erosive gastritis, mild duodenitis. gastric bx benign without H.pylori.   .Marland KitchenFOOT SURGERY  2007   Pins in toes on left foot, 5 to 6 yrs ago  . GIVENS CAPSULE STUDY N/A 03/20/2016   Procedure: GIVENS CAPSULE STUDY;  Surgeon: SDanie Binder MD;  [facility];  Service: Endoscopy;  Laterality: N/A;  . MUSCLE BIOPSY Left 06/07/2018   Procedure: LEFT THIGH MUSCLE BIOPSY;  Surgeon: CErroll Luna MD;  [facility]  Service: General;  Laterality: Left;  . TONSILLECTOMY      Social History   Socioeconomic History  . Marital status: Widowed  Spouse name: Jeneen Rinks   . Number of children: 3  . Years of education: 67  . Highest education level: 12th grade  Occupational History  . Occupation: retired   Tobacco Use  . Smoking status: Never Smoker  . Smokeless tobacco: Never Used  . Tobacco comment: Never smoked  Vaping Use  . Vaping Use: Never used  Substance and Sexual Activity  . Alcohol use: No  . Drug use: No  . Sexual activity: Not Currently    Birth control/protection: Post-menopausal, Surgical  Other Topics Concern  . Not on file  Social History Narrative   Husband Jeneen Rinks recently passed away   Caffeine use: soda daily   Right handed    Social Determinants of  Health   Financial Resource Strain: Low Risk   . Difficulty of Paying Living Expenses: Not very hard  Food Insecurity: No Food Insecurity  . Worried About Charity fundraiser in the Last Year: Never true  . Ran Out of Food in the Last Year: Never true  Transportation Needs: No Transportation Needs  . Lack of Transportation (Medical): No  . Lack of Transportation (Non-Medical): No  Physical Activity: Inactive  . Days of Exercise per Week: 0 days  . Minutes of Exercise per Session: 0 min  Stress: Stress Concern Present  . Feeling of Stress : To some extent  Social Connections: Socially Isolated  . Frequency of Communication with Friends and Family: More than three times a week  . Frequency of Social Gatherings with Friends and Family: Once a week  . Attends Religious Services: Never  . Active Member of Clubs or Organizations: No  . Attends Archivist Meetings: Never  . Marital Status: Widowed  Intimate Partner Violence: Not At Risk  . Fear of Current or Ex-Partner: No  . Emotionally Abused: No  . Physically Abused: No  . Sexually Abused: No    Family History  Problem Relation Age of Onset  . Heart disease Father   . Bladder Cancer Brother        in remission   . Hypertension Sister   . Dementia Sister   . Diabetes type II Sister   . Hypertension Sister   . Aneurysm Sister   . Hypertension Brother   . Arthritis Brother   . Hypertension Son   . Colon cancer Neg Hx     Anti-infectives: Anti-infectives (From admission, onward)   None      Current Outpatient Medications  Medication Sig Dispense Refill  . acetaminophen (TYLENOL) 650 MG CR tablet Take 650 mg by mouth every 6 (six) hours.     Marland Kitchen acyclovir (ZOVIRAX) 800 MG tablet Take 1 tablet (800 mg total) by mouth 5 (five) times daily. 50 tablet 0  . doxycycline (VIBRA-TABS) 100 MG tablet Take 1 tablet (100 mg total) by mouth 2 (two) times daily. 14 tablet 0  . ELIQUIS 5 MG TABS tablet TAKE 1 TABLET TWICE  DAILY 180 tablet 2  . ezetimibe (ZETIA) 10 MG tablet TAKE 1 TABLET EVERY DAY 90 tablet 1  . folic acid (FOLVITE) 1 MG tablet Take 1 tablet (1 mg total) by mouth daily. 90 tablet 3  . furosemide (LASIX) 20 MG tablet Take 1 tablet (20 mg total) by mouth daily. 90 tablet 1  . hydrOXYzine (ATARAX/VISTARIL) 10 MG tablet Take one tablet at bedtime , as needed, for itching 14 tablet 0  . magnesium oxide (MAG-OX) 400 (241.3 Mg) MG tablet Take 1 tablet (400 mg total) by  mouth 2 (two) times daily. 180 tablet 1  . methocarbamol (ROBAXIN) 500 MG tablet Take 1 tablet (500 mg total) by mouth at bedtime as needed for muscle spasms. May cause drowsiness 8 tablet 0  . methotrexate (RHEUMATREX) 2.5 MG tablet Take 4 tablets (10 mg total) by mouth once a week. Caution:Chemotherapy. Protect from light. 16 tablet 5  . NON FORMULARY Fluid restriction of 1.8 liters per day (1735m total) 7-3 = 800 ml 3-11 = 800 ml 11-7 = 175 ml Document totals qshift. Every Shift Day, Evening, Nigh    . NON FORMULARY Diet: _____ Regular, __x____ NAS, _______Consistent Carbohydrate, _______NPO _____Other    . pantoprazole (PROTONIX) 40 MG tablet Take 1 tablet (40 mg total) by mouth daily. 30 tablet 0  . predniSONE (DELTASONE) 5 MG tablet Take 2 tablets (10 mg total) by mouth daily with breakfast. 180 tablet 1  . triamterene-hydrochlorothiazide (MAXZIDE-25) 37.5-25 MG tablet Take 1 tablet by mouth daily. 90 tablet 3   No current facility-administered medications for this visit.     Objective: Vital signs in last 24 hours: There were no vitals taken for this visit.  Intake/Output from previous day: No intake/output data recorded. Intake/Output this shift: @IOTHISSHIFT @   Physical Exam  Lab Results:  No results found for this or any previous visit (from the past 24 hour(s)).  BMET No results for input(s): NA, K, CL, CO2, GLUCOSE, BUN, CREATININE, CALCIUM in the last 72 hours. PT/INR No results for input(s): LABPROT,  INR in the last 72 hours. ABG No results for input(s): PHART, HCO3 in the last 72 hours.  Invalid input(s): PCO2, PO2  Studies/Results: No results found.   Assessment/Plan: No problem-specific Assessment & Plan notes found for this encounter.   No orders of the defined types were placed in this encounter.    No orders of the defined types were placed in this encounter.    No follow-ups on file.    CC: ***     JIrine Seal2/15/2022 [phone]

## 2020-05-07 NOTE — Progress Notes (Signed)
Virtual Visit via Telephone Note  I connected with [name] [name] on 05/07/20 at  2:20 PM EST by telephone and verified that I am speaking with the correct person using two identifiers.  Location: Patient: home Provider: office   I discussed the limitations, risks, security and privacy concerns of performing an evaluation and management service by telephone and the availability of in person appointments. I also discussed with the patient that there may be a patient responsible charge related to this service. The patient expressed understanding and agreed to proceed.   History of Present Illness: F/U shingles, much improved,bumps are dried up and I don't have much pain or burning. I am not taking gabapentin because of side effects, skin just feels dry and itcy. I have no fever . I feel fine.My appetite and energy are back to normal My eye was not affected   Observations/Objective: Ht 5' 5"  (1.651 m)   Wt 152 lb (68.9 kg)   BMI 25.29 kg/m  Good communication with no confusion and intact memory. Alert and oriented x 3 No signs of respiratory distress during speech   Assessment and Plan: Zoster current rash is " dry" no fever, chills or purulent drainage,marked improved clinically, no new rash, pt to complete antiviral and has opted not to take gabapentin, denies any current pain    Follow Up Instructions:    I discussed the assessment and treatment plan with the patient. The patient was provided an opportunity to ask questions and all were answered. The patient agreed with the plan and demonstrated an understanding of the instructions.   The patient was advised to call back or seek an in-person evaluation if the symptoms worsen or if the condition fails to improve as anticipated.  I provided 8 minutes of non-face-to-face time during this encounter.   [name], MD

## 2020-05-09 ENCOUNTER — Ambulatory Visit: Payer: Medicare HMO | Admitting: Urology

## 2020-05-10 ENCOUNTER — Encounter: Payer: Self-pay | Admitting: Family Medicine

## 2020-05-10 DIAGNOSIS — G7249 Other inflammatory and immune myopathies, not elsewhere classified: Secondary | ICD-10-CM | POA: Diagnosis not present

## 2020-05-10 DIAGNOSIS — M25511 Pain in right shoulder: Secondary | ICD-10-CM | POA: Diagnosis not present

## 2020-05-10 DIAGNOSIS — M25512 Pain in left shoulder: Secondary | ICD-10-CM | POA: Diagnosis not present

## 2020-05-10 DIAGNOSIS — K760 Fatty (change of) liver, not elsewhere classified: Secondary | ICD-10-CM | POA: Diagnosis not present

## 2020-05-10 DIAGNOSIS — M1711 Unilateral primary osteoarthritis, right knee: Secondary | ICD-10-CM | POA: Diagnosis not present

## 2020-05-10 DIAGNOSIS — M4807 Spinal stenosis, lumbosacral region: Secondary | ICD-10-CM | POA: Diagnosis not present

## 2020-05-10 DIAGNOSIS — G8929 Other chronic pain: Secondary | ICD-10-CM | POA: Diagnosis not present

## 2020-05-10 DIAGNOSIS — I82412 Acute embolism and thrombosis of left femoral vein: Secondary | ICD-10-CM | POA: Diagnosis not present

## 2020-05-10 DIAGNOSIS — G629 Polyneuropathy, unspecified: Secondary | ICD-10-CM | POA: Diagnosis not present

## 2020-05-10 NOTE — Assessment & Plan Note (Signed)
current rash is " dry" no fever, chills or purulent drainage,marked improved clinically, no new rash, pt to complete antiviral and has opted not to take gabapentin, denies any current pain

## 2020-05-15 DIAGNOSIS — M1711 Unilateral primary osteoarthritis, right knee: Secondary | ICD-10-CM | POA: Diagnosis not present

## 2020-05-15 DIAGNOSIS — K760 Fatty (change of) liver, not elsewhere classified: Secondary | ICD-10-CM | POA: Diagnosis not present

## 2020-05-15 DIAGNOSIS — I82412 Acute embolism and thrombosis of left femoral vein: Secondary | ICD-10-CM | POA: Diagnosis not present

## 2020-05-15 DIAGNOSIS — G8929 Other chronic pain: Secondary | ICD-10-CM | POA: Diagnosis not present

## 2020-05-15 DIAGNOSIS — G7249 Other inflammatory and immune myopathies, not elsewhere classified: Secondary | ICD-10-CM | POA: Diagnosis not present

## 2020-05-15 DIAGNOSIS — G629 Polyneuropathy, unspecified: Secondary | ICD-10-CM | POA: Diagnosis not present

## 2020-05-15 DIAGNOSIS — M25511 Pain in right shoulder: Secondary | ICD-10-CM | POA: Diagnosis not present

## 2020-05-15 DIAGNOSIS — M25512 Pain in left shoulder: Secondary | ICD-10-CM | POA: Diagnosis not present

## 2020-05-15 DIAGNOSIS — M4807 Spinal stenosis, lumbosacral region: Secondary | ICD-10-CM | POA: Diagnosis not present

## 2020-05-20 ENCOUNTER — Other Ambulatory Visit: Payer: Self-pay | Admitting: Internal Medicine

## 2020-05-20 ENCOUNTER — Telehealth: Payer: Self-pay

## 2020-05-20 ENCOUNTER — Encounter: Payer: Medicare HMO | Admitting: Family Medicine

## 2020-05-20 DIAGNOSIS — B028 Zoster with other complications: Secondary | ICD-10-CM

## 2020-05-20 MED ORDER — GABAPENTIN 100 MG PO CAPS: 100.0000 mg | ORAL_CAPSULE | Freq: Two times a day (BID) | ORAL | 0 refills | Status: DC

## 2020-05-20 NOTE — Telephone Encounter (Signed)
Pt had a video visit with Dr. Moshe Cipro last week for shingles. They were not bothering her then but she has been in a lot of pain the past few days. She said they are on the R side of her shoulder going up to her ear. It feels tingly and she has numbness. She wants to know if something can be called in or if she should take Gabapentin that she has? Please advise.

## 2020-05-20 NOTE — Telephone Encounter (Signed)
Pt says she looked at her Rx bottle and the Gabapentin is old. Can you call her in a new Rx. [facility] in [place].

## 2020-05-20 NOTE — Telephone Encounter (Signed)
Sent. Thanks.   

## 2020-05-20 NOTE — Telephone Encounter (Signed)
Okay to take Gabapentin - it should help with neuropathic pain/burning.

## 2020-05-20 NOTE — Progress Notes (Signed)
[place]

## 2020-05-21 DIAGNOSIS — M4807 Spinal stenosis, lumbosacral region: Secondary | ICD-10-CM | POA: Diagnosis not present

## 2020-05-21 DIAGNOSIS — M25512 Pain in left shoulder: Secondary | ICD-10-CM | POA: Diagnosis not present

## 2020-05-21 DIAGNOSIS — G8929 Other chronic pain: Secondary | ICD-10-CM | POA: Diagnosis not present

## 2020-05-21 DIAGNOSIS — M25511 Pain in right shoulder: Secondary | ICD-10-CM | POA: Diagnosis not present

## 2020-05-21 DIAGNOSIS — G7249 Other inflammatory and immune myopathies, not elsewhere classified: Secondary | ICD-10-CM | POA: Diagnosis not present

## 2020-05-21 DIAGNOSIS — M1711 Unilateral primary osteoarthritis, right knee: Secondary | ICD-10-CM | POA: Diagnosis not present

## 2020-05-21 DIAGNOSIS — G629 Polyneuropathy, unspecified: Secondary | ICD-10-CM | POA: Diagnosis not present

## 2020-05-21 DIAGNOSIS — I82412 Acute embolism and thrombosis of left femoral vein: Secondary | ICD-10-CM | POA: Diagnosis not present

## 2020-05-21 DIAGNOSIS — K760 Fatty (change of) liver, not elsewhere classified: Secondary | ICD-10-CM | POA: Diagnosis not present

## 2020-05-28 ENCOUNTER — Other Ambulatory Visit: Payer: Self-pay

## 2020-05-28 ENCOUNTER — Encounter: Payer: Self-pay | Admitting: Family Medicine

## 2020-05-28 ENCOUNTER — Telehealth (INDEPENDENT_AMBULATORY_CARE_PROVIDER_SITE_OTHER): Payer: Medicare HMO | Admitting: Family Medicine

## 2020-05-28 DIAGNOSIS — B0229 Other postherpetic nervous system involvement: Secondary | ICD-10-CM | POA: Diagnosis not present

## 2020-05-28 MED ORDER — GABAPENTIN 100 MG PO CAPS: ORAL_CAPSULE | ORAL | 3 refills | Status: DC

## 2020-05-28 MED ORDER — LIDOCAINE 5 % EX PTCH
3.0000 | MEDICATED_PATCH | CUTANEOUS | 3 refills | Status: DC
Start: 2020-05-28 — End: 2020-11-20

## 2020-05-28 NOTE — Progress Notes (Signed)
Virtual Visit via Telephone Note  I connected with [name] [name] on 05/28/20 at  3:40 PM EST by telephone and verified that I am speaking with the correct person using two identifiers.  Location: Patient: home Provider: office   I discussed the limitations, risks, security and privacy concerns of performing an evaluation and management service by telephone and the availability of in person appointments. I also discussed with the patient that there may be a patient responsible charge related to this service. The patient expressed understanding and agreed to proceed.   History of Present Illness: C/o severe burning pain on right cheek, below right ear , down right side of neck to anterior chest on right, involving the entire area where she recently had shingles. Rash has dried I up,pain is severe at a 10 interfering with all aspects of her life , no sleep, gabapentin ineffective   Observations/Objective: There were no vitals taken for this visit. Good communication with no confusion and intact memory. Alert and oriented x 3 No signs of respiratory distress during speech    Assessment and Plan: HZV (herpes zoster virus) post herpetic neuralgia Uncontrolled, severe disabling burning pain, incrase gabapentin dose and add lidoderm patches F/u in 3 to 4 weeks    Follow Up Instructions:    I discussed the assessment and treatment plan with the patient. The patient was provided an opportunity to ask questions and all were answered. The patient agreed with the plan and demonstrated an understanding of the instructions.   The patient was advised to call back or seek an in-person evaluation if the symptoms worsen or if the condition fails to improve as anticipated.  I provided 10 minutes of non-face-to-face time during this encounter.   [name], MD

## 2020-05-28 NOTE — Patient Instructions (Addendum)
F/Ushinglesin 5 weeks, call if you need me sooner  New higher dose of gabapentin, one in the morning and two at bedtime  New additional medication for pain of shinglesin Lidoderm patch , use as directed, keep patch on for only 12 hours each day,and apply a new patch every day   Be carefulnot to fall!   Postherpetic Neuralgia Postherpetic neuralgia (PHN) is nerve pain that occurs after a shingles infection. Shingles is a painful rash that appears on one area of the body, usually on the trunk or face. Shingles is caused by the varicella-zoster virus. This is the same virus that causes chickenpox. In people who have had chickenpox, the virus can resurface years later and cause shingles. You may have PHN if you continue to have pain for 4 months after your shingles rash has gone away. PHN appears in the same area where you had the shingles rash. The pain usually goes away after the rash disappears. Getting a vaccination for shingles can prevent PHN. This vaccine is recommended for people older than 60. It may prevent shingles, and may also lower your risk of PHN if you do get shingles. What are the causes? This condition is caused by damage to your nerves from the varicella-zoster virus. The damage makes your nerves overly sensitive. What increases the risk? The following factors may make you more likely to develop this condition:  Being older than 84 years of age.  Having severe pain before your shingles rash starts.  Having a severe rash.  Having shingles in and around the eye area.  Having a disease that makes your body unable to fight infections (weak immune system). What are the signs or symptoms? The main symptom of this condition is pain. The pain may:  Often be very bad and may be described as stabbing, burning, or feeling like an electric shock.  Come and go or may be there all the time.  Be triggered by light touches on the skin or changes in temperature. You may have itching  along with the pain. How is this diagnosed? This condition may be diagnosed based on your symptoms and your history of shingles. Lab studies and other diagnostic tests are usually not needed. How is this treated? There is no cure for this condition. Treatment for PHN will focus on pain relief. Over-the-counter pain relievers do not usually relieve PHN pain. You may need to work with a pain specialist. Treatment may include:  Antidepressant medicines to help with pain and improve sleep.  Anti-seizure medicines to relieve nerve pain.  Strong pain relievers (opioids).  A numbing patch worn on the skin (lidocaine patch).  Botox (botulinum toxin) injections to block pain signals between nerves and muscles.  Injections of numbing medicine or anti-inflammatory medicines around irritated nerves. Follow these instructions at home:  It may take a long time to recover from PHN. Work closely with your health care provider and develop a good support system at home.  Take over-the-counter and prescription medicines only as told by your health care provider.  Do not drive or use heavy machinery while taking prescription pain medicine.  Wear loose, comfortable clothing.  Cover sensitive areas with a dressing to reduce friction from clothing rubbing on the area.  If directed, put ice on the painful area: ? Put ice in a plastic bag. ? Place a towel between your skin and the bag. ? Leave the ice on for 20 minutes, 2-3 times a day.  Talk to your health care provider  if you feel depressed or desperate. Living with long-term pain can be depressing.  Keep all follow-up visits as told by your health care provider. This is important.   Contact a health care provider if:  Your medicine is not helping.  You are struggling to manage your pain at home. Summary  Postherpetic neuralgia is a very painful disorder that can occur after an episode of shingles.  The pain is often severe, burning, electric,  or stabbing.  Prescription medicines can be helpful in managing persistent pain.  Getting a vaccination for shingles can prevent PHN. This vaccine is recommended for people older than 60. This information is not intended to replace advice given to you by your health care provider. Make sure you discuss any questions you have with your health care provider. Document Revised: 02/19/2017 Document Reviewed: 05/26/2016 Elsevier Patient Education  [place].

## 2020-05-29 ENCOUNTER — Other Ambulatory Visit: Payer: Self-pay

## 2020-05-30 ENCOUNTER — Other Ambulatory Visit (HOSPITAL_COMMUNITY)
Admission: RE | Admit: 2020-05-30 | Discharge: 2020-05-30 | Disposition: A | Discharge status: Home / Self Care | Payer: Medicare HMO | Source: Ambulatory Visit | Attending: Family Medicine | Admitting: Family Medicine

## 2020-05-30 ENCOUNTER — Other Ambulatory Visit: Payer: Self-pay

## 2020-05-30 ENCOUNTER — Inpatient Hospital Stay (HOSPITAL_COMMUNITY): Payer: Medicare HMO | Attending: Hematology

## 2020-05-30 DIAGNOSIS — Z8249 Family history of ischemic heart disease and other diseases of the circulatory system: Secondary | ICD-10-CM | POA: Insufficient documentation

## 2020-05-30 DIAGNOSIS — Z7901 Long term (current) use of anticoagulants: Secondary | ICD-10-CM | POA: Insufficient documentation

## 2020-05-30 DIAGNOSIS — D649 Anemia, unspecified: Secondary | ICD-10-CM

## 2020-05-30 DIAGNOSIS — G7249 Other inflammatory and immune myopathies, not elsewhere classified: Secondary | ICD-10-CM | POA: Diagnosis not present

## 2020-05-30 DIAGNOSIS — Z8052 Family history of malignant neoplasm of bladder: Secondary | ICD-10-CM | POA: Insufficient documentation

## 2020-05-30 DIAGNOSIS — Z833 Family history of diabetes mellitus: Secondary | ICD-10-CM | POA: Diagnosis not present

## 2020-05-30 DIAGNOSIS — M4807 Spinal stenosis, lumbosacral region: Secondary | ICD-10-CM | POA: Diagnosis not present

## 2020-05-30 DIAGNOSIS — G629 Polyneuropathy, unspecified: Secondary | ICD-10-CM | POA: Diagnosis not present

## 2020-05-30 DIAGNOSIS — I10 Essential (primary) hypertension: Secondary | ICD-10-CM | POA: Diagnosis not present

## 2020-05-30 DIAGNOSIS — Z8261 Family history of arthritis: Secondary | ICD-10-CM | POA: Insufficient documentation

## 2020-05-30 DIAGNOSIS — Z90711 Acquired absence of uterus with remaining cervical stump: Secondary | ICD-10-CM | POA: Diagnosis not present

## 2020-05-30 DIAGNOSIS — G8929 Other chronic pain: Secondary | ICD-10-CM | POA: Diagnosis not present

## 2020-05-30 DIAGNOSIS — M25512 Pain in left shoulder: Secondary | ICD-10-CM | POA: Diagnosis not present

## 2020-05-30 DIAGNOSIS — Z86718 Personal history of other venous thrombosis and embolism: Secondary | ICD-10-CM | POA: Diagnosis not present

## 2020-05-30 DIAGNOSIS — Z79899 Other long term (current) drug therapy: Secondary | ICD-10-CM | POA: Insufficient documentation

## 2020-05-30 DIAGNOSIS — M1711 Unilateral primary osteoarthritis, right knee: Secondary | ICD-10-CM | POA: Diagnosis not present

## 2020-05-30 DIAGNOSIS — Z8542 Personal history of malignant neoplasm of other parts of uterus: Secondary | ICD-10-CM | POA: Diagnosis not present

## 2020-05-30 DIAGNOSIS — I82412 Acute embolism and thrombosis of left femoral vein: Secondary | ICD-10-CM

## 2020-05-30 DIAGNOSIS — K760 Fatty (change of) liver, not elsewhere classified: Secondary | ICD-10-CM | POA: Diagnosis not present

## 2020-05-30 DIAGNOSIS — M25511 Pain in right shoulder: Secondary | ICD-10-CM | POA: Diagnosis not present

## 2020-05-30 LAB — CBC WITH DIFFERENTIAL/PLATELET
Abs Immature Granulocytes: 0.03 10*3/uL (ref 0.00–0.07)
Basophils Absolute: 0 10*3/uL (ref 0.0–0.1)
Basophils Relative: 1 %
Eosinophils Absolute: 0.1 10*3/uL (ref 0.0–0.5)
Eosinophils Relative: 2 %
HCT: 43 % (ref 36.0–46.0)
Hemoglobin: 13.5 g/dL (ref 12.0–15.0)
Immature Granulocytes: 1 %
Lymphocytes Relative: 16 %
Lymphs Abs: 1.1 10*3/uL (ref 0.7–4.0)
MCH: 31.3 pg (ref 26.0–34.0)
MCHC: 31.4 g/dL (ref 30.0–36.0)
MCV: 99.8 fL (ref 80.0–100.0)
Monocytes Absolute: 0.6 10*3/uL (ref 0.1–1.0)
Monocytes Relative: 9 %
Neutro Abs: 4.5 10*3/uL (ref 1.7–7.7)
Neutrophils Relative %: 71 %
Platelets: 231 10*3/uL (ref 150–400)
RBC: 4.31 MIL/uL (ref 3.87–5.11)
RDW: 14.9 % (ref 11.5–15.5)
WBC: 6.4 10*3/uL (ref 4.0–10.5)
nRBC: 0 % (ref 0.0–0.2)

## 2020-05-30 LAB — LIPID PANEL
Cholesterol: 235 mg/dL — ABNORMAL HIGH (ref 0–200)
HDL: 110 mg/dL (ref >40)
LDL Cholesterol: 107 mg/dL — ABNORMAL HIGH (ref 0–99)
Total CHOL/HDL Ratio: 2.1 RATIO
Triglycerides: 92 mg/dL (ref <150)
VLDL: 18 mg/dL (ref 0–40)

## 2020-05-30 LAB — IRON AND TIBC
Iron: 121 ug/dL (ref 28–170)
Saturation Ratios: 34 % — ABNORMAL HIGH (ref 10.4–31.8)
TIBC: 353 ug/dL (ref 250–450)
UIBC: 232 ug/dL

## 2020-05-30 LAB — VITAMIN D 25 HYDROXY (VIT D DEFICIENCY, FRACTURES): Vit D, 25-Hydroxy: 43.23 ng/mL (ref 30–100)

## 2020-05-30 LAB — FERRITIN: Ferritin: 120 ng/mL (ref 11–307)

## 2020-05-30 LAB — D-DIMER, QUANTITATIVE: D-Dimer, Quant: 0.47 ug/mL-FEU (ref 0.00–0.50)

## 2020-05-30 LAB — FOLATE: Folate: 23 ng/mL (ref >5.9)

## 2020-05-30 LAB — TSH: TSH: 2.489 u[IU]/mL (ref 0.350–4.500)

## 2020-05-31 ENCOUNTER — Encounter: Payer: Self-pay | Admitting: Family Medicine

## 2020-05-31 DIAGNOSIS — H5200 Hypermetropia, unspecified eye: Secondary | ICD-10-CM | POA: Diagnosis not present

## 2020-05-31 DIAGNOSIS — B0229 Other postherpetic nervous system involvement: Secondary | ICD-10-CM | POA: Insufficient documentation

## 2020-05-31 DIAGNOSIS — Z0100 Encounter for examination of eyes and vision without abnormal findings: Secondary | ICD-10-CM | POA: Diagnosis not present

## 2020-05-31 NOTE — Assessment & Plan Note (Signed)
Uncontrolled, severe disabling burning pain, incrase gabapentin dose and add lidoderm patches F/u in 3 to 4 weeks

## 2020-06-06 ENCOUNTER — Ambulatory Visit (HOSPITAL_COMMUNITY): Payer: Medicare HMO | Admitting: Hematology

## 2020-06-06 ENCOUNTER — Inpatient Hospital Stay (HOSPITAL_BASED_OUTPATIENT_CLINIC_OR_DEPARTMENT_OTHER): Payer: Medicare HMO | Admitting: Oncology

## 2020-06-06 ENCOUNTER — Other Ambulatory Visit: Payer: Self-pay

## 2020-06-06 DIAGNOSIS — I82412 Acute embolism and thrombosis of left femoral vein: Secondary | ICD-10-CM | POA: Diagnosis not present

## 2020-06-06 DIAGNOSIS — D649 Anemia, unspecified: Secondary | ICD-10-CM | POA: Diagnosis not present

## 2020-06-06 NOTE — Progress Notes (Signed)
[address], [place] [address]   CLINIC:  Medical Oncology/Hematology  PCP:  [name] Helper, MD [address], [place] / [place] [address]  [phone]  REASON FOR VISIT:  Follow-up for normocytic anemia & DVT of left femoral vein  PRIOR THERAPY: None  CURRENT THERAPY: Eliquis daily; iron tablet twice weekly  I connected with [name] [name] on 06/06/20 at 11:10 AM EDT by telephone visit and verified that I am speaking with the correct person using two identifiers.   I discussed the limitations, risks, security and privacy concerns of performing an evaluation and management service by telemedicine and the availability of in-person appointments. I also discussed with the patient that there may be a patient responsible charge related to this service. The patient expressed understanding and agreed to proceed.   Patient's location: Home  Provider's location: Clinic   INTERVAL HISTORY:  [name] [name], a 87 y.o. female, returns for routine follow-up for her normocytic anemia and DVT of left femoral vein. [name] [name] was last seen on 01/30/2020.  In the interim, she was evaluated in the ED for left lower extremity pain.  Work-up included Doppler which was negative for recurrent DVT.   She was diagnosed with shingles and postherpetic neuralgia on 04/30/20 and prescribed antiviral, doxycycline, hydroxyzine and most recently gabapentin and Lidoderm patches.  She reports joint pain and is status post knee replacement surgery.  She has had steroid injections done by Dr. Koleen Nimrod in the past which do not appear to be helping.  She uses tramadol intermittently for pain.  She continues to have fairly significant pain from recent shingles.  She has been using gabapentin, topical creams and Lidoderm patches as prescribed with minimal improvement.  She has follow-up with PCP in the next week or so.  She continues iron tablets twice per week.  She denies any  constipation.  She continues Eliquis as prescribed and denies any bleeding episodes.  She continues magnesium supplements.  REVIEW OF SYSTEMS:  Review of Systems  Constitutional: Negative for appetite change, fatigue, fever and unexpected weight change.  HENT:   Negative for nosebleeds, sore throat and trouble swallowing.   Eyes: Negative.   Respiratory: Negative.  Negative for cough, shortness of breath and wheezing.   Cardiovascular: Negative.  Negative for chest pain and leg swelling.  Gastrointestinal: Negative for abdominal pain, blood in stool, constipation, diarrhea, nausea and vomiting.  Endocrine: Negative.   Genitourinary: Negative.  Negative for bladder incontinence, hematuria and nocturia.   Musculoskeletal: Negative.  Negative for back pain and flank pain.  Skin: Positive for itching and rash.       Right side of face/neck  Neurological: Negative.  Negative for dizziness, headaches, light-headedness and numbness.  Hematological: Negative.   Psychiatric/Behavioral: Negative.  Negative for confusion. The patient is not nervous/anxious.     PAST MEDICAL/SURGICAL HISTORY:  Past Medical History:  Diagnosis Date  . Acute deep vein thrombosis (DVT) of left femoral vein ([facility]) 02/07/2019   Dx 02/05/2019  . Arthritis    spinal stenosis  . Deep vein thrombosis (DVT) of left lower extremity ([facility]) 08/04/2019  . Elevated LFTs    secondary to fatty liver, negative work-up in 2011  . Hypercholesterolemia   . Hypertension   . Lumbar spinal stenosis 01/14/2018   L3-4 and L4-5  . Myopathy 02/15/2018  . Osteoarthritis    right knee  . Peripheral neuropathy 07/06/2018  . PONV (postoperative nausea and vomiting)   .  Uterine cancer ([facility]) 08/2006   grade 1, no recurrence up to 2013   Past Surgical History:  Procedure Laterality Date  . 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 APPENDECTOMY  1973  . BREAST BIOPSY Left 2018   Benign  . CHOLECYSTECTOMY  1973  .  COLONOSCOPY    06/21/2007   MBT:DHRCBU rectum/Sigmoid diverticula, diminutive hepatic flexure polyp s/p bx. Benign.   . COLONOSCOPY N/A 10/17/2012   Dr. Oneida Alar- moderate diverticulosis was noted in the sigmoid colon, moderate sized internal hemorrhoids. next tcs in10 years  . ESOPHAGOGASTRODUODENOSCOPY N/A 03/20/2016   Dr. Oneida Alar, widely patent Schatzki ring, anemia due to ASA induced erosive gastritis, mild duodenitis. gastric bx benign without H.pylori.   Marland Kitchen FOOT SURGERY  2007   Pins in toes on left foot, 5 to 6 yrs ago  . GIVENS CAPSULE STUDY N/A 03/20/2016   Procedure: GIVENS CAPSULE STUDY;  Surgeon: Danie Binder, MD;  Location: [facility];  Service: Endoscopy;  Laterality: N/A;  . MUSCLE BIOPSY Left 06/07/2018   Procedure: LEFT THIGH MUSCLE BIOPSY;  Surgeon: Erroll Luna, MD;  Location: [facility];  Service: General;  Laterality: Left;  . TONSILLECTOMY      SOCIAL HISTORY:  Social History   Socioeconomic History  . Marital status: Widowed    Spouse name: [name] [name]   . Number of children: 3  . Years of education: 64  . Highest education level: 12th grade  Occupational History  . Occupation: retired   Tobacco Use  . Smoking status: Never Smoker  . Smokeless tobacco: Never Used  . Tobacco comment: Never smoked  Vaping Use  . Vaping Use: Never used  Substance and Sexual Activity  . Alcohol use: No  . Drug use: No  . Sexual activity: Not Currently    Birth control/protection: Post-menopausal, Surgical  Other Topics Concern  . Not on file  Social History Narrative   Husband [name] [name] recently passed away   Caffeine use: soda daily   Right handed    Social Determinants of Health   Financial Resource Strain: Low Risk   . Difficulty of Paying Living Expenses: Not very hard  Food Insecurity: No Food Insecurity  . Worried About Charity fundraiser in the Last Year: Never true  . Ran Out of Food in the Last Year: Never true  Transportation Needs: No  Transportation Needs  . Lack of Transportation (Medical): No  . Lack of Transportation (Non-Medical): No  Physical Activity: Inactive  . Days of Exercise per Week: 0 days  . Minutes of Exercise per Session: 0 min  Stress: Stress Concern Present  . Feeling of Stress : To some extent  Social Connections: Socially Isolated  . Frequency of Communication with Friends and Family: More than three times a week  . Frequency of Social Gatherings with Friends and Family: Once a week  . Attends Religious Services: Never  . Active Member of Clubs or Organizations: No  . Attends Archivist Meetings: Never  . Marital Status: Widowed  Intimate Partner Violence: Not At Risk  . Fear of Current or Ex-Partner: No  . Emotionally Abused: No  . Physically Abused: No  . Sexually Abused: No    FAMILY HISTORY:  Family History  Problem Relation Age of Onset  . Heart disease Father   . Bladder Cancer Brother        in remission   . Hypertension Sister   . Dementia Sister   .  Diabetes type II Sister   . Hypertension Sister   . Aneurysm Sister   . Hypertension Brother   . Arthritis Brother   . Hypertension Son   . Colon cancer Neg Hx     CURRENT MEDICATIONS:  Current Outpatient Medications  Medication Sig Dispense Refill  . acetaminophen (TYLENOL) 650 MG CR tablet Take 650 mg by mouth every 6 (six) hours.     Marland Kitchen ELIQUIS 5 MG TABS tablet TAKE 1 TABLET TWICE DAILY 180 tablet 2  . ezetimibe (ZETIA) 10 MG tablet TAKE 1 TABLET EVERY DAY 90 tablet 1  . folic acid (FOLVITE) 1 MG tablet Take 1 tablet (1 mg total) by mouth daily. 90 tablet 3  . furosemide (LASIX) 20 MG tablet Take 1 tablet (20 mg total) by mouth daily. 90 tablet 1  . gabapentin (NEURONTIN) 100 MG capsule Take one capsule by mouth every morning and take two capsules at bedtime 90 capsule 3  . hydrOXYzine (ATARAX/VISTARIL) 10 MG tablet Take one tablet at bedtime , as needed, for itching 14 tablet 0  . lidocaine (LIDODERM) 5 % Place  3 patches onto the skin daily. Remove & Discard patch within 12 hours or as directed by MD 90 patch 3  . magnesium oxide (MAG-OX) 400 (241.3 Mg) MG tablet Take 1 tablet (400 mg total) by mouth 2 (two) times daily. 180 tablet 1  . methocarbamol (ROBAXIN) 500 MG tablet Take 1 tablet (500 mg total) by mouth at bedtime as needed for muscle spasms. May cause drowsiness 8 tablet 0  . methotrexate (RHEUMATREX) 2.5 MG tablet Take 4 tablets (10 mg total) by mouth once a week. Caution:Chemotherapy. Protect from light. 16 tablet 5  . NON FORMULARY Fluid restriction of 1.8 liters per day (1756m total) 7-3 = 800 ml 3-11 = 800 ml 11-7 = 175 ml Document totals qshift. Every Shift Day, Evening, Nigh    . NON FORMULARY Diet: _____ Regular, __x____ NAS, _______Consistent Carbohydrate, _______NPO _____Other    . pantoprazole (PROTONIX) 40 MG tablet Take 1 tablet (40 mg total) by mouth daily. 30 tablet 0  . predniSONE (DELTASONE) 5 MG tablet Take 2 tablets (10 mg total) by mouth daily with breakfast. 180 tablet 1  . triamterene-hydrochlorothiazide (MAXZIDE-25) 37.5-25 MG tablet Take 1 tablet by mouth daily. 90 tablet 3   No current facility-administered medications for this visit.    ALLERGIES:  Allergies  Allergen Reactions  . Cellcept [Mycophenolate Mofetil]     Muscle cramps, mouth soreness, difficulty swallowing, bruising  . Statins     Dx of in  . Ciprofloxacin Hcl Other (See Comments)    Chills, sick, could not tolerate it  . Hydrocodone Nausea Only  . Penicillins Rash    PHYSICAL EXAM:  Performance status (ECOG): 2 - Symptomatic, <50% confined to bed  There were no vitals filed for this visit. Wt Readings from Last 3 Encounters:  05/07/20 152 lb (68.9 kg)  03/13/20 152 lb (68.9 kg)  03/08/20 152 lb (68.9 kg)   Physical Exam Neurological:     Mental Status: She is alert and oriented to person, place, and time.     LABORATORY DATA:  I have reviewed the labs as listed.  CBC  Latest Ref Rng & Units 05/30/2020 03/13/2020 12/26/2019  WBC 4.0 - 10.5 K/uL 6.4 5.0 6.7  Hemoglobin 12.0 - 15.0 g/dL 13.5 11.9 12.7  Hematocrit 36.0 - 46.0 % 43.0 36.4 39.0  Platelets 150 - 400 K/uL 231 214 203   CMP Latest  Ref Rng & Units 03/13/2020 01/30/2020 12/26/2019  Glucose 65 - 99 mg/dL 103(H) 96 101(H)  BUN 8 - 27 mg/dL 29(H) 29(H) 37(H)  Creatinine 0.57 - 1.00 mg/dL 0.70 0.69 0.75  Sodium 134 - 144 mmol/L 143 141 142  Potassium 3.5 - 5.2 mmol/L 4.5 4.3 5.1  Chloride 96 - 106 mmol/L 103 103 105  CO2 20 - 29 mmol/L 25 27 30   Calcium 8.7 - 10.3 mg/dL 9.8 9.4 9.8  Total Protein 6.0 - 8.5 g/dL 6.2 - -  Total Bilirubin 0.0 - 1.2 mg/dL 0.4 - -  Alkaline Phos 44 - 121 IU/L 54 - -  AST 0 - 40 IU/L 66(H) - -  ALT 0 - 32 IU/L 44(H) - -      Component Value Date/Time   RBC 4.31 05/30/2020 1059   MCV 99.8 05/30/2020 1059   MCV 92 03/13/2020 1200   MCH 31.3 05/30/2020 1059   MCHC 31.4 05/30/2020 1059   RDW 14.9 05/30/2020 1059   RDW 12.0 03/13/2020 1200   LYMPHSABS 1.1 05/30/2020 1059   LYMPHSABS 1.1 03/13/2020 1200   MONOABS 0.6 05/30/2020 1059   EOSABS 0.1 05/30/2020 1059   EOSABS 0.1 03/13/2020 1200   BASOSABS 0.0 05/30/2020 1059   BASOSABS 0.0 03/13/2020 1200    DIAGNOSTIC IMAGING:  I have independently reviewed the scans and discussed with the patient. No results found.   ASSESSMENT:  1. Acute deep vein thrombosis (DVT) of left femoral vein ([facility]) Weakly provoked left leg DVT: -Status post fall prior to presentation to the ER on 02/05/2019 with left leg swelling. Doppler showed occlusive DVT extending from proximal left femoral vein through left popliteal vein. -He was started on Eliquis since then. Eliquis was discontinued around 06/06/2019. D-dimer was 1.42 on same day. -I have recommended restarting Eliquis on 06/13/2019. She has been taking since then. -Ultrasound of the leg on 08/05/2019 showed occlusive DVT involving left popliteal vein with extension to left  posterior tibial vein and one of the paired left peroneal veins similar to 01/2019 examination. -Ultrasound on 08/23/2019 showed no evidence of acute or chronic DVT within the either lower extremity with special attention paid to the left popliteal and tibial veins. There is a 16.6 x 3.9 x 3.5 cm complex fluid collection extending from the left popliteal fossa to the proximal/mid aspect of the left calf, not seen on 08/05/2019. - Discontinued Eliquis anticoagulation with Eliquis on 09/05/2019. - Bilateral lower extremity US on 09/18/2019 negative for DVT.  2. Hemarthrosis involving knee joint, left -S/P aspiration and depo-medrol injection by Dr. Aline Brochure on 09/08/2019 with 40 cc of aspirated blood.  3. Normocytic anemia -Anemia of chronic disease. Renal function is stable without obvious signs of renal dysfunction. Will recheck labs in 4 weeks: CBC diff, CMET, iron/TIBC, ferritin, B12, MMA, and folate.  4. Endometrial adenocarcinoma: -CT scan on 03/06/2019 did not show any evidence of recurrence or metastatic disease.   PLAN:  1. Weakly provoked left leg DVT: -Eliquis was started back based on Doppler report from 11/13/2019. -She is tolerating Eliquis without any major problems. -D-dimer has now normalized and is 0.47. -She had repeat left lower extremity Doppler on 03/08/2020 due to acute pain and history of DVT which was negative. -Continue Eliquis at this time.  2.  Right knee pain: -Continue tramadol 50 mg every 6 hours as needed.  3.Normocytic anemia: -Latest hemoglobin is 13.5. -Continue iron tablet twice weekly.  -She cannot take more than that because of constipation.  4.  Hypomagnesemia: -She will continue magnesium twice daily.  -Last magnesium level was 1.9 on 01/30/2020.  We will repeat at next lab draw.  5.  Postherpetic neuralgia: -Diagnosed with shingles in early February 2022. -Finish course of antivirals and antibiotics. -Developed postherpetic neuralgia in  early March 2022. -She was started on gabapentin and Lidoderm patches. -She has follow-up next week with PCP.   Disposition: -RTC in 4 months with repeat labs (CBC, CMP, ferritin, iron, vitamin D, magnesium and D-dimer) and MD assessment -Continue Eliquis at this time.  I provided 16 minutes of non face-to-face telephone visit time during this encounter, and > 50% was spent counseling as documented under my assessment & plan.  Orders placed this encounter:  No orders of the defined types were placed in this encounter.  Faythe Casa, NP 06/06/2020 1:22 PM  [place] [phone]

## 2020-06-10 NOTE — Progress Notes (Signed)
Subjective:  1. Urge incontinence of urine   2. Lesion of bladder   3. Urinary tract infection without hematuria, site unspecified      [name] [name] returns today for history of UUI.   She had been on myrbetriq but was given a script for Toviaz at her visit on 04/13/18 but she hasn't been on anything lately.   She had a CT AP in 12/20 and there was some asymmetric bladder wall thickening on the left and onto the trigone.  She has had no hematuria.   She has frequency and nocturia 2-3x but voids into her pad.  She has not had UTI's in the last couple of years but her UA today looks infected. .      ROS:  ROS:  A complete review of systems was performed.  All systems are negative except for pertinent findings as noted.   ROS  Allergies  Allergen Reactions  . Cellcept [Mycophenolate Mofetil]     Muscle cramps, mouth soreness, difficulty swallowing, bruising  . Statins     Dx of in  . Ciprofloxacin Hcl Other (See Comments)    Chills, sick, could not tolerate it  . Hydrocodone Nausea Only  . Penicillins Rash    Outpatient Encounter Medications as of 06/13/2020  Medication Sig  . acetaminophen (TYLENOL) 650 MG CR tablet Take 650 mg by mouth every 6 (six) hours.   Marland Kitchen ELIQUIS 5 MG TABS tablet TAKE 1 TABLET TWICE DAILY  . ezetimibe (ZETIA) 10 MG tablet TAKE 1 TABLET EVERY DAY  . folic acid (FOLVITE) 1 MG tablet Take 1 tablet (1 mg total) by mouth daily.  . furosemide (LASIX) 20 MG tablet Take 1 tablet (20 mg total) by mouth daily.  Marland Kitchen gabapentin (NEURONTIN) 100 MG capsule Take one capsule by mouth every morning and take two capsules at bedtime  . hydrOXYzine (ATARAX/VISTARIL) 10 MG tablet Take one tablet at bedtime , as needed, for itching  . lidocaine (LIDODERM) 5 % Place 3 patches onto the skin daily. Remove & Discard patch within 12 hours or as directed by MD  . magnesium oxide (MAG-OX) 400 (241.3 Mg) MG tablet Take 1 tablet (400 mg total) by mouth 2 (two) times daily.  . methocarbamol  (ROBAXIN) 500 MG tablet Take 1 tablet (500 mg total) by mouth at bedtime as needed for muscle spasms. May cause drowsiness  . methotrexate (RHEUMATREX) 2.5 MG tablet Take 4 tablets (10 mg total) by mouth once a week. Caution:Chemotherapy. Protect from light.  . NON FORMULARY Fluid restriction of 1.8 liters per day (1740m total) 7-3 = 800 ml 3-11 = 800 ml 11-7 = 175 ml Document totals qshift. Every Shift Day, Evening, Nigh  . NON FORMULARY Diet: _____ Regular, __x____ NAS, _______Consistent Carbohydrate, _______NPO _____Other  . pantoprazole (PROTONIX) 40 MG tablet Take 1 tablet (40 mg total) by mouth daily.  . predniSONE (DELTASONE) 5 MG tablet Take 2 tablets (10 mg total) by mouth daily with breakfast.  . triamterene-hydrochlorothiazide (MAXZIDE-25) 37.5-25 MG tablet Take 1 tablet by mouth daily.  . [DISCONTINUED] gabapentin (NEURONTIN) 100 MG capsule Take one capsule by mouth every morning and take two capsules at bedtime   No facility-administered encounter medications on file as of 06/13/2020.    Past Medical History:  Diagnosis Date  . Acute deep vein thrombosis (DVT) of left femoral vein ([place] 02/07/2019   Dx 02/05/2019  . Arthritis    spinal stenosis  . Deep vein thrombosis (DVT) of left lower extremity ([place] 08/04/2019  .  Elevated LFTs    secondary to fatty liver, negative work-up in 2011  . Hypercholesterolemia   . Hypertension   . Lumbar spinal stenosis 01/14/2018   L3-4 and L4-5  . Myopathy 02/15/2018  . Osteoarthritis    right knee  . Peripheral neuropathy 07/06/2018  . PONV (postoperative nausea and vomiting)   . Uterine cancer ([place]) 08/2006   grade 1, no recurrence up to 2013    Past Surgical History:  Procedure Laterality Date  . 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 APPENDECTOMY  1973  . BREAST BIOPSY Left 2018   Benign  . CHOLECYSTECTOMY  1973  . COLONOSCOPY    06/21/2007   PXT:GGYIRS rectum/Sigmoid diverticula, diminutive hepatic  flexure polyp s/p bx. Benign.   . COLONOSCOPY N/A 10/17/2012   Dr. Oneida Alar- moderate diverticulosis was noted in the sigmoid colon, moderate sized internal hemorrhoids. next tcs in10 years  . ESOPHAGOGASTRODUODENOSCOPY N/A 03/20/2016   Dr. Oneida Alar, widely patent Schatzki ring, anemia due to ASA induced erosive gastritis, mild duodenitis. gastric bx benign without H.pylori.   Marland Kitchen FOOT SURGERY  2007   Pins in toes on left foot, 5 to 6 yrs ago  . GIVENS CAPSULE STUDY N/A 03/20/2016   Procedure: GIVENS CAPSULE STUDY;  Surgeon: Danie Binder, MD;  [facility];  Service: Endoscopy;  Laterality: N/A;  . MUSCLE BIOPSY Left 06/07/2018   Procedure: LEFT THIGH MUSCLE BIOPSY;  Surgeon: Erroll Luna, MD;  Location: [facility];  Service: General;  Laterality: Left;  . TONSILLECTOMY      Social History   Socioeconomic History  . Marital status: Widowed    Spouse name: Jeneen Rinks   . Number of children: 3  . Years of education: 69  . Highest education level: 12th grade  Occupational History  . Occupation: retired   Tobacco Use  . Smoking status: Never Smoker  . Smokeless tobacco: Never Used  . Tobacco comment: Never smoked  Vaping Use  . Vaping Use: Never used  Substance and Sexual Activity  . Alcohol use: No  . Drug use: No  . Sexual activity: Not Currently    Birth control/protection: Post-menopausal, Surgical  Other Topics Concern  . Not on file  Social History Narrative   Husband Jeneen Rinks recently passed away   Caffeine use: soda daily   Right handed    Social Determinants of Health   Financial Resource Strain: Low Risk   . Difficulty of Paying Living Expenses: Not very hard  Food Insecurity: No Food Insecurity  . Worried About Charity fundraiser in the Last Year: Never true  . Ran Out of Food in the Last Year: Never true  Transportation Needs: No Transportation Needs  . Lack of Transportation (Medical): No  . Lack of Transportation (Non-Medical): No   Physical Activity: Inactive  . Days of Exercise per Week: 0 days  . Minutes of Exercise per Session: 0 min  Stress: Stress Concern Present  . Feeling of Stress : To some extent  Social Connections: Socially Isolated  . Frequency of Communication with Friends and Family: More than three times a week  . Frequency of Social Gatherings with Friends and Family: Once a week  . Attends Religious Services: Never  . Active Member of Clubs or Organizations: No  . Attends Archivist Meetings: Never  . Marital Status: Widowed  Intimate Partner Violence: Not At Risk  . Fear of Current or Ex-Partner: No  . Emotionally Abused:  No  . Physically Abused: No  . Sexually Abused: No    Family History  Problem Relation Age of Onset  . Heart disease Father   . Bladder Cancer Brother        in remission   . Hypertension Sister   . Dementia Sister   . Diabetes type II Sister   . Hypertension Sister   . Aneurysm Sister   . Hypertension Brother   . Arthritis Brother   . Hypertension Son   . Colon cancer Neg Hx        Objective: Vitals:   06/13/20 1119  BP: 114/70  Pulse: 97  Temp: 99 F (37.2 C)     Physical Exam  Lab Results:  Results for orders placed or performed in visit on 06/13/20 (from the past 24 hour(s))  Urinalysis, Routine w reflex microscopic     Status: Abnormal   Collection Time: 06/13/20 11:14 AM  Result Value Ref Range   Specific Gravity, UA 1.020 1.005 - 1.030   pH, UA 6.5 5.0 - 7.5   Color, UA Amber (A) Yellow   Appearance Ur Cloudy (A) Clear   Leukocytes,UA 2+ (A) Negative   Protein,UA Negative Negative/Trace   Glucose, UA Negative Negative   Ketones, UA Negative Negative   RBC, UA Negative Negative   Bilirubin, UA Negative Negative   Urobilinogen, Ur 0.2 0.2 - 1.0 mg/dL   Nitrite, UA Positive (A) Negative   Microscopic Examination See below:    Narrative   Performed at:  [facility] [address], [place], [place]   [id number] Lab Director: Mina Marble MT, Phone:  [phone]  Microscopic Examination     Status: Abnormal   Collection Time: 06/13/20 11:14 AM   Urine  Result Value Ref Range   WBC, UA 11-30 (A) 0 - 5 /hpf   RBC None seen 0 - 2 /hpf   Epithelial Cells (non renal) >10 (A) 0 - 10 /hpf   Renal Epithel, UA None seen None seen /hpf   Bacteria, UA Many (A) None seen/Few   Narrative   Performed at:  [facility] [address], [place], [place]  [id number] Lab Director: Mina Marble MT, Phone:  [phone]       Studies/Results:  CLINICAL DATA:  History of uterine cancer status post hysterectomy. Recent occlusive left lower extremity DVT.  EXAM: CT ABDOMEN AND PELVIS WITH CONTRAST  TECHNIQUE: Multidetector CT imaging of the abdomen and pelvis was performed using the standard protocol following bolus administration of intravenous contrast.  CONTRAST:  193m OMNIPAQUE IOHEXOL 300 MG/ML  SOLN  COMPARISON:  01/13/2011  FINDINGS: Lower chest: Scarring/atelectasis in the medial left upper lobe.  Hepatobiliary: 13 mm probable cyst in the posterior right hepatic dome.  Status post cholecystectomy. No intrahepatic or extrahepatic ductal dilatation.  Pancreas: Within normal limits.  Spleen: Within normal limits.  Adrenals/Urinary Tract: Adrenal glands are within normal limits.  Subcentimeter bilateral renal cysts.  No hydronephrosis.  Bladder is notable for mild mucosal hyperenhancement (series 3/image 83).  Stomach/Bowel: Stomach is within normal limits.  No evidence of bowel obstruction.  Appendix is not discretely visualized.  Left colonic diverticulosis, without evidence of diverticulitis.  Vascular/Lymphatic: No evidence of abdominal aortic aneurysm.  Atherosclerotic calcifications of the abdominal aorta and branch vessels.  No suspicious abdominopelvic lymphadenopathy.  Reproductive: Status post hysterectomy.  No  adnexal masses.  Other: No abdominopelvic ascites.  Musculoskeletal: Mild degenerative changes of the lower thoracic spine.  IMPRESSION:  Status post hysterectomy. No findings suspicious for recurrent or metastatic disease.  Mild mucosal enhancement along the left posterolateral bladder wall. Consider cystoscopy for further evaluation, as clinically warranted.  Additional ancillary findings as above.   Electronically Signed   By: Julian Hy M.D.   On: 03/06/2019 18:16    Assessment & Plan: Urge incontinence.  She has a probable UTI, so I will get a culture and treat and then consider OAB therapy at f/u.     Bladder wall lesion.  I will have her return for cystoscopy to assess the CT findings from 12/20.   No orders of the defined types were placed in this encounter.    Orders Placed This Encounter  Procedures  . Microscopic Examination  . Urine Culture  . Urinalysis, Routine w reflex microscopic  . In and Out Cath  . BLADDER SCAN AMB NON-IMAGING      Return for Next available for cystoscopy.   CC: Fayrene Helper, MD      Irine Seal 06/13/2020

## 2020-06-11 ENCOUNTER — Telehealth: Payer: Self-pay

## 2020-06-11 NOTE — Telephone Encounter (Signed)
the script for gabapentin that was sent to [facility] in [place] on 05/28/20 the patient didnt pick up bc of cost reasons and the way it was written so close to the one patel wrote for her she wants to know if this can be sent to [facility] ph# [phone]

## 2020-06-12 ENCOUNTER — Other Ambulatory Visit: Payer: Self-pay

## 2020-06-12 MED ORDER — GABAPENTIN 100 MG PO CAPS: ORAL_CAPSULE | ORAL | 1 refills | Status: DC

## 2020-06-12 NOTE — Telephone Encounter (Signed)
Sent to [facility]

## 2020-06-13 ENCOUNTER — Ambulatory Visit (INDEPENDENT_AMBULATORY_CARE_PROVIDER_SITE_OTHER): Payer: Medicare HMO | Admitting: Urology

## 2020-06-13 ENCOUNTER — Encounter: Payer: Self-pay | Admitting: Urology

## 2020-06-13 ENCOUNTER — Other Ambulatory Visit: Payer: Self-pay

## 2020-06-13 VITALS — BP 114/70 | HR 97 | Temp 99.0°F

## 2020-06-13 DIAGNOSIS — N39498 Other specified urinary incontinence: Secondary | ICD-10-CM

## 2020-06-13 DIAGNOSIS — N329 Bladder disorder, unspecified: Secondary | ICD-10-CM

## 2020-06-13 DIAGNOSIS — N3941 Urge incontinence: Secondary | ICD-10-CM | POA: Diagnosis not present

## 2020-06-13 DIAGNOSIS — N390 Urinary tract infection, site not specified: Secondary | ICD-10-CM

## 2020-06-13 LAB — URINALYSIS, ROUTINE W REFLEX MICROSCOPIC
Bilirubin, UA: NEGATIVE
Glucose, UA: NEGATIVE
Ketones, UA: NEGATIVE
Nitrite, UA: POSITIVE — AB
Protein,UA: NEGATIVE
RBC, UA: NEGATIVE
Specific Gravity, UA: 1.02 (ref 1.005–1.030)
Urobilinogen, Ur: 0.2 mg/dL (ref 0.2–1.0)
pH, UA: 6.5 (ref 5.0–7.5)

## 2020-06-13 LAB — MICROSCOPIC EXAMINATION
Epithelial Cells (non renal): >10 /hpf — AB (ref 0–10)
RBC, Urine: NONE SEEN /hpf (ref 0–2)
Renal Epithel, UA: NONE SEEN /hpf

## 2020-06-13 LAB — IN AND OUT CATH: Result: 80

## 2020-06-13 NOTE — Progress Notes (Signed)
Urological Symptom Review  PVR 42  Pt is prepped for and in and out catherization. Patient was cleaned and prepped in a sterle fashion with betadine. A 14 fr catheter foley was inserted. Urine return was note 80 ml.  Performed by Estill Bamberg RN  Urine sent UA/UC  Patient is experiencing the following symptoms: Frequent urination Hard to postpone urination Leakage of urine   Review of Systems  Gastrointestinal (upper)  : Nausea Vomiting  Gastrointestinal (lower) : Negative for lower GI symptoms  Constitutional : Negative for symptoms  Skin: Negative for skin symptoms  Eyes: Negative for eye symptoms  Ear/Nose/Throat : Negative for Ear/Nose/Throat symptoms  Hematologic/Lymphatic: Easy bruising  Cardiovascular : Leg swelling  Respiratory : Negative for respiratory symptoms  Endocrine: Negative for endocrine symptoms  Musculoskeletal: Joint pain  Neurological: Negative for neurological symptoms  Psychologic: Negative for psychiatric symptoms

## 2020-06-17 ENCOUNTER — Ambulatory Visit: Payer: Medicare HMO | Admitting: Neurology

## 2020-06-17 ENCOUNTER — Encounter: Payer: Self-pay | Admitting: Neurology

## 2020-06-17 ENCOUNTER — Other Ambulatory Visit: Payer: Self-pay

## 2020-06-17 VITALS — BP 119/73 | HR 80

## 2020-06-17 DIAGNOSIS — G7249 Other inflammatory and immune myopathies, not elsewhere classified: Secondary | ICD-10-CM

## 2020-06-17 DIAGNOSIS — Z5181 Encounter for therapeutic drug level monitoring: Secondary | ICD-10-CM | POA: Diagnosis not present

## 2020-06-17 DIAGNOSIS — N390 Urinary tract infection, site not specified: Secondary | ICD-10-CM

## 2020-06-17 LAB — URINE CULTURE

## 2020-06-17 MED ORDER — SULFAMETHOXAZOLE-TRIMETHOPRIM 800-160 MG PO TABS
1.0000 | ORAL_TABLET | Freq: Two times a day (BID) | ORAL | 0 refills | Status: DC
Start: 2020-06-17 — End: 2020-06-18

## 2020-06-17 MED ORDER — SULFAMETHOXAZOLE-TRIMETHOPRIM 800-160 MG PO TABS: 1.0000 | ORAL_TABLET | Freq: Two times a day (BID) | ORAL | 0 refills | Status: DC

## 2020-06-17 NOTE — Progress Notes (Signed)
Reason for visit: Inflammatory myopathy  [name] [name] is an 87 y.o. female  History of present illness:  [name] [name] is an 87 year old right-handed black female with a history of an inflammatory myopathy.  She was lost to follow-up after her October 2020 visit, she was not seen until December 2021, and had gone off of her methotrexate sometime in that interval.  She has had worsening weakness of the arms and legs, she is not effectively ambulatory at this point.  She has finished physical therapy, but she is still unable to walk effectively.  She lives alone, she does have a caretaker coming in the morning up to 12 noon.  She manages on her own fairly well she indicates.  She does have some discomfort in the shoulders but still has a lot of weakness with elevating her arms.  Her proximal legs were weak, she denies issues with swallowing.  She is on methotrexate 10 mg a week and takes folic acid.  She returns for further evaluation.  Past Medical History:  Diagnosis Date  . Acute deep vein thrombosis (DVT) of left femoral vein ([place]) 02/07/2019   Dx 02/05/2019  . Arthritis    spinal stenosis  . Deep vein thrombosis (DVT) of left lower extremity ([place]) 08/04/2019  . Elevated LFTs    secondary to fatty liver, negative work-up in 2011  . Hypercholesterolemia   . Hypertension   . Lumbar spinal stenosis 01/14/2018   L3-4 and L4-5  . Myopathy 02/15/2018  . Osteoarthritis    right knee  . Peripheral neuropathy 07/06/2018  . PONV (postoperative nausea and vomiting)   . Uterine cancer ([place]) 08/2006   grade 1, no recurrence up to 2013    Past Surgical History:  Procedure Laterality Date  . 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 APPENDECTOMY  1973  . BREAST BIOPSY Left 2018   Benign  . CHOLECYSTECTOMY  1973  . COLONOSCOPY    06/21/2007   YWV:PXTGGY rectum/Sigmoid diverticula, diminutive hepatic flexure polyp s/p bx. Benign.   . COLONOSCOPY N/A 10/17/2012   Dr. Oneida Alar-  moderate diverticulosis was noted in the sigmoid colon, moderate sized internal hemorrhoids. next tcs in10 years  . ESOPHAGOGASTRODUODENOSCOPY N/A 03/20/2016   Dr. Oneida Alar, widely patent Schatzki ring, anemia due to ASA induced erosive gastritis, mild duodenitis. gastric bx benign without H.pylori.   Marland Kitchen FOOT SURGERY  2007   Pins in toes on left foot, 5 to 6 yrs ago  . GIVENS CAPSULE STUDY N/A 03/20/2016   Procedure: GIVENS CAPSULE STUDY;  Surgeon: Danie Binder, MD;  [facility];  Service: Endoscopy;  Laterality: N/A;  . MUSCLE BIOPSY Left 06/07/2018   Procedure: LEFT THIGH MUSCLE BIOPSY;  Surgeon: Erroll Luna, MD;  Location: [facility];  Service: General;  Laterality: Left;  . TONSILLECTOMY      Family History  Problem Relation Age of Onset  . Heart disease Father   . Bladder Cancer Brother        in remission   . Hypertension Sister   . Dementia Sister   . Diabetes type II Sister   . Hypertension Sister   . Aneurysm Sister   . Hypertension Brother   . Arthritis Brother   . Hypertension Son   . Colon cancer Neg Hx     Social history:  reports that she has never smoked. She has never used smokeless tobacco. She reports that she does not drink alcohol and  does not use drugs.    Allergies  Allergen Reactions  . Cellcept [Mycophenolate Mofetil]     Muscle cramps, mouth soreness, difficulty swallowing, bruising  . Statins     Dx of in  . Ciprofloxacin Hcl Other (See Comments)    Chills, sick, could not tolerate it  . Hydrocodone Nausea Only  . Penicillins Rash    Medications:  Prior to Admission medications   Medication Sig Start Date End Date Taking? Authorizing Provider  acetaminophen (TYLENOL) 650 MG CR tablet Take 650 mg by mouth every 6 (six) hours.  08/08/19  Yes [provider]  ELIQUIS 5 MG TABS tablet TAKE 1 TABLET TWICE DAILY 12/04/19  Yes Derek Jack, MD  ezetimibe (ZETIA) 10 MG tablet TAKE 1 TABLET EVERY DAY 04/15/20   Yes Perlie Mayo, NP  folic acid (FOLVITE) 1 MG tablet Take 1 tablet (1 mg total) by mouth daily. 03/14/20  Yes Kathrynn Ducking, MD  furosemide (LASIX) 20 MG tablet Take 1 tablet (20 mg total) by mouth daily. 02/19/20  Yes Fayrene Helper, MD  gabapentin (NEURONTIN) 100 MG capsule Take one capsule by mouth every morning and take two capsules at bedtime 06/12/20  Yes Fayrene Helper, MD  lidocaine (LIDODERM) 5 % Place 3 patches onto the skin daily. Remove & Discard patch within 12 hours or as directed by MD 05/28/20  Yes Fayrene Helper, MD  magnesium oxide (MAG-OX) 400 (241.3 Mg) MG tablet Take 1 tablet (400 mg total) by mouth 2 (two) times daily. 12/07/19  Yes Derek Jack, MD  methotrexate (RHEUMATREX) 2.5 MG tablet Take 4 tablets (10 mg total) by mouth once a week. Caution:Chemotherapy. Protect from light. 03/14/20  Yes Kathrynn Ducking, MD  NON FORMULARY Fluid restriction of 1.8 liters per day (1714m total) 7-3 = 800 ml 3-11 = 800 ml 11-7 = 175 ml Document totals qshift. Every Shift Day, Evening, Nigh 08/07/19  Yes [provider]  NON FORMULARY Diet: _____ Regular, __x____ NAS, _______Consistent Carbohydrate, _______NPO _____Other   Yes [provider]  predniSONE (DELTASONE) 5 MG tablet Take 2 tablets (10 mg total) by mouth daily with breakfast. 03/14/20  Yes WKathrynn Ducking MD  triamterene-hydrochlorothiazide (MAXZIDE-25) 37.5-25 MG tablet Take 1 tablet by mouth daily. 11/01/19  Yes SFayrene Helper MD    ROS:  Out of a complete 14 system review of symptoms, the patient complains only of the following symptoms, and all other reviewed systems are negative.  Muscle weakness Walking difficulty Shoulder discomfort  Blood pressure 119/73, pulse 80.  Physical Exam  General: The patient is alert and cooperative at the time of the examination.  Skin: No significant peripheral edema is noted.   Neurologic Exam  Mental status: The  patient is alert and oriented x 3 at the time of the examination. The patient has apparent normal recent and remote memory, with an apparently normal attention span and concentration ability.   Cranial nerves: Facial symmetry is present. Speech is normal, no aphasia or dysarthria is noted. Extraocular movements are full. Visual fields are full.  Motor: The patient has good strength in all 4 extremities, with exception of 3/5 strength with the deltoid muscles bilaterally and with hip flexion bilaterally.  She has good strength with head flexion and extension.  Sensory examination: Soft touch sensation is symmetric on the face, arms, and legs.  Coordination: The patient has good finger-nose-finger and heel-to-shin bilaterally.  Gait and station: The patient is unable to stand on  her own, with assistance, she can stand with maximal assistance but is unable to bear weight independently, she cannot walk.  Reflexes: Deep tendon reflexes are symmetric.   Assessment/Plan:  1.  Inflammatory myopathy  2.  Gait disturbance  I fear that the patient may have sustained permanent muscle injury off of medical therapy.  She is back on methotrexate, we will check blood work today and readjust the dose if needed.  She will follow up here in 3 to 4 months.  Jill Alexanders MD 06/17/2020 11:51 AM  [facility] [address] [place], [place] [address]  Phone [phone] Fax [phone]

## 2020-06-17 NOTE — Progress Notes (Signed)
Patient called and made aware.

## 2020-06-18 ENCOUNTER — Telehealth: Payer: Self-pay | Admitting: Neurology

## 2020-06-18 ENCOUNTER — Other Ambulatory Visit (HOSPITAL_COMMUNITY): Payer: Self-pay | Admitting: Hematology

## 2020-06-18 ENCOUNTER — Telehealth: Payer: Self-pay

## 2020-06-18 ENCOUNTER — Other Ambulatory Visit: Payer: Self-pay

## 2020-06-18 DIAGNOSIS — G7249 Other inflammatory and immune myopathies, not elsewhere classified: Secondary | ICD-10-CM

## 2020-06-18 LAB — COMPREHENSIVE METABOLIC PANEL
ALT: 23 IU/L (ref 0–32)
AST: 24 IU/L (ref 0–40)
Albumin/Globulin Ratio: 2 (ref 1.2–2.2)
Albumin: 4.1 g/dL (ref 3.6–4.6)
Alkaline Phosphatase: 50 IU/L (ref 44–121)
BUN/Creatinine Ratio: 39 — ABNORMAL HIGH (ref 12–28)
BUN: 30 mg/dL — ABNORMAL HIGH (ref 8–27)
Bilirubin Total: 0.5 mg/dL (ref 0.0–1.2)
CO2: 24 mmol/L (ref 20–29)
Calcium: 9.5 mg/dL (ref 8.7–10.3)
Chloride: 103 mmol/L (ref 96–106)
Creatinine, Ser: 0.76 mg/dL (ref 0.57–1.00)
Globulin, Total: 2.1 g/dL (ref 1.5–4.5)
Glucose: 99 mg/dL (ref 65–99)
Potassium: 4 mmol/L (ref 3.5–5.2)
Sodium: 143 mmol/L (ref 134–144)
Total Protein: 6.2 g/dL (ref 6.0–8.5)
eGFR: 76 mL/min/{1.73_m2} (ref >59)

## 2020-06-18 LAB — CBC WITH DIFFERENTIAL/PLATELET
Basophils Absolute: 0 10*3/uL (ref 0.0–0.2)
Basos: 0 %
EOS (ABSOLUTE): 0.1 10*3/uL (ref 0.0–0.4)
Eos: 1 %
Hematocrit: 36.9 % (ref 34.0–46.6)
Hemoglobin: 12.8 g/dL (ref 11.1–15.9)
Immature Grans (Abs): 0 10*3/uL (ref 0.0–0.1)
Immature Granulocytes: 1 %
Lymphocytes Absolute: 0.9 10*3/uL (ref 0.7–3.1)
Lymphs: 10 %
MCH: 31.5 pg (ref 26.6–33.0)
MCHC: 34.7 g/dL (ref 31.5–35.7)
MCV: 91 fL (ref 79–97)
Monocytes Absolute: 0.6 10*3/uL (ref 0.1–0.9)
Monocytes: 6 %
Neutrophils Absolute: 7.2 10*3/uL — ABNORMAL HIGH (ref 1.4–7.0)
Neutrophils: 82 %
Platelets: 206 10*3/uL (ref 150–450)
RBC: 4.06 x10E6/uL (ref 3.77–5.28)
RDW: 13.9 % (ref 11.7–15.4)
WBC: 8.8 10*3/uL (ref 3.4–10.8)

## 2020-06-18 LAB — CK: Total CK: 294 U/L — ABNORMAL HIGH (ref 26–161)

## 2020-06-18 MED ORDER — DOXYCYCLINE HYCLATE 100 MG PO CAPS: 100.0000 mg | ORAL_CAPSULE | Freq: Two times a day (BID) | ORAL | 0 refills | Status: DC

## 2020-06-18 MED ORDER — METHOTREXATE 2.5 MG PO TABS: 12.5000 mg | ORAL_TABLET | ORAL | 5 refills | Status: DC

## 2020-06-18 NOTE — Telephone Encounter (Signed)
Called patient and informed her the Rx was switched to [facility] as she requested. Patient verbalized understanding, appreciation.

## 2020-06-18 NOTE — Telephone Encounter (Signed)
We could give her doxycycline 177m po bid #14 instead.

## 2020-06-18 NOTE — Telephone Encounter (Signed)
Pt called wanting to know if her methotrexate (RHEUMATREX) 2.5 MG tablet can be called in to [facility] instead of the [facility] in [place].

## 2020-06-18 NOTE — Telephone Encounter (Signed)
Pt.notified

## 2020-06-18 NOTE — Telephone Encounter (Signed)
Prescription sent

## 2020-06-18 NOTE — Telephone Encounter (Signed)
I called and talk with the patient.  The blood work looks fairly good with a relatively normal CBC and differential and comprehensive metabolic profile.  The CK enzyme level has dropped significantly, but is still slightly elevated at 294.  I will go up slightly on the methotrexate dose to 12.5 mg once a week.  A prescription will be sent in for the increase in dose.

## 2020-06-26 ENCOUNTER — Other Ambulatory Visit: Payer: Self-pay | Admitting: *Deleted

## 2020-06-26 MED ORDER — PREDNISONE 5 MG PO TABS: 10.0000 mg | ORAL_TABLET | Freq: Every day | ORAL | 1 refills | Status: DC

## 2020-07-03 ENCOUNTER — Other Ambulatory Visit: Payer: Self-pay

## 2020-07-03 ENCOUNTER — Ambulatory Visit (INDEPENDENT_AMBULATORY_CARE_PROVIDER_SITE_OTHER): Payer: Medicare HMO | Admitting: Family Medicine

## 2020-07-03 ENCOUNTER — Encounter: Payer: Self-pay | Admitting: Family Medicine

## 2020-07-03 VITALS — BP 126/72 | HR 97 | Resp 16 | Ht 65.0 in

## 2020-07-03 DIAGNOSIS — I10 Essential (primary) hypertension: Secondary | ICD-10-CM

## 2020-07-03 DIAGNOSIS — B0229 Other postherpetic nervous system involvement: Secondary | ICD-10-CM | POA: Diagnosis not present

## 2020-07-03 DIAGNOSIS — Z9181 History of falling: Secondary | ICD-10-CM

## 2020-07-03 DIAGNOSIS — R600 Localized edema: Secondary | ICD-10-CM | POA: Diagnosis not present

## 2020-07-03 DIAGNOSIS — M5387 Other specified dorsopathies, lumbosacral region: Secondary | ICD-10-CM

## 2020-07-03 DIAGNOSIS — G7249 Other inflammatory and immune myopathies, not elsewhere classified: Secondary | ICD-10-CM

## 2020-07-03 NOTE — Patient Instructions (Signed)
Keep August appointment , call if you need me before  You will have script for wheelchair sent to [facility]  Continue gabapentin and lidoderm  Shingles rash has cleared and the pain will continue to subside   Be careful not to fall  Thanks for choosing [facility], we consider it a privelige to serve you.

## 2020-07-03 NOTE — Progress Notes (Signed)
   [name] [name]     MRN: [id number]      DOB: 1932/08/28   HPI [name] [name] is here for face to face visit for evaluation for wheelchair and f/u shingles.  Still having significant burning and stinging on right neck and chest where she had shingles, not constant , but debilitating when it occurs . lidoderm patch is not sticking, and gabapentin dose recently increased by Neurology Requests and needs a wheelchair for mobility both  Inside and outside of her home. She has significant low back pain and lower extremity weakness which makes safe mobility challenging, She reports adequate upper extremity strength and mobility to self propel in a standard whelchair   ROS Denies recent fever or chills. Denies sinus pressure, nasal congestion, ear pain or sore throat. Denies chest congestion, productive cough or wheezing. Denies chest pains, palpitations and leg swelling Denies abdominal pain, nausea, vomiting,diarrhea or constipation.   Denies dysuria, frequency, hesitancy or incontinence.  Denies depression, anxiety or insomnia. C/o rash.   PE  BP 126/72   Pulse 97   Resp 16   Ht 5' 5"  (1.651 m)   SpO2 96%   BMI 25.29 kg/m   Patient alert and oriented and in no cardiopulmonary distress.Pt experiencing intermittent sharp stinging pain on right side of neck and chest  HEENT: No facial asymmetry, EOMI,     Neck adequate ROM .  Chest: Clear to auscultation bilaterally.  CVS: S1, S2 no murmurs, no S3.Regular rate.  ABD: Soft non tender.   Ext:one plus  edema  MS: Markedly decreased  ROM lumbar  spine, adequate thoughr educed  in shoulders,  Decreased in hips and knees.  Skin: Intact, hypopigmented  rash noted in area where shingles rash was present   Psych: Good eye contact, normal affect. Memory intact not anxious or depressed appearing.  CNS: CN 2-12 intact, power,  normal throughout.no focal deficits noted.   Assessment & Plan  HZV (herpes zoster virus) post herpetic  neuralgia Ongoing pain, managed primarily by gabapentin, recent dose inc by neurology, lidoderm patch of little benefit reportedly  Inflammatory myopathy Marked lower extremity weakness limiting safe mobility in her home and outside, needs and will benefit from standard lightweight wheelchair  Bilateral lower extremity edema Present but reduced as compared to previous visits  At high risk for falls per [name] [name] fall risk assessment scale Needs wheelchair in the home for safety, adequate upper extremity strength to self propel  Essential hypertension Controlled, no change in medication

## 2020-07-04 ENCOUNTER — Encounter: Payer: Self-pay | Admitting: Family Medicine

## 2020-07-04 ENCOUNTER — Telehealth: Payer: Self-pay

## 2020-07-04 NOTE — Assessment & Plan Note (Signed)
Ongoing pain, managed primarily by gabapentin, recent dose inc by neurology, lidoderm patch of little benefit reportedly

## 2020-07-04 NOTE — Assessment & Plan Note (Signed)
Needs wheelchair in the home for safety, adequate upper extremity strength to self propel

## 2020-07-04 NOTE — Addendum Note (Signed)
Addended by: Eual Fines on: 07/04/2020 03:36 PM   Modules accepted: Orders

## 2020-07-04 NOTE — Assessment & Plan Note (Signed)
Controlled, no change in medication  

## 2020-07-04 NOTE — Assessment & Plan Note (Signed)
Marked lower extremity weakness limiting safe mobility in her home and outside, needs and will benefit from standard lightweight wheelchair

## 2020-07-04 NOTE — Assessment & Plan Note (Signed)
Present but reduced as compared to previous visits

## 2020-07-04 NOTE — Telephone Encounter (Signed)
[facility] is not contracted with [employer],

## 2020-07-04 NOTE — Telephone Encounter (Signed)
[name] [name] is sending Rx to [facility] instead.

## 2020-07-08 NOTE — Telephone Encounter (Signed)
[employer] does not accept her insurance so the only other place I know would be [facility] but I'm not even sure if they accept her insurance. She can get someone to come collect the script and they can check around but that's all I know to do

## 2020-07-08 NOTE — Telephone Encounter (Signed)
Patient called does not want the wheelchair from [facility] would have to pay so much more a month and can not afford it .Is there other options, she wants to see it in person before purchasing it.  [facility] said she could not see in person before getting it. Patient # [phone]

## 2020-07-11 ENCOUNTER — Other Ambulatory Visit: Payer: Self-pay

## 2020-07-18 ENCOUNTER — Other Ambulatory Visit (INDEPENDENT_AMBULATORY_CARE_PROVIDER_SITE_OTHER): Payer: Self-pay

## 2020-07-29 ENCOUNTER — Telehealth: Payer: Self-pay

## 2020-07-29 NOTE — Telephone Encounter (Signed)
Physician order-referral form hospice care dropped off by [facility] Consultant), cb# [phone]  fax form to [phone] when completed.  Noted Copied sleeved

## 2020-07-31 ENCOUNTER — Telehealth: Payer: Self-pay | Admitting: Family Medicine

## 2020-07-31 NOTE — Telephone Encounter (Signed)
I called pt to lket her know I will not efer her to hos[pice at thsi time based on her medical conditin, she has no problem with this

## 2020-08-05 ENCOUNTER — Other Ambulatory Visit (HOSPITAL_COMMUNITY): Payer: Self-pay | Admitting: Hematology

## 2020-08-05 NOTE — Telephone Encounter (Signed)
Forward message to PCP. Did not refill on this patient, She is not a pt of [facility] practice.

## 2020-08-08 ENCOUNTER — Encounter: Payer: Self-pay | Admitting: Urology

## 2020-08-08 ENCOUNTER — Other Ambulatory Visit: Payer: Self-pay

## 2020-08-08 ENCOUNTER — Ambulatory Visit: Payer: Medicare HMO | Admitting: Urology

## 2020-08-08 VITALS — BP 116/72 | HR 91 | Temp 99.4°F

## 2020-08-08 DIAGNOSIS — N390 Urinary tract infection, site not specified: Secondary | ICD-10-CM

## 2020-08-08 DIAGNOSIS — N3941 Urge incontinence: Secondary | ICD-10-CM | POA: Diagnosis not present

## 2020-08-08 DIAGNOSIS — N329 Bladder disorder, unspecified: Secondary | ICD-10-CM | POA: Diagnosis not present

## 2020-08-08 LAB — URINALYSIS, ROUTINE W REFLEX MICROSCOPIC
Bilirubin, UA: NEGATIVE
Glucose, UA: NEGATIVE
Ketones, UA: NEGATIVE
Nitrite, UA: POSITIVE — AB
Protein,UA: NEGATIVE
RBC, UA: NEGATIVE
Specific Gravity, UA: 1.02 (ref 1.005–1.030)
Urobilinogen, Ur: 0.2 mg/dL (ref 0.2–1.0)
pH, UA: 6.5 (ref 5.0–7.5)

## 2020-08-08 LAB — MICROSCOPIC EXAMINATION
Epithelial Cells (non renal): >10 /hpf — AB (ref 0–10)
Renal Epithel, UA: NONE SEEN /hpf

## 2020-08-08 NOTE — Progress Notes (Signed)
Subjective:  1. Urinary tract infection without hematuria, site unspecified   2. Lesion of bladder   3. Urge incontinence of urine      [name] [name] returns today for history of UUI.   She had e. Coli on culture and was treated with doxycycline on 07/18/20 for a week.  She returns for cystoscopy to assess the bladder findings from a CT [facility] in 12/20 that showed some asymmetric bladder wall thickening on the left and onto the trigone.  She has had no hematuria.   She has frequency and nocturia 2-3x but voids into her pad.   Her cath UA looks infected again today.   ROS:  ROS:  A complete review of systems was performed.  All systems are negative except for pertinent findings as noted.   ROS  Allergies  Allergen Reactions  . Cellcept [Mycophenolate Mofetil]     Muscle cramps, mouth soreness, difficulty swallowing, bruising  . Statins     Dx of in  . Ciprofloxacin Hcl Other (See Comments)    Chills, sick, could not tolerate it  . Hydrocodone Nausea Only  . Penicillins Rash    Outpatient Encounter Medications as of 08/08/2020  Medication Sig Note  . acetaminophen (TYLENOL) 650 MG CR tablet Take 650 mg by mouth every 6 (six) hours.    Marland Kitchen ELIQUIS 5 MG TABS tablet TAKE 1 TABLET TWICE DAILY   . ezetimibe (ZETIA) 10 MG tablet TAKE 1 TABLET EVERY DAY   . folic acid (FOLVITE) 1 MG tablet TAKE 1 TABLET EVERY DAY   . furosemide (LASIX) 20 MG tablet Take 1 tablet (20 mg total) by mouth daily.   Marland Kitchen gabapentin (NEURONTIN) 100 MG capsule Take one capsule by mouth every morning and take two capsules at bedtime 07/03/2020: Two tabs tid   . lidocaine (LIDODERM) 5 % Place 3 patches onto the skin daily. Remove & Discard patch within 12 hours or as directed by MD   . magnesium oxide (MAG-OX) 400 (241.3 Mg) MG tablet TAKE 1 TABLET TWICE DAILY   . methotrexate (RHEUMATREX) 2.5 MG tablet Take 5 tablets (12.5 mg total) by mouth once a week. Caution:Chemotherapy. Protect from light.   . NON FORMULARY Fluid restriction  of 1.8 liters per day (1760m total) 7-3 = 800 ml 3-11 = 800 ml 11-7 = 175 ml Document totals qshift. Every Shift Day, Evening, Nigh   . NON FORMULARY Diet: _____ Regular, __x____ NAS, _______Consistent Carbohydrate, _______NPO _____Other   . predniSONE (DELTASONE) 5 MG tablet Take 2 tablets (10 mg total) by mouth daily with breakfast.   . triamterene-hydrochlorothiazide (MAXZIDE-25) 37.5-25 MG tablet Take 1 tablet by mouth daily.   . [DISCONTINUED] doxycycline (VIBRAMYCIN) 100 MG capsule Take 100 mg by mouth 2 (two) times daily.    No facility-administered encounter medications on file as of 08/08/2020.    Past Medical History:  Diagnosis Date  . Acute deep vein thrombosis (DVT) of left femoral vein ([facility] 02/07/2019   Dx 02/05/2019  . Arthritis    spinal stenosis  . Deep vein thrombosis (DVT) of left lower extremity ([facility] 08/04/2019  . Elevated LFTs    secondary to fatty liver, negative work-up in 2011  . Hypercholesterolemia   . Hypertension   . Lumbar spinal stenosis 01/14/2018   L3-4 and L4-5  . Myopathy 02/15/2018  . Osteoarthritis    right knee  . Peripheral neuropathy 07/06/2018  . PONV (postoperative nausea and vomiting)   . Uterine cancer ([facility] 08/2006   grade 1, no  recurrence up to 2013    Past Surgical History:  Procedure Laterality Date  . 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 APPENDECTOMY  1973  . BREAST BIOPSY Left 2018   Benign  . CHOLECYSTECTOMY  1973  . COLONOSCOPY    06/21/2007   YIR:SWNIOE rectum/Sigmoid diverticula, diminutive hepatic flexure polyp s/p bx. Benign.   . COLONOSCOPY N/A 10/17/2012   Dr. Oneida Alar- moderate diverticulosis was noted in the sigmoid colon, moderate sized internal hemorrhoids. next tcs in10 years  . ESOPHAGOGASTRODUODENOSCOPY N/A 03/20/2016   Dr. Oneida Alar, widely patent Schatzki ring, anemia due to ASA induced erosive gastritis, mild duodenitis. gastric bx benign without H.pylori.   Marland Kitchen FOOT SURGERY  2007    Pins in toes on left foot, 5 to 6 yrs ago  . GIVENS CAPSULE STUDY N/A 03/20/2016   Procedure: GIVENS CAPSULE STUDY;  Surgeon: Danie Binder, MD;  [facility];  Service: Endoscopy;  Laterality: N/A;  . MUSCLE BIOPSY Left 06/07/2018   Procedure: LEFT THIGH MUSCLE BIOPSY;  Surgeon: Erroll Luna, MD;  Location: [facility];  Service: General;  Laterality: Left;  . TONSILLECTOMY      Social History   Socioeconomic History  . Marital status: Widowed    Spouse name: Jeneen Rinks   . Number of children: 3  . Years of education: 1  . Highest education level: 12th grade  Occupational History  . Occupation: retired   Tobacco Use  . Smoking status: Never Smoker  . Smokeless tobacco: Never Used  . Tobacco comment: Never smoked  Vaping Use  . Vaping Use: Never used  Substance and Sexual Activity  . Alcohol use: No  . Drug use: No  . Sexual activity: Not Currently    Birth control/protection: Post-menopausal, Surgical  Other Topics Concern  . Not on file  Social History Narrative   Husband Jeneen Rinks recently passed away   Caffeine use: soda daily   Right handed    Social Determinants of Health   Financial Resource Strain: Low Risk   . Difficulty of Paying Living Expenses: Not very hard  Food Insecurity: No Food Insecurity  . Worried About Charity fundraiser in the Last Year: Never true  . Ran Out of Food in the Last Year: Never true  Transportation Needs: No Transportation Needs  . Lack of Transportation (Medical): No  . Lack of Transportation (Non-Medical): No  Physical Activity: Inactive  . Days of Exercise per Week: 0 days  . Minutes of Exercise per Session: 0 min  Stress: Stress Concern Present  . Feeling of Stress : To some extent  Social Connections: Socially Isolated  . Frequency of Communication with Friends and Family: More than three times a week  . Frequency of Social Gatherings with Friends and Family: Once a week  . Attends Religious Services:  Never  . Active Member of Clubs or Organizations: No  . Attends Archivist Meetings: Never  . Marital Status: Widowed  Intimate Partner Violence: Not At Risk  . Fear of Current or Ex-Partner: No  . Emotionally Abused: No  . Physically Abused: No  . Sexually Abused: No    Family History  Problem Relation Age of Onset  . Heart disease Father   . Bladder Cancer Brother        in remission   . Hypertension Sister   . Dementia Sister   . Diabetes type II Sister   . Hypertension Sister   . Aneurysm  Sister   . Hypertension Brother   . Arthritis Brother   . Hypertension Son   . Colon cancer Neg Hx        Objective: Vitals:   08/08/20 0944  BP: 116/72  Pulse: 91  Temp: 99.4 F (37.4 C)     Physical Exam  Lab Results:  Results for orders placed or performed in visit on 08/08/20 (from the past 24 hour(s))  Urinalysis, Routine w reflex microscopic     Status: Abnormal   Collection Time: 08/08/20  1:34 PM  Result Value Ref Range   Specific Gravity, UA 1.020 1.005 - 1.030   pH, UA 6.5 5.0 - 7.5   Color, UA Yellow Yellow   Appearance Ur Cloudy (A) Clear   Leukocytes,UA Trace (A) Negative   Protein,UA Negative Negative/Trace   Glucose, UA Negative Negative   Ketones, UA Negative Negative   RBC, UA Negative Negative   Bilirubin, UA Negative Negative   Urobilinogen, Ur 0.2 0.2 - 1.0 mg/dL   Nitrite, UA Positive (A) Negative   Microscopic Examination See below:    Narrative   Performed at:  [facility] [address], [place], [place]  [id number] Lab Director: Mina Marble MT, Phone:  [phone]  Microscopic Examination     Status: Abnormal   Collection Time: 08/08/20  1:34 PM   Urine  Result Value Ref Range   WBC, UA 6-10 (A) 0 - 5 /hpf   RBC 0-2 0 - 2 /hpf   Epithelial Cells (non renal) >10 (A) 0 - 10 /hpf   Renal Epithel, UA None seen None seen /hpf   Bacteria, UA Many (A) None seen/Few   Narrative   Performed at:  [facility] [address] [place], [place]  [id number] Lab Director: Mina Marble MT, Phone:  [phone]   Recent Results (from the past 2160 hour(s))  CBC with Differential/Platelet     Status: None   Collection Time: 05/30/20 10:59 AM  Result Value Ref Range   WBC 6.4 4.0 - 10.5 K/uL   RBC 4.31 3.87 - 5.11 MIL/uL   Hemoglobin 13.5 12.0 - 15.0 g/dL   HCT 43.0 36.0 - 46.0 %   MCV 99.8 80.0 - 100.0 fL   MCH 31.3 26.0 - 34.0 pg   MCHC 31.4 30.0 - 36.0 g/dL   RDW 14.9 11.5 - 15.5 %   Platelets 231 150 - 400 K/uL   nRBC 0.0 0.0 - 0.2 %   Neutrophils Relative % 71 %   Neutro Abs 4.5 1.7 - 7.7 K/uL   Lymphocytes Relative 16 %   Lymphs Abs 1.1 0.7 - 4.0 K/uL   Monocytes Relative 9 %   Monocytes Absolute 0.6 0.1 - 1.0 K/uL   Eosinophils Relative 2 %   Eosinophils Absolute 0.1 0.0 - 0.5 K/uL   Basophils Relative 1 %   Basophils Absolute 0.0 0.0 - 0.1 K/uL   Immature Granulocytes 1 %   Abs Immature Granulocytes 0.03 0.00 - 0.07 K/uL    Comment: Performed at [facility], [address] [place], [place] [address]  Ferritin     Status: None   Collection Time: 05/30/20 10:59 AM  Result Value Ref Range   Ferritin 120 11 - 307 ng/mL    Comment: Performed at [facility], [address], [place], [place] [address]  Iron and TIBC     Status: Abnormal   Collection Time: 05/30/20 10:59 AM  Result Value Ref Range  Iron 121 28 - 170 ug/dL   TIBC 353 250 - 450 ug/dL   Saturation Ratios 34 (H) 10.4 - 31.8 %   UIBC 232 ug/dL    Comment: Performed at [facility], [address] [place], [place] [address]  Folate     Status: None   Collection Time: 05/30/20 10:59 AM  Result Value Ref Range   Folate 23.0 >5.9 ng/mL    Comment: RESULTS CONFIRMED BY MANUAL DILUTION Performed at [facility], [address], [place], [place] [address]   D-dimer, quantitative     Status: None   Collection Time: 05/30/20 10:59 AM  Result Value Ref Range   D-Dimer, Quant 0.47 0.00 - 0.50 ug/mL-FEU     Comment: (NOTE) At the manufacturer cut-off value of 0.5 g/mL FEU, this assay has a negative predictive value of 95-100%.This assay is intended for use in conjunction with a clinical pretest probability (PTP) assessment model to exclude pulmonary embolism (PE) and deep venous thrombosis (DVT) in outpatients suspected of PE or DVT. Results should be correlated with clinical presentation. Performed at [facility], [address], [place], [place] [address]   TSH     Status: None   Collection Time: 05/30/20 10:59 AM  Result Value Ref Range   TSH 2.489 0.350 - 4.500 uIU/mL    Comment: Performed by a 3rd Generation assay with a functional sensitivity of <=0.01 uIU/mL. Performed at [facility], [address], [place], [place] [address]   Lipid panel     Status: Abnormal   Collection Time: 05/30/20 10:59 AM  Result Value Ref Range   Cholesterol 235 (H) 0 - 200 mg/dL   Triglycerides 92 <150 mg/dL   HDL 110 >40 mg/dL   Total CHOL/HDL Ratio 2.1 RATIO   VLDL 18 0 - 40 mg/dL   LDL Cholesterol 107 (H) 0 - 99 mg/dL    Comment:        Total Cholesterol/HDL:CHD Risk Coronary Heart Disease Risk Table                     Men   Women  1/2 Average Risk   3.4   3.3  Average Risk       5.0   4.4  2 X Average Risk   9.6   7.1  3 X Average Risk  23.4   11.0        Use the calculated Patient Ratio above and the CHD Risk Table to determine the patient's CHD Risk.        ATP III CLASSIFICATION (LDL):  <100     mg/dL   Optimal  100-129  mg/dL   Near or Above                    Optimal  130-159  mg/dL   Borderline  160-189  mg/dL   High  >190     mg/dL   Very High Performed at [facility] [place], [place] [address]   VITAMIN D 25 Hydroxy (Vit-D Deficiency, Fractures)     Status: None   Collection Time: 05/30/20 10:59 AM  Result Value Ref Range   Vit D, 25-Hydroxy 43.23 30 - 100 ng/mL    Comment: (NOTE) Vitamin D deficiency has been defined by the Institute of Medicine  and  an Endocrine Society practice guideline as a level of serum 25-OH  vitamin D less than 20 ng/mL (1,2). The Endocrine Society went on to  further  define vitamin D insufficiency as a level between 21 and 29  ng/mL (2).  1. IOM (Institute of Medicine). 2010. Dietary reference intakes for  calcium and D. [place]: The [employer]. 2. Holick MF, Binkley [place], Bischoff-Ferrari HA, et al. Evaluation,  treatment, and prevention of vitamin D deficiency: an Endocrine  Society clinical practice guideline, JCEM. 2011 Jul; 96(7): 1911-30.  Performed at [facility], [place] [address], [name], [place] [address]   Urinalysis, Routine w reflex microscopic     Status: Abnormal   Collection Time: 06/13/20 11:14 AM  Result Value Ref Range   Specific Gravity, UA 1.020 1.005 - 1.030   pH, UA 6.5 5.0 - 7.5   Color, UA Amber (A) Yellow   Appearance Ur Cloudy (A) Clear   Leukocytes,UA 2+ (A) Negative   Protein,UA Negative Negative/Trace   Glucose, UA Negative Negative   Ketones, UA Negative Negative   RBC, UA Negative Negative   Bilirubin, UA Negative Negative   Urobilinogen, Ur 0.2 0.2 - 1.0 mg/dL   Nitrite, UA Positive (A) Negative   Microscopic Examination See below:   Microscopic Examination     Status: Abnormal   Collection Time: 06/13/20 11:14 AM   Urine  Result Value Ref Range   WBC, UA 11-30 (A) 0 - 5 /hpf   RBC None seen 0 - 2 /hpf   Epithelial Cells (non renal) >10 (A) 0 - 10 /hpf   Renal Epithel, UA None seen None seen /hpf   Bacteria, UA Many (A) None seen/Few  In and Out Cath     Status: None   Collection Time: 06/13/20 12:04 PM  Result Value Ref Range   Result 80   Urine Culture     Status: Abnormal   Collection Time: 06/13/20 12:08 PM   Specimen: Urine, Catheterized   Urine  Result Value Ref Range   Urine Culture, Routine Final report (A)    Organism ID, Bacteria Escherichia coli (A)     Comment: Cefazolin <=4 ug/mL Cefazolin with an MIC <=16 predicts  susceptibility to the oral agents cefaclor, cefdinir, cefpodoxime, cefprozil, cefuroxime, cephalexin, and loracarbef when used for therapy of uncomplicated urinary tract infections due to E. coli, Klebsiella pneumoniae, and Proteus mirabilis. Greater than 100,000 colony forming units per mL    Antimicrobial Susceptibility Comment     Comment:       ** S = Susceptible; I = Intermediate; R = Resistant **                    P = Positive; N = Negative             MICS are expressed in micrograms per mL    Antibiotic                 RSLT#1    RSLT#2    RSLT#3    RSLT#4 Amoxicillin/Clavulanic Acid    S Ampicillin                     S Cefepime                       S Ceftriaxone                    S Cefuroxime                     S Ciprofloxacin  S Ertapenem                      S Gentamicin                     S Imipenem                       S Levofloxacin                   S Meropenem                      S Nitrofurantoin                 S Piperacillin/Tazobactam        S Tetracycline                   S Tobramycin                     S Trimethoprim/Sulfa             S   Comprehensive metabolic panel     Status: Abnormal   Collection Time: 06/17/20 12:14 PM  Result Value Ref Range   Glucose 99 65 - 99 mg/dL   BUN 30 (H) 8 - 27 mg/dL   Creatinine, Ser 0.76 0.57 - 1.00 mg/dL   eGFR 76 >59 mL/min/1.73   BUN/Creatinine Ratio 39 (H) 12 - 28   Sodium 143 134 - 144 mmol/L   Potassium 4.0 3.5 - 5.2 mmol/L   Chloride 103 96 - 106 mmol/L   CO2 24 20 - 29 mmol/L   Calcium 9.5 8.7 - 10.3 mg/dL   Total Protein 6.2 6.0 - 8.5 g/dL   Albumin 4.1 3.6 - 4.6 g/dL   Globulin, Total 2.1 1.5 - 4.5 g/dL   Albumin/Globulin Ratio 2.0 1.2 - 2.2   Bilirubin Total 0.5 0.0 - 1.2 mg/dL   Alkaline Phosphatase 50 44 - 121 IU/L   AST 24 0 - 40 IU/L   ALT 23 0 - 32 IU/L  CBC with Differential/Platelet     Status: Abnormal   Collection Time: 06/17/20 12:14 PM  Result Value Ref Range    WBC 8.8 3.4 - 10.8 x10E3/uL   RBC 4.06 3.77 - 5.28 x10E6/uL   Hemoglobin 12.8 11.1 - 15.9 g/dL   Hematocrit 36.9 34.0 - 46.6 %   MCV 91 79 - 97 fL   MCH 31.5 26.6 - 33.0 pg   MCHC 34.7 31.5 - 35.7 g/dL   RDW 13.9 11.7 - 15.4 %   Platelets 206 150 - 450 x10E3/uL   Neutrophils 82 Not Estab. %   Lymphs 10 Not Estab. %   Monocytes 6 Not Estab. %   Eos 1 Not Estab. %   Basos 0 Not Estab. %   Neutrophils Absolute 7.2 (H) 1.4 - 7.0 x10E3/uL   Lymphocytes Absolute 0.9 0.7 - 3.1 x10E3/uL   Monocytes Absolute 0.6 0.1 - 0.9 x10E3/uL   EOS (ABSOLUTE) 0.1 0.0 - 0.4 x10E3/uL   Basophils Absolute 0.0 0.0 - 0.2 x10E3/uL   Immature Granulocytes 1 Not Estab. %   Immature Grans (Abs) 0.0 0.0 - 0.1 x10E3/uL  CK     Status: Abnormal   Collection Time: 06/17/20 12:14 PM  Result Value Ref Range   Total CK 294 (H) 26 - 161 U/L  Urinalysis, Routine w reflex microscopic  Status: Abnormal   Collection Time: 08/08/20  1:34 PM  Result Value Ref Range   Specific Gravity, UA 1.020 1.005 - 1.030   pH, UA 6.5 5.0 - 7.5   Color, UA Yellow Yellow   Appearance Ur Cloudy (A) Clear   Leukocytes,UA Trace (A) Negative   Protein,UA Negative Negative/Trace   Glucose, UA Negative Negative   Ketones, UA Negative Negative   RBC, UA Negative Negative   Bilirubin, UA Negative Negative   Urobilinogen, Ur 0.2 0.2 - 1.0 mg/dL   Nitrite, UA Positive (A) Negative   Microscopic Examination See below:   Microscopic Examination     Status: Abnormal   Collection Time: 08/08/20  1:34 PM   Urine  Result Value Ref Range   WBC, UA 6-10 (A) 0 - 5 /hpf   RBC 0-2 0 - 2 /hpf   Epithelial Cells (non renal) >10 (A) 0 - 10 /hpf   Renal Epithel, UA None seen None seen /hpf   Bacteria, UA Many (A) None seen/Few       Studies/Results:  CLINICAL DATA:  History of uterine cancer status post hysterectomy. Recent occlusive left lower extremity DVT.  EXAM: CT ABDOMEN AND PELVIS WITH CONTRAST  TECHNIQUE: Multidetector CT  imaging of the abdomen and pelvis was performed using the standard protocol following bolus administration of intravenous contrast.  CONTRAST:  132m OMNIPAQUE IOHEXOL 300 MG/ML  SOLN  COMPARISON:  01/13/2011  FINDINGS: Lower chest: Scarring/atelectasis in the medial left upper lobe.  Hepatobiliary: 13 mm probable cyst in the posterior right hepatic dome.  Status post cholecystectomy. No intrahepatic or extrahepatic ductal dilatation.  Pancreas: Within normal limits.  Spleen: Within normal limits.  Adrenals/Urinary Tract: Adrenal glands are within normal limits.  Subcentimeter bilateral renal cysts.  No hydronephrosis.  Bladder is notable for mild mucosal hyperenhancement (series 3/image 83).  Stomach/Bowel: Stomach is within normal limits.  No evidence of bowel obstruction.  Appendix is not discretely visualized.  Left colonic diverticulosis, without evidence of diverticulitis.  Vascular/Lymphatic: No evidence of abdominal aortic aneurysm.  Atherosclerotic calcifications of the abdominal aorta and branch vessels.  No suspicious abdominopelvic lymphadenopathy.  Reproductive: Status post hysterectomy.  No adnexal masses.  Other: No abdominopelvic ascites.  Musculoskeletal: Mild degenerative changes of the lower thoracic spine.  IMPRESSION: Status post hysterectomy. No findings suspicious for recurrent or metastatic disease.  Mild mucosal enhancement along the left posterolateral bladder wall. Consider cystoscopy for further evaluation, as clinically warranted.  Additional ancillary findings as above.   Electronically Signed   By: SJulian HyM.D.   On: 03/06/2019 18:16    Assessment & Plan: Urge incontinence.  She has a probable UTI again, so I will get a culture and treat and then start suppression until we can get her back in for cystoscopy.  I will consider OAB therapy at f/u.     Bladder wall lesion.  I will  have her return for cystoscopy in 4-6 weeks pm suppressive antibiotics to assess the CT findings from 12/20.   No orders of the defined types were placed in this encounter.    Orders Placed This Encounter  Procedures  . Urine Culture  . Microscopic Examination  . Urinalysis, Routine w reflex microscopic      Return in about 4 weeks (around 09/05/2020) for 4-6 weeks for cystoscopy.   CC: SFayrene Helper MD      JIrine Seal5/19/2022 Patient ID: EQuincy [name] female   DOB: [birth year] 87y.o.   MRN:  [id number]

## 2020-08-08 NOTE — Progress Notes (Signed)
Urological Symptom Review  Patient is experiencing the following symptoms: Frequent urination Hard to postpone urination Leakage of urine   Review of Systems  Gastrointestinal (upper)  : Negative for upper GI symptoms  Gastrointestinal (lower) : Negative for lower GI symptoms  Constitutional : Negative for symptoms  Skin: Negative for skin symptoms  Eyes: Negative for eye symptoms  Ear/Nose/Throat : Negative for Ear/Nose/Throat symptoms  Hematologic/Lymphatic: Easy bruising  Cardiovascular : Leg swelling  Respiratory : Negative for respiratory symptoms  Endocrine: Negative for endocrine symptoms  Musculoskeletal: Joint pain  Neurological: Negative for neurological symptoms  Psychologic: Negative for psychiatric symptoms

## 2020-08-13 ENCOUNTER — Telehealth: Payer: Self-pay

## 2020-08-13 DIAGNOSIS — N390 Urinary tract infection, site not specified: Secondary | ICD-10-CM

## 2020-08-13 LAB — URINE CULTURE

## 2020-08-13 MED ORDER — DOXYCYCLINE HYCLATE 100 MG PO CAPS: ORAL_CAPSULE | ORAL | 2 refills | Status: DC

## 2020-08-13 NOTE — Telephone Encounter (Signed)
-----   Message from Irine Seal, MD sent at 08/13/2020  1:39 PM EDT ----- Doxycycline 137m po bid #30 with 2 refills.   She will need to reduce the dose to nightly after 5 days and continue that with the refills.  She should have f/u for cystoscopy and will need to be on it until then.

## 2020-08-13 NOTE — Telephone Encounter (Signed)
Patient called and made aware. Rx sent in. Instructions went over verbally with patient. Patient voiced understanding.

## 2020-08-26 ENCOUNTER — Other Ambulatory Visit (HOSPITAL_COMMUNITY): Payer: Self-pay | Admitting: Hematology

## 2020-09-16 ENCOUNTER — Other Ambulatory Visit: Payer: Self-pay | Admitting: Family Medicine

## 2020-09-25 ENCOUNTER — Other Ambulatory Visit (HOSPITAL_COMMUNITY): Payer: Self-pay | Admitting: *Deleted

## 2020-09-25 MED ORDER — APIXABAN 5 MG PO TABS: 5.0000 mg | ORAL_TABLET | Freq: Two times a day (BID) | ORAL | 3 refills | Status: DC

## 2020-09-26 ENCOUNTER — Telehealth: Payer: Self-pay

## 2020-09-26 ENCOUNTER — Other Ambulatory Visit: Payer: Medicare HMO | Admitting: Urology

## 2020-09-26 NOTE — Telephone Encounter (Signed)
Returned patient call- patient on nightly suppression abx for recurrent UTI to have cysto.   She was rescheduled to sept- pt will need 1 more month refill of abx

## 2020-09-26 NOTE — Telephone Encounter (Signed)
Patient has been on antibiotic prescribed by Dr. Jeffie Pollock. Wants to know what she is needing to do with taking it since today's appointment was rescheduled.  Please advise.  Call back:  [phone]  Thanks, Helene Kelp

## 2020-09-27 ENCOUNTER — Other Ambulatory Visit (HOSPITAL_COMMUNITY): Payer: Self-pay | Admitting: *Deleted

## 2020-09-27 DIAGNOSIS — I82412 Acute embolism and thrombosis of left femoral vein: Secondary | ICD-10-CM

## 2020-09-27 DIAGNOSIS — D649 Anemia, unspecified: Secondary | ICD-10-CM

## 2020-09-30 ENCOUNTER — Other Ambulatory Visit: Payer: Self-pay

## 2020-09-30 ENCOUNTER — Inpatient Hospital Stay (HOSPITAL_COMMUNITY): Payer: Medicare HMO | Attending: Hematology

## 2020-09-30 DIAGNOSIS — Z86718 Personal history of other venous thrombosis and embolism: Secondary | ICD-10-CM | POA: Diagnosis not present

## 2020-09-30 DIAGNOSIS — N390 Urinary tract infection, site not specified: Secondary | ICD-10-CM

## 2020-09-30 DIAGNOSIS — E559 Vitamin D deficiency, unspecified: Secondary | ICD-10-CM | POA: Diagnosis not present

## 2020-09-30 DIAGNOSIS — Z7901 Long term (current) use of anticoagulants: Secondary | ICD-10-CM | POA: Diagnosis not present

## 2020-09-30 DIAGNOSIS — D649 Anemia, unspecified: Secondary | ICD-10-CM

## 2020-09-30 DIAGNOSIS — Z862 Personal history of diseases of the blood and blood-forming organs and certain disorders involving the immune mechanism: Secondary | ICD-10-CM | POA: Diagnosis not present

## 2020-09-30 DIAGNOSIS — I82412 Acute embolism and thrombosis of left femoral vein: Secondary | ICD-10-CM

## 2020-09-30 LAB — IRON AND TIBC
Iron: 71 ug/dL (ref 28–170)
Saturation Ratios: 24 % (ref 10.4–31.8)
TIBC: 298 ug/dL (ref 250–450)
UIBC: 227 ug/dL

## 2020-09-30 LAB — COMPREHENSIVE METABOLIC PANEL
ALT: 22 U/L (ref 0–44)
AST: 28 U/L (ref 15–41)
Albumin: 3.4 g/dL — ABNORMAL LOW (ref 3.5–5.0)
Alkaline Phosphatase: 40 U/L (ref 38–126)
Anion gap: 8 (ref 5–15)
BUN: 27 mg/dL — ABNORMAL HIGH (ref 8–23)
CO2: 29 mmol/L (ref 22–32)
Calcium: 8.8 mg/dL — ABNORMAL LOW (ref 8.9–10.3)
Chloride: 102 mmol/L (ref 98–111)
Creatinine, Ser: 0.82 mg/dL (ref 0.44–1.00)
GFR, Estimated: >60 mL/min (ref >60)
Glucose, Bld: 106 mg/dL — ABNORMAL HIGH (ref 70–99)
Potassium: 3.7 mmol/L (ref 3.5–5.1)
Sodium: 139 mmol/L (ref 135–145)
Total Bilirubin: 0.8 mg/dL (ref 0.3–1.2)
Total Protein: 6.2 g/dL — ABNORMAL LOW (ref 6.5–8.1)

## 2020-09-30 LAB — CBC WITH DIFFERENTIAL/PLATELET
Abs Immature Granulocytes: 0.04 10*3/uL (ref 0.00–0.07)
Basophils Absolute: 0 10*3/uL (ref 0.0–0.1)
Basophils Relative: 0 %
Eosinophils Absolute: 0.2 10*3/uL (ref 0.0–0.5)
Eosinophils Relative: 3 %
HCT: 40.4 % (ref 36.0–46.0)
Hemoglobin: 12.9 g/dL (ref 12.0–15.0)
Immature Granulocytes: 1 %
Lymphocytes Relative: 14 %
Lymphs Abs: 1.1 10*3/uL (ref 0.7–4.0)
MCH: 32.1 pg (ref 26.0–34.0)
MCHC: 31.9 g/dL (ref 30.0–36.0)
MCV: 100.5 fL — ABNORMAL HIGH (ref 80.0–100.0)
Monocytes Absolute: 0.5 10*3/uL (ref 0.1–1.0)
Monocytes Relative: 6 %
Neutro Abs: 6.2 10*3/uL (ref 1.7–7.7)
Neutrophils Relative %: 76 %
Platelets: 193 10*3/uL (ref 150–400)
RBC: 4.02 MIL/uL (ref 3.87–5.11)
RDW: 12.3 % (ref 11.5–15.5)
WBC: 8 10*3/uL (ref 4.0–10.5)
nRBC: 0 % (ref 0.0–0.2)

## 2020-09-30 LAB — D-DIMER, QUANTITATIVE: D-Dimer, Quant: 0.41 ug/mL-FEU (ref 0.00–0.50)

## 2020-09-30 LAB — MAGNESIUM: Magnesium: 2 mg/dL (ref 1.7–2.4)

## 2020-09-30 LAB — VITAMIN D 25 HYDROXY (VIT D DEFICIENCY, FRACTURES): Vit D, 25-Hydroxy: 28.69 ng/mL — ABNORMAL LOW (ref 30–100)

## 2020-09-30 LAB — FERRITIN: Ferritin: 138 ng/mL (ref 11–307)

## 2020-09-30 MED ORDER — DOXYCYCLINE HYCLATE 100 MG PO CAPS: ORAL_CAPSULE | ORAL | 1 refills | Status: DC

## 2020-09-30 NOTE — Telephone Encounter (Signed)
Patient called and notified of medication sent to pharmacy. Patient voiced understanding.

## 2020-10-04 NOTE — Progress Notes (Signed)
[place], [place] [address]   CLINIC:  Medical Oncology/Hematology  PCP:  Fayrene Helper, MD [address] [place] [address] [phone]   REASON FOR VISIT:  Follow-up for normocytic anemia & DVT of left femoral vein  PRIOR THERAPY: None  CURRENT THERAPY: Eliquis 5 mg twice daily, iron tablet twice weekly  INTERVAL HISTORY:  [name] 87 y.o. 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  She was last evaluated via telephone visit by NP Faythe Casa on 06/06/2020.  At today's visit, she reports feeling fairly well.  No recent hospitalizations, surgeries, or changes in baseline health status.  Patient is taking Eliquis as prescribed, tolerating well.  She did miss a few doses last week due to a delay in refill, but otherwise has not missed any doses.  She denies any current signs or symptoms of blood clots.  She does have chronic bilateral lower extremity edema, but does not have any unilateral/asymmetrical swelling, erythema, pain.  No chest pain, dyspnea, cough, hemoptysis.  She denies any signs or symptoms of blood loss such as epistaxis, hematemesis, hematochezia, or melena.  Regarding her history of endometrial cancer (2008), she denies any abnormal vaginal bleeding, B symptoms, unintentional weight loss, abdominal pain.  She reports that she no longer takes oral iron supplementation or vitamin D supplementation, but that both of these were stopped by her primary care provider  She has 50% energy and 80% appetite. She endorses that she is maintaining a stable weight.    REVIEW OF SYSTEMS:  Review of Systems  Constitutional:  Positive for fatigue. Negative for appetite change, chills, diaphoresis, fever and unexpected weight change.  HENT:   Negative for lump/mass and nosebleeds.   Eyes:  Negative for eye problems.  Respiratory:  Negative  for cough, hemoptysis and shortness of breath.   Cardiovascular:  Negative for chest pain, leg swelling and palpitations.  Gastrointestinal:  Negative for abdominal pain, blood in stool, constipation, diarrhea, nausea and vomiting.  Genitourinary:  Positive for bladder incontinence. Negative for hematuria and vaginal bleeding.   Skin: Negative.   Neurological:  Positive for numbness. Negative for dizziness, headaches and light-headedness.  Hematological:  Does not bruise/bleed easily.  Psychiatric/Behavioral:  Positive for depression and sleep disturbance. Negative for suicidal ideas. The patient is nervous/anxious.      PAST MEDICAL/SURGICAL HISTORY:  Past Medical History:  Diagnosis Date   Acute deep vein thrombosis (DVT) of left femoral vein ([facility]) 02/07/2019   Dx 02/05/2019   Arthritis    spinal stenosis   Deep vein thrombosis (DVT) of left lower extremity ([facility]) 08/04/2019   Elevated LFTs    secondary to fatty liver, negative work-up in 2011   Hypercholesterolemia    Hypertension    Lumbar spinal stenosis 01/14/2018   L3-4 and L4-5   Myopathy 02/15/2018   Osteoarthritis    right knee   Peripheral neuropathy 07/06/2018   PONV (postoperative nausea and vomiting)    Uterine cancer ([facility]) 08/2006   grade 1, no recurrence up to 2013   Past Surgical History:  Procedure Laterality Date   ABDOMINAL HYSTERECTOMY  2008   adenocarcinoma stage 1   APPENDECTOMY  1973   BREAST BIOPSY Left 2018   Benign   CHOLECYSTECTOMY  1973   COLONOSCOPY    06/21/2007   UEK:CMKLKJ rectum/Sigmoid diverticula, diminutive hepatic flexure polyp s/p bx. Benign.  COLONOSCOPY N/A 10/17/2012   Dr. Oneida Alar- moderate diverticulosis was noted in the sigmoid colon, moderate sized internal hemorrhoids. next tcs in10 years   ESOPHAGOGASTRODUODENOSCOPY N/A 03/20/2016   Dr. Oneida Alar, widely patent Schatzki ring, anemia due to ASA induced erosive gastritis, mild duodenitis. gastric bx benign without H.pylori.    FOOT  SURGERY  2007   Pins in toes on left foot, 5 to 6 yrs ago   [place] N/A 03/20/2016   Procedure: GIVENS CAPSULE STUDY;  Surgeon: Danie Binder, MD;  Location[facility];  Service: Endoscopy;  Laterality: N/A;   MUSCLE BIOPSY Left 06/07/2018   Procedure: LEFT THIGH MUSCLE BIOPSY;  Surgeon: Erroll Luna, MD;  Location: [facility];  Service: General;  Laterality: Left;   TONSILLECTOMY       SOCIAL HISTORY:  Social History   Socioeconomic History   Marital status: Widowed    Spouse name: Jeneen Rinks    Number of children: 3   Years of education: 12   Highest education level: 12th grade  Occupational History   Occupation: retired   Tobacco Use   Smoking status: Never   Smokeless tobacco: Never   Tobacco comments:    Never smoked  Vaping Use   Vaping Use: Never used  Substance and Sexual Activity   Alcohol use: No   Drug use: No   Sexual activity: Not Currently    Birth control/protection: Post-menopausal, Surgical  Other Topics Concern   Not on file  Social History Narrative   Husband Jeneen Rinks recently passed away   Caffeine use: soda daily   Right handed    Social Determinants of Health   Financial Resource Strain: Low Risk    Difficulty of Paying Living Expenses: Not very hard  Food Insecurity: No Food Insecurity   Worried About Charity fundraiser in the Last Year: Never true   Ran Out of Food in the Last Year: Never true  Transportation Needs: No Transportation Needs   Lack of Transportation (Medical): No   Lack of Transportation (Non-Medical): No  Physical Activity: Inactive   Days of Exercise per Week: 0 days   Minutes of Exercise per Session: 0 min  Stress: Stress Concern Present   Feeling of Stress : To some extent  Social Connections: Socially Isolated   Frequency of Communication with Friends and Family: More than three times a week   Frequency of Social Gatherings with Friends and Family: Once a week   Attends Religious  Services: Never   Marine scientist or Organizations: No   Attends Archivist Meetings: Never   Marital Status: Widowed  Human resources officer Violence: Not At Risk   Fear of Current or Ex-Partner: No   Emotionally Abused: No   Physically Abused: No   Sexually Abused: No    FAMILY HISTORY:  Family History  Problem Relation Age of Onset   Heart disease Father    Bladder Cancer Brother        in remission    Hypertension Sister    Dementia Sister    Diabetes type II Sister    Hypertension Sister    Aneurysm Sister    Hypertension Brother    Arthritis Brother    Hypertension Son    Colon cancer Neg Hx     CURRENT MEDICATIONS:  Outpatient Encounter Medications as of 10/07/2020  Medication Sig Note   acetaminophen (TYLENOL) 650 MG CR tablet Take 650 mg by mouth every 6 (six) hours.  apixaban (ELIQUIS) 5 MG TABS tablet Take 1 tablet (5 mg total) by mouth 2 (two) times daily.    doxycycline (VIBRAMYCIN) 100 MG capsule Take nightly until your scheduled office visit with Dr. Jeffie Pollock    ezetimibe (ZETIA) 10 MG tablet TAKE 1 TABLET EVERY DAY    folic acid (FOLVITE) 1 MG tablet TAKE 1 TABLET EVERY DAY    furosemide (LASIX) 20 MG tablet Take 1 tablet (20 mg total) by mouth daily.    gabapentin (NEURONTIN) 100 MG capsule Take one capsule by mouth every morning and take two capsules at bedtime 07/03/2020: Two tabs tid    lidocaine (LIDODERM) 5 % Place 3 patches onto the skin daily. Remove & Discard patch within 12 hours or as directed by MD    magnesium oxide (MAG-OX) 400 (241.3 Mg) MG tablet TAKE 1 TABLET TWICE DAILY    methotrexate (RHEUMATREX) 2.5 MG tablet Take 5 tablets (12.5 mg total) by mouth once a week. Caution:Chemotherapy. Protect from light.    NON FORMULARY Fluid restriction of 1.8 liters per day (1785m total) 7-3 = 800 ml 3-11 = 800 ml 11-7 = 175 ml Document totals qshift. Every Shift Day, Evening, Nigh    NON FORMULARY Diet: _____ Regular, __x____  NAS, _______Consistent Carbohydrate, _______NPO _____Other    predniSONE (DELTASONE) 5 MG tablet Take 2 tablets (10 mg total) by mouth daily with breakfast.    triamterene-hydrochlorothiazide (MAXZIDE-25) 37.5-25 MG tablet TAKE 1 TABLET EVERY DAY (DOSE REDUCTION)    No facility-administered encounter medications on file as of 10/07/2020.    ALLERGIES:  Allergies  Allergen Reactions   Cellcept [Mycophenolate Mofetil]     Muscle cramps, mouth soreness, difficulty swallowing, bruising   Statins     Dx of in   Ciprofloxacin Hcl Other (See Comments)    Chills, sick, could not tolerate it   Hydrocodone Nausea Only   Penicillins Rash     PHYSICAL EXAM:  ECOG PERFORMANCE STATUS: 2 - Symptomatic, <50% confined to bed  There were no vitals filed for this visit. There were no vitals filed for this visit. Physical Exam Constitutional:      Appearance: Normal appearance.  HENT:     Head: Normocephalic and atraumatic.     Mouth/Throat:     Mouth: Mucous membranes are moist.  Eyes:     Extraocular Movements: Extraocular movements intact.     Pupils: Pupils are equal, round, and reactive to light.  Cardiovascular:     Rate and Rhythm: Normal rate and regular rhythm.     Pulses: Normal pulses.     Heart sounds: Normal heart sounds.  Pulmonary:     Effort: Pulmonary effort is normal.     Breath sounds: Normal breath sounds.  Abdominal:     General: Bowel sounds are normal.     Palpations: Abdomen is soft.     Tenderness: There is no abdominal tenderness.  Musculoskeletal:        General: No swelling.     Right lower leg: Edema (2+) present.     Left lower leg: Edema (2+) present.  Lymphadenopathy:     Cervical: No cervical adenopathy.  Skin:    General: Skin is warm and dry.  Neurological:     General: No focal deficit present.     Mental Status: She is alert and oriented to person, place, and time.  Psychiatric:        Mood and Affect: Mood normal.        Behavior:  Behavior normal.     LABORATORY DATA:  I have reviewed the labs as listed.  CBC    Component Value Date/Time   WBC 8.0 09/30/2020 1105   RBC 4.02 09/30/2020 1105   HGB 12.9 09/30/2020 1105   HGB 12.8 06/17/2020 1214   HCT 40.4 09/30/2020 1105   HCT 36.9 06/17/2020 1214   PLT 193 09/30/2020 1105   PLT 206 06/17/2020 1214   MCV 100.5 (H) 09/30/2020 1105   MCV 91 06/17/2020 1214   MCH 32.1 09/30/2020 1105   MCHC 31.9 09/30/2020 1105   RDW 12.3 09/30/2020 1105   RDW 13.9 06/17/2020 1214   LYMPHSABS 1.1 09/30/2020 1105   LYMPHSABS 0.9 06/17/2020 1214   MONOABS 0.5 09/30/2020 1105   EOSABS 0.2 09/30/2020 1105   EOSABS 0.1 06/17/2020 1214   BASOSABS 0.0 09/30/2020 1105   BASOSABS 0.0 06/17/2020 1214   CMP Latest Ref Rng & Units 09/30/2020 06/17/2020 03/13/2020  Glucose 70 - 99 mg/dL 106(H) 99 103(H)  BUN 8 - 23 mg/dL 27(H) 30(H) 29(H)  Creatinine 0.44 - 1.00 mg/dL 0.82 0.76 0.70  Sodium 135 - 145 mmol/L 139 143 143  Potassium 3.5 - 5.1 mmol/L 3.7 4.0 4.5  Chloride 98 - 111 mmol/L 102 103 103  CO2 22 - 32 mmol/L 29 24 25   Calcium 8.9 - 10.3 mg/dL 8.8(L) 9.5 9.8  Total Protein 6.5 - 8.1 g/dL 6.2(L) 6.2 6.2  Total Bilirubin 0.3 - 1.2 mg/dL 0.8 0.5 0.4  Alkaline Phos 38 - 126 U/L 40 50 54  AST 15 - 41 U/L 28 24 66(H)  ALT 0 - 44 U/L 22 23 44(H)    DIAGNOSTIC IMAGING:  I have independently reviewed the relevant imaging and discussed with the patient.  ASSESSMENT & PLAN: 1.  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 - Repeat ultrasound on 08/05/2019 showed occlusive DVT involving left popliteal vein with extension to left posterior tibial vein and one of the paired left peroneal veins similar to 02/10/2019 imaging study -  Ultrasound on 08/23/2019 showed no evidence of acute or chronic DVT within either lower extremity, with special attention to left popliteal and tibial veins.  There was noted to be a 16.6 x 3.9 x 3.5 cm complex fluid collection extending from the left popliteal fossa to the proximal mid aspect of the left calf, which was not seen on 08/05/2019 (this was found to be hemarthrosis of the left knee joint, s/p aspiration and Depo-Medrol injection by Dr. Aline Brochure on 09/08/2019 with 40 cc of aspirated blood) - Due to hemarthrosis described above, Eliquis was discontinued again on 09/05/2019 - Bilateral lower extremity ultrasound on 09/18/2019 was negative for DVT - Bilateral venous ultrasound (11/13/2019): New partially occlusive thrombus in the left popliteal vein - Patient restarted on Eliquis 11/13/2019 - Left lower extremity venous US (03/08/2020): Resolution of left lower extremity DVT - Most recent D-dimer (09/30/2020): Normal at 0.41; no current symptoms of recurrent DVT  - At this juncture, would recommend lifelong anticoagulation, as patient has had recurrent left lower extremity DVTs each time that Eliquis has been discontinued - Patient currently taking Eliquis 5 mg twice daily, tolerating well without any symptoms of blood loss  - PLAN: Continue Eliquis indefinitely.  Repeat D-dimer and CBC at follow-up in 6 months.  Patient educated on alarm symptoms that would prompt immediate medical attention.  2.  Normocytic anemia, resolved - Likely anemia of chronic disease - Renal function stable without obvious signs of renal dysfunction - Patient was instructed to stop taking iron tablet by her PCP - Most recent CBC (09/30/2020): No current anemia noted, Hgb 12.9; normal iron panel - PLAN:  Repeat CBC and iron panel at follow-up in 6 months.  3.  Vitamin D deficiency - Most recent vitamin D (09/30/2020): Decreased at 28.69 - She was previously on vitamin D 50,000 units weekly, discontinued by PCP - PLAN: We  will restart patient on ergocalciferol 50,000 units weekly.  Repeat vitamin D at next visit.   4.  History of endometrial adenocarcinoma - She had T1b N0 well-differentiated endometrioid adenocarcinoma, FIGO grade 1, TAH/BSO with lymphadenectomy done on 12/14/2006.  She did not require any adjuvant therapy. CT scan on 03/06/2019 did not show any evidence of recurrence or metastatic disease - Denies vaginal bleeding, abdominal pain, B symptoms, unintentional weight loss   PLAN SUMMARY & DISPOSITION: -Labs and RTC in 6 months  All questions were answered. The patient knows to call the clinic with any problems, questions or concerns.  Medical decision making: Moderate  Time spent on visit: I spent 15 minutes counseling the patient face to face. The total time spent in the appointment was 25 minutes and more than 50% was on counseling.   Harriett Rush, PA-C  10/07/2020 11:25 AM

## 2020-10-07 ENCOUNTER — Inpatient Hospital Stay (HOSPITAL_COMMUNITY): Payer: Medicare HMO | Admitting: Physician Assistant

## 2020-10-07 ENCOUNTER — Other Ambulatory Visit: Payer: Self-pay

## 2020-10-07 VITALS — BP 121/72 | HR 77 | Temp 97.0°F | Resp 18

## 2020-10-07 DIAGNOSIS — Z86718 Personal history of other venous thrombosis and embolism: Secondary | ICD-10-CM | POA: Diagnosis not present

## 2020-10-07 DIAGNOSIS — Z862 Personal history of diseases of the blood and blood-forming organs and certain disorders involving the immune mechanism: Secondary | ICD-10-CM | POA: Diagnosis not present

## 2020-10-07 DIAGNOSIS — D649 Anemia, unspecified: Secondary | ICD-10-CM

## 2020-10-07 DIAGNOSIS — I82532 Chronic embolism and thrombosis of left popliteal vein: Secondary | ICD-10-CM | POA: Diagnosis not present

## 2020-10-07 DIAGNOSIS — Z7901 Long term (current) use of anticoagulants: Secondary | ICD-10-CM | POA: Diagnosis not present

## 2020-10-07 DIAGNOSIS — E559 Vitamin D deficiency, unspecified: Secondary | ICD-10-CM | POA: Diagnosis not present

## 2020-10-07 DIAGNOSIS — C549 Malignant neoplasm of corpus uteri, unspecified: Secondary | ICD-10-CM | POA: Diagnosis not present

## 2020-10-07 MED ORDER — ERGOCALCIFEROL 1.25 MG (50000 UT) PO CAPS: 50000.0000 [IU] | ORAL_CAPSULE | ORAL | 5 refills | Status: DC

## 2020-10-07 NOTE — Patient Instructions (Signed)
[place] at [facility] Discharge Instructions  You were seen today by Tarri Abernethy PA-C for your anemia and history of blood clots.  Your blood and iron levels look great at this time, no need for further iron supplementation for now.  You do need to start taking vitamin D supplement (ergocalciferol 50,000 units weekly).  We will recheck your vitamin D at your next visit.  LABS: Return in 6 months for repeat labs  OTHER TESTS: None  MEDICATIONS: Continue Eliquis 5 mg twice a day for your blood clots  FOLLOW-UP APPOINTMENT: Office visit in 6 months   Thank you for choosing [place] at [facility] to provide your oncology and hematology care.  To afford each patient quality time with our provider, please arrive at least 15 minutes before your scheduled appointment time.   If you have a lab appointment with the [facility] please come in thru the Main Entrance and check in at the main information desk.  You need to re-schedule your appointment should you arrive 10 or more minutes late.  We strive to give you quality time with our providers, and arriving late affects you and other patients whose appointments are after yours.  Also, if you no show three or more times for appointments you may be dismissed from the clinic at the providers discretion.     Again, thank you for choosing [facility].  Our hope is that these requests will decrease the amount of time that you wait before being seen by our physicians.       _____________________________________________________________  Should you have questions after your visit to [facility], please contact our office at [phone] and follow the prompts.  Our office hours are 8:00 a.m. and 4:30 p.m. Monday - Friday.  Please note that voicemails left after 4:00 p.m. may not be returned until the following business day.  We are closed weekends and major holidays.  You do  have access to a nurse 24-7, just call the main number to the clinic [phone] and do not press any options, hold on the line and a nurse will answer the phone.    For prescription refill requests, have your pharmacy contact our office and allow 72 hours.    Due to Covid, you will need to wear a mask upon entering the hospital. If you do not have a mask, a mask will be given to you at the Main Entrance upon arrival. For doctor visits, patients may have 1 support person age 10 or older with them. For treatment visits, patients can not have anyone with them due to social distancing guidelines and our immunocompromised population.

## 2020-10-10 ENCOUNTER — Telehealth: Payer: Self-pay

## 2020-10-10 ENCOUNTER — Telehealth (HOSPITAL_COMMUNITY): Payer: Self-pay | Admitting: *Deleted

## 2020-10-10 ENCOUNTER — Other Ambulatory Visit (HOSPITAL_COMMUNITY): Payer: Self-pay | Admitting: *Deleted

## 2020-10-10 ENCOUNTER — Other Ambulatory Visit: Payer: Self-pay

## 2020-10-10 ENCOUNTER — Encounter (HOSPITAL_COMMUNITY): Payer: Self-pay | Admitting: *Deleted

## 2020-10-10 ENCOUNTER — Emergency Department (HOSPITAL_COMMUNITY)
Admission: EM | Admit: 2020-10-10 | Discharge: 2020-10-10 | Disposition: A | Discharge status: Home / Self Care | Payer: Medicare HMO | Attending: Emergency Medicine | Admitting: Emergency Medicine

## 2020-10-10 DIAGNOSIS — I82532 Chronic embolism and thrombosis of left popliteal vein: Secondary | ICD-10-CM

## 2020-10-10 DIAGNOSIS — Z79899 Other long term (current) drug therapy: Secondary | ICD-10-CM | POA: Insufficient documentation

## 2020-10-10 DIAGNOSIS — Z8542 Personal history of malignant neoplasm of other parts of uterus: Secondary | ICD-10-CM | POA: Insufficient documentation

## 2020-10-10 DIAGNOSIS — N390 Urinary tract infection, site not specified: Secondary | ICD-10-CM

## 2020-10-10 DIAGNOSIS — K921 Melena: Secondary | ICD-10-CM | POA: Diagnosis not present

## 2020-10-10 DIAGNOSIS — K625 Hemorrhage of anus and rectum: Secondary | ICD-10-CM | POA: Diagnosis not present

## 2020-10-10 DIAGNOSIS — I10 Essential (primary) hypertension: Secondary | ICD-10-CM | POA: Insufficient documentation

## 2020-10-10 DIAGNOSIS — R195 Other fecal abnormalities: Secondary | ICD-10-CM | POA: Diagnosis not present

## 2020-10-10 DIAGNOSIS — Z7901 Long term (current) use of anticoagulants: Secondary | ICD-10-CM | POA: Diagnosis not present

## 2020-10-10 DIAGNOSIS — E876 Hypokalemia: Secondary | ICD-10-CM

## 2020-10-10 LAB — PROTIME-INR
INR: 1.3 — ABNORMAL HIGH (ref 0.8–1.2)
Prothrombin Time: 15.9 seconds — ABNORMAL HIGH (ref 11.4–15.2)

## 2020-10-10 LAB — COMPREHENSIVE METABOLIC PANEL
ALT: 23 U/L (ref 0–44)
AST: 30 U/L (ref 15–41)
Albumin: 3.5 g/dL (ref 3.5–5.0)
Alkaline Phosphatase: 37 U/L — ABNORMAL LOW (ref 38–126)
Anion gap: 12 (ref 5–15)
BUN: 34 mg/dL — ABNORMAL HIGH (ref 8–23)
CO2: 26 mmol/L (ref 22–32)
Calcium: 9.2 mg/dL (ref 8.9–10.3)
Chloride: 103 mmol/L (ref 98–111)
Creatinine, Ser: 0.82 mg/dL (ref 0.44–1.00)
GFR, Estimated: >60 mL/min (ref >60)
Glucose, Bld: 94 mg/dL (ref 70–99)
Potassium: 3.3 mmol/L — ABNORMAL LOW (ref 3.5–5.1)
Sodium: 141 mmol/L (ref 135–145)
Total Bilirubin: 0.9 mg/dL (ref 0.3–1.2)
Total Protein: 6.1 g/dL — ABNORMAL LOW (ref 6.5–8.1)

## 2020-10-10 LAB — TYPE AND SCREEN
ABO/RH(D): B POS
Antibody Screen: NEGATIVE

## 2020-10-10 LAB — CBC
HCT: 39.6 % (ref 36.0–46.0)
Hemoglobin: 12.6 g/dL (ref 12.0–15.0)
MCH: 32.8 pg (ref 26.0–34.0)
MCHC: 31.8 g/dL (ref 30.0–36.0)
MCV: 103.1 fL — ABNORMAL HIGH (ref 80.0–100.0)
Platelets: 188 10*3/uL (ref 150–400)
RBC: 3.84 MIL/uL — ABNORMAL LOW (ref 3.87–5.11)
RDW: 12.7 % (ref 11.5–15.5)
WBC: 7.5 10*3/uL (ref 4.0–10.5)
nRBC: 0 % (ref 0.0–0.2)

## 2020-10-10 NOTE — Telephone Encounter (Signed)
TC received from patient with questions regarding follow up from today's ED visit. Records reviewed and discussed with Tarri Abernethy.  Per patient she has been having maroon colored stools for a couple of days, along with blood clots in her urine.  Was supposed to have a cysto in June, which was rescheduled until September.   Advised to contact Dr Ralene Muskrat office with details regarding clots.  States she left a message and was awaiting a return call.  As for her Eliquis, she will hold her doses over the weekend and come by the office tomorrow to pick up stool cards, which she will bring back on Monday when she reports to the lab for repeat CBC.  Advised to report back to the ED if she has any reo currant symptoms of DVT or increased bleeding, with associated symptoms.  Verbalized understanding.

## 2020-10-10 NOTE — Discharge Instructions (Addendum)
Hold your next Eliquis dose. Hold your antibiotics and discuss with primary doctor when to resume. If bleeding worsens or you develop lightheadedness, shortness of breath, extreme fatigue or feel he can pass out return the emergency room.  Follow-up with gastroenterology to plan camera scope.

## 2020-10-10 NOTE — ED Provider Notes (Signed)
[facility] EMERGENCY DEPARTMENT Provider Note   CSN: [id number] Arrival date & time: 10/10/20  2376     History Chief Complaint  Patient presents with   Rectal Bleeding    [name] [name] is a 87 y.o. female.  Patient with DVT left leg on Eliquis, cholesterol, high blood pressure presents with blood clots and dark blood in her stool since yesterday.  No abdominal pain, no lightheadedness or passing out.  No fatigue.  Episodes intermittent.  Patient has gastroenterology to follow-up with has had colonoscopy and was told was cleared for 10 years.  Patient has been taking antibiotic unsure exactly why.      Past Medical History:  Diagnosis Date   Acute deep vein thrombosis (DVT) of left femoral vein ([place]) 02/07/2019   Dx 02/05/2019   Arthritis    spinal stenosis   Deep vein thrombosis (DVT) of left lower extremity ([place]) 08/04/2019   Elevated LFTs    secondary to fatty liver, negative work-up in 2011   Hypercholesterolemia    Hypertension    Lumbar spinal stenosis 01/14/2018   L3-4 and L4-5   Myopathy 02/15/2018   Osteoarthritis    right knee   Peripheral neuropathy 07/06/2018   PONV (postoperative nausea and vomiting)    Uterine cancer ([place]) 08/2006   grade 1, no recurrence up to 2013    Patient Active Problem List   Diagnosis Date Noted   HZV (herpes zoster virus) post herpetic neuralgia 05/31/2020   Zoster 04/30/2020   Hemarthrosis involving knee joint, left 08/11/2019   Hypomagnesemia 08/11/2019   Weakness generalized 08/09/2019   Lower extremity edema 08/06/2019   Bilateral lower extremity edema 08/04/2019   Hypokalemia 08/04/2019   Hypoalbuminemia 08/04/2019   Venous stasis ulcers ([place]) 04/03/2019   Lower leg edema 03/22/2019   At high risk for falls per [name] fall risk assessment scale 02/09/2019   Neck pain, bilateral posterior 01/13/2019   Statins contraindicated 08/14/2018   Peripheral neuropathy 07/06/2018   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   Lumbar  spinal stenosis 01/14/2018   Abnormal weight loss 07/29/2017   GERD (gastroesophageal reflux disease) 06/17/2016   Normocytic anemia    Sciatica of left side associated with disorder of lumbosacral spine 11/29/2014   Chronic constipation 10/04/2012   Urinary incontinence 06/20/2012   Vitamin D deficiency 08/22/2011   Prediabetes 05/29/2011   NASH (nonalcoholic steatohepatitis) 11/05/2009   Arthritis of right knee 09/04/2009   Hyperlipidemia LDL goal <100 09/08/2007   Essential hypertension 09/08/2007    Past Surgical History:  Procedure Laterality Date   ABDOMINAL HYSTERECTOMY  2008   adenocarcinoma stage 1   APPENDECTOMY  1973   BREAST BIOPSY Left 2018   Benign   CHOLECYSTECTOMY  1973   COLONOSCOPY    06/21/2007   EGB:TDVVOH rectum/Sigmoid diverticula, diminutive hepatic flexure polyp s/p bx. Benign.    COLONOSCOPY N/A 10/17/2012   Dr. Oneida Alar- moderate diverticulosis was noted in the sigmoid colon, moderate sized internal hemorrhoids. next tcs in10 years   ESOPHAGOGASTRODUODENOSCOPY N/A 03/20/2016   Dr. Oneida Alar, widely patent Schatzki ring, anemia due to ASA induced erosive gastritis, mild duodenitis. gastric bx benign without H.pylori.    FOOT SURGERY  2007   Pins in toes on left foot, 5 to 6 yrs ago   [place] N/A 03/20/2016   Procedure: GIVENS CAPSULE STUDY;  Surgeon: Danie Binder, MD;  Location: [facility];  Service: Endoscopy;  Laterality: N/A;   MUSCLE BIOPSY Left 06/07/2018   Procedure: LEFT THIGH  MUSCLE BIOPSY;  Surgeon: Erroll Luna, MD;  Location: [facility];  Service: General;  Laterality: Left;   TONSILLECTOMY       OB History   No obstetric history on file.     Family History  Problem Relation Age of Onset   Heart disease Father    Bladder Cancer Brother        in remission    Hypertension Sister    Dementia Sister    Diabetes type II Sister    Hypertension Sister    Aneurysm Sister    Hypertension Brother     Arthritis Brother    Hypertension Son    Colon cancer Neg Hx     Social History   Tobacco Use   Smoking status: Never   Smokeless tobacco: Never   Tobacco comments:    Never smoked  Vaping Use   Vaping Use: Never used  Substance Use Topics   Alcohol use: No   Drug use: No    Home Medications Prior to Admission medications   Medication Sig Start Date End Date Taking? Authorizing Provider  acetaminophen (TYLENOL) 650 MG CR tablet Take 650 mg by mouth every 6 (six) hours.  08/08/19   [provider]  apixaban (ELIQUIS) 5 MG TABS tablet Take 1 tablet (5 mg total) by mouth 2 (two) times daily. 09/25/20   Derek Jack, MD  doxycycline (VIBRAMYCIN) 100 MG capsule Take nightly until your scheduled office visit with Dr. Jeffie Pollock 09/30/20   Irine Seal, MD  ergocalciferol (VITAMIN D2) 1.25 MG (50000 UT) capsule Take 1 capsule (50,000 Units total) by mouth once a week. 10/07/20   Harriett Rush, PA-C  ezetimibe (ZETIA) 10 MG tablet TAKE 1 TABLET EVERY DAY 04/15/20   Perlie Mayo, NP  folic acid (FOLVITE) 1 MG tablet TAKE 1 TABLET EVERY DAY 08/06/20   Derek Jack, MD  furosemide (LASIX) 20 MG tablet Take 1 tablet (20 mg total) by mouth daily. 02/19/20   Fayrene Helper, MD  gabapentin (NEURONTIN) 100 MG capsule Take one capsule by mouth every morning and take two capsules at bedtime 06/12/20   Fayrene Helper, MD  lidocaine (LIDODERM) 5 % Place 3 patches onto the skin daily. Remove & Discard patch within 12 hours or as directed by MD 05/28/20   Fayrene Helper, MD  magnesium oxide (MAG-OX) 400 (241.3 Mg) MG tablet TAKE 1 TABLET TWICE DAILY 06/18/20   Derek Jack, MD  methotrexate (RHEUMATREX) 2.5 MG tablet Take 5 tablets (12.5 mg total) by mouth once a week. Caution:Chemotherapy. Protect from light. 06/18/20   Kathrynn Ducking, MD  NON FORMULARY Fluid restriction of 1.8 liters per day (1716m total) 7-3 = 800 ml 3-11 = 800 ml 11-7 = 175 ml Document  totals qshift. Every Shift Day, Evening, Nigh 08/07/19   [provider]  NON FORMULARY Diet: _____ Regular, __x____ NAS, _______Consistent Carbohydrate, _______NPO _____Other    [provider]  predniSONE (DELTASONE) 5 MG tablet Take 2 tablets (10 mg total) by mouth daily with breakfast. 06/26/20   WKathrynn Ducking MD  triamterene-hydrochlorothiazide (MAXZIDE-25) 37.5-25 MG tablet TAKE 1 TABLET EVERY DAY (DOSE REDUCTION) 09/16/20   SFayrene Helper MD    Allergies    Cellcept [mycophenolate mofetil], Statins, Ciprofloxacin hcl, Hydrocodone, and Penicillins  Review of Systems   Review of Systems  Constitutional:  Negative for chills and fever.  HENT:  Negative for congestion.   Eyes:  Negative for visual disturbance.  Respiratory:  Negative for shortness of breath.   Cardiovascular:  Negative for chest pain.  Gastrointestinal:  Positive for blood in stool. Negative for abdominal pain and vomiting.  Genitourinary:  Negative for dysuria and flank pain.  Musculoskeletal:  Negative for back pain, neck pain and neck stiffness.  Skin:  Negative for rash.  Neurological:  Negative for light-headedness and headaches.   Physical Exam Updated Vital Signs BP 126/64 (BP Location: Right Arm)   Pulse 80   Temp 98.4 F (36.9 C) (Oral)   Resp 18   Ht 5' 5.5" (1.664 m)   Wt 70.3 kg   SpO2 100%   BMI 25.40 kg/m   Physical Exam Vitals and nursing note reviewed.  Constitutional:      General: She is not in acute distress.    Appearance: She is well-developed.  HENT:     Head: Normocephalic and atraumatic.     Mouth/Throat:     Mouth: Mucous membranes are moist.  Eyes:     General:        Right eye: No discharge.        Left eye: No discharge.     Conjunctiva/sclera: Conjunctivae normal.  Neck:     Trachea: No tracheal deviation.  Cardiovascular:     Rate and Rhythm: Normal rate and regular rhythm.     Heart sounds: No murmur heard. Pulmonary:     Effort:  Pulmonary effort is normal.     Breath sounds: Normal breath sounds.  Abdominal:     General: There is no distension.     Palpations: Abdomen is soft.     Tenderness: There is no abdominal tenderness. There is no guarding.  Genitourinary:    Comments: Purple/maroon-colored stool, no external hemorrhoids appreciated. Musculoskeletal:     Cervical back: Normal range of motion and neck supple. No rigidity.  Skin:    General: Skin is warm.     Capillary Refill: Capillary refill takes less than 2 seconds.     Findings: No rash.  Neurological:     General: No focal deficit present.     Mental Status: She is alert.     Cranial Nerves: No cranial nerve deficit.  Psychiatric:        Mood and Affect: Mood normal.    ED Results / Procedures / Treatments   Labs (all labs ordered are listed, but only abnormal results are displayed) Labs Reviewed  COMPREHENSIVE METABOLIC PANEL - Abnormal; Notable for the following components:      Result Value   Potassium 3.3 (*)    BUN 34 (*)    Total Protein 6.1 (*)    Alkaline Phosphatase 37 (*)    All other components within normal limits  CBC - Abnormal; Notable for the following components:   RBC 3.84 (*)    MCV 103.1 (*)    All other components within normal limits  PROTIME-INR - Abnormal; Notable for the following components:   Prothrombin Time 15.9 (*)    INR 1.3 (*)    All other components within normal limits  POC OCCULT BLOOD, ED  TYPE AND SCREEN    EKG None  Radiology No results found.  Procedures Procedures   Medications Ordered in ED Medications - No data to display  ED Course  I have reviewed the triage vital signs and the nursing notes.  Pertinent labs & imaging results that were available during my care of the patient were reviewed by me and considered in my medical  decision making (see chart for details).    MDM Rules/Calculators/A&P                           Patient on Eliquis for DVT presents with intermittent  blood in the stools last 24 hours.  Patient has no evidence of significant anemia on exam, hemoglobin normal reviewed, normal platelets.  No hematemesis.differential includes diverticular or ulcer or other leak likely secondary to blood thinner use which may have been worsened due to antibiotic use recently.  Patient well-appearing no abdominal pain.  Patient has outpatient follow-up.  Discussed holding antibiotics and holding next dose of Eliquis with close follow-up and communication with primary doctor.  No indication for emergent admission at this time however patient will need close outpatient follow-up given blood thinner, blood in stools and age.  Remainder of blood work unremarkable except for mild hypokalemia 3.3.   Final Clinical Impression(s) / ED Diagnoses Final diagnoses:  Occult blood in stools    Rx / DC Orders ED Discharge Orders     None        Elnora Morrison, MD 10/10/20 1245

## 2020-10-10 NOTE — ED Triage Notes (Signed)
Pt c/o dark red rectal bleeding that started yesterday. Denies dizziness, pain.

## 2020-10-10 NOTE — ED Provider Notes (Signed)
Emergency Medicine Provider Triage Evaluation Note  [name] [name] , a 87 y.o. female  was evaluated in triage.  Pt complains of rectal bleeding since yesterday.  States that she noticed bright red blood from her rectum after having a BM yesterday.  She describes having "rumbling" of her abdomen yesterday and after second BM yesterday afternoon noticed small dark "clots" of blood.  She currently takes Eliquis twice daily for DVT of the left lower extremity.  No prior bleeding events and she has been on the anticoagulant for more than 1 year.  Denies abdominal pain, dizziness, syncope, nausea or vomiting.  Denies any aspirin or NSAID use.    Review of Systems  Positive: Rectal bleeding Negative: Dizziness, weakness, abdominal pain and fever  Physical Exam  BP 126/64 (BP Location: Right Arm)   Pulse 80   Temp 98.4 F (36.9 C) (Oral)   Resp 18   Ht 5' 5.5" (1.664 m)   Wt 70.3 kg   SpO2 100%   BMI 25.40 kg/m  Gen:   Awake, no distress   Resp:  Normal effort  MSK:   Moves extremities without difficulty  Other:  Edema the bilateral lower extremities  Medical Decision Making  Medically screening exam initiated at 10:53 AM.  Appropriate orders placed.  ZAHIRAH CHESLOCK was informed that the remainder of the evaluation will be completed by another provider, this initial triage assessment does not replace that evaluation, and the importance of remaining in the ED until their evaluation is complete.  Patient here for evaluation of rectal bleeding.  Anticoagulated with Xarelto for DVT.  She will need further evaluation in emergency department.  Patient is agreeable to plan.   Kem Parkinson, PA-C 10/10/20 1057    Elnora Morrison, MD 10/10/20 1242

## 2020-10-10 NOTE — Telephone Encounter (Signed)
ERROR

## 2020-10-11 NOTE — Telephone Encounter (Signed)
No UA or culture was done while in the ER.  Patient needs to come by next week for a UA and culture to make sure that her UTI hasn't recurred and then she will need to be moved up into August for the cystoscopy if possible.  -Dr. Jeffie Pollock  Patient notified and appointments made.

## 2020-10-11 NOTE — Addendum Note (Signed)
Addended by: Tommy Rainwater on: 10/11/2020 11:10 AM   Modules accepted: Orders

## 2020-10-14 ENCOUNTER — Other Ambulatory Visit: Payer: Self-pay

## 2020-10-14 ENCOUNTER — Telehealth: Payer: Self-pay

## 2020-10-14 ENCOUNTER — Other Ambulatory Visit: Payer: Medicare HMO

## 2020-10-14 ENCOUNTER — Inpatient Hospital Stay (HOSPITAL_COMMUNITY): Payer: Medicare HMO

## 2020-10-14 DIAGNOSIS — I82532 Chronic embolism and thrombosis of left popliteal vein: Secondary | ICD-10-CM

## 2020-10-14 DIAGNOSIS — Z86718 Personal history of other venous thrombosis and embolism: Secondary | ICD-10-CM | POA: Diagnosis not present

## 2020-10-14 DIAGNOSIS — R195 Other fecal abnormalities: Secondary | ICD-10-CM

## 2020-10-14 DIAGNOSIS — Z862 Personal history of diseases of the blood and blood-forming organs and certain disorders involving the immune mechanism: Secondary | ICD-10-CM | POA: Diagnosis not present

## 2020-10-14 DIAGNOSIS — N390 Urinary tract infection, site not specified: Secondary | ICD-10-CM

## 2020-10-14 DIAGNOSIS — Z7901 Long term (current) use of anticoagulants: Secondary | ICD-10-CM | POA: Diagnosis not present

## 2020-10-14 DIAGNOSIS — E559 Vitamin D deficiency, unspecified: Secondary | ICD-10-CM | POA: Diagnosis not present

## 2020-10-14 LAB — URINALYSIS, ROUTINE W REFLEX MICROSCOPIC
Bilirubin, UA: NEGATIVE
Glucose, UA: NEGATIVE
Ketones, UA: NEGATIVE
Leukocytes,UA: NEGATIVE
Nitrite, UA: NEGATIVE
Protein,UA: NEGATIVE
RBC, UA: NEGATIVE
Specific Gravity, UA: 1.02 (ref 1.005–1.030)
Urobilinogen, Ur: 0.2 mg/dL (ref 0.2–1.0)
pH, UA: 6.5 (ref 5.0–7.5)

## 2020-10-14 LAB — CBC WITH DIFFERENTIAL/PLATELET
Abs Immature Granulocytes: 0.04 10*3/uL (ref 0.00–0.07)
Basophils Absolute: 0 10*3/uL (ref 0.0–0.1)
Basophils Relative: 1 %
Eosinophils Absolute: 0.1 10*3/uL (ref 0.0–0.5)
Eosinophils Relative: 1 %
HCT: 36.6 % (ref 36.0–46.0)
Hemoglobin: 11.7 g/dL — ABNORMAL LOW (ref 12.0–15.0)
Immature Granulocytes: 1 %
Lymphocytes Relative: 17 %
Lymphs Abs: 1.4 10*3/uL (ref 0.7–4.0)
MCH: 32.2 pg (ref 26.0–34.0)
MCHC: 32 g/dL (ref 30.0–36.0)
MCV: 100.8 fL — ABNORMAL HIGH (ref 80.0–100.0)
Monocytes Absolute: 0.7 10*3/uL (ref 0.1–1.0)
Monocytes Relative: 9 %
Neutro Abs: 5.7 10*3/uL (ref 1.7–7.7)
Neutrophils Relative %: 71 %
Platelets: 198 10*3/uL (ref 150–400)
RBC: 3.63 MIL/uL — ABNORMAL LOW (ref 3.87–5.11)
RDW: 12.5 % (ref 11.5–15.5)
WBC: 7.9 10*3/uL (ref 4.0–10.5)
nRBC: 0 % (ref 0.0–0.2)

## 2020-10-14 NOTE — Telephone Encounter (Signed)
Patient is needing to know if she needs to continue on the antibiotics prescribed.  She was taken off of the apixaban (ELIQUIS) 5 MG TABS tablet due to blood in urine.  Please advise.  Call back: [phone]

## 2020-10-14 NOTE — Telephone Encounter (Signed)
Patient returned call to office.   Pt will stay on current abx therapy until culture results. Pt voiced understanding.

## 2020-10-14 NOTE — Telephone Encounter (Signed)
Left message for patient to return call.

## 2020-10-16 ENCOUNTER — Encounter: Payer: Self-pay | Admitting: Family Medicine

## 2020-10-16 ENCOUNTER — Ambulatory Visit (INDEPENDENT_AMBULATORY_CARE_PROVIDER_SITE_OTHER): Payer: Medicare HMO | Admitting: Family Medicine

## 2020-10-16 ENCOUNTER — Other Ambulatory Visit: Payer: Self-pay

## 2020-10-16 VITALS — BP 120/70 | HR 86

## 2020-10-16 DIAGNOSIS — B028 Zoster with other complications: Secondary | ICD-10-CM

## 2020-10-16 DIAGNOSIS — I10 Essential (primary) hypertension: Secondary | ICD-10-CM

## 2020-10-16 DIAGNOSIS — Z9181 History of falling: Secondary | ICD-10-CM

## 2020-10-16 DIAGNOSIS — R531 Weakness: Secondary | ICD-10-CM

## 2020-10-16 DIAGNOSIS — R600 Localized edema: Secondary | ICD-10-CM | POA: Diagnosis not present

## 2020-10-16 DIAGNOSIS — E785 Hyperlipidemia, unspecified: Secondary | ICD-10-CM

## 2020-10-16 DIAGNOSIS — Z09 Encounter for follow-up examination after completed treatment for conditions other than malignant neoplasm: Secondary | ICD-10-CM | POA: Insufficient documentation

## 2020-10-16 LAB — CULTURE, URINE COMPREHENSIVE

## 2020-10-16 NOTE — Assessment & Plan Note (Signed)
improved

## 2020-10-16 NOTE — Patient Instructions (Addendum)
F/u as before end August , call if you need me sooner  I have sent  message to Dr Jeffie Pollock and wil send one to Oncology for guidance e Eliqis  Thanks for choosing [facility], we consider it a privelige to serve you.

## 2020-10-16 NOTE — Progress Notes (Signed)
   [name] [name]     MRN: [id number]      DOB: [birth year]   HPI [name] [name] is here for follow up of recent eD visit, on 10/10/2020 for BRRBwhile on eliquis, she does have sigmoid  diverticulosis. Bleeding varied from maroon stool to [facility] and occurred over a 2 day period. States stool is now back to normal color, and she feels fine, denies light headedness. Repeat lab since ED visit shows stable HB, has not yet heard definitively from heme/ onc whether her eliquis is to be restarted Improved care with weight gain, being managed by a family member in law, assists with baths and feedsher and  keeps home clean,  [name] [name] still has some discomfort over right jaw where she had shingles ROS Denies recent fever or chills. Denies sinus pressure, nasal congestion, ear pain or sore throat. Denies chest congestion, productive cough or wheezing. Denies chest pains, palpitations and leg swelling Denies abdominal pain, nausea, vomiting,diarrhea or constipation.   Denies dysuria, frequency, c/o incontinence. Chronic  joint pain, swelling and limitation in mobility. Denies headaches, seizures, numbness, or tingling. Denies depression, anxiety or insomnia.  PE  BP 120/70   Pulse 86   SpO2 98%   Patient alert and oriented and in no cardiopulmonary distress.  HEENT: No facial asymmetry, EOMI,     Neck decreased though adequate ROM.  Chest: Clear to auscultation bilaterally.  CVS: S1, S2 no murmurs, no S3.Regular rate.  ABD: Soft non tender.   Ext: trace edema  MS: decreased  ROM spine, shoulders, hips and knees.  Skin: Intact, no ulcerations  noted.Rash on right jaw  Psych: Good eye contact, normal affect. Memory intact not anxious or depressed appearing.  CNS: CN 2-12 intact, no focal deficits on gross exam Assessment & Plan  Encounter for examination following treatment at hospital Patient in for follow up of recent ED evaluation on 7/21/20022 for BRRB on eliquis and prednisone 20 mg with  diverticulosis. No visible bleeding since stoppingelpiquis, waiting on decision of heme/ onc as to if she is to resume eliquis.Message will be sent toOncologist  Currently on daily nitrofurantoin by urology , has cystoscopy planned Discharge summary, and laboratory and radiology data are reviewed, and any questions or concerns  are discussed. Specific issues requiring follow up are specifically addressed.   At high risk for falls per [name] [name] fall risk assessment scale Home safety reviewed with pt and caregiver, no falls reported.   Essential hypertension Controlled, no change in medication   Bilateral lower extremity edema Markedly improved  Hyperlipidemia LDL goal <100 Hyperlipidemia:Low fat diet discussed and encouraged.   Lipid Panel  Lab Results  Component Value Date   CHOL 235 (H) 05/30/2020   HDL 110 05/30/2020   LDLCALC 107 (H) 05/30/2020   TRIG 92 05/30/2020   CHOLHDL 2.1 05/30/2020       Weakness generalized improved  Zoster Improved but still experiencing burning pain

## 2020-10-16 NOTE — Assessment & Plan Note (Signed)
Patient in for follow up of recent ED evaluation on 7/21/20022 for BRRB on eliquis and prednisone 20 mg with diverticulosis. No visible bleeding since stoppingelpiquis, waiting on decision of heme/ onc as to if she is to resume eliquis.Message will be sent toOncologist  Currently on daily nitrofurantoin by urology , has cystoscopy planned Discharge summary, and laboratory and radiology data are reviewed, and any questions or concerns  are discussed. Specific issues requiring follow up are specifically addressed.

## 2020-10-16 NOTE — Assessment & Plan Note (Signed)
Markedly improved

## 2020-10-16 NOTE — Assessment & Plan Note (Signed)
Hyperlipidemia:Low fat diet discussed and encouraged.   Lipid Panel  Lab Results  Component Value Date   CHOL 235 (H) 05/30/2020   HDL 110 05/30/2020   LDLCALC 107 (H) 05/30/2020   TRIG 92 05/30/2020   CHOLHDL 2.1 05/30/2020

## 2020-10-16 NOTE — Assessment & Plan Note (Signed)
Improved but still experiencing burning pain

## 2020-10-16 NOTE — Assessment & Plan Note (Signed)
Controlled, no change in medication  

## 2020-10-16 NOTE — Assessment & Plan Note (Signed)
Home safety reviewed with pt and caregiver, no falls reported.

## 2020-10-17 ENCOUNTER — Telehealth (HOSPITAL_COMMUNITY): Payer: Self-pay | Admitting: Physician Assistant

## 2020-10-17 ENCOUNTER — Telehealth: Payer: Self-pay

## 2020-10-17 NOTE — Telephone Encounter (Signed)
I called this afternoon and connected with [name] via telephone to follow-up on reported episode of GI bleeding last week.  She had called our nurse on 10/10/2020 due to reports of "maroon-colored stools."  Patient reports that she had bowel movements that were "full of blood."  Patient was instructed at that time to stop her Eliquis.  She was evaluated briefly in the ED on 10/10/2020, and discharged home.  Patient reports that her bloody bowel movements resolved after Eliquis was held.  We repeated a CBC on 10/14/2020, which showed stable Hgb 11.7.  She has not had any bloody bowel movements for the past 5 days.  Due to her multiple recurrent DVTs, I have instructed her to restart her Eliquis at this time.  She has been instructed to HOLD her Eliquis and present to the emergency department if she has any further bloody bowel movements.

## 2020-10-17 NOTE — Telephone Encounter (Signed)
It was not from [place]

## 2020-10-17 NOTE — Telephone Encounter (Signed)
Sherry from lab called she received 3 cards of stool samples but no orders were entered. Call back # [phone].

## 2020-10-22 ENCOUNTER — Ambulatory Visit: Payer: Medicare HMO | Admitting: Neurology

## 2020-10-22 ENCOUNTER — Encounter: Payer: Self-pay | Admitting: Neurology

## 2020-10-22 VITALS — BP 126/74 | HR 74 | Ht 65.0 in

## 2020-10-22 DIAGNOSIS — G7249 Other inflammatory and immune myopathies, not elsewhere classified: Secondary | ICD-10-CM | POA: Diagnosis not present

## 2020-10-22 NOTE — Progress Notes (Signed)
Reason for visit: Inflammatory myositis  [name] [name] is an 87 y.o. female  History of present illness:  [name] [name] is an 87 year old right-handed black female with a history of inflammatory myositis.  The patient is on methotrexate, she continues to have weakness of the arms and legs but this has remained stable over time.  The patient is able to stand and take a few steps with a walker at times, but mainly she is restricted to a wheelchair.  She requires some assistance with bathing and dressing.  She has not had any falls.  She is eating and swallowing fairly well.  She recently has been evaluated for some rectal bleeding which has stopped more recently.  The patient has had recent blood work done that includes a CBC and chemistry profile.  Past Medical History:  Diagnosis Date   Acute deep vein thrombosis (DVT) of left femoral vein ([place]) 02/07/2019   Dx 02/05/2019   Arthritis    spinal stenosis   Deep vein thrombosis (DVT) of left lower extremity ([place]) 08/04/2019   Elevated LFTs    secondary to fatty liver, negative work-up in 2011   Hypercholesterolemia    Hypertension    Lumbar spinal stenosis 01/14/2018   L3-4 and L4-5   Myopathy 02/15/2018   Osteoarthritis    right knee   Peripheral neuropathy 07/06/2018   PONV (postoperative nausea and vomiting)    Uterine cancer ([place]) 08/2006   grade 1, no recurrence up to 2013    Past Surgical History:  Procedure Laterality Date   ABDOMINAL HYSTERECTOMY  2008   adenocarcinoma stage 1   APPENDECTOMY  1973   BREAST BIOPSY Left 2018   Benign   CHOLECYSTECTOMY  1973   COLONOSCOPY    06/21/2007   GUR:KYHCWC rectum/Sigmoid diverticula, diminutive hepatic flexure polyp s/p bx. Benign.    COLONOSCOPY N/A 10/17/2012   Dr. Oneida Alar- moderate diverticulosis was noted in the sigmoid colon, moderate sized internal hemorrhoids. next tcs in10 years   ESOPHAGOGASTRODUODENOSCOPY N/A 03/20/2016   Dr. Oneida Alar, widely patent Schatzki ring, anemia due  to ASA induced erosive gastritis, mild duodenitis. gastric bx benign without H.pylori.    FOOT SURGERY  2007   Pins in toes on left foot, 5 to 6 yrs ago   [facility] N/A 03/20/2016   Procedure: GIVENS CAPSULE STUDY;  Surgeon: Danie Binder, MD;  Location: [facility];  Service: Endoscopy;  Laterality: N/A;   MUSCLE BIOPSY Left 06/07/2018   Procedure: LEFT THIGH MUSCLE BIOPSY;  Surgeon: Erroll Luna, MD;  Location: [facility];  Service: General;  Laterality: Left;   TONSILLECTOMY      Family History  Problem Relation Age of Onset   Heart disease Father    Bladder Cancer Brother        in remission    Hypertension Sister    Dementia Sister    Diabetes type II Sister    Hypertension Sister    Aneurysm Sister    Hypertension Brother    Arthritis Brother    Hypertension Son    Colon cancer Neg Hx     Social history:  reports that she has never smoked. She has never used smokeless tobacco. She reports that she does not drink alcohol and does not use drugs.    Allergies  Allergen Reactions   Cellcept [Mycophenolate Mofetil]     Muscle cramps, mouth soreness, difficulty swallowing, bruising   Statins     Dx of in  Ciprofloxacin Hcl Other (See Comments)    Chills, sick, could not tolerate it   Hydrocodone Nausea Only   Penicillins Rash    Medications:  Prior to Admission medications   Medication Sig Start Date End Date Taking? Authorizing Provider  acetaminophen (TYLENOL) 650 MG CR tablet Take 650 mg by mouth every 6 (six) hours.  08/08/19  Yes [provider]  apixaban (ELIQUIS) 5 MG TABS tablet Take 1 tablet (5 mg total) by mouth 2 (two) times daily. 09/25/20  Yes Derek Jack, MD  doxycycline (VIBRAMYCIN) 100 MG capsule Take nightly until your scheduled office visit with Dr. Jeffie Pollock 09/30/20  Yes Irine Seal, MD  ergocalciferol (VITAMIN D2) 1.25 MG (50000 UT) capsule Take 1 capsule (50,000 Units total) by mouth once a week. 10/07/20   Yes Pennington, Rebekah M, PA-C  ezetimibe (ZETIA) 10 MG tablet TAKE 1 TABLET EVERY DAY 04/15/20  Yes Perlie Mayo, NP  folic acid (FOLVITE) 1 MG tablet TAKE 1 TABLET EVERY DAY 08/06/20  Yes Derek Jack, MD  furosemide (LASIX) 20 MG tablet Take 1 tablet (20 mg total) by mouth daily. 02/19/20  Yes Fayrene Helper, MD  gabapentin (NEURONTIN) 100 MG capsule Take one capsule by mouth every morning and take two capsules at bedtime 06/12/20  Yes Fayrene Helper, MD  lidocaine (LIDODERM) 5 % Place 3 patches onto the skin daily. Remove & Discard patch within 12 hours or as directed by MD 05/28/20  Yes Fayrene Helper, MD  magnesium oxide (MAG-OX) 400 (241.3 Mg) MG tablet TAKE 1 TABLET TWICE DAILY 06/18/20  Yes Derek Jack, MD  methotrexate (RHEUMATREX) 2.5 MG tablet Take 5 tablets (12.5 mg total) by mouth once a week. Caution:Chemotherapy. Protect from light. 06/18/20  Yes Kathrynn Ducking, MD  NON FORMULARY Fluid restriction of 1.8 liters per day (1730m total) 7-3 = 800 ml 3-11 = 800 ml 11-7 = 175 ml Document totals qshift. Every Shift Day, Evening, Nigh 08/07/19  Yes [provider]  NON FORMULARY Diet: _____ Regular, __x____ NAS, _______Consistent Carbohydrate, _______NPO _____Other   Yes [provider]  predniSONE (DELTASONE) 5 MG tablet Take 2 tablets (10 mg total) by mouth daily with breakfast. 06/26/20  Yes WKathrynn Ducking MD  triamterene-hydrochlorothiazide ([id number]-85 37.5-25 MG tablet TAKE 1 TABLET EVERY DAY (DOSE REDUCTION) 09/16/20  Yes SFayrene Helper MD    ROS:  Out of a complete 14 system review of symptoms, the patient complains only of the following symptoms, and all other reviewed systems are negative.  Weakness Rectal bleeding Walking difficulty  Blood pressure 126/74, pulse 74, height 5' 5"  (1.651 m).  Physical Exam  General: The patient is alert and cooperative at the time of the examination.  Skin: 1-2+ edema  below the knees is seen bilaterally.   Neurologic Exam  Mental status: The patient is alert and oriented x 3 at the time of the examination. The patient has apparent normal recent and remote memory, with an apparently normal attention span and concentration ability.   Cranial nerves: Facial symmetry is present. Speech is normal, no aphasia or dysarthria is noted. Extraocular movements are full. Visual fields are full.  Motor: The patient has good strength in all 4 extremities, with exception of 3/5 strength in the deltoid muscles bilaterally and 4/5 strength with hip flexion bilaterally and 3/5 strength with abductor strength bilaterally of the thighs.  The patient has some mild weakness of intrinsic muscles of the left hand.  Sensory examination: Soft touch sensation  is symmetric on the face, arms, and legs.  Coordination: The patient has good finger-nose-finger and heel-to-shin bilaterally.  Gait and station: The patient is able to pull her self up to stand, not able to ambulate.  Reflexes: Deep tendon reflexes are symmetric.   Assessment/Plan:  1.  Inflammatory myositis  2.  Probable left ulnar neuropathy at the elbow  The patient recently has noted some numbness in the fourth and fifth fingers of the left hand, she may be compressing the left ulnar nerve with prolonged sitting in a wheelchair, putting pressure on the left elbow.  She will need to be cognizant of this and alleviate pressure on the elbow.  The patient remains on methotrexate taking 12.5 mg once a week, she has remained stable with her muscle strength but has not improved.  We will check blood today to check a CK enzyme level.  She will follow-up in 4 months.  In the future, she can be seen through Dr. Krista Blue.  Jill Alexanders MD 10/22/2020 11:39 AM  [facility] [address] [place], [place] [address]  Phone [phone] Fax [phone]

## 2020-10-23 ENCOUNTER — Telehealth: Payer: Self-pay | Admitting: Neurology

## 2020-10-23 ENCOUNTER — Encounter: Payer: Self-pay | Admitting: Neurology

## 2020-10-23 DIAGNOSIS — G7249 Other inflammatory and immune myopathies, not elsewhere classified: Secondary | ICD-10-CM

## 2020-10-23 LAB — CK: Total CK: 174 U/L — ABNORMAL HIGH (ref 26–161)

## 2020-10-23 MED ORDER — METHOTREXATE 2.5 MG PO TABS: 15.0000 mg | ORAL_TABLET | ORAL | 1 refills | Status: DC

## 2020-10-23 NOTE — Telephone Encounter (Signed)
I called the patient.  The muscle enzyme levels are still slightly above normal range, I will go up on the methotrexate taking 15 mg once a week.  A new prescription will be sent in.  The patient is on folic acid.

## 2020-10-30 ENCOUNTER — Other Ambulatory Visit (HOSPITAL_COMMUNITY): Payer: Self-pay | Admitting: Hematology

## 2020-11-07 ENCOUNTER — Other Ambulatory Visit: Payer: Self-pay

## 2020-11-07 ENCOUNTER — Ambulatory Visit (INDEPENDENT_AMBULATORY_CARE_PROVIDER_SITE_OTHER): Payer: Medicare HMO | Admitting: Urology

## 2020-11-07 ENCOUNTER — Encounter: Payer: Self-pay | Admitting: Urology

## 2020-11-07 VITALS — BP 120/73 | HR 86

## 2020-11-07 DIAGNOSIS — N329 Bladder disorder, unspecified: Secondary | ICD-10-CM

## 2020-11-07 DIAGNOSIS — N3020 Other chronic cystitis without hematuria: Secondary | ICD-10-CM | POA: Diagnosis not present

## 2020-11-07 DIAGNOSIS — N3941 Urge incontinence: Secondary | ICD-10-CM

## 2020-11-07 LAB — URINALYSIS, ROUTINE W REFLEX MICROSCOPIC
Bilirubin, UA: NEGATIVE
Glucose, UA: NEGATIVE
Ketones, UA: NEGATIVE
Leukocytes,UA: NEGATIVE
Nitrite, UA: NEGATIVE
Protein,UA: NEGATIVE
RBC, UA: NEGATIVE
Specific Gravity, UA: 1.02 (ref 1.005–1.030)
Urobilinogen, Ur: 0.2 mg/dL (ref 0.2–1.0)
pH, UA: 6.5 (ref 5.0–7.5)

## 2020-11-07 LAB — MICROSCOPIC EXAMINATION
RBC, Urine: NONE SEEN /hpf (ref 0–2)
WBC, UA: NONE SEEN /hpf (ref 0–5)

## 2020-11-07 MED ORDER — GEMTESA 75 MG PO TABS: 75.0000 mg | ORAL_TABLET | Freq: Every day | ORAL | 0 refills | Status: DC

## 2020-11-07 NOTE — Progress Notes (Signed)
Subjective:  1. Lesion of bladder   2. Urge incontinence of urine   3. Chronic cystitis      [name] [name] returns today for history of UUI.   She had e. Coli on culture and was treated with doxycycline on 07/18/20 for a week. Cystoscopy was cancelled at her last visit for a possible UTI and she is now on suppressive doxycycline.   He has stable UUI but no hematuria.  She returns for cystoscopy to assess the bladder findings from a CT [facility] in 12/20 that showed some asymmetric bladder wall thickening on the left and onto the trigone.  UA today has no WBC or RBC but there are bacteria and yeast.  Last Cx was Mx species.  ROS:  ROS:  A complete review of systems was performed.  All systems are negative except for pertinent findings as noted.   ROS  Allergies  Allergen Reactions   Cellcept [Mycophenolate Mofetil]     Muscle cramps, mouth soreness, difficulty swallowing, bruising   Statins     Dx of in   Ciprofloxacin Hcl Other (See Comments)    Chills, sick, could not tolerate it   Hydrocodone Nausea Only   Penicillins Rash    Outpatient Encounter Medications as of 11/07/2020  Medication Sig Note   acetaminophen (TYLENOL) 650 MG CR tablet Take 650 mg by mouth every 6 (six) hours.     apixaban (ELIQUIS) 5 MG TABS tablet Take 1 tablet (5 mg total) by mouth 2 (two) times daily.    doxycycline (VIBRAMYCIN) 100 MG capsule Take nightly until your scheduled office visit with Dr. Jeffie [name]    ergocalciferol (VITAMIN D2) 1.25 MG (50000 UT) capsule Take 1 capsule (50,000 Units total) by mouth once a week.    ezetimibe (ZETIA) 10 MG tablet TAKE 1 TABLET EVERY DAY    folic acid (FOLVITE) 1 MG tablet TAKE 1 TABLET EVERY DAY    furosemide (LASIX) 20 MG tablet Take 1 tablet (20 mg total) by mouth daily.    gabapentin (NEURONTIN) 100 MG capsule Take one capsule by mouth every morning and take two capsules at bedtime 07/03/2020: Two tabs tid    lidocaine (LIDODERM) 5 % Place 3 patches onto the skin daily. Remove &  Discard patch within 12 hours or as directed by MD    magnesium oxide (MAG-OX) 400 (240 Mg) MG tablet TAKE 1 TABLET TWICE DAILY    methotrexate (RHEUMATREX) 2.5 MG tablet Take 6 tablets (15 mg total) by mouth once a week. Caution:Chemotherapy. Protect from light.    NON FORMULARY Fluid restriction of 1.8 liters per day (1741m total) 7-3 = 800 ml 3-11 = 800 ml 11-7 = 175 ml Document totals qshift. Every Shift Day, Evening, Nigh    NON FORMULARY Diet: _____ Regular, __x____ NAS, _______Consistent Carbohydrate, _______NPO _____Other    predniSONE (DELTASONE) 5 MG tablet Take 2 tablets (10 mg total) by mouth daily with breakfast.    triamterene-hydrochlorothiazide (MAXZIDE-25) 37.5-25 MG tablet TAKE 1 TABLET EVERY DAY (DOSE REDUCTION)    Vibegron (GEMTESA) 75 MG TABS Take 75 mg by mouth daily.    No facility-administered encounter medications on file as of 11/07/2020.    Past Medical History:  Diagnosis Date   Acute deep vein thrombosis (DVT) of left femoral vein ([facility] 02/07/2019   Dx 02/05/2019   Arthritis    spinal stenosis   Deep [place] thrombosis (DVT) of left lower extremity ([facility] [place] 08/04/2019   Elevated LFTs    secondary to fatty liver, negative  work-up in 2011   Hypercholesterolemia    Hypertension    Lumbar spinal stenosis 01/14/2018   L3-4 and L4-5   Myopathy 02/15/2018   Osteoarthritis    right knee   Peripheral neuropathy 07/06/2018   PONV (postoperative nausea and vomiting)    Uterine cancer ([place]) 08/2006   grade 1, no recurrence up to 2013    Past Surgical History:  Procedure Laterality Date   ABDOMINAL HYSTERECTOMY  2008   adenocarcinoma stage 1   APPENDECTOMY  1973   BREAST BIOPSY Left 2018   Benign   CHOLECYSTECTOMY  1973   COLONOSCOPY    06/21/2007   JZP:HXTAVW rectum/Sigmoid diverticula, diminutive hepatic flexure polyp s/p bx. Benign.    COLONOSCOPY N/A 10/17/2012   Dr. Oneida Alar- moderate diverticulosis was noted in the sigmoid colon, moderate sized  internal hemorrhoids. next tcs in10 years   ESOPHAGOGASTRODUODENOSCOPY N/A 03/20/2016   Dr. Oneida Alar, widely patent Schatzki ring, anemia due to ASA induced erosive gastritis, mild duodenitis. gastric bx benign without H.pylori.    FOOT SURGERY  2007   Pins in toes on left foot, 5 to 6 yrs ago   [place] N/A 03/20/2016   Procedure: GIVENS CAPSULE STUDY;  Surgeon: Danie Binder, MD;  Location[facility];  Service: Endoscopy;  Laterality: N/A;   MUSCLE BIOPSY Left 06/07/2018   Procedure: LEFT THIGH MUSCLE BIOPSY;  Surgeon: Erroll Luna, MD;  Location: [facility];  Service: General;  Laterality: Left;   TONSILLECTOMY      Social History   Socioeconomic History   Marital status: Widowed    Spouse name: [name] [name]    Number of children: 3   Years of education: 12   Highest education level: 12th grade  Occupational History   Occupation: retired   Tobacco Use   Smoking status: Never   Smokeless tobacco: Never   Tobacco comments:    Never smoked  Vaping Use   Vaping Use: Never used  Substance and Sexual Activity   Alcohol use: No   Drug use: No   Sexual activity: Not Currently    Birth control/protection: Post-menopausal, Surgical  Other Topics Concern   Not on file  Social History Narrative   Husband [name] [name] recently passed away   Caffeine use: soda daily   Right handed    Social Determinants of Health   Financial Resource Strain: Low Risk    Difficulty of Paying Living Expenses: Not very hard  Food Insecurity: No Food Insecurity   Worried About Charity fundraiser in the Last Year: Never true   Ran Out of Food in the Last Year: Never true  Transportation Needs: No Transportation Needs   Lack of Transportation (Medical): No   Lack of Transportation (Non-Medical): No  Physical Activity: Inactive   Days of Exercise per Week: 0 days   Minutes of Exercise per Session: 0 min  Stress: Stress Concern Present   Feeling of Stress : To some extent   Social Connections: Socially Isolated   Frequency of Communication with Friends and Family: More than three times a week   Frequency of Social Gatherings with Friends and Family: Once a week   Attends Religious Services: Never   Marine scientist or Organizations: No   Attends Archivist Meetings: Never   Marital Status: Widowed  Intimate Partner Violence: Not At Risk   Fear of Current or Ex-Partner: No   Emotionally Abused: No   Physically Abused: No   Sexually  Abused: No    Family History  Problem Relation Age of Onset   Heart disease Father    Bladder Cancer Brother        in remission    Hypertension Sister    Dementia Sister    Diabetes type II Sister    Hypertension Sister    Aneurysm Sister    Hypertension Brother    Arthritis Brother    Hypertension Son    Colon cancer Neg Hx        Objective: Vitals:   11/07/20 1038  BP: 120/73  Pulse: 86     Physical Exam  Lab Results:  No results found for this or any previous visit (from the past 24 hour(s)).  Recent Results (from the past 2160 hour(s))  CBC with Differential/Platelet     Status: Abnormal   Collection Time: 09/30/20 11:05 AM  Result Value Ref Range   WBC 8.0 4.0 - 10.5 K/uL   RBC 4.02 3.87 - 5.11 MIL/uL   Hemoglobin 12.9 12.0 - 15.0 g/dL   HCT 40.4 36.0 - 46.0 %   MCV 100.5 (H) 80.0 - 100.0 fL   MCH 32.1 26.0 - 34.0 pg   MCHC 31.9 30.0 - 36.0 g/dL   RDW 12.3 11.5 - 15.5 %   Platelets 193 150 - 400 K/uL   nRBC 0.0 0.0 - 0.2 %   Neutrophils Relative % 76 %   Neutro Abs 6.2 1.7 - 7.7 K/uL   Lymphocytes Relative 14 %   Lymphs Abs 1.1 0.7 - 4.0 K/uL   Monocytes Relative 6 %   Monocytes Absolute 0.5 0.1 - 1.0 K/uL   Eosinophils Relative 3 %   Eosinophils Absolute 0.2 0.0 - 0.5 K/uL   Basophils Relative 0 %   Basophils Absolute 0.0 0.0 - 0.1 K/uL   Immature Granulocytes 1 %   Abs Immature Granulocytes 0.04 0.00 - 0.07 K/uL    Comment: Performed at [facility], [address] [place], [place] [address]  Comprehensive metabolic panel     Status: Abnormal   Collection Time: 09/30/20 11:05 AM  Result Value Ref Range   Sodium 139 135 - 145 mmol/L   Potassium 3.7 3.5 - 5.1 mmol/L   Chloride 102 98 - 111 mmol/L   CO2 29 22 - 32 mmol/L   Glucose, Bld 106 (H) 70 - 99 mg/dL    Comment: Glucose reference range applies only to samples taken after fasting for at least 8 hours.   BUN 27 (H) 8 - 23 mg/dL   Creatinine, Ser 0.82 0.44 - 1.00 mg/dL   Calcium 8.8 (L) 8.9 - 10.3 mg/dL   Total Protein 6.2 (L) 6.5 - 8.1 g/dL   Albumin 3.4 (L) 3.5 - 5.0 g/dL   AST 28 15 - 41 U/L   ALT 22 0 - 44 U/L   Alkaline Phosphatase 40 38 - 126 U/L   Total Bilirubin 0.8 0.3 - 1.2 mg/dL   GFR, Estimated >60 >60 mL/min    Comment: (NOTE) Calculated using the CKD-EPI Creatinine Equation (2021)    Anion gap 8 5 - 15    Comment: Performed at [facility], [address] [place], [place] [address]  VITAMIN D 25 Hydroxy (Vit-D Deficiency, Fractures)     Status: Abnormal   Collection Time: 09/30/20 11:05 AM  Result Value Ref Range   Vit D, 25-Hydroxy 28.69 (L) 30 - 100 ng/mL    Comment: (NOTE) Vitamin D deficiency has been defined by the  [place] practice guideline as a level of serum 25-OH  vitamin D less than 20 ng/mL (1,2). The Endocrine Society went on to  further define vitamin D insufficiency as a level between 21 and 29  ng/mL (2).  1. IOM (Institute of Medicine). 2010. Dietary reference intakes for  calcium and D. [place]: The [employer]. 2. Holick MF, Binkley Pillsbury, Bischoff-Ferrari HA, et al. Evaluation,  treatment, and prevention of vitamin D deficiency: an Endocrine  Society clinical practice guideline, JCEM. 2011 Jul; 96(7): 1911-30.  Performed at [facility], [place] [address], [place], [place] [address]   Magnesium     Status: None   Collection Time: 09/30/20 11:05 AM  Result Value Ref Range   Magnesium  2.0 1.7 - 2.4 mg/dL    Comment: Performed at [facility], [address] [place], [place] [address]  D-dimer, quantitative     Status: None   Collection Time: 09/30/20 11:05 AM  Result Value Ref Range   D-Dimer, Quant 0.41 0.00 - 0.50 ug/mL-FEU    Comment: (NOTE) At the manufacturer cut-off value of 0.5 g/mL FEU, this assay has a negative predictive value of 95-100%.This assay is intended for use in conjunction with a clinical pretest probability (PTP) assessment model to exclude pulmonary embolism (PE) and deep venous thrombosis (DVT) in outpatients suspected of PE or DVT. Results should be correlated with clinical presentation. Performed at [facility], [address] [place], [place] [address]   Iron and TIBC     Status: None   Collection Time: 09/30/20 11:06 AM  Result Value Ref Range   Iron 71 28 - 170 ug/dL   TIBC 298 250 - 450 ug/dL   Saturation Ratios 24 10.4 - 31.8 %   UIBC 227 ug/dL    Comment: Performed at [facility], [address], [place], [place] [address]  Ferritin     Status: None   Collection Time: 09/30/20 11:06 AM  Result Value Ref Range   Ferritin 138 11 - 307 ng/mL    Comment: Performed at [facility], [address] [place], [place] [address]  Comprehensive metabolic panel     Status: Abnormal   Collection Time: 10/10/20 10:59 AM  Result Value Ref Range   Sodium 141 135 - 145 mmol/L   Potassium 3.3 (L) 3.5 - 5.1 mmol/L   Chloride 103 98 - 111 mmol/L   CO2 26 22 - 32 mmol/L   Glucose, Bld 94 70 - 99 mg/dL    Comment: Glucose reference range applies only to samples taken after fasting for at least 8 hours.   BUN 34 (H) 8 - 23 mg/dL   Creatinine, Ser 0.82 0.44 - 1.00 mg/dL   Calcium 9.2 8.9 - 10.3 mg/dL   Total Protein 6.1 (L) 6.5 - 8.1 g/dL   Albumin 3.5 3.5 - 5.0 g/dL   AST 30 15 - 41 U/L   ALT 23 0 - 44 U/L   Alkaline Phosphatase 37 (L) 38 - 126 U/L   Total Bilirubin 0.9 0.3 - 1.2 mg/dL   GFR, Estimated >60 >60 mL/min    Comment:  (NOTE) Calculated using the CKD-EPI Creatinine Equation (2021)    Anion gap 12 5 - 15    Comment: Performed at [facility], [address] [place], [place] [address]  CBC     Status: Abnormal   Collection Time: 10/10/20 10:59 AM  Result Value Ref Range   WBC 7.5  4.0 - 10.5 K/uL   RBC 3.84 (L) 3.87 - 5.11 MIL/uL   Hemoglobin 12.6 12.0 - 15.0 g/dL   HCT 39.6 36.0 - 46.0 %   MCV 103.1 (H) 80.0 - 100.0 fL   MCH 32.8 26.0 - 34.0 pg   MCHC 31.8 30.0 - 36.0 g/dL   RDW 12.7 11.5 - 15.5 %   Platelets 188 150 - 400 K/uL   nRBC 0.0 0.0 - 0.2 %    Comment: Performed at [facility], [address] [place], [place] [address]  Type and screen [facility]     Status: None   Collection Time: 10/10/20 10:59 AM  Result Value Ref Range   ABO/RH(D) B POS    Antibody Screen NEG    Sample Expiration      10/13/2020,2359 Performed at [facility], [address] [place], [place] [address]   Protime-INR - (order if Patient is taking Coumadin / Warfarin)     Status: Abnormal   Collection Time: 10/10/20 10:59 AM  Result Value Ref Range   Prothrombin Time 15.9 (H) 11.4 - 15.2 seconds   INR 1.3 (H) 0.8 - 1.2    Comment: (NOTE) INR goal varies based on device and disease states. Performed at [facility], [address] [place], [place] [address]   CBC with Differential/Platelet     Status: Abnormal   Collection Time: 10/14/20 11:19 AM  Result Value Ref Range   WBC 7.9 4.0 - 10.5 K/uL   RBC 3.63 (L) 3.87 - 5.11 MIL/uL   Hemoglobin 11.7 (L) 12.0 - 15.0 g/dL   HCT 36.6 36.0 - 46.0 %   MCV 100.8 (H) 80.0 - 100.0 fL   MCH 32.2 26.0 - 34.0 pg   MCHC 32.0 30.0 - 36.0 g/dL   RDW 12.5 11.5 - 15.5 %   Platelets 198 150 - 400 K/uL   nRBC 0.0 0.0 - 0.2 %   Neutrophils Relative % 71 %   Neutro Abs 5.7 1.7 - 7.7 K/uL   Lymphocytes Relative 17 %   Lymphs Abs 1.4 0.7 - 4.0 K/uL   Monocytes Relative 9 %   Monocytes Absolute 0.7 0.1 - 1.0 K/uL   Eosinophils Relative 1 %   Eosinophils Absolute 0.1  0.0 - 0.5 K/uL   Basophils Relative 1 %   Basophils Absolute 0.0 0.0 - 0.1 K/uL   Immature Granulocytes 1 %   Abs Immature Granulocytes 0.04 0.00 - 0.07 K/uL    Comment: Performed at [facility], [address] [place], [place] [address]  Urinalysis, Routine w reflex microscopic     Status: None   Collection Time: 10/14/20  1:08 PM  Result Value Ref Range   Specific Gravity, UA 1.020 1.005 - 1.030   pH, UA 6.5 5.0 - 7.5   Color, UA Yellow Yellow   Appearance Ur Clear Clear   Leukocytes,UA Negative Negative   Protein,UA Negative Negative/Trace   Glucose, UA Negative Negative   Ketones, UA Negative Negative   RBC, UA Negative Negative   Bilirubin, UA Negative Negative   Urobilinogen, Ur 0.2 0.2 - 1.0 mg/dL   Nitrite, UA Negative Negative   Microscopic Examination Comment     Comment: Microscopic follows if indicated.  CULTURE, URINE COMPREHENSIVE     Status: None   Collection Time: 10/14/20  1:50 PM   UR  Result Value Ref Range   Urine Culture, Comprehensive Final report    Organism ID, Bacteria Comment     Comment:  Viridans streptococcus group Greater than 100,000 colony forming units per mL Susceptibility not normally performed on this organism.    Organism ID, Bacteria Comment     Comment: Mixed urogenital flora 10,000-25,000 colony forming units per mL   CK     Status: Abnormal   Collection Time: 10/22/20 12:03 PM  Result Value Ref Range   Total CK 174 (H) 26 - 161 U/L    UA reviewed with results noted above.  Report entry not complete.    Studies/Results:  CLINICAL DATA:  History of uterine cancer status post hysterectomy. Recent occlusive left lower extremity DVT.   EXAM: CT ABDOMEN AND PELVIS WITH CONTRAST   TECHNIQUE: Multidetector CT imaging of the abdomen and pelvis was performed using the standard protocol following bolus administration of intravenous contrast.   CONTRAST:  178m OMNIPAQUE IOHEXOL 300 MG/ML  SOLN   COMPARISON:  01/13/2011    FINDINGS: Lower chest: Scarring/atelectasis in the medial left upper lobe.   Hepatobiliary: 13 mm probable cyst in the posterior right hepatic dome.   Status post cholecystectomy. No intrahepatic or extrahepatic ductal dilatation.   Pancreas: Within normal limits.   Spleen: Within normal limits.   Adrenals/Urinary Tract: Adrenal glands are within normal limits.   Subcentimeter bilateral renal cysts.  No hydronephrosis.   Bladder is notable for mild mucosal hyperenhancement (series 3/image 83).   Stomach/Bowel: Stomach is within normal limits.   No evidence of bowel obstruction.   Appendix is not discretely visualized.   Left colonic diverticulosis, without evidence of diverticulitis.   Vascular/Lymphatic: No evidence of abdominal aortic aneurysm.   Atherosclerotic calcifications of the abdominal aorta and branch vessels.   No suspicious abdominopelvic lymphadenopathy.   Reproductive: Status post hysterectomy.   No adnexal masses.   Other: No abdominopelvic ascites.   Musculoskeletal: Mild degenerative changes of the lower thoracic spine.   IMPRESSION: Status post hysterectomy. No findings suspicious for recurrent or metastatic disease.   Mild mucosal enhancement along the left posterolateral bladder wall. Consider cystoscopy for further evaluation, as clinically warranted.   Additional ancillary findings as above.     Electronically Signed   By: SJulian HyM.D.   On: 03/06/2019 18:16   11/07/20: Procedure.  Flexible cystoscopy.  She was frog legged and the genitalia was prepped with betadine.   The flexible scope was passed and about 733mof slightly turbid urine was aspirated and sent for culture.   The urethra is normal.  The bladder wall has moderate trabeculation with mild mucosal erythema most consistent with chornic inflammation.   No tumors or stones were noted.  The UO's were normal.   Assessment & Plan: Chronic cystitis with some bladder  wall inflammation.   She will stay on doxycyline for suppression pending the culture.  Urge incontinence. This hasn't improved with the antibiotics.  I will give her samples of Gemtesa to try.  Side effects reviewed.  Bladder wall lesion.  There was nothing worrisome on cystoscopy.  She has trabeculation which is the probable source of the bladder wall thickening.   Meds ordered this encounter  Medications   Vibegron (GEMTESA) 75 MG TABS    Sig: Take 75 mg by mouth daily.    Dispense:  42 tablet    Refill:  0      Orders Placed This Encounter  Procedures   Urine Culture   Urinalysis, Routine w reflex microscopic      Return in about 6 weeks (around 12/19/2020) for PVR on return.  CC: Fayrene Helper, MD      Irine Seal 11/07/2020 Patient ID: [name] [name], female   DOB: 1932-09-13, 87 y.o.   MRN: [id number] Patient ID: [name] [name], female   DOB: 01-Oct-1932, 87 y.o.   MRN: [id number]

## 2020-11-07 NOTE — Progress Notes (Signed)

## 2020-11-10 LAB — URINE CULTURE

## 2020-11-11 ENCOUNTER — Telehealth: Payer: Self-pay

## 2020-11-11 NOTE — Telephone Encounter (Signed)
Patient returned call and states she can make it Friday morning for cath specimen. Patient placed on for [place] schedule.

## 2020-11-11 NOTE — Telephone Encounter (Signed)
-----   Message from Irine Seal, MD sent at 11/11/2020 10:43 AM EDT ----- That looks the same as her last culture and she had no WBC on the UA so it is probably a contaminant.  I think we would need a cath UA and culture to clarify what is going on.   Can we get her in for that. ----- Message ----- From: Iris Pert, LPN Sent: 1/00/3496  10:27 AM EDT To: Irine Seal, MD  Please review. Patient was on doxycycline nightly until f/u.

## 2020-11-11 NOTE — Telephone Encounter (Signed)
-----   Message from Irine Seal, MD sent at 11/11/2020 10:43 AM EDT ----- That looks the same as her last culture and she had no WBC on the UA so it is probably a contaminant.  I think we would need a cath UA and culture to clarify what is going on.   Can we get her in for that. ----- Message ----- From: Iris Pert, LPN Sent: 5/45/6256  10:27 AM EDT To: Irine Seal, MD  Please review. Patient was on doxycycline nightly until f/u.

## 2020-11-15 ENCOUNTER — Ambulatory Visit (INDEPENDENT_AMBULATORY_CARE_PROVIDER_SITE_OTHER): Payer: Medicare HMO

## 2020-11-15 ENCOUNTER — Other Ambulatory Visit: Payer: Self-pay

## 2020-11-15 DIAGNOSIS — N390 Urinary tract infection, site not specified: Secondary | ICD-10-CM | POA: Diagnosis not present

## 2020-11-15 LAB — URINALYSIS, ROUTINE W REFLEX MICROSCOPIC
Bilirubin, UA: NEGATIVE
Glucose, UA: NEGATIVE
Ketones, UA: NEGATIVE
Nitrite, UA: NEGATIVE
Protein,UA: NEGATIVE
RBC, UA: NEGATIVE
Specific Gravity, UA: 1.02 (ref 1.005–1.030)
Urobilinogen, Ur: 0.2 mg/dL (ref 0.2–1.0)
pH, UA: 7 (ref 5.0–7.5)

## 2020-11-15 LAB — MICROSCOPIC EXAMINATION
Epithelial Cells (non renal): NONE SEEN /hpf (ref 0–10)
RBC, Urine: NONE SEEN /hpf (ref 0–2)
Renal Epithel, UA: NONE SEEN /hpf

## 2020-11-15 NOTE — Progress Notes (Signed)
Pt is prepped for and in and out catherization. Patient was cleaned and prepped in a sterle fashion with betadine. A 14 fr catheter foley was inserted. Urine return was note 50 ml.  Performed by [facility] LPN  Urine sent for culture

## 2020-11-18 LAB — URINE CULTURE

## 2020-11-19 ENCOUNTER — Telehealth: Payer: Self-pay

## 2020-11-19 ENCOUNTER — Other Ambulatory Visit: Payer: Self-pay

## 2020-11-19 MED ORDER — CLINDAMYCIN HCL 150 MG PO CAPS: 150.0000 mg | ORAL_CAPSULE | Freq: Four times a day (QID) | ORAL | 0 refills | Status: DC

## 2020-11-19 NOTE — Progress Notes (Signed)
Clindamycin prescription corrected with [facility] pharmacy for 28 tablets with Richardson Landry at [place].

## 2020-11-19 NOTE — Telephone Encounter (Signed)
Prescription called into pharmacy. Instructions reviewed with patient.

## 2020-11-19 NOTE — Telephone Encounter (Signed)
-----   Message from Irine Seal, MD sent at 11/18/2020  1:14 PM EDT ----- She has strep viridans in the urine but has a PCN allergy.  She needs to hold the doxycycline and begin clindamycin 175m po q6hr #28.   ----- Message ----- From: LDorisann Frames RN Sent: 11/18/2020   8:01 AM EDT To: JIrine Seal MD  Cath urine culture

## 2020-11-20 ENCOUNTER — Ambulatory Visit (INDEPENDENT_AMBULATORY_CARE_PROVIDER_SITE_OTHER): Payer: Medicare HMO | Admitting: Family Medicine

## 2020-11-20 ENCOUNTER — Other Ambulatory Visit: Payer: Self-pay

## 2020-11-20 VITALS — BP 144/72 | HR 93 | Temp 99.0°F | Resp 18 | Ht 65.0 in | Wt 150.0 lb

## 2020-11-20 DIAGNOSIS — R600 Localized edema: Secondary | ICD-10-CM

## 2020-11-20 DIAGNOSIS — B0229 Other postherpetic nervous system involvement: Secondary | ICD-10-CM

## 2020-11-20 DIAGNOSIS — Z0000 Encounter for general adult medical examination without abnormal findings: Secondary | ICD-10-CM

## 2020-11-20 DIAGNOSIS — Z23 Encounter for immunization: Secondary | ICD-10-CM

## 2020-11-20 DIAGNOSIS — Z0001 Encounter for general adult medical examination with abnormal findings: Secondary | ICD-10-CM

## 2020-11-20 MED ORDER — ACYCLOVIR 800 MG PO TABS: 800.0000 mg | ORAL_TABLET | Freq: Three times a day (TID) | ORAL | 0 refills | Status: DC

## 2020-11-20 NOTE — Progress Notes (Signed)
    [name] [name]     MRN: [id number]      DOB: 27-Oct-1932  HPI: Patient is in for annual physical exam. C/o leg swelling and mild depression, but as no interest in either medication or therapy for this. Feels alone at times after losing her husband of many years several years ago Persistent lancinating pain in right cheek and neck 6 months post acute shingles Recent labs,  are reviewed. Immunization is reviewed , and  updated    PE: BP (!) 144/72   Pulse 93   Temp 99 F (37.2 C)   Resp 18   Ht 5' 5"  (1.651 m)   Wt 150 lb (68 kg)   SpO2 93%   BMI 24.96 kg/m   Pleasant  female, alert and oriented x 3, in no cardio-pulmonary distress. Afebrile. HEENT No facial trauma or asymetry. Sinuses non tender.  Extra occullar muscles intact.. External ears normal, . Neck:decreased ROM no adenopathy,JVD or thyromegaly.No bruits.  Chest: Clear to ascultation bilaterally.No crackles or wheezes. Non tender to palpation   Cardiovascular system; Heart sounds normal,  S1 and  S2 ,no S3.  No murmur, or thrill. Apical beat not displaced Peripheral pulses normal. Loer ext: one plus edema  Abdomen: Soft, non tender, . No guarding, tenderness or rebound.     Musculoskeletal exam: Markedly decreased  ROM of spine, hips , shoulders and knees.  deformity ,swelling and  crepitus noted.  muscle wasting , no atrophy.   Neurologic: Cranial nerves 2 to 12 intact. Grade 4 Power in lower ext. ,sensation  normal throughout. Wheelchair dependent, disturbance in gait when ambulates which is infrequent, No tremor.  Skin: Intact, hyperpigmentation of right jaw where she had shingles Psych; Normal mood and affect. Judgement and concentration normal, at times mildly tearful   Assessment & Plan:  Annual physical exam Annual exam as documented.  Immunization needs are specifically addressed at this visit.   HZV (herpes zoster virus) post herpetic neuralgia persistent pain 6 months post  infection, an additional 1 week course of acyclovir is prescribed  Bilateral lower extremity edema Lasix daily for next 3 days and leg elevation as much as possible, also limit sodium

## 2020-11-20 NOTE — Patient Instructions (Signed)
F/u in 3 months, call if you need me sooner  Flu vaccine today  Acyclovir is prescribed for 1 week  Please  take lasix one daily for the next 5 days  Thanks for choosing [facility], we consider it a privelige to serve you.

## 2020-11-21 ENCOUNTER — Encounter: Payer: Self-pay | Admitting: Family Medicine

## 2020-11-21 NOTE — Assessment & Plan Note (Signed)
Annual exam as documented.  Immunization needs are specifically addressed at this visit.

## 2020-11-21 NOTE — Assessment & Plan Note (Signed)
Lasix daily for next 3 days and leg elevation as much as possible, also limit sodium

## 2020-11-21 NOTE — Assessment & Plan Note (Signed)
persistent pain 6 months post infection, an additional 1 week course of acyclovir is prescribed

## 2020-12-12 ENCOUNTER — Other Ambulatory Visit: Payer: Medicare HMO | Admitting: Urology

## 2021-01-02 ENCOUNTER — Other Ambulatory Visit: Payer: Self-pay

## 2021-01-02 ENCOUNTER — Ambulatory Visit (INDEPENDENT_AMBULATORY_CARE_PROVIDER_SITE_OTHER): Payer: Medicare HMO | Admitting: Urology

## 2021-01-02 VITALS — BP 116/68 | HR 52 | Temp 98.6°F

## 2021-01-02 DIAGNOSIS — R300 Dysuria: Secondary | ICD-10-CM | POA: Diagnosis not present

## 2021-01-02 DIAGNOSIS — N3941 Urge incontinence: Secondary | ICD-10-CM | POA: Diagnosis not present

## 2021-01-02 DIAGNOSIS — N3020 Other chronic cystitis without hematuria: Secondary | ICD-10-CM | POA: Diagnosis not present

## 2021-01-02 DIAGNOSIS — N390 Urinary tract infection, site not specified: Secondary | ICD-10-CM

## 2021-01-02 MED ORDER — GABAPENTIN 100 MG PO CAPS: ORAL_CAPSULE | ORAL | 1 refills | Status: DC

## 2021-01-02 MED ORDER — EZETIMIBE 10 MG PO TABS: 10.0000 mg | ORAL_TABLET | Freq: Every day | ORAL | 1 refills | Status: DC

## 2021-01-02 NOTE — Progress Notes (Signed)
post void residual= 0 ml

## 2021-01-02 NOTE — Progress Notes (Signed)
Urological Symptom Review  Patient is experiencing the following symptoms: Frequent urination Leakage of urine Vaginal bleeding (female only)   Review of Systems  Gastrointestinal (upper)  : Negative for upper GI symptoms  Gastrointestinal (lower) : Negative for lower GI symptoms  Constitutional : Negative for symptoms  Skin: Negative for skin symptoms  Eyes: Negative for eye symptoms  Ear/Nose/Throat : Negative for Ear/Nose/Throat symptoms  Hematologic/Lymphatic: Negative for Hematologic/Lymphatic symptoms  Cardiovascular : Leg swelling  Respiratory : Negative for respiratory symptoms  Endocrine: Negative for endocrine symptoms  Musculoskeletal: Negative for musculoskeletal symptoms  Neurological: Negative for neurological symptoms  Psychologic: Negative for psychiatric symptoms

## 2021-01-02 NOTE — Progress Notes (Signed)
Subjective:  1. Urinary tract infection without hematuria, site unspecified   2. Urge incontinence of urine   3. Chronic cystitis   4. Dysuria      Bernell returns today for history of UUI.  She didn't benefit from Medulla and uses 6 ppd.   She had a strep on her last culture on doxycycline for suppression but she doesn't think she is still on that.   She has dysuria.  She has no hematuria but can see a little bit on the tissue.  Cystoscopy was unremarkable at her last visit. She uses a pullup at night and doesn't get up but has urgency with incontinence.       ROS:  ROS:  A complete review of systems was performed.  All systems are negative except for pertinent findings as noted.   ROS  Allergies  Allergen Reactions   Cellcept [Mycophenolate Mofetil]     Muscle cramps, mouth soreness, difficulty swallowing, bruising   Statins     Dx of in   Ciprofloxacin Hcl Other (See Comments)    Chills, sick, could not tolerate it   Hydrocodone Nausea Only   Penicillins Rash    Outpatient Encounter Medications as of 01/02/2021  Medication Sig Note   acetaminophen (TYLENOL) 650 MG CR tablet Take 650 mg by mouth every 6 (six) hours.     acyclovir (ZOVIRAX) 800 MG tablet Take 1 tablet (800 mg total) by mouth 3 (three) times daily.    apixaban (ELIQUIS) 5 MG TABS tablet Take 1 tablet (5 mg total) by mouth 2 (two) times daily.    clindamycin (CLEOCIN) 150 MG capsule Take 1 capsule (150 mg total) by mouth every 6 (six) hours.    ergocalciferol (VITAMIN D2) 1.25 MG (50000 UT) capsule Take 1 capsule (50,000 Units total) by mouth once a week.    folic acid (FOLVITE) 1 MG tablet TAKE 1 TABLET EVERY DAY    furosemide (LASIX) 20 MG tablet Take 1 tablet (20 mg total) by mouth daily.    magnesium oxide (MAG-OX) 400 (240 Mg) MG tablet TAKE 1 TABLET TWICE DAILY    methotrexate (RHEUMATREX) 2.5 MG tablet Take 6 tablets (15 mg total) by mouth once a week. Caution:Chemotherapy. Protect from light. 11/20/2020:  Pt has been taking 5 tablets.    NON FORMULARY Fluid restriction of 1.8 liters per day (1775m total) 7-3 = 800 ml 3-11 = 800 ml 11-7 = 175 ml Document totals qshift. Every Shift Day, Evening, Nigh    NON FORMULARY Diet: _____ Regular, __x____ NAS, _______Consistent Carbohydrate, _______NPO _____Other    predniSONE (DELTASONE) 5 MG tablet Take 2 tablets (10 mg total) by mouth daily with breakfast.    triamterene-hydrochlorothiazide (MAXZIDE-25) 37.5-25 MG tablet TAKE 1 TABLET EVERY DAY (DOSE REDUCTION)    [DISCONTINUED] doxycycline (VIBRAMYCIN) 100 MG capsule Take nightly until your scheduled office visit with Dr. WJeffie Pollock10/13/2022: she didn't refill it    [DISCONTINUED] ezetimibe (ZETIA) 10 MG tablet TAKE 1 TABLET EVERY DAY    [DISCONTINUED] gabapentin (NEURONTIN) 100 MG capsule Take one capsule by mouth every morning and take two capsules at bedtime    [DISCONTINUED] Vibegron (GEMTESA) 75 MG TABS Take 75 mg by mouth daily.    No facility-administered encounter medications on file as of 01/02/2021.    Past Medical History:  Diagnosis Date   Acute deep vein thrombosis (DVT) of left femoral vein ([place] 02/07/2019   Dx 02/05/2019   Arthritis    spinal stenosis   Deep vein thrombosis (  DVT) of left lower extremity ([place]) 08/04/2019   Elevated LFTs    secondary to fatty liver, negative work-up in 2011   Hypercholesterolemia    Hypertension    Lumbar spinal stenosis 01/14/2018   L3-4 and L4-5   Myopathy 02/15/2018   Osteoarthritis    right knee   Peripheral neuropathy 07/06/2018   PONV (postoperative nausea and vomiting)    Uterine cancer ([place]) 08/2006   grade 1, no recurrence up to 2013    Past Surgical History:  Procedure Laterality Date   ABDOMINAL HYSTERECTOMY  2008   adenocarcinoma stage 1   APPENDECTOMY  1973   BREAST BIOPSY Left 2018   Benign   CHOLECYSTECTOMY  1973   COLONOSCOPY    06/21/2007   [facility]:[id number] rectum/Sigmoid diverticula, diminutive hepatic flexure polyp  s/p bx. Benign.    COLONOSCOPY N/A 10/17/2012   Dr. Oneida Alar- moderate diverticulosis was noted in the sigmoid colon, moderate sized internal hemorrhoids. next tcs in10 years   ESOPHAGOGASTRODUODENOSCOPY N/A 03/20/2016   Dr. Oneida Alar, widely patent Schatzki ring, anemia due to ASA induced erosive gastritis, mild duodenitis. gastric bx benign without H.pylori.    FOOT SURGERY  2007   Pins in toes on left foot, 5 to 6 yrs ago   [place] N/A 03/20/2016   Procedure: GIVENS CAPSULE STUDY;  Surgeon: Danie Binder, MD;  [facility];  Service: Endoscopy;  Laterality: N/A;   MUSCLE BIOPSY Left 06/07/2018   Procedure: LEFT THIGH MUSCLE BIOPSY;  Surgeon: Erroll Luna, MD;  Location: [facility];  Service: General;  Laterality: Left;   TONSILLECTOMY      Social History   Socioeconomic History   Marital status: Widowed    Spouse name: Jeneen Rinks    Number of children: 3   Years of education: 12   Highest education level: 12th grade  Occupational History   Occupation: retired   Tobacco Use   Smoking status: Never   Smokeless tobacco: Never   Tobacco comments:    Never smoked  Vaping Use   Vaping Use: Never used  Substance and Sexual Activity   Alcohol use: No   Drug use: No   Sexual activity: Not Currently    Birth control/protection: Post-menopausal, Surgical  Other Topics Concern   Not on file  Social History Narrative   Husband Jeneen Rinks recently passed away   Caffeine use: soda daily   Right handed    Social Determinants of Health   Financial Resource Strain: Low Risk    Difficulty of Paying Living Expenses: Not very hard  Food Insecurity: No Food Insecurity   Worried About Charity fundraiser in the Last Year: Never true   Ran Out of Food in the Last Year: Never true  Transportation Needs: No Transportation Needs   Lack of Transportation (Medical): No   Lack of Transportation (Non-Medical): No  Physical Activity: Inactive   Days of Exercise per  Week: 0 days   Minutes of Exercise per Session: 0 min  Stress: Stress Concern Present   Feeling of Stress : To some extent  Social Connections: Socially Isolated   Frequency of Communication with Friends and Family: More than three times a week   Frequency of Social Gatherings with Friends and Family: Once a week   Attends Religious Services: Never   Marine scientist or Organizations: No   Attends Archivist Meetings: Never   Marital Status: Widowed  Intimate Partner Violence: Not At Risk  Fear of Current or Ex-Partner: No   Emotionally Abused: No   Physically Abused: No   Sexually Abused: No    Family History  Problem Relation Age of Onset   Heart disease Father    Bladder Cancer Brother        in remission    Hypertension Sister    Dementia Sister    Diabetes type II Sister    Hypertension Sister    Aneurysm Sister    Hypertension Brother    Arthritis Brother    Hypertension Son    Colon cancer Neg Hx        Objective: Vitals:   01/02/21 0947  BP: 116/68  Pulse: (!) 52  Temp: 98.6 F (37 C)      Physical Exam  Lab Results:  No results found for this or any previous visit (from the past 24 hour(s)).  Recent Results (from the past 2160 hour(s))  Comprehensive metabolic panel     Status: Abnormal   Collection Time: 10/10/20 10:59 AM  Result Value Ref Range   Sodium 141 135 - 145 mmol/L   Potassium 3.3 (L) 3.5 - 5.1 mmol/L   Chloride 103 98 - 111 mmol/L   CO2 26 22 - 32 mmol/L   Glucose, Bld 94 70 - 99 mg/dL    Comment: Glucose reference range applies only to samples taken after fasting for at least 8 hours.   BUN 34 (H) 8 - 23 mg/dL   Creatinine, Ser 0.82 0.44 - 1.00 mg/dL   Calcium 9.2 8.9 - 10.3 mg/dL   Total Protein 6.1 (L) 6.5 - 8.1 g/dL   Albumin 3.5 3.5 - 5.0 g/dL   AST 30 15 - 41 U/L   ALT 23 0 - 44 U/L   Alkaline Phosphatase 37 (L) 38 - 126 U/L   Total Bilirubin 0.9 0.3 - 1.2 mg/dL   GFR, Estimated >60 >60 mL/min     Comment: (NOTE) Calculated using the CKD-EPI Creatinine Equation (2021)    Anion gap 12 5 - 15    Comment: Performed at [facility], [address], [place], [place] [address]  CBC     Status: Abnormal   Collection Time: 10/10/20 10:59 AM  Result Value Ref Range   WBC 7.5 4.0 - 10.5 K/uL   RBC 3.84 (L) 3.87 - 5.11 MIL/uL   Hemoglobin 12.6 12.0 - 15.0 g/dL   HCT 39.6 36.0 - 46.0 %   MCV 103.1 (H) 80.0 - 100.0 fL   MCH 32.8 26.0 - 34.0 pg   MCHC 31.8 30.0 - 36.0 g/dL   RDW 12.7 11.5 - 15.5 %   Platelets 188 150 - 400 K/uL   nRBC 0.0 0.0 - 0.2 %    Comment: Performed at [facility], [address], [place], [place] [address]  Type and screen [facility]     Status: None   Collection Time: 10/10/20 10:59 AM  Result Value Ref Range   ABO/RH(D) B POS    Antibody Screen NEG    Sample Expiration      10/13/2020,2359 Performed at [facility], [address], [place],  [address]   Protime-INR - (order if Patient is taking Coumadin / Warfarin)     Status: Abnormal   Collection Time: 10/10/20 10:59 AM  Result Value Ref Range   Prothrombin Time 15.9 (H) 11.4 - 15.2 seconds   INR 1.3 (H) 0.8 - 1.2    Comment: (NOTE) INR goal varies based on device  and disease states. Performed at [facility], [address] [place], [place] [address]   CBC with Differential/Platelet     Status: Abnormal   Collection Time: 10/14/20 11:19 AM  Result Value Ref Range   WBC 7.9 4.0 - 10.5 K/uL   RBC 3.63 (L) 3.87 - 5.11 MIL/uL   Hemoglobin 11.7 (L) 12.0 - 15.0 g/dL   HCT 36.6 36.0 - 46.0 %   MCV 100.8 (H) 80.0 - 100.0 fL   MCH 32.2 26.0 - 34.0 pg   MCHC 32.0 30.0 - 36.0 g/dL   RDW 12.5 11.5 - 15.5 %   Platelets 198 150 - 400 K/uL   nRBC 0.0 0.0 - 0.2 %   Neutrophils Relative % 71 %   Neutro Abs 5.7 1.7 - 7.7 K/uL   Lymphocytes Relative 17 %   Lymphs Abs 1.4 0.7 - 4.0 K/uL   Monocytes Relative 9 %   Monocytes Absolute 0.7 0.1 - 1.0 K/uL   Eosinophils Relative 1 %   Eosinophils  Absolute 0.1 0.0 - 0.5 K/uL   Basophils Relative 1 %   Basophils Absolute 0.0 0.0 - 0.1 K/uL   Immature Granulocytes 1 %   Abs Immature Granulocytes 0.04 0.00 - 0.07 K/uL    Comment: Performed at [facility], [address], [place], [place] [address]  Urinalysis, Routine w reflex microscopic     Status: None   Collection Time: 10/14/20  1:08 PM  Result Value Ref Range   Specific Gravity, UA 1.020 1.005 - 1.030   pH, UA 6.5 5.0 - 7.5   Color, UA Yellow Yellow   Appearance Ur Clear Clear   Leukocytes,UA Negative Negative   Protein,UA Negative Negative/Trace   Glucose, UA Negative Negative   Ketones, UA Negative Negative   RBC, UA Negative Negative   Bilirubin, UA Negative Negative   Urobilinogen, Ur 0.2 0.2 - 1.0 mg/dL   Nitrite, UA Negative Negative   Microscopic Examination Comment     Comment: Microscopic follows if indicated.  CULTURE, URINE COMPREHENSIVE     Status: None   Collection Time: 10/14/20  1:50 PM   UR  Result Value Ref Range   Urine Culture, Comprehensive Final report    Organism ID, Bacteria Comment     Comment: Viridans streptococcus group Greater than 100,000 colony forming units per mL Susceptibility not normally performed on this organism.    Organism ID, Bacteria Comment     Comment: Mixed urogenital flora 10,000-25,000 colony forming units per mL   CK     Status: Abnormal   Collection Time: 10/22/20 12:03 PM  Result Value Ref Range   Total CK 174 (H) 26 - 161 U/L  Urinalysis, Routine w reflex microscopic     Status: Abnormal   Collection Time: 11/07/20 10:41 AM  Result Value Ref Range   Specific Gravity, UA 1.020 1.005 - 1.030   pH, UA 6.5 5.0 - 7.5   Color, UA Yellow Yellow   Appearance Ur Cloudy (A) Clear   Leukocytes,UA Negative Negative   Protein,UA Negative Negative/Trace   Glucose, UA Negative Negative   Ketones, UA Negative Negative   RBC, UA Negative Negative   Bilirubin, UA Negative Negative   Urobilinogen, Ur 0.2 0.2 - 1.0 mg/dL    Nitrite, UA Negative Negative   Microscopic Examination See below:   Microscopic Examination     Status: Abnormal   Collection Time: 11/07/20 10:41 AM   Urine  Result Value Ref Range   WBC, UA None  seen 0 - 5 /hpf   RBC None seen 0 - 2 /hpf   Epithelial Cells (non renal) 0-10 0 - 10 /hpf   Bacteria, UA Many (A) None seen/Few   Yeast, UA Present (A) None seen  Urine Culture     Status: Abnormal   Collection Time: 11/07/20 11:20 AM   Specimen: Urine, Catheterized   UR  Result Value Ref Range   Urine Culture, Routine Final report (A)    Organism ID, Bacteria Comment (A)     Comment: Streptococcus gallolyticus ssp gallolyticus (formerly S. bovis) Greater than 100,000 colony forming units per mL Susceptibility not normally performed on this organism.    ORGANISM ID, BACTERIA Comment     Comment: Mixed urogenital flora 10,000-25,000 colony forming units per mL   Urinalysis, Routine w reflex microscopic     Status: Abnormal   Collection Time: 11/15/20 10:27 AM  Result Value Ref Range   Specific Gravity, UA 1.020 1.005 - 1.030   pH, UA 7.0 5.0 - 7.5   Color, UA Amber (A) Yellow   Appearance Ur Hazy (A) Clear   Leukocytes,UA Trace (A) Negative   Protein,UA Negative Negative/Trace   Glucose, UA Negative Negative   Ketones, UA Negative Negative   RBC, UA Negative Negative   Bilirubin, UA Negative Negative   Urobilinogen, Ur 0.2 0.2 - 1.0 mg/dL   Nitrite, UA Negative Negative   Microscopic Examination See below:   Microscopic Examination     Status: Abnormal   Collection Time: 11/15/20 10:27 AM   Urine  Result Value Ref Range   WBC, UA 0-5 0 - 5 /hpf   RBC None seen 0 - 2 /hpf   Epithelial Cells (non renal) None seen 0 - 10 /hpf   Renal Epithel, UA None seen None seen /hpf   Bacteria, UA Many (A) None seen/Few  Urine Culture     Status: None   Collection Time: 11/15/20 10:35 AM   Specimen: Urine   UR  Result Value Ref Range   Urine Culture, Routine Final report     Organism ID, Bacteria Comment     Comment: Viridans streptococcus group Greater than 100,000 colony forming units per mL Susceptibility not normally performed on this organism.     UA reviewed with results noted above.  Report entry not complete.    Studies/Results:  CLINICAL DATA:  History of uterine cancer status post hysterectomy. Recent occlusive left lower extremity DVT.   EXAM: CT ABDOMEN AND PELVIS WITH CONTRAST   TECHNIQUE: Multidetector CT imaging of the abdomen and pelvis was performed using the standard protocol following bolus administration of intravenous contrast.   CONTRAST:  161m OMNIPAQUE IOHEXOL 300 MG/ML  SOLN   COMPARISON:  01/13/2011   FINDINGS: Lower chest: Scarring/atelectasis in the medial left upper lobe.   Hepatobiliary: 13 mm probable cyst in the posterior right hepatic dome.   Status post cholecystectomy. No intrahepatic or extrahepatic ductal dilatation.   Pancreas: Within normal limits.   Spleen: Within normal limits.   Adrenals/Urinary Tract: Adrenal glands are within normal limits.   Subcentimeter bilateral renal cysts.  No hydronephrosis.   Bladder is notable for mild mucosal hyperenhancement (series 3/image 83).   Stomach/Bowel: Stomach is within normal limits.   No evidence of bowel obstruction.   Appendix is not discretely visualized.   Left colonic diverticulosis, without evidence of diverticulitis.   Vascular/Lymphatic: No evidence of abdominal aortic aneurysm.   Atherosclerotic calcifications of the abdominal aorta and branch vessels.  No suspicious abdominopelvic lymphadenopathy.   Reproductive: Status post hysterectomy.   No adnexal masses.   Other: No abdominopelvic ascites.   Musculoskeletal: Mild degenerative changes of the lower thoracic spine.   IMPRESSION: Status post hysterectomy. No findings suspicious for recurrent or metastatic disease.   Mild mucosal enhancement along the left  posterolateral bladder wall. Consider cystoscopy for further evaluation, as clinically warranted.   Additional ancillary findings as above.     Electronically Signed   By: Julian Hy M.D.   On: 03/06/2019 18:16   11/07/20: Procedure.  Flexible cystoscopy.  She was frog legged and the genitalia was prepped with betadine.   The flexible scope was passed and about 57m of slightly turbid urine was aspirated and sent for culture.   The urethra is normal.  The bladder wall has moderate trabeculation with mild mucosal erythema most consistent with chornic inflammation.   No tumors or stones were noted.  The UO's were normal.   Assessment & Plan: Chronic cystitis with some bladder wall inflammation.   She doesn't think she is on doxycycline any more but her bladder is empty and she didn't get a specimen.  I will get a cath UA at next f/u.  Urge incontinence. This hasn't improved with the antibiotics or Gemtesa.  I will have her return for PTNS.    Bladder wall lesion.  There was nothing worrisome on cystoscopy.  She has trabeculation which is the probable source of the bladder wall thickening.   No orders of the defined types were placed in this encounter.     Orders Placed This Encounter  Procedures   Urine Culture    Cath with first PTNS    Standing Status:   Future    Standing Expiration Date:   03/04/2021   Urinalysis, Routine w reflex microscopic   Urinalysis, Routine w reflex microscopic    Standing Status:   Future    Standing Expiration Date:   04/04/2021   BLADDER SCAN AMB NON-IMAGING       Return for Next available start PTNS wkly x 12 and get a cath UA/Cx at first visit. See me in 6-8wks.   CC: SFayrene Helper MD      JIrine Seal10/13/2022 Patient ID: EQuincy Carnes female   DOB: 325-Oct-1934 87y.o.   MRN: [id number] ID: ERANEE PEASLEY female   DOB: 311-03-1932 87y.o.   MRN: [id number] ID: ELATYSHA THACKSTON female   DOB: 3March 07, 1934 87y.o.   MRN:  [id number]

## 2021-01-06 ENCOUNTER — Other Ambulatory Visit: Payer: Self-pay

## 2021-01-06 ENCOUNTER — Ambulatory Visit (INDEPENDENT_AMBULATORY_CARE_PROVIDER_SITE_OTHER): Payer: Medicare HMO

## 2021-01-06 DIAGNOSIS — N3941 Urge incontinence: Secondary | ICD-10-CM | POA: Diagnosis not present

## 2021-01-06 DIAGNOSIS — N3020 Other chronic cystitis without hematuria: Secondary | ICD-10-CM | POA: Diagnosis not present

## 2021-01-06 DIAGNOSIS — N390 Urinary tract infection, site not specified: Secondary | ICD-10-CM | POA: Diagnosis not present

## 2021-01-06 DIAGNOSIS — R32 Unspecified urinary incontinence: Secondary | ICD-10-CM

## 2021-01-06 LAB — URINALYSIS, ROUTINE W REFLEX MICROSCOPIC
Bilirubin, UA: NEGATIVE
Glucose, UA: NEGATIVE
Ketones, UA: NEGATIVE
Nitrite, UA: NEGATIVE
Specific Gravity, UA: 1.02 (ref 1.005–1.030)
Urobilinogen, Ur: 0.2 mg/dL (ref 0.2–1.0)
pH, UA: 7.5 (ref 5.0–7.5)

## 2021-01-06 LAB — MICROSCOPIC EXAMINATION
Epithelial Cells (non renal): NONE SEEN /hpf (ref 0–10)
RBC, Urine: >30 /hpf — AB (ref 0–2)
Renal Epithel, UA: NONE SEEN /hpf

## 2021-01-06 NOTE — Addendum Note (Signed)
Addended by: Pollyann Kennedy F on: 01/06/2021 12:05 PM   Modules accepted: Orders

## 2021-01-06 NOTE — Progress Notes (Signed)
Pt is prepped for and in and out catherization. Patient was cleaned and prepped in a sterle fashion with betadine. A 14 fr catheter foley was inserted. Urine return was noted at 20 ml.  Performed by [facility] LPN  Urine sent  for UA and culture   PTNS  Session # 1 of 12  Health & Social Factors: prn fluid pills Caffeine: 16 oz Alcohol: none Daytime voids #per day: mostly incontinent Night-time voids #per night: mostly incontinent Urgency: yes Incontinence Episodes #per day: all the time Ankle used: right Treatment Setting: 2 Feeling/ Response: positive sensation from heel to toe  Comments: n/a  Performed By: [facility] LPN  Follow Up: Keep next scheduled NV

## 2021-01-06 NOTE — Addendum Note (Signed)
Addended by: Pollyann Kennedy F on: 01/06/2021 12:04 PM   Modules accepted: Orders

## 2021-01-09 ENCOUNTER — Telehealth (HOSPITAL_COMMUNITY): Payer: Self-pay | Admitting: Physician Assistant

## 2021-01-09 NOTE — Telephone Encounter (Signed)
Asked to contact pt in regards to copay for Eliquis.  After speaking with her... the main issue is that they were holding her Eliquis script until they got refills on her other meds(prescribed by other drs) I called [facility] this morning and they claimed the Eliquis was mailed on Monday.. but the pt has not received it yet. She stated she may miss one a dose if she doesn't get it tomorrow.. I will call the pharmacy back this afternoon to verify what I was told this am.

## 2021-01-10 LAB — URINE CULTURE

## 2021-01-13 ENCOUNTER — Ambulatory Visit (INDEPENDENT_AMBULATORY_CARE_PROVIDER_SITE_OTHER): Payer: Medicare HMO

## 2021-01-13 ENCOUNTER — Other Ambulatory Visit: Payer: Self-pay

## 2021-01-13 DIAGNOSIS — N3941 Urge incontinence: Secondary | ICD-10-CM | POA: Diagnosis not present

## 2021-01-13 MED ORDER — NITROFURANTOIN MONOHYD MACRO 100 MG PO CAPS: 100.0000 mg | ORAL_CAPSULE | Freq: Two times a day (BID) | ORAL | 0 refills | Status: DC

## 2021-01-13 NOTE — Progress Notes (Signed)
Patient came today for PTNS treatment. Patient LE presented with 3+ pitting edema. Patient reports she has not been taking her fluid pill due to "makes me pee a lot" encouraged patient she needed to take her fluid medications as her feet and ankles were swollen today and PTNS attempted twice but patient was unable to feel any sensation.  H. Cobb attempted as well for PTNS but patient was unable to feel anything for treatment. Patient rescheduled for later this week to attempt PTNS again.

## 2021-01-13 NOTE — Progress Notes (Signed)
Patient in office today for PTNS and made aware of abt sent to pharmacy and repeat culture.

## 2021-01-16 ENCOUNTER — Other Ambulatory Visit: Payer: Self-pay

## 2021-01-16 ENCOUNTER — Ambulatory Visit (INDEPENDENT_AMBULATORY_CARE_PROVIDER_SITE_OTHER): Payer: Medicare HMO

## 2021-01-16 DIAGNOSIS — N3941 Urge incontinence: Secondary | ICD-10-CM | POA: Diagnosis not present

## 2021-01-16 NOTE — Progress Notes (Signed)
PTNS  Session # 2 of 12  Health & Social Factors: fluid pills prn Caffeine: 16 oz Alcohol: none Daytime voids #per day: 5-6 Night-time voids #per night: 4 Urgency: yes Incontinence Episodes #per day: all the time Ankle used: right Treatment Setting: 2 Feeling/ Response: positive sensation Comments: n/a  Performed By: [facility] LPN  Follow Up: Keep next scheduled NV

## 2021-01-21 ENCOUNTER — Ambulatory Visit (INDEPENDENT_AMBULATORY_CARE_PROVIDER_SITE_OTHER): Payer: Medicare HMO

## 2021-01-21 ENCOUNTER — Other Ambulatory Visit: Payer: Self-pay

## 2021-01-21 DIAGNOSIS — N3941 Urge incontinence: Secondary | ICD-10-CM

## 2021-01-21 NOTE — Progress Notes (Signed)
PTNS  Session # 3 of 45  Health & Social Factors: fluid pills prn Caffeine: 16 oz Alcohol: none Daytime voids #per day: 6 Night-time voids #per night: 4 Urgency: yes Incontinence Episodes #per day: almost always Ankle used: left Treatment Setting: 1 Feeling/ Response: positive sensation Comments: n/a  Performed By: Khaleesi Gruel LPN  Follow Up: Keep next scheduled NV

## 2021-01-23 ENCOUNTER — Other Ambulatory Visit: Payer: Self-pay

## 2021-01-23 ENCOUNTER — Ambulatory Visit (INDEPENDENT_AMBULATORY_CARE_PROVIDER_SITE_OTHER): Payer: Medicare HMO | Admitting: *Deleted

## 2021-01-23 DIAGNOSIS — Z0000 Encounter for general adult medical examination without abnormal findings: Secondary | ICD-10-CM

## 2021-01-23 NOTE — Patient Instructions (Signed)
[name] [name] , Thank you for taking time to come for your Medicare Wellness Visit. I appreciate your ongoing commitment to your health goals. Please review the following plan we discussed and let me know if I can assist you in the future.   Screening recommendations/referrals: Colonoscopy: No longer required Mammogram: No longer required Bone Density: Completed 05/17/2018 Recommended yearly ophthalmology/optometry visit for glaucoma screening and checkup Recommended yearly dental visit for hygiene and checkup  Vaccinations: Influenza vaccine: Completed 11/20/2020 Pneumococcal vaccine: Up to date Tdap vaccine: Due now Shingles vaccine: Due now    Advanced directives: Information discussed  Conditions/risks identified: Hypertension, Hyperlipidemia  Next appointment: 1 year   Preventive Care 63 Years and Older, Female Preventive care refers to lifestyle choices and visits with your health care provider that can promote health and wellness. What does preventive care include? A yearly physical exam. This is also called an annual well check. Dental exams once or twice a year. Routine eye exams. Ask your health care provider how often you should have your eyes checked. Personal lifestyle choices, including: Daily care of your teeth and gums. Regular physical activity. Eating a healthy diet. Avoiding tobacco and drug use. Limiting alcohol use. Practicing safe sex. Taking low-dose aspirin every day. Taking vitamin and mineral supplements as recommended by your health care provider. What happens during an annual well check? The services and screenings done by your health care provider during your annual well check will depend on your age, overall health, lifestyle risk factors, and family history of disease. Counseling  Your health care provider may ask you questions about your: Alcohol use. Tobacco use. Drug use. Emotional well-being. Home and relationship well-being. Sexual  activity. Eating habits. History of falls. Memory and ability to understand (cognition). Work and work Statistician. Reproductive health. Screening  You may have the following tests or measurements: Height, weight, and BMI. Blood pressure. Lipid and cholesterol levels. These may be checked every 5 years, or more frequently if you are over 93 years old. Skin check. Lung cancer screening. You may have this screening every year starting at age 4 if you have a 30-pack-year history of smoking and currently smoke or have quit within the past 15 years. Fecal occult blood test (FOBT) of the stool. You may have this test every year starting at age 59. Flexible sigmoidoscopy or colonoscopy. You may have a sigmoidoscopy every 5 years or a colonoscopy every 10 years starting at age 67. Hepatitis C blood test. Hepatitis B blood test. Sexually transmitted disease (STD) testing. Diabetes screening. This is done by checking your blood sugar (glucose) after you have not eaten for a while (fasting). You may have this done every 1-3 years. Bone density scan. This is done to screen for osteoporosis. You may have this done starting at age 21. Mammogram. This may be done every 1-2 years. Talk to your health care provider about how often you should have regular mammograms. Talk with your health care provider about your test results, treatment options, and if necessary, the need for more tests. Vaccines  Your health care provider may recommend certain vaccines, such as: Influenza vaccine. This is recommended every year. Tetanus, diphtheria, and acellular pertussis (Tdap, Td) vaccine. You may need a Td booster every 10 years. Zoster vaccine. You may need this after age 33. Pneumococcal 13-valent conjugate (PCV13) vaccine. One dose is recommended after age 55. Pneumococcal polysaccharide (PPSV23) vaccine. One dose is recommended after age 16. Talk to your health care provider about which screenings and  vaccines  you need and how often you need them. This information is not intended to replace advice given to you by your health care provider. Make sure you discuss any questions you have with your health care provider. Document Released: 04/05/2015 Document Revised: 11/27/2015 Document Reviewed: 01/08/2015 Elsevier Interactive Patient Education  2017 [place] Prevention in the Home Falls can cause injuries. They can happen to people of all ages. There are many things you can do to make your home safe and to help prevent falls. What can I do on the outside of my home? Regularly fix the edges of walkways and driveways and fix any cracks. Remove anything that might make you trip as you walk through a door, such as a raised step or threshold. Trim any bushes or trees on the path to your home. Use bright outdoor lighting. Clear any walking paths of anything that might make someone trip, such as rocks or tools. Regularly check to see if handrails are loose or broken. Make sure that both sides of any steps have handrails. Any raised decks and porches should have guardrails on the edges. Have any leaves, snow, or ice cleared regularly. Use sand or salt on walking paths during winter. Clean up any spills in your garage right away. This includes oil or grease spills. What can I do in the bathroom? Use night lights. Install grab bars by the toilet and in the tub and shower. Do not use towel bars as grab bars. Use non-skid mats or decals in the tub or shower. If you need to sit down in the shower, use a plastic, non-slip stool. Keep the floor dry. Clean up any water that spills on the floor as soon as it happens. Remove soap buildup in the tub or shower regularly. Attach bath mats securely with double-sided non-slip rug tape. Do not have throw rugs and other things on the floor that can make you trip. What can I do in the bedroom? Use night lights. Make sure that you have a light by your bed that  is easy to reach. Do not use any sheets or blankets that are too big for your bed. They should not hang down onto the floor. Have a firm chair that has side arms. You can use this for support while you get dressed. Do not have throw rugs and other things on the floor that can make you trip. What can I do in the kitchen? Clean up any spills right away. Avoid walking on wet floors. Keep items that you use a lot in easy-to-reach places. If you need to reach something above you, use a strong step stool that has a grab bar. Keep electrical cords out of the way. Do not use floor polish or wax that makes floors slippery. If you must use wax, use non-skid floor wax. Do not have throw rugs and other things on the floor that can make you trip. What can I do with my stairs? Do not leave any items on the stairs. Make sure that there are handrails on both sides of the stairs and use them. Fix handrails that are broken or loose. Make sure that handrails are as long as the stairways. Check any carpeting to make sure that it is firmly attached to the stairs. Fix any carpet that is loose or worn. Avoid having throw rugs at the top or bottom of the stairs. If you do have throw rugs, attach them to the floor with carpet tape. Make  sure that you have a light switch at the top of the stairs and the bottom of the stairs. If you do not have them, ask someone to add them for you. What else can I do to help prevent falls? Wear shoes that: Do not have high heels. Have rubber bottoms. Are comfortable and fit you well. Are closed at the toe. Do not wear sandals. If you use a stepladder: Make sure that it is fully opened. Do not climb a closed stepladder. Make sure that both sides of the stepladder are locked into place. Ask someone to hold it for you, if possible. Clearly mark and make sure that you can see: Any grab bars or handrails. First and last steps. Where the edge of each step is. Use tools that help you  move around (mobility aids) if they are needed. These include: Canes. Walkers. Scooters. Crutches. Turn on the lights when you go into a dark area. Replace any light bulbs as soon as they burn out. Set up your furniture so you have a clear path. Avoid moving your furniture around. If any of your floors are uneven, fix them. If there are any pets around you, be aware of where they are. Review your medicines with your doctor. Some medicines can make you feel dizzy. This can increase your chance of falling. Ask your doctor what other things that you can do to help prevent falls. This information is not intended to replace advice given to you by your health care provider. Make sure you discuss any questions you have with your health care provider. Document Released: 01/03/2009 Document Revised: 08/15/2015 Document Reviewed: 04/13/2014 Elsevier Interactive Patient Education  2017 [employer].

## 2021-01-23 NOTE — Progress Notes (Signed)
Subjective:   [name] [name] is a 87 y.o. female who presents for Medicare Annual (Subsequent) preventive examination. I connected with  CLARAMAE RIGDON on 01/23/21 by audio enabled telemedicine application and verified that I am speaking with the correct person using two identifiers.   I discussed the limitations of evaluation and management by telemedicine. The patient expressed understanding and agreed to proceed.   Review of Systems           Objective:    There were no vitals filed for this visit. There is no height or weight on file to calculate BMI.  Advanced Directives 10/10/2020 10/07/2020 03/08/2020 02/02/2020 01/30/2020 01/22/2020 12/07/2019  Does Patient Have a Medical Advance Directive? Yes Yes No Yes Yes No Yes  Type of Paramedic of [place];Living will [place];Living will - [place];Living will [place];Living will - [place];Living will  Does patient want to make changes to medical advance directive? No - Patient declined No - Patient declined - No - Patient declined No - Patient declined - No - Patient declined  Copy of [place] in Chart? No - copy requested No - copy requested - - No - copy requested - No - copy requested  Would patient like information on creating a medical advance directive? - - - - No - Patient declined Yes (MAU/Ambulatory/Procedural Areas - Information given) No - Patient declined    Current Medications (verified) Outpatient Encounter Medications as of 01/23/2021  Medication Sig   acetaminophen (TYLENOL) 650 MG CR tablet Take 650 mg by mouth every 6 (six) hours.    acyclovir (ZOVIRAX) 800 MG tablet Take 1 tablet (800 mg total) by mouth 3 (three) times daily.   apixaban (ELIQUIS) 5 MG TABS tablet Take 1 tablet (5 mg total) by mouth 2 (two) times daily.   clindamycin (CLEOCIN) 150 MG capsule Take 1 capsule (150 mg total) by mouth  every 6 (six) hours.   ergocalciferol (VITAMIN D2) 1.25 MG (50000 UT) capsule Take 1 capsule (50,000 Units total) by mouth once a week.   ezetimibe (ZETIA) 10 MG tablet Take 1 tablet (10 mg total) by mouth daily.   folic acid (FOLVITE) 1 MG tablet TAKE 1 TABLET EVERY DAY   furosemide (LASIX) 20 MG tablet Take 1 tablet (20 mg total) by mouth daily.   gabapentin (NEURONTIN) 100 MG capsule Take one capsule by mouth every morning and take two capsules at bedtime   magnesium oxide (MAG-OX) 400 (240 Mg) MG tablet TAKE 1 TABLET TWICE DAILY   methotrexate (RHEUMATREX) 2.5 MG tablet Take 6 tablets (15 mg total) by mouth once a week. Caution:Chemotherapy. Protect from light.   nitrofurantoin, macrocrystal-monohydrate, (MACROBID) 100 MG capsule Take 1 capsule (100 mg total) by mouth 2 (two) times daily.   NON FORMULARY Fluid restriction of 1.8 liters per day (1777m total) 7-3 = 800 ml 3-11 = 800 ml 11-7 = 175 ml Document totals qshift. Every Shift Day, Evening, Nigh   NON FORMULARY Diet: _____ Regular, __x____ NAS, _______Consistent Carbohydrate, _______NPO _____Other   predniSONE (DELTASONE) 5 MG tablet Take 2 tablets (10 mg total) by mouth daily with breakfast.   triamterene-hydrochlorothiazide (MAXZIDE-25) 37.5-25 MG tablet TAKE 1 TABLET EVERY DAY (DOSE REDUCTION)   No facility-administered encounter medications on file as of 01/23/2021.    Allergies (verified) Cellcept [mycophenolate mofetil], Statins, Ciprofloxacin hcl, Hydrocodone, and Penicillins   History: Past Medical History:  Diagnosis Date  Acute deep vein thrombosis (DVT) of left femoral vein ([place]) 02/07/2019   Dx 02/05/2019   Arthritis    spinal stenosis   Deep vein thrombosis (DVT) of left lower extremity ([place]) 08/04/2019   Elevated LFTs    secondary to fatty liver, negative work-up in 2011   Hypercholesterolemia    Hypertension    Lumbar spinal stenosis 01/14/2018   L3-4 and L4-5   Myopathy 02/15/2018    Osteoarthritis    right knee   Peripheral neuropathy 07/06/2018   PONV (postoperative nausea and vomiting)    Uterine cancer ([place]) 08/2006   grade 1, no recurrence up to 2013   Past Surgical History:  Procedure Laterality Date   ABDOMINAL HYSTERECTOMY  2008   adenocarcinoma stage 1   APPENDECTOMY  1973   BREAST BIOPSY Left 2018   Benign   CHOLECYSTECTOMY  1973   COLONOSCOPY    06/21/2007   PPI:RJJOAC rectum/Sigmoid diverticula, diminutive hepatic flexure polyp s/p bx. Benign.    COLONOSCOPY N/A 10/17/2012   Dr. Oneida Alar- moderate diverticulosis was noted in the sigmoid colon, moderate sized internal hemorrhoids. next tcs in10 years   ESOPHAGOGASTRODUODENOSCOPY N/A 03/20/2016   Dr. Oneida Alar, widely patent Schatzki ring, anemia due to ASA induced erosive gastritis, mild duodenitis. gastric bx benign without H.pylori.    FOOT SURGERY  2007   Pins in toes on left foot, 5 to 6 yrs ago   [place] N/A 03/20/2016   Procedure: GIVENS CAPSULE STUDY;  Surgeon: Danie Binder, MD;  [facility];  Service: Endoscopy;  Laterality: N/A;   MUSCLE BIOPSY Left 06/07/2018   Procedure: LEFT THIGH MUSCLE BIOPSY;  Surgeon: Erroll Luna, MD;  Location: [facility];  Service: General;  Laterality: Left;   TONSILLECTOMY     Family History  Problem Relation Age of Onset   Heart disease Father    Bladder Cancer Brother        in remission    Hypertension Sister    Dementia Sister    Diabetes type II Sister    Hypertension Sister    Aneurysm Sister    Hypertension Brother    Arthritis Brother    Hypertension Son    Colon cancer Neg Hx    Social History   Socioeconomic History   Marital status: Widowed    Spouse name: Jeneen Rinks    Number of children: 3   Years of education: 12   Highest education level: 12th grade  Occupational History   Occupation: retired   Tobacco Use   Smoking status: Never   Smokeless tobacco: Never   Tobacco comments:    Never smoked   Vaping Use   Vaping Use: Never used  Substance and Sexual Activity   Alcohol use: No   Drug use: No   Sexual activity: Not Currently    Birth control/protection: Post-menopausal, Surgical  Other Topics Concern   Not on file  Social History Narrative   Husband Jeneen Rinks recently passed away   Caffeine use: soda daily   Right handed    Social Determinants of Health   Financial Resource Strain: Not on file  Food Insecurity: Not on file  Transportation Needs: Not on file  Physical Activity: Not on file  Stress: Not on file  Social Connections: Not on file    Tobacco Counseling Counseling given: Not Answered Tobacco comments: Never smoked   Clinical Intake:  Diabetic?No         Activities of Daily Living No flowsheet data found.  Patient Care Team: Fayrene Helper, MD as PCP - General Carole Civil, MD as Consulting Physician (Orthopedic Surgery) Irine Seal, MD as Attending Physician (Urology) Danie Binder, MD (Inactive) as Consulting Physician (Gastroenterology) Jovita Gamma, MD as Consulting Physician (Neurosurgery) Derek Jack, MD as Consulting Physician (Hematology)  Indicate any recent Medical Services you may have received from other than [facility] providers in the past year (date may be approximate).     Assessment:   This is a routine wellness examination for [place].  Hearing/Vision screen No results found.  Dietary issues and exercise activities discussed:     Goals Addressed   None   Depression Screen PHQ 2/9 Scores 11/20/2020 02/02/2020 01/22/2020 01/22/2020 01/01/2020 05/15/2019 05/15/2019  PHQ - 2 Score 2 0 0 0 0 2 2  PHQ- 9 Score 10 - - - - 6 7    Fall Risk Fall Risk  11/20/2020 10/16/2020 07/03/2020 05/28/2020 05/07/2020  Falls in the past year? 0 Exclusion - non ambulatory 1 0 0  Number falls in past yr: 0 - 1 0 0  Injury with Fall? 0 - 0 0 0  Comment - - - - -  Risk for fall due to : Impaired  mobility;Impaired balance/gait - - - -  Follow up - - - - -    FALL RISK PREVENTION PERTAINING TO THE HOME:  Any stairs in or around the home? No  If so, are there any without handrails? No  Home free of loose throw rugs in walkways, pet beds, electrical cords, etc? No  Adequate lighting in your home to reduce risk of falls? Yes   ASSISTIVE DEVICES UTILIZED TO PREVENT FALLS:  Life alert? Yes  Use of a cane, Laryssa Hassing or w/c? Yes  Grab bars in the bathroom? Yes  Shower chair or bench in shower? Yes  Elevated toilet seat or a handicapped toilet? Yes   TIMED UP AND GO:  Was the test performed? No .  Length of time to ambulate 10 feet: NA sec.     Cognitive Function:     6CIT Screen 01/22/2020 01/18/2019 01/12/2018 09/28/2016  What Year? 0 points 0 points 0 points 0 points  What month? 0 points 0 points 0 points 0 points  What time? 0 points 0 points 0 points 0 points  Count back from 20 0 points 0 points 0 points 0 points  Months in reverse 0 points 2 points 0 points 0 points  Repeat phrase 0 points 2 points 0 points 0 points  Total Score 0 4 0 0    Immunizations Immunization History  Administered Date(s) Administered   Fluad Quad(high Dose 65+) 01/09/2019, 01/01/2020, 11/20/2020   Influenza Split 12/22/2012, 01/04/2014, 12/13/2014   Influenza Whole 01/21/2007, 05/10/2008, 12/11/2008, 12/06/2009   Influenza, High Dose Seasonal PF 12/30/2017   Influenza,inj,Quad PF,6+ Mos 04/22/2017   Influenza-Unspecified 11/21/2013, 12/20/2015   Moderna Sars-Covid-2 Vaccination 05/17/2019, 06/14/2019, 01/26/2020, 09/27/2020   Pneumococcal Conjugate-13 11/02/2013   Pneumococcal Polysaccharide-23 08/15/2010   Td 08/31/2007   Zoster, Live 08/31/2006    TDAP status: Due, Education has been provided regarding the importance of this vaccine. Advised may receive this vaccine at local pharmacy or Health Dept. Aware to provide a copy of the vaccination record if obtained from local pharmacy or  Health Dept. Verbalized acceptance and understanding.  Flu Vaccine status: Up to date  Pneumococcal vaccine status: Up to  date  Covid-19 vaccine status: Completed vaccines  Qualifies for Shingles Vaccine? Yes   Zostavax completed No   Shingrix Completed?: No.    Education has been provided regarding the importance of this vaccine. Patient has been advised to call insurance company to determine out of pocket expense if they have not yet received this vaccine. Advised may also receive vaccine at local pharmacy or Health Dept. Verbalized acceptance and understanding.  Screening Tests Health Maintenance  Topic Date Due   Zoster Vaccines- Shingrix (1 of 2) Never done   COVID-19 Vaccine (5 - Booster for Moderna series) 11/22/2020   TETANUS/TDAP  05/06/2022 (Originally 08/30/2017)   Pneumonia Vaccine 44+ Years old  Completed   INFLUENZA VACCINE  Completed   DEXA SCAN  Completed   HPV VACCINES  Aged Out    Health Maintenance  Health Maintenance Due  Topic Date Due   Zoster Vaccines- Shingrix (1 of 2) Never done   COVID-19 Vaccine (5 - Booster for Moderna series) 11/22/2020    Colorectal cancer screening: No longer required.   Mammogram status: No longer required due to age.  Bone Density status: Completed 05/17/2018. Results reflect: Bone density results: OSTEOPENIA. Repeat every 2 years.  Lung Cancer Screening: (Low Dose CT Chest recommended if Age 48-80 years, 30 pack-year currently smoking OR have quit w/in 15years.) does not qualify.   Lung Cancer Screening Referral: NA  Additional Screening:  Hepatitis C Screening: does not qualify  Vision Screening: Recommended annual ophthalmology exams for early detection of glaucoma and other disorders of the eye. Is the patient up to date with their annual eye exam?  Yes  Who is the provider or what is the name of the office in which the patient attends annual eye exams? Patient was unable to recall name of provider If pt is not  established with a provider, would they like to be referred to a provider to establish care? No .   Dental Screening: Recommended annual dental exams for proper oral hygiene  Community Resource Referral / Chronic Care Management: CRR required this visit?  No   CCM required this visit?  No      Plan:     I have personally reviewed and noted the following in the patient's chart:   Medical and social history Use of alcohol, tobacco or illicit drugs  Current medications and supplements including opioid prescriptions.  Functional ability and status Nutritional status Physical activity Advanced directives List of other physicians Hospitalizations, surgeries, and ER visits in previous 12 months Vitals Screenings to include cognitive, depression, and falls Referrals and appointments  In addition, I have reviewed and discussed with patient certain preventive protocols, quality metrics, and best practice recommendations. A written personalized care plan for preventive services as well as general preventive health recommendations were provided to patient.     Danella Penton, [place]   01/23/2021   Nurse Notes: This was a telehealth visit. The patient was at home, the provider was in the office. [name], MD

## 2021-01-27 ENCOUNTER — Other Ambulatory Visit: Payer: Self-pay

## 2021-01-27 ENCOUNTER — Ambulatory Visit (INDEPENDENT_AMBULATORY_CARE_PROVIDER_SITE_OTHER): Payer: Medicare HMO

## 2021-01-27 DIAGNOSIS — N3941 Urge incontinence: Secondary | ICD-10-CM

## 2021-01-27 NOTE — Progress Notes (Signed)
PTNS  Session # 4 of 12  Health & Social Factors: none Caffeine: 1 cup  Alcohol: none Daytime voids #per day: 5-6 Night-time voids #per night: 3 Urgency: yes Incontinence Episodes #per day: almost always Ankle used: right Treatment Setting: 2 Feeling/ Response: tingling Comments: none  Performed By: H. Cobb LPN  Follow Up: 1 week NV

## 2021-02-03 ENCOUNTER — Ambulatory Visit (INDEPENDENT_AMBULATORY_CARE_PROVIDER_SITE_OTHER): Payer: Medicare HMO

## 2021-02-03 ENCOUNTER — Other Ambulatory Visit: Payer: Self-pay

## 2021-02-03 DIAGNOSIS — N390 Urinary tract infection, site not specified: Secondary | ICD-10-CM | POA: Diagnosis not present

## 2021-02-03 NOTE — Progress Notes (Signed)
PTNS  Session # 5  Health & Social Factors: none Caffeine: 1 cup Alcohol: none Daytime voids #per day: 5-6 Night-time voids #per night: 4 Urgency: yes Incontinence Episodes #per day: yes Ankle used: left Treatment Setting: 2 Feeling/ Response: tingling Comments: none  Performed By: Barnetta Chapel  Follow Up: 1 week PTNS

## 2021-02-05 LAB — URINE CULTURE

## 2021-02-07 ENCOUNTER — Other Ambulatory Visit: Payer: Self-pay | Admitting: Neurology

## 2021-02-10 ENCOUNTER — Ambulatory Visit (INDEPENDENT_AMBULATORY_CARE_PROVIDER_SITE_OTHER): Payer: Medicare HMO

## 2021-02-10 ENCOUNTER — Other Ambulatory Visit: Payer: Self-pay

## 2021-02-10 DIAGNOSIS — N3941 Urge incontinence: Secondary | ICD-10-CM

## 2021-02-10 NOTE — Progress Notes (Signed)
PTNS  Session # 6 of 45  Health & Social Factors: fluid pills occasionally Caffeine: 16 oz. Alcohol: none Daytime voids #per day: 5 Night-time voids #per night: 4 Urgency: yes Incontinence Episodes #per day: almost always Ankle used: right Treatment Setting: 2 Feeling/ Response: positive sensation Comments: n/a  Performed By: [facility] LPN   Follow Up: keep next scheduled NV

## 2021-02-17 ENCOUNTER — Ambulatory Visit (INDEPENDENT_AMBULATORY_CARE_PROVIDER_SITE_OTHER): Payer: Medicare HMO

## 2021-02-17 ENCOUNTER — Other Ambulatory Visit: Payer: Self-pay

## 2021-02-17 DIAGNOSIS — N3941 Urge incontinence: Secondary | ICD-10-CM

## 2021-02-17 NOTE — Progress Notes (Signed)
PTNS  Session # 7  Health & Social Factors: none Caffeine: 1 cup Alcohol: none Daytime voids #per day: 5 Night-time voids #per night: 3 Urgency: yes Incontinence Episodes #per day: almost always Ankle used: left Treatment Setting: 12 Feeling/ Response: tingling sensation Comments: none  Performed By: Iris Pert, LPN  Follow Up: 1 week PTNS

## 2021-02-19 ENCOUNTER — Ambulatory Visit: Payer: Medicare HMO | Admitting: Family Medicine

## 2021-02-24 ENCOUNTER — Ambulatory Visit: Payer: Medicare HMO | Admitting: Neurology

## 2021-02-24 ENCOUNTER — Ambulatory Visit: Payer: Medicare HMO

## 2021-02-25 ENCOUNTER — Other Ambulatory Visit: Payer: Self-pay

## 2021-02-25 ENCOUNTER — Ambulatory Visit (INDEPENDENT_AMBULATORY_CARE_PROVIDER_SITE_OTHER): Payer: Medicare HMO

## 2021-02-25 DIAGNOSIS — N3941 Urge incontinence: Secondary | ICD-10-CM | POA: Diagnosis not present

## 2021-02-25 NOTE — Progress Notes (Signed)
PTNS  Session # 8 of 45  Health & Social Factors: occasionally Caffeine: 16 oz Alcohol: none Daytime voids #per day: 4 Night-time voids #per night: 3 Urgency: yes Incontinence Episodes #per day: almost always Ankle used: right Treatment Setting: 2 Feeling/ Response: positive sensation Comments: n/a  Performed By: [place] LPN   Follow Up: Keep next scheduled NV

## 2021-03-03 ENCOUNTER — Other Ambulatory Visit: Payer: Self-pay

## 2021-03-03 ENCOUNTER — Ambulatory Visit (INDEPENDENT_AMBULATORY_CARE_PROVIDER_SITE_OTHER): Payer: Medicare HMO

## 2021-03-03 DIAGNOSIS — N3941 Urge incontinence: Secondary | ICD-10-CM | POA: Diagnosis not present

## 2021-03-03 NOTE — Progress Notes (Signed)
PTNS  Session # 9 of 45   Health & Social Factors: none Caffeine: yes Alcohol: none Daytime voids #per day: 5 Night-time voids #per night: 3 Urgency: yes Incontinence Episodes #per day: almost always  Ankle used: right Treatment Setting: 2 Feeling/ Response: tingling Comments: none  Performed By: Barnetta Chapel LPN  Follow Up: 1 week PTNS

## 2021-03-06 ENCOUNTER — Ambulatory Visit: Payer: Medicare HMO | Admitting: Urology

## 2021-03-09 NOTE — Progress Notes (Signed)
PTNS  Session # 10 of 45  Health & Social Factors: fluid pills Caffeine: 16 oz Alcohol: none Daytime voids #per day: 5 Night-time voids #per night: 3 Urgency: yes Incontinence Episodes #per day: almost always Ankle used: right Treatment Setting: 2 Feeling/ Response: positive sensation Comments: n/a  Performed By: [place] LPN  Follow Up: keep next scheduled NV

## 2021-03-10 ENCOUNTER — Other Ambulatory Visit: Payer: Self-pay

## 2021-03-10 ENCOUNTER — Ambulatory Visit (INDEPENDENT_AMBULATORY_CARE_PROVIDER_SITE_OTHER): Payer: Medicare HMO

## 2021-03-10 DIAGNOSIS — N3941 Urge incontinence: Secondary | ICD-10-CM | POA: Diagnosis not present

## 2021-03-13 ENCOUNTER — Ambulatory Visit: Payer: Medicare HMO | Admitting: Urology

## 2021-03-18 ENCOUNTER — Other Ambulatory Visit: Payer: Self-pay

## 2021-03-18 NOTE — Progress Notes (Addendum)
PTNS  Session # 11 of 12- not completed  Health & Social Factors: +3 LE pitting edema present. Patient was attempted PTNS today but due to LE swelling PTNS was unsuccessful. Patient will return next week to reattempt.

## 2021-03-24 NOTE — Progress Notes (Deleted)
PTNS  Session # 11 of 12  Health & Social Factors: *** Caffeine: *** Alcohol: *** Daytime voids #per day: *** Night-time voids #per night: *** Urgency: *** Incontinence Episodes #per day: *** Ankle used: *** Treatment Setting: *** Feeling/ Response: *** Comments: ***  Performed By: ***  Follow Up: ***

## 2021-03-25 ENCOUNTER — Other Ambulatory Visit: Payer: Self-pay

## 2021-03-25 ENCOUNTER — Ambulatory Visit (INDEPENDENT_AMBULATORY_CARE_PROVIDER_SITE_OTHER): Payer: Medicare HMO | Admitting: Family Medicine

## 2021-03-25 ENCOUNTER — Telehealth: Payer: Self-pay

## 2021-03-25 ENCOUNTER — Encounter: Payer: Self-pay | Admitting: Family Medicine

## 2021-03-25 VITALS — BP 123/75 | HR 87 | Ht 65.0 in

## 2021-03-25 DIAGNOSIS — G7249 Other inflammatory and immune myopathies, not elsewhere classified: Secondary | ICD-10-CM | POA: Diagnosis not present

## 2021-03-25 DIAGNOSIS — I10 Essential (primary) hypertension: Secondary | ICD-10-CM

## 2021-03-25 DIAGNOSIS — Z9181 History of falling: Secondary | ICD-10-CM

## 2021-03-25 DIAGNOSIS — R600 Localized edema: Secondary | ICD-10-CM | POA: Diagnosis not present

## 2021-03-25 DIAGNOSIS — L97921 Non-pressure chronic ulcer of unspecified part of left lower leg limited to breakdown of skin: Secondary | ICD-10-CM | POA: Diagnosis not present

## 2021-03-25 DIAGNOSIS — L97929 Non-pressure chronic ulcer of unspecified part of left lower leg with unspecified severity: Secondary | ICD-10-CM | POA: Insufficient documentation

## 2021-03-25 MED ORDER — DOXYCYCLINE HYCLATE 100 MG PO TABS: 100.0000 mg | ORAL_TABLET | Freq: Two times a day (BID) | ORAL | 0 refills | Status: DC

## 2021-03-25 MED ORDER — FUROSEMIDE 20 MG PO TABS: ORAL_TABLET | ORAL | 0 refills | Status: DC

## 2021-03-25 MED ORDER — POTASSIUM CHLORIDE CRYS ER 10 MEQ PO TBCR: EXTENDED_RELEASE_TABLET | ORAL | 0 refills | Status: DC

## 2021-03-25 NOTE — Telephone Encounter (Signed)
The directions say to take two daily tues, thurs and sat but the quantity was 6 instead of 12. Do you want to change the directions or the quantity? Please advise

## 2021-03-25 NOTE — Assessment & Plan Note (Signed)
Managed by neurology on daily prednisone

## 2021-03-25 NOTE — Telephone Encounter (Signed)
Temika from [facility] called needs clarification on Potassium that was called into pharmacy. Call back # [phone]

## 2021-03-25 NOTE — Patient Instructions (Signed)
F/u in 5 to 6 weeks, call if you need me sooner.  Nurse please refer to H/H for two to three times  weekly visits for leg ulcer clean and dress for 4 to 6 weeks  Labs today, cBC and diff, cmp and EGFR  Antibiotic , doxycycline is prescribed, ald lasix and potassium , please take as directed  Keep legs elevated    Careful not to fall!   Thanks for choosing [facility], we consider it a privelige to serve you.

## 2021-03-25 NOTE — Progress Notes (Signed)
° °  JOZIE WULF     MRN: [id number]      DOB: 10-30-1932   HPI Ms. Vanderlinden is here for follow up and re-evaluation of chronic medical conditions, medication management and review of any available recent lab and radiology data.  Preventive health is updated, specifically  Cancer screening and Immunization.     Left leg ulcer with drainage x 1 week, no fever or chills , surrounding skin red, yellow drainage present  ROS Denies recent fever or chills. Denies sinus pressure, nasal congestion, ear pain or sore throat. Denies chest congestion, productive cough or wheezing. Denies chest pains, palpitations and leg swelling Denies abdominal pain, nausea, vomiting,diarrhea or constipation.   Chronic incontinence Chronic  joint pain, swelling and limitation in mobility. Chronic left  jaw pain from shingles persists Denies depression, anxiety or insomnia.  PE  BP 123/75    Pulse 87    Ht 5' 5"  (1.651 m)    SpO2 90%    BMI 24.96 kg/m   Patient alert and oriented and in no cardiopulmonary distress.  HEENT: No facial asymmetry, EOMI,     Neck supple .  Chest: Clear to auscultation bilaterally.  CVS: S1, S2 no murmurs, no S3.Regular rate.  ABD: Soft non tender.   Ext: one plus pitting  edema  MS: decreased ROM spine, shoulders, hips and knees.  Skin: left leg ulcer max diameter 3 .5 cm, limited to skin breakdown, pink tissue present,pustular lesion distal to main ulcer , erythema of skin noted  Psych: Good eye contact, normal affect. Memory intact not anxious or depressed appearing.  CNS: CN 2-12 intact, power,  normal throughout.no focal deficits noted.   Assessment & Plan  Leg ulcer, left (HCC) Antibiotic prescribed and refer to h/h nursing twice weekly for 4 to 6 weeks for wound care. Cleaned and dry gauze applied  Essential hypertension Controlled, no change in medication DASH diet and commitment to daily physical activity for a minimum of 30 minutes discussed and encouraged, as  a part of hypertension management. The importance of attaining a healthy weight is also discussed.  BP/Weight 03/25/2021 01/02/2021 11/20/2020 11/07/2020 10/22/2020 10/16/2020 0/05/4915  Systolic BP 915 056 979 480 165 537 482  Diastolic BP 75 68 72 73 74 70 77  Wt. 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

## 2021-03-25 NOTE — Assessment & Plan Note (Signed)
Home safety reviewed ,no falls since this past 1 year

## 2021-03-25 NOTE — Assessment & Plan Note (Signed)
Antibiotic prescribed and refer to h/h nursing twice weekly for 4 to 6 weeks for wound care. Cleaned and dry gauze applied

## 2021-03-25 NOTE — Assessment & Plan Note (Signed)
Controlled, no change in medication DASH diet and commitment to daily physical activity for a minimum of 30 minutes discussed and encouraged, as a part of hypertension management. The importance of attaining a healthy weight is also discussed.  BP/Weight 03/25/2021 01/02/2021 11/20/2020 11/07/2020 10/22/2020 10/16/2020 11/26/8932  Systolic BP 068 403 353 317 409 927 800  Diastolic BP 75 68 72 73 74 70 77  Wt. (Lbs) - - 150 - - - 155  BMI 24.96 - 24.96 - 25.79 - 25.4

## 2021-03-25 NOTE — Assessment & Plan Note (Signed)
Trace present , right worse than left , short course of alternte day lasix with potassium, 6 days total

## 2021-03-26 ENCOUNTER — Other Ambulatory Visit: Payer: Self-pay

## 2021-03-26 LAB — CBC WITH DIFFERENTIAL/PLATELET
Basophils Absolute: 0 10*3/uL (ref 0.0–0.2)
Basos: 0 %
EOS (ABSOLUTE): 0.1 10*3/uL (ref 0.0–0.4)
Eos: 1 %
Hematocrit: 37.8 % (ref 34.0–46.6)
Hemoglobin: 12.6 g/dL (ref 11.1–15.9)
Immature Grans (Abs): 0 10*3/uL (ref 0.0–0.1)
Immature Granulocytes: 0 %
Lymphocytes Absolute: 0.7 10*3/uL (ref 0.7–3.1)
Lymphs: 8 %
MCH: 31 pg (ref 26.6–33.0)
MCHC: 33.3 g/dL (ref 31.5–35.7)
MCV: 93 fL (ref 79–97)
Monocytes Absolute: 0.6 10*3/uL (ref 0.1–0.9)
Monocytes: 7 %
Neutrophils Absolute: 6.9 10*3/uL (ref 1.4–7.0)
Neutrophils: 84 %
Platelets: 223 10*3/uL (ref 150–450)
RBC: 4.06 x10E6/uL (ref 3.77–5.28)
RDW: 14.5 % (ref 11.7–15.4)
WBC: 8.3 10*3/uL (ref 3.4–10.8)

## 2021-03-26 LAB — CMP14+EGFR
ALT: 19 IU/L (ref 0–32)
AST: 26 IU/L (ref 0–40)
Albumin/Globulin Ratio: 2.3 — ABNORMAL HIGH (ref 1.2–2.2)
Albumin: 4.1 g/dL (ref 3.6–4.6)
Alkaline Phosphatase: 56 IU/L (ref 44–121)
BUN/Creatinine Ratio: 36 — ABNORMAL HIGH (ref 12–28)
BUN: 35 mg/dL — ABNORMAL HIGH (ref 8–27)
Bilirubin Total: 0.4 mg/dL (ref 0.0–1.2)
CO2: 27 mmol/L (ref 20–29)
Calcium: 9.6 mg/dL (ref 8.7–10.3)
Chloride: 103 mmol/L (ref 96–106)
Creatinine, Ser: 0.98 mg/dL (ref 0.57–1.00)
Globulin, Total: 1.8 g/dL (ref 1.5–4.5)
Glucose: 88 mg/dL (ref 70–99)
Potassium: 3.8 mmol/L (ref 3.5–5.2)
Sodium: 144 mmol/L (ref 134–144)
Total Protein: 5.9 g/dL — ABNORMAL LOW (ref 6.0–8.5)
eGFR: 56 mL/min/{1.73_m2} — ABNORMAL LOW (ref >59)

## 2021-03-26 MED ORDER — POTASSIUM CHLORIDE CRYS ER 10 MEQ PO TBCR: EXTENDED_RELEASE_TABLET | ORAL | 0 refills | Status: DC

## 2021-03-26 NOTE — Telephone Encounter (Signed)
Changed quantity to 12

## 2021-03-31 ENCOUNTER — Telehealth: Payer: Self-pay

## 2021-03-31 NOTE — Telephone Encounter (Signed)
Checked with [name] [name] at [facility] and she will call [facility] and see if they have reached out to patient yet

## 2021-03-31 NOTE — Telephone Encounter (Signed)
[name] [name] will be calling patient again today- they called Friday and did not get an answer

## 2021-03-31 NOTE — Telephone Encounter (Signed)
Patient called checking to see if home health to come out to dress on leg, she has not heard anything yet.  Please contact patient [phone]

## 2021-03-31 NOTE — Telephone Encounter (Signed)
[name] [name] is aware to call back if she hears nothing today

## 2021-04-01 ENCOUNTER — Telehealth: Payer: Self-pay | Admitting: Family Medicine

## 2021-04-01 NOTE — Telephone Encounter (Signed)
Can they do Unna boots left lower leg

## 2021-04-01 NOTE — Telephone Encounter (Signed)
Gave the verbal ok. They were only wanting it for the left leg, not the right which the ulcer is on

## 2021-04-01 NOTE — Telephone Encounter (Signed)
[name] [name] is asking for the ok. Please advise

## 2021-04-02 ENCOUNTER — Ambulatory Visit: Payer: Medicare HMO

## 2021-04-03 ENCOUNTER — Other Ambulatory Visit: Payer: Self-pay | Admitting: *Deleted

## 2021-04-03 DIAGNOSIS — G7249 Other inflammatory and immune myopathies, not elsewhere classified: Secondary | ICD-10-CM

## 2021-04-03 MED ORDER — METHOTREXATE 2.5 MG PO TABS: 15.0000 mg | ORAL_TABLET | ORAL | 0 refills | Status: DC

## 2021-04-09 ENCOUNTER — Inpatient Hospital Stay (HOSPITAL_COMMUNITY): Payer: Medicare HMO | Attending: Hematology

## 2021-04-09 ENCOUNTER — Ambulatory Visit: Payer: Medicare HMO

## 2021-04-09 DIAGNOSIS — I10 Essential (primary) hypertension: Secondary | ICD-10-CM | POA: Insufficient documentation

## 2021-04-09 DIAGNOSIS — Z8249 Family history of ischemic heart disease and other diseases of the circulatory system: Secondary | ICD-10-CM | POA: Insufficient documentation

## 2021-04-09 DIAGNOSIS — Z8719 Personal history of other diseases of the digestive system: Secondary | ICD-10-CM | POA: Insufficient documentation

## 2021-04-09 DIAGNOSIS — Z8049 Family history of malignant neoplasm of other genital organs: Secondary | ICD-10-CM | POA: Insufficient documentation

## 2021-04-09 DIAGNOSIS — Z888 Allergy status to other drugs, medicaments and biological substances status: Secondary | ICD-10-CM | POA: Insufficient documentation

## 2021-04-09 DIAGNOSIS — E559 Vitamin D deficiency, unspecified: Secondary | ICD-10-CM | POA: Diagnosis not present

## 2021-04-09 DIAGNOSIS — G479 Sleep disorder, unspecified: Secondary | ICD-10-CM | POA: Diagnosis not present

## 2021-04-09 DIAGNOSIS — Z8261 Family history of arthritis: Secondary | ICD-10-CM | POA: Diagnosis not present

## 2021-04-09 DIAGNOSIS — Z885 Allergy status to narcotic agent status: Secondary | ICD-10-CM | POA: Diagnosis not present

## 2021-04-09 DIAGNOSIS — F32A Depression, unspecified: Secondary | ICD-10-CM | POA: Diagnosis not present

## 2021-04-09 DIAGNOSIS — R519 Headache, unspecified: Secondary | ICD-10-CM | POA: Diagnosis not present

## 2021-04-09 DIAGNOSIS — Z880 Allergy status to penicillin: Secondary | ICD-10-CM | POA: Insufficient documentation

## 2021-04-09 DIAGNOSIS — Z862 Personal history of diseases of the blood and blood-forming organs and certain disorders involving the immune mechanism: Secondary | ICD-10-CM | POA: Insufficient documentation

## 2021-04-09 DIAGNOSIS — Z7901 Long term (current) use of anticoagulants: Secondary | ICD-10-CM | POA: Diagnosis not present

## 2021-04-09 DIAGNOSIS — R42 Dizziness and giddiness: Secondary | ICD-10-CM | POA: Insufficient documentation

## 2021-04-09 DIAGNOSIS — M7989 Other specified soft tissue disorders: Secondary | ICD-10-CM | POA: Insufficient documentation

## 2021-04-09 DIAGNOSIS — I82532 Chronic embolism and thrombosis of left popliteal vein: Secondary | ICD-10-CM | POA: Diagnosis not present

## 2021-04-09 DIAGNOSIS — Z8542 Personal history of malignant neoplasm of other parts of uterus: Secondary | ICD-10-CM | POA: Diagnosis not present

## 2021-04-09 DIAGNOSIS — R32 Unspecified urinary incontinence: Secondary | ICD-10-CM | POA: Diagnosis not present

## 2021-04-09 DIAGNOSIS — R5382 Chronic fatigue, unspecified: Secondary | ICD-10-CM | POA: Diagnosis not present

## 2021-04-09 DIAGNOSIS — Z79899 Other long term (current) drug therapy: Secondary | ICD-10-CM | POA: Diagnosis not present

## 2021-04-09 DIAGNOSIS — R200 Anesthesia of skin: Secondary | ICD-10-CM | POA: Diagnosis not present

## 2021-04-09 DIAGNOSIS — Z833 Family history of diabetes mellitus: Secondary | ICD-10-CM | POA: Diagnosis not present

## 2021-04-09 DIAGNOSIS — Z818 Family history of other mental and behavioral disorders: Secondary | ICD-10-CM | POA: Insufficient documentation

## 2021-04-09 DIAGNOSIS — Z9049 Acquired absence of other specified parts of digestive tract: Secondary | ICD-10-CM | POA: Insufficient documentation

## 2021-04-09 DIAGNOSIS — D649 Anemia, unspecified: Secondary | ICD-10-CM

## 2021-04-09 LAB — CBC WITH DIFFERENTIAL/PLATELET
Abs Immature Granulocytes: 0.05 10*3/uL (ref 0.00–0.07)
Basophils Absolute: 0 10*3/uL (ref 0.0–0.1)
Basophils Relative: 1 %
Eosinophils Absolute: 0.2 10*3/uL (ref 0.0–0.5)
Eosinophils Relative: 2 %
HCT: 39.9 % (ref 36.0–46.0)
Hemoglobin: 12.4 g/dL (ref 12.0–15.0)
Immature Granulocytes: 1 %
Lymphocytes Relative: 20 %
Lymphs Abs: 1.4 10*3/uL (ref 0.7–4.0)
MCH: 30.7 pg (ref 26.0–34.0)
MCHC: 31.1 g/dL (ref 30.0–36.0)
MCV: 98.8 fL (ref 80.0–100.0)
Monocytes Absolute: 0.7 10*3/uL (ref 0.1–1.0)
Monocytes Relative: 10 %
Neutro Abs: 4.7 10*3/uL (ref 1.7–7.7)
Neutrophils Relative %: 66 %
Platelets: 199 10*3/uL (ref 150–400)
RBC: 4.04 MIL/uL (ref 3.87–5.11)
RDW: 14.5 % (ref 11.5–15.5)
WBC: 7.1 10*3/uL (ref 4.0–10.5)
nRBC: 0 % (ref 0.0–0.2)

## 2021-04-09 LAB — IRON AND TIBC
Iron: 91 ug/dL (ref 28–170)
Saturation Ratios: 25 % (ref 10.4–31.8)
TIBC: 360 ug/dL (ref 250–450)
UIBC: 269 ug/dL

## 2021-04-09 LAB — FERRITIN: Ferritin: 75 ng/mL (ref 11–307)

## 2021-04-09 LAB — D-DIMER, QUANTITATIVE: D-Dimer, Quant: 0.49 ug/mL-FEU (ref 0.00–0.50)

## 2021-04-09 LAB — VITAMIN D 25 HYDROXY (VIT D DEFICIENCY, FRACTURES): Vit D, 25-Hydroxy: 87.19 ng/mL (ref 30–100)

## 2021-04-15 ENCOUNTER — Telehealth: Payer: Self-pay

## 2021-04-15 DIAGNOSIS — I10 Essential (primary) hypertension: Secondary | ICD-10-CM

## 2021-04-15 DIAGNOSIS — Z9181 History of falling: Secondary | ICD-10-CM

## 2021-04-15 DIAGNOSIS — M1990 Unspecified osteoarthritis, unspecified site: Secondary | ICD-10-CM

## 2021-04-15 DIAGNOSIS — Z7901 Long term (current) use of anticoagulants: Secondary | ICD-10-CM

## 2021-04-15 DIAGNOSIS — I872 Venous insufficiency (chronic) (peripheral): Secondary | ICD-10-CM

## 2021-04-15 DIAGNOSIS — G7249 Other inflammatory and immune myopathies, not elsewhere classified: Secondary | ICD-10-CM

## 2021-04-15 DIAGNOSIS — L97921 Non-pressure chronic ulcer of unspecified part of left lower leg limited to breakdown of skin: Secondary | ICD-10-CM

## 2021-04-15 DIAGNOSIS — K5790 Diverticulosis of intestine, part unspecified, without perforation or abscess without bleeding: Secondary | ICD-10-CM

## 2021-04-15 DIAGNOSIS — Z7952 Long term (current) use of systemic steroids: Secondary | ICD-10-CM

## 2021-04-15 DIAGNOSIS — G629 Polyneuropathy, unspecified: Secondary | ICD-10-CM

## 2021-04-15 NOTE — Progress Notes (Signed)
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 88 y.o. 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  She was last seen in office by Tarri Abernethy PA-C on 10/07/2020.  At today's visit, she reports feeling fairly well.  No recent hospitalizations, surgeries, or changes in baseline health status.  She remains on Eliquis, which she is tolerating well without major bleeding events.  After her last visit in July 2022, she briefly had some episodes of maroon-colored stools, but this resolved on its own and has not recurred.  She denies any current symptoms of epistaxis, hematemesis, hematochezia, or melena.  She continues to have chronic bilateral leg swelling, with left greater than right likely due to post thrombotic changes.  She has had some blistering of her left leg, and is being followed by wound care.  She denies any shortness of breath, dyspnea on exertion, chest pain, cough, hemoptysis, and palpitations.  Regarding her history of endometrial cancer, she has not noticed any recent vaginal bleeding, abdominal pain, or B symptoms.    She has chronic fatigue, with energy at baseline of about 30%.  She has 70% appetite and reports that she feels she is maintaining a stable weight.   REVIEW OF SYSTEMS:  Review of Systems  Constitutional:  Positive for fatigue. Negative for appetite change, chills, diaphoresis, fever and unexpected weight change.  HENT:   Negative for lump/mass and nosebleeds.   Eyes:  Negative for eye problems.  Respiratory:  Negative for cough, hemoptysis and shortness of breath.   Cardiovascular:  Negative for chest pain,  leg swelling and palpitations.  Gastrointestinal:  Negative for abdominal pain, blood in stool, constipation, diarrhea, nausea and vomiting.  Genitourinary:  Positive for bladder incontinence. Negative for hematuria.   Skin: Negative.   Neurological:  Positive for dizziness, headaches and numbness. Negative for light-headedness.  Hematological:  Does not bruise/bleed easily.  Psychiatric/Behavioral:  Positive for depression and sleep disturbance. The patient is nervous/anxious.      PAST MEDICAL/SURGICAL HISTORY:  Past Medical History:  Diagnosis Date   Acute deep vein thrombosis (DVT) of left femoral vein ([facility]) 02/07/2019   Dx 02/05/2019   Arthritis    spinal stenosis   Deep vein thrombosis (DVT) of left lower extremity ([facility]) 08/04/2019   Elevated LFTs    secondary to fatty liver, negative work-up in 2011   Hypercholesterolemia    Hypertension    Lumbar spinal stenosis 01/14/2018   L3-4 and L4-5   Myopathy 02/15/2018   Osteoarthritis    right knee   Peripheral neuropathy 07/06/2018   PONV (postoperative nausea and vomiting)    Uterine cancer ([facility]) 08/2006   grade 1, no recurrence up to 2013   Past Surgical History:  Procedure Laterality Date   ABDOMINAL HYSTERECTOMY  2008   adenocarcinoma stage 1   APPENDECTOMY  1973   BREAST BIOPSY Left 2018   Benign   CHOLECYSTECTOMY  1973   COLONOSCOPY    06/21/2007   GBT:DVVOHY rectum/Sigmoid diverticula, diminutive hepatic flexure polyp s/p bx. Benign.    COLONOSCOPY N/A 10/17/2012   Dr. Oneida Alar- moderate diverticulosis was noted in the sigmoid colon, moderate sized internal hemorrhoids. next  tcs in10 years   ESOPHAGOGASTRODUODENOSCOPY N/A 03/20/2016   Dr. Oneida Alar, widely patent Schatzki ring, anemia due to ASA induced erosive gastritis, mild duodenitis. gastric bx benign without H.pylori.    FOOT SURGERY  2007   Pins in toes on left foot, 5 to 6 yrs ago   [place] N/A 03/20/2016   Procedure: GIVENS CAPSULE STUDY;   Surgeon: Danie Binder, MD;  Location: [facility];  Service: Endoscopy;  Laterality: N/A;   MUSCLE BIOPSY Left 06/07/2018   Procedure: LEFT THIGH MUSCLE BIOPSY;  Surgeon: Erroll Luna, MD;  Location: [facility];  Service: General;  Laterality: Left;   TONSILLECTOMY       SOCIAL HISTORY:  Social History   Socioeconomic History   Marital status: Widowed    Spouse name: Jeneen Rinks    Number of children: 3   Years of education: 12   Highest education level: 12th grade  Occupational History   Occupation: retired   Tobacco Use   Smoking status: Never   Smokeless tobacco: Never   Tobacco comments:    Never smoked  Vaping Use   Vaping Use: Never used  Substance and Sexual Activity   Alcohol use: No   Drug use: No   Sexual activity: Not Currently    Birth control/protection: Post-menopausal, Surgical  Other Topics Concern   Not on file  Social History Narrative   Husband Jeneen Rinks recently passed away   Caffeine use: soda daily   Right handed    Social Determinants of Health   Financial Resource Strain: Low Risk    Difficulty of Paying Living Expenses: Not hard at all  Food Insecurity: No Food Insecurity   Worried About Charity fundraiser in the Last Year: Never true   [place] in the Last Year: Never true  Transportation Needs: No Transportation Needs   Lack of Transportation (Medical): No   Lack of Transportation (Non-Medical): No  Physical Activity: Insufficiently Active   Days of Exercise per Week: 4 days   Minutes of Exercise per Session: 30 min  Stress: No Stress Concern Present   Feeling of Stress : Not at all  Social Connections: Socially Isolated   Frequency of Communication with Friends and Family: Three times a week   Frequency of Social Gatherings with Friends and Family: Three times a week   Attends Religious Services: Never   Active Member of Clubs or Organizations: No   Attends Archivist Meetings: Never   Marital Status:  Widowed  Human resources officer Violence: Not At Risk   Fear of Current or Ex-Partner: No   Emotionally Abused: No   Physically Abused: No   Sexually Abused: No    FAMILY HISTORY:  Family History  Problem Relation Age of Onset   Heart disease Father    Bladder Cancer Brother        in remission    Hypertension Sister    Dementia Sister    Diabetes type II Sister    Hypertension Sister    Aneurysm Sister    Hypertension Brother    Arthritis Brother    Hypertension Son    Colon cancer Neg Hx     CURRENT MEDICATIONS:  Outpatient Encounter Medications as of 04/16/2021  Medication Sig   acetaminophen (TYLENOL) 650 MG CR tablet Take 650 mg by mouth every 6 (six) hours.    apixaban (ELIQUIS) 5 MG TABS tablet Take 1 tablet (5 mg total) by mouth 2 (two) times  daily.   doxycycline (VIBRA-TABS) 100 MG tablet Take 1 tablet (100 mg total) by mouth 2 (two) times daily.   ergocalciferol (VITAMIN D2) 1.25 MG (50000 UT) capsule Take 1 capsule (50,000 Units total) by mouth once a week.   ezetimibe (ZETIA) 10 MG tablet Take 1 tablet (10 mg total) by mouth daily.   folic acid (FOLVITE) 1 MG tablet TAKE 1 TABLET EVERY DAY   furosemide (LASIX) 20 MG tablet Take one tablet by mouth every Tuesday, Thursday and Saturday for 2 weeks   gabapentin (NEURONTIN) 100 MG capsule Take one capsule by mouth every morning and take two capsules at bedtime   magnesium oxide (MAG-OX) 400 (240 Mg) MG tablet TAKE 1 TABLET TWICE DAILY   methotrexate (RHEUMATREX) 2.5 MG tablet Take 6 tablets (15 mg total) by mouth once a week. Caution:Chemotherapy. Protect from light.   NON FORMULARY Fluid restriction of 1.8 liters per day (1718m total) 7-3 = 800 ml 3-11 = 800 ml 11-7 = 175 ml Document totals qshift. Every Shift Day, Evening, Nigh   NON FORMULARY Diet: _____ Regular, __x____ NAS, _______Consistent Carbohydrate, _______NPO _____Other   potassium chloride (KLOR-CON M) 10 MEQ tablet Take two tablets by mouth every  Tuesday, Thursday and Saturday for 2 weeks only   predniSONE (DELTASONE) 5 MG tablet TAKE 2 TABLETS (10 MG TOTAL) BY MOUTH DAILY WITH BREAKFAST.   triamterene-hydrochlorothiazide (MAXZIDE-25) 37.5-25 MG tablet TAKE 1 TABLET EVERY DAY (DOSE REDUCTION)   No facility-administered encounter medications on file as of 04/16/2021.    ALLERGIES:  Allergies  Allergen Reactions   Cellcept [Mycophenolate Mofetil]     Muscle cramps, mouth soreness, difficulty swallowing, bruising   Statins     Dx of in   Ciprofloxacin Hcl Other (See Comments)    Chills, sick, could not tolerate it   Hydrocodone Nausea Only   Penicillins Rash     PHYSICAL EXAM:  ECOG PERFORMANCE STATUS: 2 - Symptomatic, <50% confined to bed  There were no vitals filed for this visit. There were no vitals filed for this visit. Physical Exam Constitutional:      Appearance: Normal appearance.  HENT:     Head: Normocephalic and atraumatic.     Mouth/Throat:     Mouth: Mucous membranes are moist.  Eyes:     Extraocular Movements: Extraocular movements intact.     Pupils: Pupils are equal, round, and reactive to light.  Cardiovascular:     Rate and Rhythm: Normal rate and regular rhythm.     Pulses: Normal pulses.     Heart sounds: Normal heart sounds.  Pulmonary:     Effort: Pulmonary effort is normal.     Breath sounds: Normal breath sounds.  Abdominal:     General: Bowel sounds are normal.     Palpations: Abdomen is soft.     Tenderness: There is no abdominal tenderness.  Musculoskeletal:        General: No swelling.     Right lower leg: Edema (2+) present.     Left lower leg: Edema (2+, compression wrap in place) present.  Lymphadenopathy:     Cervical: No cervical adenopathy.  Skin:    General: Skin is warm and dry.  Neurological:     General: No focal deficit present.     Mental Status: She is alert and oriented to person, place, and time.  Psychiatric:        Mood and Affect: Mood normal.         Behavior: Behavior  normal.     LABORATORY DATA:  I have reviewed the labs as listed.  CBC    Component Value Date/Time   WBC 7.1 04/09/2021 1016   RBC 4.04 04/09/2021 1016   HGB 12.4 04/09/2021 1016   HGB 12.6 03/25/2021 1218   HCT 39.9 04/09/2021 1016   HCT 37.8 03/25/2021 1218   PLT 199 04/09/2021 1016   PLT 223 03/25/2021 1218   MCV 98.8 04/09/2021 1016   MCV 93 03/25/2021 1218   MCH 30.7 04/09/2021 1016   MCHC 31.1 04/09/2021 1016   RDW 14.5 04/09/2021 1016   RDW 14.5 03/25/2021 1218   LYMPHSABS 1.4 04/09/2021 1016   LYMPHSABS 0.7 03/25/2021 1218   MONOABS 0.7 04/09/2021 1016   EOSABS 0.2 04/09/2021 1016   EOSABS 0.1 03/25/2021 1218   BASOSABS 0.0 04/09/2021 1016   BASOSABS 0.0 03/25/2021 1218   CMP Latest Ref Rng & Units 03/25/2021 10/10/2020 09/30/2020  Glucose 70 - 99 mg/dL 88 94 106(H)  BUN 8 - 27 mg/dL 35(H) 34(H) 27(H)  Creatinine 0.57 - 1.00 mg/dL 0.98 0.82 0.82  Sodium 134 - 144 mmol/L 144 141 139  Potassium 3.5 - 5.2 mmol/L 3.8 3.3(L) 3.7  Chloride 96 - 106 mmol/L 103 103 102  CO2 20 - 29 mmol/L 27 26 29   Calcium 8.7 - 10.3 mg/dL 9.6 9.2 8.8(L)  Total Protein 6.0 - 8.5 g/dL 5.9(L) 6.1(L) 6.2(L)  Total Bilirubin 0.0 - 1.2 mg/dL 0.4 0.9 0.8  Alkaline Phos 44 - 121 IU/L 56 37(L) 40  AST 0 - 40 IU/L 26 30 28   ALT 0 - 32 IU/L 19 23 22     DIAGNOSTIC IMAGING:  I have independently reviewed the relevant imaging and discussed with the patient.  ASSESSMENT & PLAN: 1.  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 - Repeat ultrasound on 08/05/2019 showed occlusive DVT involving left popliteal vein with extension to left posterior tibial vein and one of the paired left peroneal veins similar to 02/10/2019 imaging  study - Ultrasound on 08/23/2019 showed no evidence of acute or chronic DVT within either lower extremity, with special attention to left popliteal and tibial veins.  There was noted to be a 16.6 x 3.9 x 3.5 cm complex fluid collection extending from the left popliteal fossa to the proximal mid aspect of the left calf, which was not seen on 08/05/2019 (this was found to be hemarthrosis of the left knee joint, s/p aspiration and Depo-Medrol injection by Dr. Aline Brochure on 09/08/2019 with 40 cc of aspirated blood) - Due to hemarthrosis described above, Eliquis was discontinued again on 09/05/2019 - Bilateral lower extremity ultrasound on 09/18/2019 was negative for DVT - Bilateral venous ultrasound (11/13/2019): New partially occlusive thrombus in the left popliteal vein - Patient restarted on Eliquis 11/13/2019 - Left lower extremity venous US (03/08/2020): Resolution of left lower extremity DVT - Most recent D-dimer (04/09/2021): Normal at 0.49; no current symptoms of recurrent DVT    - At this juncture, would recommend lifelong anticoagulation, as patient has had recurrent left lower extremity DVTs each time that Eliquis has been discontinued - Patient currently taking Eliquis 5 mg twice daily, tolerating well without any symptoms of blood loss.  She did report some maroon-colored stools in July 2022, but these resolved on their own  and have not recurred. - PLAN: Continue Eliquis indefinitely.  Repeat D-dimer and CBC at follow-up in 6 months.  Patient educated on alarm symptoms that would prompt immediate medical attention.   2.  Normocytic anemia, resolved - Likely anemia of chronic disease - Renal function stable without obvious signs of renal dysfunction - Patient was instructed to stop taking iron tablet by her PCP - Most recent CBC (04/09/2021): No current anemia noted, Hgb 12.4; normal iron panel with ferritin 75 and iron saturation 25% - PLAN:  Repeat CBC and iron panel at follow-up in 6 months.   3.   Vitamin D deficiency - Most recent vitamin D (04/09/2021): 87.19 - She is taking vitamin D 50,000 units weekly   - PLAN: We will STOP ergocalciferol 50,000 units weekly. - Instead, we will START patient on vitamin D 1000 units daily supplement   - Repeat vitamin D level at next visit   4.  History of endometrial adenocarcinoma - She had T1b N0 well-differentiated endometrioid adenocarcinoma, FIGO grade 1, TAH/BSO with lymphadenectomy done on 12/14/2006.  She did not require any adjuvant therapy. CT scan on 03/06/2019 did not show any evidence of recurrence or metastatic disease - Denies vaginal bleeding, abdominal pain, B symptoms, unintentional weight loss     PLAN SUMMARY & DISPOSITION: Labs in 6 months Office visit after labs  All questions were answered. The patient knows to call the clinic with any problems, questions or concerns.  Medical decision making: Moderate  Time spent on visit: I spent 20 minutes counseling the patient face to face. The total time spent in the appointment was 30 minutes and more than 50% was on counseling.   Harriett Rush, PA-C  04/16/2021 10:53 AM

## 2021-04-15 NOTE — Telephone Encounter (Signed)
[name] aware and verbal ok given

## 2021-04-15 NOTE — Telephone Encounter (Signed)
[name] from [facility] is treating Juliahna and said her right lower leg now has 6 small water blisters that have not yet burst and wants the verbal ok to start prophylacticly treating that leg like the other leg with the ulna boot so it doesn't end up like the left leg.  [name] at [facility]

## 2021-04-16 ENCOUNTER — Inpatient Hospital Stay (HOSPITAL_COMMUNITY): Payer: Medicare HMO | Admitting: Physician Assistant

## 2021-04-16 ENCOUNTER — Other Ambulatory Visit: Payer: Self-pay

## 2021-04-16 VITALS — BP 121/71 | HR 80 | Temp 97.0°F | Resp 20

## 2021-04-16 DIAGNOSIS — F32A Depression, unspecified: Secondary | ICD-10-CM | POA: Diagnosis not present

## 2021-04-16 DIAGNOSIS — R42 Dizziness and giddiness: Secondary | ICD-10-CM | POA: Diagnosis not present

## 2021-04-16 DIAGNOSIS — D649 Anemia, unspecified: Secondary | ICD-10-CM | POA: Diagnosis not present

## 2021-04-16 DIAGNOSIS — R5382 Chronic fatigue, unspecified: Secondary | ICD-10-CM | POA: Diagnosis not present

## 2021-04-16 DIAGNOSIS — I82532 Chronic embolism and thrombosis of left popliteal vein: Secondary | ICD-10-CM | POA: Diagnosis not present

## 2021-04-16 DIAGNOSIS — E559 Vitamin D deficiency, unspecified: Secondary | ICD-10-CM

## 2021-04-16 DIAGNOSIS — M7989 Other specified soft tissue disorders: Secondary | ICD-10-CM | POA: Diagnosis not present

## 2021-04-16 DIAGNOSIS — R519 Headache, unspecified: Secondary | ICD-10-CM | POA: Diagnosis not present

## 2021-04-16 DIAGNOSIS — R32 Unspecified urinary incontinence: Secondary | ICD-10-CM | POA: Diagnosis not present

## 2021-04-16 DIAGNOSIS — G479 Sleep disorder, unspecified: Secondary | ICD-10-CM | POA: Diagnosis not present

## 2021-04-16 DIAGNOSIS — R200 Anesthesia of skin: Secondary | ICD-10-CM | POA: Diagnosis not present

## 2021-04-16 MED ORDER — VITAMIN D 25 MCG (1000 UNIT) PO TABS: 1000.0000 [IU] | ORAL_TABLET | Freq: Every day | ORAL | 3 refills | Status: DC

## 2021-04-16 NOTE — Patient Instructions (Signed)
[place] at [facility] Discharge Instructions  You were seen today by Tarri Abernethy PA-C for your anemia and history of blood clots.  Your blood and iron levels look great at this time, no need for further iron supplementation for now.    We will decrease the dose of your Vitamin D.  You can STOP taking the weekly high-dose Vitamin D.  Instead, you should START taking Vitamin D 1,000 units daily.  (This is available over the counter.)  Please seek [facility] if you notice any signs of bleeding while taking Eliquis, or have any symptoms of a blood clot in your legs or lungs.  LABS: Return in 6 months for repeat labs  OTHER TESTS: None  MEDICATIONS: Continue Eliquis 5 mg twice a day for your blood clots  FOLLOW-UP APPOINTMENT: Office visit in 6 months   Thank you for choosing [place] at [facility] to provide your oncology and hematology care.  To afford each patient quality time with our provider, please arrive at least 15 minutes before your scheduled appointment time.   If you have a lab appointment with the [facility] please come in thru the Main Entrance and check in at the main information desk.  You need to re-schedule your appointment should you arrive 10 or more minutes late.  We strive to give you quality time with our providers, and arriving late affects you and other patients whose appointments are after yours.  Also, if you no show three or more times for appointments you may be dismissed from the clinic at the providers discretion.     Again, thank you for choosing [facility].  Our hope is that these requests will decrease the amount of time that you wait before being seen by our physicians.       _____________________________________________________________  Should you have questions after your visit to [facility], please contact our office at [phone] and follow the  prompts.  Our office hours are 8:00 a.m. and 4:30 p.m. Monday - Friday.  Please note that voicemails left after 4:00 p.m. may not be returned until the following business day.  We are closed weekends and major holidays.  You do have access to a nurse 24-7, just call the main number to the clinic [phone] and do not press any options, hold on the line and a nurse will answer the phone.    For prescription refill requests, have your pharmacy contact our office and allow 72 hours.    Due to Covid, you will need to wear a mask upon entering the hospital. If you do not have a mask, a mask will be given to you at the Main Entrance upon arrival. For doctor visits, patients may have 1 support person age 40 or older with them. For treatment visits, patients can not have anyone with them due to social distancing guidelines and our immunocompromised population.

## 2021-04-17 ENCOUNTER — Ambulatory Visit: Payer: Medicare HMO | Admitting: Family Medicine

## 2021-04-24 ENCOUNTER — Ambulatory Visit: Payer: Medicare HMO | Admitting: Urology

## 2021-04-25 DIAGNOSIS — K5790 Diverticulosis of intestine, part unspecified, without perforation or abscess without bleeding: Secondary | ICD-10-CM

## 2021-04-25 DIAGNOSIS — I10 Essential (primary) hypertension: Secondary | ICD-10-CM | POA: Diagnosis not present

## 2021-04-25 DIAGNOSIS — Z9181 History of falling: Secondary | ICD-10-CM

## 2021-04-25 DIAGNOSIS — M1990 Unspecified osteoarthritis, unspecified site: Secondary | ICD-10-CM

## 2021-04-25 DIAGNOSIS — Z7952 Long term (current) use of systemic steroids: Secondary | ICD-10-CM

## 2021-04-25 DIAGNOSIS — L97921 Non-pressure chronic ulcer of unspecified part of left lower leg limited to breakdown of skin: Secondary | ICD-10-CM | POA: Diagnosis not present

## 2021-04-25 DIAGNOSIS — G629 Polyneuropathy, unspecified: Secondary | ICD-10-CM

## 2021-04-25 DIAGNOSIS — I872 Venous insufficiency (chronic) (peripheral): Secondary | ICD-10-CM | POA: Diagnosis not present

## 2021-04-25 DIAGNOSIS — Z7901 Long term (current) use of anticoagulants: Secondary | ICD-10-CM

## 2021-04-25 DIAGNOSIS — G7249 Other inflammatory and immune myopathies, not elsewhere classified: Secondary | ICD-10-CM | POA: Diagnosis not present

## 2021-04-28 ENCOUNTER — Other Ambulatory Visit: Payer: Self-pay | Admitting: Neurology

## 2021-04-28 DIAGNOSIS — G7249 Other inflammatory and immune myopathies, not elsewhere classified: Secondary | ICD-10-CM

## 2021-04-30 ENCOUNTER — Ambulatory Visit: Payer: Medicare HMO | Admitting: Family Medicine

## 2021-05-01 ENCOUNTER — Encounter: Payer: Self-pay | Admitting: Family Medicine

## 2021-05-01 ENCOUNTER — Other Ambulatory Visit: Payer: Self-pay

## 2021-05-01 ENCOUNTER — Ambulatory Visit (INDEPENDENT_AMBULATORY_CARE_PROVIDER_SITE_OTHER): Payer: Medicare HMO | Admitting: Family Medicine

## 2021-05-01 VITALS — BP 113/63 | HR 73 | Resp 16 | Ht 66.0 in

## 2021-05-01 DIAGNOSIS — M48061 Spinal stenosis, lumbar region without neurogenic claudication: Secondary | ICD-10-CM | POA: Diagnosis not present

## 2021-05-01 DIAGNOSIS — G7249 Other inflammatory and immune myopathies, not elsewhere classified: Secondary | ICD-10-CM

## 2021-05-01 DIAGNOSIS — L97825 Non-pressure chronic ulcer of other part of left lower leg with muscle involvement without evidence of necrosis: Secondary | ICD-10-CM

## 2021-05-01 DIAGNOSIS — B028 Zoster with other complications: Secondary | ICD-10-CM

## 2021-05-01 DIAGNOSIS — L309 Dermatitis, unspecified: Secondary | ICD-10-CM | POA: Diagnosis not present

## 2021-05-01 DIAGNOSIS — I10 Essential (primary) hypertension: Secondary | ICD-10-CM

## 2021-05-01 DIAGNOSIS — R252 Cramp and spasm: Secondary | ICD-10-CM | POA: Diagnosis not present

## 2021-05-01 DIAGNOSIS — E785 Hyperlipidemia, unspecified: Secondary | ICD-10-CM | POA: Diagnosis not present

## 2021-05-01 DIAGNOSIS — I872 Venous insufficiency (chronic) (peripheral): Secondary | ICD-10-CM | POA: Diagnosis not present

## 2021-05-01 MED ORDER — HYDROCORTISONE 0.5 % EX CREA: TOPICAL_CREAM | CUTANEOUS | 0 refills | Status: DC

## 2021-05-01 MED ORDER — ACYCLOVIR 800 MG PO TABS
800.0000 mg | ORAL_TABLET | Freq: Three times a day (TID) | ORAL | 0 refills | Status: DC
Start: 2021-05-01 — End: 2021-07-31

## 2021-05-01 NOTE — Patient Instructions (Signed)
F/u in 4 months, call if you need me sooner  Reduce vit D to one tablet 3 days per week, Monday, Wednesday and Friday  1 week of tablets is prescribed for shingles pain, and hydrocortisone cream prescribed for limited use onn rash  Lipid, TSH and magnesium level today  Careful not to fall  Thankful that ulcers are improving   Thanks for choosing [facility], we consider it a privelige to serve you.

## 2021-05-01 NOTE — Assessment & Plan Note (Signed)
Topical low dose hydrocortisone twice daily for 5 days, then , as needed

## 2021-05-01 NOTE — Assessment & Plan Note (Signed)
Continue daily gabapentin s before

## 2021-05-01 NOTE — Assessment & Plan Note (Signed)
Controlled, no change in medication DASH diet and commitment to daily physical activity for a minimum of 30 minutes discussed and encouraged, as a part of hypertension management. The importance of attaining a healthy weight is also discussed.  BP/Weight 05/01/2021 04/16/2021 03/25/2021 01/02/2021 11/20/2020 9/82/8675 03/31/8240  Systolic BP 998 069 996 722 773 750 510  Diastolic BP 63 71 75 68 72 73 74  Wt. (Lbs) - - - - 150 - -  BMI 24.21 - 24.96 - 24.96 - 25.79

## 2021-05-01 NOTE — Assessment & Plan Note (Signed)
bilateral , having in home PT twice weekly for management , both legs are wrapped in compression bandages

## 2021-05-01 NOTE — Assessment & Plan Note (Signed)
Reports increased right jaw pain, 1 week antiviral prescribed

## 2021-05-01 NOTE — Assessment & Plan Note (Signed)
Continue daily predniosne

## 2021-05-01 NOTE — Progress Notes (Signed)
° °  [name] [name]     MRN: [id number]      DOB: [birth year]   HPI [name] [name] is here for follow up and re-evaluation of chronic medical conditions, medication management and review of any available recent lab and radiology data.  Preventive health is updated, specifically  Cancer screening and Immunization.   Questions or concerns regarding consultations or procedures which the PT has had in the interim are  addressed. The PT denies any adverse reactions to current medications since the last visit.  There are no new concerns.  There are no specific complaints   ROS Denies recent fever or chills. Denies sinus pressure, nasal congestion, ear pain or sore throat. Denies chest congestion, productive cough or wheezing. Denies chest pains, palpitations and leg swelling Denies abdominal pain, nausea, vomiting,diarrhea or constipation.   Denies dysuria, frequency, hesitancy or incontinence. Denies joint pain, swelling and limitation in mobility. Denies headaches, seizures, numbness, or tingling. Denies depression, anxiety or insomnia C/o rash, redness and pain  over right cheek where shingles started 1 year ago New right leg ulcer and left leg ulcer improving, has both legs bandaged  PE  BP 113/63    Pulse 73    Resp 16    Ht 5' 6"  (1.676 m)    SpO2 96%    BMI 24.21 kg/m   Patient alert and oriented and in no cardiopulmonary distress.  HEENT: No facial asymmetry, EOMI,     Neck decreased ROM.  Chest: Clear to auscultation bilaterally.  CVS: S1, S2 no murmurs, no S3.Regular rate.  ABD: Soft non tender.     MS: markedly decreased  ROM spine, shoulders, hips and knees.  Skin: Intact, erythema and tenderness over right jaw in TMJ area, no purulent drainage  Psych: Good eye contact, normal affect. Memory intact not anxious or depressed appearing.  CNS: CN 2-12 intact, power,  normal throughout.no focal deficits noted.   Assessment & Plan  Venous stasis ulcers (HCC) bilateral ,  having in home PT twice weekly for management , both legs are wrapped in compression bandages  Zoster Reports increased right jaw pain, 1 week antiviral prescribed  Essential hypertension Controlled, no change in medication DASH diet and commitment to daily physical activity for a minimum of 30 minutes discussed and encouraged, as a part of hypertension management. The importance of attaining a healthy weight is also discussed.  BP/Weight 05/01/2021 04/16/2021 03/25/2021 01/02/2021 11/20/2020 09/13/7626 05/22/5174  Systolic BP 160 737 106 269 485 462 703  Diastolic BP 63 71 75 68 72 73 74  Wt. (Lbs) - - - - 150 - -  BMI 24.21 - 24.96 - 24.96 - 25.79       Inflammatory myopathy Continue daily predniosne  Lumbar spinal stenosis Continue daily gabapentin s before  Dermatitis Topical low dose hydrocortisone twice daily for 5 days, then , as needed

## 2021-05-02 LAB — LIPID PANEL
Chol/HDL Ratio: 1.8 ratio (ref 0.0–4.4)
Cholesterol, Total: 195 mg/dL (ref 100–199)
HDL: 109 mg/dL (ref >39)
LDL Chol Calc (NIH): 74 mg/dL (ref 0–99)
Triglycerides: 64 mg/dL (ref 0–149)
VLDL Cholesterol Cal: 12 mg/dL (ref 5–40)

## 2021-05-02 LAB — TSH: TSH: 3.14 u[IU]/mL (ref 0.450–4.500)

## 2021-05-02 LAB — MAGNESIUM: Magnesium: 2.3 mg/dL (ref 1.6–2.3)

## 2021-05-05 ENCOUNTER — Telehealth: Payer: Self-pay | Admitting: Family Medicine

## 2021-05-05 ENCOUNTER — Other Ambulatory Visit: Payer: Self-pay

## 2021-05-05 ENCOUNTER — Encounter: Payer: Self-pay | Admitting: Neurology

## 2021-05-05 ENCOUNTER — Ambulatory Visit: Payer: Medicare HMO | Admitting: Neurology

## 2021-05-05 VITALS — BP 138/78 | HR 84 | Ht 66.0 in

## 2021-05-05 DIAGNOSIS — R269 Unspecified abnormalities of gait and mobility: Secondary | ICD-10-CM | POA: Diagnosis not present

## 2021-05-05 DIAGNOSIS — R202 Paresthesia of skin: Secondary | ICD-10-CM | POA: Diagnosis not present

## 2021-05-05 DIAGNOSIS — R799 Abnormal finding of blood chemistry, unspecified: Secondary | ICD-10-CM | POA: Diagnosis not present

## 2021-05-05 DIAGNOSIS — R748 Abnormal levels of other serum enzymes: Secondary | ICD-10-CM | POA: Diagnosis not present

## 2021-05-05 DIAGNOSIS — G7249 Other inflammatory and immune myopathies, not elsewhere classified: Secondary | ICD-10-CM

## 2021-05-05 DIAGNOSIS — R7989 Other specified abnormal findings of blood chemistry: Secondary | ICD-10-CM | POA: Diagnosis not present

## 2021-05-05 DIAGNOSIS — R7309 Other abnormal glucose: Secondary | ICD-10-CM | POA: Diagnosis not present

## 2021-05-05 MED ORDER — PREDNISONE 5 MG PO TABS: 10.0000 mg | ORAL_TABLET | Freq: Every day | ORAL | 3 refills | Status: DC

## 2021-05-05 MED ORDER — METHOTREXATE SODIUM 2.5 MG PO TABS: 15.0000 mg | ORAL_TABLET | ORAL | 11 refills | Status: DC

## 2021-05-05 NOTE — Progress Notes (Signed)
Chief Complaint  Patient presents with   Follow-up    Rm 14. Alone. Pt denies any new myopathy symptoms. No new weakness reported.      ASSESSMENT AND PLAN  [name] [name] is a 88 y.o. 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   MEDICAL HISTORY:  [name] [name], is a 88 year old female, with history of inflammatory myositis, she was seen by Dr. Jannifer Franklin in the past, [name], [place] [facility],   I reviewed and summarized the referring note. PMHX  She was seen by Dr. Jannifer Franklin since October 2019, complains of gradual onset of progressive weakness of both neck since 2016, getting worse in 2019 has to rely on her walker, with occasional falls,  She had long history of chronic low back pain, bladder incontinence, following her uterine surgery  She also has a history of fatty liver, was on statin treatment  EMG nerve conduction study on February 15, 2018 showed evidence of mild axonal sensorimotor polyneuropathy, EMG showed myopathic changes  Laboratory evaluation showed elevated CPK 1030,  Muscle biopsy of left thigh in March 2020 showed inflammatory myopathy with mild necrosis  From reviewing history, she was treated with very low-dose of steroid on March 14, 2020, along with methotrexate since then  She is on prednisone 10 mg daily now and also methotrexate 2.5 mg 6 tablets every week, she continue complains of gait abnormality  PHYSICAL EXAM:   Vitals:   05/05/21 1040  BP: 138/78  Pulse: 84  Height: 5' 6"  (1.676 m)   Not recorded     Body mass index is 24.21 kg/m.  PHYSICAL EXAMNIATION:  Gen: NAD, conversant, well nourised, well  groomed                     Cardiovascular: Regular rate rhythm, no peripheral edema, warm, nontender. Eyes: Conjunctivae clear without exudates or hemorrhage Neck: Supple, no carotid bruits. Pulmonary: Clear to auscultation bilaterally   NEUROLOGICAL EXAM:  MENTAL STATUS: Speech/cognition: Awake, alert, oriented to history taking and casual conversation,   CRANIAL NERVES: CN II: Visual fields are full to confrontation. Pupils are round equal and briskly reactive to light. CN III, IV, VI: extraocular movement are normal. No ptosis. CN V: Facial sensation is intact to light touch CN VII: Face is symmetric with normal eye closure  CN VIII: Hearing is normal to causal conversation. CN IX, X: Phonation is normal. CN XI: Head turning and shoulder shrug are intact  MOTOR: Mild bilateral shoulder abduction, external rotation weakness, mild bilateral hip flexion weakness  REFLEXES: Reflexes are 2+ and symmetric at the biceps, triceps, knees, and ankles. Plantar responses are flexor.  SENSORY: Intact to light touch, pinprick and vibratory sensation are intact in fingers and toes.  COORDINATION: There is no trunk or limb dysmetria noted.  GAIT/STANCE: She needs assistance to get up from seated position, difficulty initiate gait  REVIEW OF SYSTEMS:  Full 14 system review of systems performed and notable only for as above All other review of systems were negative.   ALLERGIES: Allergies  Allergen Reactions   Cellcept [Mycophenolate Mofetil]     Muscle cramps, mouth soreness, difficulty swallowing, bruising   Statins  Dx of in   Ciprofloxacin Hcl Other (See Comments)    Chills, sick, could not tolerate it   Hydrocodone Nausea Only   Penicillins Rash    HOME MEDICATIONS: Current Outpatient Medications  Medication Sig Dispense Refill   acetaminophen (TYLENOL) 650 MG CR tablet Take 650 mg by mouth every 6 (six) hours.     acyclovir (ZOVIRAX) 800 MG tablet Take 1 tablet (800  mg total) by mouth 3 (three) times daily. 21 tablet 0   apixaban (ELIQUIS) 5 MG TABS tablet Take 1 tablet (5 mg total) by mouth 2 (two) times daily. 180 tablet 3   cholecalciferol (VITAMIN D3) 25 MCG (1000 UNIT) tablet Take 1 tablet (1,000 Units total) by mouth daily. 90 tablet 3   ezetimibe (ZETIA) 10 MG tablet Take 1 tablet (10 mg total) by mouth daily. 90 tablet 1   folic acid (FOLVITE) 1 MG tablet TAKE 1 TABLET EVERY DAY 90 tablet 3   gabapentin (NEURONTIN) 100 MG capsule Take one capsule by mouth every morning and take two capsules at bedtime 270 capsule 1   hydrocortisone cream 0.5 % Apply spaingly twice daily to rash on right cheek for 5 days, then as needed 30 g 0   magnesium oxide (MAG-OX) 400 (240 Mg) MG tablet TAKE 1 TABLET TWICE DAILY 180 tablet 2   methotrexate 2.5 MG tablet TAKE 6 TABLETS ONE TIME WEEKLY - CAUTION: CHEMOTHERAPY. PROTECT FROM LIGHT. 24 tablet 0   NON FORMULARY Fluid restriction of 1.8 liters per day (1730m total) 7-3 = 800 ml 3-11 = 800 ml 11-7 = 175 ml Document totals qshift. Every Shift Day, Evening, Nigh     NON FORMULARY Diet: _____ Regular, __x____ NAS, _______Consistent Carbohydrate, _______NPO _____Other     predniSONE (DELTASONE) 5 MG tablet TAKE 2 TABLETS (10 MG TOTAL) BY MOUTH DAILY WITH BREAKFAST. 180 tablet 0   triamterene-hydrochlorothiazide (MAXZIDE-25) 37.5-25 MG tablet TAKE 1 TABLET EVERY DAY (DOSE REDUCTION) 90 tablet 3   No current facility-administered medications for this visit.    PAST MEDICAL HISTORY: Past Medical History:  Diagnosis Date   Acute deep vein thrombosis (DVT) [place] ([place] 02/07[place]   Dx 02/05/2019   Arthritis    spinal stenosis   [place] ([place]) of [place] lower [place] ([place] 08/04[place]   Elevated LFTs    secondary to fatty liver, negative work-up in 2011   Hypercholesterolemia    Hypertension    Lumbar spinal stenosis 01/14/2018   L3-4 and L4-5   Myopathy 02/15/2018   Osteoarthritis     right knee   Peripheral neuropathy 07/06/2018   PONV (postoperative nausea and vomiting)    Uterine cancer ([facility] 08/2006   grade 1, no recurrence up to 2013    PAST SURGICAL HISTORY: Past Surgical History:  Procedure Laterality Date   ABDOMINAL HYSTERECTOMY  2008   adenocarcinoma stage 1   APPENDECTOMY  1973   BREAST BIOPSY Left 2018   Benign   CHOLECYSTECTOMY  1973   COLONOSCOPY    06/21/2007   RDJT:TSVXBLrectum/Sigmoid diverticula, diminutive hepatic flexure polyp s/p bx. Benign.    COLONOSCOPY N/A 10/17/2012   Dr. FOneida Alar moderate diverticulosis was noted in the sigmoid colon, moderate sized internal hemorrhoids. next tcs in10 years   ESOPHAGOGASTRODUODENOSCOPY N/A 03/20/2016   Dr. FOneida Alar widely patent Schatzki ring, anemia due to ASA induced erosive gastritis, mild duodenitis. gastric bx benign without H.pylori.    FOOT SURGERY  2007   Pins in toes on left foot,  5 to 6 yrs ago   GIVENS CAPSULE STUDY N/A 03/20/2016   Procedure: GIVENS CAPSULE STUDY;  Surgeon: Danie Binder, MD;  [facility];  Service: Endoscopy;  Laterality: N/A;   MUSCLE BIOPSY Left 06/07/2018   Procedure: LEFT THIGH MUSCLE BIOPSY;  Surgeon: Erroll Luna, MD;  Location: [facility];  Service: General;  Laterality: Left;   TONSILLECTOMY      FAMILY HISTORY: Family History  Problem Relation Age of Onset   Heart disease Father    Bladder Cancer Brother        in remission    Hypertension Sister    Dementia Sister    Diabetes type II Sister    Hypertension Sister    Aneurysm Sister    Hypertension Brother    Arthritis Brother    Hypertension Son    Colon cancer Neg Hx     SOCIAL HISTORY: Social History   Socioeconomic History   Marital status: Widowed    Spouse name: Jeneen Rinks    Number of children: 3   Years of education: 12   Highest education level: 12th grade  Occupational History   Occupation: retired   Tobacco Use   Smoking status: Never   Smokeless  tobacco: Never   Tobacco comments:    Never smoked  Vaping Use   Vaping Use: Never used  Substance and Sexual Activity   Alcohol use: No   Drug use: No   Sexual activity: Not Currently    Birth control/protection: Post-menopausal, Surgical  Other Topics Concern   Not on file  Social History Narrative   Husband Jeneen Rinks recently passed away   Caffeine use: soda daily   Right handed    Social Determinants of Health   Financial Resource Strain: Low Risk    Difficulty of Paying Living Expenses: Not hard at all  Food Insecurity: No Food Insecurity   Worried About Charity fundraiser in the Last Year: Never true   [place] in the Last Year: Never true  Transportation Needs: No Transportation Needs   Lack of Transportation (Medical): No   Lack of Transportation (Non-Medical): No  Physical Activity: Insufficiently Active   Days of Exercise per Week: 4 days   Minutes of Exercise per Session: 30 min  Stress: No Stress Concern Present   Feeling of Stress : Not at all  Social Connections: Socially Isolated   Frequency of Communication with Friends and Family: Three times a week   Frequency of Social Gatherings with Friends and Family: Three times a week   Attends Religious Services: Never   Active Member of Clubs or Organizations: No   Attends Archivist Meetings: Never   Marital Status: Widowed  Intimate Partner Violence: Not At Risk   Fear of Current or Ex-Partner: No   Emotionally Abused: No   Physically Abused: No   Sexually Abused: No      Marcial Pacas, M.D. Ph.D.  [facility] [address], [place], [place] [address] Ph: [phone] Fax: [phone]  CC:  Fayrene Helper, MD [address], [address] [place],  [place] [address]  Fayrene Helper, MD

## 2021-05-05 NOTE — Telephone Encounter (Signed)
Patient aware of lab results.

## 2021-05-05 NOTE — Telephone Encounter (Signed)
Pt returning call

## 2021-05-06 ENCOUNTER — Telehealth: Payer: Self-pay | Admitting: *Deleted

## 2021-05-06 LAB — TSH: TSH: 2.82 u[IU]/mL (ref 0.450–4.500)

## 2021-05-06 LAB — CK: Total CK: 145 U/L (ref 26–161)

## 2021-05-06 LAB — HGB A1C W/O EAG: Hgb A1c MFr Bld: 5.9 % — ABNORMAL HIGH (ref 4.8–5.6)

## 2021-05-06 NOTE — Telephone Encounter (Signed)
I called patient. I discussed her lab results and recommendations. Pt verbalized understanding of results. Pt had no questions at this time but was encouraged to call back if questions arise.

## 2021-05-06 NOTE — Telephone Encounter (Signed)
-----   Message from Marcial Pacas, MD sent at 05/06/2021 11:05 AM EST ----- Please call patient, laboratory evaluation showed  Slight elevated A1c 5.9, which is about her baseline level, she should continue exercise, diet control. Rest of the laboratory evaluation showed no significant abnormalities.

## 2021-06-02 ENCOUNTER — Other Ambulatory Visit: Payer: Self-pay | Admitting: Family Medicine

## 2021-06-05 ENCOUNTER — Ambulatory Visit: Payer: Medicare HMO | Admitting: Urology

## 2021-06-09 DIAGNOSIS — Z7952 Long term (current) use of systemic steroids: Secondary | ICD-10-CM

## 2021-06-09 DIAGNOSIS — G7249 Other inflammatory and immune myopathies, not elsewhere classified: Secondary | ICD-10-CM

## 2021-06-09 DIAGNOSIS — I872 Venous insufficiency (chronic) (peripheral): Secondary | ICD-10-CM

## 2021-06-09 DIAGNOSIS — G629 Polyneuropathy, unspecified: Secondary | ICD-10-CM

## 2021-06-09 DIAGNOSIS — K5790 Diverticulosis of intestine, part unspecified, without perforation or abscess without bleeding: Secondary | ICD-10-CM | POA: Diagnosis not present

## 2021-06-09 DIAGNOSIS — M1990 Unspecified osteoarthritis, unspecified site: Secondary | ICD-10-CM

## 2021-06-09 DIAGNOSIS — Z7901 Long term (current) use of anticoagulants: Secondary | ICD-10-CM

## 2021-06-09 DIAGNOSIS — L97921 Non-pressure chronic ulcer of unspecified part of left lower leg limited to breakdown of skin: Secondary | ICD-10-CM

## 2021-06-09 DIAGNOSIS — I10 Essential (primary) hypertension: Secondary | ICD-10-CM

## 2021-06-09 DIAGNOSIS — Z9181 History of falling: Secondary | ICD-10-CM

## 2021-06-20 DIAGNOSIS — I872 Venous insufficiency (chronic) (peripheral): Secondary | ICD-10-CM | POA: Diagnosis not present

## 2021-06-20 DIAGNOSIS — K5790 Diverticulosis of intestine, part unspecified, without perforation or abscess without bleeding: Secondary | ICD-10-CM | POA: Diagnosis not present

## 2021-06-20 DIAGNOSIS — Z7952 Long term (current) use of systemic steroids: Secondary | ICD-10-CM | POA: Diagnosis not present

## 2021-06-20 DIAGNOSIS — Z9181 History of falling: Secondary | ICD-10-CM

## 2021-06-20 DIAGNOSIS — L97921 Non-pressure chronic ulcer of unspecified part of left lower leg limited to breakdown of skin: Secondary | ICD-10-CM | POA: Diagnosis not present

## 2021-06-20 DIAGNOSIS — M1990 Unspecified osteoarthritis, unspecified site: Secondary | ICD-10-CM | POA: Diagnosis not present

## 2021-06-20 DIAGNOSIS — I10 Essential (primary) hypertension: Secondary | ICD-10-CM | POA: Diagnosis not present

## 2021-06-20 DIAGNOSIS — G629 Polyneuropathy, unspecified: Secondary | ICD-10-CM | POA: Diagnosis not present

## 2021-06-20 DIAGNOSIS — Z7901 Long term (current) use of anticoagulants: Secondary | ICD-10-CM | POA: Diagnosis not present

## 2021-06-20 DIAGNOSIS — G7249 Other inflammatory and immune myopathies, not elsewhere classified: Secondary | ICD-10-CM | POA: Diagnosis not present

## 2021-07-17 ENCOUNTER — Other Ambulatory Visit: Payer: Self-pay | Admitting: Family Medicine

## 2021-07-31 ENCOUNTER — Ambulatory Visit: Payer: Medicare HMO | Admitting: Urology

## 2021-07-31 ENCOUNTER — Encounter: Payer: Self-pay | Admitting: Urology

## 2021-07-31 VITALS — BP 122/66 | HR 91

## 2021-07-31 DIAGNOSIS — N3941 Urge incontinence: Secondary | ICD-10-CM | POA: Diagnosis not present

## 2021-07-31 DIAGNOSIS — N3020 Other chronic cystitis without hematuria: Secondary | ICD-10-CM

## 2021-07-31 MED ORDER — TROSPIUM CHLORIDE 20 MG PO TABS: 20.0000 mg | ORAL_TABLET | Freq: Two times a day (BID) | ORAL | 3 refills | Status: DC

## 2021-07-31 NOTE — Progress Notes (Signed)
?Subjective: ? ?1. Urge incontinence of urine   ?2. Chronic cystitis   ?  ? ?[name] [name] returns today for history of UUI.  She didn't benefit from [place] and has subsequently been receiving PTNS treatments with the last in 11/22. The last treatment was not done because of severe ankle edema and has venous stasis disease and is getting wraps.  She still  uses 6 ppd and a heavy pad at night.   She has had no dysuria or hematuria but has a history of UTI's. Cystoscopy was unremarkable last year.   She couldn't get a specimen today.   She had a DVT in the left leg and remains on Eliquis.  ? ?ROS: ? ?ROS:  ?A complete review of systems was performed.  All systems are negative except for pertinent findings as noted.  ? ?Review of Systems  ?Cardiovascular:  Positive for leg swelling.  ?Musculoskeletal:  Positive for back pain and joint pain.  ?Neurological:  Positive for weakness.  ?     She needs a right knee replacement and has a left DVT and uses a wheelchair.   ?All other systems reviewed and are negative. ? ?Allergies  ?Allergen Reactions  ? Cellcept [Mycophenolate Mofetil]   ?  Muscle cramps, mouth soreness, difficulty swallowing, bruising  ? Statins   ?  Dx of in  ? Ciprofloxacin Hcl Other (See Comments)  ?  Chills, sick, could not tolerate it  ? Hydrocodone Nausea Only  ? Penicillins Rash  ? ? ?Outpatient Encounter Medications as of 07/31/2021  ?Medication Sig  ? acetaminophen (TYLENOL) 650 MG CR tablet Take 650 mg by mouth every 6 (six) hours.  ? apixaban (ELIQUIS) 5 MG TABS tablet Take 1 tablet (5 mg total) by mouth 2 (two) times daily.  ? cholecalciferol (VITAMIN D3) 25 MCG (1000 UNIT) tablet Take 1 tablet (1,000 Units total) by mouth daily.  ? ezetimibe (ZETIA) 10 MG tablet TAKE 1 TABLET EVERY DAY  ? folic acid (FOLVITE) 1 MG tablet TAKE 1 TABLET EVERY DAY  ? gabapentin (NEURONTIN) 100 MG capsule TAKE 1 CAPSULE EVERY MORNING AND TAKE 2 CAPSULES AT BEDTIME  ? hydrocortisone cream 0.5 % Apply spaingly twice daily to  rash on right cheek for 5 days, then as needed  ? magnesium oxide (MAG-OX) 400 (240 Mg) MG tablet TAKE 1 TABLET TWICE DAILY  ? methotrexate 2.5 MG tablet Take 6 tablets (15 mg total) by mouth once a week. Caution:Chemotherapy. Protect from light.  ? NON FORMULARY Fluid restriction of 1.8 liters per day (1742m total) ?7-3 = 800 ml ?3-11 = 800 ml ?11-7 = 175 ml ?Document totals qshift. ?Every Shift ?Day, Evening, Nigh  ? NON FORMULARY Diet: _____ Regular, ?__x____ NAS, ?_______Consistent Carbohydrate, ?_______NPO ?_____Other  ? predniSONE (DELTASONE) 5 MG tablet Take 2 tablets (10 mg total) by mouth daily with breakfast.  ? triamterene-hydrochlorothiazide (MAXZIDE-25) 37.5-25 MG tablet TAKE 1 TABLET EVERY DAY (DOSE REDUCTION)  ? trospium (SANCTURA) 20 MG tablet Take 1 tablet (20 mg total) by mouth 2 (two) times daily.  ? [DISCONTINUED] acyclovir (ZOVIRAX) 800 MG tablet Take 1 tablet (800 mg total) by mouth 3 (three) times daily. (Patient not taking: Reported on 07/31/2021)  ? ?No facility-administered encounter medications on file as of 07/31/2021.  ? ? ?Past Medical History:  ?Diagnosis Date  ? Acute deep vein thrombosis (DVT) of left femoral vein ([place] 02/07/2019  ? Dx 02/05/2019  ? Arthritis   ? spinal stenosis  ? Deep vein thrombosis (DVT) of left  lower extremity ([place]) 08/04/2019  ? Elevated LFTs   ? secondary to fatty liver, negative work-up in 2011  ? Hypercholesterolemia   ? Hypertension   ? Lumbar spinal stenosis 01/14/2018  ? L3-4 and L4-5  ? Myopathy 02/15/2018  ? Osteoarthritis   ? right knee  ? Peripheral neuropathy 07/06/2018  ? PONV (postoperative nausea and vomiting)   ? Uterine cancer ([place]) 08/2006  ? grade 1, no recurrence up to 2013  ? ? ?Past Surgical History:  ?Procedure Laterality Date  ? ABDOMINAL HYSTERECTOMY  2008  ? adenocarcinoma stage 1  ? APPENDECTOMY  1973  ? BREAST BIOPSY Left 2018  ? Benign  ? CHOLECYSTECTOMY  1973  ? COLONOSCOPY    06/21/2007  ? PRF:FMBWGY rectum/Sigmoid diverticula,  diminutive hepatic flexure polyp s/p bx. Benign.   ? COLONOSCOPY N/A 10/17/2012  ? Dr. Oneida Alar- moderate diverticulosis was noted in the sigmoid colon, moderate sized internal hemorrhoids. next tcs in10 years  ? ESOPHAGOGASTRODUODENOSCOPY N/A 03/20/2016  ? Dr. Oneida Alar, widely patent Schatzki ring, anemia due to ASA induced erosive gastritis, mild duodenitis. gastric bx benign without H.pylori.   ? FOOT SURGERY  2007  ? Pins in toes on left foot, 5 to 6 yrs ago  ? GIVENS CAPSULE STUDY N/A 03/20/2016  ? Procedure: GIVENS CAPSULE STUDY;  Surgeon: Danie Binder, MD;  [facility];  Service: Endoscopy;  Laterality: N/A;  ? MUSCLE BIOPSY Left 06/07/2018  ? Procedure: LEFT THIGH MUSCLE BIOPSY;  Surgeon: Erroll Luna, MD;  Location: [facility];  Service: General;  Laterality: Left;  ? TONSILLECTOMY    ? ? ?Social History  ? ?Socioeconomic History  ? Marital status: Widowed  ?  Spouse name: Jeneen Rinks   ? Number of children: 3  ? Years of education: 72  ? Highest education level: 12th grade  ?Occupational History  ? Occupation: retired   ?Tobacco Use  ? Smoking status: Never  ? Smokeless tobacco: Never  ? Tobacco comments:  ?  Never smoked  ?Vaping Use  ? Vaping Use: Never used  ?Substance and Sexual Activity  ? Alcohol use: No  ? Drug use: No  ? Sexual activity: Not Currently  ?  Birth control/protection: Post-menopausal, Surgical  ?Other Topics Concern  ? Not on file  ?Social History Narrative  ? Husband Jeneen Rinks recently passed away  ? Caffeine use: soda daily  ? Right handed   ? ?Social Determinants of Health  ? ?Financial Resource Strain: Low Risk   ? Difficulty of Paying Living Expenses: Not hard at all  ?Food Insecurity: No Food Insecurity  ? Worried About Charity fundraiser in the Last Year: Never true  ? Ran Out of Food in the Last Year: Never true  ?Transportation Needs: No Transportation Needs  ? Lack of Transportation (Medical): No  ? Lack of Transportation (Non-Medical): No  ?Physical  Activity: Insufficiently Active  ? Days of Exercise per Week: 4 days  ? Minutes of Exercise per Session: 30 min  ?Stress: No Stress Concern Present  ? Feeling of Stress : Not at all  ?Social Connections: Socially Isolated  ? Frequency of Communication with Friends and Family: Three times a week  ? Frequency of Social Gatherings with Friends and Family: Three times a week  ? Attends Religious Services: Never  ? Active Member of Clubs or Organizations: No  ? Attends Archivist Meetings: Never  ? Marital Status: Widowed  ?Intimate Partner Violence: Not At Risk  ? Fear  of Current or Ex-Partner: No  ? Emotionally Abused: No  ? Physically Abused: No  ? Sexually Abused: No  ? ? ?Family History  ?Problem Relation Age of Onset  ? Heart disease Father   ? Bladder Cancer Brother   ?     in remission   ? Hypertension Sister   ? Dementia Sister   ? Diabetes type II Sister   ? Hypertension Sister   ? Aneurysm Sister   ? Hypertension Brother   ? Arthritis Brother   ? Hypertension Son   ? Colon cancer Neg Hx   ? ? ? ? ? ?Objective: ?Vitals:  ? 07/31/21 1020  ?BP: 122/66  ?Pulse: 91  ? ? ? ? ?Physical Exam ? ?Lab Results:  ?No results found for this or any previous visit (from the past 24 hour(s)). ? ?Recent Results (from the past 2160 hour(s))  ?CK     Status: None  ? Collection Time: 05/05/21 11:12 AM  ?Result Value Ref Range  ? Total CK 145 26 - 161 U/L  ?Hgb A1c w/o eAG     Status: Abnormal  ? Collection Time: 05/05/21 11:12 AM  ?Result Value Ref Range  ? Hgb A1c MFr Bld 5.9 (H) 4.8 - 5.6 %  ?  Comment:          Prediabetes: 5.7 - 6.4 ?         Diabetes: >6.4 ?         Glycemic control for adults with diabetes: <7.0 ?  ?TSH     Status: None  ? Collection Time: 05/05/21 11:12 AM  ?Result Value Ref Range  ? TSH 2.820 0.450 - 4.500 uIU/mL  ? ? ?Lab work from January showed a normal Cr of 0.98 with CKD3.  ? ? ?Studies/Results: ? ? ?Assessment & Plan: ?Chronic cystitis with a history of  bladder wall inflammation.   She was  unable to get a specimen today but has no concerning symptoms.  ? ?Urge incontinence. This hasn't improved with the antibiotics or Gemtesa and she failed PTNS.  I don't think she would be a good candidate for [employer]

## 2021-08-12 ENCOUNTER — Telehealth: Payer: Self-pay

## 2021-08-12 NOTE — Telephone Encounter (Signed)
Pharmacy call stating Trospium is not covered on pts insurance. Alternative medication that are covered are Oxybutynin and Solifenacin. Please advise.

## 2021-08-13 ENCOUNTER — Other Ambulatory Visit: Payer: Self-pay

## 2021-08-13 DIAGNOSIS — M1990 Unspecified osteoarthritis, unspecified site: Secondary | ICD-10-CM | POA: Diagnosis not present

## 2021-08-13 DIAGNOSIS — K5790 Diverticulosis of intestine, part unspecified, without perforation or abscess without bleeding: Secondary | ICD-10-CM | POA: Diagnosis not present

## 2021-08-13 DIAGNOSIS — I872 Venous insufficiency (chronic) (peripheral): Secondary | ICD-10-CM | POA: Diagnosis not present

## 2021-08-13 DIAGNOSIS — Z7901 Long term (current) use of anticoagulants: Secondary | ICD-10-CM | POA: Diagnosis not present

## 2021-08-13 DIAGNOSIS — L97921 Non-pressure chronic ulcer of unspecified part of left lower leg limited to breakdown of skin: Secondary | ICD-10-CM | POA: Diagnosis not present

## 2021-08-13 DIAGNOSIS — G629 Polyneuropathy, unspecified: Secondary | ICD-10-CM | POA: Diagnosis not present

## 2021-08-13 DIAGNOSIS — Z9181 History of falling: Secondary | ICD-10-CM | POA: Diagnosis not present

## 2021-08-13 DIAGNOSIS — I10 Essential (primary) hypertension: Secondary | ICD-10-CM | POA: Diagnosis not present

## 2021-08-13 DIAGNOSIS — G7249 Other inflammatory and immune myopathies, not elsewhere classified: Secondary | ICD-10-CM | POA: Diagnosis not present

## 2021-08-13 MED ORDER — SOLIFENACIN SUCCINATE 10 MG PO TABS: 10.0000 mg | ORAL_TABLET | Freq: Every day | ORAL | 3 refills | Status: DC

## 2021-08-13 NOTE — Telephone Encounter (Signed)
Patient called back advising she would like medication sent to [facility] listed on chart.    Thank you

## 2021-08-13 NOTE — Telephone Encounter (Signed)
Rx sent 

## 2021-08-15 DIAGNOSIS — I872 Venous insufficiency (chronic) (peripheral): Secondary | ICD-10-CM | POA: Diagnosis not present

## 2021-08-15 DIAGNOSIS — Z7901 Long term (current) use of anticoagulants: Secondary | ICD-10-CM | POA: Diagnosis not present

## 2021-08-15 DIAGNOSIS — G7249 Other inflammatory and immune myopathies, not elsewhere classified: Secondary | ICD-10-CM | POA: Diagnosis not present

## 2021-08-15 DIAGNOSIS — G629 Polyneuropathy, unspecified: Secondary | ICD-10-CM | POA: Diagnosis not present

## 2021-08-15 DIAGNOSIS — I10 Essential (primary) hypertension: Secondary | ICD-10-CM | POA: Diagnosis not present

## 2021-08-15 DIAGNOSIS — Z9181 History of falling: Secondary | ICD-10-CM | POA: Diagnosis not present

## 2021-08-15 DIAGNOSIS — M1990 Unspecified osteoarthritis, unspecified site: Secondary | ICD-10-CM | POA: Diagnosis not present

## 2021-08-15 DIAGNOSIS — K5790 Diverticulosis of intestine, part unspecified, without perforation or abscess without bleeding: Secondary | ICD-10-CM | POA: Diagnosis not present

## 2021-08-15 DIAGNOSIS — L97921 Non-pressure chronic ulcer of unspecified part of left lower leg limited to breakdown of skin: Secondary | ICD-10-CM | POA: Diagnosis not present

## 2021-08-18 ENCOUNTER — Other Ambulatory Visit (HOSPITAL_COMMUNITY): Payer: Self-pay | Admitting: Hematology

## 2021-08-19 DIAGNOSIS — M1990 Unspecified osteoarthritis, unspecified site: Secondary | ICD-10-CM

## 2021-08-19 DIAGNOSIS — I10 Essential (primary) hypertension: Secondary | ICD-10-CM | POA: Diagnosis not present

## 2021-08-19 DIAGNOSIS — G629 Polyneuropathy, unspecified: Secondary | ICD-10-CM

## 2021-08-19 DIAGNOSIS — Z9181 History of falling: Secondary | ICD-10-CM

## 2021-08-19 DIAGNOSIS — G7249 Other inflammatory and immune myopathies, not elsewhere classified: Secondary | ICD-10-CM | POA: Diagnosis not present

## 2021-08-19 DIAGNOSIS — Z7952 Long term (current) use of systemic steroids: Secondary | ICD-10-CM

## 2021-08-19 DIAGNOSIS — L97921 Non-pressure chronic ulcer of unspecified part of left lower leg limited to breakdown of skin: Secondary | ICD-10-CM | POA: Diagnosis not present

## 2021-08-19 DIAGNOSIS — I872 Venous insufficiency (chronic) (peripheral): Secondary | ICD-10-CM | POA: Diagnosis not present

## 2021-08-19 DIAGNOSIS — Z7901 Long term (current) use of anticoagulants: Secondary | ICD-10-CM

## 2021-08-29 ENCOUNTER — Ambulatory Visit (INDEPENDENT_AMBULATORY_CARE_PROVIDER_SITE_OTHER): Payer: Medicare HMO | Admitting: Family Medicine

## 2021-08-29 ENCOUNTER — Encounter: Payer: Self-pay | Admitting: Family Medicine

## 2021-08-29 VITALS — BP 131/77 | HR 89 | Ht 66.0 in | Wt 172.0 lb

## 2021-08-29 DIAGNOSIS — I10 Essential (primary) hypertension: Secondary | ICD-10-CM

## 2021-08-29 DIAGNOSIS — I872 Venous insufficiency (chronic) (peripheral): Secondary | ICD-10-CM | POA: Diagnosis not present

## 2021-08-29 DIAGNOSIS — L97825 Non-pressure chronic ulcer of other part of left lower leg with muscle involvement without evidence of necrosis: Secondary | ICD-10-CM

## 2021-08-29 DIAGNOSIS — M48061 Spinal stenosis, lumbar region without neurogenic claudication: Secondary | ICD-10-CM | POA: Diagnosis not present

## 2021-08-29 NOTE — Progress Notes (Signed)
   [name] [name]     MRN: [id number]      DOB: [birth year]   HPI [name] [name] is here for follow up and re-evaluation of chronic medical conditions, medication management and review of any available recent lab and radiology data.  [name] out of bed, slid off of pad when getting up on 5/29, had to g call in family for help to get up No cuts but has residual right neck pain [name] when going out the door several months  ago, states rigth knee is unpredictable, no longer uses walker but gets around in a manual wheelchair Legs improved, but services stopped 2 weeks ago, she is concerned that right leg is beginning to look red again, an dscant drainage from left  leg which is new Requests assistance with baths, unable to safely bathe herself and current caregiver not assissting her with this  ROS Denies recent fever or chills. Denies sinus pressure, nasal congestion, ear pain or sore throat. Denies chest congestion, productive cough or wheezing. Denies chest pains, palpitations and leg swelling Denies abdominal pain, nausea, vomiting,diarrhea or constipation.   Denies dysuria, frequency, hesitancy or incontinence. . Denies depression, anxiety or insomnia.  PE  BP 131/77   Pulse 89   Ht 5' 6"  (1.676 m)   Wt 172 lb (78 kg)   SpO2 94%   BMI 27.76 kg/m   Patient alert and oriented and in no cardiopulmonary distress.  HEENT: No facial asymmetry, EOMI,     Neck decreased rOM.  Chest: Clear to auscultation bilaterally.  CVS: S1, S2 no murmurs, no S3.Regular rate.  ABD: Soft non tender.   Ext: trace  edema  MS: Decreased  ROM spine, shoulders, hips and knees.  Skin: swelling and erythema of both lE, right worse than ;left, weeeping of left lower extremity, area of erythema and skin breakdown on left leg, skin breakdown and erythma of right leg  Psych: Good eye contact, normal affect. Memory intact not anxious or depressed appearing.  CNS: CN 2-12 intact, power,  normal throughout.no focal  deficits noted.   Assessment & Plan  Essential hypertension Controlled, no change in medication   Venous stasis ulcers (HCC) Recurrent t erythema and ulceration of both lower extremities with weeping and redness , needs home health eval and managment  Lumbar spinal stenosis Gait instability, weakness and repqaeated falls, needs in home assistance with baths 2 o 3 times weekly, home safety also reviewed

## 2021-08-29 NOTE — Patient Instructions (Addendum)
Annual exam early September, call if you need me sooner  We will reach out to home health for in home assistance care as far as getting regular boths, even 3 times/ week, a caregiver who can transport you probably, and [place] for nursing to check legs once weekly for next 4 weeks as left leg is again starting to get slightly red and possible breakdown on both legs again  Careful, no more falls!  Thanks for choosing [facility], we consider it a privelige to serve you.

## 2021-09-02 ENCOUNTER — Telehealth: Payer: Self-pay | Admitting: Neurology

## 2021-09-02 NOTE — Telephone Encounter (Signed)
LVM for r/s needed for 8/17 appt - sarah out

## 2021-09-04 ENCOUNTER — Telehealth: Payer: Self-pay

## 2021-09-04 ENCOUNTER — Other Ambulatory Visit: Payer: Self-pay | Admitting: Family Medicine

## 2021-09-04 DIAGNOSIS — I872 Venous insufficiency (chronic) (peripheral): Secondary | ICD-10-CM

## 2021-09-04 NOTE — Telephone Encounter (Signed)
Referred urgently back to home health per last ov note

## 2021-09-04 NOTE — Telephone Encounter (Signed)
Patient called has not heard a word from home assistance, return patient call at [phone].

## 2021-09-05 ENCOUNTER — Telehealth: Payer: Self-pay | Admitting: Family Medicine

## 2021-09-05 ENCOUNTER — Encounter: Payer: Self-pay | Admitting: Family Medicine

## 2021-09-05 NOTE — Assessment & Plan Note (Signed)
Recurrent t erythema and ulceration of both lower extremities with weeping and redness , needs home health eval and managment

## 2021-09-05 NOTE — Telephone Encounter (Signed)
[facility] just closed her out and they will have to get authorization to see her again and they need her last note addended to say that the wounds have reopened and are currently draining before they can get auth to go back out to see her. Let me know when done and I will let [facility] know

## 2021-09-05 NOTE — Assessment & Plan Note (Signed)
Controlled, no change in medication  

## 2021-09-05 NOTE — Assessment & Plan Note (Signed)
Gait instability, weakness and repqaeated falls, needs in home assistance with baths 2 o 3 times weekly, home safety also reviewed

## 2021-09-05 NOTE — Telephone Encounter (Signed)
Pt called stating the lesion on her leg is draining, wanted to let you know and wants to know if the home nurses needed to come more frequent that once a week?

## 2021-09-10 ENCOUNTER — Other Ambulatory Visit (HOSPITAL_COMMUNITY): Payer: Self-pay | Admitting: Hematology

## 2021-09-10 ENCOUNTER — Other Ambulatory Visit (HOSPITAL_COMMUNITY): Payer: Self-pay | Admitting: *Deleted

## 2021-09-10 NOTE — Telephone Encounter (Signed)
Note was closed and [facility] was notified

## 2021-09-15 NOTE — Telephone Encounter (Signed)
Patient calling back still not heard anything about home health. Patient asked if nurse would please give her a call [phone].

## 2021-09-16 ENCOUNTER — Telehealth: Payer: Self-pay

## 2021-09-16 NOTE — Telephone Encounter (Signed)
See previous message

## 2021-09-16 NOTE — Telephone Encounter (Signed)
Patient said was returning a nurse call but no message was left why calling. Patient asked to return her call.

## 2021-09-20 ENCOUNTER — Other Ambulatory Visit: Payer: Self-pay

## 2021-09-20 ENCOUNTER — Encounter (HOSPITAL_COMMUNITY): Payer: Self-pay

## 2021-09-20 ENCOUNTER — Emergency Department (HOSPITAL_COMMUNITY): Payer: Medicare HMO

## 2021-09-20 ENCOUNTER — Inpatient Hospital Stay (HOSPITAL_COMMUNITY)
Admission: EM | Admit: 2021-09-20 | Discharge: 2021-09-24 | DRG: 565 | Disposition: A | Discharge status: Skilled Nursing Facility | Payer: Medicare HMO | Attending: Internal Medicine | Admitting: Internal Medicine

## 2021-09-20 DIAGNOSIS — R9431 Abnormal electrocardiogram [ECG] [EKG]: Secondary | ICD-10-CM | POA: Diagnosis not present

## 2021-09-20 DIAGNOSIS — Z8052 Family history of malignant neoplasm of bladder: Secondary | ICD-10-CM | POA: Diagnosis not present

## 2021-09-20 DIAGNOSIS — E559 Vitamin D deficiency, unspecified: Secondary | ICD-10-CM | POA: Diagnosis not present

## 2021-09-20 DIAGNOSIS — M1711 Unilateral primary osteoarthritis, right knee: Secondary | ICD-10-CM | POA: Diagnosis present

## 2021-09-20 DIAGNOSIS — I110 Hypertensive heart disease with heart failure: Secondary | ICD-10-CM | POA: Diagnosis present

## 2021-09-20 DIAGNOSIS — I825Z2 Chronic embolism and thrombosis of unspecified deep veins of left distal lower extremity: Secondary | ICD-10-CM | POA: Diagnosis not present

## 2021-09-20 DIAGNOSIS — I10 Essential (primary) hypertension: Secondary | ICD-10-CM | POA: Diagnosis not present

## 2021-09-20 DIAGNOSIS — R7989 Other specified abnormal findings of blood chemistry: Secondary | ICD-10-CM

## 2021-09-20 DIAGNOSIS — I5032 Chronic diastolic (congestive) heart failure: Secondary | ICD-10-CM | POA: Diagnosis present

## 2021-09-20 DIAGNOSIS — L97909 Non-pressure chronic ulcer of unspecified part of unspecified lower leg with unspecified severity: Secondary | ICD-10-CM | POA: Diagnosis present

## 2021-09-20 DIAGNOSIS — E785 Hyperlipidemia, unspecified: Secondary | ICD-10-CM | POA: Diagnosis present

## 2021-09-20 DIAGNOSIS — M6281 Muscle weakness (generalized): Secondary | ICD-10-CM | POA: Diagnosis not present

## 2021-09-20 DIAGNOSIS — R739 Hyperglycemia, unspecified: Secondary | ICD-10-CM | POA: Diagnosis present

## 2021-09-20 DIAGNOSIS — I83009 Varicose veins of unspecified lower extremity with ulcer of unspecified site: Secondary | ICD-10-CM | POA: Diagnosis not present

## 2021-09-20 DIAGNOSIS — I878 Other specified disorders of veins: Secondary | ICD-10-CM | POA: Diagnosis present

## 2021-09-20 DIAGNOSIS — L97825 Non-pressure chronic ulcer of other part of left lower leg with muscle involvement without evidence of necrosis: Secondary | ICD-10-CM | POA: Diagnosis not present

## 2021-09-20 DIAGNOSIS — E782 Mixed hyperlipidemia: Secondary | ICD-10-CM | POA: Diagnosis present

## 2021-09-20 DIAGNOSIS — E876 Hypokalemia: Secondary | ICD-10-CM | POA: Diagnosis present

## 2021-09-20 DIAGNOSIS — G629 Polyneuropathy, unspecified: Secondary | ICD-10-CM | POA: Diagnosis present

## 2021-09-20 DIAGNOSIS — I248 Other forms of acute ischemic heart disease: Secondary | ICD-10-CM | POA: Diagnosis present

## 2021-09-20 DIAGNOSIS — R778 Other specified abnormalities of plasma proteins: Secondary | ICD-10-CM

## 2021-09-20 DIAGNOSIS — T796XXA Traumatic ischemia of muscle, initial encounter: Secondary | ICD-10-CM | POA: Diagnosis not present

## 2021-09-20 DIAGNOSIS — R296 Repeated falls: Secondary | ICD-10-CM | POA: Diagnosis not present

## 2021-09-20 DIAGNOSIS — Z8249 Family history of ischemic heart disease and other diseases of the circulatory system: Secondary | ICD-10-CM | POA: Diagnosis not present

## 2021-09-20 DIAGNOSIS — Z7952 Long term (current) use of systemic steroids: Secondary | ICD-10-CM

## 2021-09-20 DIAGNOSIS — Z888 Allergy status to other drugs, medicaments and biological substances status: Secondary | ICD-10-CM

## 2021-09-20 DIAGNOSIS — M25552 Pain in left hip: Secondary | ICD-10-CM | POA: Diagnosis not present

## 2021-09-20 DIAGNOSIS — W1830XA Fall on same level, unspecified, initial encounter: Secondary | ICD-10-CM | POA: Diagnosis present

## 2021-09-20 DIAGNOSIS — D72829 Elevated white blood cell count, unspecified: Secondary | ICD-10-CM | POA: Diagnosis not present

## 2021-09-20 DIAGNOSIS — M1990 Unspecified osteoarthritis, unspecified site: Secondary | ICD-10-CM

## 2021-09-20 DIAGNOSIS — S0990XA Unspecified injury of head, initial encounter: Secondary | ICD-10-CM | POA: Diagnosis not present

## 2021-09-20 DIAGNOSIS — Z20822 Contact with and (suspected) exposure to covid-19: Secondary | ICD-10-CM | POA: Diagnosis present

## 2021-09-20 DIAGNOSIS — I872 Venous insufficiency (chronic) (peripheral): Secondary | ICD-10-CM | POA: Diagnosis not present

## 2021-09-20 DIAGNOSIS — W19XXXA Unspecified fall, initial encounter: Principal | ICD-10-CM

## 2021-09-20 DIAGNOSIS — Z8261 Family history of arthritis: Secondary | ICD-10-CM | POA: Diagnosis not present

## 2021-09-20 DIAGNOSIS — Z885 Allergy status to narcotic agent status: Secondary | ICD-10-CM

## 2021-09-20 DIAGNOSIS — Z880 Allergy status to penicillin: Secondary | ICD-10-CM

## 2021-09-20 DIAGNOSIS — D649 Anemia, unspecified: Secondary | ICD-10-CM | POA: Diagnosis not present

## 2021-09-20 DIAGNOSIS — Z7901 Long term (current) use of anticoagulants: Secondary | ICD-10-CM | POA: Diagnosis not present

## 2021-09-20 DIAGNOSIS — Z8542 Personal history of malignant neoplasm of other parts of uterus: Secondary | ICD-10-CM | POA: Diagnosis not present

## 2021-09-20 DIAGNOSIS — Y92009 Unspecified place in unspecified non-institutional (private) residence as the place of occurrence of the external cause: Secondary | ICD-10-CM

## 2021-09-20 DIAGNOSIS — Z79899 Other long term (current) drug therapy: Secondary | ICD-10-CM

## 2021-09-20 DIAGNOSIS — R079 Chest pain, unspecified: Secondary | ICD-10-CM | POA: Diagnosis not present

## 2021-09-20 DIAGNOSIS — Z833 Family history of diabetes mellitus: Secondary | ICD-10-CM

## 2021-09-20 DIAGNOSIS — R0902 Hypoxemia: Secondary | ICD-10-CM | POA: Diagnosis not present

## 2021-09-20 DIAGNOSIS — R531 Weakness: Secondary | ICD-10-CM

## 2021-09-20 DIAGNOSIS — M6282 Rhabdomyolysis: Secondary | ICD-10-CM | POA: Diagnosis present

## 2021-09-20 DIAGNOSIS — Z79631 Long term (current) use of antimetabolite agent: Secondary | ICD-10-CM

## 2021-09-20 LAB — URINALYSIS, ROUTINE W REFLEX MICROSCOPIC
Bilirubin Urine: NEGATIVE
Glucose, UA: NEGATIVE mg/dL
Ketones, ur: NEGATIVE mg/dL
Nitrite: NEGATIVE
Protein, ur: 30 mg/dL — AB
Specific Gravity, Urine: 1.013 (ref 1.005–1.030)
pH: 7 (ref 5.0–8.0)

## 2021-09-20 LAB — CBC WITH DIFFERENTIAL/PLATELET
Abs Immature Granulocytes: 0.45 10*3/uL — ABNORMAL HIGH (ref 0.00–0.07)
Basophils Absolute: 0 10*3/uL (ref 0.0–0.1)
Basophils Relative: 0 %
Eosinophils Absolute: 0.4 10*3/uL (ref 0.0–0.5)
Eosinophils Relative: 2 %
HCT: 41.6 % (ref 36.0–46.0)
Hemoglobin: 13.5 g/dL (ref 12.0–15.0)
Immature Granulocytes: 2 %
Lymphocytes Relative: 2 %
Lymphs Abs: 0.3 10*3/uL — ABNORMAL LOW (ref 0.7–4.0)
MCH: 31.4 pg (ref 26.0–34.0)
MCHC: 32.5 g/dL (ref 30.0–36.0)
MCV: 96.7 fL (ref 80.0–100.0)
Monocytes Absolute: 0.2 10*3/uL (ref 0.1–1.0)
Monocytes Relative: 1 %
Neutro Abs: 17.6 10*3/uL — ABNORMAL HIGH (ref 1.7–7.7)
Neutrophils Relative %: 93 %
Platelets: 204 10*3/uL (ref 150–400)
RBC: 4.3 MIL/uL (ref 3.87–5.11)
RDW: 14.6 % (ref 11.5–15.5)
WBC: 19 10*3/uL — ABNORMAL HIGH (ref 4.0–10.5)
nRBC: 0 % (ref 0.0–0.2)

## 2021-09-20 LAB — BASIC METABOLIC PANEL
Anion gap: 11 (ref 5–15)
BUN: 36 mg/dL — ABNORMAL HIGH (ref 8–23)
CO2: 28 mmol/L (ref 22–32)
Calcium: 9 mg/dL (ref 8.9–10.3)
Chloride: 101 mmol/L (ref 98–111)
Creatinine, Ser: 1.16 mg/dL — ABNORMAL HIGH (ref 0.44–1.00)
GFR, Estimated: 46 mL/min — ABNORMAL LOW (ref >60)
Glucose, Bld: 140 mg/dL — ABNORMAL HIGH (ref 70–99)
Potassium: 3.4 mmol/L — ABNORMAL LOW (ref 3.5–5.1)
Sodium: 140 mmol/L (ref 135–145)

## 2021-09-20 LAB — CK: Total CK: 8060 U/L — ABNORMAL HIGH (ref 38–234)

## 2021-09-20 LAB — SARS CORONAVIRUS 2 BY RT PCR: SARS Coronavirus 2 by RT PCR: NEGATIVE

## 2021-09-20 LAB — TROPONIN I (HIGH SENSITIVITY)
Troponin I (High Sensitivity): 241 ng/L (ref <18)
Troponin I (High Sensitivity): 208 ng/L (ref <18)

## 2021-09-20 MED ORDER — SODIUM BICARBONATE 8.4 % IV SOLN
Freq: Once | INTRAVENOUS | Status: AC
  Filled 2021-09-20: qty 1000

## 2021-09-20 MED ORDER — SODIUM CHLORIDE 0.9 % IV BOLUS
500.0000 mL | Freq: Once | INTRAVENOUS | Status: AC
  Administered 2021-09-20: 500 mL via INTRAVENOUS

## 2021-09-20 MED ORDER — POTASSIUM CHLORIDE CRYS ER 20 MEQ PO TBCR
40.0000 meq | EXTENDED_RELEASE_TABLET | Freq: Once | ORAL | Status: AC
  Administered 2021-09-20: 40 meq via ORAL
  Filled 2021-09-20: qty 2

## 2021-09-20 MED ORDER — PREDNISONE 10 MG PO TABS
10.0000 mg | ORAL_TABLET | Freq: Every day | ORAL | Status: DC
  Administered 2021-09-21 – 2021-09-24 (×4): 10 mg via ORAL
  Filled 2021-09-20 (×4): qty 1

## 2021-09-20 MED ORDER — SODIUM BICARBONATE 8.4 % IV SOLN
INTRAVENOUS | Status: AC
  Filled 2021-09-20: qty 150

## 2021-09-20 MED ORDER — ACETAMINOPHEN 325 MG PO TABS
650.0000 mg | ORAL_TABLET | Freq: Four times a day (QID) | ORAL | Status: DC | PRN
  Administered 2021-09-21 – 2021-09-22 (×2): 650 mg via ORAL
  Filled 2021-09-20 (×2): qty 2

## 2021-09-20 MED ORDER — SODIUM CHLORIDE 0.9 % IV SOLN
INTRAVENOUS | Status: DC
Start: 2021-09-20 — End: 2021-09-21

## 2021-09-20 MED ORDER — APIXABAN 5 MG PO TABS
5.0000 mg | ORAL_TABLET | Freq: Two times a day (BID) | ORAL | Status: DC
  Administered 2021-09-20 – 2021-09-24 (×8): 5 mg via ORAL
  Filled 2021-09-20 (×8): qty 1

## 2021-09-20 MED ORDER — VITAMIN D 25 MCG (1000 UNIT) PO TABS
1000.0000 [IU] | ORAL_TABLET | Freq: Every day | ORAL | Status: DC
  Administered 2021-09-21 – 2021-09-24 (×4): 1000 [IU] via ORAL
  Filled 2021-09-20 (×4): qty 1

## 2021-09-20 MED ORDER — ACETAMINOPHEN 650 MG RE SUPP: 650.0000 mg | Freq: Four times a day (QID) | RECTAL | Status: DC | PRN

## 2021-09-20 MED ORDER — GLUCERNA SHAKE PO LIQD
237.0000 mL | Freq: Three times a day (TID) | ORAL | Status: DC
  Administered 2021-09-21 – 2021-09-24 (×9): 237 mL via ORAL

## 2021-09-20 MED ORDER — EZETIMIBE 10 MG PO TABS
10.0000 mg | ORAL_TABLET | Freq: Every day | ORAL | Status: DC
  Administered 2021-09-21 – 2021-09-24 (×4): 10 mg via ORAL
  Filled 2021-09-20 (×4): qty 1

## 2021-09-20 NOTE — ED Notes (Signed)
Changed and cleaned Pt. Pt Had urinated in the bed. Put Pt on a pure wick.

## 2021-09-20 NOTE — ED Provider Notes (Signed)
[facility] EMERGENCY DEPARTMENT Provider Note   CSN: [id number] Arrival date & time: 09/20/21  1701     History {Add pertinent medical, surgical, social history, OB history to HPI:1} Chief Complaint  Patient presents with   [name] [name] is a 88 y.o. female.  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  She said over the past few weeks she has had some falls.  She is also felt generally weak.  She was found by family today on the floor after a fall.  She had been on the floor all night.  She denies any head injury or loss of consciousness.  No headache chest pain shortness of breath abdominal pain or vomiting.  She said she has had some diarrhea.  Denies any urinary symptoms.  No numbness or focal weakness.  No neck or back pain.  She has some chronic leg swelling and wounds that she follows with primary care doctor and gets dressing changes.  She is on blood thinners  The history is provided by the patient.  Fall This is a recurrent problem. The current episode started yesterday. The problem has not changed since onset.Pertinent negatives include no chest pain, no abdominal pain, no headaches and no shortness of breath. Nothing aggravates the symptoms. Nothing relieves the symptoms. She has tried rest for the symptoms. The treatment provided no relief.       Home Medications Prior to Admission medications   Medication Sig Start Date End Date Taking? Authorizing Provider  acetaminophen (TYLENOL) 650 MG CR tablet Take 650 mg by mouth every 6 (six) hours. 08/08/19   [provider]  cholecalciferol (VITAMIN D3) 25 MCG (1000 UNIT) tablet Take 1 tablet (1,000 Units total) by mouth daily. 04/16/21   Harriett Rush, PA-C  ELIQUIS 5 MG TABS tablet TAKE 1 TABLET TWICE DAILY 09/10/21   Derek Jack, MD  ezetimibe (ZETIA) 10 MG tablet TAKE 1 TABLET EVERY DAY 06/02/21   Fayrene Helper, MD  folic  acid (FOLVITE) 1 MG tablet TAKE 1 TABLET EVERY DAY 08/19/21   Derek Jack, MD  gabapentin (NEURONTIN) 100 MG capsule TAKE 1 CAPSULE EVERY MORNING AND TAKE 2 CAPSULES AT BEDTIME 07/18/21   Fayrene Helper, MD  hydrocortisone cream 0.5 % Apply spaingly twice daily to rash on right cheek for 5 days, then as needed 05/01/21   Fayrene Helper, MD  magnesium oxide (MAG-OX) 400 (240 Mg) MG tablet TAKE 1 TABLET TWICE DAILY 10/30/20   Derek Jack, MD  methotrexate 2.5 MG tablet Take 6 tablets (15 mg total) by mouth once a week. Caution:Chemotherapy. Protect from light. 05/05/21   Marcial Pacas, MD  NON FORMULARY Fluid restriction of 1.8 liters per day (1713m total) 7-3 = 800 ml 3-11 = 800 ml 11-7 = 175 ml Document totals qshift. Every Shift Day, Evening, Nigh 08/07/19   [provider]  NON FORMULARY Diet: _____ Regular, __x____ NAS, _______Consistent Carbohydrate, _______NPO _____Other    [provider]  predniSONE (DELTASONE) 5 MG tablet Take 2 tablets (10 mg total) by mouth daily with breakfast. 05/05/21   YMarcial Pacas MD  solifenacin (VESICARE) 10 MG tablet Take 1 tablet (10 mg total) by mouth daily. 08/13/21   WIrine Seal MD  triamterene-hydrochlorothiazide (MAXZIDE-25) 37.5-25 MG tablet TAKE 1 TABLET EVERY DAY (DOSE REDUCTION) 09/16/20   SFayrene Helper MD  trospium (SANCTURA) 20 MG tablet Take 1  tablet (20 mg total) by mouth 2 (two) times daily. 07/31/21   Irine Seal, MD      Allergies    Cellcept [mycophenolate mofetil], Statins, Ciprofloxacin hcl, Hydrocodone, and Penicillins    Review of Systems   Review of Systems  Constitutional:  Positive for fatigue. Negative for fever.  HENT:  Negative for sore throat.   Eyes:  Negative for visual disturbance.  Respiratory:  Negative for shortness of breath.   Cardiovascular:  Positive for leg swelling. Negative for chest pain.  Gastrointestinal:  Negative for abdominal pain.  Genitourinary:  Negative for  dysuria.  Musculoskeletal:  Positive for gait problem. Negative for back pain and neck pain.  Skin:  Positive for wound. Negative for rash.  Neurological:  Negative for headaches.    Physical Exam Updated Vital Signs BP 131/72   Pulse 86   Temp 98.5 F (36.9 C) (Oral)   Resp (!) 21   Ht 5' 6"  (1.676 m)   Wt 77.1 kg   SpO2 96%   BMI 27.44 kg/m  Physical Exam Vitals and nursing note reviewed.  Constitutional:      General: She is not in acute distress.    Appearance: Normal appearance. She is well-developed.  HENT:     Head: Normocephalic and atraumatic.  Eyes:     Conjunctiva/sclera: Conjunctivae normal.  Cardiovascular:     Rate and Rhythm: Normal rate and regular rhythm.     Heart sounds: No murmur heard. Pulmonary:     Effort: Pulmonary effort is normal. No respiratory distress.     Breath sounds: Normal breath sounds.  Abdominal:     Palpations: Abdomen is soft.     Tenderness: There is no abdominal tenderness. There is no guarding or rebound.  Musculoskeletal:        General: No swelling. Normal range of motion.     Cervical back: Neck supple.     Right lower leg: Edema present.     Left lower leg: Edema present.  Skin:    General: Skin is warm and dry.     Capillary Refill: Capillary refill takes less than 2 seconds.  Neurological:     General: No focal deficit present.     Mental Status: She is alert.     ED Results / Procedures / Treatments   Labs (all labs ordered are listed, but only abnormal results are displayed) Labs Reviewed  SARS CORONAVIRUS 2 BY RT PCR  BASIC METABOLIC PANEL  CBC WITH DIFFERENTIAL/PLATELET  URINALYSIS, ROUTINE W REFLEX MICROSCOPIC  TROPONIN I (HIGH SENSITIVITY)    EKG None  Radiology No results found.  Procedures Procedures  {Document cardiac monitor, telemetry assessment procedure when appropriate:1}  Medications Ordered in ED Medications  sodium chloride 0.9 % bolus 500 mL (has no administration in time range)     ED Course/ Medical Decision Making/ A&P                           Medical Decision Making Amount and/or Complexity of Data Reviewed Labs: ordered. Radiology: ordered.  This patient complains of ***; this involves an extensive number of treatment Options and is a complaint that carries with it a high risk of complications and morbidity. The differential includes ***  I ordered, reviewed and interpreted labs, which included *** I ordered medication *** and reviewed PMP when indicated. I ordered imaging studies which included *** and I independently    visualized and interpreted imaging which  showed *** Additional history obtained from *** Previous records obtained and reviewed *** I consulted *** and discussed lab and imaging findings and discussed disposition.  Cardiac monitoring reviewed, *** Social determinants considered, *** Critical Interventions: ***  After the interventions stated above, I reevaluated the patient and found *** Admission and further testing considered, ***    {Document critical care time when appropriate:1} {Document review of labs and clinical decision tools ie heart score, Chads2Vasc2 etc:1}  {Document your independent review of radiology images, and any outside records:1} {Document your discussion with family members, caretakers, and with consultants:1} {Document social determinants of health affecting pt's care:1} {Document your decision making why or why not admission, treatments were needed:1} Final Clinical Impression(s) / ED Diagnoses Final diagnoses:  None    Rx / DC Orders ED Discharge Orders     None

## 2021-09-20 NOTE — ED Notes (Signed)
Date and time results received: 09/20/21 2042 (use smartphrase ".now" to insert current time)  Test: troponin Critical Value: 208  Name of Provider Notified: Carin Hock, MD

## 2021-09-20 NOTE — H&P (Addendum)
History and Physical    Patient: [name] [name] YYT:[id number] DOB: 1933/02/18 DOA: 09/20/2021 DOS: the patient was seen and examined on 09/20/2021 PCP: Fayrene Helper, MD  Patient coming from: Home  Chief Complaint:  Chief Complaint  Patient presents with   Fall   HPI: [name] [name] is a 88 y.o. female with medical history significant of left lower extremity DVT, hyperlipidemia, essential hypertension, Osteoarthritis, recurrent falls, bilateral venous stasis ulcers, lumbar spinal stenosis who presents to the emergency department via EMS from home after sustaining a fall.  Patient states that she slid out of bed last night and was unable to get up due to having generalized weakness, she was found by family on the floor today, EMS was activated and patient was taken to the ED for further evaluation and management.  She denies fever, chills, chest pain, shortness of breath, nausea or vomiting.  Patient lives alone and ambulates with a manual wheelchair.  She states that she has home health aide and family members who comes around to help with some chores.  She follows with her PCP for chronic leg swelling and wounds.  ED Course:  In the emergency department, she was intermittently tachypneic, BP was 145/77, other vital signs are within normal range.  Work-up in the ED showed normal CBC except for leukocytosis, BMP showed hypokalemia, BUN/creatinine 36/1.16 (baseline creatinine at 0.7-1.0).  CBG 140, troponin x2 -241 > 208.  Total CK 8,060. SARS coronavirus 2 was negative. Chest x-ray showed no active disease CT head without contrast showed no acute intracranial abnormality.  No skull fracture. IV hydration with 1 L NS and sodium bicarbonate was given.  Hospitalist was asked to admit patient for further evaluation and management.  Review of Systems: Review of systems as noted in the HPI. All other systems reviewed and are negative.   Past Medical History:  Diagnosis Date   Acute deep vein  thrombosis (DVT) of left femoral vein ([place]) 02/07/2019   Dx 02/05/2019   Arthritis    spinal stenosis   Deep vein thrombosis (DVT) of left lower extremity ([place]) 08/04/2019   Elevated LFTs    secondary to fatty liver, negative work-up in 2011   Hypercholesterolemia    Hypertension    Lumbar spinal stenosis 01/14/2018   L3-4 and L4-5   Myopathy 02/15/2018   Osteoarthritis    right knee   Peripheral neuropathy 07/06/2018   PONV (postoperative nausea and vomiting)    Uterine cancer ([place]) 08/2006   grade 1, no recurrence up to 2013   Past Surgical History:  Procedure Laterality Date   ABDOMINAL HYSTERECTOMY  2008   adenocarcinoma stage 1   APPENDECTOMY  1973   BREAST BIOPSY Left 2018   Benign   CHOLECYSTECTOMY  1973   COLONOSCOPY    06/21/2007   EXN:TZGYFV rectum/Sigmoid diverticula, diminutive hepatic flexure polyp s/p bx. Benign.    COLONOSCOPY N/A 10/17/2012   Dr. Oneida Alar- moderate diverticulosis was noted in the sigmoid colon, moderate sized internal hemorrhoids. next tcs in10 years   ESOPHAGOGASTRODUODENOSCOPY N/A 03/20/2016   Dr. Oneida Alar, widely patent Schatzki ring, anemia due to ASA induced erosive gastritis, mild duodenitis. gastric bx benign without H.pylori.    FOOT SURGERY  2007   Pins in toes on left foot, 5 to 6 yrs ago   [place] N/A 03/20/2016   Procedure: GIVENS CAPSULE STUDY;  Surgeon: Danie Binder, MD;  Location: [facility];  Service: Endoscopy;  Laterality: N/A;   MUSCLE BIOPSY  Left 06/07/2018   Procedure: LEFT THIGH MUSCLE BIOPSY;  Surgeon: Erroll Luna, MD;  Location: [facility];  Service: General;  Laterality: Left;   TONSILLECTOMY      Social History:  reports that she has never smoked. She has never used smokeless tobacco. She reports that she does not drink alcohol and does not use drugs.   Allergies  Allergen Reactions   Cellcept [Mycophenolate Mofetil]     Muscle cramps, mouth soreness, difficulty swallowing, bruising    Statins     Dx of in   Ciprofloxacin Hcl Other (See Comments)    Chills, sick, could not tolerate it   Hydrocodone Nausea Only   Penicillins Rash    Family History  Problem Relation Age of Onset   Heart disease Father    Bladder Cancer Brother        in remission    Hypertension Sister    Dementia Sister    Diabetes type II Sister    Hypertension Sister    Aneurysm Sister    Hypertension Brother    Arthritis Brother    Hypertension Son    Colon cancer Neg Hx      Prior to Admission medications   Medication Sig Start Date End Date Taking? Authorizing Provider  acetaminophen (TYLENOL) 650 MG CR tablet Take 650 mg by mouth every 6 (six) hours. 08/08/19  Yes [provider]  cholecalciferol (VITAMIN D3) 25 MCG (1000 UNIT) tablet Take 1 tablet (1,000 Units total) by mouth daily. Patient taking differently: Take 1,000 Units by mouth See admin instructions. Monday, Wednesday, and Friday 04/16/21  Yes Pennington, Collier Bullock, PA-C  ELIQUIS 5 MG TABS tablet TAKE 1 TABLET TWICE DAILY 09/10/21  Yes Derek Jack, MD  ezetimibe (ZETIA) 10 MG tablet TAKE 1 TABLET EVERY DAY 06/02/21  Yes Fayrene Helper, MD  folic acid (FOLVITE) 1 MG tablet TAKE 1 TABLET EVERY DAY 08/19/21  Yes Derek Jack, MD  gabapentin (NEURONTIN) 100 MG capsule TAKE 1 CAPSULE EVERY MORNING AND TAKE 2 CAPSULES AT BEDTIME 07/18/21  Yes Fayrene Helper, MD  hydrocortisone cream 0.5 % Apply spaingly twice daily to rash on right cheek for 5 days, then as needed 05/01/21  Yes Fayrene Helper, MD  magnesium oxide (MAG-OX) 400 (240 Mg) MG tablet TAKE 1 TABLET TWICE DAILY 10/30/20  Yes Derek Jack, MD  methotrexate 2.5 MG tablet Take 6 tablets (15 mg total) by mouth once a week. Caution:Chemotherapy. Protect from light. 05/05/21  Yes Marcial Pacas, MD  predniSONE (DELTASONE) 5 MG tablet Take 2 tablets (10 mg total) by mouth daily with breakfast. 05/05/21  Yes Marcial Pacas, MD   triamterene-hydrochlorothiazide (MAXZIDE-25) 37.5-25 MG tablet TAKE 1 TABLET EVERY DAY (DOSE REDUCTION) Patient taking differently: Take 1 tablet by mouth daily. 09/16/20  Yes Fayrene Helper, MD  NON FORMULARY Fluid restriction of 1.8 liters per day (1760m total) 7-3 = 800 ml 3-11 = 800 ml 11-7 = 175 ml Document totals qshift. Every Shift Day, Evening, Nigh 08/07/19   [provider]  NON FORMULARY Diet: _____ Regular, __x____ NAS, _______Consistent Carbohydrate, _______NPO _____Other    [provider]  solifenacin (VESICARE) 10 MG tablet Take 1 tablet (10 mg total) by mouth daily. 08/13/21   WIrine Seal MD  trospium (SANCTURA) 20 MG tablet Take 1 tablet (20 mg total) by mouth 2 (two) times daily. Patient not taking: Reported on 09/20/2021 07/31/21   WIrine Seal MD    Physical Exam: BP (!) 157/73 (  BP Location: Left Arm)   Pulse 84   Temp 97.6 F (36.4 C) (Oral)   Resp 18   Ht 5' 6"  (1.676 m)   Wt 77.1 kg   SpO2 98%   BMI 27.44 kg/m   General: 88 y.o. year-old female well developed well nourished in no acute distress.  Alert and oriented x3. HEENT: NCAT, EOMI Neck: Supple, trachea medial Cardiovascular: Regular rate and rhythm with no rubs or gallops.  No thyromegaly or JVD noted.  No lower extremity edema. 2/4 pulses in all 4 extremities. Respiratory: Clear to auscultation with no wheezes or rales. Good inspiratory effort. Abdomen: Soft, nontender nondistended with normal bowel sounds x4 quadrants. Muskuloskeletal: Bilateral lower extremity with Ace bandage.  +2 edema bilateral in the feet. Neuro: CN II-XII intact, strength 5/5 x 4, sensation, reflexes intact Skin: No ulcerative lesions noted or rashes Psychiatry: Judgement and insight appear normal. Mood is appropriate for condition and setting          Labs on Admission:  Basic Metabolic Panel: Recent Labs  Lab 09/20/21 1834  NA 140  K 3.4*  CL 101  CO2 28  GLUCOSE 140*  BUN 36*   CREATININE 1.16*  CALCIUM 9.0   Liver Function Tests: No results for input(s): "AST", "ALT", "ALKPHOS", "BILITOT", "PROT", "ALBUMIN" in the last 168 hours. No results for input(s): "LIPASE", "AMYLASE" in the last 168 hours. No results for input(s): "AMMONIA" in the last 168 hours. CBC: Recent Labs  Lab 09/20/21 1834  WBC 19.0*  NEUTROABS 17.6*  HGB 13.5  HCT 41.6  MCV 96.7  PLT 204   Cardiac Enzymes: Recent Labs  Lab 09/20/21 1834  CKTOTAL 8,060*    BNP (last 3 results) No results for input(s): "BNP" in the last 8760 hours.  ProBNP (last 3 results) No results for input(s): "PROBNP" in the last 8760 hours.  CBG: No results for input(s): "GLUCAP" in the last 168 hours.  Radiological Exams on Admission: DG Chest Port 1 View  Result Date: 09/20/2021 CLINICAL DATA:  Pt BIB [facility] they state last night pt "slid out of the bed" and was in the floor all night. Pt denies head injury or LOC. Pt denies chest pain and SOBPt c/o increased generalized weakness EXAM: PORTABLE CHEST 1 VIEW COMPARISON:  08/04/2019. FINDINGS: Cardiac silhouette normal in size.  No mediastinal or hilar masses. Clear lungs.  No pleural effusion or pneumothorax. Skeletal structures are grossly intact. IMPRESSION: No active disease. Electronically Signed   By: Lajean Manes M.D.   On: 09/20/2021 18:23   CT Head Wo Contrast  Result Date: 09/20/2021 CLINICAL DATA:  Head trauma, minor (Age >= 65y) Slid out of bed.  Generalized weakness EXAM: CT HEAD WITHOUT CONTRAST TECHNIQUE: Contiguous axial images were obtained from the base of the skull through the vertex without intravenous contrast. RADIATION DOSE REDUCTION: This exam was performed according to the departmental dose-optimization program which includes automated exposure control, adjustment of the mA and/or kV according to patient size and/or use of iterative reconstruction technique. COMPARISON:  Remote head CT 10/30/2003 FINDINGS: Brain: No intracranial  hemorrhage, mass effect, or midline shift. Brain volume is normal for age. No hydrocephalus. The basilar cisterns are patent. Mild periventricular chronic small vessel ischemia, mild for age. No evidence of territorial infarct or acute ischemia. Partially empty sella, typically incidental. No extra-axial or intracranial fluid collection. Vascular: Atherosclerosis of skullbase vasculature without hyperdense vessel or abnormal calcification. Skull: No fracture or focal lesion. Sinuses/Orbits: No acute finding. Other: None.  IMPRESSION: 1. No acute intracranial abnormality. No skull fracture. 2. Mild chronic small vessel ischemia. Electronically Signed   By: Keith Rake M.D.   On: 09/20/2021 18:23    EKG: I independently viewed the EKG done and my findings are as followed: Normal sinus rhythm at rate of 81 bpm with APCs  Assessment/Plan Present on Admission:  Rhabdomyolysis  Venous stasis ulcers (HCC)  Hypokalemia  Vitamin D deficiency  Essential hypertension  Hyperlipidemia LDL goal <100  Principal Problem:   Rhabdomyolysis Active Problems:   Hyperlipidemia LDL goal <100   Essential hypertension   Vitamin D deficiency   Venous stasis ulcers (HCC)   Hypokalemia   Weakness generalized   Recurrent falls   Leukocytosis   Chronic deep vein thrombosis (DVT) of distal vein of left lower extremity (HCC)   Elevated troponin   Osteoarthritis  Rhabdomyolysis CK 8,060, continue IV hydration Continue to monitor total CK  Recurrent falls Continue fall precaution and neurochecks Continue PT/OT eval and treat  Generalized weakness Continue fall precaution neurochecks Protein supplement to be provided  Leukocytosis possibly reactive Patient was chronically on prednisone WBC 19.0, but patient has no obvious acute infectious process This may be due to stress margination Continue to monitor WBC with morning labs  Hypokalemia K+ 3.4, this will be replenished  Elevated troponin possibly  secondary to type II demand ischemia Troponin 241 > 208; troponin is flattened out, patient denies any chest pain  Hyperglycemia possibly reactive CBG 140, patient has no history of diabetes mellitus Continue to monitor CBG with morning labs  Lower extremity DVT Continue Eliquis  Essential hypertension (controlled) Maxide will be temporarily held at this time due to current IV hydration  Mixed hyperlipidemia Continue Zetia  Bilateral venous stasis ulcer Bilateral leg wrapped with Ace bandage Continue wound care  Osteoarthritis/rheumatoid arthritis Continue Tylenol as needed Patient takes methotrexate weekly Continue prednisone per home regimen  Vitamin D deficiency Continue cholecalciferol  DVT prophylaxis: Eliquis  Code Status: Full code (confirmed with patient)  Consults: None  Family Communication: None at bedside  Severity of Illness: The appropriate patient status for this patient is INPATIENT. Inpatient status is judged to be reasonable and necessary in order to provide the required intensity of service to ensure the patient's safety. The patient's presenting symptoms, physical exam findings, and initial radiographic and laboratory data in the context of their chronic comorbidities is felt to place them at high risk for further clinical deterioration. Furthermore, it is not anticipated that the patient will be medically stable for discharge from the hospital within 2 midnights of admission.   * I certify that at the point of admission it is my clinical judgment that the patient will require inpatient hospital care spanning beyond 2 midnights from the point of admission due to high intensity of service, high risk for further deterioration and high frequency of surveillance required.*  Author: Bernadette Hoit, DO 09/20/2021 10:31 PM  For on call review [email or web address].

## 2021-09-20 NOTE — ED Triage Notes (Signed)
Pt BIB [facility] they state last night pt "slid out of the bed" and was in the floor all night. Pt denies head injury or LOC. EMS picked her up this AM, but pt refused transport. This afternoon pt started having generalized weakness. No unilateral weakness noted in triage.  Pt does take Elloquis. Pt is A/Ox4.

## 2021-09-21 DIAGNOSIS — M6282 Rhabdomyolysis: Secondary | ICD-10-CM | POA: Diagnosis not present

## 2021-09-21 DIAGNOSIS — E785 Hyperlipidemia, unspecified: Secondary | ICD-10-CM | POA: Diagnosis not present

## 2021-09-21 DIAGNOSIS — I10 Essential (primary) hypertension: Secondary | ICD-10-CM | POA: Diagnosis not present

## 2021-09-21 DIAGNOSIS — I825Z2 Chronic embolism and thrombosis of unspecified deep veins of left distal lower extremity: Secondary | ICD-10-CM | POA: Diagnosis not present

## 2021-09-21 LAB — COMPREHENSIVE METABOLIC PANEL
ALT: 60 U/L — ABNORMAL HIGH (ref 0–44)
AST: 156 U/L — ABNORMAL HIGH (ref 15–41)
Albumin: 2.8 g/dL — ABNORMAL LOW (ref 3.5–5.0)
Alkaline Phosphatase: 47 U/L (ref 38–126)
Anion gap: 4 — ABNORMAL LOW (ref 5–15)
BUN: 25 mg/dL — ABNORMAL HIGH (ref 8–23)
CO2: 30 mmol/L (ref 22–32)
Calcium: 8.2 mg/dL — ABNORMAL LOW (ref 8.9–10.3)
Chloride: 108 mmol/L (ref 98–111)
Creatinine, Ser: 0.79 mg/dL (ref 0.44–1.00)
GFR, Estimated: >60 mL/min (ref >60)
Glucose, Bld: 116 mg/dL — ABNORMAL HIGH (ref 70–99)
Potassium: 3.4 mmol/L — ABNORMAL LOW (ref 3.5–5.1)
Sodium: 142 mmol/L (ref 135–145)
Total Bilirubin: 1.1 mg/dL (ref 0.3–1.2)
Total Protein: 5.4 g/dL — ABNORMAL LOW (ref 6.5–8.1)

## 2021-09-21 LAB — CBC
HCT: 39.5 % (ref 36.0–46.0)
Hemoglobin: 12.5 g/dL (ref 12.0–15.0)
MCH: 30.6 pg (ref 26.0–34.0)
MCHC: 31.6 g/dL (ref 30.0–36.0)
MCV: 96.8 fL (ref 80.0–100.0)
Platelets: 170 10*3/uL (ref 150–400)
RBC: 4.08 MIL/uL (ref 3.87–5.11)
RDW: 14.5 % (ref 11.5–15.5)
WBC: 12.9 10*3/uL — ABNORMAL HIGH (ref 4.0–10.5)
nRBC: 0 % (ref 0.0–0.2)

## 2021-09-21 LAB — MAGNESIUM: Magnesium: 1.9 mg/dL (ref 1.7–2.4)

## 2021-09-21 LAB — CK: Total CK: 5285 U/L — ABNORMAL HIGH (ref 38–234)

## 2021-09-21 LAB — PHOSPHORUS: Phosphorus: 1.9 mg/dL — ABNORMAL LOW (ref 2.5–4.6)

## 2021-09-21 MED ORDER — SODIUM CHLORIDE 0.9 % IV SOLN: INTRAVENOUS | Status: AC

## 2021-09-21 MED ORDER — METHOCARBAMOL 500 MG PO TABS
500.0000 mg | ORAL_TABLET | Freq: Three times a day (TID) | ORAL | Status: AC
Start: 2021-09-21 — End: 2021-09-23
  Administered 2021-09-21 – 2021-09-23 (×9): 500 mg via ORAL
  Filled 2021-09-21 (×9): qty 1

## 2021-09-21 MED ORDER — POTASSIUM PHOSPHATES 15 MMOLE/5ML IV SOLN
15.0000 mmol | Freq: Once | INTRAVENOUS | Status: AC
  Administered 2021-09-21: 15 mmol via INTRAVENOUS
  Filled 2021-09-21: qty 5

## 2021-09-21 MED ORDER — POTASSIUM CHLORIDE CRYS ER 20 MEQ PO TBCR
40.0000 meq | EXTENDED_RELEASE_TABLET | Freq: Once | ORAL | Status: AC
  Administered 2021-09-21: 40 meq via ORAL
  Filled 2021-09-21: qty 2

## 2021-09-21 NOTE — Plan of Care (Signed)
  Problem: Education: Goal: Knowledge of General Education information will improve Description: Including pain rating scale, medication(s)/side effects and non-pharmacologic comfort measures Outcome: Progressing   Problem: Clinical Measurements: Goal: Ability to maintain clinical measurements within normal limits will improve Outcome: Progressing   

## 2021-09-21 NOTE — Progress Notes (Addendum)
PROGRESS NOTE     [name] [name], is a 88 y.o. female, DOB - 02/18/1933, TZG:[id number]  Admit date - 09/20/2021   Admitting Physician Bernadette Hoit, DO  Outpatient Primary MD for the patient is Fayrene Helper, MD  LOS - 1  Chief Complaint  Patient presents with   Fall        Brief Narrative:   88 y.o. female with medical history significant of left lower extremity DVT, HLD, HTN, Osteoarthritis, recurrent falls, bilateral venous stasis ulcers, lumbar spinal stenosis admitted on 09/20/2021 with another episode of fall and concern for rhabdomyolysis    -Assessment and Plan:  1)Rhabdomyolysis--- CKs peaked at 8060, currently trending down -Continue hydration  2)Recurrent Falls--myalgias -Denies syncope -CT head without acute findings -Await PT OT eval -Consider pelvic x-rays pending OT and PT eval -Most likely will need rehab  3)Hypokalemia--- replace and recheck  4)Leukocytosis--suspect reactive due to trauma/falls -WBC trended down -No evidence of acute infection at this time  5)Elevated Troponin--- Troponin 241 >>>208 most likely secondary to #1 above, troponin levels not concerning for ACS given levels of CKs -EKG sinus rhythm with LVH and PACs -Echo requested  6) history of DVT--- continue Eliquis  7)Bilateral venous stasis ulcer--Bilateral leg wrapped with Ace bandage -Home health nurse comes to patient's home couple times a week 2 to dressing changes  8)Osteoarthritis/rheumatoid arthritis Continue Tylenol as needed Patient takes methotrexate weekly Continue prednisone per home regimen -Add methocarbamol due to falls and stiffness  9)HTN BP stable, continue to hold Maxzide  Disposition/Need for in-Hospital Stay- patient unable to be discharged at this time due to -await physical therapy eval recurrent falls may need SNF rehab  Status is: Inpatient   Disposition: The patient is from: Home              Anticipated d/c is to:  SNF rehab discharge home  with home health              Anticipated d/c date is: 1 day              Patient currently is not medically stable to d/c. Barriers: Not Clinically Stable- \  Code Status :  -  Code Status: Full Code   Family Communication: Patient's son and daughter-in-law visited  DVT Prophylaxis  :   - SCDs   SCDs Start: 09/20/21 2222 apixaban (ELIQUIS) tablet 5 mg   Lab Results  Component Value Date   PLT 170 09/21/2021    Inpatient Medications  Scheduled Meds:  apixaban  5 mg Oral BID   cholecalciferol  1,000 Units Oral Daily   ezetimibe  10 mg Oral Daily   feeding supplement (GLUCERNA SHAKE)  237 mL Oral TID BM   methocarbamol  500 mg Oral TID   predniSONE  10 mg Oral Q breakfast   Continuous Infusions:  sodium chloride 75 mL/hr at 09/21/21 1118   potassium PHOSPHATE IVPB (in mmol) 15 mmol (09/21/21 1144)   PRN Meds:.acetaminophen **OR** acetaminophen   Anti-infectives (From admission, onward)    None         Subjective: Ines Bloomer today has no fevers, no emesis,  No chest pain,   - Complains of generalized aches and pains -Son and daughter-in-law visited -No headaches no vision disturbance no unilateral extremity weakness -  Objective: Vitals:   09/20/21 2214 09/21/21 0215 09/21/21 0551 09/21/21 1323  BP: (!) 157/73 (!) 146/72 (!) 145/72 118/65  Pulse: 84 85 78 96  Resp: 18 16 16  17  Temp: 97.6 F (36.4 C) 97.8 F (36.6 C) 97.9 F (36.6 C) 98.9 F (37.2 C)  TempSrc: Oral Oral Oral   SpO2: 98% 99% 97% 95%  Weight:      Height:        Intake/Output Summary (Last 24 hours) at 09/21/2021 1431 Last data filed at 09/21/2021 0900 Gross per 24 hour  Intake 947.59 ml  Output 500 ml  Net 447.59 ml   Filed Weights   09/20/21 1741  Weight: 77.1 kg    Physical Exam  Gen:- Awake Alert, no acute distress HEENT:- [place].AT, No sclera icterus Neck-Supple Neck,No JVD,.  Lungs-  CTAB , fair symmetrical air movement CV- S1, S2 normal, regular  Abd-  +ve B.Sounds, Abd  Soft, No tenderness,    Extremity/Skin:-  pedal pulses present , dressings and Ace wraps to bilateral lower extremities Psych-affect is appropriate, oriented x3 Neuro-generalized weakness, no new focal deficits, no tremors  Data Reviewed: I have personally reviewed following labs and imaging studies  CBC: Recent Labs  Lab 09/20/21 1834 09/21/21 0622  WBC 19.0* 12.9*  NEUTROABS 17.6*  --   HGB 13.5 12.5  HCT 41.6 39.5  MCV 96.7 96.8  PLT 204 622   Basic Metabolic Panel: Recent Labs  Lab 09/20/21 1834 09/21/21 0622  NA 140 142  K 3.4* 3.4*  CL 101 108  CO2 28 30  GLUCOSE 140* 116*  BUN 36* 25*  CREATININE 1.16* 0.79  CALCIUM 9.0 8.2*  MG  --  1.9  PHOS  --  1.9*   GFR: Estimated Creatinine Clearance: 51.9 mL/min (by C-G formula based on SCr of 0.79 mg/dL). Liver Function Tests: Recent Labs  Lab 09/21/21 0622  AST 156*  ALT 60*  ALKPHOS 47  BILITOT 1.1  PROT 5.4*  ALBUMIN 2.8*   Cardiac Enzymes: Recent Labs  Lab 09/20/21 1834 09/21/21 0622  CKTOTAL 8,060* 5,285*   BNP (last 3 results) No results for input(s): "PROBNP" in the last 8760 hours. HbA1C: No results for input(s): "HGBA1C" in the last 72 hours. Sepsis Labs: @LABRCNTIP (procalcitonin:4,lacticidven:4) ) Recent Results (from the past 240 hour(s))  SARS Coronavirus 2 by RT PCR (hospital order, performed in [facility] hospital lab) *cepheid single result test* Anterior Nasal Swab     Status: None   Collection Time: 09/20/21  6:34 PM   Specimen: Anterior Nasal Swab  Result Value Ref Range Status   SARS Coronavirus 2 by RT PCR NEGATIVE NEGATIVE Final    Comment: (NOTE) SARS-CoV-2 target nucleic acids are NOT DETECTED.  The SARS-CoV-2 RNA is generally detectable in upper and lower respiratory specimens during the acute phase of infection. The lowest concentration of SARS-CoV-2 viral copies this assay can detect is 250 copies / mL. A negative result does not preclude SARS-CoV-2 infection and  should not be used as the sole basis for treatment or other patient management decisions.  A negative result may occur with improper specimen collection / handling, submission of specimen other than nasopharyngeal swab, presence of viral mutation(s) within the areas targeted by this assay, and inadequate number of viral copies (<250 copies / mL). A negative result must be combined with clinical observations, patient history, and epidemiological information.  Fact Sheet for Patients:   https://www.patel.info/  Fact Sheet for Healthcare Providers: https://hall.com/  This test is not yet approved or  cleared by the [place] FDA and has been authorized for detection and/or diagnosis of SARS-CoV-2 by FDA under an Emergency Use Authorization (EUA).  This EUA will  remain in effect (meaning this test can be used) for the duration of the COVID-19 declaration under Section 564(b)(1) of the Act, 21 U.S.C. section 360bbb-3(b)(1), unless the authorization is terminated or revoked sooner.  Performed at [facility], [address] [place], [place] [address]       Radiology Studies: [facility] 1 View  Result Date: 09/20/2021 CLINICAL DATA:  Pt BIB [facility] they state last night pt "slid out of the bed" and was in the floor all night. Pt denies head injury or LOC. Pt denies chest pain and SOBPt c/o increased generalized weakness EXAM: PORTABLE CHEST 1 VIEW COMPARISON:  08/04/2019. FINDINGS: Cardiac silhouette normal in size.  No mediastinal or hilar masses. Clear lungs.  No pleural effusion or pneumothorax. Skeletal structures are grossly intact. IMPRESSION: No active disease. Electronically Signed   By: Lajean Manes M.D.   On: 09/20/2021 18:23   CT Head Wo Contrast  Result Date: 09/20/2021 CLINICAL DATA:  Head trauma, minor (Age >= 65y) Slid out of bed.  Generalized weakness EXAM: CT HEAD WITHOUT CONTRAST TECHNIQUE: Contiguous axial images were  obtained from the base of the skull through the vertex without intravenous contrast. RADIATION DOSE REDUCTION: This exam was performed according to the departmental dose-optimization program which includes automated exposure control, adjustment of the mA and/or kV according to patient size and/or use of iterative reconstruction technique. COMPARISON:  Remote head CT 10/30/2003 FINDINGS: Brain: No intracranial hemorrhage, mass effect, or midline shift. Brain volume is normal for age. No hydrocephalus. The basilar cisterns are patent. Mild periventricular chronic small vessel ischemia, mild for age. No evidence of territorial infarct or acute ischemia. Partially empty sella, typically incidental. No extra-axial or intracranial fluid collection. Vascular: Atherosclerosis of skullbase vasculature without hyperdense vessel or abnormal calcification. Skull: No fracture or focal lesion. Sinuses/Orbits: No acute finding. Other: None. IMPRESSION: 1. No acute intracranial abnormality. No skull fracture. 2. Mild chronic small vessel ischemia. Electronically Signed   By: Keith Rake M.D.   On: 09/20/2021 18:23     Scheduled Meds:  apixaban  5 mg Oral BID   cholecalciferol  1,000 Units Oral Daily   ezetimibe  10 mg Oral Daily   feeding supplement (GLUCERNA SHAKE)  237 mL Oral TID BM   methocarbamol  500 mg Oral TID   predniSONE  10 mg Oral Q breakfast   Continuous Infusions:  sodium chloride 75 mL/hr at 09/21/21 1118   potassium PHOSPHATE IVPB (in mmol) 15 mmol (09/21/21 1144)     LOS: 1 day    Roxan Hockey M.D on 09/21/2021 at 2:31 PM  Go to [email or web address] - for contact info  [facility] - Office  [phone]  If 7PM-7AM, please contact night-coverage [email or web address] 09/21/2021, 2:31 PM

## 2021-09-21 NOTE — Plan of Care (Signed)

## 2021-09-22 ENCOUNTER — Inpatient Hospital Stay (HOSPITAL_COMMUNITY): Payer: Medicare HMO

## 2021-09-22 DIAGNOSIS — R9431 Abnormal electrocardiogram [ECG] [EKG]: Secondary | ICD-10-CM | POA: Diagnosis not present

## 2021-09-22 DIAGNOSIS — M6282 Rhabdomyolysis: Secondary | ICD-10-CM | POA: Diagnosis not present

## 2021-09-22 DIAGNOSIS — D72829 Elevated white blood cell count, unspecified: Secondary | ICD-10-CM | POA: Diagnosis not present

## 2021-09-22 DIAGNOSIS — R778 Other specified abnormalities of plasma proteins: Secondary | ICD-10-CM | POA: Diagnosis not present

## 2021-09-22 DIAGNOSIS — I825Z2 Chronic embolism and thrombosis of unspecified deep veins of left distal lower extremity: Secondary | ICD-10-CM | POA: Diagnosis not present

## 2021-09-22 LAB — ECHOCARDIOGRAM COMPLETE
AR max vel: 1.01 cm2
AV Area VTI: 1.05 cm2
AV Area mean vel: 0.91 cm2
AV Mean grad: 8.5 mmHg
AV Peak grad: 14.5 mmHg
Ao pk vel: 1.91 m/s
Area-P 1/2: 3.27 cm2
Calc EF: 57.4 %
Height: 66 in
MV VTI: 1.54 cm2
S' Lateral: 2.9 cm
Single Plane A2C EF: 56.2 %
Single Plane A4C EF: 57.8 %
Weight: 2720 oz

## 2021-09-22 LAB — RENAL FUNCTION PANEL
Albumin: 2.6 g/dL — ABNORMAL LOW (ref 3.5–5.0)
Anion gap: 3 — ABNORMAL LOW (ref 5–15)
BUN: 19 mg/dL (ref 8–23)
CO2: 26 mmol/L (ref 22–32)
Calcium: 8 mg/dL — ABNORMAL LOW (ref 8.9–10.3)
Chloride: 110 mmol/L (ref 98–111)
Creatinine, Ser: 0.69 mg/dL (ref 0.44–1.00)
GFR, Estimated: >60 mL/min (ref >60)
Glucose, Bld: 101 mg/dL — ABNORMAL HIGH (ref 70–99)
Phosphorus: 1.9 mg/dL — ABNORMAL LOW (ref 2.5–4.6)
Potassium: 3.6 mmol/L (ref 3.5–5.1)
Sodium: 139 mmol/L (ref 135–145)

## 2021-09-22 LAB — CK: Total CK: 2044 U/L — ABNORMAL HIGH (ref 38–234)

## 2021-09-22 MED ORDER — NYSTATIN 100000 UNIT/GM EX POWD
Freq: Two times a day (BID) | CUTANEOUS | Status: DC
  Filled 2021-09-22: qty 15

## 2021-09-22 MED ORDER — OXYCODONE HCL 5 MG PO TABS: 5.0000 mg | ORAL_TABLET | Freq: Four times a day (QID) | ORAL | Status: DC | PRN

## 2021-09-22 MED ORDER — POTASSIUM CHLORIDE CRYS ER 20 MEQ PO TBCR
40.0000 meq | EXTENDED_RELEASE_TABLET | Freq: Once | ORAL | Status: AC
  Administered 2021-09-22: 40 meq via ORAL
  Filled 2021-09-22: qty 2

## 2021-09-22 MED ORDER — GABAPENTIN 100 MG PO CAPS
200.0000 mg | ORAL_CAPSULE | Freq: Three times a day (TID) | ORAL | Status: DC
  Administered 2021-09-22 – 2021-09-24 (×7): 200 mg via ORAL
  Filled 2021-09-22 (×7): qty 2

## 2021-09-22 NOTE — Evaluation (Signed)
Physical Therapy Evaluation Patient Details Name: [name] [name] MRN: [id number] DOB: Apr 11, 1932 Today's Date: 09/22/2021  History of Present Illness  [name] [name] is a 88 y.o. female with medical history significant of left lower extremity DVT, hyperlipidemia, essential hypertension, Osteoarthritis, recurrent falls, bilateral venous stasis ulcers, lumbar spinal stenosis who presents to the emergency department via EMS from home after sustaining a fall.  Patient states that she slid out of bed last night and was unable to get up due to having generalized weakness, she was found by family on the floor today, EMS was activated and patient was taken to the ED for further evaluation and management.  She denies fever, chills, chest pain, shortness of breath, nausea or vomiting.  Patient lives alone and ambulates with a manual wheelchair.  She states that she has home health aide and family members who comes around to help with some chores.  She follows with her PCP for chronic leg swelling and wounds.   Clinical Impression  Patient presents supine in bed and consents to PT evaluation. Patient is min/mod assist with bed mobility. LE coordination assist provided by PT due to generalized weakness and L LE discomfort. Demonstrates trunk control deficits with needing extended time to reach sitting position. Slow labored movement observed with verbal cueing provided. Patient is min assist with sit to stand and use of RW. Has difficulty reaching full extension with L LE but able to reach close to [facility] of extension with assist before fatigue begins in which patient has difficulty maintaining upright position. Mod assist with bed to chair transfer. Moderate balance deficits with impaired coordination leading to B LE fatiguing quickly and causing B LE to buckle. Patient limited to a few steps in room with balance deficits causing difficulty in gait pattern with weight shifting. Patient tolerated sitting up in chair after  therapy with nursing notified of mobility status. Patient will benefit from continued skilled physical therapy in hospital and recommended venue below to increase strength, balance, endurance for safe ADLs and gait.      Recommendations for follow up therapy are one component of a multi-disciplinary discharge planning process, led by the attending physician.  Recommendations may be updated based on patient status, additional functional criteria and insurance authorization.  Follow Up Recommendations Skilled nursing-short term rehab (<3 hours/day) Can patient physically be transported by private vehicle: Yes    Assistance Recommended at Discharge Set up Supervision/Assistance  Patient can return home with the following  A little help with walking and/or transfers;Help with stairs or ramp for entrance;Assistance with cooking/housework;A little help with bathing/dressing/bathroom    Equipment Recommendations None recommended by PT  Recommendations for Other Services       Functional Status Assessment Patient has had a recent decline in their functional status and demonstrates the ability to make significant improvements in function in a reasonable and predictable amount of time.     Precautions / Restrictions Precautions Precautions: Fall Restrictions Weight Bearing Restrictions: No      Mobility  Bed Mobility Overal bed mobility: Needs Assistance Bed Mobility: Supine to Sit     Supine to sit: Min assist, Mod assist     General bed mobility comments: Min/mod assist with bed mobility. LE assist required due to generalized weakness and discomfort. Trunk control deficits with needing extended time to sit up. Slow labored movement with verbal cueing needed.    Transfers Overall transfer level: Needs assistance Equipment used: Rolling walker (2 wheels) Transfers: Sit to/from Stand, Bed to  chair/wheelchair/BSC Sit to Stand: Min assist   Step pivot transfers: Mod assist        General transfer comment: Min assist with STS and use of RW. Difficulty fully extending L LE due to discomfort but able to lock out with assist. Difficulty getting into upright position. Mod assist with bed to chair transfer. Moderate balance deficits with impaired coordination leading to B LE quickly.    Ambulation/Gait Ambulation/Gait assistance: Mod assist Gait Distance (Feet): 4 Feet Assistive device: Rolling walker (2 wheels) Gait Pattern/deviations: Decreased step length - right, Decreased step length - left, Trunk flexed, Decreased stride length, Knees buckling, Knee flexed in stance - left Gait velocity: decreased     General Gait Details: Limited to a few steps with dificulty in coordination along with balance deficits impacting gait pattern and ability to weight shift  Stairs            Wheelchair Mobility    Modified Rankin (Stroke Patients Only)       Balance Overall balance assessment: Needs assistance Sitting-balance support: Bilateral upper extremity supported, Feet supported Sitting balance-Leahy Scale: Fair Sitting balance - Comments: seated EOB with UE support   Standing balance support: Bilateral upper extremity supported, During functional activity, Reliant on assistive device for balance Standing balance-Leahy Scale: Fair Standing balance comment: Poor to fair standing balance with B LE quickly fatiguing and unable to maintain stance. Reliant on RW and trunk stability.                             Pertinent Vitals/Pain Pain Assessment Pain Assessment: No/denies pain    Home Living Family/patient expects to be discharged to:: Private residence Living Arrangements: Alone Available Help at Discharge: Family;Available PRN/intermittently Type of Home: House Home Access: Stairs to enter Entrance Stairs-Rails: Psychiatric nurse of Steps: 5   Home Layout: One level Home Equipment: Conservation officer, nature (2 wheels);Grab bars -  tub/shower;Shower seat;BSC/3in1      Prior Function Prior Level of Function : Needs assist       Physical Assist : ADLs (physical)   ADLs (physical): IADLs Mobility Comments: Patient reports ambulating at home primarily in wheelchair. Uses RW with assist from aides. Not a community ambulator and not driving currently ADLs Comments: Patient needs assist with ADL's with help from home aides who come for a couple of hours a day M-F.     Hand Dominance        Extremity/Trunk Assessment   Upper Extremity Assessment Upper Extremity Assessment: Generalized weakness    Lower Extremity Assessment Lower Extremity Assessment: Generalized weakness    Cervical / Trunk Assessment Cervical / Trunk Assessment: Normal  Communication   Communication: No difficulties  Cognition Arousal/Alertness: Awake/alert Behavior During Therapy: WFL for tasks assessed/performed Overall Cognitive Status: Within Functional Limits for tasks assessed                                          General Comments      Exercises     Assessment/Plan    PT Assessment Patient needs continued PT services;All further PT needs can be met in the next venue of care  PT Problem List Decreased strength;Decreased activity tolerance;Decreased balance;Decreased mobility;Decreased coordination       PT Treatment Interventions DME instruction;Gait training;Functional mobility training;Therapeutic activities;Therapeutic exercise;Balance training    PT Goals (Current goals  can be found in the Care Plan section)  Acute Rehab PT Goals Patient Stated Goal: return home PT Goal Formulation: With patient Time For Goal Achievement: 10/06/21 Potential to Achieve Goals: Good    Frequency Min 3X/week     Co-evaluation               AM-PAC PT "6 Clicks" Mobility  Outcome Measure Help needed turning from your back to your side while in a flat bed without using bedrails?: A Little Help needed  moving from lying on your back to sitting on the side of a flat bed without using bedrails?: A Lot Help needed moving to and from a bed to a chair (including a wheelchair)?: A Lot Help needed standing up from a chair using your arms (e.g., wheelchair or bedside chair)?: A Lot Help needed to walk in hospital room?: A Lot Help needed climbing 3-5 steps with a railing? : Total 6 Click Score: 12    End of Session   Activity Tolerance: Patient tolerated treatment well;Patient limited by fatigue;Patient limited by pain Patient left: in chair;with call bell/phone within reach Nurse Communication: Mobility status PT Visit Diagnosis: Unsteadiness on feet (R26.81);Other abnormalities of gait and mobility (R26.89);Muscle weakness (generalized) (M62.81)    Time: 6789-3810 PT Time Calculation (min) (ACUTE ONLY): 23 min   Charges:   PT Evaluation $PT Eval Moderate Complexity: 1 Mod PT Treatments $Therapeutic Activity: 23-37 mins        1:59 PM, 09/22/21 Lestine Box, S/PT

## 2021-09-22 NOTE — Plan of Care (Signed)
  Problem: Acute Rehab OT Goals (only OT should resolve) Goal: Pt. Will Perform Eating Flowsheets (Taken 09/22/2021 1527) Pt Will Perform Eating:  with modified independence  sitting Goal: Pt. Will Perform Grooming Flowsheets (Taken 09/22/2021 1527) Pt Will Perform Grooming:  with modified independence  sitting Goal: Pt. Will Transfer To Toilet Flowsheets (Taken 09/22/2021 1527) Pt Will Transfer to Toilet:  with min assist  stand pivot transfer  regular height toilet  bedside commode Goal: Pt. Will Perform Toileting-Clothing Manipulation Flowsheets (Taken 09/22/2021 1527) Pt Will Perform Toileting - Clothing Manipulation and hygiene:  with supervision  sitting/lateral leans Goal: Pt/Caregiver Will Perform Home Exercise Program Flowsheets (Taken 09/22/2021 1527) Pt/caregiver will Perform Home Exercise Program:  Increased strength  Both right and left upper extremity  With Supervision  With written HEP provided

## 2021-09-22 NOTE — Evaluation (Signed)
Occupational Therapy Evaluation Patient Details Name: [name] [name] MRN: [id number] DOB: 1933-02-20 Today's Date: 09/22/2021   History of Present Illness [name] [name] is a 88 y.o. female with medical history significant of left lower extremity DVT, hyperlipidemia, essential hypertension, Osteoarthritis, recurrent falls, bilateral venous stasis ulcers, lumbar spinal stenosis who presents to the emergency department via EMS from home after sustaining a fall.  Patient states that she slid out of bed last night and was unable to get up due to having generalized weakness, she was found by family on the floor today, EMS was activated and patient was taken to the ED for further evaluation and management.  She denies fever, chills, chest pain, shortness of breath, nausea or vomiting.  Patient lives alone and ambulates with a manual wheelchair.  She states that she has home health aide and family members who comes around to help with some chores.  She follows with her PCP for chronic leg swelling and wounds.   Clinical Impression   Pt agreeable to OT evaluation this am, reports she has been up in the recliner today, son present for evaluation. Pt sitting up at bedside with min assist, OT notes limited BUE strength impacting ability to push up from sitting and push into walker for standing/stability. Pt able to use gait belt as leg lifter to return to supine position independently. Recommend SNF on discharge to improve ability to perform ADLs safely and independently.      Recommendations for follow up therapy are one component of a multi-disciplinary discharge planning process, led by the attending physician.  Recommendations may be updated based on patient status, additional functional criteria and insurance authorization.   Follow Up Recommendations  Skilled nursing-short term rehab (<3 hours/day)    Assistance Recommended at Discharge Frequent or constant Supervision/Assistance  Patient can return home with  the following A lot of help with walking and/or transfers;Two people to help with walking and/or transfers;A lot of help with bathing/dressing/bathroom;Assistance with cooking/housework;Assist for transportation;Help with stairs or ramp for entrance    Functional Status Assessment  Patient has had a recent decline in their functional status and demonstrates the ability to make significant improvements in function in a reasonable and predictable amount of time.  Equipment Recommendations  None recommended by OT    Recommendations for Other Services       Precautions / Restrictions Precautions Precautions: Fall Restrictions Weight Bearing Restrictions: No      Mobility Bed Mobility Overal bed mobility: Needs Assistance Bed Mobility: Supine to Sit, Sit to Supine     Supine to sit: Min assist (to bring RLE to EOB due to wrap getting stuck on sheet) Sit to supine: Supervision   General bed mobility comments: Pt using gait belt as leg lifter to bring BLE into the bed and shift them over to the middle    Transfers Overall transfer level: Needs assistance Equipment used: Rolling walker (2 wheels) Transfers: Sit to/from Stand Sit to Stand: Mod assist           General transfer comment: Attempted sit to standing, pt with significant RUE weakness limiting ability to push down on walker to fully stand          ADL either performed or assessed with clinical judgement   ADL Overall ADL's : Needs assistance/impaired     Grooming: Modified independent;Sitting Grooming Details (indicate cue type and reason): pt unable to standing to mobilize to sink or bathroom, reports she sits in wheelchair at sink at  home         Upper Body Dressing : Minimal assistance;Sitting Upper Body Dressing Details (indicate cue type and reason): min assist for managing gown buttons and ties, seated, donning clean gown then doffing soiled gown. Pt managing arm holes independently                    General ADL Comments: Pt appears to be near baseline for ADLs-RUE weakness limiting functional use when reaching for objects.     Vision Baseline Vision/History: 0 No visual deficits Ability to See in Adequate Light: 0 Adequate Patient Visual Report: No change from baseline Vision Assessment?: No apparent visual deficits            Pertinent Vitals/Pain Pain Assessment Pain Assessment: No/denies pain     Hand Dominance Right   Extremity/Trunk Assessment Upper Extremity Assessment Upper Extremity Assessment: Generalized weakness;RUE deficits/detail RUE Deficits / Details: RC pathology for RUE weakness, strength 2+/5, ROM at <50%   Lower Extremity Assessment Lower Extremity Assessment: Generalized weakness   Cervical / Trunk Assessment Cervical / Trunk Assessment: Normal   Communication Communication Communication: No difficulties   Cognition Arousal/Alertness: Awake/alert Behavior During Therapy: WFL for tasks assessed/performed Overall Cognitive Status: Within Functional Limits for tasks assessed                                                  Home Living Family/patient expects to be discharged to:: Private residence Living Arrangements: Alone Available Help at Discharge: Family;Available PRN/intermittently Type of Home: House Home Access: Stairs to enter [employer] of Steps: 5 Entrance Stairs-Rails: Right;Left Home Layout: One level     Bathroom Shower/Tub: Tub/shower unit;Sponge bathes at baseline   Bathroom Toilet: Standard Bathroom Accessibility: Yes   Home Equipment: Conservation officer, nature (2 wheels);Grab bars - tub/shower;Shower seat;BSC/3in1;Wheelchair - manual          Prior Functioning/Environment Prior Level of Function : Needs assist       Physical Assist : ADLs (physical)   ADLs (physical): IADLs Mobility Comments: Patient reports ambulating at home primarily in wheelchair. Uses RW with assist from aides.  Not a community ambulator and not driving currently ADLs Comments: Patient needs assist with ADL's with help from home aides who come for a couple of hours a day M-F. Performs sliding transfers between surfaces        OT Problem List: Decreased strength;Decreased activity tolerance;Impaired balance (sitting and/or standing);Decreased safety awareness;Decreased knowledge of use of DME or AE;Impaired UE functional use      OT Treatment/Interventions: Self-care/ADL training;Therapeutic exercise;DME and/or AE instruction;Therapeutic activities;Patient/family education    OT Goals(Current goals can be found in the care plan section) Acute Rehab OT Goals Patient Stated Goal: To get stronger and go home OT Goal Formulation: With patient Time For Goal Achievement: 10/06/21 Potential to Achieve Goals: Good  OT Frequency: Min 1X/week          End of Session Equipment Utilized During Treatment: Gait belt;Rolling walker (2 wheels)  Activity Tolerance: Patient tolerated treatment well Patient left: in bed;with call bell/phone within reach;with family/visitor present  OT Visit Diagnosis: Muscle weakness (generalized) (M62.81);History of falling (Z91.81)                Time: 3570-1779 OT Time Calculation (min): 23 min Charges:  OT General Charges $OT Visit: 1 Visit OT Evaluation $  OT Eval Low Complexity: [place], OTR/L  [phone] 09/22/2021, 2:29 PM

## 2021-09-22 NOTE — Plan of Care (Signed)
  Problem: Acute Rehab PT Goals(only PT should resolve) Goal: Pt Will Go Supine/Side To Sit Outcome: Progressing Flowsheets (Taken 09/22/2021 1359) Pt will go Supine/Side to Sit:  with min guard assist  with minimal assist Goal: Patient Will Transfer Sit To/From Stand Outcome: Progressing Flowsheets (Taken 09/22/2021 1359) Patient will transfer sit to/from stand:  with min guard assist  with minimal assist Goal: Pt Will Transfer Bed To Chair/Chair To Bed Outcome: Progressing Flowsheets (Taken 09/22/2021 1359) Pt will Transfer Bed to Chair/Chair to Bed:  with min assist  with mod assist Goal: Pt Will Perform Standing Balance Or Pre-Gait Outcome: Progressing Flowsheets (Taken 09/22/2021 1359) Pt will perform standing balance or pre-gait:  3- 5 min  with bilateral UE support  with minimal assist Goal: Pt Will Ambulate Outcome: Progressing Flowsheets (Taken 09/22/2021 1359) Pt will Ambulate:  50 feet  with minimal assist  with moderate assist  with rolling walker   2:00 PM, 09/22/21 Lestine Box, S/PT

## 2021-09-22 NOTE — Progress Notes (Signed)
*  PRELIMINARY RESULTS* Echocardiogram 2D Echocardiogram has been performed.  [name] [name] 09/22/2021, 12:49 PM

## 2021-09-22 NOTE — TOC Initial Note (Signed)
Transition of Care [facility]) - Initial/Assessment Note    Patient Details  Name: [name] [name] MRN: [id number] Date of Birth: January 25, 1933  [facility] CM/SW Contact:    Salome Arnt, LCSW Phone Number: 09/22/2021, 11:09 AM  Clinical Narrative: Pt admitted due to rhabdomyolysis. 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 She has all DME in the home and is active with [facility]. PT evaluated pt and recommend SNF. Discussed with pt who states she has been to SNF in the past. She wants to return home. Discussed concerns about recent falls and pt admits she is afraid of falling again, but still wants to go home and resume home health. LCSW asked if pt could have increased support in the home and she said they are working on that. Pt plans to discuss d/c plan with family. Per Cindie with [facility], okay to add HHPT to orders. MD updated. Pt aware to notify LCSW if she changes her mind and would be agreeable to SNF.                  Expected Discharge Plan: [place] Barriers to Discharge: Continued Medical Work up   Patient Goals and CMS Choice Patient states their goals for this hospitalization and ongoing recovery are:: return home   Choice offered to / list presented to : Patient  Expected Discharge Plan and Services Expected Discharge Plan: [place] In-house Referral: Clinical Social Work   Post Acute Care Choice: Resumption of Svcs/PTA Provider Living arrangements for the past 2 months: Single Family Home                           HH Arranged: RN, PT [place] Agency: [place] Date [facility] Agency Contacted: 09/22/21 Time [place]: 1109 Representative spoke with at [place]: Cindie  Prior Living Arrangements/Services Living arrangements for the past 2 months: [place] with:: Self Patient language and need for interpreter reviewed:: Yes Do you feel safe going  back to the place where you live?: Yes          Current home services: DME, Home RN (cane, walker, wheelchair, 3N1) Criminal Activity/Legal Involvement Pertinent to Current Situation/Hospitalization: No - Comment as needed  Activities of Daily Living Home Assistive Devices/Equipment: Environmental consultant (specify type), Wheelchair ADL Screening (condition at time of admission) Patient's cognitive ability adequate to safely complete daily activities?: Yes Is the patient deaf or have difficulty hearing?: No Does the patient have difficulty seeing, even when wearing glasses/contacts?: No Does the patient have difficulty concentrating, remembering, or making decisions?: No Patient able to express need for assistance with ADLs?: Yes Does the patient have difficulty dressing or bathing?: Yes Independently performs ADLs?: No Communication: Independent Dressing (OT): Needs assistance Is this a change from baseline?: Pre-admission baseline Grooming: Independent Feeding: Independent Bathing: Needs assistance Is this a change from baseline?: Pre-admission baseline Toileting: Needs assistance Is this a change from baseline?: Pre-admission baseline In/Out Bed: Needs assistance Is this a change from baseline?: Pre-admission baseline Walks in Home: Dependent Is this a change from baseline?: Pre-admission baseline Does the patient have difficulty walking or climbing stairs?: Yes Weakness of Legs: Both Weakness of Arms/Hands: None  Permission Sought/Granted   Permission granted to share information with : Yes, Verbal Permission Granted     Permission granted to share info w AGENCY: [name]  Permission granted to share info w Relationship: Home health     Emotional Assessment     Affect (typically observed): Appropriate Orientation: : Oriented to Self, Oriented to  Time, Oriented to Place, Oriented to Situation Alcohol / Substance Use: Not Applicable Psych Involvement: No (comment)  Admission  diagnosis:  Rhabdomyolysis [M62.82] Elevated troponin [R77.8] Fall, initial encounter [W19.XXXA] Traumatic rhabdomyolysis, initial encounter ([place]) [T79.6XXA] Patient Active Problem List   Diagnosis Date Noted   Rhabdomyolysis 09/20/2021   Recurrent falls 09/20/2021   Leukocytosis 09/20/2021   Chronic deep vein thrombosis (DVT) of distal vein of left lower extremity ([place]) 09/20/2021   Elevated troponin 09/20/2021   Osteoarthritis 09/20/2021   Paresthesia 05/05/2021   Gait abnormality 05/05/2021   Dermatitis 05/01/2021   Leg ulcer, left ([place]) 03/25/2021   HZV (herpes zoster virus) post herpetic neuralgia 05/31/2020   Zoster 04/30/2020   Hypomagnesemia 08/11/2019   Weakness generalized 08/09/2019   Lower extremity edema 08/06/2019   Bilateral lower extremity edema 08/04/2019   Hypokalemia 08/04/2019   Hypoalbuminemia 08/04/2019   Venous stasis ulcers (HCC) 04/03/2019   Lower leg edema 03/22/2019   At high risk for falls per [name] fall risk assessment scale 02/09/2019   Neck pain, bilateral posterior 01/13/2019   Statins contraindicated 08/14/2018   Peripheral neuropathy 07/06/2018   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   Lumbar spinal stenosis 01/14/2018   GERD (gastroesophageal reflux disease) 06/17/2016   Normocytic anemia    Sciatica of left side associated with disorder of lumbosacral spine 11/29/2014   Chronic constipation 10/04/2012   Urinary incontinence 06/20/2012   Vitamin D deficiency 08/22/2011   Prediabetes 05/29/2011   NASH (nonalcoholic steatohepatitis) 11/05/2009   Arthritis of right knee 09/04/2009   Hyperlipidemia LDL goal <100 09/08/2007   Essential hypertension 09/08/2007   PCP:  Fayrene Helper, MD Pharmacy:   [place], [place] - [address] [place] [address] [place] [address] Phone: [phone] Fax: [phone]  [facility] [place], [place] [address] Phone: [phone] Fax: [phone]     Social Determinants of Health (SDOH) Interventions    Readmission Risk Interventions     No data to display

## 2021-09-22 NOTE — Progress Notes (Signed)
PROGRESS NOTE     [name] [name], is a 88 y.o. female, DOB - 01-16-1933, ZOX:[id number]  Admit date - 09/20/2021   Admitting Physician Bernadette Hoit, DO  Outpatient Primary MD for the patient is Fayrene Helper, MD  LOS - 2  Chief Complaint  Patient presents with   Fall        Brief Narrative:   88 y.o. female with medical history significant of left lower extremity DVT, HLD, HTN, Osteoarthritis, recurrent falls, bilateral venous stasis ulcers, lumbar spinal stenosis admitted on 09/20/2021 with another episode of fall and concern for rhabdomyolysis    -Assessment and Plan:  1)Rhabdomyolysis--- CKs peaked at 8060, continues to trend down -Continue hydration  2)Recurrent Falls--myalgias -Denies syncope -CT head without acute findings -Hip and pelvic x-rays without fractures -PT eval appreciated recommends SNF rehab -Consider pelvic x-rays pending OT and PT eval -Most likely will need rehab  3)Hypokalemia--- resolved  4)Leukocytosis--suspect reactive due to trauma/falls, also patient is on steroids for RA -WBC trended down -No evidence of acute infection at this time  5)HFpEF/chronic diastolic dysfunction CHF elevated Troponin--- Troponin 241 >>>208 most likely secondary to #1 above, troponin levels not concerning for ACS given levels of CKs -EKG sinus rhythm with LVH and PACs -Echo with EF of 55%, no wall motion abnormalities, grade 1 diastolic dysfunction noted -Appears compensated, no acute exacerbation of CHF  6) history of DVT--- continue Eliquis  7)Bilateral venous stasis ulcer--Bilateral leg wrapped with Ace bandage -Home health nurse comes to patient's home couple times a week for  dressing changes -Wound care consult requested  8)Osteoarthritis/rheumatoid arthritis Continue Tylenol as needed Patient takes methotrexate weekly Continue prednisone per home regimen -Continue methocarbamol due to falls and stiffness  9)HTN BP stable, continue to hold  Maxzide  Disposition/Need for in-Hospital Stay- patient unable to be discharged at this time due to -await physical therapy eval recurrent falls may need SNF rehab  Status is: Inpatient   Disposition: The patient is from: Home              Anticipated d/c is to:  SNF rehab discharge               Anticipated d/c date is: 1 day              Patient currently is not medically stable to d/c. Barriers: Not Clinically Stable- \  Code Status :  -  Code Status: Full Code   Family Communication: No family at bedside  DVT Prophylaxis  :   - SCDs   SCDs Start: 09/20/21 2222 apixaban (ELIQUIS) tablet 5 mg   Lab Results  Component Value Date   PLT 170 09/21/2021    Inpatient Medications  Scheduled Meds:  apixaban  5 mg Oral BID   cholecalciferol  1,000 Units Oral Daily   ezetimibe  10 mg Oral Daily   feeding supplement (GLUCERNA SHAKE)  237 mL Oral TID BM   gabapentin  200 mg Oral TID   methocarbamol  500 mg Oral TID   nystatin   Topical BID   potassium chloride  40 mEq Oral Once   predniSONE  10 mg Oral Q breakfast   Continuous Infusions:  sodium chloride 75 mL/hr at 09/22/21 1748   PRN Meds:.acetaminophen **OR** acetaminophen, oxyCODONE   Anti-infectives (From admission, onward)    None        Subjective: [name] [name] today has no fevers, no emesis,  No chest pain,   - = Resting comfortably -Cooperative  with physical therapist -  Objective: Vitals:   09/21/21 0551 09/21/21 1323 09/21/21 2135 09/22/21 1416  BP: (!) 145/72 118/65 (!) 158/80 119/73  Pulse: 78 96 91 92  Resp: 16 17 14 16   Temp: 97.9 F (36.6 C) 98.9 F (37.2 C) 99 F (37.2 C) 98.2 F (36.8 C)  TempSrc: Oral  Oral   SpO2: 97% 95% 100% 100%  Weight:      Height:        Intake/Output Summary (Last 24 hours) at 09/22/2021 1943 Last data filed at 09/22/2021 1822 Gross per 24 hour  Intake 720 ml  Output 200 ml  Net 520 ml   Filed Weights   09/20/21 1741  Weight: 77.1 kg    Physical  Exam  Gen:- Awake Alert, no acute distress HEENT:- [place].AT, No sclera icterus Neck-Supple Neck,No JVD,.  Lungs-  CTAB , fair symmetrical air movement CV- S1, S2 normal, regular  Abd-  +ve B.Sounds, Abd Soft, No tenderness,    Extremity/Skin:-  pedal pulses present , dressings and Ace wraps to bilateral lower extremities Psych-affect is appropriate, oriented x3 Neuro-generalized weakness, no new focal deficits, no tremors, point tenderness over left hip area  Data Reviewed: I have personally reviewed following labs and imaging studies  CBC: Recent Labs  Lab 09/20/21 1834 09/21/21 0622  WBC 19.0* 12.9*  NEUTROABS 17.6*  --   HGB 13.5 12.5  HCT 41.6 39.5  MCV 96.7 96.8  PLT 204 563   Basic Metabolic Panel: Recent Labs  Lab 09/20/21 1834 09/21/21 0622 09/22/21 0630  NA 140 142 139  K 3.4* 3.4* 3.6  CL 101 108 110  CO2 28 30 26   GLUCOSE 140* 116* 101*  BUN 36* 25* 19  CREATININE 1.16* 0.79 0.69  CALCIUM 9.0 8.2* 8.0*  MG  --  1.9  --   PHOS  --  1.9* 1.9*   GFR: Estimated Creatinine Clearance: 51.9 mL/min (by C-G formula based on SCr of 0.69 mg/dL). Liver Function Tests: Recent Labs  Lab 09/21/21 0622 09/22/21 0630  AST 156*  --   ALT 60*  --   ALKPHOS 47  --   BILITOT 1.1  --   PROT 5.4*  --   ALBUMIN 2.8* 2.6*   Cardiac Enzymes: Recent Labs  Lab 09/20/21 1834 09/21/21 0622 09/22/21 0630  CKTOTAL 8,060* 5,285* 2,044*   BNP (last 3 results) No results for input(s): "PROBNP" in the last 8760 hours. HbA1C: No results for input(s): "HGBA1C" in the last 72 hours. Sepsis Labs: @LABRCNTIP (procalcitonin:4,lacticidven:4) ) Recent Results (from the past 240 hour(s))  SARS Coronavirus 2 by RT PCR (hospital order, performed in [facility] hospital lab) *cepheid single result test* Anterior Nasal Swab     Status: None   Collection Time: 09/20/21  6:34 PM   Specimen: Anterior Nasal Swab  Result Value Ref Range Status   SARS Coronavirus 2 by RT PCR NEGATIVE  NEGATIVE Final    Comment: (NOTE) SARS-CoV-2 target nucleic acids are NOT DETECTED.  The SARS-CoV-2 RNA is generally detectable in upper and lower respiratory specimens during the acute phase of infection. The lowest concentration of SARS-CoV-2 viral copies this assay can detect is 250 copies / mL. A negative result does not preclude SARS-CoV-2 infection and should not be used as the sole basis for treatment or other patient management decisions.  A negative result may occur with improper specimen collection / handling, submission of specimen other than nasopharyngeal swab, presence of viral mutation(s) within the areas targeted  by this assay, and inadequate number of viral copies (<250 copies / mL). A negative result must be combined with clinical observations, patient history, and epidemiological information.  Fact Sheet for Patients:   https://www.patel.info/  Fact Sheet for Healthcare Providers: https://hall.com/  This test is not yet approved or  cleared by the [place] FDA and has been authorized for detection and/or diagnosis of SARS-CoV-2 by FDA under an Emergency Use Authorization (EUA).  This EUA will remain in effect (meaning this test can be used) for the duration of the COVID-19 declaration under Section 564(b)(1) of the Act, 21 U.S.C. section 360bbb-3(b)(1), unless the authorization is terminated or revoked sooner.  Performed at [facility], [address], [place], [place] [address]       Radiology Studies: ECHOCARDIOGRAM COMPLETE  Result Date: 09/22/2021    ECHOCARDIOGRAM REPORT   Patient Name:   [name] [name] Date of Exam: 09/22/2021 Medical Rec #:  [id number]    Height:       66.0 in Accession #:    [id number]   Weight:       170.0 lb Date of Birth:  Jun 09, 1932     BSA:          1.866 m Patient Age:    81 years     BP:           158/80 mmHg Patient Gender: F            HR:           82 bpm. Exam Location:  [facility]  Procedure: 2D Echo, Cardiac Doppler and Color Doppler Indications:    Abnormal ECG  History:        Patient has no prior history of Echocardiogram examinations.                 Signs/Symptoms:Edema; Risk Factors:Hypertension and                 Dyslipidemia.  Sonographer:    Wenda Low Referring Phys: [place]  1. Left ventricular ejection fraction, by estimation, is 55%. The left ventricle has normal function. The left ventricle has no regional wall motion abnormalities. Left ventricular diastolic parameters are consistent with Grade I diastolic dysfunction (impaired relaxation).  2. Right ventricular systolic function is normal. The right ventricular size is normal. There is normal pulmonary artery systolic pressure.  3. Left atrial size was mildly dilated.  4. The mitral valve is abnormal. Trivial mitral valve regurgitation. No evidence of mitral stenosis.  5. Calcified mild stenosis by gradients but DVI/AVA more moderate . The aortic valve is tricuspid. There is moderate calcification of the aortic valve. There is moderate thickening of the aortic valve. Aortic valve regurgitation is not visualized. Moderate aortic valve stenosis.  6. The inferior vena cava is normal in size with greater than 50% respiratory variability, suggesting right atrial pressure of 3 mmHg. FINDINGS  Left Ventricle: Left ventricular ejection fraction, by estimation, is 55%. The left ventricle has normal function. The left ventricle has no regional wall motion abnormalities. The left ventricular internal cavity size was normal in size. There is no left ventricular hypertrophy. Left ventricular diastolic parameters are consistent with Grade I diastolic dysfunction (impaired relaxation). Right Ventricle: The right ventricular size is normal. No increase in right ventricular wall thickness. Right ventricular systolic function is normal. There is normal pulmonary artery systolic pressure. The tricuspid  regurgitant velocity is 2.44 m/s, and  with an assumed right atrial pressure of  3 mmHg, the estimated right ventricular systolic pressure is 48.0 mmHg. Left Atrium: Left atrial size was mildly dilated. Right Atrium: Right atrial size was normal in size. Pericardium: There is no evidence of pericardial effusion. Mitral Valve: The mitral valve is abnormal. There is mild thickening of the mitral valve leaflet(s). Trivial mitral valve regurgitation. No evidence of mitral valve stenosis. MV peak gradient, 4.8 mmHg. The mean mitral valve gradient is 2.0 mmHg. Tricuspid Valve: The tricuspid valve is normal in structure. Tricuspid valve regurgitation is not demonstrated. No evidence of tricuspid stenosis. Aortic Valve: Calcified mild stenosis by gradients but DVI/AVA more moderate. The aortic valve is tricuspid. There is moderate calcification of the aortic valve. There is moderate thickening of the aortic valve. Aortic valve regurgitation is not visualized. Moderate aortic stenosis is present. Aortic valve mean gradient measures 8.5 mmHg. Aortic valve peak gradient measures 14.5 mmHg. Aortic valve area, by VTI measures 1.05 cm. Pulmonic Valve: The pulmonic valve was normal in structure. Pulmonic valve regurgitation is not visualized. No evidence of pulmonic stenosis. Aorta: The aortic root is normal in size and structure. Venous: The inferior vena cava is normal in size with greater than 50% respiratory variability, suggesting right atrial pressure of 3 mmHg. IAS/Shunts: No atrial level shunt detected by color flow Doppler.  LEFT VENTRICLE PLAX 2D LVIDd:         3.90 cm     Diastology LVIDs:         2.90 cm     LV e' medial:    5.66 cm/s LV PW:         0.90 cm     LV E/e' medial:  12.8 LV IVS:        1.10 cm     LV e' lateral:   7.51 cm/s LVOT diam:     1.80 cm     LV E/e' lateral: 9.6 LV SV:         43 LV SV Index:   23 LVOT Area:     2.54 cm  LV Volumes (MOD) LV vol d, MOD A2C: 66.6 ml LV vol d, MOD A4C: 36.7 ml LV vol  s, MOD A2C: 29.2 ml LV vol s, MOD A4C: 15.5 ml LV SV MOD A2C:     37.4 ml LV SV MOD A4C:     36.7 ml LV SV MOD BP:      28.8 ml RIGHT VENTRICLE RV Basal diam:  3.00 cm RV Mid diam:    2.90 cm RV S prime:     12.20 cm/s LEFT ATRIUM             Index        RIGHT ATRIUM           Index LA diam:        3.80 cm 2.04 cm/m   RA Area:     12.40 cm LA Vol (A2C):   73.2 ml 39.22 ml/m  RA Volume:   23.40 ml  12.54 ml/m LA Vol (A4C):   50.8 ml 27.22 ml/m LA Biplane Vol: 61.8 ml 33.11 ml/m  AORTIC VALVE                     PULMONIC VALVE AV Area (Vmax):    1.01 cm      PV Vmax:       0.72 m/s AV Area (Vmean):   0.91 cm      PV Peak grad:  2.1 mmHg AV Area (  VTI):     1.05 cm AV Vmax:           190.50 cm/s AV Vmean:          137.000 cm/s AV VTI:            0.406 m AV Peak Grad:      14.5 mmHg AV Mean Grad:      8.5 mmHg LVOT Vmax:         75.70 cm/s LVOT Vmean:        48.800 cm/s LVOT VTI:          0.168 m LVOT/AV VTI ratio: 0.41  AORTA Ao Root diam: 3.00 cm MITRAL VALVE                TRICUSPID VALVE MV Area (PHT): 3.27 cm     TR Peak grad:   23.8 mmHg MV Area VTI:   1.54 cm     TR Vmax:        244.00 cm/s MV Peak grad:  4.8 mmHg MV Mean grad:  2.0 mmHg     SHUNTS MV Vmax:       1.09 m/s     Systemic VTI:  0.17 m MV Vmean:      57.6 cm/s    Systemic Diam: 1.80 cm MV Decel Time: 232 msec MV E velocity: 72.40 cm/s MV A velocity: 103.00 cm/s MV E/A ratio:  0.70 Jenkins Rouge MD Electronically signed by Jenkins Rouge MD Signature Date/Time: 09/22/2021/12:59:34 PM    Final    DG HIP UNILAT W OR W/O PELVIS 2-3 VIEWS LEFT  Result Date: 09/22/2021 CLINICAL DATA:  Left hip pain. EXAM: DG HIP (WITH OR WITHOUT PELVIS) 2-3V LEFT COMPARISON:  None Available. FINDINGS: There is no evidence of hip fracture or dislocation. Degenerative changes of the hip joint with marginal spurring. IMPRESSION: No evidence of acute left hip fracture or dislocation. If there is persistent clinical concern and patient is unable to bear weight, further  evaluation with MR examination could be considered. Electronically Signed   By: Keane Police D.O.   On: 09/22/2021 10:40     Scheduled Meds:  apixaban  5 mg Oral BID   cholecalciferol  1,000 Units Oral Daily   ezetimibe  10 mg Oral Daily   feeding supplement (GLUCERNA SHAKE)  237 mL Oral TID BM   gabapentin  200 mg Oral TID   methocarbamol  500 mg Oral TID   nystatin   Topical BID   potassium chloride  40 mEq Oral Once   predniSONE  10 mg Oral Q breakfast   Continuous Infusions:  sodium chloride 75 mL/hr at 09/22/21 1748     LOS: 2 days    Roxan Hockey M.D on 09/22/2021 at 7:43 PM  Go to [email or web address] - for contact info  [facility] - Office  [phone]  If 7PM-7AM, please contact night-coverage [email or web address] 09/22/2021, 7:43 PM

## 2021-09-22 NOTE — Progress Notes (Signed)
Pt assisted to transfer from chair to bed with staff x3 and FWW for echo. Pt tolerated well.

## 2021-09-23 DIAGNOSIS — M6282 Rhabdomyolysis: Secondary | ICD-10-CM | POA: Diagnosis not present

## 2021-09-23 DIAGNOSIS — I10 Essential (primary) hypertension: Secondary | ICD-10-CM | POA: Diagnosis not present

## 2021-09-23 DIAGNOSIS — D72829 Elevated white blood cell count, unspecified: Secondary | ICD-10-CM | POA: Diagnosis not present

## 2021-09-23 LAB — CBC
HCT: 35.7 % — ABNORMAL LOW (ref 36.0–46.0)
Hemoglobin: 10.8 g/dL — ABNORMAL LOW (ref 12.0–15.0)
MCH: 30 pg (ref 26.0–34.0)
MCHC: 30.3 g/dL (ref 30.0–36.0)
MCV: 99.2 fL (ref 80.0–100.0)
Platelets: 149 10*3/uL — ABNORMAL LOW (ref 150–400)
RBC: 3.6 MIL/uL — ABNORMAL LOW (ref 3.87–5.11)
RDW: 14.2 % (ref 11.5–15.5)
WBC: 8.6 10*3/uL (ref 4.0–10.5)
nRBC: 0 % (ref 0.0–0.2)

## 2021-09-23 LAB — CK: Total CK: 916 U/L — ABNORMAL HIGH (ref 38–234)

## 2021-09-23 MED ORDER — K PHOS MONO-SOD PHOS DI & MONO 155-852-130 MG PO TABS
250.0000 mg | ORAL_TABLET | Freq: Three times a day (TID) | ORAL | Status: DC
  Administered 2021-09-23 – 2021-09-24 (×3): 250 mg via ORAL
  Filled 2021-09-23 (×3): qty 1

## 2021-09-23 NOTE — Progress Notes (Signed)
PROGRESS NOTE     [name] [name], is a 88 y.o. female, DOB - 04-22-1932, ZHY:[id number]  Admit date - 09/20/2021   Admitting Physician Bernadette Hoit, DO  Outpatient Primary MD for the patient is Fayrene Helper, MD  LOS - 3  Chief Complaint  Patient presents with   Fall        Brief Narrative:   88 y.o. female with medical history significant of left lower extremity DVT, HLD, HTN, Osteoarthritis, recurrent falls, bilateral venous stasis ulcers, lumbar spinal stenosis admitted on 09/20/2021 with another episode of fall and concern for rhabdomyolysis -Patient is medically stable for discharge at this time Awaiting transfer to SNF rehab ending insurance authorization   -Assessment and Plan:  1)Rhabdomyolysis--- CKs peaked at 8060, continues to trend down (CK is now 916) -Continue gentle hydration  2)Recurrent Falls--myalgias -Denies syncope -CT head without acute findings -Hip and pelvic x-rays without fractures -PT eval appreciated recommends SNF rehab -Left hip x-rays without acute fractures  3)Hypokalemia/hypophosphatemia --- replace and recheck  4)Leukocytosis--suspect reactive due to trauma/falls,  -WBC has normalized -No evidence of acute infection at this time  5)HFpEF/chronic diastolic dysfunction CHF elevated Troponin--- Troponin 241 >>>208 most likely secondary to #1 above, troponin levels not concerning for ACS given levels of CKs -EKG sinus rhythm with LVH and PACs -Echo with EF of 55%, no wall motion abnormalities, grade 1 diastolic dysfunction noted -Appears compensated, no acute exacerbation of CHF -Taper off IV fluids to avoid volume overload  6) history of DVT--- continue Eliquis  7)Bilateral venous stasis without frank ulcers--Bilateral leg wrapped (dry boots -kerlix and coban-) as recommended by wound care specialist Prior to admission patient had Home health nurse doing a dressing changes twice a week at home  8)Osteoarthritis/rheumatoid  arthritis Continue Tylenol as needed Patient takes methotrexate weekly Continue prednisone per home regimen -Continue methocarbamol due to falls and stiffness  9)HTN BP stable, continue to hold Maxzide  Disposition/Need for in-Hospital Stay- patient unable to be discharged at this time due to --awaiting insurance approval for transfer to SNF rehab   Status is: Inpatient   Disposition: The patient is from: Home              Anticipated d/c is to:  SNF rehab discharge               Anticipated d/c date is: 1 day              Patient currently is medically stable to d/c. Barriers: Awaiting transfer to SNF rehab ending insurance authorization  Code Status :  -  Code Status: Full Code   Family Communication: No family at bedside  DVT Prophylaxis  :   - SCDs   SCDs Start: 09/20/21 2222 apixaban (ELIQUIS) tablet 5 mg   Lab Results  Component Value Date   PLT 149 (L) 09/23/2021    Inpatient Medications  Scheduled Meds:  apixaban  5 mg Oral BID   cholecalciferol  1,000 Units Oral Daily   ezetimibe  10 mg Oral Daily   feeding supplement (GLUCERNA SHAKE)  237 mL Oral TID BM   gabapentin  200 mg Oral TID   methocarbamol  500 mg Oral TID   nystatin   Topical BID   phosphorus  250 mg Oral TID   predniSONE  10 mg Oral Q breakfast   Continuous Infusions:  sodium chloride 75 mL/hr at 09/23/21 0801   PRN Meds:.acetaminophen **OR** acetaminophen, oxyCODONE   Anti-infectives (From admission, onward)  None        Subjective: [name] [name] today has no fevers, no emesis,  No chest pain,   - No new concerns, resting comfortably eating and drinking well  Objective: Vitals:   09/21/21 2135 09/22/21 1416 09/22/21 2118 09/23/21 0450  BP: (!) 158/80 119/73 (!) 157/68 (!) 169/79  Pulse: 91 92 87 86  Resp: 14 16 18 19   Temp: 99 F (37.2 C) 98.2 F (36.8 C) 98 F (36.7 C) 98.7 F (37.1 C)  TempSrc: Oral  Oral   SpO2: 100% 100% 100% 99%  Weight:      Height:         Intake/Output Summary (Last 24 hours) at 09/23/2021 1303 Last data filed at 09/23/2021 0517 Gross per 24 hour  Intake 240 ml  Output 1500 ml  Net -1260 ml   Filed Weights   09/20/21 1741  Weight: 77.1 kg    Physical Exam  Gen:- Awake Alert, no acute distress HEENT:- [place].AT, No sclera icterus Neck-Supple Neck,No JVD,.  Lungs-  CTAB , fair symmetrical air movement CV- S1, S2 normal, regular  Abd-  +ve B.Sounds, Abd Soft, No tenderness,    Extremity/Skin:-  pedal pulses present , dressings/ wraps to bilateral lower extremities Psych-affect is appropriate, oriented x3 Neuro-generalized weakness, no new focal deficits, no tremors, point tenderness over left hip area  Data Reviewed: I have personally reviewed following labs and imaging studies  CBC: Recent Labs  Lab 09/20/21 1834 09/21/21 0622 09/23/21 0528  WBC 19.0* 12.9* 8.6  NEUTROABS 17.6*  --   --   HGB 13.5 12.5 10.8*  HCT 41.6 39.5 35.7*  MCV 96.7 96.8 99.2  PLT 204 170 503*   Basic Metabolic Panel: Recent Labs  Lab 09/20/21 1834 09/21/21 0622 09/22/21 0630  NA 140 142 139  K 3.4* 3.4* 3.6  CL 101 108 110  CO2 28 30 26   GLUCOSE 140* 116* 101*  BUN 36* 25* 19  CREATININE 1.16* 0.79 0.69  CALCIUM 9.0 8.2* 8.0*  MG  --  1.9  --   PHOS  --  1.9* 1.9*   GFR: Estimated Creatinine Clearance: 51.9 mL/min (by C-G formula based on SCr of 0.69 mg/dL). Liver Function Tests: Recent Labs  Lab 09/21/21 0622 09/22/21 0630  AST 156*  --   ALT 60*  --   ALKPHOS 47  --   BILITOT 1.1  --   PROT 5.4*  --   ALBUMIN 2.8* 2.6*   Cardiac Enzymes: Recent Labs  Lab 09/20/21 1834 09/21/21 0622 09/22/21 0630 09/23/21 0528  CKTOTAL 8,060* 5,285* 2,044* 916*   BNP (last 3 results) No results for input(s): "PROBNP" in the last 8760 hours. HbA1C: No results for input(s): "HGBA1C" in the last 72 hours. Sepsis Labs: @LABRCNTIP (procalcitonin:4,lacticidven:4) ) Recent Results (from the past 240 hour(s))  SARS  Coronavirus 2 by RT PCR (hospital order, performed in [facility] hospital lab) *cepheid single result test* Anterior Nasal Swab     Status: None   Collection Time: 09/20/21  6:34 PM   Specimen: Anterior Nasal Swab  Result Value Ref Range Status   SARS Coronavirus 2 by RT PCR NEGATIVE NEGATIVE Final    Comment: (NOTE) SARS-CoV-2 target nucleic acids are NOT DETECTED.  The SARS-CoV-2 RNA is generally detectable in upper and lower respiratory specimens during the acute phase of infection. The lowest concentration of SARS-CoV-2 viral copies this assay can detect is 250 copies / mL. A negative result does not preclude SARS-CoV-2 infection and should  not be used as the sole basis for treatment or other patient management decisions.  A negative result may occur with improper specimen collection / handling, submission of specimen other than nasopharyngeal swab, presence of viral mutation(s) within the areas targeted by this assay, and inadequate number of viral copies (<250 copies / mL). A negative result must be combined with clinical observations, patient history, and epidemiological information.  Fact Sheet for Patients:   https://www.patel.info/  Fact Sheet for Healthcare Providers: https://hall.com/  This test is not yet approved or  cleared by the [place] FDA and has been authorized for detection and/or diagnosis of SARS-CoV-2 by FDA under an Emergency Use Authorization (EUA).  This EUA will remain in effect (meaning this test can be used) for the duration of the COVID-19 declaration under Section 564(b)(1) of the Act, 21 U.S.C. section 360bbb-3(b)(1), unless the authorization is terminated or revoked sooner.  Performed at [facility], [address], [place], [place] [address]       Radiology Studies: ECHOCARDIOGRAM COMPLETE  Result Date: 09/22/2021    ECHOCARDIOGRAM REPORT   Patient Name:   MARGURETE GUAMAN Ditto Date of Exam: 09/22/2021  Medical Rec #:  [id number]    Height:       66.0 in Accession #:    [id number]   Weight:       170.0 lb Date of Birth:  1932/10/11     BSA:          1.866 m Patient Age:    15 years     BP:           158/80 mmHg Patient Gender: F            HR:           82 bpm. Exam Location:  [facility] Procedure: 2D Echo, Cardiac Doppler and Color Doppler Indications:    Abnormal ECG  History:        Patient has no prior history of Echocardiogram examinations.                 Signs/Symptoms:Edema; Risk Factors:Hypertension and                 Dyslipidemia.  Sonographer:    Wenda Low Referring Phys: [place]  1. Left ventricular ejection fraction, by estimation, is 55%. The left ventricle has normal function. The left ventricle has no regional wall motion abnormalities. Left ventricular diastolic parameters are consistent with Grade I diastolic dysfunction (impaired relaxation).  2. Right ventricular systolic function is normal. The right ventricular size is normal. There is normal pulmonary artery systolic pressure.  3. Left atrial size was mildly dilated.  4. The mitral valve is abnormal. Trivial mitral valve regurgitation. No evidence of mitral stenosis.  5. Calcified mild stenosis by gradients but DVI/AVA more moderate . The aortic valve is tricuspid. There is moderate calcification of the aortic valve. There is moderate thickening of the aortic valve. Aortic valve regurgitation is not visualized. Moderate aortic valve stenosis.  6. The inferior vena cava is normal in size with greater than 50% respiratory variability, suggesting right atrial pressure of 3 mmHg. FINDINGS  Left Ventricle: Left ventricular ejection fraction, by estimation, is 55%. The left ventricle has normal function. The left ventricle has no regional wall motion abnormalities. The left ventricular internal cavity size was normal in size. There is no left ventricular hypertrophy. Left ventricular diastolic parameters are  consistent with Grade I diastolic dysfunction (impaired relaxation). Right Ventricle: The  right ventricular size is normal. No increase in right ventricular wall thickness. Right ventricular systolic function is normal. There is normal pulmonary artery systolic pressure. The tricuspid regurgitant velocity is 2.44 m/s, and  with an assumed right atrial pressure of 3 mmHg, the estimated right ventricular systolic pressure is 35.7 mmHg. Left Atrium: Left atrial size was mildly dilated. Right Atrium: Right atrial size was normal in size. Pericardium: There is no evidence of pericardial effusion. Mitral Valve: The mitral valve is abnormal. There is mild thickening of the mitral valve leaflet(s). Trivial mitral valve regurgitation. No evidence of mitral valve stenosis. MV peak gradient, 4.8 mmHg. The mean mitral valve gradient is 2.0 mmHg. Tricuspid Valve: The tricuspid valve is normal in structure. Tricuspid valve regurgitation is not demonstrated. No evidence of tricuspid stenosis. Aortic Valve: Calcified mild stenosis by gradients but DVI/AVA more moderate. The aortic valve is tricuspid. There is moderate calcification of the aortic valve. There is moderate thickening of the aortic valve. Aortic valve regurgitation is not visualized. Moderate aortic stenosis is present. Aortic valve mean gradient measures 8.5 mmHg. Aortic valve peak gradient measures 14.5 mmHg. Aortic valve area, by VTI measures 1.05 cm. Pulmonic Valve: The pulmonic valve was normal in structure. Pulmonic valve regurgitation is not visualized. No evidence of pulmonic stenosis. Aorta: The aortic root is normal in size and structure. Venous: The inferior vena cava is normal in size with greater than 50% respiratory variability, suggesting right atrial pressure of 3 mmHg. IAS/Shunts: No atrial level shunt detected by color flow Doppler.  LEFT VENTRICLE PLAX 2D LVIDd:         3.90 cm     Diastology LVIDs:         2.90 cm     LV e' medial:    5.66 cm/s LV  PW:         0.90 cm     LV E/e' medial:  12.8 LV IVS:        1.10 cm     LV e' lateral:   7.51 cm/s LVOT diam:     1.80 cm     LV E/e' lateral: 9.6 LV SV:         43 LV SV Index:   23 LVOT Area:     2.54 cm  LV Volumes (MOD) LV vol d, MOD A2C: 66.6 ml LV vol d, MOD A4C: 36.7 ml LV vol s, MOD A2C: 29.2 ml LV vol s, MOD A4C: 15.5 ml LV SV MOD A2C:     37.4 ml LV SV MOD A4C:     36.7 ml LV SV MOD BP:      28.8 ml RIGHT VENTRICLE RV Basal diam:  3.00 cm RV Mid diam:    2.90 cm RV S prime:     12.20 cm/s LEFT ATRIUM             Index        RIGHT ATRIUM           Index LA diam:        3.80 cm 2.04 cm/m   RA Area:     12.40 cm LA Vol (A2C):   73.2 ml 39.22 ml/m  RA Volume:   23.40 ml  12.54 ml/m LA Vol (A4C):   50.8 ml 27.22 ml/m LA Biplane Vol: 61.8 ml 33.11 ml/m  AORTIC VALVE                     PULMONIC VALVE AV Area (  Vmax):    1.01 cm      PV Vmax:       0.72 m/s AV Area (Vmean):   0.91 cm      PV Peak grad:  2.1 mmHg AV Area (VTI):     1.05 cm AV Vmax:           190.50 cm/s AV Vmean:          137.000 cm/s AV VTI:            0.406 m AV Peak Grad:      14.5 mmHg AV Mean Grad:      8.5 mmHg LVOT Vmax:         75.70 cm/s LVOT Vmean:        48.800 cm/s LVOT VTI:          0.168 m LVOT/AV VTI ratio: 0.41  AORTA Ao Root diam: 3.00 cm MITRAL VALVE                TRICUSPID VALVE MV Area (PHT): 3.27 cm     TR Peak grad:   23.8 mmHg MV Area VTI:   1.54 cm     TR Vmax:        244.00 cm/s MV Peak grad:  4.8 mmHg MV Mean grad:  2.0 mmHg     SHUNTS MV Vmax:       1.09 m/s     Systemic VTI:  0.17 m MV Vmean:      57.6 cm/s    Systemic Diam: 1.80 cm MV Decel Time: 232 msec MV E velocity: 72.40 cm/s MV A velocity: 103.00 cm/s MV E/A ratio:  0.70 Jenkins Rouge MD Electronically signed by Jenkins Rouge MD Signature Date/Time: 09/22/2021/12:59:34 PM    Final    DG HIP UNILAT W OR W/O PELVIS 2-3 VIEWS LEFT  Result Date: 09/22/2021 CLINICAL DATA:  Left hip pain. EXAM: DG HIP (WITH OR WITHOUT PELVIS) 2-3V LEFT COMPARISON:  None  Available. FINDINGS: There is no evidence of hip fracture or dislocation. Degenerative changes of the hip joint with marginal spurring. IMPRESSION: No evidence of acute left hip fracture or dislocation. If there is persistent clinical concern and patient is unable to bear weight, further evaluation with MR examination could be considered. Electronically Signed   By: Keane Police D.O.   On: 09/22/2021 10:40     Scheduled Meds:  apixaban  5 mg Oral BID   cholecalciferol  1,000 Units Oral Daily   ezetimibe  10 mg Oral Daily   feeding supplement (GLUCERNA SHAKE)  237 mL Oral TID BM   gabapentin  200 mg Oral TID   methocarbamol  500 mg Oral TID   nystatin   Topical BID   phosphorus  250 mg Oral TID   predniSONE  10 mg Oral Q breakfast   Continuous Infusions:  sodium chloride 75 mL/hr at 09/23/21 0801     LOS: 3 days    Roxan Hockey M.D on 09/23/2021 at 1:03 PM  Go to [email or web address] - for contact info  [facility] - Office  [phone]  If 7PM-7AM, please contact night-coverage [email or web address] 09/23/2021, 1:03 PM

## 2021-09-23 NOTE — Consult Note (Signed)
[place] Nurse wound consult note Consultation was completed by review of records, images and assistance from the bedside nurse/clinical staff.  Reason for Consult:Unna's boots Wound type:no open wounds Patient's chart reviewed, she has dry boots (kerlix and coban) being used for edema management and prevention of venous stasis ulcerations and weeping  Pressure Injury POA: NA Measurement:NA Wound bed:NA Drainage (amount, consistency, odor) NA Periwound: NA Dressing procedure/placement/frequency:  Updated orders for dry boots, making sure to place from the toes to the knees to be most therapeutic   Discussed POC with patient and bedside nurse.  Re consult if needed, will not follow at this time. Thanks  Arleigh Dicola [employer], RN,CWOCN, CNS, [place] [phone])

## 2021-09-23 NOTE — TOC Progression Note (Signed)
Transition of Care [facility]) - Progression Note    Patient Details  Name: [name] [name] MRN: [id number] Date of Birth: 16-Aug-1932  [facility] CM/SW Contact  Salome Arnt, [place] Phone Number: 09/23/2021, 11:08 AM  Clinical Narrative: Pt now agreeable to short term rehab. TOC will initiate bed search and authorization.       Expected Discharge Plan: [place] Barriers to Discharge: Continued Medical Work up  Expected Discharge Plan and Services Expected Discharge Plan: [place] In-house Referral: Clinical Social Work   Post Acute Care Choice: Resumption of Svcs/PTA Provider Living arrangements for the past 2 months: Single Family Home                           HH Arranged: RN, PT [facility] Agency: [place] Date [place]: 09/22/21 Time [place]: 1109 Representative spoke with at [place]: Cindie   Social Determinants of Health ([place]) Interventions    Readmission Risk Interventions     No data to display

## 2021-09-23 NOTE — Progress Notes (Signed)
Wraps to bilateral lower legs applied per order of [place] nurse. Patient tolerated well.

## 2021-09-23 NOTE — NC FL2 (Signed)
[place] LEVEL OF CARE SCREENING TOOL     IDENTIFICATION  Patient Name: [name] [name] Birthdate: 03-07-1933 Sex: female Admission Date (Current Location): 09/20/2021  [facility] and [place] Number:  [employer] and Address:  [place] [address] [place]      Provider Number: [id number]  Attending Physician Name and Address:  Roxan Hockey, MD  Relative Name and Phone Number:  Nabria Nevin - Son [phone]    Current Level of Care: Hospital Recommended Level of Care: [place] Prior Approval Number:    Date Approved/Denied:   PASRR Number: [id number]  Discharge Plan: SNF    Current Diagnoses: Patient Active Problem List   Diagnosis Date Noted   Rhabdomyolysis 09/20/2021   Recurrent falls 09/20/2021   Leukocytosis 09/20/2021   Chronic deep vein thrombosis (DVT) of distal vein of left lower extremity (HCC) 09/20/2021   Elevated troponin 09/20/2021   Osteoarthritis 09/20/2021   Paresthesia 05/05/2021   Gait abnormality 05/05/2021   Dermatitis 05/01/2021   Leg ulcer, left ([place]) 03/25/2021   HZV (herpes zoster virus) post herpetic neuralgia 05/31/2020   Zoster 04/30/2020   Hypomagnesemia 08/11/2019   Weakness generalized 08/09/2019   Lower extremity edema 08/06/2019   Bilateral lower extremity edema 08/04/2019   Hypokalemia 08/04/2019   Hypoalbuminemia 08/04/2019   Venous stasis ulcers ([place]) 04/03/2019   Lower leg edema 03/22/2019   At high risk for falls per [name] fall risk assessment scale 02/09/2019   Neck pain, bilateral posterior 01/13/2019   Statins contraindicated 08/14/2018   Peripheral neuropathy 07/06/2018   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   Lumbar spinal stenosis 01/14/2018   GERD (gastroesophageal reflux disease) 06/17/2016   Normocytic anemia    Sciatica of left side associated with disorder of lumbosacral spine 11/29/2014   Chronic constipation 10/04/2012   Urinary  incontinence 06/20/2012   Vitamin D deficiency 08/22/2011   Prediabetes 05/29/2011   NASH (nonalcoholic steatohepatitis) 11/05/2009   Arthritis of right knee 09/04/2009   Hyperlipidemia LDL goal <100 09/08/2007   Essential hypertension 09/08/2007    Orientation RESPIRATION BLADDER Height & Weight     Self, Time, Situation, Place  Normal Continent Weight: 77.1 kg Height:  5' 6"  (167.6 cm)  BEHAVIORAL SYMPTOMS/MOOD NEUROLOGICAL BOWEL NUTRITION STATUS      Continent Diet (See DC summary)  AMBULATORY STATUS COMMUNICATION OF NEEDS Skin   Extensive Assist Verbally Normal                       Personal Care Assistance Level of Assistance  Bathing, Feeding, Dressing Bathing Assistance: Maximum assistance Feeding assistance: Limited assistance Dressing Assistance: Maximum assistance     Functional Limitations Info  Sight, Hearing, Speech Sight Info: Adequate Hearing Info: Adequate Speech Info: Adequate    SPECIAL CARE FACTORS FREQUENCY                       Contractures Contractures Info: Not present    Additional Factors Info  Code Status, Allergies Code Status Info: Full Allergies Info: Cellcept, Cirpo, Hydrocodone, Penicillins, statins           Current Medications (09/23/2021):  This is the current hospital active medication list Current Facility-Administered Medications  Medication Dose Route Frequency Provider Last Rate Last Admin   0.9 %  sodium chloride infusion   Intravenous Continuous Denton Brick, Courage, MD 75 mL/hr at 09/23/21 0801 New Bag at 09/23/21 0801   acetaminophen (TYLENOL) tablet 650  mg  650 mg Oral Q6H PRN Adefeso, Oladapo, DO   650 mg at 09/22/21 0503   Or   acetaminophen (TYLENOL) suppository 650 mg  650 mg Rectal Q6H PRN Adefeso, Oladapo, DO       apixaban (ELIQUIS) tablet 5 mg  5 mg Oral BID Adefeso, Oladapo, DO   5 mg at 09/23/21 1021   cholecalciferol (VITAMIN D3) tablet 1,000 Units  1,000 Units Oral Daily Adefeso, Oladapo, DO   1,000  Units at 09/23/21 1021   ezetimibe (ZETIA) tablet 10 mg  10 mg Oral Daily Adefeso, Oladapo, DO   10 mg at 09/23/21 1020   feeding supplement (GLUCERNA SHAKE) (GLUCERNA SHAKE) liquid 237 mL  237 mL Oral TID BM Adefeso, Oladapo, DO   237 mL at 09/23/21 1021   gabapentin (NEURONTIN) capsule 200 mg  200 mg Oral TID Denton Brick, Courage, MD   200 mg at 09/23/21 1021   methocarbamol (ROBAXIN) tablet 500 mg  500 mg Oral TID Roxan Hockey, MD   500 mg at 09/23/21 1020   nystatin (MYCOSTATIN/NYSTOP) topical powder   Topical BID Roxan Hockey, MD   Given at 09/23/21 1025   oxyCODONE (Oxy IR/ROXICODONE) immediate release tablet 5 mg  5 mg Oral Q6H PRN Emokpae, Courage, MD       predniSONE (DELTASONE) tablet 10 mg  10 mg Oral Q breakfast Adefeso, Oladapo, DO   10 mg at 09/23/21 7619     Discharge Medications: Please see discharge summary for a list of discharge medications.  Relevant Imaging Results:  Relevant Lab Results:   Additional Information SS# [id number]  Boneta Lucks, RN

## 2021-09-23 NOTE — Progress Notes (Signed)
Physical Therapy Treatment Patient Details Name: [name] [name] MRN: [id number] DOB: [birth year] Today's Date: 09/23/2021   History of Present Illness [name] [name] is a 88 y.o. female with medical history significant of left lower extremity DVT, hyperlipidemia, essential hypertension, Osteoarthritis, recurrent falls, bilateral venous stasis ulcers, lumbar spinal stenosis who presents to the emergency department via EMS from home after sustaining a fall.  Patient states that she slid out of bed last night and was unable to get up due to having generalized weakness, she was found by family on the floor today, EMS was activated and patient was taken to the ED for further evaluation and management.  She denies fever, chills, chest pain, shortness of breath, nausea or vomiting.  Patient lives alone and ambulates with a manual wheelchair.  She states that she has home health aide and family members who comes around to help with some chores.  She follows with her PCP for chronic leg swelling and wounds.    PT Comments    Patient lying in bed on therapy arrival and is pleasant and agreeable to physical therapy.  Patient reports increased left leg pain today; unsure of why perhaps from her initial fall at home.  She needs min to moderate assistance with her legs to come from supine to sitting on the edge of the bed and needs minimal upper extremity assistance to bring her trunk fully upright.  Once sitting on the edge of the bed, therapist guides patient in seated lower extremity therapeutic exercises; she states pain in her left leg is increased so we don't attempt standing today. Patient needs min to moderate assistance with her legs with returning to supine in bed and needs mod assistance to scoot up in bed today. Patient will benefit from continued skilled therapy interventions to address remaining deficits and promote return to optimal function during the remainder of her hospital stay and at the next recommended  venue of care.     Recommendations for follow up therapy are one component of a multi-disciplinary discharge planning process, led by the attending physician.  Recommendations may be updated based on patient status, additional functional criteria and insurance authorization.  Follow Up Recommendations  Skilled nursing-short term rehab (<3 hours/day) Can patient physically be transported by private vehicle: Yes   Assistance Recommended at Discharge Set up Supervision/Assistance  Patient can return home with the following A little help with walking and/or transfers;Help with stairs or ramp for entrance;Assistance with cooking/housework;A little help with bathing/dressing/bathroom;A lot of help with walking and/or transfers   Equipment Recommendations  None recommended by PT    Recommendations for Other Services       Precautions / Restrictions Precautions Precautions: Fall Restrictions Weight Bearing Restrictions: No     Mobility  Bed Mobility Overal bed mobility: Needs Assistance Bed Mobility: Supine to Sit, Sit to Supine     Supine to sit: Min assist, Mod assist Sit to supine: Min assist, Mod assist   General bed mobility comments: Pt using gait belt as leg lifter to bring BLE into the bed and shift them over to the middle; patient having some increased pain and soreness in her left leg today Patient Response: Cooperative  Transfers Overall transfer level: Needs assistance                 General transfer comment: patient states she is hurting too badly to attempt to stand today    Ambulation/Gait  Stairs             Wheelchair Mobility    Modified Rankin (Stroke Patients Only)       Balance Overall balance assessment: Needs assistance Sitting-balance support: Bilateral upper extremity supported, Feet supported Sitting balance-Leahy Scale: Good Sitting balance - Comments: seated EOB with UE support                                     Cognition Arousal/Alertness: Awake/alert Behavior During Therapy: WFL for tasks assessed/performed Overall Cognitive Status: Within Functional Limits for tasks assessed                                 General Comments: pleasant and cooperative        Exercises Other Exercises Other Exercises: seated heel/toe raises, long arc quads, marching x 10 each    General Comments        Pertinent Vitals/Pain Pain Assessment Pain Assessment: 0-10 Pain Score: 5  Pain Location: left leg Pain Intervention(s): Limited activity within patient's tolerance, Monitored during session    Home Living                          Prior Function            PT Goals (current goals can now be found in the care plan section) Acute Rehab PT Goals Patient Stated Goal: return home PT Goal Formulation: With patient Time For Goal Achievement: 10/06/21 Potential to Achieve Goals: Good Progress towards PT goals: Progressing toward goals    Frequency    Min 3X/week      PT Plan Current plan remains appropriate    Co-evaluation              AM-PAC PT "6 Clicks" Mobility   Outcome Measure  Help needed turning from your back to your side while in a flat bed without using bedrails?: A Little Help needed moving from lying on your back to sitting on the side of a flat bed without using bedrails?: A Lot Help needed moving to and from a bed to a chair (including a wheelchair)?: A Lot Help needed standing up from a chair using your arms (e.g., wheelchair or bedside chair)?: A Lot Help needed to walk in hospital room?: A Lot Help needed climbing 3-5 steps with a railing? : Total 6 Click Score: 12    End of Session   Activity Tolerance: Patient tolerated treatment well;Patient limited by fatigue;Patient limited by pain Patient left: in bed;with call bell/phone within reach   PT Visit Diagnosis: Unsteadiness on feet (R26.81);Other  abnormalities of gait and mobility (R26.89);Muscle weakness (generalized) (M62.81)     Time: 1206-1220 PT Time Calculation (min) (ACUTE ONLY): 14 min  Charges:  $Therapeutic Exercise: 8-22 mins                     1:35 PM, 09/23/21 Yarlin Breisch Small Henok Heacock MPT [facility] [place] [phone] GH:[phone]

## 2021-09-23 NOTE — Consult Note (Signed)
I have placed a request via Secure Chat to Dr. Denton Brick  requesting photos of the wound areas of concern to be placed in the EMR. I have requested bedside nurse to remove LE wraps for images.  Appears patient has some type of ACE or Coban wraps for edema management to prevent weeping per notes from primary care MD. Will follow up when I can review images and update orders.   [place], [place], [place]

## 2021-09-24 DIAGNOSIS — I700 Atherosclerosis of aorta: Secondary | ICD-10-CM | POA: Diagnosis not present

## 2021-09-24 DIAGNOSIS — T796XXA Traumatic ischemia of muscle, initial encounter: Secondary | ICD-10-CM

## 2021-09-24 DIAGNOSIS — E785 Hyperlipidemia, unspecified: Secondary | ICD-10-CM | POA: Diagnosis not present

## 2021-09-24 DIAGNOSIS — W19XXXA Unspecified fall, initial encounter: Secondary | ICD-10-CM

## 2021-09-24 DIAGNOSIS — D649 Anemia, unspecified: Secondary | ICD-10-CM | POA: Diagnosis not present

## 2021-09-24 DIAGNOSIS — R600 Localized edema: Secondary | ICD-10-CM | POA: Diagnosis not present

## 2021-09-24 DIAGNOSIS — R296 Repeated falls: Secondary | ICD-10-CM | POA: Diagnosis not present

## 2021-09-24 DIAGNOSIS — D72829 Elevated white blood cell count, unspecified: Secondary | ICD-10-CM | POA: Diagnosis not present

## 2021-09-24 DIAGNOSIS — L97825 Non-pressure chronic ulcer of other part of left lower leg with muscle involvement without evidence of necrosis: Secondary | ICD-10-CM

## 2021-09-24 DIAGNOSIS — I872 Venous insufficiency (chronic) (peripheral): Secondary | ICD-10-CM | POA: Diagnosis not present

## 2021-09-24 DIAGNOSIS — K219 Gastro-esophageal reflux disease without esophagitis: Secondary | ICD-10-CM | POA: Diagnosis not present

## 2021-09-24 DIAGNOSIS — I739 Peripheral vascular disease, unspecified: Secondary | ICD-10-CM | POA: Diagnosis not present

## 2021-09-24 DIAGNOSIS — G7249 Other inflammatory and immune myopathies, not elsewhere classified: Secondary | ICD-10-CM | POA: Diagnosis not present

## 2021-09-24 DIAGNOSIS — E559 Vitamin D deficiency, unspecified: Secondary | ICD-10-CM | POA: Diagnosis not present

## 2021-09-24 DIAGNOSIS — I10 Essential (primary) hypertension: Secondary | ICD-10-CM | POA: Diagnosis not present

## 2021-09-24 DIAGNOSIS — L97811 Non-pressure chronic ulcer of other part of right lower leg limited to breakdown of skin: Secondary | ICD-10-CM | POA: Diagnosis not present

## 2021-09-24 DIAGNOSIS — G609 Hereditary and idiopathic neuropathy, unspecified: Secondary | ICD-10-CM | POA: Diagnosis not present

## 2021-09-24 DIAGNOSIS — Z7901 Long term (current) use of anticoagulants: Secondary | ICD-10-CM | POA: Diagnosis not present

## 2021-09-24 DIAGNOSIS — I5032 Chronic diastolic (congestive) heart failure: Secondary | ICD-10-CM | POA: Diagnosis not present

## 2021-09-24 DIAGNOSIS — T796XXD Traumatic ischemia of muscle, subsequent encounter: Secondary | ICD-10-CM | POA: Diagnosis not present

## 2021-09-24 DIAGNOSIS — E43 Unspecified severe protein-calorie malnutrition: Secondary | ICD-10-CM | POA: Diagnosis not present

## 2021-09-24 DIAGNOSIS — I825Z2 Chronic embolism and thrombosis of unspecified deep veins of left distal lower extremity: Secondary | ICD-10-CM | POA: Diagnosis not present

## 2021-09-24 DIAGNOSIS — L97821 Non-pressure chronic ulcer of other part of left lower leg limited to breakdown of skin: Secondary | ICD-10-CM | POA: Diagnosis not present

## 2021-09-24 DIAGNOSIS — Z7952 Long term (current) use of systemic steroids: Secondary | ICD-10-CM | POA: Diagnosis not present

## 2021-09-24 DIAGNOSIS — M1990 Unspecified osteoarthritis, unspecified site: Secondary | ICD-10-CM | POA: Diagnosis not present

## 2021-09-24 DIAGNOSIS — R531 Weakness: Secondary | ICD-10-CM | POA: Diagnosis not present

## 2021-09-24 DIAGNOSIS — K5909 Other constipation: Secondary | ICD-10-CM | POA: Diagnosis not present

## 2021-09-24 DIAGNOSIS — T796XXS Traumatic ischemia of muscle, sequela: Secondary | ICD-10-CM | POA: Diagnosis not present

## 2021-09-24 DIAGNOSIS — M6282 Rhabdomyolysis: Secondary | ICD-10-CM | POA: Diagnosis not present

## 2021-09-24 DIAGNOSIS — E876 Hypokalemia: Secondary | ICD-10-CM | POA: Diagnosis not present

## 2021-09-24 DIAGNOSIS — I110 Hypertensive heart disease with heart failure: Secondary | ICD-10-CM | POA: Diagnosis not present

## 2021-09-24 DIAGNOSIS — Z79899 Other long term (current) drug therapy: Secondary | ICD-10-CM | POA: Diagnosis not present

## 2021-09-24 DIAGNOSIS — M48061 Spinal stenosis, lumbar region without neurogenic claudication: Secondary | ICD-10-CM | POA: Diagnosis not present

## 2021-09-24 DIAGNOSIS — I83009 Varicose veins of unspecified lower extremity with ulcer of unspecified site: Secondary | ICD-10-CM | POA: Diagnosis not present

## 2021-09-24 LAB — CK: Total CK: 391 U/L — ABNORMAL HIGH (ref 38–234)

## 2021-09-24 MED ORDER — FUROSEMIDE 20 MG PO TABS: 20.0000 mg | ORAL_TABLET | Freq: Every day | ORAL | 3 refills | Status: DC

## 2021-09-24 MED ORDER — NYSTATIN 100000 UNIT/GM EX POWD: Freq: Two times a day (BID) | CUTANEOUS | Status: DC

## 2021-09-24 MED ORDER — GLUCERNA SHAKE PO LIQD: 237.0000 mL | Freq: Three times a day (TID) | ORAL | Status: DC

## 2021-09-24 MED ORDER — K PHOS MONO-SOD PHOS DI & MONO 155-852-130 MG PO TABS: 250.0000 mg | ORAL_TABLET | Freq: Two times a day (BID) | ORAL | 0 refills | Status: AC

## 2021-09-24 NOTE — Assessment & Plan Note (Signed)
-   Continue vitamin D repletion.

## 2021-09-24 NOTE — Assessment & Plan Note (Signed)
-   Patient with history of osteoarthritis and rheumatoid arthritis -Continue the use of weekly methotrexate and daily prednisone. -Continue outpatient follow-up with rheumatology service.

## 2021-09-24 NOTE — TOC Transition Note (Addendum)
Transition of Care [facility]) - CM/SW Discharge Note   Patient Details  Name: [name] [name] MRN: [id number] Date of Birth: [birth year]  [facility] CM/SW Contact:  Salome Arnt, LCSW Phone Number: 09/24/2021, 2:27 PM   Clinical Narrative:  Pt d/c today.  SNF authorization received and provided to [facility]. Pt states she is working on calling family and no need for LCSW to call. Pt will transfer with facility staff. Will send d/c summary upon completion. RN given number to call report. Cindie with [facility] notified pt is going to SNF and will not need home health.     Final next level of care: Skilled Nursing Facility Barriers to Discharge: Continued Medical Work up   Patient Goals and CMS Choice Patient states their goals for this hospitalization and ongoing recovery are:: return home   Choice offered to / list presented to : Patient  Discharge Placement              Patient chooses bed at: [facility] Patient to be transferred to facility by: facility staff Name of family member notified: Pt only Patient and family notified of of transfer: 09/24/21  Discharge Plan and Services In-house Referral: Clinical Social Work   Post Acute Care Choice: Resumption of Svcs/PTA Provider                    HH Arranged: RN, PT [place] Agency: [place] Date [place]: 09/22/21 Time [place]: 1109 Representative spoke with at [place]: Cindie  Social Determinants of Health ([place]) Interventions     Readmission Risk Interventions     No data to display

## 2021-09-24 NOTE — Assessment & Plan Note (Signed)
-   In the setting of a stress demargination -No acute infection appreciated -Continue good hydration -Repeat CBC to follow WBCs trend/stability in 1 week.

## 2021-09-24 NOTE — Assessment & Plan Note (Signed)
-   In the setting of diuretics usage -Repleted and within normal limits at discharge -Continue to follow electrolytes trend/stability.

## 2021-09-24 NOTE — Discharge Summary (Signed)
Physician Discharge Summary   Patient: [name] [name] MRN: [id number] DOB: 11/05/1932  Admit date:     09/20/2021  Discharge date: 09/24/21  Discharge Physician: Barton Dubois   PCP: Fayrene Helper, MD   Recommendations at discharge:  Repeat basic metabolic panel in 1 week to follow electrolytes and renal function Repeat magnesium/phosphorus level in 1 week to assess stability and determine further repletion. Daily weights/low-sodium diet or maintaining adequate hydration. Repeat CBC to follow WBCs/hemoglobin trend/stability  Discharge Diagnoses: Principal Problem:   Rhabdomyolysis Active Problems:   Hyperlipidemia LDL goal <100   Essential hypertension   Vitamin D deficiency   Venous stasis ulcers (HCC)   Hypokalemia   Weakness generalized   Recurrent falls   Leukocytosis   Chronic deep vein thrombosis (DVT) of distal vein of left lower extremity (HCC)   Elevated troponin   Osteoarthritis   [facility] Course: As per H&P written by Dr. Josephine Cables on 09/20/2021 DERHONDA [name] is a 88 y.o. female with medical history significant of left lower extremity DVT, hyperlipidemia, essential hypertension, Osteoarthritis, recurrent falls, bilateral venous stasis ulcers, lumbar spinal stenosis who presents to the emergency department via EMS from home after sustaining a fall.  Patient states that she slid out of bed last night and was unable to get up due to having generalized weakness, she was found by family on the floor today, EMS was activated and patient was taken to the ED for further evaluation and management.  She denies fever, chills, chest pain, shortness of breath, nausea or vomiting.  Patient lives alone and ambulates with a manual wheelchair.  She states that she has home health aide and family members who comes around to help with some chores.  She follows with her PCP for chronic leg swelling and wounds.   ED Course:  In the emergency department, she was intermittently  tachypneic, BP was 145/77, other vital signs are within normal range.  Work-up in the ED showed normal CBC except for leukocytosis, BMP showed hypokalemia, BUN/creatinine 36/1.16 (baseline creatinine at 0.7-1.0).  CBG 140, troponin x2 -241 > 208.  Total CK 8,060. SARS coronavirus 2 was negative. Chest x-ray showed no active disease CT head without contrast showed no acute intracranial abnormality.  No skull fracture. IV hydration with 1 L NS and sodium bicarbonate was given.  Hospitalist was asked to admit patient for further evaluation and management.  Assessment and Plan: * Rhabdomyolysis - Traumatic in the setting of falls. -CK level over 8000 at time of admission -Normal renal function -Improved with fluid resuscitation and less than 1000 at discharge -Patient found in need of rehabilitation/conditioning at a skilled nursing facility at time of discharge. -Advised to maintain adequate hydration.  Osteoarthritis - Patient with history of osteoarthritis and rheumatoid arthritis -Continue the use of weekly methotrexate and daily prednisone. -Continue outpatient follow-up with rheumatology service.  Elevated troponin - No chest pain, no acute ischemic changes on telemetry or EKG. -Most likely in the setting of CK elevation from trauma/falls.  Chronic deep vein thrombosis (DVT) of distal vein of left lower extremity (HCC) - Continue the use of Eliquis.  Leukocytosis - In the setting of a stress demargination -No acute infection appreciated -Continue good hydration -Repeat CBC to follow WBCs trend/stability in 1 week.  Recurrent falls - In the setting of osteoarthritis/rheumatoid arthritis -Physical therapy has seen patient with recommendation for rehab/conditioning at a skilled nursing facility.  Weakness generalized - Seen by physical therapy with recommendations for conditioning  and rehab at skilled nursing facility at discharge.  Hypokalemia - In the setting of diuretics  usage -Repleted and within normal limits at discharge -Continue to follow electrolytes trend/stability.  Venous stasis ulcers (HCC) - Continue dry compression dressings and skin care; no open wounds currently.  Vitamin D deficiency - Continue vitamin D repletion.  Essential hypertension - Stable overall -Continue heart healthy diet -Continue adequate hydration patient started on low-dose Lasix.  Hyperlipidemia LDL goal <100 - Makes hyperlipidemia -Continue the use of Zetia -Heart healthy diet discussed with patient.  Chronic diastolic heart failure -Compensated -Continue the use of diuretics, low-sodium diet, daily weights and adequate hydration.  Consultants: None Procedures performed: See below for x-ray reports. Disposition: Skilled nursing facility Diet recommendation: Heart healthy/low-sodium diet.  DISCHARGE MEDICATION: Allergies as of 09/24/2021       Reactions   Cellcept [mycophenolate Mofetil]    Muscle cramps, mouth soreness, difficulty swallowing, bruising   Statins    Dx of in   Ciprofloxacin Hcl Other (See Comments)   Chills, sick, could not tolerate it   Hydrocodone Nausea Only   Penicillins Rash        Medication List     STOP taking these medications    NON FORMULARY   NON FORMULARY   triamterene-hydrochlorothiazide 37.5-25 MG tablet Commonly known as: MAXZIDE-25   trospium 20 MG tablet Commonly known as: SANCTURA       TAKE these medications    acetaminophen 650 MG CR tablet Commonly known as: TYLENOL Take 650 mg by mouth every 6 (six) hours.   cholecalciferol 25 MCG (1000 UNIT) tablet Commonly known as: VITAMIN D3 Take 1 tablet (1,000 Units total) by mouth daily. What changed:  when to take this additional instructions   Eliquis 5 MG Tabs tablet Generic drug: apixaban TAKE 1 TABLET TWICE DAILY   ezetimibe 10 MG tablet Commonly known as: ZETIA TAKE 1 TABLET EVERY DAY   feeding supplement (GLUCERNA SHAKE) Liqd Take 237  mLs by mouth 3 (three) times daily between meals.   folic acid 1 MG tablet Commonly known as: FOLVITE TAKE 1 TABLET EVERY DAY   furosemide 20 MG tablet Commonly known as: Lasix Take 1 tablet (20 mg total) by mouth daily.   gabapentin 100 MG capsule Commonly known as: NEURONTIN TAKE 1 CAPSULE EVERY MORNING AND TAKE 2 CAPSULES AT BEDTIME   hydrocortisone cream 0.5 % Apply spaingly twice daily to rash on right cheek for 5 days, then as needed   magnesium oxide 400 (240 Mg) MG tablet Commonly known as: MAG-OX TAKE 1 TABLET TWICE DAILY   methotrexate 2.5 MG tablet Take 6 tablets (15 mg total) by mouth once a week. Caution:Chemotherapy. Protect from light.   nystatin powder Commonly known as: MYCOSTATIN/NYSTOP Apply topically 2 (two) times daily.   phosphorus 155-852-130 MG tablet Commonly known as: K PHOS NEUTRAL Take 1 tablet (250 mg total) by mouth 2 (two) times daily for 3 days.   predniSONE 5 MG tablet Commonly known as: DELTASONE Take 2 tablets (10 mg total) by mouth daily with breakfast.   solifenacin 10 MG tablet Commonly known as: VESICARE Take 1 tablet (10 mg total) by mouth daily.        Contact information for follow-up providers     [facility] Follow up.   Specialty: Home Health Services Why: Will contact you to schedule home health visits. Contact information: [place] [address] [phone]  Contact information for after-discharge care     [facility] .   Service: Skilled Nursing Contact information: [address] [place] [phone]                    Discharge Exam: Danley Danker Weights   09/20/21 1741  Weight: 77.1 kg   General exam: Alert, awake, oriented x 3; no chest pain, no nausea, no vomiting, no shortness of breath.  No overnight events.  Feeling ready for rehabilitation. Respiratory system:  Clear to auscultation. Respiratory effort normal.  Good saturation on room air. Cardiovascular system:RRR. No rubs or gallops, no JVD. Gastrointestinal system: Abdomen is nondistended, soft and nontender. No organomegaly or masses felt. Normal bowel sounds heard. Central nervous system: Alert and oriented. No focal neurological deficits. Extremities: No cyanosis or clubbing. Skin: No petechiae. Psychiatry: Judgement and insight appear normal. Mood & affect appropriate.    Condition at discharge: Stable and improved.  The results of significant diagnostics from this hospitalization (including imaging, microbiology, ancillary and laboratory) are listed below for reference.   Imaging Studies: ECHOCARDIOGRAM COMPLETE  Result Date: 09/22/2021    ECHOCARDIOGRAM REPORT   Patient Name:   CHELSEY REDONDO Noviello Date of Exam: 09/22/2021 Medical Rec #:  [id number]    Height:       66.0 in Accession #:    [id number]   Weight:       170.0 lb Date of Birth:  April 05, 1932     BSA:          1.866 m Patient Age:    15 years     BP:           158/80 mmHg Patient Gender: F            HR:           82 bpm. Exam Location:  [facility] Procedure: 2D Echo, Cardiac Doppler and Color Doppler Indications:    Abnormal ECG  History:        Patient has no prior history of Echocardiogram examinations.                 Signs/Symptoms:Edema; Risk Factors:Hypertension and                 Dyslipidemia.  Sonographer:    Wenda Low Referring Phys: [place]  1. Left ventricular ejection fraction, by estimation, is 55%. The left ventricle has normal function. The left ventricle has no regional wall motion abnormalities. Left ventricular diastolic parameters are consistent with Grade I diastolic dysfunction (impaired relaxation).  2. Right ventricular systolic function is normal. The right ventricular size is normal. There is normal pulmonary artery systolic pressure.  3. Left atrial size was mildly dilated.  4. The mitral  valve is abnormal. Trivial mitral valve regurgitation. No evidence of mitral stenosis.  5. Calcified mild stenosis by gradients but DVI/AVA more moderate . The aortic valve is tricuspid. There is moderate calcification of the aortic valve. There is moderate thickening of the aortic valve. Aortic valve regurgitation is not visualized. Moderate aortic valve stenosis.  6. The inferior vena cava is normal in size with greater than 50% respiratory variability, suggesting right atrial pressure of 3 mmHg. FINDINGS  Left Ventricle: Left ventricular ejection fraction, by estimation, is 55%. The left ventricle has normal function. The left ventricle has no regional wall motion abnormalities. The left ventricular internal cavity size was normal in size.  There is no left ventricular hypertrophy. Left ventricular diastolic parameters are consistent with Grade I diastolic dysfunction (impaired relaxation). Right Ventricle: The right ventricular size is normal. No increase in right ventricular wall thickness. Right ventricular systolic function is normal. There is normal pulmonary artery systolic pressure. The tricuspid regurgitant velocity is 2.44 m/s, and  with an assumed right atrial pressure of 3 mmHg, the estimated right ventricular systolic pressure is 80.9 mmHg. Left Atrium: Left atrial size was mildly dilated. Right Atrium: Right atrial size was normal in size. Pericardium: There is no evidence of pericardial effusion. Mitral Valve: The mitral valve is abnormal. There is mild thickening of the mitral valve leaflet(s). Trivial mitral valve regurgitation. No evidence of mitral valve stenosis. MV peak gradient, 4.8 mmHg. The mean mitral valve gradient is 2.0 mmHg. Tricuspid Valve: The tricuspid valve is normal in structure. Tricuspid valve regurgitation is not demonstrated. No evidence of tricuspid stenosis. Aortic Valve: Calcified mild stenosis by gradients but DVI/AVA more moderate. The aortic valve is tricuspid. There is  moderate calcification of the aortic valve. There is moderate thickening of the aortic valve. Aortic valve regurgitation is not visualized. Moderate aortic stenosis is present. Aortic valve mean gradient measures 8.5 mmHg. Aortic valve peak gradient measures 14.5 mmHg. Aortic valve area, by VTI measures 1.05 cm. Pulmonic Valve: The pulmonic valve was normal in structure. Pulmonic valve regurgitation is not visualized. No evidence of pulmonic stenosis. Aorta: The aortic root is normal in size and structure. Venous: The inferior vena cava is normal in size with greater than 50% respiratory variability, suggesting right atrial pressure of 3 mmHg. IAS/Shunts: No atrial level shunt detected by color flow Doppler.  LEFT VENTRICLE PLAX 2D LVIDd:         3.90 cm     Diastology LVIDs:         2.90 cm     LV e' medial:    5.66 cm/s LV PW:         0.90 cm     LV E/e' medial:  12.8 LV IVS:        1.10 cm     LV e' lateral:   7.51 cm/s LVOT diam:     1.80 cm     LV E/e' lateral: 9.6 LV SV:         43 LV SV Index:   23 LVOT Area:     2.54 cm  LV Volumes (MOD) LV vol d, MOD A2C: 66.6 ml LV vol d, MOD A4C: 36.7 ml LV vol s, MOD A2C: 29.2 ml LV vol s, MOD A4C: 15.5 ml LV SV MOD A2C:     37.4 ml LV SV MOD A4C:     36.7 ml LV SV MOD BP:      28.8 ml RIGHT VENTRICLE RV Basal diam:  3.00 cm RV Mid diam:    2.90 cm RV S prime:     12.20 cm/s LEFT ATRIUM             Index        RIGHT ATRIUM           Index LA diam:        3.80 cm 2.04 cm/m   RA Area:     12.40 cm LA Vol (A2C):   73.2 ml 39.22 ml/m  RA Volume:   23.40 ml  12.54 ml/m LA Vol (A4C):   50.8 ml 27.22 ml/m LA Biplane Vol: 61.8 ml 33.11 ml/m  AORTIC VALVE  PULMONIC VALVE AV Area (Vmax):    1.01 cm      PV Vmax:       0.72 m/s AV Area (Vmean):   0.91 cm      PV Peak grad:  2.1 mmHg AV Area (VTI):     1.05 cm AV Vmax:           190.50 cm/s AV Vmean:          137.000 cm/s AV VTI:            0.406 m AV Peak Grad:      14.5 mmHg AV Mean Grad:      8.5  mmHg LVOT Vmax:         75.70 cm/s LVOT Vmean:        48.800 cm/s LVOT VTI:          0.168 m LVOT/AV VTI ratio: 0.41  AORTA Ao Root diam: 3.00 cm MITRAL VALVE                TRICUSPID VALVE MV Area (PHT): 3.27 cm     TR Peak grad:   23.8 mmHg MV Area VTI:   1.54 cm     TR Vmax:        244.00 cm/s MV Peak grad:  4.8 mmHg MV Mean grad:  2.0 mmHg     SHUNTS MV Vmax:       1.09 m/s     Systemic VTI:  0.17 m MV Vmean:      57.6 cm/s    Systemic Diam: 1.80 cm MV Decel Time: 232 msec MV E velocity: 72.40 cm/s MV A velocity: 103.00 cm/s MV E/A ratio:  0.70 Jenkins Rouge MD Electronically signed by Jenkins Rouge MD Signature Date/Time: 09/22/2021/12:59:34 PM    Final    DG HIP UNILAT W OR W/O PELVIS 2-3 VIEWS LEFT  Result Date: 09/22/2021 CLINICAL DATA:  Left hip pain. EXAM: DG HIP (WITH OR WITHOUT PELVIS) 2-3V LEFT COMPARISON:  None Available. FINDINGS: There is no evidence of hip fracture or dislocation. Degenerative changes of the hip joint with marginal spurring. IMPRESSION: No evidence of acute left hip fracture or dislocation. If there is persistent clinical concern and patient is unable to bear weight, further evaluation with MR examination could be considered. Electronically Signed   By: Keane Police D.O.   On: 09/22/2021 10:40   DG Chest Port 1 View  Result Date: 09/20/2021 CLINICAL DATA:  Pt BIB [facility] they state last night pt "slid out of the bed" and was in the floor all night. Pt denies head injury or LOC. Pt denies chest pain and SOBPt c/o increased generalized weakness EXAM: PORTABLE CHEST 1 VIEW COMPARISON:  08/04/2019. FINDINGS: Cardiac silhouette normal in size.  No mediastinal or hilar masses. Clear lungs.  No pleural effusion or pneumothorax. Skeletal structures are grossly intact. IMPRESSION: No active disease. Electronically Signed   By: Lajean Manes M.D.   On: 09/20/2021 18:23   CT Head Wo Contrast  Result Date: 09/20/2021 CLINICAL DATA:  Head trauma, minor (Age >= 65y) Slid out of bed.   Generalized weakness EXAM: CT HEAD WITHOUT CONTRAST TECHNIQUE: Contiguous axial images were obtained from the base of the skull through the vertex without intravenous contrast. RADIATION DOSE REDUCTION: This exam was performed according to the departmental dose-optimization program which includes automated exposure control, adjustment of the mA and/or kV according to patient size and/or use of iterative reconstruction technique. COMPARISON:  Remote head CT 10/30/2003 FINDINGS:  Brain: No intracranial hemorrhage, mass effect, or midline shift. Brain volume is normal for age. No hydrocephalus. The basilar cisterns are patent. Mild periventricular chronic small vessel ischemia, mild for age. No evidence of territorial infarct or acute ischemia. Partially empty sella, typically incidental. No extra-axial or intracranial fluid collection. Vascular: Atherosclerosis of skullbase vasculature without hyperdense vessel or abnormal calcification. Skull: No fracture or focal lesion. Sinuses/Orbits: No acute finding. Other: None. IMPRESSION: 1. No acute intracranial abnormality. No skull fracture. 2. Mild chronic small vessel ischemia. Electronically Signed   By: Keith Rake M.D.   On: 09/20/2021 18:23    Microbiology: Results for orders placed or performed during the hospital encounter of 09/20/21  SARS Coronavirus 2 by RT PCR (hospital order, performed in [facility] hospital lab) *cepheid single result test* Anterior Nasal Swab     Status: None   Collection Time: 09/20/21  6:34 PM   Specimen: Anterior Nasal Swab  Result Value Ref Range Status   SARS Coronavirus 2 by RT PCR NEGATIVE NEGATIVE Final    Comment: (NOTE) SARS-CoV-2 target nucleic acids are NOT DETECTED.  The SARS-CoV-2 RNA is generally detectable in upper and lower respiratory specimens during the acute phase of infection. The lowest concentration of SARS-CoV-2 viral copies this assay can detect is 250 copies / mL. A negative result does not  preclude SARS-CoV-2 infection and should not be used as the sole basis for treatment or other patient management decisions.  A negative result may occur with improper specimen collection / handling, submission of specimen other than nasopharyngeal swab, presence of viral mutation(s) within the areas targeted by this assay, and inadequate number of viral copies (<250 copies / mL). A negative result must be combined with clinical observations, patient history, and epidemiological information.  Fact Sheet for Patients:   https://www.patel.info/  Fact Sheet for Healthcare Providers: https://hall.com/  This test is not yet approved or  cleared by the [place] FDA and has been authorized for detection and/or diagnosis of SARS-CoV-2 by FDA under an Emergency Use Authorization (EUA).  This EUA will remain in effect (meaning this test can be used) for the duration of the COVID-19 declaration under Section 564(b)(1) of the Act, 21 U.S.C. section 360bbb-3(b)(1), unless the authorization is terminated or revoked sooner.  Performed at [facility], [address], [place], [place] [address]     Labs: CBC: Recent Labs  Lab 09/20/21 1834 09/21/21 0622 09/23/21 0528  WBC 19.0* 12.9* 8.6  NEUTROABS 17.6*  --   --   HGB 13.5 12.5 10.8*  HCT 41.6 39.5 35.7*  MCV 96.7 96.8 99.2  PLT 204 170 734*   Basic Metabolic Panel: Recent Labs  Lab 09/20/21 1834 09/21/21 0622 09/22/21 0630  NA 140 142 139  K 3.4* 3.4* 3.6  CL 101 108 110  CO2 28 30 26   GLUCOSE 140* 116* 101*  BUN 36* 25* 19  CREATININE 1.16* 0.79 0.69  CALCIUM 9.0 8.2* 8.0*  MG  --  1.9  --   PHOS  --  1.9* 1.9*   Liver Function Tests: Recent Labs  Lab 09/21/21 0622 09/22/21 0630  AST 156*  --   ALT 60*  --   ALKPHOS 47  --   BILITOT 1.1  --   PROT 5.4*  --   ALBUMIN 2.8* 2.6*   CBG: No results for input(s): "GLUCAP" in the last 168 hours.  Discharge time spent:  greater than 30 minutes.  Signed: Barton Dubois, MD [facility] 09/24/2021

## 2021-09-24 NOTE — Care Management Important Message (Signed)
Important Message  Patient Details  Name: [name] [name] MRN: [id number] Date of Birth: [birth year]   Medicare Important Message Given:  N/A - LOS <3 / Initial given by admissions     Tommy Medal 09/24/2021, 10:09 AM

## 2021-09-24 NOTE — Assessment & Plan Note (Signed)
-   In the setting of osteoarthritis/rheumatoid arthritis -Physical therapy has seen patient with recommendation for rehab/conditioning at a skilled nursing facility.

## 2021-09-24 NOTE — Progress Notes (Signed)
Physical Therapy Treatment Patient Details Name: [name] [name] MRN: [id number] DOB: 1932-08-13 Today's Date: 09/24/2021   History of Present Illness [name] [name] is a 88 y.o. female with medical history significant of left lower extremity DVT, hyperlipidemia, essential hypertension, Osteoarthritis, recurrent falls, bilateral venous stasis ulcers, lumbar spinal stenosis who presents to the emergency department via EMS from home after sustaining a fall.  Patient states that she slid out of bed last night and was unable to get up due to having generalized weakness, she was found by family on the floor today, EMS was activated and patient was taken to the ED for further evaluation and management.  She denies fever, chills, chest pain, shortness of breath, nausea or vomiting.  Patient lives alone and ambulates with a manual wheelchair.  She states that she has home health aide and family members who comes around to help with some chores.  She follows with her PCP for chronic leg swelling and wounds.    PT Comments    Patient presents supine in bed and consents to PT treatment session. Patient is min/mod assist with bed mobility requiring extended time to complete task. Difficulty moving B LE due to mild discomfort and generalized weakness. Patient able to roll onto side but cannot push up from elbow. Trunk stabilization and HHA provided by PT to help reach sitting position. Improved trunk control with ability to scoot to EOB. Patient tolerated therapeutic exercises to assist with LE strengthening. Patient is mod assist with transfers and use of RW. Has difficulty initiating sit to stand along with inability to reach B knee extension to reach upright position. Requires trunk stability by PT once standing but quickly fatigues. Difficulty with coordination along with moderate balance deficits. Limited to few steps during ambulation with mod assist and use of RW. Gait pattern impacted with difficulty in weight  shifting. Patient tolerated sitting up in chair after therapy with nursing staff notified of mobility status. Patient will benefit from continued skilled physical therapy in hospital and recommended venue below to increase strength, balance, endurance for safe ADLs and gait.    Recommendations for follow up therapy are one component of a multi-disciplinary discharge planning process, led by the attending physician.  Recommendations may be updated based on patient status, additional functional criteria and insurance authorization.  Follow Up Recommendations  Skilled nursing-short term rehab (<3 hours/day) Can patient physically be transported by private vehicle: Yes   Assistance Recommended at Discharge Set up Supervision/Assistance  Patient can return home with the following A little help with walking and/or transfers;Help with stairs or ramp for entrance;Assistance with cooking/housework;A little help with bathing/dressing/bathroom;A lot of help with walking and/or transfers   Equipment Recommendations  None recommended by PT    Recommendations for Other Services       Precautions / Restrictions Precautions Precautions: Fall Restrictions Weight Bearing Restrictions: No     Mobility  Bed Mobility Overal bed mobility: Needs Assistance Bed Mobility: Supine to Sit, Sit to Supine     Supine to sit: Min assist, Mod assist     General bed mobility comments: Min/mod assist with supine to sit. Difficulty moving B LE due to mild discomfort and generalized weakness. Able to get onto side but cannot push up from elbow. Trunk stabilization and HHE provided for patient to reaching sitting position. Improved trunk control with ability to scoot to EOB    Transfers Overall transfer level: Needs assistance Equipment used: Rolling walker (2 wheels) Transfers: Sit to/from Stand Sit  to Stand: Mod assist   Step pivot transfers: Mod assist       General transfer comment: Mod assist with  transfers. Patient able to initiate STS but has difficulty reaching full B knee extension and trunk control deficits. Requires trunk control provided by PT to stand but fatigues quickly. Difficulty with coordination along with moderate balance deficits.    Ambulation/Gait Ambulation/Gait assistance: Mod assist Gait Distance (Feet): 3 Feet Assistive device: Rolling walker (2 wheels) Gait Pattern/deviations: Decreased step length - right, Decreased step length - left, Trunk flexed, Decreased stride length, Knees buckling, Knee flexed in stance - left Gait velocity: decreased     General Gait Details: Limited to a few steps with dificulty in coordination along with balance deficits impacting gait pattern and ability to weight shift   Stairs             Wheelchair Mobility    Modified Rankin (Stroke Patients Only)       Balance Overall balance assessment: Needs assistance Sitting-balance support: Bilateral upper extremity supported, Feet supported Sitting balance-Leahy Scale: Good Sitting balance - Comments: seated EOB with UE support   Standing balance support: Bilateral upper extremity supported, During functional activity, Reliant on assistive device for balance Standing balance-Leahy Scale: Poor Standing balance comment: Not able to maintain standing balance for longer than 10 seconds due to balance deficits and generalized weakness. Reliant on RW but not able to maintain position                            Cognition Arousal/Alertness: Awake/alert Behavior During Therapy: WFL for tasks assessed/performed Overall Cognitive Status: Within Functional Limits for tasks assessed                                          Exercises General Exercises - Lower Extremity Long Arc Quad: AROM, 15 reps, Strengthening, Seated, Both Hip Flexion/Marching: 15 reps, AROM, Strengthening, Seated, Both Toe Raises: AROM, 20 reps, Strengthening, Seated, Both Heel  Raises: AROM, 20 reps, Strengthening, Both, Seated    General Comments        Pertinent Vitals/Pain Pain Assessment Pain Assessment: Faces Faces Pain Scale: Hurts a little bit Pain Location: left leg Pain Intervention(s): Limited activity within patient's tolerance, Monitored during session, Repositioned    Home Living                          Prior Function            PT Goals (current goals can now be found in the care plan section) Acute Rehab PT Goals Patient Stated Goal: return home PT Goal Formulation: With patient Time For Goal Achievement: 10/06/21 Potential to Achieve Goals: Good Progress towards PT goals: Progressing toward goals    Frequency    Min 3X/week      PT Plan Current plan remains appropriate    Co-evaluation              AM-PAC PT "6 Clicks" Mobility   Outcome Measure  Help needed turning from your back to your side while in a flat bed without using bedrails?: A Little Help needed moving from lying on your back to sitting on the side of a flat bed without using bedrails?: A Lot Help needed moving to and from a bed to a chair (  including a wheelchair)?: A Lot Help needed standing up from a chair using your arms (e.g., wheelchair or bedside chair)?: A Lot Help needed to walk in hospital room?: A Lot Help needed climbing 3-5 steps with a railing? : Total 6 Click Score: 12    End of Session   Activity Tolerance: Patient tolerated treatment well;Patient limited by fatigue;Patient limited by pain Patient left: in bed;with call bell/phone within reach Nurse Communication: Mobility status PT Visit Diagnosis: Unsteadiness on feet (R26.81);Other abnormalities of gait and mobility (R26.89);Muscle weakness (generalized) (M62.81)     Time: 1610-9604 PT Time Calculation (min) (ACUTE ONLY): 24 min  Charges:  $Therapeutic Exercise: 8-22 mins $Therapeutic Activity: 8-22 mins                     {11:22 AM, 09/24/21 Lestine Box, S/PT

## 2021-09-24 NOTE — Assessment & Plan Note (Signed)
-   Stable overall -Continue heart healthy diet -Continue adequate hydration patient started on low-dose Lasix.

## 2021-09-24 NOTE — Assessment & Plan Note (Signed)
-   Continue the use of Eliquis.

## 2021-09-24 NOTE — Assessment & Plan Note (Signed)
-   Continue dry compression dressings and skin care; no open wounds currently.

## 2021-09-24 NOTE — Assessment & Plan Note (Signed)
-   Seen by physical therapy with recommendations for conditioning and rehab at skilled nursing facility at discharge.

## 2021-09-24 NOTE — Assessment & Plan Note (Signed)
-   No chest pain, no acute ischemic changes on telemetry or EKG. -Most likely in the setting of CK elevation from trauma/falls.

## 2021-09-24 NOTE — TOC Progression Note (Signed)
Transition of Care [facility]) - Progression Note    Patient Details  Name: [name] [name] MRN: [id number] Date of Birth: 1932/06/30  Transition of [facility]) CM/SW Contact  Salome Arnt, [place] Phone Number: 09/24/2021, 9:12 AM  Clinical Narrative:  Bed offers provided and pt chooses [facility]. Will update authorization.      Expected Discharge Plan: [place] Barriers to Discharge: Continued Medical Work up  Expected Discharge Plan and Services Expected Discharge Plan: [place] In-house Referral: Clinical Social Work   Post Acute Care Choice: Resumption of Svcs/PTA Provider Living arrangements for the past 2 months: Single Family Home                           HH Arranged: RN, PT [facility] Agency: [place] Date [place]: 09/22/21 Time [place]: 1109 Representative spoke with at [place]: Cindie   Social Determinants of Health ([place]) Interventions    Readmission Risk Interventions     No data to display

## 2021-09-24 NOTE — Assessment & Plan Note (Signed)
-   Traumatic in the setting of falls. -CK level over 8000 at time of admission -Normal renal function -Improved with fluid resuscitation and less than 1000 at discharge -Patient found in need of rehabilitation/conditioning at a skilled nursing facility at time of discharge. -Advised to maintain adequate hydration.

## 2021-09-24 NOTE — Assessment & Plan Note (Signed)
-   Makes hyperlipidemia -Continue the use of Zetia -Heart healthy diet discussed with patient.

## 2021-09-25 ENCOUNTER — Encounter: Payer: Self-pay | Admitting: Adult Health

## 2021-09-25 ENCOUNTER — Non-Acute Institutional Stay (SKILLED_NURSING_FACILITY): Payer: Medicare HMO | Admitting: Adult Health

## 2021-09-25 DIAGNOSIS — E785 Hyperlipidemia, unspecified: Secondary | ICD-10-CM | POA: Diagnosis not present

## 2021-09-25 DIAGNOSIS — R531 Weakness: Secondary | ICD-10-CM

## 2021-09-25 DIAGNOSIS — G7249 Other inflammatory and immune myopathies, not elsewhere classified: Secondary | ICD-10-CM | POA: Diagnosis not present

## 2021-09-25 DIAGNOSIS — R296 Repeated falls: Secondary | ICD-10-CM

## 2021-09-25 DIAGNOSIS — T796XXS Traumatic ischemia of muscle, sequela: Secondary | ICD-10-CM | POA: Diagnosis not present

## 2021-09-25 DIAGNOSIS — I825Z2 Chronic embolism and thrombosis of unspecified deep veins of left distal lower extremity: Secondary | ICD-10-CM

## 2021-09-25 DIAGNOSIS — R600 Localized edema: Secondary | ICD-10-CM

## 2021-09-25 DIAGNOSIS — G609 Hereditary and idiopathic neuropathy, unspecified: Secondary | ICD-10-CM

## 2021-09-25 DIAGNOSIS — I872 Venous insufficiency (chronic) (peripheral): Secondary | ICD-10-CM | POA: Diagnosis not present

## 2021-09-25 DIAGNOSIS — T796XXA Traumatic ischemia of muscle, initial encounter: Secondary | ICD-10-CM | POA: Diagnosis not present

## 2021-09-25 DIAGNOSIS — I700 Atherosclerosis of aorta: Secondary | ICD-10-CM

## 2021-09-25 DIAGNOSIS — E43 Unspecified severe protein-calorie malnutrition: Secondary | ICD-10-CM | POA: Insufficient documentation

## 2021-09-25 DIAGNOSIS — M150 Primary generalized (osteo)arthritis: Secondary | ICD-10-CM

## 2021-09-25 DIAGNOSIS — L97825 Non-pressure chronic ulcer of other part of left lower leg with muscle involvement without evidence of necrosis: Secondary | ICD-10-CM

## 2021-09-25 DIAGNOSIS — M159 Polyosteoarthritis, unspecified: Secondary | ICD-10-CM

## 2021-09-25 NOTE — Progress Notes (Signed)
Location:  [place] Room Number: 124-P Place of Service:  SNF (31)   CODE STATUS: DNR  Allergies  Allergen Reactions   Cellcept [Mycophenolate Mofetil]     Muscle cramps, mouth soreness, difficulty swallowing, bruising   Statins     Dx of in   Ciprofloxacin Hcl Other (See Comments)    Chills, sick, could not tolerate it   Hydrocodone Nausea Only   Penicillins Rash    Chief Complaint  Patient presents with   Hospitalization Follow-up    HPI:  She is a 88 year old woman who has been hospitalized from 09-20-21 through 09-24-21. Her medical history includes: left lower extremity dvt; hyperlipidemia; osteoarthritis; recurrent falls. She presented to the ED after sustaining a fall. She had slid out of bed the night before and was unable to get up. She was in the AM by family. She had generalized weakness. She does live alone and uses manual wheelchair.  She is here for short term rehab with her goal to return back home.  She will continue to be followed for her chronic illnesses including:    Chronic deep vein thrombosis (DVT) of distal vein of left lower extremity: Venous stasis ulcer of other part of left lower leg with muscle involvement without evidence of necrosis without varicose veins:  Bilateral lower extremity edema: Inflammatory myopathy      Past Medical History:  Diagnosis Date   Acute deep vein thrombosis (DVT) of left femoral vein ([place]) 02/07/2019   Dx 02/05/2019   Arthritis    spinal stenosis   Deep vein thrombosis (DVT) of left lower extremity ([place]) 08/04/2019   Elevated LFTs    secondary to fatty liver, negative work-up in 2011   Hypercholesterolemia    Hypertension    Lumbar spinal stenosis 01/14/2018   L3-4 and L4-5   Myopathy 02/15/2018   Osteoarthritis    right knee   Peripheral neuropathy 07/06/2018   PONV (postoperative nausea and vomiting)    Uterine cancer ([place]) 08/2006   grade 1, no recurrence up to 2013    Past Surgical  History:  Procedure Laterality Date   ABDOMINAL HYSTERECTOMY  2008   adenocarcinoma stage 1   APPENDECTOMY  1973   BREAST BIOPSY Left 2018   Benign   CHOLECYSTECTOMY  1973   COLONOSCOPY    06/21/2007   NLZ:JQBHAL rectum/Sigmoid diverticula, diminutive hepatic flexure polyp s/p bx. Benign.    COLONOSCOPY N/A 10/17/2012   Dr. Oneida Alar- moderate diverticulosis was noted in the sigmoid colon, moderate sized internal hemorrhoids. next tcs in10 years   ESOPHAGOGASTRODUODENOSCOPY N/A 03/20/2016   Dr. Oneida Alar, widely patent Schatzki ring, anemia due to ASA induced erosive gastritis, mild duodenitis. gastric bx benign without H.pylori.    FOOT SURGERY  2007   Pins in toes on left foot, 5 to 6 yrs ago   [place] N/A 03/20/2016   Procedure: GIVENS CAPSULE STUDY;  Surgeon: Danie Binder, MD;  [facility];  Service: Endoscopy;  Laterality: N/A;   MUSCLE BIOPSY Left 06/07/2018   Procedure: LEFT THIGH MUSCLE BIOPSY;  Surgeon: Erroll Luna, MD;  Location: [place];  Service: General;  Laterality: Left;   TONSILLECTOMY      Social History   Socioeconomic History   Marital status: Widowed    Spouse name: Jeneen Rinks    Number of children: 3   Years of education: 12   Highest education level: 12th grade  Occupational History   Occupation:  retired   Tobacco Use   Smoking status: Never   Smokeless tobacco: Never   Tobacco comments:    Never smoked  Vaping Use   Vaping Use: Never used  Substance and Sexual Activity   Alcohol use: No   Drug use: No   Sexual activity: Not Currently    Birth control/protection: Post-menopausal, Surgical  Other Topics Concern   Not on file  Social History Narrative   Husband Jeneen Rinks recently passed away   Caffeine use: soda daily   Right handed    Social Determinants of Health   Financial Resource Strain: Low Risk  (01/23/2021)   Overall Financial Resource Strain (CARDIA)    Difficulty of Paying Living Expenses: Not hard  at all  Food Insecurity: No Food Insecurity (01/23/2021)   Hunger Vital Sign    Worried About Running Out of Food in the Last Year: Never true    Ran Out of Food in the Last Year: Never true  Transportation Needs: No Transportation Needs (01/23/2021)   PRAPARE - Hydrologist (Medical): No    Lack of Transportation (Non-Medical): No  Physical Activity: Insufficiently Active (01/23/2021)   Exercise Vital Sign    Days of Exercise per Week: 4 days    Minutes of Exercise per Session: 30 min  Stress: No Stress Concern Present (01/23/2021)   [place]    Feeling of Stress : Not at all  Social Connections: Socially Isolated (01/23/2021)   Social Connection and Isolation Panel [NHANES]    Frequency of Communication with Friends and Family: Three times a week    Frequency of Social Gatherings with Friends and Family: Three times a week    Attends Religious Services: Never    Active Member of Clubs or Organizations: No    Attends Archivist Meetings: Never    Marital Status: Widowed  Intimate Partner Violence: Not At Risk (01/23/2021)   Humiliation, Afraid, Rape, and Kick questionnaire    Fear of Current or Ex-Partner: No    Emotionally Abused: No    Physically Abused: No    Sexually Abused: No   Family History  Problem Relation Age of Onset   Heart disease Father    Bladder Cancer Brother        in remission    Hypertension Sister    Dementia Sister    Diabetes type II Sister    Hypertension Sister    Aneurysm Sister    Hypertension Brother    Arthritis Brother    Hypertension Son    Colon cancer Neg Hx       VITAL SIGNS BP (!) 159/68   Pulse 84   Temp 97.8 F (36.6 C)   Resp 20   Ht 5' 4"  (1.626 m)   Wt 188 lb 4.8 oz (85.4 kg)   SpO2 97%   BMI 32.32 kg/m   Outpatient Encounter Medications as of 09/25/2021  Medication Sig   acetaminophen (TYLENOL) 650 MG CR tablet Take  650 mg by mouth every 6 (six) hours.   cholecalciferol (VITAMIN D3) 25 MCG (1000 UNIT) tablet Take 1 tablet (1,000 Units total) by mouth daily.   ELIQUIS 5 MG TABS tablet TAKE 1 TABLET TWICE DAILY   ezetimibe (ZETIA) 10 MG tablet TAKE 1 TABLET EVERY DAY   feeding supplement, GLUCERNA SHAKE, (GLUCERNA SHAKE) LIQD Take 237 mLs by mouth 3 (three) times daily between meals.   folic acid (FOLVITE) 1  MG tablet TAKE 1 TABLET EVERY DAY   furosemide (LASIX) 20 MG tablet Take 1 tablet (20 mg total) by mouth daily.   gabapentin (NEURONTIN) 100 MG capsule TAKE 1 CAPSULE EVERY MORNING AND TAKE 2 CAPSULES AT BEDTIME   hydrocortisone cream 0.5 % Apply spaingly twice daily to rash on right cheek for 5 days, then as needed   magnesium oxide (MAG-OX) 400 (240 Mg) MG tablet TAKE 1 TABLET TWICE DAILY   methotrexate 2.5 MG tablet Take 6 tablets (15 mg total) by mouth once a week. Caution:Chemotherapy. Protect from light.   nystatin (MYCOSTATIN/NYSTOP) powder Apply topically 2 (two) times daily.   phosphorus (K PHOS NEUTRAL) 155-852-130 MG tablet Take 1 tablet (250 mg total) by mouth 2 (two) times daily for 3 days.   predniSONE (DELTASONE) 5 MG tablet Take 2 tablets (10 mg total) by mouth daily with breakfast.   solifenacin (VESICARE) 10 MG tablet Take 1 tablet (10 mg total) by mouth daily.   No facility-administered encounter medications on file as of 09/25/2021.     SIGNIFICANT DIAGNOSTIC EXAMS  TODAY  09-20-21: chest x-ray: No active disease.   09-20-21: ct of head:  1. No acute intracranial abnormality. No skull fracture. 2. Mild chronic small vessel ischemia.  09-22-21: pelvic x-ray:  No evidence of acute left hip fracture or dislocation. If there is persistent clinical concern and patient is unable to bear weight, further evaluation with MR examination could be considered.  09-22-21: 2-d echo:  1. Left ventricular ejection fraction, by estimation, is 55%. The left  ventricle has normal function. The left  ventricle has no regional wall  motion abnormalities. Left ventricular diastolic parameters are consistent  with Grade I diastolic dysfunction  (impaired relaxation).   LABS REVIEWED: TODAY  09-20-21: wbc 19.0; hgb 13.5; hct 41.6; mcv 96.7 plt 204; glucose 140; bun 36; creat 1.16;  k+ 3.4; na++ 140; ca 9.0; gfr 46; CK 8060 09-21-21: wbc 12.9; hgb 12.5; hct 39.5 mcv 96.8 plt 170; glucose 116; bun 25; creat 0.79; k+ 3.4; na++ 142; ca 8.2; gfr >60; protein 5.4; albumin 2.8; ast 156; alt 60 mad 1.9; phos 1.9; CK 5285 09-22-21: glucose 101; bun 19; creat 0.69; k+ 3.6; na++ 139; ca 8.0; gfr >60; phos 1.9 albumin 2.6; CK 2044 09-24-21: CK 391  Review of Systems  Constitutional:  Negative for malaise/fatigue.  Respiratory:  Negative for cough and shortness of breath.   Cardiovascular:  Negative for chest pain, palpitations and leg swelling.  Gastrointestinal:  Negative for abdominal pain, constipation and heartburn.  Musculoskeletal:  Negative for back pain, joint pain and myalgias.  Skin: Negative.   Neurological:  Negative for dizziness.  Psychiatric/Behavioral:  The patient is not nervous/anxious.    Physical Exam Constitutional:      General: She is not in acute distress.    Appearance: She is well-developed. She is not diaphoretic.  Neck:     Thyroid: No thyromegaly.  Cardiovascular:     Rate and Rhythm: Normal rate and regular rhythm.     Pulses: Normal pulses.     Heart sounds: Normal heart sounds.  Pulmonary:     Effort: Pulmonary effort is normal. No respiratory distress.     Breath sounds: Normal breath sounds.  Abdominal:     General: Bowel sounds are normal. There is no distension.     Palpations: Abdomen is soft.     Tenderness: There is no abdominal tenderness.  Musculoskeletal:        General: Normal  range of motion.     Cervical back: Neck supple.     Right lower leg: No edema.     Left lower leg: No edema.  Lymphadenopathy:     Cervical: No cervical adenopathy.  Skin:     General: Skin is warm and dry.     Comments: Bilateral lower extremities have wraps in place   Neurological:     Mental Status: She is alert and oriented to person, place, and time.  Psychiatric:        Mood and Affect: Mood normal.       ASSESSMENT/ PLAN:  TODAY  Rhabdomyolysis, traumatic sequela. Generalized weakness; recurrent falls: will continue therapy as directed to improve upon her level of independence with her adls;  her goal is to return back home  2.   Chronic deep vein thrombosis (DVT) of distal vein of left lower extremity: is on chronic eliquis 5 mg twice daily   3. Venous stasis ulcer of other part of left lower leg with muscle involvement without evidence of necrosis without varicose veins: bilateral lower extremities with wraps in place. No indications of infection present.   4. Bilateral lower extremity edema: will continue lasix 20 mg daily is on k phos neutral: twice daily for 3 days.   5. Inflammatory myopathy: will continue methotrexate 15 mg weekly with folic acid 1 mg daily and prednisone 5 mg daily tylenol 650 mg every 6 hours   6. Hyperlipidemia ldl goal <100: will continue zetia 10 mg daily   7. Idiopathic peripheral neuropathy: will continue gabapentin 100 mg in the AM and 200 mg in the PM  8. Hypomagnesemia: level 1.9 will continue mag ox 400 mg twice daily   9. Severe protein calorie malnutrition: albumin 2.6 total protein 5.4: will continue ensure three times daily and will begin prostat 30 mL with meals.   10. Primary osteoarthritis involving multiple joints: will continue tylenol 650 mg every 6 hours   11. Aortic atherosclerosis (ct 05-06-18) is on zetia 10 mg daily   Will check cbc; cmp; mag; phos; ck     Ok Edwards NP [facility]  call [phone]

## 2021-09-26 ENCOUNTER — Non-Acute Institutional Stay (SKILLED_NURSING_FACILITY): Payer: Medicare HMO | Admitting: Internal Medicine

## 2021-09-26 ENCOUNTER — Encounter: Payer: Self-pay | Admitting: Internal Medicine

## 2021-09-26 DIAGNOSIS — T796XXD Traumatic ischemia of muscle, subsequent encounter: Secondary | ICD-10-CM

## 2021-09-26 DIAGNOSIS — R296 Repeated falls: Secondary | ICD-10-CM

## 2021-09-26 DIAGNOSIS — I10 Essential (primary) hypertension: Secondary | ICD-10-CM

## 2021-09-26 DIAGNOSIS — E43 Unspecified severe protein-calorie malnutrition: Secondary | ICD-10-CM | POA: Diagnosis not present

## 2021-09-26 DIAGNOSIS — R600 Localized edema: Secondary | ICD-10-CM | POA: Diagnosis not present

## 2021-09-26 NOTE — Assessment & Plan Note (Signed)
She states this has been present for months with lower extremities wraps over the last 3-4 weeks by home health aide.  Amlodipine is not an option to address persistent hypertension because of the edema.

## 2021-09-26 NOTE — Assessment & Plan Note (Signed)
She has been consistently hypertensive here at the SNF.  Initiation of low-dose metoprolol or carvedilol will be discussed with [facility] NP.

## 2021-09-26 NOTE — Assessment & Plan Note (Signed)
In the context of OA ,RA, protein caloric malnutriton and and advanced age she will be evaluated off Vesicare which is on the beers list. PT/OT at SNF as tolerated.

## 2021-09-26 NOTE — Assessment & Plan Note (Signed)
She has persistent left-sided discomfort in the upper and lower extremities.  CK will be repeated if there is progression of symptoms.

## 2021-09-26 NOTE — Assessment & Plan Note (Signed)
Current albumin 2.6 and total protein 5.4.  Nutritionist will consult at SNF.

## 2021-09-26 NOTE — Patient Instructions (Signed)
See assessment and plan under each diagnosis in the problem list and acutely for this visit 

## 2021-09-26 NOTE — Progress Notes (Signed)
NURSING HOME LOCATION:  [facility] ROOM NUMBER:  124 P  CODE STATUS:  Full Code  PCP:  Syliva Overman MD  This is a comprehensive admission note to this SNFperformed on this date less than 30 days from date of admission. Included are preadmission medical/surgical history; reconciled medication list; family history; social history and comprehensive review of systems.  Corrections and additions to the records were documented. Comprehensive physical exam was also performed. Additionally a clinical summary was entered for each active diagnosis pertinent to this admission in the Problem List to enhance continuity of care.  HPI:She was hospitalized 7/1 - 09/24/2021 , brought by EMS from home after suffering a fall.  She apparently had slid out of her bed the night PTA and was unable to rise due to generalized weakness.  She was found approximately noon the next day, the day of admission.  There was no neurologic or cardiac prodrome reported.  She does live alone but has home health aide as well as supportive family members who help her with ADLs.  In the ED blood pressure was 145/77 and she was intermittently tachypneic.  Leukocytosis was present with a white count of 19,000: This was attributed to stress demargination.  Creatinine was 1.16 with a GFR of 46 indicating CKD stage IIIa.  Total CK was 8060.  She received 1 L normal saline and sodium bicarb supplementation. CK did improve with a final value of 391.  White count dropped to 8600.  Renal function improved with a final creatinine of 0.69 and GFR greater than 60.  Albumin was 2.6 and total protein 5.4 indicating protein/caloric malnutrition.  Normochromic, normocytic anemia was present with H/H of 10.8/35.7.  Platelet count was mildly decreased at 149,000. Because of generalized weakness and history of frequent falls PT/OT recommended discharge to SNF for rehab.  Past medical and surgical history: Includes essential  hypertension, RA, history of chronic DVT, aortic atherosclerosis, NASH, GERD, peripheral neuropathy, history of herpes zoster complicated by postherpetic neuralgia, history of lumbar spinal stenosis, protein/caloric malnutrition, dyslipidemia, and history of uterine cancer. Surgeries and procedures include abdominal hysterectomy,cholecystectomy, breast biopsy, EGD, and colonoscopies.  Social history: Non drinker, non-smoker.  Family history: Noncontributory due to advanced age.   Review of systems: She is alert and oriented and an excellent historian.  She validates that there was no cardiac or neurologic prodrome prior to her fall.  She stated that she simply slid out of bed when she attempted to get up because she felt she had to urinate.  She was on the floor for an unknown period of time but was not found until lunchtime the next day.  She has residual soreness in the left lower extremity and left upper extremity laterally.  She describes chronic weeping from the lower extremities. She has had the lower extremities wrapped for the last 3 to 4 weeks because of the weeping.  Edema has been present for months.   She describes significant weight gain over the last 3-4 months.  She did describe loose bowel movements prior to admission.  She also describes cold intolerance.  She describes some anxiety as " I sometimes feel alone."  Constitutional: No fever Eyes: No redness, discharge, pain, vision change ENT/mouth: No nasal congestion, purulent discharge, earache, change in hearing, sore throat  Cardiovascular: No chest pain, palpitations, paroxysmal nocturnal dyspnea, claudication  Respiratory: No cough, sputum production, hemoptysis, DOE, significant snoring, apnea  Gastrointestinal: No heartburn, dysphagia, abdominal pain, nausea /vomiting, rectal bleeding, melena  Genitourinary: No dysuria, hematuria, pyuria, incontinence, nocturia Dermatologic: No rash, pruritus, change in appearance of  skin Neurologic: No dizziness, headache, syncope, seizures, numbness, tingling Psychiatric: No significant insomnia, anorexia Endocrine: No change in hair/skin/nails, excessive thirst, excessive hunger, excessive urination  Hematologic/lymphatic: No significant bruising, lymphadenopathy, abnormal bleeding Allergy/immunology: No itchy/watery eyes, significant sneezing, urticaria, angioedema  Physical exam:  Pertinent or positive findings: She appears younger than her stated age.  Her voice is very low and content difficult to discern.  Eyebrows are decreased laterally.  The lower lids are puffy, greater on the left than the right.  She is missing several upper and lower teeth.  Heart sounds are markedly distant.  She has minor low-grade rales posteriorly.  Abdomen is protuberant.  Pedal pulses are not palpable.  Both lower extremities are wrapped.  She has mild ecchymotic changes over the left upper extremity.  Fingernails are elongated.  Deep tendon reflexes are 1+ in the upper extremities and 1/2+ in the lower extremities.  General appearance: Adequately nourished; no acute distress, increased work of breathing is present.   Lymphatic: No lymphadenopathy about the head, neck, axilla. Eyes: No conjunctival inflammation or lid edema is present. There is no scleral icterus. Ears:  External ear exam shows no significant lesions or deformities.   Nose:  External nasal examination shows no deformity or inflammation. Nasal mucosa are pink and moist without lesions, exudates Oral exam: Lips and gums are healthy appearing.There is no oropharyngeal erythema or exudate. Neck:  No thyromegaly, masses, tenderness noted.    Heart:  No definite gallop, murmur, click, rub.  Lungs:  without wheezes, rhonchi, rubs. Abdomen: Bowel sounds are normal.  Abdomen is soft and nontender with no organomegaly, hernias, masses. GU: Deferred  Extremities:  No cyanosis, clubbing. Neurologic exam: Balance, Rhomberg, finger  to nose testing could not be completed due to clinical state Skin: Warm & dry w/o tenting. No significant visible lesions or rash.  See clinical summary under each active problem in the Problem List with associated updated therapeutic plan

## 2021-09-29 ENCOUNTER — Non-Acute Institutional Stay (SKILLED_NURSING_FACILITY): Payer: Medicare HMO | Admitting: Adult Health

## 2021-09-29 ENCOUNTER — Encounter: Payer: Self-pay | Admitting: Adult Health

## 2021-09-29 ENCOUNTER — Other Ambulatory Visit (HOSPITAL_COMMUNITY)
Admission: RE | Admit: 2021-09-29 | Discharge: 2021-09-29 | Disposition: A | Discharge status: Home / Self Care | Payer: Medicare HMO | Source: Skilled Nursing Facility | Attending: Adult Health | Admitting: Adult Health

## 2021-09-29 DIAGNOSIS — E876 Hypokalemia: Secondary | ICD-10-CM | POA: Diagnosis not present

## 2021-09-29 DIAGNOSIS — I10 Essential (primary) hypertension: Secondary | ICD-10-CM

## 2021-09-29 DIAGNOSIS — M6282 Rhabdomyolysis: Secondary | ICD-10-CM | POA: Insufficient documentation

## 2021-09-29 LAB — COMPREHENSIVE METABOLIC PANEL
ALT: 55 U/L — ABNORMAL HIGH (ref 0–44)
AST: 40 U/L (ref 15–41)
Albumin: 2.9 g/dL — ABNORMAL LOW (ref 3.5–5.0)
Alkaline Phosphatase: 41 U/L (ref 38–126)
Anion gap: 5 (ref 5–15)
BUN: 31 mg/dL — ABNORMAL HIGH (ref 8–23)
CO2: 31 mmol/L (ref 22–32)
Calcium: 8.6 mg/dL — ABNORMAL LOW (ref 8.9–10.3)
Chloride: 109 mmol/L (ref 98–111)
Creatinine, Ser: 0.63 mg/dL (ref 0.44–1.00)
GFR, Estimated: >60 mL/min (ref >60)
Glucose, Bld: 89 mg/dL (ref 70–99)
Potassium: 3.1 mmol/L — ABNORMAL LOW (ref 3.5–5.1)
Sodium: 145 mmol/L (ref 135–145)
Total Bilirubin: 0.9 mg/dL (ref 0.3–1.2)
Total Protein: 5.6 g/dL — ABNORMAL LOW (ref 6.5–8.1)

## 2021-09-29 LAB — PHOSPHORUS: Phosphorus: 2.8 mg/dL (ref 2.5–4.6)

## 2021-09-29 LAB — CBC WITH DIFFERENTIAL/PLATELET
Abs Immature Granulocytes: 0.14 10*3/uL — ABNORMAL HIGH (ref 0.00–0.07)
Basophils Absolute: 0 10*3/uL (ref 0.0–0.1)
Basophils Relative: 1 %
Eosinophils Absolute: 0.2 10*3/uL (ref 0.0–0.5)
Eosinophils Relative: 3 %
HCT: 37.9 % (ref 36.0–46.0)
Hemoglobin: 11.9 g/dL — ABNORMAL LOW (ref 12.0–15.0)
Immature Granulocytes: 2 %
Lymphocytes Relative: 17 %
Lymphs Abs: 1 10*3/uL (ref 0.7–4.0)
MCH: 30.8 pg (ref 26.0–34.0)
MCHC: 31.4 g/dL (ref 30.0–36.0)
MCV: 98.2 fL (ref 80.0–100.0)
Monocytes Absolute: 0.5 10*3/uL (ref 0.1–1.0)
Monocytes Relative: 9 %
Neutro Abs: 4.1 10*3/uL (ref 1.7–7.7)
Neutrophils Relative %: 68 %
Platelets: 259 10*3/uL (ref 150–400)
RBC: 3.86 MIL/uL — ABNORMAL LOW (ref 3.87–5.11)
RDW: 14.5 % (ref 11.5–15.5)
WBC: 6 10*3/uL (ref 4.0–10.5)
nRBC: 0 % (ref 0.0–0.2)

## 2021-09-29 LAB — CK TOTAL AND CKMB (NOT AT ARMC)
CK, MB: 5.6 ng/mL — ABNORMAL HIGH (ref 0.5–5.0)
Relative Index: 4.4 — ABNORMAL HIGH (ref 0.0–2.5)
Total CK: 128 U/L (ref 38–234)

## 2021-09-29 LAB — MAGNESIUM: Magnesium: 1.9 mg/dL (ref 1.7–2.4)

## 2021-09-29 NOTE — Progress Notes (Signed)
Location:  [facility] Room Number: 124-P Place of Service:  SNF (31)   CODE STATUS: DNR  Allergies  Allergen Reactions   Cellcept [Mycophenolate Mofetil]     Muscle cramps, mouth soreness, difficulty swallowing, bruising   Statins     Dx of in   Ciprofloxacin Hcl Other (See Comments)    Chills, sick, could not tolerate it   Hydrocodone Nausea Only   Penicillins Rash    Chief Complaint  Patient presents with   Acute Visit    Hypertension and lab follow up      HPI:  Most of blood pressure readings have been below 150. She has had a couple of outliers of elevated readings. Her k+ today is 3.1 she presently off supplement.she is taking lasix 20 mg daily    Past Medical History:  Diagnosis Date   Acute deep vein thrombosis (DVT) of left femoral vein ([place]) 02/07/2019   Dx 02/05/2019   Arthritis    spinal stenosis   Deep vein thrombosis (DVT) of left lower extremity ([place]) 08/04/2019   Elevated LFTs    secondary to fatty liver, negative work-up in 2011   Hypercholesterolemia    Hypertension    Lumbar spinal stenosis 01/14/2018   L3-4 and L4-5   Myopathy 02/15/2018   Osteoarthritis    right knee   Peripheral neuropathy 07/06/2018   PONV (postoperative nausea and vomiting)    Uterine cancer ([place]) 08/2006   grade 1, no recurrence up to 2013    Past Surgical History:  Procedure Laterality Date   ABDOMINAL HYSTERECTOMY  2008   adenocarcinoma stage 1   APPENDECTOMY  1973   BREAST BIOPSY Left 2018   Benign   CHOLECYSTECTOMY  1973   COLONOSCOPY    06/21/2007   MEQ:ASTMHD rectum/Sigmoid diverticula, diminutive hepatic flexure polyp s/p bx. Benign.    COLONOSCOPY N/A 10/17/2012   Dr. Oneida Alar- moderate diverticulosis was noted in the sigmoid colon, moderate sized internal hemorrhoids. next tcs in10 years   ESOPHAGOGASTRODUODENOSCOPY N/A 03/20/2016   Dr. Oneida Alar, widely patent Schatzki ring, anemia due to ASA induced erosive gastritis, mild duodenitis.  gastric bx benign without H.pylori.    FOOT SURGERY  2007   Pins in toes on left foot, 5 to 6 yrs ago   [place] N/A 03/20/2016   Procedure: GIVENS CAPSULE STUDY;  Surgeon: Danie Binder, MD;  Location: [facility];  Service: Endoscopy;  Laterality: N/A;   MUSCLE BIOPSY Left 06/07/2018   Procedure: LEFT THIGH MUSCLE BIOPSY;  Surgeon: Erroll Luna, MD;  Location: [place];  Service: General;  Laterality: Left;   TONSILLECTOMY      Social History   Socioeconomic History   Marital status: Widowed    Spouse name: Jeneen Rinks    Number of children: 3   Years of education: 12   Highest education level: 12th grade  Occupational History   Occupation: retired   Tobacco Use   Smoking status: Never   Smokeless tobacco: Never   Tobacco comments:    Never smoked  Vaping Use   Vaping Use: Never used  Substance and Sexual Activity   Alcohol use: No   Drug use: No   Sexual activity: Not Currently    Birth control/protection: Post-menopausal, Surgical  Other Topics Concern   Not on file  Social History Narrative   Husband Jeneen Rinks recently passed away   Caffeine use: soda daily   Right handed    Social Determinants of  Health   Financial Resource Strain: Low Risk  (01/23/2021)   Overall Financial Resource Strain (CARDIA)    Difficulty of Paying Living Expenses: Not hard at all  Food Insecurity: No Food Insecurity (01/23/2021)   Hunger Vital Sign    Worried About Running Out of Food in the Last Year: Never true    Ran Out of Food in the Last Year: Never true  Transportation Needs: No Transportation Needs (01/23/2021)   PRAPARE - Hydrologist (Medical): No    Lack of Transportation (Non-Medical): No  Physical Activity: Insufficiently Active (01/23/2021)   Exercise Vital Sign    Days of Exercise per Week: 4 days    Minutes of Exercise per Session: 30 min  Stress: No Stress Concern Present (01/23/2021)   [place]    Feeling of Stress : Not at all  Social Connections: Socially Isolated (01/23/2021)   Social Connection and Isolation Panel [NHANES]    Frequency of Communication with Friends and Family: Three times a week    Frequency of Social Gatherings with Friends and Family: Three times a week    Attends Religious Services: Never    Active Member of Clubs or Organizations: No    Attends Archivist Meetings: Never    Marital Status: Widowed  Intimate Partner Violence: Not At Risk (01/23/2021)   Humiliation, Afraid, Rape, and Kick questionnaire    Fear of Current or Ex-Partner: No    Emotionally Abused: No    Physically Abused: No    Sexually Abused: No   Family History  Problem Relation Age of Onset   Heart disease Father    Bladder Cancer Brother        in remission    Hypertension Sister    Dementia Sister    Diabetes type II Sister    Hypertension Sister    Aneurysm Sister    Hypertension Brother    Arthritis Brother    Hypertension Son    Colon cancer Neg Hx       VITAL SIGNS BP 118/62   Pulse 82   Temp (!) 97.5 F (36.4 C)   Resp 18   Ht 5' 4"  (1.626 m)   Wt 179 lb (81.2 kg)   SpO2 98%   BMI 30.73 kg/m   Outpatient Encounter Medications as of 09/29/2021  Medication Sig   acetaminophen (TYLENOL) 650 MG CR tablet Take 650 mg by mouth every 6 (six) hours.   Amino Acids-Protein Hydrolys (FEEDING SUPPLEMENT, PRO-STAT SUGAR FREE 64,) LIQD Take 30 mLs by mouth 3 (three) times daily with meals. 15-100 gram-kcal/30 mL Special Instructions: 09-22-21: albumin 2.6; may mix with water or juice   cholecalciferol (VITAMIN D3) 25 MCG (1000 UNIT) tablet Take 1 tablet (1,000 Units total) by mouth daily.   ELIQUIS 5 MG TABS tablet TAKE 1 TABLET TWICE DAILY   ezetimibe (ZETIA) 10 MG tablet TAKE 1 TABLET EVERY DAY   feeding supplement, GLUCERNA SHAKE, (GLUCERNA SHAKE) LIQD Take 237 mLs by mouth 3 (three) times daily between  meals.   folic acid (FOLVITE) 1 MG tablet TAKE 1 TABLET EVERY DAY   furosemide (LASIX) 20 MG tablet Take 1 tablet (20 mg total) by mouth daily.   gabapentin (NEURONTIN) 100 MG capsule Take 100 mg by mouth daily.  2 caps = 264m; oral; At Bedtime; 08:00 PM  [DX: Hereditary and idiopathic neuropathy, unspecified]   magnesium oxide (MAG-OX) 400 (240 Mg) MG  tablet TAKE 1 TABLET TWICE DAILY   methotrexate 2.5 MG tablet Take 6 tablets (15 mg total) by mouth once a week. Caution:Chemotherapy. Protect from light.   nystatin (MYCOSTATIN/NYSTOP) powder Apply topically 2 (two) times daily.   predniSONE (DELTASONE) 5 MG tablet Take 2 tablets (10 mg total) by mouth daily with breakfast.   [DISCONTINUED] gabapentin (NEURONTIN) 100 MG capsule TAKE 1 CAPSULE EVERY MORNING AND TAKE 2 CAPSULES AT BEDTIME (Patient taking differently: TAKE 1 CAPSULE EVERY MORNING)   [DISCONTINUED] hydrocortisone cream 0.5 % Apply spaingly twice daily to rash on right cheek for 5 days, then as needed   [DISCONTINUED] solifenacin (VESICARE) 10 MG tablet Take 1 tablet (10 mg total) by mouth daily.   No facility-administered encounter medications on file as of 09/29/2021.     SIGNIFICANT DIAGNOSTIC EXAMS   PREVIOUS   09-20-21: chest x-ray: No active disease.   09-20-21: ct of head:  1. No acute intracranial abnormality. No skull fracture. 2. Mild chronic small vessel ischemia.  09-22-21: pelvic x-ray:  No evidence of acute left hip fracture or dislocation. If there is persistent clinical concern and patient is unable to bear weight, further evaluation with MR examination could be considered.  09-22-21: 2-d echo:  1. Left ventricular ejection fraction, by estimation, is 55%. The left  ventricle has normal function. The left ventricle has no regional wall  motion abnormalities. Left ventricular diastolic parameters are consistent  with Grade I diastolic dysfunction  (impaired relaxation).   NO NEW EXAMS.   LABS REVIEWED: PREVIOUS    09-20-21: wbc 19.0; hgb 13.5; hct 41.6; mcv 96.7 plt 204; glucose 140; bun 36; creat 1.16;  k+ 3.4; na++ 140; ca 9.0; gfr 46; CK 8060 09-21-21: wbc 12.9; hgb 12.5; hct 39.5 mcv 96.8 plt 170; glucose 116; bun 25; creat 0.79; k+ 3.4; na++ 142; ca 8.2; gfr >60; protein 5.4; albumin 2.8; ast 156; alt 60 mad 1.9; phos 1.9; CK 5285 09-22-21: glucose 101; bun 19; creat 0.69; k+ 3.6; na++ 139; ca 8.0; gfr >60; phos 1.9 albumin 2.6; CK 2044 09-24-21: CK 391  TODAY   09-29-21: wbc 6.0; hgb 11.9; hct 37.9; mcv 98.2 plt 259; glucose 98; bun 31; creat 0.63; k+ 3.1; na++ 145; ca 8.6; gfr >60; protein 5.6; albumin 2.9; mag 1.9; phos 2.8; CK 128   Review of Systems  Constitutional:  Negative for malaise/fatigue.  Respiratory:  Negative for cough and shortness of breath.   Cardiovascular:  Negative for chest pain, palpitations and leg swelling.  Gastrointestinal:  Negative for abdominal pain, constipation and heartburn.  Musculoskeletal:  Negative for back pain, joint pain and myalgias.  Skin: Negative.   Neurological:  Negative for dizziness.  Psychiatric/Behavioral:  The patient is not nervous/anxious.    Physical Exam Constitutional:      General: She is not in acute distress.    Appearance: She is well-developed. She is not diaphoretic.  Neck:     Thyroid: No thyromegaly.  Cardiovascular:     Rate and Rhythm: Normal rate and regular rhythm.     Pulses: Normal pulses.     Heart sounds: Normal heart sounds.  Pulmonary:     Effort: Pulmonary effort is normal. No respiratory distress.     Breath sounds: Normal breath sounds.  Abdominal:     General: Bowel sounds are normal. There is no distension.     Palpations: Abdomen is soft.     Tenderness: There is no abdominal tenderness.  Musculoskeletal:  General: Normal range of motion.     Cervical back: Neck supple.     Right lower leg: Edema present.     Left lower leg: Edema present.  Lymphadenopathy:     Cervical: No cervical adenopathy.   Skin:    General: Skin is warm and dry.  Neurological:     Mental Status: She is alert and oriented to person, place, and time.  Psychiatric:        Mood and Affect: Mood normal.       ASSESSMENT/ PLAN:  TODAY  Essential hypertension Hypokalemia  Will give k+ 40 now and then 20 meq daily will repeat k+ on 10-06-21.    Ok Edwards NP [facility]  call [phone]

## 2021-10-01 ENCOUNTER — Other Ambulatory Visit (HOSPITAL_COMMUNITY)
Admission: RE | Admit: 2021-10-01 | Discharge: 2021-10-01 | Disposition: A | Discharge status: Home / Self Care | Payer: Medicare HMO | Source: Skilled Nursing Facility | Attending: Internal Medicine | Admitting: Internal Medicine

## 2021-10-01 LAB — BASIC METABOLIC PANEL
Anion gap: 7 (ref 5–15)
BUN: 34 mg/dL — ABNORMAL HIGH (ref 8–23)
CO2: 30 mmol/L (ref 22–32)
Calcium: 8.7 mg/dL — ABNORMAL LOW (ref 8.9–10.3)
Chloride: 109 mmol/L (ref 98–111)
Creatinine, Ser: 0.68 mg/dL (ref 0.44–1.00)
GFR, Estimated: >60 mL/min (ref >60)
Glucose, Bld: 95 mg/dL (ref 70–99)
Potassium: 3.6 mmol/L (ref 3.5–5.1)
Sodium: 146 mmol/L — ABNORMAL HIGH (ref 135–145)

## 2021-10-01 LAB — CBC
HCT: 37.3 % (ref 36.0–46.0)
Hemoglobin: 11.5 g/dL — ABNORMAL LOW (ref 12.0–15.0)
MCH: 30.3 pg (ref 26.0–34.0)
MCHC: 30.8 g/dL (ref 30.0–36.0)
MCV: 98.4 fL (ref 80.0–100.0)
Platelets: 268 10*3/uL (ref 150–400)
RBC: 3.79 MIL/uL — ABNORMAL LOW (ref 3.87–5.11)
RDW: 14.6 % (ref 11.5–15.5)
WBC: 8 10*3/uL (ref 4.0–10.5)
nRBC: 0 % (ref 0.0–0.2)

## 2021-10-01 LAB — PHOSPHORUS: Phosphorus: 2.9 mg/dL (ref 2.5–4.6)

## 2021-10-01 LAB — MAGNESIUM: Magnesium: 2.1 mg/dL (ref 1.7–2.4)

## 2021-10-03 DIAGNOSIS — L97811 Non-pressure chronic ulcer of other part of right lower leg limited to breakdown of skin: Secondary | ICD-10-CM | POA: Diagnosis not present

## 2021-10-03 DIAGNOSIS — Z9181 History of falling: Secondary | ICD-10-CM

## 2021-10-03 DIAGNOSIS — I10 Essential (primary) hypertension: Secondary | ICD-10-CM | POA: Diagnosis not present

## 2021-10-03 DIAGNOSIS — Z7952 Long term (current) use of systemic steroids: Secondary | ICD-10-CM | POA: Diagnosis not present

## 2021-10-03 DIAGNOSIS — Z7901 Long term (current) use of anticoagulants: Secondary | ICD-10-CM | POA: Diagnosis not present

## 2021-10-03 DIAGNOSIS — Z79899 Other long term (current) drug therapy: Secondary | ICD-10-CM | POA: Diagnosis not present

## 2021-10-03 DIAGNOSIS — M1990 Unspecified osteoarthritis, unspecified site: Secondary | ICD-10-CM | POA: Diagnosis not present

## 2021-10-03 DIAGNOSIS — M48061 Spinal stenosis, lumbar region without neurogenic claudication: Secondary | ICD-10-CM | POA: Diagnosis not present

## 2021-10-03 DIAGNOSIS — I739 Peripheral vascular disease, unspecified: Secondary | ICD-10-CM | POA: Diagnosis not present

## 2021-10-03 DIAGNOSIS — L97821 Non-pressure chronic ulcer of other part of left lower leg limited to breakdown of skin: Secondary | ICD-10-CM | POA: Diagnosis not present

## 2021-10-06 ENCOUNTER — Other Ambulatory Visit (HOSPITAL_COMMUNITY)
Admission: RE | Admit: 2021-10-06 | Discharge: 2021-10-06 | Disposition: A | Discharge status: Home / Self Care | Payer: Medicare HMO | Source: Skilled Nursing Facility | Attending: Adult Health | Admitting: Adult Health

## 2021-10-06 DIAGNOSIS — E876 Hypokalemia: Secondary | ICD-10-CM | POA: Insufficient documentation

## 2021-10-06 LAB — POTASSIUM: Potassium: 4 mmol/L (ref 3.5–5.1)

## 2021-10-07 ENCOUNTER — Inpatient Hospital Stay (HOSPITAL_COMMUNITY): Payer: Medicare HMO | Attending: Hematology

## 2021-10-07 DIAGNOSIS — E559 Vitamin D deficiency, unspecified: Secondary | ICD-10-CM | POA: Insufficient documentation

## 2021-10-07 DIAGNOSIS — I82532 Chronic embolism and thrombosis of left popliteal vein: Secondary | ICD-10-CM

## 2021-10-07 DIAGNOSIS — Z8542 Personal history of malignant neoplasm of other parts of uterus: Secondary | ICD-10-CM | POA: Insufficient documentation

## 2021-10-07 DIAGNOSIS — D649 Anemia, unspecified: Secondary | ICD-10-CM | POA: Insufficient documentation

## 2021-10-07 DIAGNOSIS — I82402 Acute embolism and thrombosis of unspecified deep veins of left lower extremity: Secondary | ICD-10-CM | POA: Insufficient documentation

## 2021-10-07 DIAGNOSIS — Z7901 Long term (current) use of anticoagulants: Secondary | ICD-10-CM | POA: Insufficient documentation

## 2021-10-07 LAB — IRON AND TIBC
Iron: 70 ug/dL (ref 28–170)
Saturation Ratios: 22 % (ref 10.4–31.8)
TIBC: 312 ug/dL (ref 250–450)
UIBC: 242 ug/dL

## 2021-10-07 LAB — FERRITIN: Ferritin: 148 ng/mL (ref 11–307)

## 2021-10-07 LAB — CBC WITH DIFFERENTIAL/PLATELET
Abs Immature Granulocytes: 0.07 10*3/uL (ref 0.00–0.07)
Basophils Absolute: 0 10*3/uL (ref 0.0–0.1)
Basophils Relative: 1 %
Eosinophils Absolute: 0.3 10*3/uL (ref 0.0–0.5)
Eosinophils Relative: 3 %
HCT: 35.1 % — ABNORMAL LOW (ref 36.0–46.0)
Hemoglobin: 10.8 g/dL — ABNORMAL LOW (ref 12.0–15.0)
Immature Granulocytes: 1 %
Lymphocytes Relative: 12 %
Lymphs Abs: 1 10*3/uL (ref 0.7–4.0)
MCH: 30.9 pg (ref 26.0–34.0)
MCHC: 30.8 g/dL (ref 30.0–36.0)
MCV: 100.6 fL — ABNORMAL HIGH (ref 80.0–100.0)
Monocytes Absolute: 0.8 10*3/uL (ref 0.1–1.0)
Monocytes Relative: 10 %
Neutro Abs: 5.8 10*3/uL (ref 1.7–7.7)
Neutrophils Relative %: 73 %
Platelets: 302 10*3/uL (ref 150–400)
RBC: 3.49 MIL/uL — ABNORMAL LOW (ref 3.87–5.11)
RDW: 15.2 % (ref 11.5–15.5)
WBC: 7.9 10*3/uL (ref 4.0–10.5)
nRBC: 0 % (ref 0.0–0.2)

## 2021-10-07 LAB — D-DIMER, QUANTITATIVE: D-Dimer, Quant: 0.76 ug/mL-FEU — ABNORMAL HIGH (ref 0.00–0.50)

## 2021-10-07 LAB — VITAMIN D 25 HYDROXY (VIT D DEFICIENCY, FRACTURES): Vit D, 25-Hydroxy: 40.54 ng/mL (ref 30–100)

## 2021-10-08 ENCOUNTER — Non-Acute Institutional Stay (SKILLED_NURSING_FACILITY): Payer: Medicare HMO | Admitting: Adult Health

## 2021-10-08 DIAGNOSIS — I700 Atherosclerosis of aorta: Secondary | ICD-10-CM | POA: Diagnosis not present

## 2021-10-08 DIAGNOSIS — K219 Gastro-esophageal reflux disease without esophagitis: Secondary | ICD-10-CM

## 2021-10-08 DIAGNOSIS — K5909 Other constipation: Secondary | ICD-10-CM

## 2021-10-08 DIAGNOSIS — E43 Unspecified severe protein-calorie malnutrition: Secondary | ICD-10-CM

## 2021-10-09 ENCOUNTER — Encounter: Payer: Self-pay | Admitting: Adult Health

## 2021-10-09 ENCOUNTER — Telehealth: Payer: Self-pay | Admitting: Family Medicine

## 2021-10-09 ENCOUNTER — Other Ambulatory Visit: Payer: Self-pay | Admitting: Adult Health

## 2021-10-09 DIAGNOSIS — G7249 Other inflammatory and immune myopathies, not elsewhere classified: Secondary | ICD-10-CM

## 2021-10-09 MED ORDER — EZETIMIBE 10 MG PO TABS: 10.0000 mg | ORAL_TABLET | Freq: Every day | ORAL | 0 refills | Status: DC

## 2021-10-09 MED ORDER — GABAPENTIN 100 MG PO CAPS: 100.0000 mg | ORAL_CAPSULE | Freq: Every day | ORAL | 0 refills | Status: DC

## 2021-10-09 MED ORDER — MAGNESIUM OXIDE -MG SUPPLEMENT 400 (240 MG) MG PO TABS: 1.0000 | ORAL_TABLET | Freq: Two times a day (BID) | ORAL | 0 refills | Status: DC

## 2021-10-09 MED ORDER — FOLIC ACID 1 MG PO TABS
1.0000 mg | ORAL_TABLET | Freq: Every day | ORAL | 0 refills | Status: DC
Start: 2021-10-09 — End: 2022-10-23

## 2021-10-09 MED ORDER — APIXABAN 5 MG PO TABS: 5.0000 mg | ORAL_TABLET | Freq: Two times a day (BID) | ORAL | 0 refills | Status: DC

## 2021-10-09 MED ORDER — METHOTREXATE SODIUM 2.5 MG PO TABS: 15.0000 mg | ORAL_TABLET | ORAL | 0 refills | Status: DC

## 2021-10-09 MED ORDER — FUROSEMIDE 20 MG PO TABS
20.0000 mg | ORAL_TABLET | Freq: Every day | ORAL | 0 refills | Status: DC
Start: 2021-10-09 — End: 2022-07-24

## 2021-10-09 MED ORDER — PREDNISONE 5 MG PO TABS: 10.0000 mg | ORAL_TABLET | Freq: Every day | ORAL | 0 refills | Status: DC

## 2021-10-09 NOTE — Progress Notes (Signed)
Location:  [facility] Room Number: 124 Place of Service:  SNF (31)  Provider: Ok Edwards np   PCP: No primary care provider on file. Patient Care Team: Carole Civil, MD as Consulting Physician (Orthopedic Surgery) Irine Seal, MD as Attending Physician (Urology) Danie Binder, MD (Inactive) as Consulting Physician (Gastroenterology) Jovita Gamma, MD as Consulting Physician (Neurosurgery) Derek Jack, MD as Consulting Physician (Hematology)  Extended Emergency Contact Information Primary Emergency Contact: [place] Mobile Phone: [phone] Relation: Son Secondary Emergency Contact: [place] Mobile Phone: [phone] Relation: Relative  Code Status: full  Goals of care:  Advanced Directive information    09/29/2021   10:35 AM  Advanced Directives  Does Patient Have a Medical Advance Directive? Yes  Type of Advance Directive Living will;Out of facility DNR (pink MOST or yellow form)  Does patient want to make changes to medical advance directive? No - Patient declined  Pre-existing out of facility DNR order (yellow form or pink MOST form) Yellow form placed in chart (order not valid for inpatient use)     Allergies  Allergen Reactions   Cellcept [Mycophenolate Mofetil]     Muscle cramps, mouth soreness, difficulty swallowing, bruising   Statins     Dx of in   Ciprofloxacin Hcl Other (See Comments)    Chills, sick, could not tolerate it   Hydrocodone Nausea Only   Penicillins Rash    Chief Complaint  Patient presents with   Discharge Note    HPI:  88 y.o. female  being discharged to home with home health for pt/ot/rn. She will not need any dme. She will need to have her prescriptions written and will need to follow up with her medical providers. She had been hospitalized after fall and rhabdomyolysis. She was admitted to this facility for short term rehab. She has participated in therapy and ready to  discharge home.      Past Medical History:  Diagnosis Date   Acute deep vein thrombosis (DVT) of left femoral vein ([place]) 02/07/2019   Dx 02/05/2019   Arthritis    spinal stenosis   Deep vein thrombosis (DVT) of left lower extremity ([place]) 08/04/2019   Elevated LFTs    secondary to fatty liver, negative work-up in 2011   Hypercholesterolemia    Hypertension    Lumbar spinal stenosis 01/14/2018   L3-4 and L4-5   Myopathy 02/15/2018   Osteoarthritis    right knee   Peripheral neuropathy 07/06/2018   PONV (postoperative nausea and vomiting)    Uterine cancer ([place]) 08/2006   grade 1, no recurrence up to 2013    Past Surgical History:  Procedure Laterality Date   ABDOMINAL HYSTERECTOMY  2008   adenocarcinoma stage 1   APPENDECTOMY  1973   BREAST BIOPSY Left 2018   Benign   CHOLECYSTECTOMY  1973   COLONOSCOPY    06/21/2007   RJJ:OACZYS rectum/Sigmoid diverticula, diminutive hepatic flexure polyp s/p bx. Benign.    COLONOSCOPY N/A 10/17/2012   Dr. Oneida Alar- moderate diverticulosis was noted in the sigmoid colon, moderate sized internal hemorrhoids. next tcs in10 years   ESOPHAGOGASTRODUODENOSCOPY N/A 03/20/2016   Dr. Oneida Alar, widely patent Schatzki ring, anemia due to ASA induced erosive gastritis, mild duodenitis. gastric bx benign without H.pylori.    FOOT SURGERY  2007   Pins in toes on left foot, 5 to 6 yrs ago   [place] N/A 03/20/2016   Procedure: GIVENS CAPSULE STUDY;  Surgeon: Danie Binder, MD;  [facility];  Service: Endoscopy;  Laterality: N/A;   MUSCLE BIOPSY Left 06/07/2018   Procedure: LEFT THIGH MUSCLE BIOPSY;  Surgeon: Erroll Luna, MD;  Location: [facility];  Service: General;  Laterality: Left;   TONSILLECTOMY        reports that she has never smoked. She has never used smokeless tobacco. She reports that she does not drink alcohol and does not use drugs. Social History   Socioeconomic History   Marital status: Widowed     Spouse name: Jeneen Rinks    Number of children: 3   Years of education: 12   Highest education level: 12th grade  Occupational History   Occupation: retired   Tobacco Use   Smoking status: Never   Smokeless tobacco: Never   Tobacco comments:    Never smoked  Vaping Use   Vaping Use: Never used  Substance and Sexual Activity   Alcohol use: No   Drug use: No   Sexual activity: Not Currently    Birth control/protection: Post-menopausal, Surgical  Other Topics Concern   Not on file  Social History Narrative   Husband Jeneen Rinks recently passed away   Caffeine use: soda daily   Right handed    Social Determinants of Health   Financial Resource Strain: Low Risk  (01/23/2021)   Overall Financial Resource Strain (CARDIA)    Difficulty of Paying Living Expenses: Not hard at all  Food Insecurity: No Food Insecurity (01/23/2021)   Hunger Vital Sign    Worried About Running Out of Food in the Last Year: Never true    [place] in the Last Year: Never true  Transportation Needs: No Transportation Needs (01/23/2021)   PRAPARE - Hydrologist (Medical): No    Lack of Transportation (Non-Medical): No  Physical Activity: Insufficiently Active (01/23/2021)   Exercise Vital Sign    Days of Exercise per Week: 4 days    Minutes of Exercise per Session: 30 min  Stress: No Stress Concern Present (01/23/2021)   [place]    Feeling of Stress : Not at all  Social Connections: Socially Isolated (01/23/2021)   Social Connection and Isolation Panel [NHANES]    Frequency of Communication with Friends and Family: Three times a week    Frequency of Social Gatherings with Friends and Family: Three times a week    Attends Religious Services: Never    Active Member of Clubs or Organizations: No    Attends Archivist Meetings: Never    Marital Status: Widowed  Intimate Partner Violence: Not At Risk  (01/23/2021)   Humiliation, Afraid, Rape, and Kick questionnaire    Fear of Current or Ex-Partner: No    Emotionally Abused: No    Physically Abused: No    Sexually Abused: No      Allergies  Allergen Reactions   Cellcept [Mycophenolate Mofetil]     Muscle cramps, mouth soreness, difficulty swallowing, bruising   Statins     Dx of in   Ciprofloxacin Hcl Other (See Comments)    Chills, sick, could not tolerate it   Hydrocodone Nausea Only   Penicillins Rash    Pertinent  Health Maintenance Due  Topic Date Due   INFLUENZA VACCINE  10/21/2021   DEXA SCAN  Completed    Medications: Outpatient Encounter Medications as of 10/08/2021  Medication Sig   acetaminophen (TYLENOL) 650 MG CR tablet Take 650 mg by mouth  every 6 (six) hours.   Amino Acids-Protein Hydrolys (FEEDING SUPPLEMENT, PRO-STAT SUGAR FREE 64,) LIQD Take 30 mLs by mouth 3 (three) times daily with meals. 15-100 gram-kcal/30 mL Special Instructions: 09-22-21: albumin 2.6; may mix with water or juice   apixaban (ELIQUIS) 5 MG TABS tablet Take 1 tablet (5 mg total) by mouth 2 (two) times daily.   cholecalciferol (VITAMIN D3) 25 MCG (1000 UNIT) tablet Take 1 tablet (1,000 Units total) by mouth daily.   ezetimibe (ZETIA) 10 MG tablet Take 1 tablet (10 mg total) by mouth daily.   feeding supplement, GLUCERNA SHAKE, (GLUCERNA SHAKE) LIQD Take 237 mLs by mouth 3 (three) times daily between meals.   folic acid (FOLVITE) 1 MG tablet Take 1 tablet (1 mg total) by mouth daily.   furosemide (LASIX) 20 MG tablet Take 1 tablet (20 mg total) by mouth daily.   gabapentin (NEURONTIN) 100 MG capsule Take 1 capsule (100 mg total) by mouth daily. 2 caps = 287m; oral; At Bedtime; 08:00 PM [DX: Hereditary and idiopathic neuropathy, unspecified]   magnesium oxide (MAG-OX) 400 (240 Mg) MG tablet Take 1 tablet (400 mg total) by mouth 2 (two) times daily.   methotrexate 2.5 MG tablet Take 6 tablets (15 mg total) by mouth once a week.  Caution:Chemotherapy. Protect from light.   nystatin (MYCOSTATIN/NYSTOP) powder Apply topically 2 (two) times daily.   predniSONE (DELTASONE) 5 MG tablet Take 2 tablets (10 mg total) by mouth daily with breakfast.   No facility-administered encounter medications on file as of 10/08/2021.    Vitals:   10/08/21 1235  BP: 118/62  Pulse: 82  Temp: (!) 97.5 F (36.4 C)  Weight: 179 lb (81.2 kg)  Height: 5' 4"  (1.626 m)   Body mass index is 30.73 kg/m.   SIGNIFICANT DIAGNOSTIC EXAMS   PREVIOUS   09-20-21: chest x-ray: No active disease.   09-20-21: ct of head:  1. No acute intracranial abnormality. No skull fracture. 2. Mild chronic small vessel ischemia.  09-22-21: pelvic x-ray:  No evidence of acute left hip fracture or dislocation. If there is persistent clinical concern and patient is unable to bear weight, further evaluation with MR examination could be considered.  09-22-21: 2-d echo:  1. Left ventricular ejection fraction, by estimation, is 55%. The left  ventricle has normal function. The left ventricle has no regional wall  motion abnormalities. Left ventricular diastolic parameters are consistent  with Grade I diastolic dysfunction  (impaired relaxation).   NO NEW EXAMS.   LABS REVIEWED: PREVIOUS   09-20-21: wbc 19.0; hgb 13.5; hct 41.6; mcv 96.7 plt 204; glucose 140; bun 36; creat 1.16;  k+ 3.4; na++ 140; ca 9.0; gfr 46; CK 8060 09-21-21: wbc 12.9; hgb 12.5; hct 39.5 mcv 96.8 plt 170; glucose 116; bun 25; creat 0.79; k+ 3.4; na++ 142; ca 8.2; gfr >60; protein 5.4; albumin 2.8; ast 156; alt 60 mad 1.9; phos 1.9; CK 5285 09-22-21: glucose 101; bun 19; creat 0.69; k+ 3.6; na++ 139; ca 8.0; gfr >60; phos 1.9 albumin 2.6; CK 2044 09-24-21: CK 391 09-29-21: wbc 6.0; hgb 11.9; hct 37.9; mcv 98.2 plt 259; glucose 98; bun 31; creat 0.63; k+ 3.1; na++ 145; ca 8.6; gfr >60; protein 5.6; albumin 2.9; mag 1.9; phos 2.8; CK 128   NO NEW LABS.   Review of Systems  Constitutional:  Negative for  malaise/fatigue.  Respiratory:  Negative for cough and shortness of breath.   Cardiovascular:  Negative for chest pain, palpitations and leg swelling.  Gastrointestinal:  Negative for abdominal pain, constipation and heartburn.  Musculoskeletal:  Negative for back pain, joint pain and myalgias.  Skin: Negative.   Neurological:  Negative for dizziness.  Psychiatric/Behavioral:  The patient is not nervous/anxious.    Physical Exam Constitutional:      General: She is not in acute distress.    Appearance: She is well-developed. She is not diaphoretic.  Neck:     Thyroid: No thyromegaly.  Cardiovascular:     Rate and Rhythm: Normal rate and regular rhythm.     Pulses: Normal pulses.     Heart sounds: Normal heart sounds.  Pulmonary:     Effort: Pulmonary effort is normal. No respiratory distress.     Breath sounds: Normal breath sounds.  Abdominal:     General: Bowel sounds are normal. There is no distension.     Palpations: Abdomen is soft.     Tenderness: There is no abdominal tenderness.  Musculoskeletal:        General: Normal range of motion.     Cervical back: Neck supple.     Right lower leg: Edema present.     Left lower leg: Edema present.  Lymphadenopathy:     Cervical: No cervical adenopathy.  Skin:    General: Skin is warm and dry.  Neurological:     Mental Status: She is alert and oriented to person, place, and time.  Psychiatric:        Mood and Affect: Mood normal.        Assessment/Plan:    Patient is being discharged with the following home health services: pt/ot/rn: to evaluate and treat as indicated for gait balance strength adl training; medication management.    Patient is being discharged with the following durable medical equipment:  none needed   Patient has been advised to f/u with their PCP in 1-2 weeks to for a transitions of care visit.  Social services at their facility was responsible for arranging this appointment.  Pt was provided with  adequate prescriptions of noncontrolled medications to reach the scheduled appointment .  For controlled substances, a limited supply was provided as appropriate for the individual patient.  If the pt normally receives these medications from a pain clinic or has a contract with another physician, these medications should be received from that clinic or physician only).    A 30 day supply of her prescription have been sent to [facility] in [place]  Time spent with patient: 35 minutes: medications; home health; dme.    Ok Edwards NP [facility]   call [phone]

## 2021-10-09 NOTE — Telephone Encounter (Signed)
We have to wait until the day AFTER they are discharged and she'll show up on the report. Will do

## 2021-10-09 NOTE — Telephone Encounter (Signed)
Pt will be discharged tomorrow 10/10/2021. She is scheduled for TOC on 10/17/2021. Can you please do TOC call when available?

## 2021-10-13 DIAGNOSIS — M1990 Unspecified osteoarthritis, unspecified site: Secondary | ICD-10-CM

## 2021-10-13 DIAGNOSIS — L97811 Non-pressure chronic ulcer of other part of right lower leg limited to breakdown of skin: Secondary | ICD-10-CM | POA: Diagnosis not present

## 2021-10-13 DIAGNOSIS — L97821 Non-pressure chronic ulcer of other part of left lower leg limited to breakdown of skin: Secondary | ICD-10-CM | POA: Diagnosis not present

## 2021-10-13 DIAGNOSIS — M48061 Spinal stenosis, lumbar region without neurogenic claudication: Secondary | ICD-10-CM

## 2021-10-13 DIAGNOSIS — I739 Peripheral vascular disease, unspecified: Secondary | ICD-10-CM

## 2021-10-13 DIAGNOSIS — Z7901 Long term (current) use of anticoagulants: Secondary | ICD-10-CM

## 2021-10-13 DIAGNOSIS — I10 Essential (primary) hypertension: Secondary | ICD-10-CM | POA: Diagnosis not present

## 2021-10-13 DIAGNOSIS — Z9181 History of falling: Secondary | ICD-10-CM

## 2021-10-13 DIAGNOSIS — I872 Venous insufficiency (chronic) (peripheral): Secondary | ICD-10-CM | POA: Diagnosis not present

## 2021-10-13 DIAGNOSIS — Z79899 Other long term (current) drug therapy: Secondary | ICD-10-CM

## 2021-10-13 DIAGNOSIS — Z7952 Long term (current) use of systemic steroids: Secondary | ICD-10-CM

## 2021-10-13 NOTE — Progress Notes (Signed)
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  She was last seen in office by Tarri Abernethy PA-C on 04/16/2021.  At today's visit, she reports feeling fairly well.  She was recently hospitalized from 09/20/2021 through 09/24/2021 due to rhabdomyolysis secondary to fall with prolonged immobilization and spent 2 weeks at [facility] after hospitalization.  She remains on Eliquis, which she is tolerating well without major bleeding events.   She previously (July 2022) had some episodes of maroon-colored stools, but this resolved on its own and has not recurred. She denies any current symptoms of epistaxis, hematemesis, hematochezia, or melena.  She continues to have chronic bilateral leg swelling, with left greater than right likely due to post thrombotic changes.   She has had some blistering of her left leg, and is being followed by wound care.  She denies any shortness of breath, dyspnea on exertion, chest pain, cough, hemoptysis, and palpitations.  Regarding her history of endometrial cancer, she has not noticed any recent vaginal bleeding, abdominal pain, or B symptoms.    She has chronic fatigue, with energy at baseline of about 50 %. She has 50 % appetite and reports that she feels she is maintaining a stable weight.   REVIEW OF SYSTEMS:    Review of Systems  Constitutional:  Positive for fatigue. Negative for appetite change, chills, diaphoresis, fever and unexpected weight change.  HENT:   Negative for lump/mass and nosebleeds.   Eyes:  Negative for eye problems.  Respiratory:  Negative for  cough, hemoptysis and shortness of breath.   Cardiovascular:  Positive for leg swelling. Negative for chest pain and palpitations.  Gastrointestinal:  Negative for abdominal pain, blood in stool, constipation, diarrhea, nausea and vomiting.  Genitourinary:  Negative for bladder incontinence and hematuria.   Skin: Negative.   Neurological:  Positive for dizziness, headaches and numbness. Negative for light-headedness.  Hematological:  Does not bruise/bleed easily.  Psychiatric/Behavioral:  Positive for depression and sleep disturbance. The patient is nervous/anxious.       PAST MEDICAL/SURGICAL HISTORY:  Past Medical History:  Diagnosis Date   Acute deep vein thrombosis (DVT) of left femoral vein ([place]) 02/07/2019   Dx 02/05/2019   Arthritis    spinal stenosis   Deep vein thrombosis (DVT) of left lower extremity ([place]) 08/04/2019   Elevated LFTs    secondary to fatty liver, negative work-up in 2011   Hypercholesterolemia    Hypertension    Lumbar spinal stenosis 01/14/2018   L3-4 and L4-5   Myopathy 02/15/2018   Osteoarthritis    right knee   Peripheral neuropathy 07/06/2018   PONV (postoperative nausea and vomiting)    Uterine cancer ([place]) 08/2006   grade 1, no recurrence up to 2013   Past Surgical History:  Procedure Laterality Date   ABDOMINAL HYSTERECTOMY  2008   adenocarcinoma stage 1   APPENDECTOMY  1973   BREAST BIOPSY Left 2018   Benign   CHOLECYSTECTOMY  1973   COLONOSCOPY    06/21/2007   CVE:LFYBOF rectum/Sigmoid diverticula, diminutive hepatic flexure polyp s/p bx. Benign.    COLONOSCOPY N/A 10/17/2012  Dr. Oneida Alar- moderate diverticulosis was noted in the sigmoid colon, moderate sized internal hemorrhoids. next tcs in10 years   ESOPHAGOGASTRODUODENOSCOPY N/A 03/20/2016   Dr. Oneida Alar, widely patent Schatzki ring, anemia due to ASA induced erosive gastritis, mild duodenitis. gastric bx benign without H.pylori.    FOOT SURGERY  2007   Pins in toes on left foot, 5 to  6 yrs ago   [place] N/A 03/20/2016   Procedure: GIVENS CAPSULE STUDY;  Surgeon: Danie Binder, MD;  [facility];  Service: Endoscopy;  Laterality: N/A;   MUSCLE BIOPSY Left 06/07/2018   Procedure: LEFT THIGH MUSCLE BIOPSY;  Surgeon: Erroll Luna, MD;  Location: [facility];  Service: General;  Laterality: Left;   TONSILLECTOMY       SOCIAL HISTORY:  Social History   Socioeconomic History   Marital status: Widowed    Spouse name: Jeneen Rinks    Number of children: 3   Years of education: 12   Highest education level: 12th grade  Occupational History   Occupation: retired   Tobacco Use   Smoking status: Never   Smokeless tobacco: Never   Tobacco comments:    Never smoked  Vaping Use   Vaping Use: Never used  Substance and Sexual Activity   Alcohol use: No   Drug use: No   Sexual activity: Not Currently    Birth control/protection: Post-menopausal, Surgical  Other Topics Concern   Not on file  Social History Narrative   Husband Jeneen Rinks recently passed away   Caffeine use: soda daily   Right handed    Social Determinants of Health   Financial Resource Strain: Low Risk  (01/23/2021)   Overall Financial Resource Strain (CARDIA)    Difficulty of Paying Living Expenses: Not hard at all  Food Insecurity: No Food Insecurity (01/23/2021)   Hunger Vital Sign    Worried About Running Out of Food in the Last Year: Never true    [place] in the Last Year: Never true  Transportation Needs: No Transportation Needs (01/23/2021)   PRAPARE - Hydrologist (Medical): No    Lack of Transportation (Non-Medical): No  Physical Activity: Insufficiently Active (01/23/2021)   Exercise Vital Sign    Days of Exercise per Week: 4 days    Minutes of Exercise per Session: 30 min  Stress: No Stress Concern Present (01/23/2021)   [place]    Feeling of Stress : Not  at all  Social Connections: Socially Isolated (01/23/2021)   Social Connection and Isolation Panel [NHANES]    Frequency of Communication with Friends and Family: Three times a week    Frequency of Social Gatherings with Friends and Family: Three times a week    Attends Religious Services: Never    Active Member of Clubs or Organizations: No    Attends Archivist Meetings: Never    Marital Status: Widowed  Intimate Partner Violence: Not At Risk (01/23/2021)   Humiliation, Afraid, Rape, and Kick questionnaire    Fear of Current or Ex-Partner: No    Emotionally Abused: No    Physically Abused: No    Sexually Abused: No    FAMILY HISTORY:  Family History  Problem Relation Age of Onset   Heart disease Father    Bladder Cancer Brother        in remission    Hypertension Sister    Dementia Sister  Diabetes type II Sister    Hypertension Sister    Aneurysm Sister    Hypertension Brother    Arthritis Brother    Hypertension Son    Colon cancer Neg Hx     CURRENT MEDICATIONS:  Outpatient Encounter Medications as of 10/14/2021  Medication Sig   acetaminophen (TYLENOL) 650 MG CR tablet Take 650 mg by mouth every 6 (six) hours.   Amino Acids-Protein Hydrolys (FEEDING SUPPLEMENT, PRO-STAT SUGAR FREE 64,) LIQD Take 30 mLs by mouth 3 (three) times daily with meals. 15-100 gram-kcal/30 mL Special Instructions: 09-22-21: albumin 2.6; may mix with water or juice   apixaban (ELIQUIS) 5 MG TABS tablet Take 1 tablet (5 mg total) by mouth 2 (two) times daily.   cholecalciferol (VITAMIN D3) 25 MCG (1000 UNIT) tablet Take 1 tablet (1,000 Units total) by mouth daily.   ezetimibe (ZETIA) 10 MG tablet Take 1 tablet (10 mg total) by mouth daily.   feeding supplement, GLUCERNA SHAKE, (GLUCERNA SHAKE) LIQD Take 237 mLs by mouth 3 (three) times daily between meals.   folic acid (FOLVITE) 1 MG tablet Take 1 tablet (1 mg total) by mouth daily.   furosemide (LASIX) 20 MG tablet Take 1 tablet (20 mg  total) by mouth daily.   gabapentin (NEURONTIN) 100 MG capsule Take 1 capsule (100 mg total) by mouth daily. 2 caps = 261m; oral; At Bedtime; 08:00 PM [DX: Hereditary and idiopathic neuropathy, unspecified]   magnesium oxide (MAG-OX) 400 (240 Mg) MG tablet Take 1 tablet (400 mg total) by mouth 2 (two) times daily.   methotrexate 2.5 MG tablet Take 6 tablets (15 mg total) by mouth once a week. Caution:Chemotherapy. Protect from light.   nystatin (MYCOSTATIN/NYSTOP) powder Apply topically 2 (two) times daily.   predniSONE (DELTASONE) 5 MG tablet Take 2 tablets (10 mg total) by mouth daily with breakfast.   No facility-administered encounter medications on file as of 10/14/2021.    ALLERGIES:  Allergies  Allergen Reactions   Cellcept [Mycophenolate Mofetil]     Muscle cramps, mouth soreness, difficulty swallowing, bruising   Statins     Dx of in   Ciprofloxacin Hcl Other (See Comments)    Chills, sick, could not tolerate it   Hydrocodone Nausea Only   Penicillins Rash     PHYSICAL EXAM:    ECOG PERFORMANCE STATUS: 2 - Symptomatic, <50% confined to bed  There were no vitals filed for this visit. There were no vitals filed for this visit. Physical Exam Constitutional:      Appearance: Normal appearance.  HENT:     Head: Normocephalic and atraumatic.     Mouth/Throat:     Mouth: Mucous membranes are moist.  Eyes:     Extraocular Movements: Extraocular movements intact.     Pupils: Pupils are equal, round, and reactive to light.  Cardiovascular:     Rate and Rhythm: Normal rate and regular rhythm.     Pulses: Normal pulses.     Heart sounds: Normal heart sounds.  Pulmonary:     Effort: Pulmonary effort is normal.     Breath sounds: Normal breath sounds.  Abdominal:     General: Bowel sounds are normal.     Palpations: Abdomen is soft.     Tenderness: There is no abdominal tenderness.  Musculoskeletal:        General: No swelling.     Right lower leg: Edema (2+) present.      Left lower leg: Edema (2+, compression wrap in place) present.  Lymphadenopathy:     Cervical: No cervical adenopathy.  Skin:    General: Skin is warm and dry.  Neurological:     General: No focal deficit present.     Mental Status: She is alert and oriented to person, place, and time.  Psychiatric:        Mood and Affect: Mood normal.        Behavior: Behavior normal.     LABORATORY DATA:  I have reviewed the labs as listed.  CBC    Component Value Date/Time   WBC 7.9 10/07/2021 1021   RBC 3.49 (L) 10/07/2021 1021   HGB 10.8 (L) 10/07/2021 1021   HGB 12.6 03/25/2021 1218   HCT 35.1 (L) 10/07/2021 1021   HCT 37.8 03/25/2021 1218   PLT 302 10/07/2021 1021   PLT 223 03/25/2021 1218   MCV 100.6 (H) 10/07/2021 1021   MCV 93 03/25/2021 1218   MCH 30.9 10/07/2021 1021   MCHC 30.8 10/07/2021 1021   RDW 15.2 10/07/2021 1021   RDW 14.5 03/25/2021 1218   LYMPHSABS 1.0 10/07/2021 1021   LYMPHSABS 0.7 03/25/2021 1218   MONOABS 0.8 10/07/2021 1021   EOSABS 0.3 10/07/2021 1021   EOSABS 0.1 03/25/2021 1218   BASOSABS 0.0 10/07/2021 1021   BASOSABS 0.0 03/25/2021 1218      Latest Ref Rng & Units 10/06/2021    5:00 AM 10/01/2021    4:00 AM 09/29/2021    5:00 AM  CMP  Glucose 70 - 99 mg/dL  95  89   BUN 8 - 23 mg/dL  34  31   Creatinine 0.44 - 1.00 mg/dL  0.68  0.63   Sodium 135 - 145 mmol/L  146  145   Potassium 3.5 - 5.1 mmol/L 4.0  3.6  3.1   Chloride 98 - 111 mmol/L  109  109   CO2 22 - 32 mmol/L  30  31   Calcium 8.9 - 10.3 mg/dL  8.7  8.6   Total Protein 6.5 - 8.1 g/dL   5.6   Total Bilirubin 0.3 - 1.2 mg/dL   0.9   Alkaline Phos 38 - 126 U/L   41   AST 15 - 41 U/L   40   ALT 0 - 44 U/L   55     DIAGNOSTIC IMAGING:  I have independently reviewed the relevant imaging and discussed with the patient.  ASSESSMENT & PLAN: 1.  Recurrent left leg DVT (left femoral vein), weakly provoked - Initial occurrence on 02/05/2019, s/p fall prior to ER presentation; Doppler  showed occlusive DVT extending from proximal left femoral vein through left popliteal vein - Patient initially was on Eliquis from 02/05/2019 through 06/13/2019. - Repeat ultrasound on 08/05/2019 showed occlusive DVT involving left popliteal vein with extension to left posterior tibial vein and one of the paired left peroneal veins similar to 02/10/2019 imaging study - Ultrasound on 08/23/2019 showed no evidence of acute or chronic DVT within either lower extremity, with special attention to left popliteal and tibial veins.  There was noted to be a 16.6 x 3.9 x 3.5 cm complex fluid collection extending from the left popliteal fossa to the proximal mid aspect of the left calf, which was not seen on 08/05/2019 (this was found to be hemarthrosis of the left knee joint, s/p aspiration and Depo-Medrol injection by Dr. Aline Brochure on 09/08/2019 with 40 cc of aspirated blood) - Due to hemarthrosis described above, Eliquis was discontinued again on 09/05/2019 - Bilateral lower  extremity ultrasound on 09/18/2019 was negative for DVT - Bilateral venous ultrasound (11/13/2019): New partially occlusive thrombus in the left popliteal vein - Patient restarted on Eliquis 11/13/2019 - Left lower extremity venous US (03/08/2020): Resolution of left lower extremity DVT - Most recent D-dimer (10/07/2021): Mildly elevated at 0.76, but no current symptoms of recurrent DVT     - At this juncture, would recommend lifelong anticoagulation, as patient has had recurrent left lower extremity DVTs each time that Eliquis has been discontinued - Patient currently taking Eliquis 5 mg twice daily, tolerating well without any symptoms of blood loss.  She did report some maroon-colored stools in July 2022, but these resolved on their own and have not recurred.   - PLAN: Continue Eliquis indefinitely.  Repeat D-dimer and CBC at follow-up.  Patient educated on alarm symptoms that would prompt immediate medical attention.   2.  Normocytic anemia -  Likely anemia of chronic disease  - Renal function stable without obvious signs of renal dysfunction (most recent creatinine 10/01/2021 was normal at 0.68) - Vitamin B12 was last checked in 2021, normal at that time with normal methylmalonic acid.  Normal folate March 2022.   - Patient was instructed to stop taking iron tablet by her PCP  - No bright red blood per rectum or melena.    - Her anemia had previously resolved, but she has had some mild recurrence of anemia following her hospital stay in July 2023 for rhabdomyolysis - Most recent CBC (10/07/2021): Hgb 10.8/MCV 100.6, ferritin 148, iron saturation 22%.   - PLAN: We will check repeat CBC with iron panel, B12, methylmalonic acid, folate, copper, and SPEP in 3 months.     3.  Vitamin D deficiency - She is taking vitamin D 1000 units Monday/Wednesday/Friday (previously taking 50,000 units weekly, but decreased due to high-normal vitamin D levels) - Most recent vitamin D (10/07/2021): Normal at 40.54  - PLAN: Recommend increasing vitamin D to 1000 units daily supplement   - Repeat vitamin D level at next visit     4.  History of endometrial adenocarcinoma - She had T1b N0 well-differentiated endometrioid adenocarcinoma, FIGO grade 1, TAH/BSO with lymphadenectomy done on 12/14/2006.  She did not require any adjuvant therapy. CT scan on 03/06/2019 did not show any evidence of recurrence or metastatic disease - Denies vaginal bleeding, abdominal pain, B symptoms, unintentional weight loss       PLAN SUMMARY & DISPOSITION: Labs in 3 months Office visit 1 week after labs  All questions were answered. The patient knows to call the clinic with any problems, questions or concerns.  Medical decision making: Moderate    Time spent on visit: I spent 20 minutes counseling the patient face to face. The total time spent in the appointment was 30 minutes and more than 50% was on counseling.   Harriett Rush, PA-C  10/14/2021 10:24 AM

## 2021-10-14 ENCOUNTER — Telehealth: Payer: Self-pay

## 2021-10-14 ENCOUNTER — Inpatient Hospital Stay (HOSPITAL_COMMUNITY): Payer: Medicare HMO | Admitting: Physician Assistant

## 2021-10-14 VITALS — BP 115/73 | HR 82 | Temp 97.3°F | Resp 18 | Wt 171.0 lb

## 2021-10-14 DIAGNOSIS — I82532 Chronic embolism and thrombosis of left popliteal vein: Secondary | ICD-10-CM

## 2021-10-14 DIAGNOSIS — I82402 Acute embolism and thrombosis of unspecified deep veins of left lower extremity: Secondary | ICD-10-CM | POA: Diagnosis not present

## 2021-10-14 DIAGNOSIS — Z7901 Long term (current) use of anticoagulants: Secondary | ICD-10-CM | POA: Diagnosis not present

## 2021-10-14 DIAGNOSIS — E559 Vitamin D deficiency, unspecified: Secondary | ICD-10-CM | POA: Diagnosis not present

## 2021-10-14 DIAGNOSIS — D649 Anemia, unspecified: Secondary | ICD-10-CM | POA: Diagnosis not present

## 2021-10-14 DIAGNOSIS — Z8542 Personal history of malignant neoplasm of other parts of uterus: Secondary | ICD-10-CM | POA: Diagnosis not present

## 2021-10-14 NOTE — Patient Instructions (Signed)
[place] at [facility] Discharge Instructions  You were seen today by Tarri Abernethy PA-C for your anemia and history of blood clots.    Your iron levels look great, you do not need any IV iron at this time.  Your blood levels have been slightly low since your most recent hospitalization.  I want to make sure that your blood levels come back up to normal, so we will see you for additional labs and follow-up visit in 3 months.  I would like you to continue taking Vitamin D 1,000 units daily each day for a week.  (This is available over the counter.)  Please seek [place] if you notice any signs of bleeding while taking Eliquis, or have any symptoms of a blood clot in your legs or lungs.  LABS: Return in 3 months for repeat labs  OTHER TESTS: None  MEDICATIONS: Continue Eliquis 5 mg twice a day for your blood clots  FOLLOW-UP APPOINTMENT: Office visit in 3 months   Thank you for choosing [place] at [facility] to provide your oncology and hematology care.  To afford each patient quality time with our provider, please arrive at least 15 minutes before your scheduled appointment time.   If you have a lab appointment with the [facility] please come in thru the Main Entrance and check in at the main information desk.  You need to re-schedule your appointment should you arrive 10 or more minutes late.  We strive to give you quality time with our providers, and arriving late affects you and other patients whose appointments are after yours.  Also, if you no show three or more times for appointments you may be dismissed from the clinic at the providers discretion.     Again, thank you for choosing [facility].  Our hope is that these requests will decrease the amount of time that you wait before being seen by our physicians.       _____________________________________________________________  Should you  have questions after your visit to [facility], please contact our office at [phone] and follow the prompts.  Our office hours are 8:00 a.m. and 4:30 p.m. Monday - Friday.  Please note that voicemails left after 4:00 p.m. may not be returned until the following business day.  We are closed weekends and major holidays.  You do have access to a nurse 24-7, just call the main number to the clinic [phone] and do not press any options, hold on the line and a nurse will answer the phone.    For prescription refill requests, have your pharmacy contact our office and allow 72 hours.    Due to Covid, you will need to wear a mask upon entering the hospital. If you do not have a mask, a mask will be given to you at the Main Entrance upon arrival. For doctor visits, patients may have 1 support person age 2 or older with them. For treatment visits, patients can not have anyone with them due to social distancing guidelines and our immunocompromised population.

## 2021-10-14 NOTE — Telephone Encounter (Signed)
Ben called from [facility] care.  Recently discharged from the [facility] with home health pt orders. Is it okay for Dr. Moshe Cipro to give orders for seeing her frequency of 2 week 3 , 1 week 4  Call back [phone]

## 2021-10-14 NOTE — Telephone Encounter (Signed)
lvm

## 2021-10-15 ENCOUNTER — Other Ambulatory Visit: Payer: Self-pay | Admitting: Family Medicine

## 2021-10-17 ENCOUNTER — Encounter: Payer: Self-pay | Admitting: Family Medicine

## 2021-10-17 ENCOUNTER — Ambulatory Visit (INDEPENDENT_AMBULATORY_CARE_PROVIDER_SITE_OTHER): Payer: Medicare HMO | Admitting: Family Medicine

## 2021-10-17 VITALS — BP 115/71 | HR 96 | Resp 16

## 2021-10-17 DIAGNOSIS — L97921 Non-pressure chronic ulcer of unspecified part of left lower leg limited to breakdown of skin: Secondary | ICD-10-CM | POA: Diagnosis not present

## 2021-10-17 DIAGNOSIS — R600 Localized edema: Secondary | ICD-10-CM | POA: Diagnosis not present

## 2021-10-17 DIAGNOSIS — I872 Venous insufficiency (chronic) (peripheral): Secondary | ICD-10-CM

## 2021-10-17 DIAGNOSIS — R296 Repeated falls: Secondary | ICD-10-CM

## 2021-10-17 DIAGNOSIS — I10 Essential (primary) hypertension: Secondary | ICD-10-CM

## 2021-10-17 DIAGNOSIS — G7249 Other inflammatory and immune myopathies, not elsewhere classified: Secondary | ICD-10-CM

## 2021-10-17 DIAGNOSIS — T796XXD Traumatic ischemia of muscle, subsequent encounter: Secondary | ICD-10-CM

## 2021-10-17 DIAGNOSIS — N3941 Urge incontinence: Secondary | ICD-10-CM | POA: Diagnosis not present

## 2021-10-17 DIAGNOSIS — B0229 Other postherpetic nervous system involvement: Secondary | ICD-10-CM | POA: Diagnosis not present

## 2021-10-17 DIAGNOSIS — R531 Weakness: Secondary | ICD-10-CM

## 2021-10-17 DIAGNOSIS — L97825 Non-pressure chronic ulcer of other part of left lower leg with muscle involvement without evidence of necrosis: Secondary | ICD-10-CM

## 2021-10-17 NOTE — Assessment & Plan Note (Signed)
Chronic, compression hose beneficial

## 2021-10-17 NOTE — Assessment & Plan Note (Signed)
Currently has compression dressing to right leg for treatment

## 2021-10-17 NOTE — Assessment & Plan Note (Signed)
improved

## 2021-10-17 NOTE — Assessment & Plan Note (Signed)
Still reports intermittent burning pain

## 2021-10-17 NOTE — Assessment & Plan Note (Signed)
At high fall risk, needs to wear life alert necklace at all times, and also home safety reviewed

## 2021-10-17 NOTE — Assessment & Plan Note (Signed)
Continue methotrexate and prednisone

## 2021-10-17 NOTE — Progress Notes (Signed)
   [name] [name]     MRN: [id number]      DOB: [birth year]   HPI [name] [name] is here for follow up . She was hospitalized from 7/01 to 09/24/2021  with rhabdomyolysis, and then in the SNF until 21/2023. She is doing fairly well back at home to present time She has help for 20 hours per week ' She has some concerens about possible early weeping of left leg but nursing is checking on her regularly Apetite os fair, bM regular, sleep not the best due to anxiety , but sher makes up for it throughout the day ROS Denies recent fever or chills. Denies sinus pressure, nasal congestion, ear pain or sore throat. Denies chest congestion, productive cough or wheezing. Denies chest pains, palpitations c/o chronic  leg swelling Denies abdominal pain, nausea, vomiting,diarrhea or constipation.   Denies dysuria, frequency, hesitancy c/o  incontinence. Chronic joint pain,  and limitation in mobility. Denies headaches, seizures, chronic lower extremity  numbness, and  tingling. Denies depression, anxiety or insomnia.  PE  BP 115/71   Pulse 96   Resp 16   SpO2 95%   Patient alert and oriented and in no cardiopulmonary distress.  HEENT: No facial asymmetry, EOMI,     Neck decreased ROM .  Chest: Clear to auscultation bilaterally.  CVS: S1, S2  murmurs, no S3.Regular rate.  ABD: Soft non tender.   Ext: trace  edema  MS: decreased  ROM spine, shoulders, hips and knees.  Psych: Good eye contact, normal affect. Memory intact not anxious or depressed appearing.  CNS: CN 2-12 intact, power,  normal throughout.no focal deficits noted.   Assessment & Plan  Rhabdomyolysis Resolved, fall risk reduction  Discussed and the need to seek medical help in the event of a fall stressed  Urinary incontinence Continue medication  Lower extremity edema Chronic, compression hose beneficial  Inflammatory myopathy Continue methotrexate and prednisone  HZV (herpes zoster virus) post herpetic neuralgia Still  reports intermittent burning pain  Leg ulcer, left (HCC) healed  Recurrent falls At high fall risk, needs to wear life alert necklace at all times, and also home safety reviewed  Venous stasis ulcers (HCC) Currently has compression dressing to right leg for treatment  Weakness generalized improved

## 2021-10-17 NOTE — Assessment & Plan Note (Signed)
healed

## 2021-10-17 NOTE — Assessment & Plan Note (Signed)
Continue medication.

## 2021-10-17 NOTE — Patient Instructions (Signed)
F/U end September, call if you need me sooner  cBC, chem 7 and EGFR today  No changes in medication at this  time  Careful not to fall  You do look better than you did at your last visit, which is good!  Thanks for choosing [facility], we consider it a privelige to serve you.

## 2021-10-17 NOTE — Assessment & Plan Note (Signed)
Resolved, fall risk reduction  Discussed and the need to seek medical help in the event of a fall stressed

## 2021-10-18 LAB — CBC
Hematocrit: 34.4 % (ref 34.0–46.6)
Hemoglobin: 11.3 g/dL (ref 11.1–15.9)
MCH: 30.5 pg (ref 26.6–33.0)
MCHC: 32.8 g/dL (ref 31.5–35.7)
MCV: 93 fL (ref 79–97)
Platelets: 196 10*3/uL (ref 150–450)
RBC: 3.71 x10E6/uL — ABNORMAL LOW (ref 3.77–5.28)
RDW: 14.2 % (ref 11.7–15.4)
WBC: 7.4 10*3/uL (ref 3.4–10.8)

## 2021-10-18 LAB — BMP8+EGFR
BUN/Creatinine Ratio: 28 (ref 12–28)
BUN: 28 mg/dL — ABNORMAL HIGH (ref 8–27)
CO2: 25 mmol/L (ref 20–29)
Calcium: 9.8 mg/dL (ref 8.7–10.3)
Chloride: 102 mmol/L (ref 96–106)
Creatinine, Ser: 0.99 mg/dL (ref 0.57–1.00)
Glucose: 116 mg/dL — ABNORMAL HIGH (ref 70–99)
Potassium: 4.2 mmol/L (ref 3.5–5.2)
Sodium: 141 mmol/L (ref 134–144)
eGFR: 55 mL/min/{1.73_m2} — ABNORMAL LOW (ref >59)

## 2021-10-28 DIAGNOSIS — K5909 Other constipation: Secondary | ICD-10-CM | POA: Diagnosis not present

## 2021-10-28 DIAGNOSIS — I872 Venous insufficiency (chronic) (peripheral): Secondary | ICD-10-CM | POA: Diagnosis not present

## 2021-10-28 DIAGNOSIS — M1711 Unilateral primary osteoarthritis, right knee: Secondary | ICD-10-CM | POA: Diagnosis not present

## 2021-10-28 DIAGNOSIS — M48061 Spinal stenosis, lumbar region without neurogenic claudication: Secondary | ICD-10-CM | POA: Diagnosis not present

## 2021-10-28 DIAGNOSIS — E782 Mixed hyperlipidemia: Secondary | ICD-10-CM | POA: Diagnosis not present

## 2021-10-28 DIAGNOSIS — Z9181 History of falling: Secondary | ICD-10-CM

## 2021-10-28 DIAGNOSIS — I119 Hypertensive heart disease without heart failure: Secondary | ICD-10-CM | POA: Diagnosis not present

## 2021-10-28 DIAGNOSIS — M069 Rheumatoid arthritis, unspecified: Secondary | ICD-10-CM

## 2021-10-28 DIAGNOSIS — E43 Unspecified severe protein-calorie malnutrition: Secondary | ICD-10-CM

## 2021-10-28 DIAGNOSIS — E559 Vitamin D deficiency, unspecified: Secondary | ICD-10-CM

## 2021-10-28 DIAGNOSIS — Z8542 Personal history of malignant neoplasm of other parts of uterus: Secondary | ICD-10-CM

## 2021-10-28 DIAGNOSIS — L97811 Non-pressure chronic ulcer of other part of right lower leg limited to breakdown of skin: Secondary | ICD-10-CM | POA: Diagnosis not present

## 2021-10-28 DIAGNOSIS — K219 Gastro-esophageal reflux disease without esophagitis: Secondary | ICD-10-CM

## 2021-10-28 DIAGNOSIS — I700 Atherosclerosis of aorta: Secondary | ICD-10-CM

## 2021-10-28 DIAGNOSIS — I739 Peripheral vascular disease, unspecified: Secondary | ICD-10-CM | POA: Diagnosis not present

## 2021-10-28 DIAGNOSIS — Z86718 Personal history of other venous thrombosis and embolism: Secondary | ICD-10-CM

## 2021-10-28 DIAGNOSIS — G629 Polyneuropathy, unspecified: Secondary | ICD-10-CM | POA: Diagnosis not present

## 2021-10-30 ENCOUNTER — Ambulatory Visit: Payer: Medicare HMO | Admitting: Family Medicine

## 2021-10-30 ENCOUNTER — Telehealth: Payer: Self-pay | Admitting: *Deleted

## 2021-10-30 ENCOUNTER — Ambulatory Visit: Payer: Medicare HMO | Admitting: Urology

## 2021-10-30 NOTE — Telephone Encounter (Signed)
Patient called to advise that she has found a lump on her breast and wanted an appointment to see our office.  I advised her to contact Dr. Moshe Cipro, as we do not follow her for a cancer diagnosis and she generally orders her mammograms.  Verbalized understanding.

## 2021-11-04 ENCOUNTER — Ambulatory Visit: Payer: Medicare HMO | Admitting: Family Medicine

## 2021-11-06 ENCOUNTER — Encounter: Payer: Self-pay | Admitting: Family Medicine

## 2021-11-06 ENCOUNTER — Ambulatory Visit: Payer: Medicare HMO | Admitting: Neurology

## 2021-11-06 ENCOUNTER — Ambulatory Visit (INDEPENDENT_AMBULATORY_CARE_PROVIDER_SITE_OTHER): Payer: Medicare HMO | Admitting: Family Medicine

## 2021-11-06 DIAGNOSIS — I10 Essential (primary) hypertension: Secondary | ICD-10-CM

## 2021-11-06 DIAGNOSIS — N6311 Unspecified lump in the right breast, upper outer quadrant: Secondary | ICD-10-CM | POA: Diagnosis not present

## 2021-11-06 NOTE — Patient Instructions (Signed)
F/U as before , call if you need me sooner  You are referred for [place] of right breast, to look at the lump present  Careful not to fall  Thanks for choosing [facility], we consider it a privelige to serve you.

## 2021-11-12 ENCOUNTER — Other Ambulatory Visit: Payer: Self-pay | Admitting: Family Medicine

## 2021-11-12 DIAGNOSIS — N6310 Unspecified lump in the right breast, unspecified quadrant: Secondary | ICD-10-CM

## 2021-11-12 DIAGNOSIS — H353221 Exudative age-related macular degeneration, left eye, with active choroidal neovascularization: Secondary | ICD-10-CM | POA: Diagnosis not present

## 2021-11-12 DIAGNOSIS — H4312 Vitreous hemorrhage, left eye: Secondary | ICD-10-CM | POA: Diagnosis not present

## 2021-11-12 DIAGNOSIS — H25813 Combined forms of age-related cataract, bilateral: Secondary | ICD-10-CM | POA: Diagnosis not present

## 2021-11-17 DIAGNOSIS — M069 Rheumatoid arthritis, unspecified: Secondary | ICD-10-CM | POA: Diagnosis not present

## 2021-11-17 DIAGNOSIS — I5032 Chronic diastolic (congestive) heart failure: Secondary | ICD-10-CM | POA: Diagnosis not present

## 2021-11-18 ENCOUNTER — Ambulatory Visit (HOSPITAL_COMMUNITY)
Admission: RE | Admit: 2021-11-18 | Discharge: 2021-11-18 | Disposition: A | Discharge status: Home / Self Care | Payer: Medicare HMO | Source: Ambulatory Visit | Attending: Family Medicine | Admitting: Family Medicine

## 2021-11-18 DIAGNOSIS — N6310 Unspecified lump in the right breast, unspecified quadrant: Secondary | ICD-10-CM | POA: Insufficient documentation

## 2021-11-18 DIAGNOSIS — N6315 Unspecified lump in the right breast, overlapping quadrants: Secondary | ICD-10-CM | POA: Insufficient documentation

## 2021-11-18 DIAGNOSIS — R928 Other abnormal and inconclusive findings on diagnostic imaging of breast: Secondary | ICD-10-CM | POA: Diagnosis not present

## 2021-11-23 ENCOUNTER — Encounter: Payer: Self-pay | Admitting: Family Medicine

## 2021-11-23 NOTE — Assessment & Plan Note (Signed)
Controlled, no change in medication  

## 2021-11-23 NOTE — Assessment & Plan Note (Signed)
Urgent US and diagnostic mammogram

## 2021-11-23 NOTE — Progress Notes (Signed)
   [name] [name]     MRN: [id number]      DOB: 06-01-1932   HPI [name] [name] is here c/o lump on right breast , unsure of how long it has been present, no recent trauma. Denies nipple d/c or inversion, denies s wollen glands  ROS Denies recent fever or chills. Denies sinus pressure, nasal congestion, ear pain or sore throat. Denies chest congestion, productive cough or wheezing. Denies chest pains, palpitations and leg swelling Denies abdominal pain, nausea, vomiting,diarrhea or constipation.   Denies dysuria, frequency, hesitancy or incontinence. D. Denies skin break down or rash.   PE  BP 117/69   Pulse 92   Resp 16   SpO2 99%   Patient alert and oriented and in no cardiopulmonary distress.  HEENT: No facial asymmetry, EOMI,     Neck decreased ROM .  Chest: Clear to auscultation bilaterally. Breast: right breast mass at 11 o clock, no nipple inversion or palpable axillary or supra clavicular nodes CVS: S1, S2 no murmurs, no S3.Regular rate.  ABD: Soft non tender.   Ext: No edema  MS: Decreased ROM spine, shoulders, hips and knees.  Skin: Intact, no ulcerations or rash noted.  Psych: Good eye contact, normal affect. Memory intact not anxious or depressed appearing.  CNS: CN 2-12 intact, power,  normal throughout.no focal deficits noted.   Assessment & Plan  Breast lump on right side at 11 o'clock position Urgent US and diagnostic mammogram  Essential hypertension Controlled, no change in medication

## 2021-11-25 ENCOUNTER — Other Ambulatory Visit (HOSPITAL_COMMUNITY): Payer: Self-pay | Admitting: Hematology

## 2021-11-26 ENCOUNTER — Other Ambulatory Visit: Payer: Self-pay

## 2021-11-26 DIAGNOSIS — N6311 Unspecified lump in the right breast, upper outer quadrant: Secondary | ICD-10-CM

## 2021-11-27 ENCOUNTER — Other Ambulatory Visit (HOSPITAL_COMMUNITY): Payer: Self-pay | Admitting: Hematology

## 2021-12-04 ENCOUNTER — Encounter: Payer: Self-pay | Admitting: Family Medicine

## 2021-12-04 ENCOUNTER — Ambulatory Visit (INDEPENDENT_AMBULATORY_CARE_PROVIDER_SITE_OTHER): Payer: Medicare HMO | Admitting: Family Medicine

## 2021-12-04 VITALS — BP 118/72 | HR 89 | Ht 65.0 in | Wt 163.0 lb

## 2021-12-04 DIAGNOSIS — Z23 Encounter for immunization: Secondary | ICD-10-CM

## 2021-12-04 DIAGNOSIS — E785 Hyperlipidemia, unspecified: Secondary | ICD-10-CM

## 2021-12-04 DIAGNOSIS — Z0001 Encounter for general adult medical examination with abnormal findings: Secondary | ICD-10-CM

## 2021-12-04 DIAGNOSIS — I10 Essential (primary) hypertension: Secondary | ICD-10-CM

## 2021-12-04 DIAGNOSIS — R7303 Prediabetes: Secondary | ICD-10-CM

## 2021-12-04 NOTE — Patient Instructions (Addendum)
F/U in January, call if you need me before  Flu vaccine today.    Please get covid booster  All the best with eye treatment   Fasting lipid, cmp and EGFR and hBA1C 1 week before January     Please   get shingrix vaccines  Thanks for choosing [facility], we consider it a privelige to serve you.

## 2021-12-07 ENCOUNTER — Encounter: Payer: Self-pay | Admitting: Family Medicine

## 2021-12-07 DIAGNOSIS — Z0001 Encounter for general adult medical examination with abnormal findings: Secondary | ICD-10-CM | POA: Insufficient documentation

## 2021-12-07 NOTE — Assessment & Plan Note (Signed)

## 2021-12-07 NOTE — Progress Notes (Signed)
    [name]     MRN: [id number]      DOB: July 13, 1932  HPI: Patient is in for annual physical exam. No other health concerns are expressed or addressed at the visit. Recent labs,  are reviewed. Immunization is reviewed , and  updated if needed.   PE: BP 118/72   Pulse 89   Ht 5' 5"  (1.651 m)   Wt 163 lb 0.6 oz (74 kg)   SpO2 96%   BMI 27.13 kg/m   Pleasant  female, alert and oriented x 3, in no cardio-pulmonary distress. Afebrile. HEENT No facial trauma or asymetry. Sinuses non tender.  Extra occullar muscles intact.. External ears normal, . Neck: decreased ROM, no adenopathy,JVD or thyromegaly.No bruits.  Chest: Clear to ascultation bilaterally.No crackles or wheezes. Non tender to palpation  Cardiovascular system; Heart sounds normal,  S1 and  S2 ,no S3.  No murmur, or thrill.  Abdomen: Soft, non tender,      Musculoskeletal exam: Markedly decreased ROM of spine, hips , shoulders and knees.  deformity ,swelling or crepitus noted. muscle wasting no  atrophy.   Neurologic: Cranial nerves 2 to 12 intact. Power, tone ,sensation normal throughout. disturbance in gait. No tremor.ambulates with walker in her home  Skin: Intact, no scaling or rash noted.vesicle on left legnot purulent Pigmentation normal throughout  Psych; Normal mood and affect. Judgement and concentration normal   Assessment & Plan:  Encounter for Medicare annual examination with abnormal findings Annual exam as documented. Counseling done  re healthy lifestyle involving commitment to 150 minutes exercise per week, heart healthy diet, and attaining healthy weight.The importance of adequate sleep also discussed. Regular seat belt use and home safety, is also discussed. Changes in health habits are decided on by the patient with goals and time frames  set for achieving them. Immunization and cancer screening needs are specifically addressed at this visit.

## 2021-12-09 ENCOUNTER — Other Ambulatory Visit: Payer: Self-pay | Admitting: Family Medicine

## 2021-12-09 DIAGNOSIS — R928 Other abnormal and inconclusive findings on diagnostic imaging of breast: Secondary | ICD-10-CM

## 2021-12-10 DIAGNOSIS — H353221 Exudative age-related macular degeneration, left eye, with active choroidal neovascularization: Secondary | ICD-10-CM | POA: Diagnosis not present

## 2021-12-15 ENCOUNTER — Telehealth: Payer: Self-pay | Admitting: Family Medicine

## 2021-12-15 NOTE — Telephone Encounter (Signed)
Pt called stating she has boils again on her legs from the fluid. Wants to know if she can get the same medication/treatment she had previously for this?

## 2021-12-15 NOTE — Telephone Encounter (Signed)
Dr Moshe Cipro out until Wednesday. Would need evaluation by available provider

## 2021-12-15 NOTE — Telephone Encounter (Signed)
Spoke back with pt & she states she was told when she was seen on 12/04/21 something was supposed to have been called in for the boils?

## 2021-12-18 ENCOUNTER — Other Ambulatory Visit: Payer: Self-pay

## 2021-12-18 ENCOUNTER — Telehealth: Payer: Self-pay | Admitting: Neurology

## 2021-12-18 DIAGNOSIS — I872 Venous insufficiency (chronic) (peripheral): Secondary | ICD-10-CM

## 2021-12-18 MED ORDER — DOXYCYCLINE HYCLATE 100 MG PO TABS: 100.0000 mg | ORAL_TABLET | Freq: Two times a day (BID) | ORAL | 0 refills | Status: DC

## 2021-12-18 NOTE — Telephone Encounter (Signed)
Pt called wanting to know if she can stop taking her methotrexate 2.5 MG tablet due to having hair and teeth problems while on it.

## 2021-12-18 NOTE — Telephone Encounter (Signed)
C/o bilateral leg blisters, aware for about past 3 weeks, clear drainage, states skin  is open and draining, urgent referral for nurse to do wound assessment and doxy x 1 week prescribed, she is aware

## 2021-12-18 NOTE — Telephone Encounter (Signed)
I spoke with pt earlier today and this is addressed

## 2021-12-18 NOTE — Addendum Note (Signed)
Addended by: [name] E on: 12/18/2021 10:01 AM   Modules accepted: Orders

## 2021-12-18 NOTE — Telephone Encounter (Signed)
I called the pt back and we discussed the message. She is having unwanted s/e from the Methotrexate (loosing teeth and hair)  She is due for a f/u with our office anyway. I have scheduled for next week with SS, NP.

## 2021-12-25 ENCOUNTER — Ambulatory Visit: Payer: Medicare HMO | Admitting: Neurology

## 2021-12-25 ENCOUNTER — Encounter: Payer: Self-pay | Admitting: Neurology

## 2021-12-25 VITALS — BP 134/77 | HR 101

## 2021-12-25 DIAGNOSIS — R748 Abnormal levels of other serum enzymes: Secondary | ICD-10-CM | POA: Diagnosis not present

## 2021-12-25 DIAGNOSIS — G729 Myopathy, unspecified: Secondary | ICD-10-CM

## 2021-12-25 DIAGNOSIS — R269 Unspecified abnormalities of gait and mobility: Secondary | ICD-10-CM | POA: Diagnosis not present

## 2021-12-25 DIAGNOSIS — G7249 Other inflammatory and immune myopathies, not elsewhere classified: Secondary | ICD-10-CM

## 2021-12-25 DIAGNOSIS — R7989 Other specified abnormal findings of blood chemistry: Secondary | ICD-10-CM | POA: Diagnosis not present

## 2021-12-25 DIAGNOSIS — R531 Weakness: Secondary | ICD-10-CM

## 2021-12-25 DIAGNOSIS — Z13228 Encounter for screening for other metabolic disorders: Secondary | ICD-10-CM | POA: Diagnosis not present

## 2021-12-25 DIAGNOSIS — R6889 Other general symptoms and signs: Secondary | ICD-10-CM | POA: Diagnosis not present

## 2021-12-25 DIAGNOSIS — R7309 Other abnormal glucose: Secondary | ICD-10-CM | POA: Diagnosis not present

## 2021-12-25 MED ORDER — METHOTREXATE SODIUM 2.5 MG PO TABS: 12.5000 mg | ORAL_TABLET | ORAL | 11 refills | Status: DC

## 2021-12-25 MED ORDER — PREDNISONE 5 MG PO TABS: 10.0000 mg | ORAL_TABLET | Freq: Every day | ORAL | 5 refills | Status: DC

## 2021-12-25 NOTE — Patient Instructions (Addendum)
You can decrease the methotrexate to 12.5 mg weekly, monitor for worsening weakness  Check labs today  Continue the prednisone See you back 4-6 months

## 2021-12-25 NOTE — Progress Notes (Signed)
Patient: [name] [name] Date of Birth: 1932/10/24  Reason for Visit: Follow up History from: Patient Primary Neurologist: [name] [name]  ASSESSMENT AND PLAN 88 y.o. 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  Gait abnormality  -Biopsy-proven in March 2020 -Concern for side effect from methotrexate, would like to try dose reduction -Will reduce methotrexate to 12.5 mg weekly, continue prednisone 10 mg daily -Check routine labs -Monitor very closely for worsening weakness -Follow-up in 4 months  Orders Placed This Encounter  Procedures   CBC with Differential/Platelet   CMP   CK   TSH   Hemoglobin A1c   HISTORY [name] [name], is a 88 year old female, with history of inflammatory myositis, she was seen by [name] [name] in the past, her primary care physician is [name] [name], [name] [name],    I reviewed and summarized the referring note. PMHX   She was seen by [name] [name] since October 2019, complains of gradual onset of progressive weakness of both neck since 2016, getting worse in 2019 has to rely on her walker, with occasional falls,  She had long history of chronic low back pain, bladder incontinence, following her uterine surgery  She also has a history of fatty liver, was on statin treatment  EMG nerve conduction study on February 15, 2018 showed evidence of mild axonal sensorimotor polyneuropathy, EMG showed myopathic changes  Laboratory evaluation showed elevated CPK 1030,  Muscle biopsy of left thigh in March 2020 showed inflammatory myopathy with mild necrosis   From reviewing history, she was treated with very low-dose of steroid on March 14, 2020, along with methotrexate since then  She is on prednisone 10 mg daily now and also methotrexate 2.5 mg 6 tablets every week, she continue complains of gait abnormality  Update December 25, 2021 SS: Admitted 09/20/21 after a fall with rhabdomyolysis, CK over 8000, normalized to 128 at discharge.  Did a few weeks at rehab. Is back at  home. Can do some short walking with walker, mostly in wheelchair. Legs are weak, needs right knee replacement. Lives alone, has aide during the day. Friend drove her today. Still on prednisone 10 mg daily, methotrexate 15 mg weekly, wonders about decreasing methotrexate due to hair loss, dental tooth loss, concerning to her.   Labs at last visit February 2023: CK1 45, A1c 5.9, TSH 2.820  REVIEW OF SYSTEMS: Out of a complete 14 system review of symptoms, the patient complains only of the following symptoms, and all other reviewed systems are negative.  See HPI  ALLERGIES: Allergies  Allergen Reactions   Cellcept [Mycophenolate Mofetil]     Muscle cramps, mouth soreness, difficulty swallowing, bruising   Statins     Dx of in   Ciprofloxacin Hcl Other (See Comments)    Chills, sick, could not tolerate it   Hydrocodone Nausea Only   Penicillins Rash    HOME MEDICATIONS: Outpatient Medications Prior to Visit  Medication Sig Dispense Refill   acetaminophen (TYLENOL) 650 MG CR tablet Take 650 mg by mouth every 6 (six) hours.     Amino Acids-Protein Hydrolys (FEEDING SUPPLEMENT, PRO-STAT SUGAR FREE 64,) LIQD Take 30 mLs by mouth 3 (three) times daily with meals. 15-100 gram-kcal/30 mL Special Instructions: 09-22-21: albumin 2.6; may mix with water or juice     cholecalciferol (VITAMIN D3) 25 MCG (1000 UNIT) tablet Take 1 tablet (1,000 Units total) by mouth daily. 90 tablet 3   doxycycline (VIBRA-TABS) 100 MG tablet Take 1 tablet (100 mg total) by  mouth 2 (two) times daily. 14 tablet 0   ELIQUIS 5 MG TABS tablet TAKE 1 TABLET TWICE DAILY 120 tablet 3   ezetimibe (ZETIA) 10 MG tablet Take 1 tablet (10 mg total) by mouth daily. 30 tablet 0   feeding supplement, GLUCERNA SHAKE, (GLUCERNA SHAKE) LIQD Take 237 mLs by mouth 3 (three) times daily between meals.     folic acid (FOLVITE) 1 MG tablet Take 1 tablet (1 mg total) by mouth daily. 30 tablet 0   furosemide (LASIX) 20 MG tablet Take 1 tablet  (20 mg total) by mouth daily. 30 tablet 0   gabapentin (NEURONTIN) 100 MG capsule Take 1 capsule (100 mg total) by mouth daily. 2 caps = 239m; oral; At Bedtime; 08:00 PM [DX: Hereditary and idiopathic neuropathy, unspecified] 30 capsule 0   magnesium oxide (MAG-OX) 400 (240 Mg) MG tablet TAKE 1 TABLET TWICE DAILY 180 tablet 3   nystatin (MYCOSTATIN/NYSTOP) powder Apply topically 2 (two) times daily.     methotrexate 2.5 MG tablet Take 6 tablets (15 mg total) by mouth once a week. Caution:Chemotherapy. Protect from light. 24 tablet 0   predniSONE (DELTASONE) 5 MG tablet Take 2 tablets (10 mg total) by mouth daily with breakfast. 60 tablet 0   No facility-administered medications prior to visit.    PAST MEDICAL HISTORY: Past Medical History:  Diagnosis Date   Acute deep vein thrombosis (DVT) of left femoral vein ([facility] 02/07/2019   Dx 02/05/2019   Arthritis    spinal stenosis   Deep vein thrombosis (DVT) of left lower extremity ([facility] 08/04/2019   Elevated LFTs    secondary to fatty liver, negative work-up in 2011   Hypercholesterolemia    Hypertension    Lumbar spinal stenosis 01/14/2018   L3-4 and L4-5   Myopathy 02/15/2018   Osteoarthritis    right knee   Peripheral neuropathy 07/06/2018   PONV (postoperative nausea and vomiting)    Uterine cancer ([facility] 08/2006   grade 1, no recurrence up to 2013    PAST SURGICAL HISTORY: Past Surgical History:  Procedure Laterality Date   ABDOMINAL HYSTERECTOMY  2008   adenocarcinoma stage 1   APPENDECTOMY  1973   BREAST BIOPSY Left 2018   Benign   CHOLECYSTECTOMY  1973   COLONOSCOPY    06/21/2007   RNGE:XBMWUXrectum/Sigmoid diverticula, diminutive hepatic flexure polyp s/p bx. Benign.    COLONOSCOPY N/A 10/17/2012   Dr. FOneida Alar moderate diverticulosis was noted in the sigmoid colon, moderate sized internal hemorrhoids. next tcs in10 years   ESOPHAGOGASTRODUODENOSCOPY N/A 03/20/2016   Dr. FOneida Alar widely patent Schatzki ring, anemia due to  ASA induced erosive gastritis, mild duodenitis. gastric bx benign without H.pylori.    FOOT SURGERY  2007   Pins in toes on left foot, 5 to 6 yrs ago   [place] 03[place]   Procedure: GIVENS CAPSULE STUDY;  Surgeon: SDanie Binder MD;  [facility];  Service: Endoscopy;  Laterality: N/A;   MUSCLE BIOPSY Left 06/07/2018   Procedure: LEFT THIGH MUSCLE BIOPSY;  Surgeon: CErroll Luna MD;  Location: [facility]  Service: General;  Laterality: Left;   TONSILLECTOMY      FAMILY HISTORY: Family History  Problem Relation Age of Onset   Heart disease Father    Bladder Cancer Brother        in remission    Hypertension Sister    Dementia Sister    Diabetes type II Sister    Hypertension  Sister    Aneurysm Sister    Hypertension Brother    Arthritis Brother    Hypertension Son    Colon cancer Neg Hx     SOCIAL HISTORY: Social History   Socioeconomic History   Marital status: Widowed    Spouse name: Jeneen Rinks    Number of children: 3   Years of education: 12   Highest education level: 12th grade  Occupational History   Occupation: retired   Tobacco Use   Smoking status: Never   Smokeless tobacco: Never   Tobacco comments:    Never smoked  Vaping Use   Vaping Use: Never used  Substance and Sexual Activity   Alcohol use: No   Drug use: No   Sexual activity: Not Currently    Birth control/protection: Post-menopausal, Surgical  Other Topics Concern   Not on file  Social History Narrative   Husband Jeneen Rinks recently passed away   Caffeine use: soda daily   Right handed    Social Determinants of Health   Financial Resource Strain: Low Risk  (01/23/2021)   Overall Financial Resource Strain (CARDIA)    Difficulty of Paying Living Expenses: Not hard at all  Food Insecurity: No Food Insecurity (01/23/2021)   Hunger Vital Sign    Worried About Running Out of Food in the Last Year: Never true    [place] in the Last Year: Never true   Transportation Needs: No Transportation Needs (01/23/2021)   PRAPARE - Hydrologist (Medical): No    Lack of Transportation (Non-Medical): No  Physical Activity: Insufficiently Active (01/23/2021)   Exercise Vital Sign    Days of Exercise per Week: 4 days    Minutes of Exercise per Session: 30 min  Stress: No Stress Concern Present (01/23/2021)   [place]    Feeling of Stress : Not at all  Social Connections: Socially Isolated (01/23/2021)   Social Connection and Isolation Panel [NHANES]    Frequency of Communication with Friends and Family: Three times a week    Frequency of Social Gatherings with Friends and Family: Three times a week    Attends Religious Services: Never    Active Member of Clubs or Organizations: No    Attends Archivist Meetings: Never    Marital Status: Widowed  Intimate Partner Violence: Not At Risk (01/23/2021)   Humiliation, Afraid, Rape, and Kick questionnaire    Fear of Current or Ex-Partner: No    Emotionally Abused: No    Physically Abused: No    Sexually Abused: No   PHYSICAL EXAM  Vitals:   12/25/21 1303  BP: 134/77  Pulse: (!) 101   There is no height or weight on file to calculate BMI.  Generalized: Well developed, in no acute distress  Neurological examination  Mentation: Alert oriented to time, place, history taking. Follows all commands speech and language fluent Cranial nerve II-XII: Pupils were equal round reactive to light. Extraocular movements were full, visual field were full on confrontational test. Facial sensation and strength were normal. Head turning and shoulder shrug were normal and symmetric. Motor: 4/5 bilateral shoulder flexion/extension weakness, no finger open weakness, 4/5 bilateral hip flexion weakness, 3/5 knee extension/flexion weakness Sensory: Sensory testing is intact to soft touch on all 4 extremities. No evidence  of extinction is noted.  Coordination: Cerebellar testing reveals good finger-nose-finger bilaterally, difficulty lifting the legs for heel-to-shin Gait and station: In a wheelchair,  tried to stand with examiner assistance, leaned backwards into chair Reflexes: Deep tendon reflexes are symmetric   DIAGNOSTIC DATA (LABS, IMAGING, TESTING) - I reviewed patient records, labs, notes, testing and imaging myself where available.  Lab Results  Component Value Date   WBC 7.4 10/17/2021   HGB 11.3 10/17/2021   HCT 34.4 10/17/2021   MCV 93 10/17/2021   PLT 196 10/17/2021      Component Value Date/Time   NA 141 10/17/2021 1454   K 4.2 10/17/2021 1454   CL 102 10/17/2021 1454   CO2 25 10/17/2021 1454   GLUCOSE 116 (H) 10/17/2021 1454   GLUCOSE 95 10/01/2021 0400   BUN 28 (H) 10/17/2021 1454   CREATININE 0.99 10/17/2021 1454   CREATININE 0.75 12/26/2019 1156   CALCIUM 9.8 10/17/2021 1454   PROT 5.6 (L) 09/29/2021 0500   PROT 5.9 (L) 03/25/2021 1218   ALBUMIN 2.9 (L) 09/29/2021 0500   ALBUMIN 4.1 03/25/2021 1218   AST 40 09/29/2021 0500   ALT 55 (H) 09/29/2021 0500   ALKPHOS 41 09/29/2021 0500   BILITOT 0.9 09/29/2021 0500   BILITOT 0.4 03/25/2021 1218   GFRNONAA >60 10/01/2021 0400   GFRNONAA 73 12/26/2019 1156   GFRAA 91 03/13/2020 1200   GFRAA 84 12/26/2019 1156   Lab Results  Component Value Date   CHOL 195 05/01/2021   HDL 109 05/01/2021   LDLCALC 74 05/01/2021   TRIG 64 05/01/2021   CHOLHDL 1.8 05/01/2021   Lab Results  Component Value Date   HGBA1C 5.9 (H) 05/05/2021   Lab Results  Component Value Date   MSXJDBZM08 022 10/11/2019   Lab Results  Component Value Date   TSH 2.820 05/05/2021    Butler Denmark, AGNP-C, DNP 12/25/2021, 1:19 PM [facility] [address] [name], [place] [address] [phone]

## 2021-12-26 ENCOUNTER — Telehealth: Payer: Self-pay

## 2021-12-26 LAB — CBC WITH DIFFERENTIAL/PLATELET
Basophils Absolute: 0 10*3/uL (ref 0.0–0.2)
Basos: 0 %
EOS (ABSOLUTE): 0.1 10*3/uL (ref 0.0–0.4)
Eos: 2 %
Hematocrit: 37.4 % (ref 34.0–46.6)
Hemoglobin: 12.3 g/dL (ref 11.1–15.9)
Immature Grans (Abs): 0 10*3/uL (ref 0.0–0.1)
Immature Granulocytes: 0 %
Lymphocytes Absolute: 0.9 10*3/uL (ref 0.7–3.1)
Lymphs: 13 %
MCH: 30.4 pg (ref 26.6–33.0)
MCHC: 32.9 g/dL (ref 31.5–35.7)
MCV: 93 fL (ref 79–97)
Monocytes Absolute: 0.7 10*3/uL (ref 0.1–0.9)
Monocytes: 11 %
Neutrophils Absolute: 5.1 10*3/uL (ref 1.4–7.0)
Neutrophils: 74 %
Platelets: 211 10*3/uL (ref 150–450)
RBC: 4.04 x10E6/uL (ref 3.77–5.28)
RDW: 13.6 % (ref 11.7–15.4)
WBC: 6.9 10*3/uL (ref 3.4–10.8)

## 2021-12-26 LAB — COMPREHENSIVE METABOLIC PANEL
ALT: 19 IU/L (ref 0–32)
AST: 23 IU/L (ref 0–40)
Albumin/Globulin Ratio: 2.2 (ref 1.2–2.2)
Albumin: 3.9 g/dL (ref 3.7–4.7)
Alkaline Phosphatase: 50 IU/L (ref 44–121)
BUN/Creatinine Ratio: 25 (ref 12–28)
BUN: 26 mg/dL (ref 8–27)
Bilirubin Total: 0.9 mg/dL (ref 0.0–1.2)
CO2: 22 mmol/L (ref 20–29)
Calcium: 9.2 mg/dL (ref 8.7–10.3)
Chloride: 101 mmol/L (ref 96–106)
Creatinine, Ser: 1.02 mg/dL — ABNORMAL HIGH (ref 0.57–1.00)
Globulin, Total: 1.8 g/dL (ref 1.5–4.5)
Glucose: 106 mg/dL — ABNORMAL HIGH (ref 70–99)
Potassium: 3.4 mmol/L — ABNORMAL LOW (ref 3.5–5.2)
Sodium: 143 mmol/L (ref 134–144)
Total Protein: 5.7 g/dL — ABNORMAL LOW (ref 6.0–8.5)
eGFR: 53 mL/min/{1.73_m2} — ABNORMAL LOW (ref >59)

## 2021-12-26 LAB — HEMOGLOBIN A1C
Est. average glucose Bld gHb Est-mCnc: 126 mg/dL
Hgb A1c MFr Bld: 6 % — ABNORMAL HIGH (ref 4.8–5.6)

## 2021-12-26 LAB — CK: Total CK: 122 U/L (ref 26–161)

## 2021-12-26 LAB — TSH: TSH: 2.38 u[IU]/mL (ref 0.450–4.500)

## 2021-12-26 NOTE — Telephone Encounter (Signed)
Patient called have not heard anything from home health about her lower leg the blisters. Please call patient back at [phone].

## 2021-12-30 ENCOUNTER — Telehealth: Payer: Self-pay

## 2021-12-30 NOTE — Telephone Encounter (Signed)
Pt verified by name and DOB, results given per provider, pt voiced understanding all question answered. 

## 2021-12-30 NOTE — Telephone Encounter (Signed)
-----   Message from Suzzanne Cloud, NP sent at 12/28/2021  9:49 PM EDT ----- Please call, labs show mildly elevated A1C at 6.0, indicating prediabetic, PCP can follow, otherwise labs show no significant abnormalities. CK level is normal range at 122. We slightly decreased methotrexate due to potential side effects, please monitor for weakness, let me know of any change. Thanks, Judson Roch

## 2022-01-01 ENCOUNTER — Telehealth: Payer: Self-pay | Admitting: Family Medicine

## 2022-01-01 NOTE — Telephone Encounter (Signed)
Pt called stating that she is still waiting to hear from [facility] about come to help her? Can you please look into this?

## 2022-01-02 DIAGNOSIS — M5430 Sciatica, unspecified side: Secondary | ICD-10-CM | POA: Diagnosis not present

## 2022-01-02 DIAGNOSIS — I10 Essential (primary) hypertension: Secondary | ICD-10-CM | POA: Diagnosis not present

## 2022-01-02 DIAGNOSIS — M4800 Spinal stenosis, site unspecified: Secondary | ICD-10-CM | POA: Diagnosis not present

## 2022-01-02 DIAGNOSIS — R7303 Prediabetes: Secondary | ICD-10-CM | POA: Diagnosis not present

## 2022-01-02 DIAGNOSIS — L97811 Non-pressure chronic ulcer of other part of right lower leg limited to breakdown of skin: Secondary | ICD-10-CM | POA: Diagnosis not present

## 2022-01-02 DIAGNOSIS — I739 Peripheral vascular disease, unspecified: Secondary | ICD-10-CM | POA: Diagnosis not present

## 2022-01-02 DIAGNOSIS — E785 Hyperlipidemia, unspecified: Secondary | ICD-10-CM | POA: Diagnosis not present

## 2022-01-02 DIAGNOSIS — L97821 Non-pressure chronic ulcer of other part of left lower leg limited to breakdown of skin: Secondary | ICD-10-CM | POA: Diagnosis not present

## 2022-01-02 DIAGNOSIS — I872 Venous insufficiency (chronic) (peripheral): Secondary | ICD-10-CM | POA: Diagnosis not present

## 2022-01-08 ENCOUNTER — Inpatient Hospital Stay: Payer: Medicare HMO | Attending: Hematology

## 2022-01-08 DIAGNOSIS — D649 Anemia, unspecified: Secondary | ICD-10-CM | POA: Diagnosis not present

## 2022-01-08 DIAGNOSIS — Z7901 Long term (current) use of anticoagulants: Secondary | ICD-10-CM | POA: Insufficient documentation

## 2022-01-08 DIAGNOSIS — Z86718 Personal history of other venous thrombosis and embolism: Secondary | ICD-10-CM | POA: Insufficient documentation

## 2022-01-08 DIAGNOSIS — Z8542 Personal history of malignant neoplasm of other parts of uterus: Secondary | ICD-10-CM | POA: Insufficient documentation

## 2022-01-08 DIAGNOSIS — E559 Vitamin D deficiency, unspecified: Secondary | ICD-10-CM | POA: Diagnosis not present

## 2022-01-08 DIAGNOSIS — N1831 Chronic kidney disease, stage 3a: Secondary | ICD-10-CM | POA: Insufficient documentation

## 2022-01-08 LAB — COMPREHENSIVE METABOLIC PANEL
ALT: 25 U/L (ref 0–44)
AST: 28 U/L (ref 15–41)
Albumin: 3.5 g/dL (ref 3.5–5.0)
Alkaline Phosphatase: 40 U/L (ref 38–126)
Anion gap: 10 (ref 5–15)
BUN: 32 mg/dL — ABNORMAL HIGH (ref 8–23)
CO2: 27 mmol/L (ref 22–32)
Calcium: 9 mg/dL (ref 8.9–10.3)
Chloride: 104 mmol/L (ref 98–111)
Creatinine, Ser: 1.08 mg/dL — ABNORMAL HIGH (ref 0.44–1.00)
GFR, Estimated: 50 mL/min — ABNORMAL LOW (ref >60)
Glucose, Bld: 94 mg/dL (ref 70–99)
Potassium: 3.4 mmol/L — ABNORMAL LOW (ref 3.5–5.1)
Sodium: 141 mmol/L (ref 135–145)
Total Bilirubin: 1.1 mg/dL (ref 0.3–1.2)
Total Protein: 6.2 g/dL — ABNORMAL LOW (ref 6.5–8.1)

## 2022-01-08 LAB — CBC WITH DIFFERENTIAL/PLATELET
Abs Immature Granulocytes: 0.04 10*3/uL (ref 0.00–0.07)
Basophils Absolute: 0 10*3/uL (ref 0.0–0.1)
Basophils Relative: 0 %
Eosinophils Absolute: 0.1 10*3/uL (ref 0.0–0.5)
Eosinophils Relative: 2 %
HCT: 39 % (ref 36.0–46.0)
Hemoglobin: 12.1 g/dL (ref 12.0–15.0)
Immature Granulocytes: 1 %
Lymphocytes Relative: 12 %
Lymphs Abs: 0.7 10*3/uL (ref 0.7–4.0)
MCH: 30.7 pg (ref 26.0–34.0)
MCHC: 31 g/dL (ref 30.0–36.0)
MCV: 99 fL (ref 80.0–100.0)
Monocytes Absolute: 0.5 10*3/uL (ref 0.1–1.0)
Monocytes Relative: 7 %
Neutro Abs: 4.9 10*3/uL (ref 1.7–7.7)
Neutrophils Relative %: 78 %
Platelets: 206 10*3/uL (ref 150–400)
RBC: 3.94 MIL/uL (ref 3.87–5.11)
RDW: 14.5 % (ref 11.5–15.5)
WBC: 6.3 10*3/uL (ref 4.0–10.5)
nRBC: 0 % (ref 0.0–0.2)

## 2022-01-08 LAB — IRON AND TIBC
Iron: 152 ug/dL (ref 28–170)
Saturation Ratios: 48 % — ABNORMAL HIGH (ref 10.4–31.8)
TIBC: 315 ug/dL (ref 250–450)
UIBC: 163 ug/dL

## 2022-01-08 LAB — VITAMIN B12: Vitamin B-12: 412 pg/mL (ref 180–914)

## 2022-01-08 LAB — VITAMIN D 25 HYDROXY (VIT D DEFICIENCY, FRACTURES): Vit D, 25-Hydroxy: 33.65 ng/mL (ref 30–100)

## 2022-01-08 LAB — FERRITIN: Ferritin: 40 ng/mL (ref 11–307)

## 2022-01-09 DIAGNOSIS — R7303 Prediabetes: Secondary | ICD-10-CM | POA: Diagnosis not present

## 2022-01-09 DIAGNOSIS — I10 Essential (primary) hypertension: Secondary | ICD-10-CM | POA: Diagnosis not present

## 2022-01-09 DIAGNOSIS — Z7901 Long term (current) use of anticoagulants: Secondary | ICD-10-CM

## 2022-01-09 DIAGNOSIS — Z7952 Long term (current) use of systemic steroids: Secondary | ICD-10-CM

## 2022-01-09 DIAGNOSIS — Z79631 Long term (current) use of antimetabolite agent: Secondary | ICD-10-CM

## 2022-01-09 DIAGNOSIS — M4800 Spinal stenosis, site unspecified: Secondary | ICD-10-CM | POA: Diagnosis not present

## 2022-01-09 DIAGNOSIS — Z9181 History of falling: Secondary | ICD-10-CM

## 2022-01-09 DIAGNOSIS — E785 Hyperlipidemia, unspecified: Secondary | ICD-10-CM | POA: Diagnosis not present

## 2022-01-09 DIAGNOSIS — I739 Peripheral vascular disease, unspecified: Secondary | ICD-10-CM | POA: Diagnosis not present

## 2022-01-09 DIAGNOSIS — M5430 Sciatica, unspecified side: Secondary | ICD-10-CM | POA: Diagnosis not present

## 2022-01-09 DIAGNOSIS — I872 Venous insufficiency (chronic) (peripheral): Secondary | ICD-10-CM | POA: Diagnosis not present

## 2022-01-09 DIAGNOSIS — L97821 Non-pressure chronic ulcer of other part of left lower leg limited to breakdown of skin: Secondary | ICD-10-CM | POA: Diagnosis not present

## 2022-01-09 DIAGNOSIS — L97811 Non-pressure chronic ulcer of other part of right lower leg limited to breakdown of skin: Secondary | ICD-10-CM | POA: Diagnosis not present

## 2022-01-09 LAB — KAPPA/LAMBDA LIGHT CHAINS
Kappa free light chain: 30.8 mg/L — ABNORMAL HIGH (ref 3.3–19.4)
Kappa, lambda light chain ratio: 2.07 — ABNORMAL HIGH (ref 0.26–1.65)
Lambda free light chains: 14.9 mg/L (ref 5.7–26.3)

## 2022-01-11 LAB — METHYLMALONIC ACID, SERUM: Methylmalonic Acid, Quantitative: 278 nmol/L (ref 0–378)

## 2022-01-13 DIAGNOSIS — H25811 Combined forms of age-related cataract, right eye: Secondary | ICD-10-CM | POA: Diagnosis not present

## 2022-01-13 DIAGNOSIS — H25812 Combined forms of age-related cataract, left eye: Secondary | ICD-10-CM | POA: Diagnosis not present

## 2022-01-13 DIAGNOSIS — H353221 Exudative age-related macular degeneration, left eye, with active choroidal neovascularization: Secondary | ICD-10-CM | POA: Diagnosis not present

## 2022-01-13 DIAGNOSIS — H25813 Combined forms of age-related cataract, bilateral: Secondary | ICD-10-CM | POA: Diagnosis not present

## 2022-01-13 LAB — PROTEIN ELECTROPHORESIS, SERUM
A/G Ratio: 1.6 (ref 0.7–1.7)
Albumin ELP: 3.6 g/dL (ref 2.9–4.4)
Alpha-1-Globulin: 0.2 g/dL (ref 0.0–0.4)
Alpha-2-Globulin: 0.7 g/dL (ref 0.4–1.0)
Beta Globulin: 0.8 g/dL (ref 0.7–1.3)
Gamma Globulin: 0.5 g/dL (ref 0.4–1.8)
Globulin, Total: 2.2 g/dL (ref 2.2–3.9)
Total Protein ELP: 5.8 g/dL — ABNORMAL LOW (ref 6.0–8.5)

## 2022-01-15 ENCOUNTER — Inpatient Hospital Stay (HOSPITAL_BASED_OUTPATIENT_CLINIC_OR_DEPARTMENT_OTHER): Payer: Medicare HMO | Admitting: Physician Assistant

## 2022-01-15 ENCOUNTER — Encounter: Payer: Self-pay | Admitting: Physician Assistant

## 2022-01-15 VITALS — Wt 163.0 lb

## 2022-01-15 DIAGNOSIS — I82532 Chronic embolism and thrombosis of left popliteal vein: Secondary | ICD-10-CM | POA: Diagnosis not present

## 2022-01-15 DIAGNOSIS — D649 Anemia, unspecified: Secondary | ICD-10-CM | POA: Diagnosis not present

## 2022-01-15 NOTE — Progress Notes (Signed)
Virtual Visit via Telephone Note [facility]  I connected with [name] [name]  on 01/15/2022 at 10:45 AM by telephone and verified that I am speaking with the correct person using two identifiers.  Location: Patient: Home Provider: [facility]   I discussed the limitations, risks, security and privacy concerns of performing an evaluation and management service by telephone and the availability of in person appointments. I also discussed with the patient that there may be a patient responsible charge related to this service. The patient expressed understanding and agreed to proceed.  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  She was last seen by Tarri Abernethy PA-C on 10/14/2021.  At today's visit, she reports feeling fairly well.  She remains on Eliquis, which she is tolerating well without major bleeding events.  She has not had any recurrence of her maroon-colored stools since they occurred in July 2022. She denies any current symptoms of epistaxis, hematemesis, hematochezia, or melena.   She continues to have chronic bilateral leg swelling, with left greater than right likely due to post thrombotic changes.   She denies any shortness of breath, dyspnea on exertion, chest pain, cough, hemoptysis, and palpitations.  Regarding her history of endometrial cancer, she has not noticed any recent vaginal bleeding, abdominal pain, or B symptoms.   She has chronic fatigue, with energy at baseline of about 25%. She has 75% appetite and reports that she feels she is maintaining a stable weight.    OBSERVATIONS/OBJECTIVE: Review of Systems  Constitutional:  Positive for malaise/fatigue. Negative for chills, diaphoresis, fever and  weight loss.  Respiratory:  Negative for cough and shortness of breath.   Cardiovascular:  Positive for leg swelling. Negative for chest pain and palpitations.  Gastrointestinal:  Negative for abdominal pain, blood in stool, melena, nausea and vomiting.  Neurological:  Positive for dizziness. Negative for headaches.     PHYSICAL EXAM (per limitations of virtual telephone visit): The patient is alert and oriented x 3, exhibiting adequate mentation, good mood, and ability to speak in full sentences and execute sound judgement.   ASSESSMENT & PLAN: 1.  Normocytic anemia - Intermittent normocytic anemia, mild, likely anemia of chronic disease  - Hematology work-up (01/08/2022): Creatinine 1.08/GFR 50 (CKD stage IIIa) SPEP negative for M spike.  Mildly elevated kappa free light chain 30.8 with normal lambda and mildly elevated ratio 2.07. Normal copper, B12, MMA Ferritin 40, iron saturation 48% - Most recent CBC (01/08/2022) shows normalization of anemia with Hgb 12.1/MCV 99.0 - Patient was instructed to stop taking iron tablet by her PCP  - No bright red blood per rectum or melena.    - PLAN: We will monitor with repeat CBC and iron panel at next visit in 6 months.  2.  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 - Repeat ultrasound on 08/05/2019 showed occlusive DVT involving left popliteal vein with extension to left posterior tibial vein and one of the paired left peroneal veins similar to 02/10/2019 imaging study - Ultrasound on 08/23/2019 showed no evidence  of acute or chronic DVT within either lower extremity, with special attention to left popliteal and tibial veins.  There was noted to be a 16.6 x 3.9 x 3.5 cm complex fluid collection  extending from the left popliteal fossa to the proximal mid aspect of the left calf, which was not seen on 08/05/2019 (this was found to be hemarthrosis of the left knee joint, s/p aspiration and Depo-Medrol injection by Dr. Aline Brochure on 09/08/2019 with 40 cc of aspirated blood) - Due to hemarthrosis described above, Eliquis was discontinued again on 09/05/2019 - Bilateral lower extremity ultrasound on 09/18/2019 was negative for DVT - Bilateral venous ultrasound (11/13/2019): New partially occlusive thrombus in the left popliteal vein - Patient restarted on Eliquis 11/13/2019 - Left lower extremity venous US (03/08/2020): Resolution of left lower extremity DVT - Most recent D-dimer (10/07/2021): Mildly elevated at 0.76, but no current symptoms of recurrent DVT     - At this juncture, would recommend lifelong anticoagulation, as patient has had recurrent left lower extremity DVTs each time that Eliquis has been discontinued - Patient currently taking Eliquis 5 mg twice daily, tolerating well without any symptoms of blood loss.  She did report some maroon-colored stools in July 2022, but these resolved on their own and have not recurred.   - PLAN: Continue Eliquis indefinitely.  Repeat D-dimer and CBC at follow-up.  Patient educated on alarm symptoms that would prompt immediate medical attention.   3.  Vitamin D deficiency - She is taking vitamin D 1000 units daily - Most recent vitamin D (01/08/2022) normal at 33.65 - PLAN: Recommend continuing vitamin D 1000 units daily supplement   - Repeat vitamin D level annually if not checked by PCP   4.  History of endometrial adenocarcinoma - She had T1b N0 well-differentiated endometrioid adenocarcinoma, FIGO grade 1, TAH/BSO with lymphadenectomy done on 12/14/2006.  She did not require any adjuvant therapy. CT scan on 03/06/2019 did not show any evidence of recurrence or metastatic disease - Denies vaginal bleeding, abdominal pain, B symptoms, unintentional  weight loss     FOLLOW UP INSTRUCTIONS: Labs and office visit in 6 months    I discussed the assessment and treatment plan with the patient. The patient was provided an opportunity to ask questions and all were answered. The patient agreed with the plan and demonstrated an understanding of the instructions.   The patient was advised to call back or seek an in-person evaluation if the symptoms worsen or if the condition fails to improve as anticipated.  I provided 22 minutes of non-face-to-face time during this encounter.   Harriett Rush, PA-C 01/15/22 1:03 PM

## 2022-01-16 ENCOUNTER — Ambulatory Visit (INDEPENDENT_AMBULATORY_CARE_PROVIDER_SITE_OTHER): Payer: Medicare HMO | Admitting: Family Medicine

## 2022-01-16 ENCOUNTER — Encounter: Payer: Self-pay | Admitting: Family Medicine

## 2022-01-16 VITALS — BP 127/78 | HR 83 | Ht 65.0 in | Wt 163.0 lb

## 2022-01-16 DIAGNOSIS — I825Z2 Chronic embolism and thrombosis of unspecified deep veins of left distal lower extremity: Secondary | ICD-10-CM

## 2022-01-16 DIAGNOSIS — M5430 Sciatica, unspecified side: Secondary | ICD-10-CM | POA: Diagnosis not present

## 2022-01-16 DIAGNOSIS — Z7952 Long term (current) use of systemic steroids: Secondary | ICD-10-CM

## 2022-01-16 DIAGNOSIS — M4800 Spinal stenosis, site unspecified: Secondary | ICD-10-CM | POA: Diagnosis not present

## 2022-01-16 DIAGNOSIS — Z7901 Long term (current) use of anticoagulants: Secondary | ICD-10-CM

## 2022-01-16 DIAGNOSIS — R7303 Prediabetes: Secondary | ICD-10-CM | POA: Diagnosis not present

## 2022-01-16 DIAGNOSIS — Z9181 History of falling: Secondary | ICD-10-CM

## 2022-01-16 DIAGNOSIS — I350 Nonrheumatic aortic (valve) stenosis: Secondary | ICD-10-CM

## 2022-01-16 DIAGNOSIS — I872 Venous insufficiency (chronic) (peripheral): Secondary | ICD-10-CM | POA: Diagnosis not present

## 2022-01-16 DIAGNOSIS — I10 Essential (primary) hypertension: Secondary | ICD-10-CM | POA: Diagnosis not present

## 2022-01-16 DIAGNOSIS — Z01810 Encounter for preprocedural cardiovascular examination: Secondary | ICD-10-CM | POA: Diagnosis not present

## 2022-01-16 DIAGNOSIS — E785 Hyperlipidemia, unspecified: Secondary | ICD-10-CM | POA: Diagnosis not present

## 2022-01-16 DIAGNOSIS — I340 Nonrheumatic mitral (valve) insufficiency: Secondary | ICD-10-CM | POA: Diagnosis not present

## 2022-01-16 DIAGNOSIS — I5189 Other ill-defined heart diseases: Secondary | ICD-10-CM | POA: Diagnosis not present

## 2022-01-16 DIAGNOSIS — I739 Peripheral vascular disease, unspecified: Secondary | ICD-10-CM | POA: Diagnosis not present

## 2022-01-16 DIAGNOSIS — L97811 Non-pressure chronic ulcer of other part of right lower leg limited to breakdown of skin: Secondary | ICD-10-CM | POA: Diagnosis not present

## 2022-01-16 DIAGNOSIS — Z79631 Long term (current) use of antimetabolite agent: Secondary | ICD-10-CM

## 2022-01-16 DIAGNOSIS — L97821 Non-pressure chronic ulcer of other part of left lower leg limited to breakdown of skin: Secondary | ICD-10-CM | POA: Diagnosis not present

## 2022-01-16 NOTE — Patient Instructions (Signed)
F/U as before , call if you need me sooner  You are referred for cardiology evaluation since you are scheduled for eye surgery and your murmur needs  to be checked before medical clearance is complete  Thanks for choosing [facility], we consider it a privelige to serve you.

## 2022-01-18 ENCOUNTER — Encounter: Payer: Self-pay | Admitting: Family Medicine

## 2022-01-18 DIAGNOSIS — I5189 Other ill-defined heart diseases: Secondary | ICD-10-CM | POA: Insufficient documentation

## 2022-01-18 LAB — COPPER, SERUM: Copper: 104 ug/dL (ref 80–158)

## 2022-01-18 NOTE — Assessment & Plan Note (Signed)
Home safety reviewed

## 2022-01-18 NOTE — Assessment & Plan Note (Signed)
Cardiology evaluation and clearance requested prior to cataract surgery as this is a new dx

## 2022-01-18 NOTE — Assessment & Plan Note (Signed)
Controlled, no change in medication  

## 2022-01-18 NOTE — Assessment & Plan Note (Signed)
Referred to Cardiologyfor clearance prior to cataract surgery as this is a new diagnosis

## 2022-01-18 NOTE — Assessment & Plan Note (Signed)
No current s/s of decompensation , stable

## 2022-01-18 NOTE — Progress Notes (Signed)
   [name] [name]     MRN: [id number]      DOB: 1932-11-07   HPI [name] [name] is here for for medical clearance for cataract surgery. She has actually been sent back from ophthalmology because of a heart murmur, which has not been previously addressed, and of which she was unaware Record review reveals that she does have valvular disease per echocardiogram in 09/2021 ROS Denies recent fever or chills. Denies sinus pressure, nasal congestion, ear pain or sore throat. Denies chest congestion, productive cough or wheezing. Denies chest pains, palpitations has chronic leg swelling Denies abdominal pain, nausea, vomiting,diarrhea or constipation.   Denies dysuria, frequency, hesitancy  chronic  incontinence. Chronic joint pain, swelling and limitation in mobility. Denies headaches,  Mild but controlled  depression and  anxiety due to life stresses, denies  insomnia. Open skin on both legs, currently being managed by H/H with wraps in place PE  BP 127/78 (BP Location: Right Arm, Patient Position: Sitting, Cuff Size: Large)   Pulse 83   Ht 5' 5"  (1.651 m)   Wt 163 lb (73.9 kg)   SpO2 98%   BMI 27.12 kg/m   Patient alert and oriented and in no cardiopulmonary distress.  HEENT: No facial asymmetry, EOMI,     Neck supple .  Chest: Clear to auscultation bilaterally.  CVS: S1, S2 no murmurs, no S3.Regular rate.  ABD: Soft non tender.   Ext: No edema  MS: Markedly reduced ROM spine, shoulders, hips and knees.  Skin: Intact, no ulcerations or rash noted.  Psych: Good eye contact, normal affect. Memory intact not anxious or depressed appearing.  CNS: CN 2-12 intact, power,  normal throughout.no focal deficits noted.   Assessment & Plan  Aortic stenosis Referred to Cardiologyfor clearance prior to cataract surgery as this is a new diagnosis   Mitral valve regurgitation Cardiology evaluation and clearance requested prior to cataract surgery as this is a new dx  Diastolic  dysfunction No current s/s of decompensation , stable  Essential hypertension Controlled, no change in medication   Chronic deep vein thrombosis (DVT) of distal vein of left lower extremity ([place]) Currently  Maintained on eliquis  At high risk for falls per [facility] fall risk assessment scale Home safety reviewed

## 2022-01-18 NOTE — Assessment & Plan Note (Signed)
Currently  Maintained on eliquis

## 2022-01-19 ENCOUNTER — Telehealth: Payer: Self-pay | Admitting: Family Medicine

## 2022-01-19 NOTE — Telephone Encounter (Signed)
Spoke with emily at [facility] she is aware cardiac clearance is needed

## 2022-01-19 NOTE — Telephone Encounter (Signed)
Romie Minus [place], [phone]   Called wanting update on the medical clearance form?

## 2022-01-19 NOTE — Telephone Encounter (Signed)
Pt called stating [facility] in [place] can't see her till January 2024 for her surgical clearance. She has an appt for [facility] in [place] for 02/11/22. She can't drive to [place]. Wants to know if you have a way to get her seen sooner at [facility]?

## 2022-01-20 ENCOUNTER — Telehealth: Payer: Self-pay | Admitting: Family Medicine

## 2022-01-20 NOTE — Telephone Encounter (Signed)
Direct contact made to express condolence and support at this time of her loss of son, I will need to reach out to opthalmoplogy to delay surgery pending cardiology eval

## 2022-01-21 DIAGNOSIS — L97821 Non-pressure chronic ulcer of other part of left lower leg limited to breakdown of skin: Secondary | ICD-10-CM | POA: Diagnosis not present

## 2022-01-21 DIAGNOSIS — Z7952 Long term (current) use of systemic steroids: Secondary | ICD-10-CM

## 2022-01-21 DIAGNOSIS — Z9181 History of falling: Secondary | ICD-10-CM

## 2022-01-21 DIAGNOSIS — M4800 Spinal stenosis, site unspecified: Secondary | ICD-10-CM | POA: Diagnosis not present

## 2022-01-21 DIAGNOSIS — M5430 Sciatica, unspecified side: Secondary | ICD-10-CM | POA: Diagnosis not present

## 2022-01-21 DIAGNOSIS — I739 Peripheral vascular disease, unspecified: Secondary | ICD-10-CM | POA: Diagnosis not present

## 2022-01-21 DIAGNOSIS — I872 Venous insufficiency (chronic) (peripheral): Secondary | ICD-10-CM | POA: Diagnosis not present

## 2022-01-21 DIAGNOSIS — I10 Essential (primary) hypertension: Secondary | ICD-10-CM | POA: Diagnosis not present

## 2022-01-21 DIAGNOSIS — Z79631 Long term (current) use of antimetabolite agent: Secondary | ICD-10-CM

## 2022-01-21 DIAGNOSIS — L97811 Non-pressure chronic ulcer of other part of right lower leg limited to breakdown of skin: Secondary | ICD-10-CM | POA: Diagnosis not present

## 2022-01-21 DIAGNOSIS — E785 Hyperlipidemia, unspecified: Secondary | ICD-10-CM | POA: Diagnosis not present

## 2022-01-21 DIAGNOSIS — Z7901 Long term (current) use of anticoagulants: Secondary | ICD-10-CM

## 2022-01-21 DIAGNOSIS — R7303 Prediabetes: Secondary | ICD-10-CM | POA: Diagnosis not present

## 2022-01-29 DIAGNOSIS — Z9181 History of falling: Secondary | ICD-10-CM

## 2022-01-29 DIAGNOSIS — I872 Venous insufficiency (chronic) (peripheral): Secondary | ICD-10-CM | POA: Diagnosis not present

## 2022-01-29 DIAGNOSIS — R7303 Prediabetes: Secondary | ICD-10-CM | POA: Diagnosis not present

## 2022-01-29 DIAGNOSIS — I739 Peripheral vascular disease, unspecified: Secondary | ICD-10-CM | POA: Diagnosis not present

## 2022-01-29 DIAGNOSIS — L97811 Non-pressure chronic ulcer of other part of right lower leg limited to breakdown of skin: Secondary | ICD-10-CM | POA: Diagnosis not present

## 2022-01-29 DIAGNOSIS — M5430 Sciatica, unspecified side: Secondary | ICD-10-CM | POA: Diagnosis not present

## 2022-01-29 DIAGNOSIS — Z79631 Long term (current) use of antimetabolite agent: Secondary | ICD-10-CM

## 2022-01-29 DIAGNOSIS — I10 Essential (primary) hypertension: Secondary | ICD-10-CM | POA: Diagnosis not present

## 2022-01-29 DIAGNOSIS — E785 Hyperlipidemia, unspecified: Secondary | ICD-10-CM | POA: Diagnosis not present

## 2022-01-29 DIAGNOSIS — Z7901 Long term (current) use of anticoagulants: Secondary | ICD-10-CM

## 2022-01-29 DIAGNOSIS — Z7952 Long term (current) use of systemic steroids: Secondary | ICD-10-CM

## 2022-01-29 DIAGNOSIS — L97821 Non-pressure chronic ulcer of other part of left lower leg limited to breakdown of skin: Secondary | ICD-10-CM | POA: Diagnosis not present

## 2022-01-29 DIAGNOSIS — M4800 Spinal stenosis, site unspecified: Secondary | ICD-10-CM | POA: Diagnosis not present

## 2022-02-02 DIAGNOSIS — M4800 Spinal stenosis, site unspecified: Secondary | ICD-10-CM | POA: Diagnosis not present

## 2022-02-02 DIAGNOSIS — I872 Venous insufficiency (chronic) (peripheral): Secondary | ICD-10-CM | POA: Diagnosis not present

## 2022-02-02 DIAGNOSIS — I10 Essential (primary) hypertension: Secondary | ICD-10-CM | POA: Diagnosis not present

## 2022-02-02 DIAGNOSIS — L97821 Non-pressure chronic ulcer of other part of left lower leg limited to breakdown of skin: Secondary | ICD-10-CM | POA: Diagnosis not present

## 2022-02-02 DIAGNOSIS — M5430 Sciatica, unspecified side: Secondary | ICD-10-CM | POA: Diagnosis not present

## 2022-02-02 DIAGNOSIS — R7303 Prediabetes: Secondary | ICD-10-CM | POA: Diagnosis not present

## 2022-02-02 DIAGNOSIS — E785 Hyperlipidemia, unspecified: Secondary | ICD-10-CM | POA: Diagnosis not present

## 2022-02-02 DIAGNOSIS — I739 Peripheral vascular disease, unspecified: Secondary | ICD-10-CM | POA: Diagnosis not present

## 2022-02-02 DIAGNOSIS — L97811 Non-pressure chronic ulcer of other part of right lower leg limited to breakdown of skin: Secondary | ICD-10-CM | POA: Diagnosis not present

## 2022-02-09 ENCOUNTER — Telehealth: Payer: Self-pay | Admitting: Family Medicine

## 2022-02-09 NOTE — Telephone Encounter (Signed)
  Prescription Request  02/09/2022  Is this a "Controlled Substance" medicine? No  LOV: 01/20/2022   What is the name of the medication or equipment? triamterene-hydrochlorothiazide (MAXZIDE) 75-50 MG per tablet   Have you contacted your pharmacy to request a refill? Yes   Which pharmacy would you like this sent to?  [place], [place] - [address] [place] [address] Phone: [phone] Fax: [phone]  [facility] - [place] [place] [address] Phone: [phone] Fax: [phone]   Patient notified that their request is being sent to the clinical staff for review and that they should receive a response within 2 business days.   Please advise at [phone] (mobile)

## 2022-02-11 ENCOUNTER — Telehealth: Payer: Self-pay | Admitting: Family Medicine

## 2022-02-11 ENCOUNTER — Ambulatory Visit: Payer: Medicare HMO | Attending: Interventional Cardiology | Admitting: Interventional Cardiology

## 2022-02-11 VITALS — BP 98/68 | HR 88 | Ht 65.0 in | Wt 163.0 lb

## 2022-02-11 DIAGNOSIS — I350 Nonrheumatic aortic (valve) stenosis: Secondary | ICD-10-CM | POA: Diagnosis not present

## 2022-02-11 DIAGNOSIS — Z7901 Long term (current) use of anticoagulants: Secondary | ICD-10-CM

## 2022-02-11 DIAGNOSIS — Z01810 Encounter for preprocedural cardiovascular examination: Secondary | ICD-10-CM

## 2022-02-11 NOTE — Telephone Encounter (Signed)
Per last msg and Dr Moshe Cipro medication was discontinued patient aware.

## 2022-02-11 NOTE — Telephone Encounter (Signed)
Patient aware.

## 2022-02-11 NOTE — Telephone Encounter (Signed)
[name] w. [facility] called in on patient behalf.  Requesting new script for   triamterene-hydrochlorothiazide (MAXZIDE-25) 37.5-25 MG tablet    Call back # [phone]  Fax [phone]

## 2022-02-11 NOTE — Patient Instructions (Signed)
Medication Instructions:  Your physician recommends that you continue on your current medications as directed. Please refer to the Current Medication list given to you today.  *If you need a refill on your cardiac medications before your next appointment, please call your pharmacy*   Lab Work: none If you have labs (blood work) drawn today and your tests are completely normal, you will receive your results only by: [place] (if you have MyChart) OR A paper copy in the mail If you have any lab test that is abnormal or we need to change your treatment, we will call you to review the results.   Testing/Procedures: none   Follow-Up: At [facility], you and your health needs are our priority.  As part of our continuing mission to provide you with exceptional heart care, we have created designated Provider Care Teams.  These Care Teams include your primary Cardiologist (physician) and Advanced Practice Providers (APPs -  Physician Assistants and Nurse Practitioners) who all work together to provide you with the care you need, when you need it.  We recommend signing up for the patient portal called "MyChart".  Sign up information is provided on this After Visit Summary.  MyChart is used to connect with patients for Virtual Visits (Telemedicine).  Patients are able to view lab/test results, encounter notes, upcoming appointments, etc.  Non-urgent messages can be sent to your provider as well.   To learn more about what you can do with MyChart, go to [email or web address].    Your next appointment:   12 month(s)  The format for your next appointment:   In Person  Provider:   Larae Grooms, MD     Other Instructions    Important Information About Sugar

## 2022-02-11 NOTE — Progress Notes (Signed)
Cardiology Office Note   Date:  02/11/2022   ID:  [name] [name], DOB 12/14/1932, MRN [id number]  PCP:  Fayrene Helper, MD    No chief complaint on file.  Aortic stenosis  Wt Readings from Last 3 Encounters:  02/11/22 163 lb (73.9 kg)  01/16/22 163 lb (73.9 kg)  01/15/22 163 lb (73.9 kg)       History of Present Illness: [name] [name] is a 88 y.o. female who is being seen today for the evaluation of aortic stenosis at the request of Fayrene Helper, MD.   Echo from July 2023 showed: "Left ventricular ejection fraction, by estimation, is 55%. The left  ventricle has normal function. The left ventricle has no regional wall  motion abnormalities. Left ventricular diastolic parameters are consistent  with Grade I diastolic dysfunction  (impaired relaxation).   2. Right ventricular systolic function is normal. The right ventricular  size is normal. There is normal pulmonary artery systolic pressure.   3. Left atrial size was mildly dilated.   4. The mitral valve is abnormal. Trivial mitral valve regurgitation. No  evidence of mitral stenosis.   5. Calcified mild stenosis by gradients but DVI/AVA more moderate . The  aortic valve is tricuspid. There is moderate calcification of the aortic  valve. There is moderate thickening of the aortic valve. Aortic valve  regurgitation is not visualized.  Moderate aortic valve stenosis.   6. The inferior vena cava is normal in size with greater than 50%  respiratory variability, suggesting right atrial pressure of 3 mmHg. "  Basically wheelchair bound due to blood clot, and sciatica for the past 2-3 years.  Some dizziness when going from lying to sitting.  Denies : Chest pain.  Nitroglycerin use. Orthopnea. Palpitations. Paroxysmal nocturnal dyspnea. Shortness of breath. Syncope.    Past Medical History:  Diagnosis Date   Acute deep vein thrombosis (DVT) of left femoral vein ([place]) 02/07/2019   Dx 02/05/2019   Arthritis     spinal stenosis   Deep vein thrombosis (DVT) of left lower extremity ([place]) 08/04/2019   Elevated LFTs    secondary to fatty liver, negative work-up in 2011   Hypercholesterolemia    Hypertension    Lumbar spinal stenosis 01/14/2018   L3-4 and L4-5   Myopathy 02/15/2018   Osteoarthritis    right knee   Peripheral neuropathy 07/06/2018   PONV (postoperative nausea and vomiting)    Uterine cancer ([place]) 08/2006   grade 1, no recurrence up to 2013    Past Surgical History:  Procedure Laterality Date   ABDOMINAL HYSTERECTOMY  2008   adenocarcinoma stage 1   APPENDECTOMY  1973   BREAST BIOPSY Left 2018   Benign   CHOLECYSTECTOMY  1973   COLONOSCOPY    06/21/2007   AJO:INOMVE rectum/Sigmoid diverticula, diminutive hepatic flexure polyp s/p bx. Benign.    COLONOSCOPY N/A 10/17/2012   Dr. Oneida Alar- moderate diverticulosis was noted in the sigmoid colon, moderate sized internal hemorrhoids. next tcs in10 years   ESOPHAGOGASTRODUODENOSCOPY N/A 03/20/2016   Dr. Oneida Alar, widely patent Schatzki ring, anemia due to ASA induced erosive gastritis, mild duodenitis. gastric bx benign without H.pylori.    FOOT SURGERY  2007   Pins in toes on left foot, 5 to 6 yrs ago   [place] N/A 03/20/2016   Procedure: GIVENS CAPSULE STUDY;  Surgeon: Danie Binder, MD;  Location: [facility];  Service: Endoscopy;  Laterality: N/A;   MUSCLE BIOPSY  Left 06/07/2018   Procedure: LEFT THIGH MUSCLE BIOPSY;  Surgeon: Erroll Luna, MD;  Location: [facility];  Service: General;  Laterality: Left;   TONSILLECTOMY       Current Outpatient Medications  Medication Sig Dispense Refill   acetaminophen (TYLENOL) 650 MG CR tablet Take 650 mg by mouth every 6 (six) hours.     cholecalciferol (VITAMIN D3) 25 MCG (1000 UNIT) tablet Take 1 tablet (1,000 Units total) by mouth daily. 90 tablet 3   ELIQUIS 5 MG TABS tablet TAKE 1 TABLET TWICE DAILY 120 tablet 3   ezetimibe (ZETIA) 10 MG tablet Take 1  tablet (10 mg total) by mouth daily. 30 tablet 0   folic acid (FOLVITE) 1 MG tablet Take 1 tablet (1 mg total) by mouth daily. 30 tablet 0   gabapentin (NEURONTIN) 100 MG capsule Take 1 capsule (100 mg total) by mouth daily. 2 caps = 280m; oral; At Bedtime; 08:00 PM [DX: Hereditary and idiopathic neuropathy, unspecified] 30 capsule 0   magnesium oxide (MAG-OX) 400 (240 Mg) MG tablet TAKE 1 TABLET TWICE DAILY 180 tablet 3   methotrexate 2.5 MG tablet Take 5 tablets (12.5 mg total) by mouth once a week. Caution:Chemotherapy. Protect from light. 20 tablet 11   nystatin (MYCOSTATIN/NYSTOP) powder Apply topically 2 (two) times daily.     predniSONE (DELTASONE) 5 MG tablet Take 2 tablets (10 mg total) by mouth daily with breakfast. 60 tablet 5   Amino Acids-Protein Hydrolys (FEEDING SUPPLEMENT, PRO-STAT SUGAR FREE 64,) LIQD Take 30 mLs by mouth 3 (three) times daily with meals. 15-100 gram-kcal/30 mL Special Instructions: 09-22-21: albumin 2.6; may mix with water or juice (Patient not taking: Reported on 02/11/2022)     feeding supplement, GLUCERNA SHAKE, (GLUCERNA SHAKE) LIQD Take 237 mLs by mouth 3 (three) times daily between meals. (Patient not taking: Reported on 02/11/2022)     furosemide (LASIX) 20 MG tablet Take 1 tablet (20 mg total) by mouth daily. (Patient not taking: Reported on 02/11/2022) 30 tablet 0   No current facility-administered medications for this visit.    Allergies:   Cellcept [mycophenolate mofetil], Statins, Ciprofloxacin hcl, Hydrocodone, and Penicillins    Social History:  The patient  reports that she has never smoked. She has never used smokeless tobacco. She reports that she does not drink alcohol and does not use drugs.   Family History:  The patient's family history includes Aneurysm in her sister; Arthritis in her brother; Bladder Cancer in her brother; Dementia in her sister; Diabetes type II in her sister; Heart disease in her father; Hypertension in her brother,  sister, sister, and son.    ROS:  Please see the history of present illness.   Otherwise, review of systems are positive for leg weakness.   All other systems are reviewed and negative.    PHYSICAL EXAM: VS:  BP 98/68   Pulse 88   Ht 5' 5"  (1.651 m)   Wt 163 lb (73.9 kg)   SpO2 97%   BMI 27.12 kg/m  , BMI Body mass index is 27.12 kg/m. GEN: Well nourished, well developed, in no acute distress HEENT: normal Neck: no JVD, carotid bruits, or masses Cardiac: RRR, premature; 2/6 systolc murmur, no rubs, or gallops  Respiratory:  clear to auscultation bilaterally, normal work of breathing GI: soft, nontender, nondistended, + BS MS: legs wrapped Skin: warm and dry, no rash Neuro:  Strength and sensation are intact Psych: euthymic mood, full affect   EKG:  The ekg ordered today demonstrates NSR, LVH, PACs   Recent Labs: 10/01/2021: Magnesium 2.1 12/25/2021: TSH 2.380 01/08/2022: ALT 25; BUN 32; Creatinine, Ser 1.08; Hemoglobin 12.1; Platelets 206; Potassium 3.4; Sodium 141   Lipid Panel    Component Value Date/Time   CHOL 195 05/01/2021 1104   TRIG 64 05/01/2021 1104   HDL 109 05/01/2021 1104   CHOLHDL 1.8 05/01/2021 1104   CHOLHDL 2.1 05/30/2020 1059   VLDL 18 05/30/2020 1059   LDLCALC 74 05/01/2021 1104   LDLCALC 106 (H) 01/04/2019 1317     Other studies Reviewed: Additional studies/ records that were reviewed today with results demonstrating: LDL 74.   ASSESSMENT AND PLAN:  Aortic stenosis: Mild to moderate.  No syncope.  Basically wheelchair bound due to blood clot, and sciatica.  No symptoms from the aortic stenosis.  Will follow.  Depending on if her activity level improves down the road, she may be a candidate for intervention if the aortic stenosis became severe.  Mentally, she seems quite sharp.  Hopefully her leg issues will improve. Anticoagulated: Prior DVT.  Taking Eliquis.  DVT returned after Eliquis stopped so she went back on the blood  thinner. Preoperative cardiovascular exam: Needs cataract surgery.  Cataract surgery is low risk from a cardiac standpoint.  Normal LV function.  No further cardiac testing needed prior to cataract surgery.   Current medicines are reviewed at length with the patient today.  The patient concerns regarding her medicines were addressed.  The following changes have been made:  No change  Labs/ tests ordered today include:  No orders of the defined types were placed in this encounter.   Recommend 150 minutes/week of aerobic exercise Low fat, low carb, high fiber diet recommended  Disposition:   FU in 1 year   Signed, Larae Grooms, MD  02/11/2022 3:12 PM    [facility] [place], [place], [place]  [address] Phone: [phone]; Fax: [phone]

## 2022-02-16 ENCOUNTER — Telehealth: Payer: Self-pay | Admitting: Family Medicine

## 2022-02-16 NOTE — Telephone Encounter (Signed)
Inez Catalina called  from [facility].patient has a wound on right great toe what does the nurse need to do for this?  [phone] call back #

## 2022-02-17 ENCOUNTER — Other Ambulatory Visit: Payer: Self-pay

## 2022-02-17 ENCOUNTER — Telehealth: Payer: Self-pay | Admitting: Family Medicine

## 2022-02-17 DIAGNOSIS — S91109A Unspecified open wound of unspecified toe(s) without damage to nail, initial encounter: Secondary | ICD-10-CM

## 2022-02-17 NOTE — Addendum Note (Signed)
Addended by: Sharee Holster R on: 02/17/2022 10:08 AM   Modules accepted: Orders

## 2022-02-17 NOTE — Telephone Encounter (Signed)
Spoke with [employer]. She said that the pt has an open place on toe as well. Wanting to know what to put on it. Please advise

## 2022-02-17 NOTE — Telephone Encounter (Signed)
[name] [name] called in on patient.  Patient has small sore on R big toe 1 1/2 by 2 cm  pink around edges and dark in the middle.  Wants a call back in regard for further direction.  Call back [phone]

## 2022-02-17 NOTE — Telephone Encounter (Signed)
Order placed, informed [name] [name]

## 2022-02-17 NOTE — Telephone Encounter (Signed)
Called [name] [name] no answer left vm

## 2022-02-18 DIAGNOSIS — H353221 Exudative age-related macular degeneration, left eye, with active choroidal neovascularization: Secondary | ICD-10-CM | POA: Diagnosis not present

## 2022-02-20 ENCOUNTER — Telehealth: Payer: Self-pay | Admitting: Interventional Cardiology

## 2022-02-20 NOTE — Telephone Encounter (Signed)
Pt saw Dr. Irish Lack on 02/11/22 to be cleared for surgery, I dont see a clearance request in here yet however Dr. Irish Lack did clear her. She states they cannot proceed until info is faxed back over to the office, [employer].

## 2022-02-24 ENCOUNTER — Ambulatory Visit (HOSPITAL_COMMUNITY)
Admission: RE | Admit: 2022-02-24 | Discharge: 2022-02-24 | Disposition: A | Discharge status: Home / Self Care | Payer: Medicare HMO | Source: Ambulatory Visit | Attending: Family Medicine | Admitting: Family Medicine

## 2022-02-24 DIAGNOSIS — N6311 Unspecified lump in the right breast, upper outer quadrant: Secondary | ICD-10-CM | POA: Diagnosis not present

## 2022-02-24 DIAGNOSIS — R92321 Mammographic fibroglandular density, right breast: Secondary | ICD-10-CM | POA: Diagnosis not present

## 2022-02-24 NOTE — Telephone Encounter (Signed)
Pt called today and said [facility] still has not received a clearance request. I called [facility] and was able to confirm procedure.      Pre-operative Risk Assessment    Patient Name: [name] [name]  DOB: Aug 26, 1932 MRN: [id number]      Request for Surgical Clearance    Procedure:   B/L CATARACT SURGERY  Date of Surgery:  Clearance TBD                                 Surgeon:  DR. Sharen Counter Surgeon's Group or Practice Name:  [facility] Phone number:  [phone] Fax number:  [phone]   Type of Clearance Requested:   - Medical ; NO MEDICATIONS NEEDING TO BE HELD    Type of Anesthesia:   IV SEDATION   Additional requests/questions:    Jiles Prows   02/24/2022, 9:41 AM

## 2022-02-25 ENCOUNTER — Telehealth: Payer: Self-pay | Admitting: Family Medicine

## 2022-02-25 ENCOUNTER — Ambulatory Visit (INDEPENDENT_AMBULATORY_CARE_PROVIDER_SITE_OTHER): Payer: Medicare HMO | Admitting: Internal Medicine

## 2022-02-25 ENCOUNTER — Encounter: Payer: Self-pay | Admitting: Internal Medicine

## 2022-02-25 DIAGNOSIS — J988 Other specified respiratory disorders: Secondary | ICD-10-CM

## 2022-02-25 DIAGNOSIS — B9789 Other viral agents as the cause of diseases classified elsewhere: Secondary | ICD-10-CM

## 2022-02-25 MED ORDER — DM-GUAIFENESIN ER 30-600 MG PO TB12: 1.0000 | ORAL_TABLET | Freq: Two times a day (BID) | ORAL | 0 refills | Status: DC | PRN

## 2022-02-25 NOTE — Progress Notes (Signed)
   Acute Telephone Visit  Virtual Visit via Telephone Note  I connected with SORREL CASSETTA on 02/25/22 at  2:20 PM EST by telephone and verified that I am speaking with the correct person using two identifiers.  Chief Complaint  Patient presents with   Cough    Cough for 1 week and sometimes feel like wheezing    Location: Patient: [place], [place] [address] Provider: [address], [place], [place] [address]   I discussed the limitations, risks, security and privacy concerns of performing an evaluation and management service by telephone and the availability of in person appointments. I also discussed with the patient that there may be a patient responsible charge related to this service. The patient expressed understanding and agreed to proceed.  History of Present Illness:  Ms. Boylan has been evaluated today via acute telephone encounter for a 1 week history of cough.  She reports onset of cough 1 week ago and endorses associated chest congestion.  She states that she can 'cough stuff up but not out'. Ms. Flinchum endorses mild rhinorrhea, but largely denies further associated symptoms such as shortness of breath, fever/chills, nausea/vomiting, myalgias, and headache.  She denies any recent sick contacts.  She has taken a home COVID-19 test that was negative.  She has been taking an over-the-counter cough medication with dextromethorphan for symptom relief.  Ms. Monical is vaccinated against COVID-19 and received her influenza vaccine this year.  Assessment and Plan:  Viral respiratory illness Evaluated today for 1 week history of the symptoms endorsed above.  Her symptoms seem most consistent with a viral respiratory infection in the absence of fever/chills or a change in quality of sputum.  I have recommended use of Mucinex DM for symptom relief.  A prescription was sent today.  Instructed her to return to care if she develops fever, shortness of breath, or overall feels that her  symptoms are getting worse.  She otherwise has routine follow-up scheduled with Dr. Moshe Cipro on 04/08/22.  Follow Up Instructions:  I discussed the assessment and treatment plan with the patient. The patient was provided an opportunity to ask questions and all were answered. The patient agreed with the plan and demonstrated an understanding of the instructions.   The patient was advised to call back or seek an in-person evaluation if the symptoms worsen or if the condition fails to improve as anticipated.  I provided 7 minutes of non-face-to-face time during this encounter.   Johnette Abraham, MD

## 2022-02-25 NOTE — Telephone Encounter (Signed)
scheduled

## 2022-02-25 NOTE — Telephone Encounter (Signed)
Pt called stating she has had a horrible cough x1 wk. Wants to know what she can do for this?

## 2022-03-05 DIAGNOSIS — S99921D Unspecified injury of right foot, subsequent encounter: Secondary | ICD-10-CM | POA: Diagnosis not present

## 2022-03-05 DIAGNOSIS — R7303 Prediabetes: Secondary | ICD-10-CM | POA: Diagnosis not present

## 2022-03-05 DIAGNOSIS — I10 Essential (primary) hypertension: Secondary | ICD-10-CM | POA: Diagnosis not present

## 2022-03-05 DIAGNOSIS — I872 Venous insufficiency (chronic) (peripheral): Secondary | ICD-10-CM | POA: Diagnosis not present

## 2022-03-05 DIAGNOSIS — M4800 Spinal stenosis, site unspecified: Secondary | ICD-10-CM | POA: Diagnosis not present

## 2022-03-05 DIAGNOSIS — E785 Hyperlipidemia, unspecified: Secondary | ICD-10-CM | POA: Diagnosis not present

## 2022-03-05 DIAGNOSIS — L89892 Pressure ulcer of other site, stage 2: Secondary | ICD-10-CM | POA: Diagnosis not present

## 2022-03-05 DIAGNOSIS — M5430 Sciatica, unspecified side: Secondary | ICD-10-CM | POA: Diagnosis not present

## 2022-03-05 DIAGNOSIS — I739 Peripheral vascular disease, unspecified: Secondary | ICD-10-CM | POA: Diagnosis not present

## 2022-03-06 DIAGNOSIS — M4800 Spinal stenosis, site unspecified: Secondary | ICD-10-CM

## 2022-03-06 DIAGNOSIS — Z9181 History of falling: Secondary | ICD-10-CM

## 2022-03-06 DIAGNOSIS — L89892 Pressure ulcer of other site, stage 2: Secondary | ICD-10-CM

## 2022-03-06 DIAGNOSIS — I10 Essential (primary) hypertension: Secondary | ICD-10-CM

## 2022-03-06 DIAGNOSIS — E785 Hyperlipidemia, unspecified: Secondary | ICD-10-CM

## 2022-03-06 DIAGNOSIS — I739 Peripheral vascular disease, unspecified: Secondary | ICD-10-CM

## 2022-03-06 DIAGNOSIS — M5430 Sciatica, unspecified side: Secondary | ICD-10-CM

## 2022-03-06 DIAGNOSIS — Z7901 Long term (current) use of anticoagulants: Secondary | ICD-10-CM

## 2022-03-06 DIAGNOSIS — S99921D Unspecified injury of right foot, subsequent encounter: Secondary | ICD-10-CM

## 2022-03-06 DIAGNOSIS — Z7952 Long term (current) use of systemic steroids: Secondary | ICD-10-CM

## 2022-03-06 DIAGNOSIS — I872 Venous insufficiency (chronic) (peripheral): Secondary | ICD-10-CM

## 2022-03-06 DIAGNOSIS — R7303 Prediabetes: Secondary | ICD-10-CM

## 2022-03-06 DIAGNOSIS — Z79631 Long term (current) use of antimetabolite agent: Secondary | ICD-10-CM

## 2022-03-12 DIAGNOSIS — E785 Hyperlipidemia, unspecified: Secondary | ICD-10-CM | POA: Diagnosis not present

## 2022-03-12 DIAGNOSIS — I872 Venous insufficiency (chronic) (peripheral): Secondary | ICD-10-CM | POA: Diagnosis not present

## 2022-03-12 DIAGNOSIS — Z7952 Long term (current) use of systemic steroids: Secondary | ICD-10-CM

## 2022-03-12 DIAGNOSIS — M4800 Spinal stenosis, site unspecified: Secondary | ICD-10-CM | POA: Diagnosis not present

## 2022-03-12 DIAGNOSIS — M5430 Sciatica, unspecified side: Secondary | ICD-10-CM | POA: Diagnosis not present

## 2022-03-12 DIAGNOSIS — L89892 Pressure ulcer of other site, stage 2: Secondary | ICD-10-CM | POA: Diagnosis not present

## 2022-03-12 DIAGNOSIS — Z7901 Long term (current) use of anticoagulants: Secondary | ICD-10-CM

## 2022-03-12 DIAGNOSIS — R7303 Prediabetes: Secondary | ICD-10-CM | POA: Diagnosis not present

## 2022-03-12 DIAGNOSIS — Z79631 Long term (current) use of antimetabolite agent: Secondary | ICD-10-CM

## 2022-03-12 DIAGNOSIS — S99921D Unspecified injury of right foot, subsequent encounter: Secondary | ICD-10-CM | POA: Diagnosis not present

## 2022-03-12 DIAGNOSIS — Z9181 History of falling: Secondary | ICD-10-CM

## 2022-03-12 DIAGNOSIS — I739 Peripheral vascular disease, unspecified: Secondary | ICD-10-CM | POA: Diagnosis not present

## 2022-03-12 DIAGNOSIS — I10 Essential (primary) hypertension: Secondary | ICD-10-CM | POA: Diagnosis not present

## 2022-03-25 ENCOUNTER — Other Ambulatory Visit: Payer: Self-pay | Admitting: Family Medicine

## 2022-03-25 NOTE — Telephone Encounter (Signed)
Previously filled by another provider ok to refill ?

## 2022-03-27 ENCOUNTER — Encounter (HOSPITAL_BASED_OUTPATIENT_CLINIC_OR_DEPARTMENT_OTHER): Payer: Medicare HMO | Attending: General Surgery | Admitting: General Surgery

## 2022-03-27 DIAGNOSIS — I872 Venous insufficiency (chronic) (peripheral): Secondary | ICD-10-CM | POA: Insufficient documentation

## 2022-03-27 DIAGNOSIS — G629 Polyneuropathy, unspecified: Secondary | ICD-10-CM | POA: Insufficient documentation

## 2022-03-27 DIAGNOSIS — E43 Unspecified severe protein-calorie malnutrition: Secondary | ICD-10-CM | POA: Diagnosis not present

## 2022-03-27 DIAGNOSIS — Z6827 Body mass index (BMI) 27.0-27.9, adult: Secondary | ICD-10-CM | POA: Diagnosis not present

## 2022-03-27 DIAGNOSIS — L97512 Non-pressure chronic ulcer of other part of right foot with fat layer exposed: Secondary | ICD-10-CM | POA: Insufficient documentation

## 2022-03-27 DIAGNOSIS — I10 Essential (primary) hypertension: Secondary | ICD-10-CM | POA: Insufficient documentation

## 2022-03-27 DIAGNOSIS — I350 Nonrheumatic aortic (valve) stenosis: Secondary | ICD-10-CM | POA: Insufficient documentation

## 2022-03-27 DIAGNOSIS — I824Z2 Acute embolism and thrombosis of unspecified deep veins of left distal lower extremity: Secondary | ICD-10-CM | POA: Diagnosis not present

## 2022-03-27 DIAGNOSIS — S91301A Unspecified open wound, right foot, initial encounter: Secondary | ICD-10-CM | POA: Diagnosis not present

## 2022-03-28 NOTE — Progress Notes (Signed)
MAYU, RONK ([id number]) [phone].pdf Page 1 of 4 Visit Report for 03/27/2022 Abuse Risk Screen Details Patient Name: Date of Service: [name] [name], [name] [name] 03/27/2022 1:15 PM Medical Record Number: [id number] Patient Account Number: [id number] Date of Birth/Sex: Treating RN: 10/19/1932 (89 y.o. Harlow Ohms Primary Care Bladen Umar: [name] Other Clinician: Referring Leeana Creer: Treating Jaylenn Baiza/Extender: Cathlean Sauer in Treatment: 0 Abuse Risk Screen Items Answer ABUSE RISK SCREEN: Has anyone close to you tried to hurt or harm you recentlyo No Do you feel uncomfortable with anyone in your familyo No Has anyone forced you do things that you didnt want to doo No Electronic Signature(s) Signed: 03/27/2022 3:48:53 PM By: Adline Peals Entered By: Adline Peals on 03/27/2022 13:53:39 -------------------------------------------------------------------------------- Activities of Daily Living Details Patient Name: Date of Service: [name] [name], [name] [name] 03/27/2022 1:15 PM Medical Record Number: [id number] Patient Account Number: [id number] Date of Birth/Sex: Treating RN: [birth year] (89 y.o. Harlow Ohms Primary Care Mikela Senn: [name] Other Clinician: Referring Latrese Carolan: Treating Bonham Zingale/Extender: Cathlean Sauer in Treatment: 0 Activities of Daily Living Items Answer Activities of Daily Living (Please select one for each item) Drive Automobile Not Able T Medications ake Completely Able Use T elephone Completely Able Care for Appearance Completely Able Use T oilet Completely Able Bath / Shower Completely Able Dress Self Completely Able Feed Self Completely Able Walk Not Able Get In / Out Bed Need Assistance Housework Need Assistance Prepare Meals Need Assistance Handle Money Need Assistance Shop for Self Need Assistance Electronic Signature(s) Signed: 03/27/2022 3:48:53  PM By: Adline Peals Entered By: Adline Peals on 03/27/2022 13:54:21 Dupin, Tracye [name] ([id number]) [id number] Page 2 of 4 -------------------------------------------------------------------------------- Education Screening Details Patient Name: Date of Service: [name] [name], [name] [name] 03/27/2022 1:15 PM Medical Record Number: [id number] Patient Account Number: [id number] Date of Birth/Sex: Treating RN: Jul 25, 1932 (89 y.o. Harlow Ohms Primary Care Crosby Oriordan: [name] Other Clinician: Referring Ayodele Hartsock: Treating Sebert Stollings/Extender: Cathlean Sauer in Treatment: 0 Primary Learner Assessed: Patient Learning Preferences/Education Level/Primary Language Learning Preference: Explanation, Demonstration, Video, Printed Material Highest Education Level: High School Preferred Language: English Cognitive Barrier Language Barrier: No Translator Needed: No Memory Deficit: No Emotional Barrier: No Cultural/Religious Beliefs Affecting Medical Care: No Physical Barrier Impaired Vision: Yes Glasses Impaired Hearing: No Decreased Hand dexterity: No Knowledge/Comprehension Knowledge Level: Medium Comprehension Level: Medium Ability to understand written instructions: Medium Ability to understand verbal instructions: Medium Motivation Anxiety Level: Calm Cooperation: Cooperative Education Importance: Acknowledges Need Interest in Health Problems: Asks Questions Perception: Coherent Willingness to Engage in Self-Management Medium Activities: Readiness to Engage in Self-Management Medium Activities: Electronic Signature(s) Signed: 03/27/2022 3:48:53 PM By: Adline Peals Entered By: Adline Peals on 03/27/2022 13:54:42 -------------------------------------------------------------------------------- Fall Risk Assessment Details Patient Name: Date of Service: [name] [name], [name] [name]. 03/27/2022 1:15 PM Medical Record Number:  [id number] Patient Account Number: [id number] Date of Birth/Sex: Treating RN: 1932-06-08 (89 y.o. Harlow Ohms Primary Care Layonna Dobie: [name] Other Clinician: Referring Darelle Kings: Treating Hassaan Crite/Extender: Cathlean Sauer in Treatment: 0 Fall Risk Assessment Items Have you had 2 or more falls in the last 12 monthso 0 Yes [place], [address] ([id number]) [id number] Page 3 of 4 Have you had any fall that resulted in injury in the last 12 monthso 0 Yes FALLS RISK SCREEN History of falling - immediate or within 3 months 0 No Secondary diagnosis (Do you have 2 or more medical diagnoseso) 15 Yes Ambulatory aid None/bed rest/wheelchair/nurse 0 Yes Crutches/cane/walker 0 No Furniture 0  No Intravenous therapy Access/Saline/Heparin Lock 0 No Gait/Transferring Normal/ bed rest/ wheelchair 0 Yes Weak (short steps with or without shuffle, stooped but able to lift head while walking, may seek 0 No support from furniture) Impaired (short steps with shuffle, may have difficulty arising from chair, head down, impaired 0 No balance) Mental Status Oriented to own ability 0 Yes Electronic Signature(s) Signed: 03/27/2022 3:48:53 PM By: Adline Peals Entered By: Adline Peals on 03/27/2022 13:55:12 -------------------------------------------------------------------------------- Foot Assessment Details Patient Name: Date of Service: [name] [name], [name] [name] 03/27/2022 1:15 PM Medical Record Number: [id number] Patient Account Number: [id number] Date of Birth/Sex: Treating RN: Oct 21, 1932 (89 y.o. Harlow Ohms Primary Care Paiten Boies: [name] Other Clinician: Referring Joan Herschberger: Treating Adonnis Salceda/Extender: Cathlean Sauer in Treatment: 0 Foot Assessment Items Site Locations + = Sensation present, - = Sensation absent, C = Callus, U = Ulcer R = Redness, W = Warmth, M = Maceration, PU =  Pre-ulcerative lesion F = Fissure, S = Swelling, D = Dryness Assessment Right: Left: Other Deformity: No No Prior Foot Ulcer: No No Prior Amputation: No No Charcot Joint: No No Ambulatory Status: Non-ambulatory Assistance Device: Wheelchair [name] [name], [name] [name] ([id number]) [phone].pdf Page 4 of 4 Gait: Steady Electronic Signature(s) Signed: 03/27/2022 3:48:53 PM By: Adline Peals Entered By: Adline Peals on 03/27/2022 14:03:59 -------------------------------------------------------------------------------- Nutrition Risk Screening Details Patient Name: Date of Service: [name] [name], [name] [name] 03/27/2022 1:15 PM Medical Record Number: [id number] Patient Account Number: [id number] Date of Birth/Sex: Treating RN: October 06, 1932 (89 y.o. Harlow Ohms Primary Care Burnetta Kohls: [name] Other Clinician: Referring Billy Turvey: Treating Betty Brooks/Extender: Cathlean Sauer in Treatment: 0 Height (in): 66 Weight (lbs): 171 Body Mass Index (BMI): 27.6 Nutrition Risk Screening Items Score Screening NUTRITION RISK SCREEN: I have an illness or condition that made me change the kind and/or amount of food I eat 0 No I eat fewer than two meals per day 3 Yes I eat few fruits and vegetables, or milk products 0 No I have three or more drinks of beer, liquor or wine almost every day 0 No I have tooth or mouth problems that make it hard for me to eat 0 No I don't always have enough money to buy the food I need 0 No I eat alone most of the time 0 No I take three or more different prescribed or over-the-counter drugs a day 1 Yes Without wanting to, I have lost or gained 10 pounds in the last six months 0 No I am not always physically able to shop, cook and/or feed myself 2 Yes Nutrition Protocols Good Risk Protocol Moderate Risk Protocol High Risk Proctocol 0 Provide education on nutrition Risk Level: High Risk Score: 6 Electronic  Signature(s) Signed: 03/27/2022 3:48:53 PM By: Adline Peals Entered By: Adline Peals on 03/27/2022 13:55:54

## 2022-03-28 NOTE — Progress Notes (Signed)
[name], [name] ([id number]) [id number] Page 1 of 10 Visit Report for 03/27/2022 Chief Complaint Document Details Patient Name: Date of Service: [name], [name] 03/27/2022 1:15 PM Medical Record Number: [id number] Patient Account Number: [id number] Date of Birth/Sex: Treating RN: 1932/04/16 (89 y.o. F) Primary Care Provider: [name] Other Clinician: Referring Provider: Treating Provider/Extender: Cathlean Sauer in Treatment: 0 Information Obtained from: Patient Chief Complaint Patient seen for complaints of Non-Healing Wound. Electronic Signature(s) Signed: 03/27/2022 2:25:30 PM By: Fredirick Maudlin MD FACS Entered By: Fredirick Maudlin on 03/27/2022 14:25:30 -------------------------------------------------------------------------------- Debridement Details Patient Name: Date of Service: [name] [name] SPLINTER 03/27/2022 1:15 PM Medical Record Number: [id number] Patient Account Number: [id number] Date of Birth/Sex: Treating RN: 1932/05/03 (89 y.o. [name] [name] Primary Care Provider: [name] Other Clinician: Referring Provider: Treating Provider/Extender: Cathlean Sauer in Treatment: 0 Debridement Performed for Assessment: Wound #1 Right T Great oe Performed By: Physician Fredirick Maudlin, MD Debridement Type: Debridement Level of Consciousness (Pre-procedure): Awake and Alert Pre-procedure Verification/Time Out Yes - 14:19 Taken: Start Time: 14:19 Pain Control: Lidocaine 4% T opical Solution T Area Debrided (L x W): otal 0.2 (cm) x 0.6 (cm) = 0.12 (cm) Tissue and other material debrided: Non-Viable, Callus, Slough, Subcutaneous, Slough Level: Skin/Subcutaneous Tissue Debridement Description: Excisional Instrument: Curette Bleeding: Minimum Hemostasis Achieved: Pressure Response to Treatment: Procedure was tolerated well Level of Consciousness (Post- Awake and Alert procedure): Post  Debridement Measurements of Total Wound Length: (cm) 0.2 Width: (cm) 0.6 Depth: (cm) 0.1 Volume: (cm) 0.009 Character of Wound/Ulcer Post Debridement: Improved Post Procedure Diagnosis Same as Pre-procedure [name], [name] [name] [name] ([id number]) [id number] Page 2 of 10 Notes scribed for Dr. Celine Ahr by Adline Peals, RN Electronic Signature(s) Signed: 03/27/2022 3:18:58 PM By: Fredirick Maudlin MD FACS Signed: 03/27/2022 3:48:53 PM By: Sabas Sous By: Adline Peals on 03/27/2022 14:21:31 -------------------------------------------------------------------------------- HPI Details Patient Name: Date of Service: [name], [name] 03/27/2022 1:15 PM Medical Record Number: [id number] Patient Account Number: [id number] Date of Birth/Sex: Treating RN: 11/21/1932 (89 y.o. 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 She apparently was receiving treatment for bilateral leg ulcers with Unna boots when her home health nurse reported a new ulcer on her right great toe near the end of November. She was referred to wound care for further evaluation and management of this. She is not diabetic and does not smoke. She does take prednisone and methotrexate, in addition to Eliquis. ABI in clinic today was 1.23. Notably, she does not have any open ulcers on either lower leg. The wound on her toe is small with slough accumulation. It does extend into the fat layer. There is no concern for infection. Electronic Signature(s) Signed: 03/27/2022 2:31:18 PM By: Fredirick Maudlin MD FACS Entered By:  Fredirick Maudlin on 03/27/2022 14:31:18 -------------------------------------------------------------------------------- Physical Exam Details Patient Name: Date of Service: [name] [name] [name] [name] 03/27/2022 1:15 PM Medical Record Number: [id number] Patient Account Number: [id number] Date of Birth/Sex: Treating RN: 10/27/1932 (89 y.o. F) Primary Care Provider: [name] Other Clinician: Referring Provider: Treating Provider/Extender: Cathlean Sauer in Treatment: 0 Constitutional Hypertensive, asymptomatic. . . . No acute distress. Respiratory Normal work of breathing on room air. Notes 03/27/2022: The  wound on her toe is small with slough accumulation. It does extend into the fat layer. There is no concern for infection. Electronic Signature(s) Signed: 03/27/2022 2:31:59 PM By: Fredirick Maudlin MD FACS Entered By: Fredirick Maudlin on 03/27/2022 14:31:59 [name] [name] ([id number]) [id number] Page 3 of 10 -------------------------------------------------------------------------------- Physician Orders Details Patient Name: Date of Service: [name], [name] 03/27/2022 1:15 PM Medical Record Number: [id number] Patient Account Number: [id number] Date of Birth/Sex: Treating RN: 1933/02/26 (89 y.o. [name] [name] Primary Care Provider: [name] Other Clinician: Referring Provider: Treating Provider/Extender: Cathlean Sauer in Treatment: 0 Verbal / Phone Orders: No Diagnosis Coding ICD-10 Coding Code Description [phone] Non-pressure chronic ulcer of other part of right foot with fat layer exposed I82.5Z2 Chronic embolism and thrombosis of unspecified deep veins of left distal lower extremity I87.2 Venous insufficiency (chronic) (peripheral) E43 Unspecified severe protein-calorie malnutrition G62.9 Polyneuropathy, unspecified I10 Essential (primary) hypertension I35.0 Nonrheumatic aortic (valve)  stenosis Follow-up Appointments ppointment in 2 weeks. - Dr. Celine Ahr - room 2 Return A Anesthetic (In clinic) Topical Lidocaine 4% applied to wound bed Bathing/ Shower/ Hygiene May shower and wash wound with soap and water. Edema Control - Lymphedema / SCD / Other Elevate legs to the level of the heart or above for 30 minutes daily and/or when sitting for 3-4 times a day throughout the day. Avoid standing for long periods of time. Patient to wear own compression stockings every day. Moisturize legs daily. Home Health New wound care orders this week; continue Home Health for wound care. May utilize formulary equivalent dressing for wound treatment orders unless otherwise specified. Other Home Health Orders/Instructions: - [facility] Wound Treatment Wound #1 - T Great oe Wound Laterality: Right Cleanser: Soap and Water 3 x Per Week/30 Days Discharge Instructions: May shower and wash wound with dial antibacterial soap and water prior to dressing change. Cleanser: Wound Cleanser 3 x Per Week/30 Days Discharge Instructions: Cleanse the wound with wound cleanser prior to applying a clean dressing using gauze sponges, not tissue or cotton balls. Prim Dressing: Sorbalgon AG Dressing 2x2 (in/in) 3 x Per Week/30 Days ary Discharge Instructions: Apply to wound bed as instructed Secondary Dressing: Woven Gauze Sponges 2x2 in 3 x Per Week/30 Days Discharge Instructions: Apply over primary dressing as directed. Secured With: Child psychotherapist, Sterile 2x75 (in/in) 3 x Per Week/30 Days Discharge Instructions: Secure with stretch gauze as directed. Secured With: Transpore Surgical Tape, 2x10 (in/yd) 3 x Per Week/30 Days Discharge Instructions: Secure dressing with tape as directed. Patient Medications llergies: CellCept, Statins-HMG-CoA Reductase Inhibitors, ciprofloxacin, hydrocodone, penicillin A Notifications Medication Indication Start End 03/27/2022 lidocaine [name], [name] [name]  ([id number]) [id number] Page 4 of 10 DOSE topical 4 % cream - cream topical Electronic Signature(s) Signed: 03/27/2022 3:18:58 PM By: Fredirick Maudlin MD FACS Entered By: Fredirick Maudlin on 03/27/2022 14:32:16 -------------------------------------------------------------------------------- Problem List Details Patient Name: Date of Service: Tierney, ADALEENA MOOERS 03/27/2022 1:15 PM Medical Record Number: [id number] Patient Account Number: [id number] Date of Birth/Sex: Treating RN: 03/27/1932 (89 y.o. F) Primary Care Provider: [name] Other Clinician: Referring Provider: Treating Provider/Extender: Cathlean Sauer in Treatment: 0 Active Problems ICD-10 Encounter Code Description Active Date MDM Diagnosis [phone] Non-pressure chronic ulcer of other part of right foot with fat layer exposed 03/27/2022 No Yes I82.5Z2 Chronic embolism and thrombosis of unspecified deep veins of left distal lower 03/27/2022 No Yes extremity I87.2 Venous insufficiency (chronic) (peripheral) 03/27/2022 No Yes E43 Unspecified severe protein-calorie malnutrition 03/27/2022 No Yes G62.9 Polyneuropathy,  unspecified 03/27/2022 No Yes I10 Essential (primary) hypertension 03/27/2022 No Yes I35.0 Nonrheumatic aortic (valve) stenosis 03/27/2022 No Yes Inactive Problems Resolved Problems Electronic Signature(s) Signed: 03/27/2022 2:24:35 PM By: Fredirick Maudlin MD FACS Previous Signature: 03/27/2022 2:08:21 PM Version By: Fredirick Maudlin MD FACS Previous Signature: 03/27/2022 2:07:34 PM Version By: Fredirick Maudlin MD FACS Entered By: Fredirick Maudlin on 03/27/2022 14:24:34 [place], Talynn [name] ([id number]) [id number] Page 5 of 10 -------------------------------------------------------------------------------- Progress Note Details Patient Name: Date of Service: [name], [name] 03/27/2022 1:15 PM Medical Record Number: [id number] Patient Account Number:  [id number] Date of Birth/Sex: Treating RN: 1932-04-07 (89 y.o. F) Primary Care Provider: [name] Other Clinician: Referring Provider: Treating Provider/Extender: Cathlean Sauer in Treatment: 0 Subjective Chief Complaint Information obtained from Patient Patient seen for complaints of Non-Healing Wound. 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 She apparently was receiving treatment for bilateral leg ulcers with Unna boots when her home health nurse reported a new ulcer on her right great toe near the end of November. She was referred to wound care for further evaluation and management of this. She is not diabetic and does not smoke. She does take prednisone and methotrexate, in addition to Eliquis. ABI in clinic today was 1.23. Notably, she does not have any open ulcers on either lower leg. The wound on her toe is small with slough accumulation. It does extend into the fat layer. There is no concern for infection. Patient History Information obtained from Patient. Allergies CellCept, Statins-HMG-CoA Reductase Inhibitors, ciprofloxacin, hydrocodone, penicillin Family History Cancer - Siblings, Diabetes - Siblings, Hypertension - Child, No family history of Heart Disease, Hereditary Spherocytosis, Kidney Disease, Lung Disease, Seizures, Stroke, Thyroid Problems, Tuberculosis. Social History Never smoker, Marital Status - Widowed, Alcohol Use - Never, Drug Use - No History, Caffeine Use - Rarely. Medical History Eyes Patient has history of Cataracts Cardiovascular Patient has history of Deep Vein Thrombosis, Hypertension, Peripheral Venous Disease - venous stasis Endocrine Denies history of Type I Diabetes, Type II  Diabetes Musculoskeletal Patient has history of Osteoarthritis Neurologic Patient has history of Neuropathy Hospitalization/Surgery History - muscle biopsy. - breast biopsy. - esophagogastroduodenoscopy. - colonoscopy. - abdominal hysterectomy. - foot surgery. - appendectomy. - cholecystectomy. - tonsillectomy. Medical A Surgical History Notes nd Cardiovascular elevated LFT aortic arthrosclerosis and stenosis, mitral valve regurgitation s, Gastrointestinal GERD Musculoskeletal myopathy, lumbar spinal stenosis Oncologic uterine cancer Review of Systems (ROS) Constitutional Symptoms (General Health) Denies complaints or symptoms of Fatigue, Fever, Chills, Marked Weight Change. Eyes Complains or has symptoms of Glasses / Contacts. Ear/Nose/Mouth/Throat Denies complaints or symptoms of Chronic sinus problems or rhinitis. Respiratory Denies complaints or symptoms of Chronic or frequent coughs, Shortness of Breath. Gastrointestinal Denies complaints or symptoms of Frequent diarrhea, Nausea, Vomiting. Endocrine Denies complaints or symptoms of Heat/cold intolerance. Genitourinary Denies complaints or symptoms of Frequent urination. Integumentary (Skin) Complains or has symptoms of Wounds. [name], [name] ([id number]) [id number] Page 6 of 10 Musculoskeletal Complains or has symptoms of Muscle Pain, Muscle Weakness. Psychiatric Denies complaints or symptoms of Claustrophobia. Objective Constitutional Hypertensive, asymptomatic. No acute distress. Vitals Time Taken: 1:50 PM, Height: 66 in, Source: Stated, Weight: 171 lbs, Source: Stated, BMI: 27.6, Temperature: 97.8 F, Pulse: 88 bpm, Respiratory Rate: 16 breaths/min, Blood Pressure: 160/77 mmHg. Respiratory Normal work of breathing on room air. General Notes:  03/27/2022: The wound on her toe is small with slough accumulation. It does extend into the fat layer. There is no concern for  infection. Integumentary (Hair, Skin) Wound #1 status is Open. Original cause of wound was Gradually Appeared. The date acquired was: 02/04/2022. The wound is located on the Right T Great. oe The wound measures 0.2cm length x 0.6cm width x 0.1cm depth; 0.094cm^2 area and 0.009cm^3 volume. There is Fat Layer (Subcutaneous Tissue) exposed. There is no tunneling or undermining noted. There is a medium amount of serous drainage noted. The wound margin is distinct with the outline attached to the wound base. There is medium (34-66%) pink granulation within the wound bed. There is a medium (34-66%) amount of necrotic tissue within the wound bed including Adherent Slough. The periwound skin appearance had no abnormalities noted for color. The periwound skin appearance exhibited: Callus, Dry/Scaly. 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 There was a Excisional Skin/Subcutaneous Tissue Debridement o oe with a total area of 0.12 sq cm performed by Fredirick Maudlin, MD. With the following instrument(s): Curette to remove Non-Viable tissue/material. Material removed includes Callus, Subcutaneous Tissue, and Slough after achieving pain control using Lidocaine 4% T opical Solution. No specimens were taken. A time out was conducted at 14:19, prior to the start of the procedure. A Minimum amount of bleeding was controlled with Pressure. The procedure was tolerated well. Post Debridement Measurements: 0.2cm length x 0.6cm width x 0.1cm depth; 0.009cm^3  volume. Character of Wound/Ulcer Post Debridement is improved. Post procedure Diagnosis Wound #1: Same as Pre-Procedure General Notes: scribed for Dr. Celine Ahr by Adline Peals, RN. Plan Follow-up Appointments: Return Appointment in 2 weeks. - Dr. Celine Ahr - room 2 Anesthetic: (In clinic) Topical Lidocaine 4% applied to wound bed Bathing/ Shower/ Hygiene: May shower and wash wound with soap and water. Edema Control - Lymphedema / SCD / Other: Elevate legs to the level of the heart or above for 30 minutes daily and/or when sitting for 3-4 times a day throughout the day. Avoid standing for long periods of time. Patient to wear own compression stockings every day. Moisturize legs daily. Home Health: New wound care orders this week; continue Home Health for wound care. May utilize formulary equivalent dressing for wound treatment orders unless [name], [name] ([id number]) [phone].pdf Page 7 of 10 otherwise specified. Other Home Health Orders/Instructions: [name] The following medication(s) was prescribed: lidocaine topical 4 % cream cream topical was prescribed at facility WOUND #1: - T Great Wound Laterality: Right oe Cleanser: Soap and Water 3 x Per Week/30 Days Discharge Instructions: May shower and wash wound with dial antibacterial soap and water prior to dressing change. Cleanser: Wound Cleanser 3 x Per Week/30 Days Discharge Instructions: Cleanse the wound with wound cleanser prior to applying a clean dressing using gauze sponges, not tissue or cotton balls. Prim Dressing: Sorbalgon AG Dressing 2x2 (in/in) 3 x Per Week/30 Days ary Discharge Instructions: Apply to wound bed as instructed Secondary Dressing: Woven Gauze Sponges 2x2 in 3 x Per Week/30 Days Discharge Instructions: Apply over primary dressing as directed. Secured With: Child psychotherapist, Sterile 2x75 (in/in) 3 x Per Week/30  Days Discharge Instructions: Secure with stretch  gauze as directed. Secured With: Transpore Surgical T ape, 2x10 (in/yd) 3 x Per Week/30 Days Discharge Instructions: Secure dressing with tape as directed. 03/27/2022: This is an 89 year old woman with a history of venous insufficiency and venous stasis ulcers. She does not have any venous ulcers at this time. She is here today for evaluation of a wound on her right great toe. The wound on her toe is small with slough accumulation. It does extend into the fat layer. There is no concern for infection. I used a curette to debride callus, slough, and nonviable subcutaneous tissue from her wound. We will use silver alginate. There is no indication for her to be wearing Unna boots at this point. She does have compression stockings at home and has an aide that can assist her with donning and doffing them. She will follow-up in 2 weeks, as she has eye surgery scheduled for next week. Electronic Signature(s) Signed: 03/27/2022 2:33:38 PM By: Fredirick Maudlin MD FACS Entered By: Fredirick Maudlin on 03/27/2022 14:33:38 -------------------------------------------------------------------------------- HxROS Details Patient Name: Date of Service: [name] [name] ATCHLEY 03/27/2022 1:15 PM Medical Record Number: [id number] Patient Account Number: [id number] Date of Birth/Sex: Treating RN: 03/03/1933 (89 y.o. [name] [name] Primary Care Provider: [name] Other Clinician: Referring Provider: Treating Provider/Extender: Cathlean Sauer in Treatment: 0 Information Obtained From Patient Constitutional Symptoms (General Health) Complaints and Symptoms: Negative for: Fatigue; Fever; Chills; Marked Weight Change Eyes Complaints and Symptoms: Positive for: Glasses / Contacts Medical History: Positive for: Cataracts Ear/Nose/Mouth/Throat Complaints and Symptoms: Negative for: Chronic sinus problems or rhinitis Respiratory Complaints and Symptoms: Negative for: Chronic or  frequent coughs; Shortness of Breath Gastrointestinal Complaints and Symptoms: LEVETTA, BOGNAR ([id number]) [id number] Page 8 of 10 Negative for: Frequent diarrhea; Nausea; Vomiting Medical History: Past Medical History Notes: GERD Endocrine Complaints and Symptoms: Negative for: Heat/cold intolerance Medical History: Negative for: Type I Diabetes; Type II Diabetes Genitourinary Complaints and Symptoms: Negative for: Frequent urination Integumentary (Skin) Complaints and Symptoms: Positive for: Wounds Musculoskeletal Complaints and Symptoms: Positive for: Muscle Pain; Muscle Weakness Medical History: Positive for: Osteoarthritis Past Medical History Notes: myopathy, lumbar spinal stenosis Psychiatric Complaints and Symptoms: Negative for: Claustrophobia Hematologic/Lymphatic Cardiovascular Medical History: Positive for: Deep Vein Thrombosis; Hypertension; Peripheral Venous Disease - venous stasis Past Medical History Notes: elevated LFT aortic arthrosclerosis and stenosis, mitral valve regurgitation s, Immunological Neurologic Medical History: Positive for: Neuropathy Oncologic Medical History: Past Medical History Notes: uterine cancer HBO Extended History Items Eyes: Cataracts Immunizations Pneumococcal Vaccine: Received Pneumococcal Vaccination: Yes Received Pneumococcal Vaccination On or After 60th Birthday: Yes Implantable Devices None Hospitalization / Surgery History Type of Hospitalization/Surgery muscle biopsy breast biopsy Kessner, Shakari [name] ([id number]) [id number] Page 9 of 10 esophagogastroduodenoscopy colonoscopy abdominal hysterectomy foot surgery appendectomy cholecystectomy tonsillectomy Family and Social History Cancer: Yes - Siblings; Diabetes: Yes - Siblings; Heart Disease: No; Hereditary Spherocytosis: No; Hypertension: Yes - Child; Kidney Disease: No; Lung Disease: No; Seizures:  No; Stroke: No; Thyroid Problems: No; Tuberculosis: No; Never smoker; Marital Status - Widowed; Alcohol Use: Never; Drug Use: No History; Caffeine Use: Rarely; Financial Concerns: No; Food, Clothing or Shelter Needs: No; Support System Lacking: No; Transportation Concerns: No Electronic Signature(s) Signed: 03/27/2022 3:18:58 PM By: Fredirick Maudlin MD FACS Signed: 03/27/2022 3:48:53 PM By: Adline Peals Entered By: Adline Peals on 03/27/2022 13:53:36 -------------------------------------------------------------------------------- SuperBill Details Patient Name: Date of Service: OKSANA, DEBERRY 03/27/2022 Medical Record Number: [id number] Patient Account Number: [id number] Date of Birth/Sex: Treating  RN: 1932-10-23 (89 y.o. F) Primary Care Provider: [name] Other Clinician: Referring Provider: Treating Provider/Extender: Cathlean Sauer in Treatment: 0 Diagnosis Coding ICD-10 Codes Code Description [phone] Non-pressure chronic ulcer of other part of right foot with fat layer exposed I82.5Z2 Chronic embolism and thrombosis of unspecified deep veins of left distal lower extremity I87.2 Venous insufficiency (chronic) (peripheral) E43 Unspecified severe protein-calorie malnutrition G62.9 Polyneuropathy, unspecified I10 Essential (primary) hypertension I35.0 Nonrheumatic aortic (valve) stenosis Facility Procedures : CPT4 Code: [id number] Description: [place] VISIT-LEV 3 EST PT Modifier: 25 Quantity: 1 : CPT4 Code: 20100712 Description: 19758 - DEB SUBQ TISSUE 20 SQ CM/< ICD-10 Diagnosis Description L97.512 Non-pressure chronic ulcer of other part of right foot with fat layer exposed Modifier: Quantity: 1 Physician Procedures : CPT4 Code Description Modifier [id number] - WC PHYS LEVEL 4 - NEW PT 25 ICD-10 Diagnosis Description L97.512 Non-pressure chronic ulcer of other part of right foot with fat layer exposed I87.2 Venous  insufficiency (chronic) (peripheral) E43  Unspecified severe protein-calorie malnutrition G62.9 Polyneuropathy, unspecified Quantity: 1 Electronic Signature(s) Signed: 03/27/2022 3:18:58 PM By: Fredirick Maudlin MD FACS Signed: 03/27/2022 3:48:53 PM By: Adline Peals Previous Signature: 03/27/2022 2:34:05 PM Version By: Fredirick Maudlin MD FACS Entered By: Adline Peals on 03/27/2022 14:45:04

## 2022-03-28 NOTE — Progress Notes (Addendum)
LYZBETH, GENRICH ([id number]) [id number] Page 1 of 8 Visit Report for 03/27/2022 Allergy List Details Patient Name: Date of Service: [name] [name], [name] [name] 03/27/2022 1:15 PM Medical Record Number: [id number] Patient Account Number: [id number] Date of Birth/Sex: Treating RN: [birth year] (89 y.o. Harlow Ohms Primary Care Amardeep Beckers: [name] Other Clinician: Referring Jorene Kaylor: Treating Hayley Horn/Extender: Cathlean Sauer in Treatment: 0 Allergies Active Allergies CellCept Statins-HMG-CoA Reductase Inhibitors ciprofloxacin hydrocodone penicillin Allergy Notes Electronic Signature(s) Signed: 03/27/2022 3:48:53 PM By: Adline Peals Entered By: Adline Peals on 03/27/2022 13:50:33 -------------------------------------------------------------------------------- Arrival Information Details Patient Name: Date of Service: [name] [name], [name] [name] 03/27/2022 1:15 PM Medical Record Number: [id number] Patient Account Number: [id number] Date of Birth/Sex: Treating RN: 1932/06/11 (89 y.o. Harlow Ohms Primary Care Jarryd Gratz: [name] Other Clinician: Referring Dhanvin Szeto: Treating Keefer Soulliere/Extender: Cathlean Sauer in Treatment: 0 Visit Information Patient Arrived: Wheel Chair Arrival Time: 13:39 Accompanied By: caregiver Transfer Assistance: Manual Patient Identification Verified: Yes Secondary Verification Process Completed: Yes Patient Has Alerts: Yes Patient Alerts: Patient on Blood Thinner Electronic Signature(s) Signed: 03/27/2022 3:48:53 PM By: Adline Peals Entered By: Adline Peals on 03/27/2022 13:49:58 Rohner, Abigial E ([id number]) [id number] Page 2 of 8 -------------------------------------------------------------------------------- Clinic Level of Care Assessment Details Patient Name: Date of Service: [name] [name], [name] [name] 03/27/2022 1:15 PM Medical Record  Number: [id number] Patient Account Number: [id number] Date of Birth/Sex: Treating RN: [birth year] (89 y.o. Harlow Ohms Primary Care Ercole Georg: [name] Other Clinician: Referring Cortnie Ringel: Treating Joah Patlan/Extender: Cathlean Sauer in Treatment: 0 Clinic Level of Care Assessment Items TOOL 1 Quantity Score X- 1 0 Use when EandM and Procedure is performed on INITIAL visit ASSESSMENTS - Nursing Assessment / Reassessment X- 1 20 General Physical Exam (combine w/ comprehensive assessment (listed just below) when performed on new pt. evals) X- 1 25 Comprehensive Assessment (HX, ROS, Risk Assessments, Wounds Hx, etc.) ASSESSMENTS - Wound and Skin Assessment / Reassessment '[]'$  - 0 Dermatologic / Skin Assessment (not related to wound area) ASSESSMENTS - Ostomy and/or Continence Assessment and Care '[]'$  - 0 Incontinence Assessment and Management '[]'$  - 0 Ostomy Care Assessment and Management (repouching, etc.) PROCESS - Coordination of Care X - Simple Patient / Family Education for ongoing care 1 15 '[]'$  - 0 Complex (extensive) Patient / Family Education for ongoing care X- 1 10 Staff obtains Programmer, systems, Records, T Results / Process Orders est '[]'$  - 0 Staff telephones HHA, Nursing Homes / Clarify orders / etc '[]'$  - 0 Routine Transfer to another Facility (non-emergent condition) '[]'$  - 0 Routine Hospital Admission (non-emergent condition) X- 1 15 New Admissions / Biomedical engineer / Ordering NPWT Apligraf, etc. , '[]'$  - 0 Emergency Hospital Admission (emergent condition) PROCESS - Special Needs '[]'$  - 0 Pediatric / Minor Patient Management '[]'$  - 0 Isolation Patient Management '[]'$  - 0 Hearing / Language / Visual special needs '[]'$  - 0 Assessment of Community assistance (transportation, D/C planning, etc.) '[]'$  - 0 Additional assistance / Altered mentation '[]'$  - 0 Support Surface(s) Assessment (bed, cushion, seat, etc.) INTERVENTIONS -  Miscellaneous '[]'$  - 0 External ear exam '[]'$  - 0 Patient Transfer (multiple staff / Civil Service fast streamer / Similar devices) '[]'$  - 0 Simple Staple / Suture removal (25 or less) '[]'$  - 0 Complex Staple / Suture removal (26 or more) '[]'$  - 0 Hypo/Hyperglycemic Management (do not check if billed separately) X- 1 15 Ankle / Brachial Index (ABI) - do not check if billed separately Has the patient been seen at the hospital within the  last three years: Yes Total Score: 100 Level Of Care: New/Established - Level 3 Electronic Signature(s) Signed: 03/27/2022 3:48:53 PM By: Loraine Grip ([id number]) [id number] Page 3 of 8 Signed: 03/27/2022 3:48:53 PM By: Gelene Mink By: Samuella Bruin on 03/27/2022 14:44:56 -------------------------------------------------------------------------------- Encounter Discharge Information Details Patient Name: Date of Service: [name] [name], [name] [name] 03/27/2022 1:15 PM Medical Record Number: [id number] Patient Account Number: [id number] Date of Birth/Sex: Treating RN: 1932/05/10 (89 y.o. Fredderick Phenix Primary Care Chianne Byrns: Syliva Overman Other Clinician: Referring Vauda Salvucci: Treating Keisuke Hollabaugh/Extender: Standley Dakins in Treatment: 0 Encounter Discharge Information Items Post Procedure Vitals Discharge Condition: Stable Temperature (F): 97.8 Ambulatory Status: Wheelchair Pulse (bpm): 88 Discharge Destination: Home Respiratory Rate (breaths/min): 16 Transportation: Private Auto Blood Pressure (mmHg): 160/77 Accompanied By: caregiver Schedule Follow-up Appointment: Yes Clinical Summary of Care: Patient Declined Electronic Signature(s) Signed: 03/27/2022 3:48:53 PM By: Samuella Bruin Entered By: Samuella Bruin on 03/27/2022 14:45:52 -------------------------------------------------------------------------------- Lower Extremity Assessment Details Patient Name: Date of  Service: [name] [name], [name] [name] 03/27/2022 1:15 PM Medical Record Number: [id number] Patient Account Number: [id number] Date of Birth/Sex: Treating RN: 1932/10/07 (89 y.o. Fredderick Phenix Primary Care Sukhraj Esquivias: Syliva Overman Other Clinician: Referring Navea Woodrow: Treating Torey Regan/Extender: Standley Dakins in Treatment: 0 Edema Assessment Assessed: Kyra Searles: No] Franne Forts: No] [Left: Edema] [Right: :] Calf Left: Right: Point of Measurement: From Medial Instep 37 cm 34.6 cm Ankle Left: Right: Point of Measurement: From Medial Instep 26 cm 27 cm Knee To Floor Left: Right: From Medial Instep 38 cm 38 cm Vascular Assessment Pulses: Dorsalis Pedis Palpable: [Right:Yes] Blood Pressure: Shackett, Yuval E ([id number]) [Right:[id number] Page 4 of 8] Brachial: [Right:160] Ankle: [Right:Dorsalis Pedis: 196 1.23] Electronic Signature(s) Signed: 03/27/2022 3:48:53 PM By: Samuella Bruin Entered By: Samuella Bruin on 03/27/2022 14:12:05 -------------------------------------------------------------------------------- Multi Wound Chart Details Patient Name: Date of Service: CRISPJalacia, Cowling 03/27/2022 1:15 PM Medical Record Number: [id number] Patient Account Number: [id number] Date of Birth/Sex: Treating RN: [birth year] (89 y.o. F) Primary Care Macio Kissoon: Syliva Overman Other Clinician: Referring Jacelynn Hayton: Treating Nashly Olsson/Extender: Standley Dakins in Treatment: 0 Vital Signs Height(in): 66 Pulse(bpm): 88 Weight(lbs): 171 Blood Pressure(mmHg): 160/77 Body Mass Index(BMI): 27.6 Temperature(F): 97.8 Respiratory Rate(breaths/min): 16 [1:Photos: No Photos Right T Great oe Wound Location: Gradually Appeared Wounding Event: T be determined o Primary Etiology: Cataracts, Deep Vein Thrombosis, Comorbid History: Hypertension, Peripheral Venous Disease, Osteoarthritis, Neuropathy 02/04/2022 Date  Acquired: 0 Weeks of  Treatment: Open Wound Status: No Wound Recurrence: 0.2x0.6x0.1 Measurements L x W x D (cm) 0.094 A (cm) : rea 0.009 Volume (cm) : Full Thickness Without Exposed Classification: Support Structures Medium Exudate A mount: Serous  Exudate Type: amber Exudate Color: Distinct, outline attached Wound Margin: Medium (34-66%) Granulation A mount: Pink Granulation Quality: Medium (34-66%) Necrotic A mount: Fat Layer (Subcutaneous Tissue): Yes N/A Exposed Structures: Fascia: No Tendon:  No Muscle: No Joint: No Bone: No Small (1-33%) Epithelialization: Debridement - Excisional Debridement: Pre-procedure Verification/Time Out 14:19 Taken: Lidocaine 4% Topical Solution Pain Control: Callus, Subcutaneous, Slough Tissue Debrided:  Skin/Subcutaneous Tissue Level: 0.12 Debridement A (sq cm): rea Curette Instrument: Minimum Bleeding: Pressure Hemostasis A chieved: Procedure was tolerated well Debridement Treatment Response: 0.2x0.6x0.1 Post Debridement Measurements L x W x D (cm)  0.009 Post Debridement Volume: (cm) Callus: Yes Periwound Skin Texture:] [N/A:N/A N/A N/A N/A N/A N/A N/A N/A N/A N/A N/A N/A N/A N/A N/A N/A N/A N/A N/A N/A N/A N/A N/A N/A N/A N/A N/A N/A N/A N/A N/A N/A N/A N/A]  DENALY, OSUCH ([id number]) [1:Dry/Scaly: Yes Periwound Skin Moisture: No Abnormalities Noted Periwound Skin Color: Debridement Procedures Performed:] [N/A:N/A N/A N/A] Treatment Notes Electronic Signature(s) Signed: 03/27/2022 2:24:41 PM By: Duanne Guess MD FACS Entered By: Duanne Guess on 03/27/2022 14:24:41 -------------------------------------------------------------------------------- Multi-Disciplinary Care Plan Details Patient Name: Date of Service: [name] [name], [name] [name] 03/27/2022 1:15 PM Medical Record Number: [id number] Patient Account Number: [id number] Date of Birth/Sex: Treating RN: Jun 02, 1932 (89 y.o. Fredderick Phenix Primary Care Jobanny Mavis: Syliva Overman Other Clinician: Referring Dawnna Gritz: Treating  [place] Fassnacht/Extender: Standley Dakins in Treatment: 0 Active Inactive Electronic Signature(s) Signed: 04/19/2022 7:44:16 PM By: Shawn Stall RN, BSN Signed: 09/22/2022 3:03:48 PM By: Samuella Bruin Previous Signature: 03/27/2022 3:48:53 PM Version By: Samuella Bruin Entered By: Shawn Stall on 04/19/2022 19:44:15 -------------------------------------------------------------------------------- Pain Assessment Details Patient Name: Date of Service: [name] [name], [name] [name] 03/27/2022 1:15 PM Medical Record Number: [id number] Patient Account Number: [id number] Date of Birth/Sex: Treating RN: 12-14-1932 (89 y.o. Fredderick Phenix Primary Care Nova Schmuhl: Syliva Overman Other Clinician: Referring Ricki Vanhandel: Treating Jossie Smoot/Extender: Standley Dakins in Treatment: 0 Active Problems Location of Pain Severity and Description of Pain Patient Has Paino No Site Locations Rate the pain. EDELIN, RESTREPO ([id number]) [id number] Page 6 of 8 Rate the pain. Current Pain Level: 0 Pain Management and Medication Current Pain Management: Electronic Signature(s) Signed: 03/27/2022 3:48:53 PM By: Samuella Bruin Entered By: Samuella Bruin on 03/27/2022 14:09:14 -------------------------------------------------------------------------------- Patient/Caregiver Education Details Patient Name: Date of Service: [name] [name] 1/5/2024andnbsp1:15 PM Medical Record Number: [id number] Patient Account Number: [id number] Date of Birth/Gender: Treating RN: 1932-11-09 (89 y.o. Fredderick Phenix Primary Care Physician: Syliva Overman Other Clinician: Referring Physician: Treating Physician/Extender: Standley Dakins in Treatment: 0 Education Assessment Education Provided To: Patient Education Topics Provided Welcome T [facility]New [facility] Packet: o Methods: Explain/Verbal Responses:  Reinforcements needed, State content correctly Electronic Signature(s) Signed: 03/27/2022 3:48:53 PM By: Samuella Bruin Entered By: Samuella Bruin on 03/27/2022 14:44:26 -------------------------------------------------------------------------------- Wound Assessment Details Patient Name: Date of Service: [name] [name], [name] [name] 03/27/2022 1:15 PM Medical Record Number: [id number] Patient Account Number: [id number] Date of Birth/Sex: Treating RN: 12-Apr-1932 (89 y.o. Fredderick Phenix Primary Care Myrla Malanowski: Syliva Overman Other Clinician: Referring Janavia Rottman: Treating Amere Bricco/Extender: Maurilio Lovely Karbowski, [place] E ([id number]) [id number] Page 7 of 8 Weeks in Treatment: 0 Wound Status Wound Number: 1 Primary T be determined o Etiology: Wound Location: Right T Great oe Wound Open Wounding Event: Gradually Appeared Status: Date Acquired: 02/04/2022 Comorbid Cataracts, Deep Vein Thrombosis, Hypertension, Peripheral Weeks Of Treatment: 0 History: Venous Disease, Osteoarthritis, Neuropathy Clustered Wound: No Wound Measurements Length: (cm) 0.2 Width: (cm) 0.6 Depth: (cm) 0.1 Area: (cm) 0.094 Volume: (cm) 0.009 % Reduction in Area: % Reduction in Volume: Epithelialization: Small (1-33%) Tunneling: No Undermining: No Wound Description Classification: Full Thickness Without Exposed Support Structures Wound Margin: Distinct, outline attached Exudate Amount: Medium Exudate Type: Serous Exudate Color: amber Foul Odor After Cleansing: No Slough/Fibrino Yes Wound Bed Granulation Amount: Medium (34-66%) Exposed Structure Granulation Quality: Pink Fascia Exposed: No Necrotic Amount: Medium (34-66%) Fat Layer (Subcutaneous Tissue) Exposed: Yes Necrotic Quality: Adherent Slough Tendon Exposed: No Muscle Exposed: No Joint Exposed: No Bone Exposed: No Periwound Skin Texture Texture Color No Abnormalities Noted: No No  Abnormalities Noted: Yes Callus: Yes Moisture No Abnormalities Noted: No Dry / Scaly: Yes Electronic Signature(s) Signed: 03/27/2022 3:48:53 PM By: Samuella Bruin Entered By: Samuella Bruin on 03/27/2022 14:08:56 -------------------------------------------------------------------------------- Vitals Details Patient Name: Date of Service: [name] [name], [name] E.  03/27/2022 1:15 PM Medical Record Number: [id number] Patient Account Number: [id number] Date of Birth/Sex: Treating RN: [birth year] (89 y.o. Fredderick Phenix Primary Care Claudius Mich: Syliva Overman Other Clinician: Referring Muaaz Brau: Treating Jabaree Mercado/Extender: Standley Dakins in Treatment: 0 Vital Signs Time Taken: 13:50 Temperature (F): 97.8 Height (in): 66 Pulse (bpm): 88 Source: Stated Respiratory Rate (breaths/min): 16 Weight (lbs): 171 Blood Pressure (mmHg): 160/77 Source: Stated Reference Range: 80 - 120 mg / dl Body Mass Index (BMI): 27.6 Electronic Signature(s) Kovacik, Verginia E ([id number]) [id number] Page 8 of 8 Signed: 03/27/2022 3:48:53 PM By: Samuella Bruin Entered By: Samuella Bruin on 03/27/2022 13:50:22

## 2022-04-01 ENCOUNTER — Emergency Department (HOSPITAL_COMMUNITY)
Admission: EM | Admit: 2022-04-01 | Discharge: 2022-04-01 | Disposition: A | Discharge status: Home / Self Care | Payer: Medicare HMO | Attending: Emergency Medicine | Admitting: Emergency Medicine

## 2022-04-01 ENCOUNTER — Other Ambulatory Visit: Payer: Self-pay

## 2022-04-01 ENCOUNTER — Emergency Department (HOSPITAL_COMMUNITY): Payer: Medicare HMO

## 2022-04-01 ENCOUNTER — Encounter (HOSPITAL_COMMUNITY): Payer: Self-pay | Admitting: *Deleted

## 2022-04-01 DIAGNOSIS — Z79899 Other long term (current) drug therapy: Secondary | ICD-10-CM | POA: Diagnosis not present

## 2022-04-01 DIAGNOSIS — L03115 Cellulitis of right lower limb: Secondary | ICD-10-CM | POA: Diagnosis not present

## 2022-04-01 DIAGNOSIS — R600 Localized edema: Secondary | ICD-10-CM | POA: Diagnosis not present

## 2022-04-01 DIAGNOSIS — I10 Essential (primary) hypertension: Secondary | ICD-10-CM | POA: Diagnosis not present

## 2022-04-01 DIAGNOSIS — Z8542 Personal history of malignant neoplasm of other parts of uterus: Secondary | ICD-10-CM | POA: Insufficient documentation

## 2022-04-01 DIAGNOSIS — Z7901 Long term (current) use of anticoagulants: Secondary | ICD-10-CM | POA: Insufficient documentation

## 2022-04-01 DIAGNOSIS — M79604 Pain in right leg: Secondary | ICD-10-CM | POA: Diagnosis present

## 2022-04-01 LAB — CBC WITH DIFFERENTIAL/PLATELET
Abs Immature Granulocytes: 0.06 10*3/uL (ref 0.00–0.07)
Basophils Absolute: 0 10*3/uL (ref 0.0–0.1)
Basophils Relative: 0 %
Eosinophils Absolute: 0.3 10*3/uL (ref 0.0–0.5)
Eosinophils Relative: 3 %
HCT: 35.2 % — ABNORMAL LOW (ref 36.0–46.0)
Hemoglobin: 10.6 g/dL — ABNORMAL LOW (ref 12.0–15.0)
Immature Granulocytes: 1 %
Lymphocytes Relative: 8 %
Lymphs Abs: 0.7 10*3/uL (ref 0.7–4.0)
MCH: 30.6 pg (ref 26.0–34.0)
MCHC: 30.1 g/dL (ref 30.0–36.0)
MCV: 101.7 fL — ABNORMAL HIGH (ref 80.0–100.0)
Monocytes Absolute: 0.9 10*3/uL (ref 0.1–1.0)
Monocytes Relative: 10 %
Neutro Abs: 7.1 10*3/uL (ref 1.7–7.7)
Neutrophils Relative %: 78 %
Platelets: 215 10*3/uL (ref 150–400)
RBC: 3.46 MIL/uL — ABNORMAL LOW (ref 3.87–5.11)
RDW: 14.6 % (ref 11.5–15.5)
WBC: 9.1 10*3/uL (ref 4.0–10.5)
nRBC: 0 % (ref 0.0–0.2)

## 2022-04-01 LAB — BASIC METABOLIC PANEL
Anion gap: 7 (ref 5–15)
BUN: 20 mg/dL (ref 8–23)
CO2: 27 mmol/L (ref 22–32)
Calcium: 8.7 mg/dL — ABNORMAL LOW (ref 8.9–10.3)
Chloride: 108 mmol/L (ref 98–111)
Creatinine, Ser: 0.82 mg/dL (ref 0.44–1.00)
GFR, Estimated: >60 mL/min (ref >60)
Glucose, Bld: 84 mg/dL (ref 70–99)
Potassium: 3.4 mmol/L — ABNORMAL LOW (ref 3.5–5.1)
Sodium: 142 mmol/L (ref 135–145)

## 2022-04-01 MED ORDER — APIXABAN 5 MG PO TABS
5.0000 mg | ORAL_TABLET | Freq: Once | ORAL | Status: AC
  Administered 2022-04-01: 5 mg via ORAL
  Filled 2022-04-01: qty 1

## 2022-04-01 MED ORDER — DOXYCYCLINE HYCLATE 100 MG PO CAPS: 100.0000 mg | ORAL_CAPSULE | Freq: Two times a day (BID) | ORAL | 0 refills | Status: DC

## 2022-04-01 MED ORDER — DOXYCYCLINE HYCLATE 100 MG PO TABS
100.0000 mg | ORAL_TABLET | Freq: Once | ORAL | Status: AC
  Administered 2022-04-01: 100 mg via ORAL
  Filled 2022-04-01: qty 1

## 2022-04-01 NOTE — ED Provider Notes (Signed)
[facility] EMERGENCY DEPARTMENT Provider Note   CSN: [id number] Arrival date & time: 04/01/22  1222     History  Chief Complaint  Patient presents with   Leg Pain    Right     [name] [name] is a 89 y.o. female.  Pt is a 89 yo female with a pmhx significant for htn, hld, uterine cancer, arthritis, spinal stenosis, peripheral edema, and  dvt in left leg on Eliquis.  Pt wa scheduled for cataract surgery today.  The pt mentioned she's been having increased pain and swelling in the right leg.  Surgery was canceled and pt sent to the ED for dvt eval.  Pt denies any fevers.  She does get her legs wrapped twice a week due to hx peripheral edema and is due for them to be wrapped tomorrow.  The only Eliquis dose missed was this am because of her planned cataract surgery.       Home Medications Prior to Admission medications   Medication Sig Start Date End Date Taking? Authorizing Provider  doxycycline (VIBRAMYCIN) 100 MG capsule Take 1 capsule (100 mg total) by mouth 2 (two) times daily. 04/01/22  Yes Isla Pence, MD  acetaminophen (TYLENOL) 650 MG CR tablet Take 650 mg by mouth every 6 (six) hours. 08/08/19   [provider]  Amino Acids-Protein Hydrolys (FEEDING SUPPLEMENT, PRO-STAT SUGAR FREE 64,) LIQD Take 30 mLs by mouth 3 (three) times daily with meals. 15-100 gram-kcal/30 mL Special Instructions: 09-22-21: albumin 2.6; may mix with water or juice    [provider]  cholecalciferol (VITAMIN D3) 25 MCG (1000 UNIT) tablet Take 1 tablet (1,000 Units total) by mouth daily. 04/16/21   Harriett Rush, PA-C  dextromethorphan-guaiFENesin (MUCINEX DM) 30-600 MG 12hr tablet Take 1 tablet by mouth 2 (two) times daily as needed for up to 20 doses for cough. 02/25/22   Johnette Abraham, MD  ELIQUIS 5 MG TABS tablet TAKE 1 TABLET TWICE DAILY 11/25/21   Derek Jack, MD  ezetimibe (ZETIA) 10 MG tablet TAKE 1 TABLET EVERY DAY 03/26/22   Fayrene Helper, MD  feeding  supplement, GLUCERNA SHAKE, (GLUCERNA SHAKE) LIQD Take 237 mLs by mouth 3 (three) times daily between meals. 09/24/21   Barton Dubois, MD  folic acid (FOLVITE) 1 MG tablet Take 1 tablet (1 mg total) by mouth daily. 10/09/21   Gerlene Fee, NP  furosemide (LASIX) 20 MG tablet Take 1 tablet (20 mg total) by mouth daily. 10/09/21   Gerlene Fee, NP  gabapentin (NEURONTIN) 100 MG capsule Take 1 capsule (100 mg total) by mouth daily. 2 caps = '200mg'$ ; oral; At Bedtime; 08:00 PM [DX: Hereditary and idiopathic neuropathy, unspecified] 10/09/21   Gerlene Fee, NP  magnesium oxide (MAG-OX) 400 (240 Mg) MG tablet TAKE 1 TABLET TWICE DAILY 11/27/21   Derek Jack, MD  methotrexate 2.5 MG tablet Take 5 tablets (12.5 mg total) by mouth once a week. Caution:Chemotherapy. Protect from light. 12/25/21   Suzzanne Cloud, NP  nystatin (MYCOSTATIN/NYSTOP) powder Apply topically 2 (two) times daily. 09/24/21   Barton Dubois, MD  predniSONE (DELTASONE) 5 MG tablet Take 2 tablets (10 mg total) by mouth daily with breakfast. 12/25/21   Suzzanne Cloud, NP      Allergies    Cellcept [mycophenolate mofetil], Statins, Ciprofloxacin hcl, Hydrocodone, and Penicillins    Review of Systems   Review of Systems  Musculoskeletal:        Right leg pain and swelling  All other systems reviewed and are negative.   Physical Exam Updated Vital Signs BP 137/64 (BP Location: Left Arm)   Pulse 97   Temp 97.8 F (36.6 C) (Oral)   Resp 18   Ht '5\' 5"'$  (1.651 m)   Wt 73.9 kg   SpO2 90%   BMI 27.12 kg/m  Physical Exam Vitals and nursing note reviewed.  Constitutional:      Appearance: Normal appearance.  HENT:     Head: Normocephalic and atraumatic.     Right Ear: External ear normal.     Left Ear: External ear normal.     Mouth/Throat:     Mouth: Mucous membranes are dry.  Eyes:     Extraocular Movements: Extraocular movements intact.     Conjunctiva/sclera: Conjunctivae normal.     Pupils: Pupils are equal,  round, and reactive to light.  Cardiovascular:     Rate and Rhythm: Normal rate and regular rhythm.     Pulses: Normal pulses.     Heart sounds: Normal heart sounds.  Pulmonary:     Effort: Pulmonary effort is normal.     Breath sounds: Normal breath sounds.  Abdominal:     General: Abdomen is flat. Bowel sounds are normal.  Musculoskeletal:     Cervical back: Normal range of motion and neck supple.     Right lower leg: Edema present.     Left lower leg: Edema present.     Comments: Right leg slightly more swollen than the left.  It is also warmer to touch.  Skin:    General: Skin is warm.     Capillary Refill: Capillary refill takes less than 2 seconds.  Neurological:     General: No focal deficit present.     Mental Status: She is alert.  Psychiatric:        Mood and Affect: Mood normal.        Behavior: Behavior normal.     ED Results / Procedures / Treatments   Labs (all labs ordered are listed, but only abnormal results are displayed) Labs Reviewed  BASIC METABOLIC PANEL - Abnormal; Notable for the following components:      Result Value   Potassium 3.4 (*)    Calcium 8.7 (*)    All other components within normal limits  CBC WITH DIFFERENTIAL/PLATELET - Abnormal; Notable for the following components:   RBC 3.46 (*)    Hemoglobin 10.6 (*)    HCT 35.2 (*)    MCV 101.7 (*)    All other components within normal limits    EKG None  Radiology US Venous Img Lower Bilateral (DVT)  Result Date: 04/01/2022 CLINICAL DATA:  Bilateral lower extremity swelling with worsening in the past 2 days. EXAM: Bilateral LOWER EXTREMITY VENOUS DOPPLER ULTRASOUND TECHNIQUE: Gray-scale sonography with compression, as well as color and duplex ultrasound, were performed to evaluate the deep venous system(s) from the level of the common femoral vein through the popliteal and proximal calf veins. COMPARISON:  03/08/2020. FINDINGS: VENOUS Normal compressibility of the common femoral,  superficial femoral, and popliteal veins, as well as the visualized calf veins. Visualized portions of profunda femoral vein and great saphenous vein unremarkable. No filling defects to suggest DVT on grayscale or color Doppler imaging. Doppler waveforms show normal direction of venous flow, normal respiratory plasticity and response to augmentation. Limited views of the contralateral common femoral vein are unremarkable. Bilateral lower extremity edema. OTHER Negative for deep venous thrombosis in the lower extremities bilaterally. Limitations:  none IMPRESSION: Negative. Electronically Signed   By: Lorin Picket M.D.   On: 04/01/2022 14:03    Procedures Procedures    Medications Ordered in ED Medications  doxycycline (VIBRA-TABS) tablet 100 mg (has no administration in time range)  apixaban (ELIQUIS) tablet 5 mg (has no administration in time range)    ED Course/ Medical Decision Making/ A&P                           Medical Decision Making Amount and/or Complexity of Data Reviewed Labs: ordered.  Risk Prescription drug management.   This patient presents to the ED for concern of right leg pain, this involves an extensive number of treatment options, and is a complaint that carries with it a high risk of complications and morbidity.  The differential diagnosis includes dvt, cellulitis, edema   Co morbidities that complicate the patient evaluation  htn, hld, uterine cancer, arthritis, spinal stenosis, peripheral edema, and  dvt in left leg on Eliquis.   Additional history obtained:  Additional history obtained from epic chart review External records from outside source obtained and reviewed including family   Lab Tests:  I Ordered, and personally interpreted labs.  The pertinent results include:  cbc with hgb 10.6  (hgb 12.1 in October, but it was 10.8 in July); bmp nl   Imaging Studies ordered:  I ordered imaging studies including [place]  I independently visualized and  interpreted imaging which showed  [place] Leg: Negative.  I agree with the radiologist interpretation   Cardiac Monitoring:  The patient was maintained on a cardiac monitor.  I personally viewed and interpreted the cardiac monitored which showed an underlying rhythm of: nsr   Medicines ordered and prescription drug management:  I ordered medication including eliquis and doxy  for hx dvt and cellulitis  Reevaluation of the patient after these medicines showed that the patient improved I have reviewed the patients home medicines and have made adjustments as needed   Test Considered:  [place]  Problem List / ED Course:  Cellulitis:  No DVT.  Pt will be started on doxy as she is pcn allergic.  She is stable for d/c.  Return if worse.    Reevaluation:  After the interventions noted above, I reevaluated the patient and found that they have :improved   Social Determinants of Health:  Lives at home   Dispostion:  After consideration of the diagnostic results and the patients response to treatment, I feel that the patent would benefit from discharge with outpatient f/u.          Final Clinical Impression(s) / ED Diagnoses Final diagnoses:  Cellulitis of right lower extremity    Rx / DC Orders ED Discharge Orders          Ordered    doxycycline (VIBRAMYCIN) 100 MG capsule  2 times daily        04/01/22 1427              Isla Pence, MD 04/01/22 1430

## 2022-04-01 NOTE — ED Notes (Addendum)
Ble swelling. Gets them both wrapped twice a week due to swelling, blisters, decreased circulation. States right leg has swollen more over past 2 days. 4plus pitting edema to both legs. Right left warm to touch and left leg cool to touch. Small open wound noted to tip of right great toe as well. Pt a/o. Denies sob. NAD

## 2022-04-01 NOTE — ED Notes (Signed)
US in with pt. 

## 2022-04-01 NOTE — ED Triage Notes (Signed)
Pt with right leg pain x 2 days. Hx of left leg DVT and currently on Eliquis.

## 2022-04-03 DIAGNOSIS — I10 Essential (primary) hypertension: Secondary | ICD-10-CM | POA: Diagnosis not present

## 2022-04-03 DIAGNOSIS — E785 Hyperlipidemia, unspecified: Secondary | ICD-10-CM | POA: Diagnosis not present

## 2022-04-03 DIAGNOSIS — R7303 Prediabetes: Secondary | ICD-10-CM | POA: Diagnosis not present

## 2022-04-04 LAB — CMP14+EGFR
ALT: 20 IU/L (ref 0–32)
AST: 24 IU/L (ref 0–40)
Albumin/Globulin Ratio: 1.8 (ref 1.2–2.2)
Albumin: 3.8 g/dL (ref 3.7–4.7)
Alkaline Phosphatase: 59 IU/L (ref 44–121)
BUN/Creatinine Ratio: 21 (ref 12–28)
BUN: 17 mg/dL (ref 8–27)
Bilirubin Total: 0.8 mg/dL (ref 0.0–1.2)
CO2: 23 mmol/L (ref 20–29)
Calcium: 9.2 mg/dL (ref 8.7–10.3)
Chloride: 106 mmol/L (ref 96–106)
Creatinine, Ser: 0.82 mg/dL (ref 0.57–1.00)
Globulin, Total: 2.1 g/dL (ref 1.5–4.5)
Glucose: 108 mg/dL — ABNORMAL HIGH (ref 70–99)
Potassium: 3.4 mmol/L — ABNORMAL LOW (ref 3.5–5.2)
Sodium: 147 mmol/L — ABNORMAL HIGH (ref 134–144)
Total Protein: 5.9 g/dL — ABNORMAL LOW (ref 6.0–8.5)
eGFR: 69 mL/min/{1.73_m2} (ref >59)

## 2022-04-04 LAB — LIPID PANEL
Chol/HDL Ratio: 1.8 ratio (ref 0.0–4.4)
Cholesterol, Total: 197 mg/dL (ref 100–199)
HDL: 107 mg/dL (ref >39)
LDL Chol Calc (NIH): 76 mg/dL (ref 0–99)
Triglycerides: 77 mg/dL (ref 0–149)
VLDL Cholesterol Cal: 14 mg/dL (ref 5–40)

## 2022-04-04 LAB — HEMOGLOBIN A1C
Est. average glucose Bld gHb Est-mCnc: 114 mg/dL
Hgb A1c MFr Bld: 5.6 % (ref 4.8–5.6)

## 2022-04-06 ENCOUNTER — Telehealth: Payer: Self-pay | Admitting: Family Medicine

## 2022-04-06 ENCOUNTER — Emergency Department (HOSPITAL_COMMUNITY): Payer: Medicare HMO

## 2022-04-06 ENCOUNTER — Encounter (HOSPITAL_COMMUNITY): Payer: Self-pay | Admitting: Emergency Medicine

## 2022-04-06 ENCOUNTER — Emergency Department (HOSPITAL_COMMUNITY)
Admission: EM | Admit: 2022-04-06 | Discharge: 2022-04-08 | Disposition: A | Discharge status: Home / Self Care | Payer: Medicare HMO | Attending: Emergency Medicine | Admitting: Emergency Medicine

## 2022-04-06 ENCOUNTER — Other Ambulatory Visit: Payer: Self-pay

## 2022-04-06 DIAGNOSIS — Z789 Other specified health status: Secondary | ICD-10-CM

## 2022-04-06 DIAGNOSIS — I10 Essential (primary) hypertension: Secondary | ICD-10-CM | POA: Diagnosis not present

## 2022-04-06 DIAGNOSIS — Z7901 Long term (current) use of anticoagulants: Secondary | ICD-10-CM | POA: Diagnosis not present

## 2022-04-06 DIAGNOSIS — R531 Weakness: Secondary | ICD-10-CM | POA: Insufficient documentation

## 2022-04-06 DIAGNOSIS — M25551 Pain in right hip: Secondary | ICD-10-CM

## 2022-04-06 DIAGNOSIS — M79604 Pain in right leg: Secondary | ICD-10-CM | POA: Diagnosis not present

## 2022-04-06 LAB — CBC WITH DIFFERENTIAL/PLATELET
Abs Immature Granulocytes: 0.04 10*3/uL (ref 0.00–0.07)
Basophils Absolute: 0 10*3/uL (ref 0.0–0.1)
Basophils Relative: 0 %
Eosinophils Absolute: 0.2 10*3/uL (ref 0.0–0.5)
Eosinophils Relative: 3 %
HCT: 34.2 % — ABNORMAL LOW (ref 36.0–46.0)
Hemoglobin: 10.2 g/dL — ABNORMAL LOW (ref 12.0–15.0)
Immature Granulocytes: 1 %
Lymphocytes Relative: 9 %
Lymphs Abs: 0.8 10*3/uL (ref 0.7–4.0)
MCH: 29.8 pg (ref 26.0–34.0)
MCHC: 29.8 g/dL — ABNORMAL LOW (ref 30.0–36.0)
MCV: 100 fL (ref 80.0–100.0)
Monocytes Absolute: 0.5 10*3/uL (ref 0.1–1.0)
Monocytes Relative: 6 %
Neutro Abs: 7 10*3/uL (ref 1.7–7.7)
Neutrophils Relative %: 81 %
Platelets: 274 10*3/uL (ref 150–400)
RBC: 3.42 MIL/uL — ABNORMAL LOW (ref 3.87–5.11)
RDW: 14.5 % (ref 11.5–15.5)
WBC: 8.6 10*3/uL (ref 4.0–10.5)
nRBC: 0 % (ref 0.0–0.2)

## 2022-04-06 LAB — COMPREHENSIVE METABOLIC PANEL
ALT: 20 U/L (ref 0–44)
AST: 26 U/L (ref 15–41)
Albumin: 3.3 g/dL — ABNORMAL LOW (ref 3.5–5.0)
Alkaline Phosphatase: 47 U/L (ref 38–126)
Anion gap: 11 (ref 5–15)
BUN: 24 mg/dL — ABNORMAL HIGH (ref 8–23)
CO2: 26 mmol/L (ref 22–32)
Calcium: 8.8 mg/dL — ABNORMAL LOW (ref 8.9–10.3)
Chloride: 107 mmol/L (ref 98–111)
Creatinine, Ser: 0.79 mg/dL (ref 0.44–1.00)
GFR, Estimated: >60 mL/min (ref >60)
Glucose, Bld: 107 mg/dL — ABNORMAL HIGH (ref 70–99)
Potassium: 3.2 mmol/L — ABNORMAL LOW (ref 3.5–5.1)
Sodium: 144 mmol/L (ref 135–145)
Total Bilirubin: 1.1 mg/dL (ref 0.3–1.2)
Total Protein: 6.1 g/dL — ABNORMAL LOW (ref 6.5–8.1)

## 2022-04-06 MED ORDER — POTASSIUM CHLORIDE CRYS ER 20 MEQ PO TBCR
40.0000 meq | EXTENDED_RELEASE_TABLET | Freq: Once | ORAL | Status: AC
  Administered 2022-04-06: 40 meq via ORAL
  Filled 2022-04-06: qty 2

## 2022-04-06 NOTE — ED Triage Notes (Signed)
Pt BIB [facility] for right hip pain, seen at this ED for same leg on 04/01/22, per pt since visit has not been able to move leg, felt pop in right hip when sitting down.

## 2022-04-06 NOTE — ED Notes (Signed)
Pt states she is having trouble bearing weight on right leg more so than usually after she started having pain and weakness after hearing a loud pop in her right hip.

## 2022-04-06 NOTE — Telephone Encounter (Signed)
[name] [name], [phone]  Called stating when pt was getting up she heard something pop in her rt upper thigh & is now unable to walk on her leg. Per Inez Catalina she has called EMS. Just wanting to notify provider.

## 2022-04-06 NOTE — Social Work (Signed)
Chart has been reviewed CSW has reached out to the provider. At this time the patient is awaiting a PT evaluation, once that is complete TOC will reach out to the patient for next steps. TOC will continue to follow.

## 2022-04-06 NOTE — ED Provider Triage Note (Signed)
Emergency Medicine Provider Triage Evaluation Note  [name] [name] , a 89 y.o. female  was evaluated in triage.  Pt complains of right hip pain.  Started 3 days ago.  Denies trauma.  Noticed she was having difficulty getting out of bed due to pain and limited mobility in her right hip.  Also mention that her right leg feels "out of place".  Denies fever and chills.  States she was also recently diagnosed with cellulitis in the right foot.  Review of Systems  Positive: See above Negative: See above  Physical Exam  Ht '5\' 6"'$  (1.676 m)   Wt 73.9 kg   BMI 26.31 kg/m  Gen:   Awake, no distress   Resp:  Normal effort  MSK:   Moving all extremities, limited range of motion of the right hip Other:    Medical Decision Making  Medically screening exam initiated at 2:59 PM.  Appropriate orders placed.  [name] [name] was informed that the remainder of the evaluation will be completed by another provider, this initial triage assessment does not replace that evaluation, and the importance of remaining in the ED until their evaluation is complete.  Work up started   [name] Pho, PA-C 04/06/22 1504

## 2022-04-06 NOTE — ED Provider Notes (Signed)
[facility] EMERGENCY DEPARTMENT Provider Note   CSN: [id number] Arrival date & time: 04/06/22  1147     History  Chief Complaint  Patient presents with   Hip Pain    MAHI ZABRISKIE is a 89 y.o. female.  HPI 89 year old female presents with right hip pain/weakness.  Started about 3 days ago.  She felt a pop in her right hip.  No specific trauma otherwise.  Her leg has felt heavier though at baseline she has bilateral leg weakness and bilateral Unna boots.  She has been wheelchair-bound for over a year.  She is usually able to slide from her wheelchair to her bed and back and forth but over the last 24+ hours she has been unable to do that.  She has an aide that comes in the morning but no one at night.  Family is present at the bedside and is worried that she is unable to care for self at home and the daughter herself cannot lift her up by herself.  They feel like she is unable to go home and care for herself currently.  Otherwise, recently had a negative DVT study on her right leg 5 days ago, and was placed on antibiotics for her foot.  Home Medications Prior to Admission medications   Medication Sig Start Date End Date Taking? Authorizing Provider  prednisoLONE acetate (PRED FORTE) 1 % ophthalmic suspension Place into the left eye. 03/25/22  Yes [provider]  acetaminophen (TYLENOL) 650 MG CR tablet Take 650 mg by mouth every 6 (six) hours. 08/08/19   [provider]  Amino Acids-Protein Hydrolys (FEEDING SUPPLEMENT, PRO-STAT SUGAR FREE 64,) LIQD Take 30 mLs by mouth 3 (three) times daily with meals. 15-100 gram-kcal/30 mL Special Instructions: 09-22-21: albumin 2.6; may mix with water or juice    [provider]  cholecalciferol (VITAMIN D3) 25 MCG (1000 UNIT) tablet Take 1 tablet (1,000 Units total) by mouth daily. 04/16/21   Harriett Rush, PA-C  dextromethorphan-guaiFENesin (MUCINEX DM) 30-600 MG 12hr tablet Take 1 tablet by mouth 2 (two) times daily  as needed for up to 20 doses for cough. 02/25/22   Johnette Abraham, MD  doxycycline (VIBRAMYCIN) 100 MG capsule Take 1 capsule (100 mg total) by mouth 2 (two) times daily. 04/01/22   Isla Pence, MD  ELIQUIS 5 MG TABS tablet TAKE 1 TABLET TWICE DAILY 11/25/21   Derek Jack, MD  ezetimibe (ZETIA) 10 MG tablet TAKE 1 TABLET EVERY DAY 03/26/22   Fayrene Helper, MD  feeding supplement, GLUCERNA SHAKE, (GLUCERNA SHAKE) LIQD Take 237 mLs by mouth 3 (three) times daily between meals. 09/24/21   Barton Dubois, MD  folic acid (FOLVITE) 1 MG tablet Take 1 tablet (1 mg total) by mouth daily. 10/09/21   Gerlene Fee, NP  furosemide (LASIX) 20 MG tablet Take 1 tablet (20 mg total) by mouth daily. 10/09/21   Gerlene Fee, NP  gabapentin (NEURONTIN) 100 MG capsule Take 1 capsule (100 mg total) by mouth daily. 2 caps = '200mg'$ ; oral; At Bedtime; 08:00 PM [DX: Hereditary and idiopathic neuropathy, unspecified] 10/09/21   Gerlene Fee, NP  magnesium oxide (MAG-OX) 400 (240 Mg) MG tablet TAKE 1 TABLET TWICE DAILY 11/27/21   Derek Jack, MD  methotrexate 2.5 MG tablet Take 5 tablets (12.5 mg total) by mouth once a week. Caution:Chemotherapy. Protect from light. 12/25/21   Suzzanne Cloud, NP  nystatin (MYCOSTATIN/NYSTOP) powder Apply topically 2 (two) times daily. 09/24/21  Barton Dubois, MD  predniSONE (DELTASONE) 5 MG tablet Take 2 tablets (10 mg total) by mouth daily with breakfast. 12/25/21   Suzzanne Cloud, NP      Allergies    Cellcept [mycophenolate mofetil], Statins, Ciprofloxacin hcl, Hydrocodone, and Penicillins    Review of Systems   Review of Systems  Constitutional:  Negative for fever.  Musculoskeletal:  Positive for arthralgias.  Neurological:  Positive for weakness.    Physical Exam Updated Vital Signs BP 108/79 (BP Location: Left Arm)   Pulse 61   Temp 98 F (36.7 C) (Oral)   Resp 20   Ht '5\' 6"'$  (1.676 m)   Wt 73.9 kg   SpO2 100%   BMI 26.31 kg/m  Physical  Exam Vitals and nursing note reviewed.  Constitutional:      Appearance: She is well-developed.  HENT:     Head: Normocephalic and atraumatic.  Cardiovascular:     Comments: Right Popliteal Artery auscultated with doppler Pulmonary:     Effort: Pulmonary effort is normal.  Abdominal:     General: There is no distension.  Musculoskeletal:     Right hip: No tenderness.     Right upper leg: No tenderness.     Comments: Patient has weakness in both legs bilaterally. She is able to minimally move them on the stretcher, but seem equal to me. No pain elicited when I passively range her right hip/knee.  Skin:    General: Skin is warm and dry.  Neurological:     Mental Status: She is alert.     ED Results / Procedures / Treatments   Labs (all labs ordered are listed, but only abnormal results are displayed) Labs Reviewed  COMPREHENSIVE METABOLIC PANEL - Abnormal; Notable for the following components:      Result Value   Potassium 3.2 (*)    Glucose, Bld 107 (*)    BUN 24 (*)    Calcium 8.8 (*)    Total Protein 6.1 (*)    Albumin 3.3 (*)    All other components within normal limits  CBC WITH DIFFERENTIAL/PLATELET - Abnormal; Notable for the following components:   RBC 3.42 (*)    Hemoglobin 10.2 (*)    HCT 34.2 (*)    MCHC 29.8 (*)    All other components within normal limits    EKG None  Radiology CT Hip Right Wo Contrast  Result Date: 04/06/2022 CLINICAL DATA:  Right hip pain EXAM: CT OF THE RIGHT HIP WITHOUT CONTRAST TECHNIQUE: Multidetector CT imaging of the right hip was performed according to the standard protocol. Multiplanar CT image reconstructions were also generated. RADIATION DOSE REDUCTION: This exam was performed according to the departmental dose-optimization program which includes automated exposure control, adjustment of the mA and/or kV according to patient size and/or use of iterative reconstruction technique. COMPARISON:  X-ray 04/06/2022 and older. Old  abdomen pelvis CT from December 2020 FINDINGS: Bones/Joint/Cartilage Concentric joint space loss of the right hip with some small osteophytes and some sclerosis. Underlying osteopenia. Chondrocalcinosis identified about the hip. No fracture or dislocation. Note is also made of some hypertrophic changes along the pubic symphysis at the edge of the imaging field. Skeletal hyperostosis identified. Ligaments Suboptimally assessed by CT. Muscles and Tendons Areas of fatty atrophy of the muscles of the proximal thigh, gluteal muscles and iliopsoas. Soft tissues Slight anasarca. No fluid collections. No lymph node enlargement seen in the right hip inguinal regions. Scattered vascular calcifications are identified. IMPRESSION: Degenerative changes with  chondrocalcinosis. Fatty muscle atrophy. Electronically Signed   By: Jill Side M.D.   On: 04/06/2022 15:44   DG Hip Unilat  With Pelvis 2-3 Views Right  Result Date: 04/06/2022 CLINICAL DATA:  Right hip pain. EXAM: DG HIP (WITH OR WITHOUT PELVIS) 2-3V RIGHT COMPARISON:  None Available. FINDINGS: Examination is degraded due to obliquity. No definite displaced fracture. No dislocation. Mild degenerative change of the right hip with joint space loss, subchondral sclerosis and osteophytosis. No evidence of avascular necrosis. Note is made of a small right os acetabuli. Limited visualization of the pelvis demonstrates degenerative change of the pubic symphysis. Limited visualization of the contralateral left hip is normal given obliquity. Degenerative change of the lower lumbar spine is suspected though incompletely evaluated. Vascular calcifications overlie the lower pelvis bilaterally. Phleboliths overlie the lower pelvis. No radiopaque foreign body. IMPRESSION: 1. Osteopenia without definitive displaced fracture. Given osteopenia and difficulty with patient positioning, if there is persistent clinical concern for a hip fracture, further evaluation with CT could be  performed as indicated. 2. Mild degenerative change of the right hip. Electronically Signed   By: Sandi Mariscal M.D.   On: 04/06/2022 13:36    Procedures Procedures    Medications Ordered in ED Medications  potassium chloride SA (KLOR-CON M) CR tablet 40 mEq (40 mEq Oral Given 04/06/22 1813)    ED Course/ Medical Decision Making/ A&P                             Medical Decision Making Amount and/or Complexity of Data Reviewed Labs:     Details: Normal WBC.  Chronic anemia.  Mild hypokalemia. Radiology: ordered and independent interpretation performed.    Details: X-ray and CT do not show any obvious hip fracture/dislocation.  Risk Prescription drug management.   Patient presents with right hip pain. No trauma, though she felt a "pop". CT/Xray unremarkable. No fracture/dislocation.  She feels like her leg is "weak" but I do not appreciate any weakness.  Thus I think neurovascular emergency such as stroke, arterial occlusion, etc. is all unlikely.  She has a strong popliteal pulse.  Unfortunately, based on family and patient, she is unable to take care of herself and does not have adequate help at home.  No family is able to take care of her tonight.  Unfortunately she will have to board in the emergency department.  Patient and family are both unclear on what exactly she is taking medication wise and so I think she will need an official medication reconciliation in the morning and then start her home meds.  TOC has been consulted and a PT evaluation has been ordered.        Final Clinical Impression(s) / ED Diagnoses Final diagnoses:  Right hip pain    Rx / DC Orders ED Discharge Orders     None         Sherwood Gambler, MD 04/06/22 2316

## 2022-04-07 MED ORDER — DOXYCYCLINE HYCLATE 100 MG PO TABS
100.0000 mg | ORAL_TABLET | Freq: Two times a day (BID) | ORAL | Status: DC
  Administered 2022-04-07 – 2022-04-08 (×2): 100 mg via ORAL
  Filled 2022-04-07 (×2): qty 1

## 2022-04-07 MED ORDER — GABAPENTIN 100 MG PO CAPS
100.0000 mg | ORAL_CAPSULE | Freq: Three times a day (TID) | ORAL | Status: DC
  Administered 2022-04-07 – 2022-04-08 (×3): 100 mg via ORAL
  Filled 2022-04-07 (×3): qty 1

## 2022-04-07 MED ORDER — ACETAMINOPHEN 325 MG PO TABS
650.0000 mg | ORAL_TABLET | Freq: Four times a day (QID) | ORAL | Status: DC
  Administered 2022-04-07 – 2022-04-08 (×3): 650 mg via ORAL
  Filled 2022-04-07 (×3): qty 2

## 2022-04-07 MED ORDER — MAGNESIUM OXIDE -MG SUPPLEMENT 400 (240 MG) MG PO TABS
400.0000 mg | ORAL_TABLET | Freq: Two times a day (BID) | ORAL | Status: DC
  Administered 2022-04-07 – 2022-04-08 (×3): 400 mg via ORAL
  Filled 2022-04-07 (×3): qty 1

## 2022-04-07 MED ORDER — PREDNISONE 10 MG PO TABS
10.0000 mg | ORAL_TABLET | Freq: Every day | ORAL | Status: DC
  Administered 2022-04-07 – 2022-04-08 (×2): 10 mg via ORAL
  Filled 2022-04-07 (×2): qty 1

## 2022-04-07 MED ORDER — EZETIMIBE 10 MG PO TABS
10.0000 mg | ORAL_TABLET | Freq: Every day | ORAL | Status: DC
  Administered 2022-04-07 – 2022-04-08 (×2): 10 mg via ORAL
  Filled 2022-04-07 (×2): qty 1

## 2022-04-07 MED ORDER — VITAMIN D 25 MCG (1000 UNIT) PO TABS
1000.0000 [IU] | ORAL_TABLET | Freq: Every day | ORAL | Status: DC
  Administered 2022-04-07 – 2022-04-08 (×2): 1000 [IU] via ORAL
  Filled 2022-04-07 (×2): qty 1

## 2022-04-07 MED ORDER — APIXABAN 5 MG PO TABS
5.0000 mg | ORAL_TABLET | Freq: Two times a day (BID) | ORAL | Status: DC
  Administered 2022-04-07 – 2022-04-08 (×2): 5 mg via ORAL
  Filled 2022-04-07 (×2): qty 1

## 2022-04-07 MED ORDER — METHOTREXATE SODIUM 2.5 MG PO TABS
12.5000 mg | ORAL_TABLET | ORAL | Status: DC
  Administered 2022-04-08: 12.5 mg via ORAL
  Filled 2022-04-07: qty 5

## 2022-04-07 MED ORDER — FOLIC ACID 1 MG PO TABS
1.0000 mg | ORAL_TABLET | Freq: Every day | ORAL | Status: DC
  Administered 2022-04-07 – 2022-04-08 (×2): 1 mg via ORAL
  Filled 2022-04-07 (×2): qty 1

## 2022-04-07 MED ORDER — GLUCERNA SHAKE PO LIQD
237.0000 mL | Freq: Three times a day (TID) | ORAL | Status: DC
  Administered 2022-04-07 – 2022-04-08 (×2): 237 mL via ORAL
  Filled 2022-04-07 (×5): qty 237

## 2022-04-07 NOTE — Plan of Care (Signed)
  Problem: Acute Rehab PT Goals(only PT should resolve) Goal: Pt will Roll Supine to Side Outcome: Progressing Flowsheets (Taken 04/07/2022 1228) Pt will Roll Supine to Side:  with min assist  with mod assist Goal: Pt Will Go Supine/Side To Sit Outcome: Progressing Flowsheets (Taken 04/07/2022 1228) Pt will go Supine/Side to Sit: with minimal assist Goal: Pt Will Go Sit To Supine/Side Outcome: Progressing Flowsheets (Taken 04/07/2022 1228) Pt will go Sit to Supine/Side: with minimal assist Goal: Patient Will Perform Sitting Balance Outcome: Progressing Flowsheets (Taken 04/07/2022 1228) Patient will perform sitting balance:  with modified independence  with supervision Goal: Pt Will Transfer Bed To Chair/Chair To Bed Outcome: Progressing Flowsheets (Taken 04/07/2022 1228) Pt will Transfer Bed to Chair/Chair to Bed: with mod assist   12:29 PM, 04/07/22 Lonell Grandchild, MPT Physical Therapist with [facility] [phone] office [phone] mobile phone

## 2022-04-07 NOTE — NC FL2 (Signed)
[place] LEVEL OF CARE FORM     IDENTIFICATION  Patient Name: [name] [name] Birthdate: Sep 06, 1932 Sex: female Admission Date (Current Location): 04/06/2022  [facility] and [place] Number:  [employer] and Address:  [place] [address], [place]      Provider Number: [id number]  Attending Physician Name and Address:  Default, Provider, MD  Relative Name and Phone Number:       Current Level of Care: Hospital Recommended Level of Care: [place] Prior Approval Number:    Date Approved/Denied:   PASRR Number: [id number]  Discharge Plan: SNF    Current Diagnoses: Patient Active Problem List   Diagnosis Date Noted   Diastolic dysfunction 21/19/4174   Aortic stenosis 01/16/2022   Mitral valve regurgitation 01/16/2022   Encounter for Medicare annual examination with abnormal findings 12/07/2021   Breast lump on right side at 11 o'clock position 11/06/2021   Severe protein-calorie malnutrition ([place]) 09/25/2021   Aortic atherosclerosis ([place]) 09/25/2021   Rhabdomyolysis 09/20/2021   Recurrent falls 09/20/2021   Leukocytosis 09/20/2021   Chronic deep vein thrombosis (DVT) of distal vein of left lower extremity (HCC) 09/20/2021   Elevated troponin 09/20/2021   Osteoarthritis 09/20/2021   Paresthesia 05/05/2021   Gait abnormality 05/05/2021   Dermatitis 05/01/2021   Leg ulcer, left ([place]) 03/25/2021   HZV (herpes zoster virus) post herpetic neuralgia 05/31/2020   Zoster 04/30/2020   Hypomagnesemia 08/11/2019   Lower extremity edema 08/06/2019   Bilateral lower extremity edema 08/04/2019   Hypokalemia 08/04/2019   Hypoalbuminemia 08/04/2019   Venous stasis ulcers (HCC) 04/03/2019   Lower leg edema 03/22/2019   At high risk for falls per [name] fall risk assessment scale 02/09/2019   Neck pain, bilateral posterior 01/13/2019   Statins contraindicated 08/14/2018   Peripheral neuropathy 07/06/2018    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   Myopathy 02/15/2018   Lumbar spinal stenosis 01/14/2018   GERD (gastroesophageal reflux disease) 06/17/2016   Normocytic anemia    Sciatica of left side associated with disorder of lumbosacral spine 11/29/2014   Chronic constipation 10/04/2012   Urinary incontinence 06/20/2012   Vitamin D deficiency 08/22/2011   Prediabetes 05/29/2011   NASH (nonalcoholic steatohepatitis) 11/05/2009   Arthritis of right knee 09/04/2009   Hyperlipidemia LDL goal <100 09/08/2007   Essential hypertension 09/08/2007    Orientation RESPIRATION BLADDER Height & Weight     Self, Time, Situation, Place  Normal Continent Weight: 73.9 kg Height:  '5\' 6"'$  (167.6 cm)  BEHAVIORAL SYMPTOMS/MOOD NEUROLOGICAL BOWEL NUTRITION STATUS      Continent Diet  AMBULATORY STATUS COMMUNICATION OF NEEDS Skin   Extensive Assist Verbally Normal                       Personal Care Assistance Level of Assistance  Bathing, Feeding, Dressing Bathing Assistance: Limited assistance Feeding assistance: Independent Dressing Assistance: Limited assistance     Functional Limitations Info             [place]  PT (By licensed PT), OT (By licensed OT)     PT Frequency: 5x weekly OT Frequency: 5x weekly            Contractures Contractures Info: Not present    Additional Factors Info  Code Status, Allergies Code Status Info: Full Allergies Info: cellcept, statins, ciprofloxacin, hydrocodone, penicillins           Current Medications (04/07/2022):  This is the  current hospital active medication list No current facility-administered medications for this encounter.   Current Outpatient Medications  Medication Sig Dispense Refill   acetaminophen (TYLENOL) 650 MG CR tablet Take 650 mg by mouth every 6 (six) hours.     cholecalciferol (VITAMIN D3) 25 MCG (1000 UNIT) tablet Take 1 tablet (1,000 Units total) by mouth daily. 90 tablet 3   doxycycline  (VIBRAMYCIN) 100 MG capsule Take 1 capsule (100 mg total) by mouth 2 (two) times daily. 14 capsule 0   ELIQUIS 5 MG TABS tablet TAKE 1 TABLET TWICE DAILY 120 tablet 3   ezetimibe (ZETIA) 10 MG tablet TAKE 1 TABLET EVERY DAY 90 tablet 3   feeding supplement, GLUCERNA SHAKE, (GLUCERNA SHAKE) LIQD Take 237 mLs by mouth 3 (three) times daily between meals.     folic acid (FOLVITE) 1 MG tablet Take 1 tablet (1 mg total) by mouth daily. 30 tablet 0   gabapentin (NEURONTIN) 100 MG capsule Take 1 capsule (100 mg total) by mouth daily. 2 caps = '200mg'$ ; oral; At Bedtime; 08:00 PM [DX: Hereditary and idiopathic neuropathy, unspecified] (Patient taking differently: Take 100 mg by mouth 3 (three) times daily. 1 capsule in the morning and 2 capsules at bedtime) 30 capsule 0   methotrexate 2.5 MG tablet Take 5 tablets (12.5 mg total) by mouth once a week. Caution:Chemotherapy. Protect from light. 20 tablet 11   prednisoLONE acetate (PRED FORTE) 1 % ophthalmic suspension Place 1 drop into the left eye 3 (three) times daily.     predniSONE (DELTASONE) 5 MG tablet Take 2 tablets (10 mg total) by mouth daily with breakfast. 60 tablet 5   Amino Acids-Protein Hydrolys (FEEDING SUPPLEMENT, PRO-STAT SUGAR FREE 64,) LIQD Take 30 mLs by mouth 3 (three) times daily with meals. 15-100 gram-kcal/30 mL Special Instructions: 09-22-21: albumin 2.6; may mix with water or juice (Patient not taking: Reported on 04/07/2022)     dextromethorphan-guaiFENesin (MUCINEX DM) 30-600 MG 12hr tablet Take 1 tablet by mouth 2 (two) times daily as needed for up to 20 doses for cough. (Patient not taking: Reported on 04/07/2022) 20 tablet 0   furosemide (LASIX) 20 MG tablet Take 1 tablet (20 mg total) by mouth daily. (Patient not taking: Reported on 04/07/2022) 30 tablet 0   magnesium oxide (MAG-OX) 400 (240 Mg) MG tablet TAKE 1 TABLET TWICE DAILY (Patient not taking: Reported on 04/07/2022) 180 tablet 3   nystatin (MYCOSTATIN/NYSTOP) powder Apply  topically 2 (two) times daily. (Patient not taking: Reported on 04/07/2022)       Discharge Medications: Please see discharge summary for a list of discharge medications.  Relevant Imaging Results:  Relevant Lab Results:   Additional Information SS# [id number]  Joaquin Courts, RN

## 2022-04-07 NOTE — ED Notes (Signed)
Took bedpan out from under pt; pt had passed stool; performed peri care and partial linen change; put new brief on pt; pt in comfortable position

## 2022-04-07 NOTE — Evaluation (Signed)
Physical Therapy Evaluation Patient Details Name: [name] [name] MRN: [id number] DOB: 1932/09/04 Today's Date: 04/07/2022  History of Present Illness  MELLONY DANZIGER is a 89 y.o. female.     Pt is a 89 yo female with a pmhx significant for htn, hld, uterine cancer, arthritis, spinal stenosis, peripheral edema, and  dvt in left leg on Eliquis.  Pt wa scheduled for cataract surgery today.  The pt mentioned she's been having increased pain and swelling in the right leg.  Surgery was canceled and pt sent to the ED for dvt eval.  Pt denies any fevers.  She does get her legs wrapped twice a week due to hx peripheral edema and is due for them to be wrapped tomorrow.  The only Eliquis dose missed was this am because of her planned cataract surgery.   Clinical Impression  Patient demonstrates slow labored movement for sitting up at bedside, had difficulty maintaining sitting balance with frequent leaning/falling forward when attempting to scoot laterally seated at EOB and unable to transfer to chair due to high risk for falls.  Patient put back to bed with Max assist to reposition.  Patient will benefit from continued skilled physical therapy in hospital and recommended venue below to increase strength, balance, endurance for safe ADLs and gait.         Recommendations for follow up therapy are one component of a multi-disciplinary discharge planning process, led by the attending physician.  Recommendations may be updated based on patient status, additional functional criteria and insurance authorization.  Follow Up Recommendations Skilled nursing-short term rehab (<3 hours/day) Can patient physically be transported by private vehicle: No    Assistance Recommended at Discharge Intermittent Supervision/Assistance  Patient can return home with the following  A lot of help with walking and/or transfers;A lot of help with bathing/dressing/bathroom;Assistance with cooking/housework;Help with stairs or ramp for  entrance    Equipment Recommendations None recommended by PT  Recommendations for Other Services       Functional Status Assessment Patient has had a recent decline in their functional status and demonstrates the ability to make significant improvements in function in a reasonable and predictable amount of time.     Precautions / Restrictions Precautions Precautions: Fall Restrictions Weight Bearing Restrictions: No      Mobility  Bed Mobility Overal bed mobility: Needs Assistance Bed Mobility: Supine to Sit, Sit to Supine     Supine to sit: Mod assist Sit to supine: Min assist, Mod assist   General bed mobility comments: unsteady labored movement with frequent loss of balance falling forward when attempting lateral scooting at bedside    Transfers                        Ambulation/Gait                  Stairs            Wheelchair Mobility    Modified Rankin (Stroke Patients Only)       Balance Overall balance assessment: Needs assistance Sitting-balance support: Feet supported, No upper extremity supported Sitting balance-Leahy Scale: Poor Sitting balance - Comments: fair/poor seated at EOB Postural control: Other (comment) (frequent leaning forward)                                   Pertinent Vitals/Pain Pain Assessment Pain Assessment: Faces Faces Pain Scale: Hurts  little more Pain Location: BLE with pressure/movement Pain Descriptors / Indicators: Sore, Grimacing, Discomfort Pain Intervention(s): Limited activity within patient's tolerance, Monitored during session, Repositioned    Home Living Family/patient expects to be discharged to:: Private residence Living Arrangements: Alone Available Help at Discharge: Family;Available PRN/intermittently;Personal care attendant Type of Home: House Home Access: Stairs to enter Entrance Stairs-Rails: Psychiatric nurse of Steps: 5   Home Layout: One  level Home Equipment: Conservation officer, nature (2 wheels);Grab bars - tub/shower;Shower seat;BSC/3in1;Wheelchair - manual      Prior Function Prior Level of Function : Needs assist       Physical Assist : ADLs (physical)     Mobility Comments: Modified independent for transfers scooting over from wheelchair to bed ADLs Comments: Patient needs assist with ADL's with help from home aides who come for a couple of hours a day M-F. Performs sliding transfers between surfaces     Hand Dominance   Dominant Hand: Right    Extremity/Trunk Assessment   Upper Extremity Assessment Upper Extremity Assessment: Generalized weakness    Lower Extremity Assessment Lower Extremity Assessment: Generalized weakness    Cervical / Trunk Assessment Cervical / Trunk Assessment: Normal  Communication   Communication: No difficulties  Cognition Arousal/Alertness: Awake/alert Behavior During Therapy: WFL for tasks assessed/performed Overall Cognitive Status: Within Functional Limits for tasks assessed                                          General Comments      Exercises     Assessment/Plan    PT Assessment Patient needs continued PT services  PT Problem List Decreased strength;Decreased activity tolerance;Decreased balance;Decreased mobility       PT Treatment Interventions DME instruction;Functional mobility training;Therapeutic activities;Therapeutic exercise;Balance training;Patient/family education    PT Goals (Current goals can be found in the Care Plan section)  Acute Rehab PT Goals Patient Stated Goal: return home after rehab PT Goal Formulation: With patient/family Time For Goal Achievement: 04/21/22 Potential to Achieve Goals: Good    Frequency Min 2X/week     Co-evaluation               AM-PAC PT "6 Clicks" Mobility  Outcome Measure Help needed turning from your back to your side while in a flat bed without using bedrails?: A Little Help needed  moving from lying on your back to sitting on the side of a flat bed without using bedrails?: A Lot Help needed moving to and from a bed to a chair (including a wheelchair)?: A Lot Help needed standing up from a chair using your arms (e.g., wheelchair or bedside chair)?: Total Help needed to walk in hospital room?: Total Help needed climbing 3-5 steps with a railing? : Total 6 Click Score: 10    End of Session   Activity Tolerance: Patient tolerated treatment well;Patient limited by fatigue Patient left: in bed Nurse Communication: Mobility status PT Visit Diagnosis: Unsteadiness on feet (R26.81);Other abnormalities of gait and mobility (R26.89);Muscle weakness (generalized) (M62.81)    Time: 1005-1030 PT Time Calculation (min) (ACUTE ONLY): 25 min   Charges:   PT Evaluation $PT Eval Moderate Complexity: 1 Mod PT Treatments $Therapeutic Activity: 23-37 mins        12:26 PM, 04/07/22 Lonell Grandchild, MPT Physical Therapist with [facility] [phone] office [phone] mobile phone

## 2022-04-07 NOTE — Progress Notes (Signed)
Family selects [facility], attempted to notify facility rep, VM left.  Auth initiated.

## 2022-04-07 NOTE — Progress Notes (Signed)
Transition of Care [facility]) - Emergency Department Mini Assessment   Patient Details  Name: [name] [name] MRN: [id number] Date of Birth: September 22, 1932  Transition of Care (TOC) CM/SW Contact:    Joaquin Courts, RN Phone Number: 04/07/2022, 10:43 AM   Clinical Narrative:   CM received notification from therapy that patient is recommended for SNF rehab.  Outreached to son Loanne Emery who reports he was also made aware of this recommendation and the family is in agreement with this.  Family has no specific facility preference but patient has previously been to [facility] and had a good experience, would ideally like to go there again.  TOC did explain that cannot guarantee bed availability but will fax out in [place] to secure options.  Son reports he would defer final decision on facility choice to patient.  TOC will fax out FL2 and present bed offers once available.   ED Mini Assessment: What brought you to the Emergency Department? : right leg pain  Barriers to Discharge: No SNF bed  Barrier interventions: FL2 faxed out  Means of departure: Ambulance  Interventions which prevented an admission or readmission: SNF Placement    Patient Contact and Communications Key Contact 1: Anastasia Pall   Spoke with: Dellis Filbert Mcilwain Contact Date: 04/07/22,   Contact time: 1042   Call outcome: would like pt to go to snf  Patient states their goals for this hospitalization and ongoing recovery are:: to go to rehab CMS Medicare.gov Compare Post Acute Care list provided to:: Patient Choice offered to / list presented to : Patient  Admission diagnosis:  Leg Pain Patient Active Problem List   Diagnosis Date Noted   Diastolic dysfunction 62/56/3893   Aortic stenosis 01/16/2022   Mitral valve regurgitation 01/16/2022   Encounter for Medicare annual examination with abnormal findings 12/07/2021   Breast lump on right side at 11 o'clock position 11/06/2021   Severe protein-calorie  malnutrition ([place]) 09/25/2021   Aortic atherosclerosis ([place]) 09/25/2021   Rhabdomyolysis 09/20/2021   Recurrent falls 09/20/2021   Leukocytosis 09/20/2021   Chronic deep vein thrombosis (DVT) of distal vein of left lower extremity ([place]) 09/20/2021   Elevated troponin 09/20/2021   Osteoarthritis 09/20/2021   Paresthesia 05/05/2021   Gait abnormality 05/05/2021   Dermatitis 05/01/2021   Leg ulcer, left ([place]) 03/25/2021   HZV (herpes zoster virus) post herpetic neuralgia 05/31/2020   Zoster 04/30/2020   Hypomagnesemia 08/11/2019   Lower extremity edema 08/06/2019   Bilateral lower extremity edema 08/04/2019   Hypokalemia 08/04/2019   Hypoalbuminemia 08/04/2019   Venous stasis ulcers (HCC) 04/03/2019   Lower leg edema 03/22/2019   At high risk for falls per [name] fall risk assessment scale 02/09/2019   Neck pain, bilateral posterior 01/13/2019   Statins contraindicated 08/14/2018   Peripheral neuropathy 07/06/2018   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   Myopathy 02/15/2018   Lumbar spinal stenosis 01/14/2018   GERD (gastroesophageal reflux disease) 06/17/2016   Normocytic anemia    Sciatica of left side associated with disorder of lumbosacral spine 11/29/2014   Chronic constipation 10/04/2012   Urinary incontinence 06/20/2012   Vitamin D deficiency 08/22/2011   Prediabetes 05/29/2011   NASH (nonalcoholic steatohepatitis) 11/05/2009   Arthritis of right knee 09/04/2009   Hyperlipidemia LDL goal <100 09/08/2007   Essential hypertension 09/08/2007   PCP:  Fayrene Helper, MD Pharmacy:   [place], [place] - [place] [address] [place] [address] Phone: [phone] Fax: [phone]  [place], [place] [address] Phone: [phone] Fax: [phone]

## 2022-04-07 NOTE — Progress Notes (Signed)
CM met with patient at bedside and presented bed offers.  Patient to review offers and discuss with family to make a determination.  TOC awaiting facility choice from patient/ family.

## 2022-04-07 NOTE — ED Notes (Signed)
Updated family on status. Pt awaiting a PT evaluation and recommendation.

## 2022-04-07 NOTE — ED Notes (Signed)
Provided pts dinner tray

## 2022-04-08 ENCOUNTER — Ambulatory Visit: Payer: Medicare HMO | Admitting: Family Medicine

## 2022-04-08 DIAGNOSIS — L89616 Pressure-induced deep tissue damage of right heel: Secondary | ICD-10-CM | POA: Diagnosis not present

## 2022-04-08 DIAGNOSIS — M25551 Pain in right hip: Secondary | ICD-10-CM | POA: Diagnosis not present

## 2022-04-08 DIAGNOSIS — Z7901 Long term (current) use of anticoagulants: Secondary | ICD-10-CM | POA: Diagnosis not present

## 2022-04-08 DIAGNOSIS — L97919 Non-pressure chronic ulcer of unspecified part of right lower leg with unspecified severity: Secondary | ICD-10-CM | POA: Diagnosis not present

## 2022-04-08 DIAGNOSIS — N6311 Unspecified lump in the right breast, upper outer quadrant: Secondary | ICD-10-CM | POA: Diagnosis not present

## 2022-04-08 DIAGNOSIS — G629 Polyneuropathy, unspecified: Secondary | ICD-10-CM | POA: Diagnosis not present

## 2022-04-08 DIAGNOSIS — L97811 Non-pressure chronic ulcer of other part of right lower leg limited to breakdown of skin: Secondary | ICD-10-CM | POA: Diagnosis not present

## 2022-04-08 DIAGNOSIS — M069 Rheumatoid arthritis, unspecified: Secondary | ICD-10-CM | POA: Diagnosis not present

## 2022-04-08 DIAGNOSIS — E559 Vitamin D deficiency, unspecified: Secondary | ICD-10-CM | POA: Diagnosis not present

## 2022-04-08 DIAGNOSIS — R790 Abnormal level of blood mineral: Secondary | ICD-10-CM | POA: Diagnosis not present

## 2022-04-08 DIAGNOSIS — R600 Localized edema: Secondary | ICD-10-CM | POA: Diagnosis not present

## 2022-04-08 DIAGNOSIS — I739 Peripheral vascular disease, unspecified: Secondary | ICD-10-CM | POA: Diagnosis not present

## 2022-04-08 DIAGNOSIS — S81801A Unspecified open wound, right lower leg, initial encounter: Secondary | ICD-10-CM | POA: Diagnosis not present

## 2022-04-08 DIAGNOSIS — M6281 Muscle weakness (generalized): Secondary | ICD-10-CM | POA: Diagnosis not present

## 2022-04-08 DIAGNOSIS — R239 Unspecified skin changes: Secondary | ICD-10-CM | POA: Diagnosis not present

## 2022-04-08 DIAGNOSIS — L97821 Non-pressure chronic ulcer of other part of left lower leg limited to breakdown of skin: Secondary | ICD-10-CM | POA: Diagnosis not present

## 2022-04-08 DIAGNOSIS — L0390 Cellulitis, unspecified: Secondary | ICD-10-CM | POA: Diagnosis not present

## 2022-04-08 DIAGNOSIS — E782 Mixed hyperlipidemia: Secondary | ICD-10-CM | POA: Diagnosis not present

## 2022-04-08 DIAGNOSIS — L97921 Non-pressure chronic ulcer of unspecified part of left lower leg limited to breakdown of skin: Secondary | ICD-10-CM | POA: Diagnosis not present

## 2022-04-08 DIAGNOSIS — R609 Edema, unspecified: Secondary | ICD-10-CM | POA: Diagnosis not present

## 2022-04-08 DIAGNOSIS — M1990 Unspecified osteoarthritis, unspecified site: Secondary | ICD-10-CM | POA: Diagnosis not present

## 2022-04-08 DIAGNOSIS — R269 Unspecified abnormalities of gait and mobility: Secondary | ICD-10-CM | POA: Diagnosis not present

## 2022-04-08 DIAGNOSIS — I82409 Acute embolism and thrombosis of unspecified deep veins of unspecified lower extremity: Secondary | ICD-10-CM | POA: Diagnosis not present

## 2022-04-08 DIAGNOSIS — I10 Essential (primary) hypertension: Secondary | ICD-10-CM | POA: Diagnosis not present

## 2022-04-08 DIAGNOSIS — S81802A Unspecified open wound, left lower leg, initial encounter: Secondary | ICD-10-CM | POA: Diagnosis not present

## 2022-04-08 DIAGNOSIS — I340 Nonrheumatic mitral (valve) insufficiency: Secondary | ICD-10-CM | POA: Diagnosis not present

## 2022-04-08 DIAGNOSIS — R531 Weakness: Secondary | ICD-10-CM | POA: Diagnosis not present

## 2022-04-08 DIAGNOSIS — R279 Unspecified lack of coordination: Secondary | ICD-10-CM | POA: Diagnosis not present

## 2022-04-08 DIAGNOSIS — R293 Abnormal posture: Secondary | ICD-10-CM | POA: Diagnosis not present

## 2022-04-08 DIAGNOSIS — Z743 Need for continuous supervision: Secondary | ICD-10-CM | POA: Diagnosis not present

## 2022-04-08 DIAGNOSIS — I350 Nonrheumatic aortic (valve) stenosis: Secondary | ICD-10-CM | POA: Diagnosis not present

## 2022-04-08 NOTE — ED Provider Notes (Signed)
Emergency Medicine Observation Re-evaluation Note  [name] [name] is a 89 y.o. female, seen on rounds today.  Pt initially presented to the ED for complaints of Hip Pain Currently, the patient is resting in bed, working with the NT to get comfortable. Aware of placement  Physical Exam  BP 138/71   Pulse 72   Temp 98.2 F (36.8 C) (Oral)   Resp 17   Ht '5\' 6"'$  (1.676 m)   Wt 73.9 kg   SpO2 97%   BMI 26.31 kg/m  Physical Exam General: No acute distress Cardiac: Regular rate Lungs: equal chest rise Psych: calm and cooperative  ED Course / MDM  EKG:   I have reviewed the labs performed to date as well as medications administered while in observation.  Recent changes in the last 24 hours include accepted to [facility].  Plan  Current plan is for discharge to SNF ([facility]). Patient agreeable with plan of discharge.    Cristie Hem, MD 04/08/22 [phone]

## 2022-04-08 NOTE — ED Notes (Signed)
EMS contacted for transport to [facility]

## 2022-04-08 NOTE — Progress Notes (Addendum)
Pt's Auth approved. TOC to coordinate d/c with [facility]. This CSW verified bed with Loree F[facility] - [facility] to inform of room assignment at [facility].  Pt's son, Dellis Filbert, has been notified.   Addend @ 8:52 PM This CSW notified EDP and RN via secure chat. RN to call EMS for transport. TOC signing off.

## 2022-04-09 DIAGNOSIS — E559 Vitamin D deficiency, unspecified: Secondary | ICD-10-CM | POA: Diagnosis not present

## 2022-04-09 DIAGNOSIS — M069 Rheumatoid arthritis, unspecified: Secondary | ICD-10-CM | POA: Diagnosis not present

## 2022-04-09 DIAGNOSIS — I739 Peripheral vascular disease, unspecified: Secondary | ICD-10-CM | POA: Diagnosis not present

## 2022-04-09 DIAGNOSIS — R600 Localized edema: Secondary | ICD-10-CM | POA: Diagnosis not present

## 2022-04-09 DIAGNOSIS — M6281 Muscle weakness (generalized): Secondary | ICD-10-CM | POA: Diagnosis not present

## 2022-04-09 DIAGNOSIS — M25551 Pain in right hip: Secondary | ICD-10-CM | POA: Diagnosis not present

## 2022-04-09 DIAGNOSIS — E782 Mixed hyperlipidemia: Secondary | ICD-10-CM | POA: Diagnosis not present

## 2022-04-10 ENCOUNTER — Ambulatory Visit (HOSPITAL_BASED_OUTPATIENT_CLINIC_OR_DEPARTMENT_OTHER): Payer: Medicare HMO | Admitting: General Surgery

## 2022-04-11 DIAGNOSIS — I739 Peripheral vascular disease, unspecified: Secondary | ICD-10-CM | POA: Diagnosis not present

## 2022-04-11 DIAGNOSIS — R600 Localized edema: Secondary | ICD-10-CM | POA: Diagnosis not present

## 2022-04-11 DIAGNOSIS — E559 Vitamin D deficiency, unspecified: Secondary | ICD-10-CM | POA: Diagnosis not present

## 2022-04-11 DIAGNOSIS — M6281 Muscle weakness (generalized): Secondary | ICD-10-CM | POA: Diagnosis not present

## 2022-04-11 DIAGNOSIS — M069 Rheumatoid arthritis, unspecified: Secondary | ICD-10-CM | POA: Diagnosis not present

## 2022-04-11 DIAGNOSIS — E782 Mixed hyperlipidemia: Secondary | ICD-10-CM | POA: Diagnosis not present

## 2022-04-11 DIAGNOSIS — M25551 Pain in right hip: Secondary | ICD-10-CM | POA: Diagnosis not present

## 2022-04-14 DIAGNOSIS — I739 Peripheral vascular disease, unspecified: Secondary | ICD-10-CM | POA: Diagnosis not present

## 2022-04-14 DIAGNOSIS — S81802A Unspecified open wound, left lower leg, initial encounter: Secondary | ICD-10-CM | POA: Diagnosis not present

## 2022-04-14 DIAGNOSIS — S81801A Unspecified open wound, right lower leg, initial encounter: Secondary | ICD-10-CM | POA: Diagnosis not present

## 2022-04-16 DIAGNOSIS — I739 Peripheral vascular disease, unspecified: Secondary | ICD-10-CM | POA: Diagnosis not present

## 2022-04-16 DIAGNOSIS — M6281 Muscle weakness (generalized): Secondary | ICD-10-CM | POA: Diagnosis not present

## 2022-04-16 DIAGNOSIS — L0390 Cellulitis, unspecified: Secondary | ICD-10-CM | POA: Diagnosis not present

## 2022-04-16 DIAGNOSIS — M25551 Pain in right hip: Secondary | ICD-10-CM | POA: Diagnosis not present

## 2022-04-16 DIAGNOSIS — R609 Edema, unspecified: Secondary | ICD-10-CM | POA: Diagnosis not present

## 2022-04-16 DIAGNOSIS — I82409 Acute embolism and thrombosis of unspecified deep veins of unspecified lower extremity: Secondary | ICD-10-CM | POA: Diagnosis not present

## 2022-04-16 DIAGNOSIS — E782 Mixed hyperlipidemia: Secondary | ICD-10-CM | POA: Diagnosis not present

## 2022-04-16 DIAGNOSIS — M069 Rheumatoid arthritis, unspecified: Secondary | ICD-10-CM | POA: Diagnosis not present

## 2022-04-17 DIAGNOSIS — L97919 Non-pressure chronic ulcer of unspecified part of right lower leg with unspecified severity: Secondary | ICD-10-CM | POA: Diagnosis not present

## 2022-04-17 DIAGNOSIS — I739 Peripheral vascular disease, unspecified: Secondary | ICD-10-CM | POA: Diagnosis not present

## 2022-04-17 DIAGNOSIS — R600 Localized edema: Secondary | ICD-10-CM | POA: Diagnosis not present

## 2022-04-17 DIAGNOSIS — S81801A Unspecified open wound, right lower leg, initial encounter: Secondary | ICD-10-CM | POA: Diagnosis not present

## 2022-04-17 DIAGNOSIS — L0390 Cellulitis, unspecified: Secondary | ICD-10-CM | POA: Diagnosis not present

## 2022-04-17 DIAGNOSIS — R239 Unspecified skin changes: Secondary | ICD-10-CM | POA: Diagnosis not present

## 2022-04-17 DIAGNOSIS — L89616 Pressure-induced deep tissue damage of right heel: Secondary | ICD-10-CM | POA: Diagnosis not present

## 2022-04-17 DIAGNOSIS — L97921 Non-pressure chronic ulcer of unspecified part of left lower leg limited to breakdown of skin: Secondary | ICD-10-CM | POA: Diagnosis not present

## 2022-04-17 DIAGNOSIS — S81802A Unspecified open wound, left lower leg, initial encounter: Secondary | ICD-10-CM | POA: Diagnosis not present

## 2022-04-17 DIAGNOSIS — G629 Polyneuropathy, unspecified: Secondary | ICD-10-CM | POA: Diagnosis not present

## 2022-04-17 DIAGNOSIS — M6281 Muscle weakness (generalized): Secondary | ICD-10-CM | POA: Diagnosis not present

## 2022-04-19 IMAGING — DX DG KNEE COMPLETE 4+V*L*
4 series · 4 of 4 positions shown · non-contrast
Comparison: None.

CLINICAL DATA: Left knee pain and swelling, onset this morning.

EXAM:
LEFT KNEE - COMPLETE 4+ VIEW

[knee ap (1 of 2)]
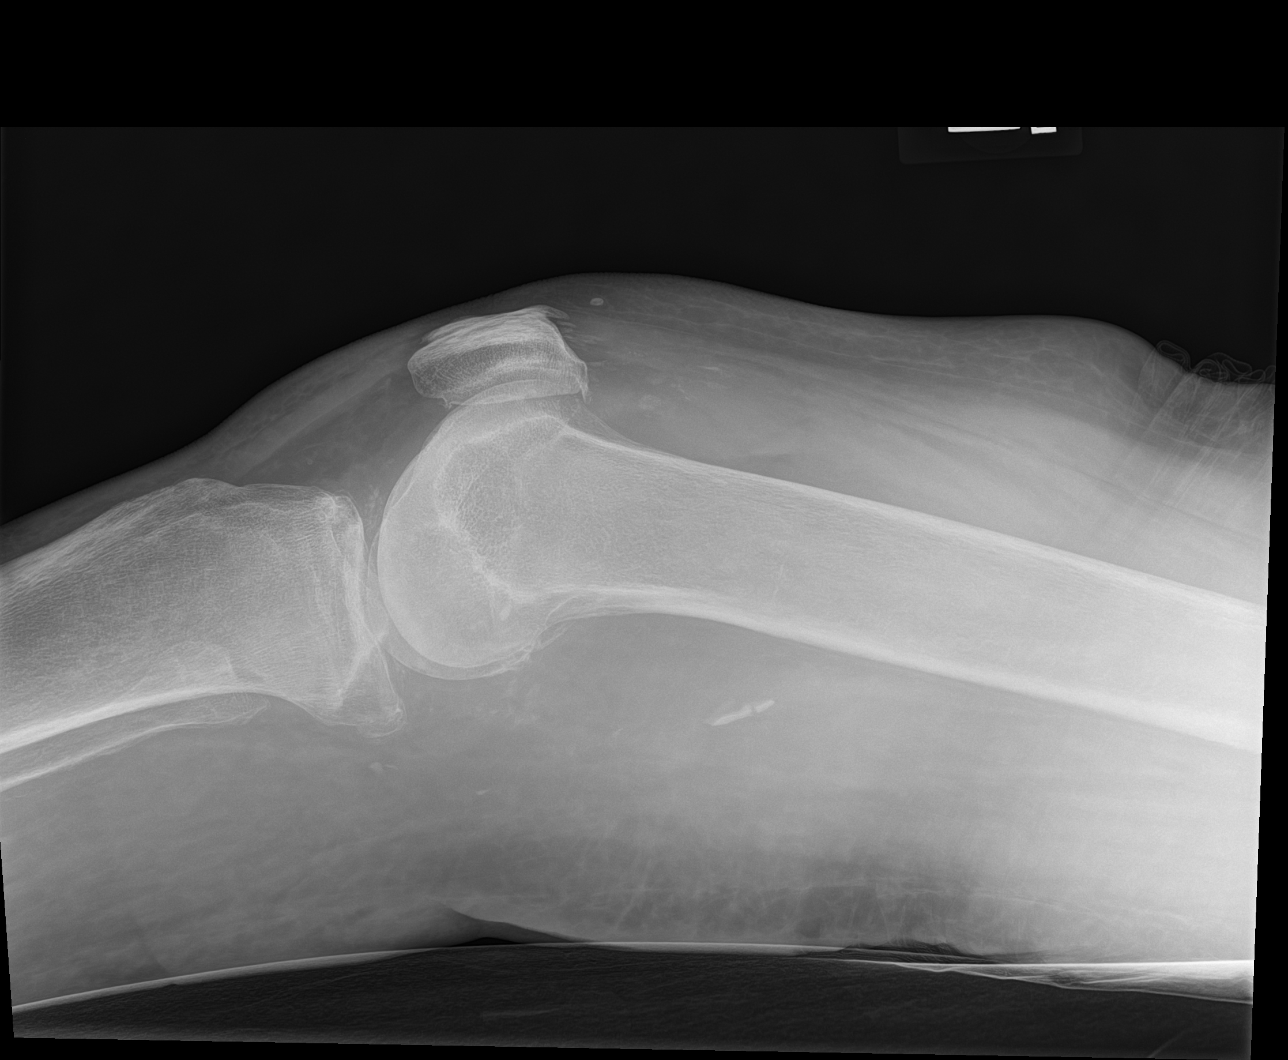

[knee obl (1 of 2)]
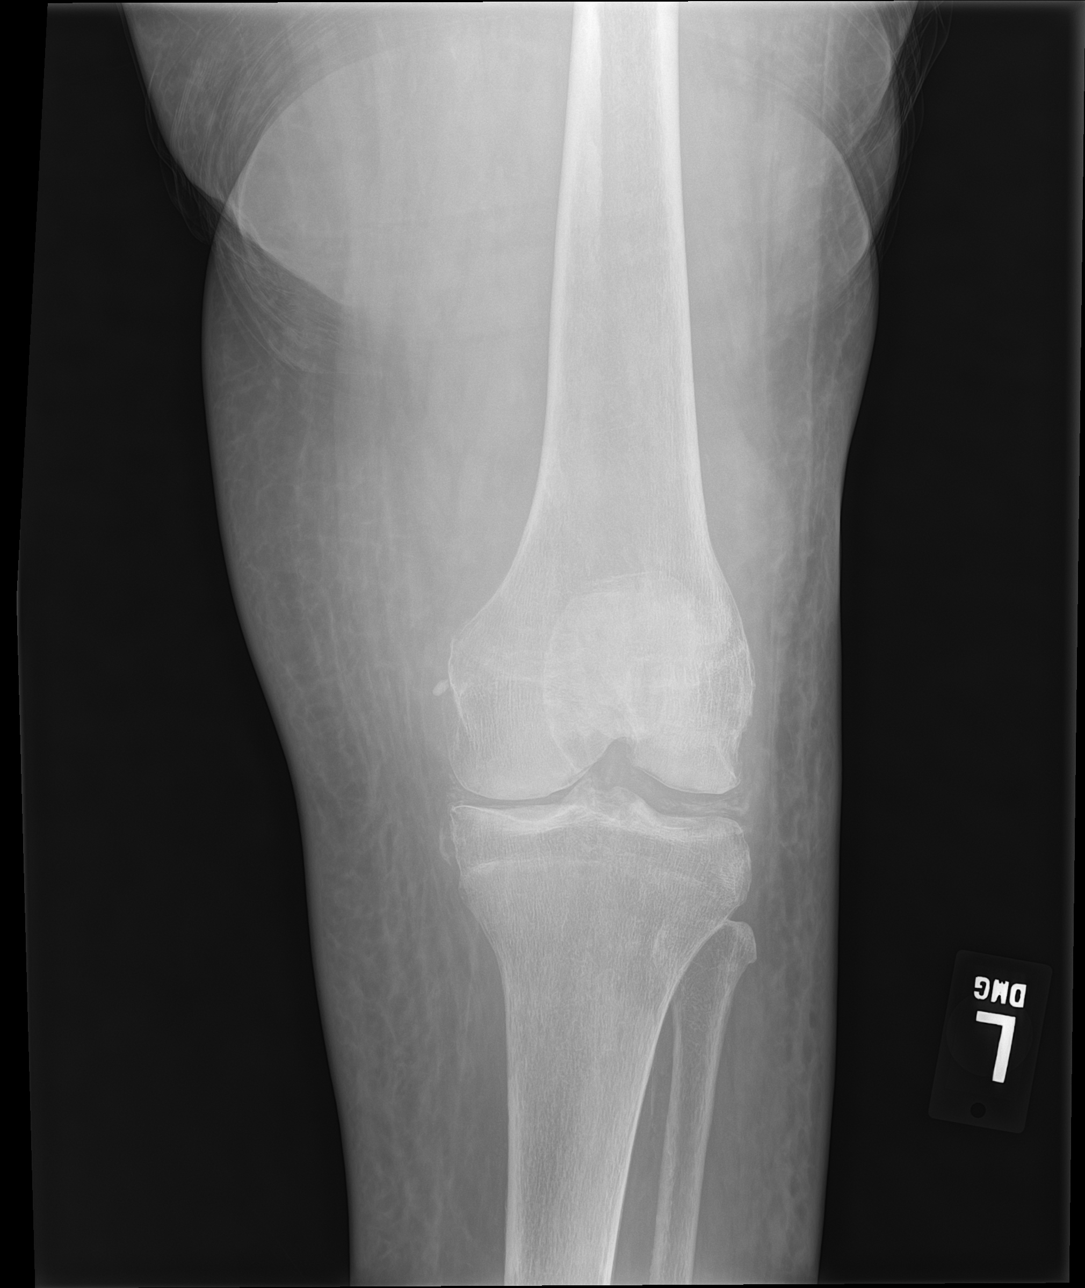

[knee ap (2 of 2)]
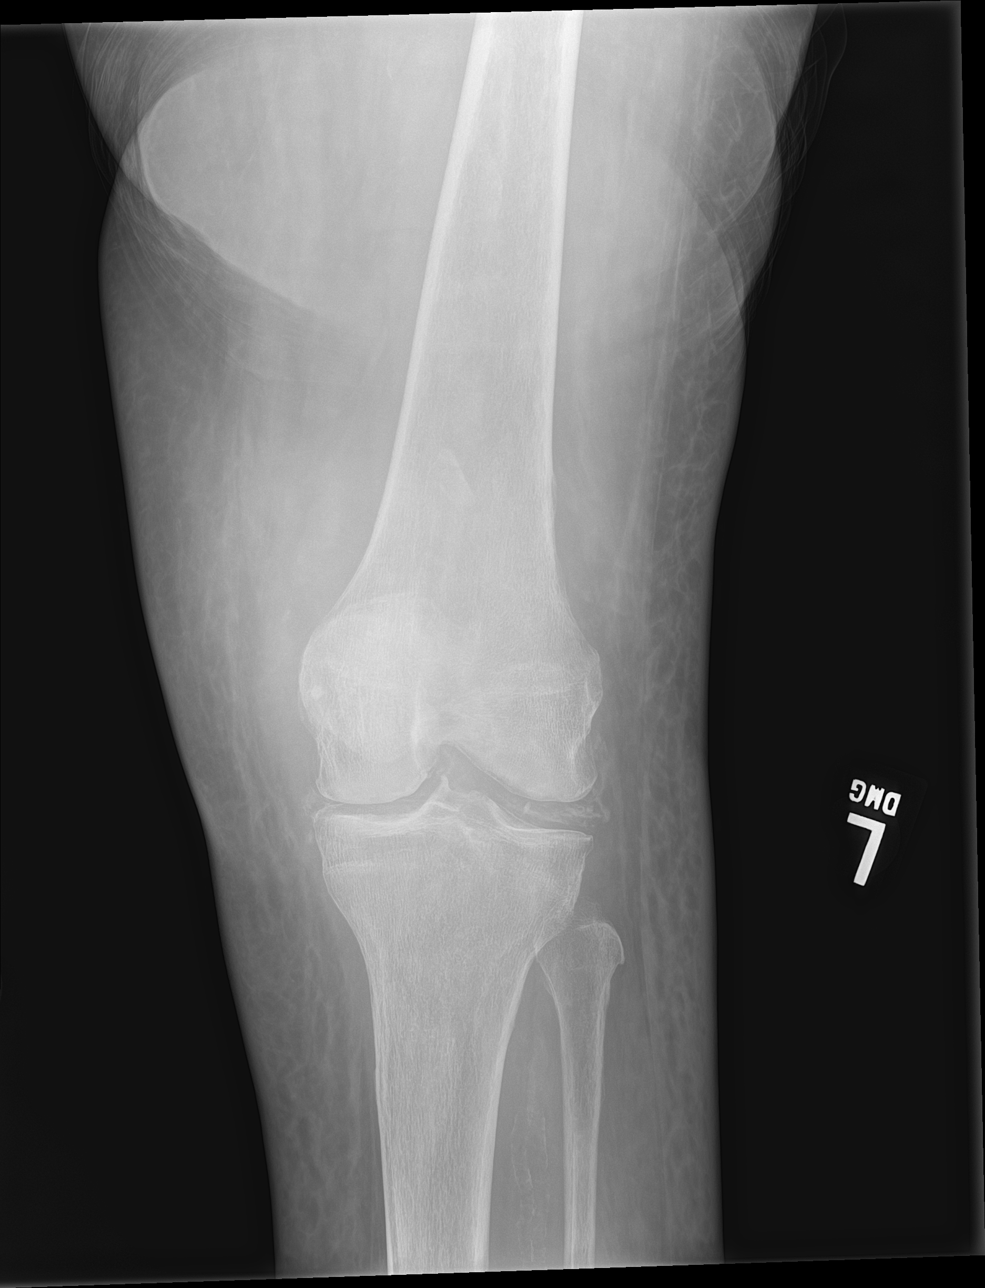

[knee obl (2 of 2)]
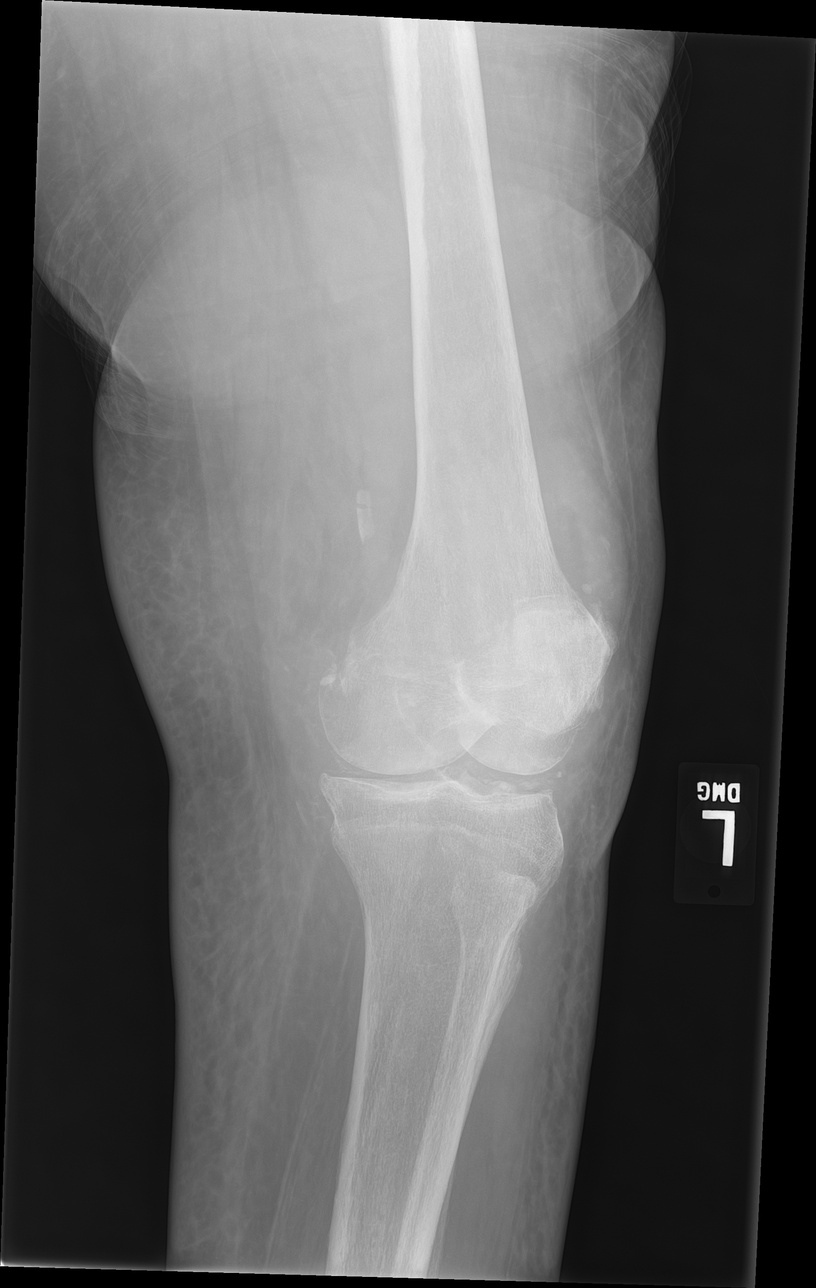

[4 of 4 positions shown; findings below may reference images not displayed]

FINDINGS: No fracture or dislocation. Tricompartmental osteoarthritis with
peripheral spurring and medial tibiofemoral joint space narrowing.
There is chondrocalcinosis medially and laterally. No erosion or
bony destruction. No periosteal reaction. Moderate joint effusion.
There is generalized soft tissue edema.
IMPRESSION: 1. Tricompartmental osteoarthritis with chondrocalcinosis. Moderate
joint effusion. No acute bony abnormality.
2. Generalized soft tissue edema.

## 2022-04-20 DIAGNOSIS — S81801A Unspecified open wound, right lower leg, initial encounter: Secondary | ICD-10-CM | POA: Diagnosis not present

## 2022-04-20 DIAGNOSIS — S81802A Unspecified open wound, left lower leg, initial encounter: Secondary | ICD-10-CM | POA: Diagnosis not present

## 2022-04-20 DIAGNOSIS — L0390 Cellulitis, unspecified: Secondary | ICD-10-CM | POA: Diagnosis not present

## 2022-04-22 DIAGNOSIS — M6281 Muscle weakness (generalized): Secondary | ICD-10-CM | POA: Diagnosis not present

## 2022-04-22 DIAGNOSIS — L97921 Non-pressure chronic ulcer of unspecified part of left lower leg limited to breakdown of skin: Secondary | ICD-10-CM | POA: Diagnosis not present

## 2022-04-22 DIAGNOSIS — R239 Unspecified skin changes: Secondary | ICD-10-CM | POA: Diagnosis not present

## 2022-04-22 DIAGNOSIS — L97919 Non-pressure chronic ulcer of unspecified part of right lower leg with unspecified severity: Secondary | ICD-10-CM | POA: Diagnosis not present

## 2022-04-22 DIAGNOSIS — L89616 Pressure-induced deep tissue damage of right heel: Secondary | ICD-10-CM | POA: Diagnosis not present

## 2022-04-22 DIAGNOSIS — G629 Polyneuropathy, unspecified: Secondary | ICD-10-CM | POA: Diagnosis not present

## 2022-04-24 DIAGNOSIS — L0390 Cellulitis, unspecified: Secondary | ICD-10-CM | POA: Diagnosis not present

## 2022-04-24 DIAGNOSIS — M25551 Pain in right hip: Secondary | ICD-10-CM | POA: Diagnosis not present

## 2022-04-24 DIAGNOSIS — E559 Vitamin D deficiency, unspecified: Secondary | ICD-10-CM | POA: Diagnosis not present

## 2022-04-24 DIAGNOSIS — I739 Peripheral vascular disease, unspecified: Secondary | ICD-10-CM | POA: Diagnosis not present

## 2022-04-24 DIAGNOSIS — S81802A Unspecified open wound, left lower leg, initial encounter: Secondary | ICD-10-CM | POA: Diagnosis not present

## 2022-04-24 DIAGNOSIS — E782 Mixed hyperlipidemia: Secondary | ICD-10-CM | POA: Diagnosis not present

## 2022-04-24 DIAGNOSIS — M6281 Muscle weakness (generalized): Secondary | ICD-10-CM | POA: Diagnosis not present

## 2022-04-24 DIAGNOSIS — S81801A Unspecified open wound, right lower leg, initial encounter: Secondary | ICD-10-CM | POA: Diagnosis not present

## 2022-04-29 DIAGNOSIS — S81801A Unspecified open wound, right lower leg, initial encounter: Secondary | ICD-10-CM | POA: Diagnosis not present

## 2022-04-29 DIAGNOSIS — E782 Mixed hyperlipidemia: Secondary | ICD-10-CM | POA: Diagnosis not present

## 2022-04-29 DIAGNOSIS — L89616 Pressure-induced deep tissue damage of right heel: Secondary | ICD-10-CM | POA: Diagnosis not present

## 2022-04-29 DIAGNOSIS — L0390 Cellulitis, unspecified: Secondary | ICD-10-CM | POA: Diagnosis not present

## 2022-04-29 DIAGNOSIS — I739 Peripheral vascular disease, unspecified: Secondary | ICD-10-CM | POA: Diagnosis not present

## 2022-04-29 DIAGNOSIS — L97811 Non-pressure chronic ulcer of other part of right lower leg limited to breakdown of skin: Secondary | ICD-10-CM | POA: Diagnosis not present

## 2022-04-29 DIAGNOSIS — M6281 Muscle weakness (generalized): Secondary | ICD-10-CM | POA: Diagnosis not present

## 2022-04-29 DIAGNOSIS — R239 Unspecified skin changes: Secondary | ICD-10-CM | POA: Diagnosis not present

## 2022-04-29 DIAGNOSIS — S81802A Unspecified open wound, left lower leg, initial encounter: Secondary | ICD-10-CM | POA: Diagnosis not present

## 2022-04-29 DIAGNOSIS — M25551 Pain in right hip: Secondary | ICD-10-CM | POA: Diagnosis not present

## 2022-04-29 DIAGNOSIS — L97821 Non-pressure chronic ulcer of other part of left lower leg limited to breakdown of skin: Secondary | ICD-10-CM | POA: Diagnosis not present

## 2022-04-29 DIAGNOSIS — G629 Polyneuropathy, unspecified: Secondary | ICD-10-CM | POA: Diagnosis not present

## 2022-04-29 DIAGNOSIS — E559 Vitamin D deficiency, unspecified: Secondary | ICD-10-CM | POA: Diagnosis not present

## 2022-04-30 ENCOUNTER — Encounter: Payer: Self-pay | Admitting: Neurology

## 2022-04-30 ENCOUNTER — Ambulatory Visit: Payer: Medicare HMO | Admitting: Neurology

## 2022-05-06 DIAGNOSIS — M25551 Pain in right hip: Secondary | ICD-10-CM | POA: Diagnosis not present

## 2022-05-06 DIAGNOSIS — S81802A Unspecified open wound, left lower leg, initial encounter: Secondary | ICD-10-CM | POA: Diagnosis not present

## 2022-05-06 DIAGNOSIS — E559 Vitamin D deficiency, unspecified: Secondary | ICD-10-CM | POA: Diagnosis not present

## 2022-05-06 DIAGNOSIS — E782 Mixed hyperlipidemia: Secondary | ICD-10-CM | POA: Diagnosis not present

## 2022-05-06 DIAGNOSIS — S81801A Unspecified open wound, right lower leg, initial encounter: Secondary | ICD-10-CM | POA: Diagnosis not present

## 2022-05-06 DIAGNOSIS — M6281 Muscle weakness (generalized): Secondary | ICD-10-CM | POA: Diagnosis not present

## 2022-05-06 DIAGNOSIS — L0390 Cellulitis, unspecified: Secondary | ICD-10-CM | POA: Diagnosis not present

## 2022-05-06 DIAGNOSIS — I739 Peripheral vascular disease, unspecified: Secondary | ICD-10-CM | POA: Diagnosis not present

## 2022-05-07 DIAGNOSIS — R239 Unspecified skin changes: Secondary | ICD-10-CM | POA: Diagnosis not present

## 2022-05-07 DIAGNOSIS — G629 Polyneuropathy, unspecified: Secondary | ICD-10-CM | POA: Diagnosis not present

## 2022-05-07 DIAGNOSIS — L97821 Non-pressure chronic ulcer of other part of left lower leg limited to breakdown of skin: Secondary | ICD-10-CM | POA: Diagnosis not present

## 2022-05-07 DIAGNOSIS — M6281 Muscle weakness (generalized): Secondary | ICD-10-CM | POA: Diagnosis not present

## 2022-05-07 DIAGNOSIS — L97811 Non-pressure chronic ulcer of other part of right lower leg limited to breakdown of skin: Secondary | ICD-10-CM | POA: Diagnosis not present

## 2022-05-07 DIAGNOSIS — L89616 Pressure-induced deep tissue damage of right heel: Secondary | ICD-10-CM | POA: Diagnosis not present

## 2022-05-10 IMAGING — DX DG CHEST 1V PORT
1 series · 1 of 1 positions shown · non-contrast
Comparison: None.

CLINICAL DATA: Edema

EXAM:
PORTABLE CHEST 1 VIEW

[chest ap]
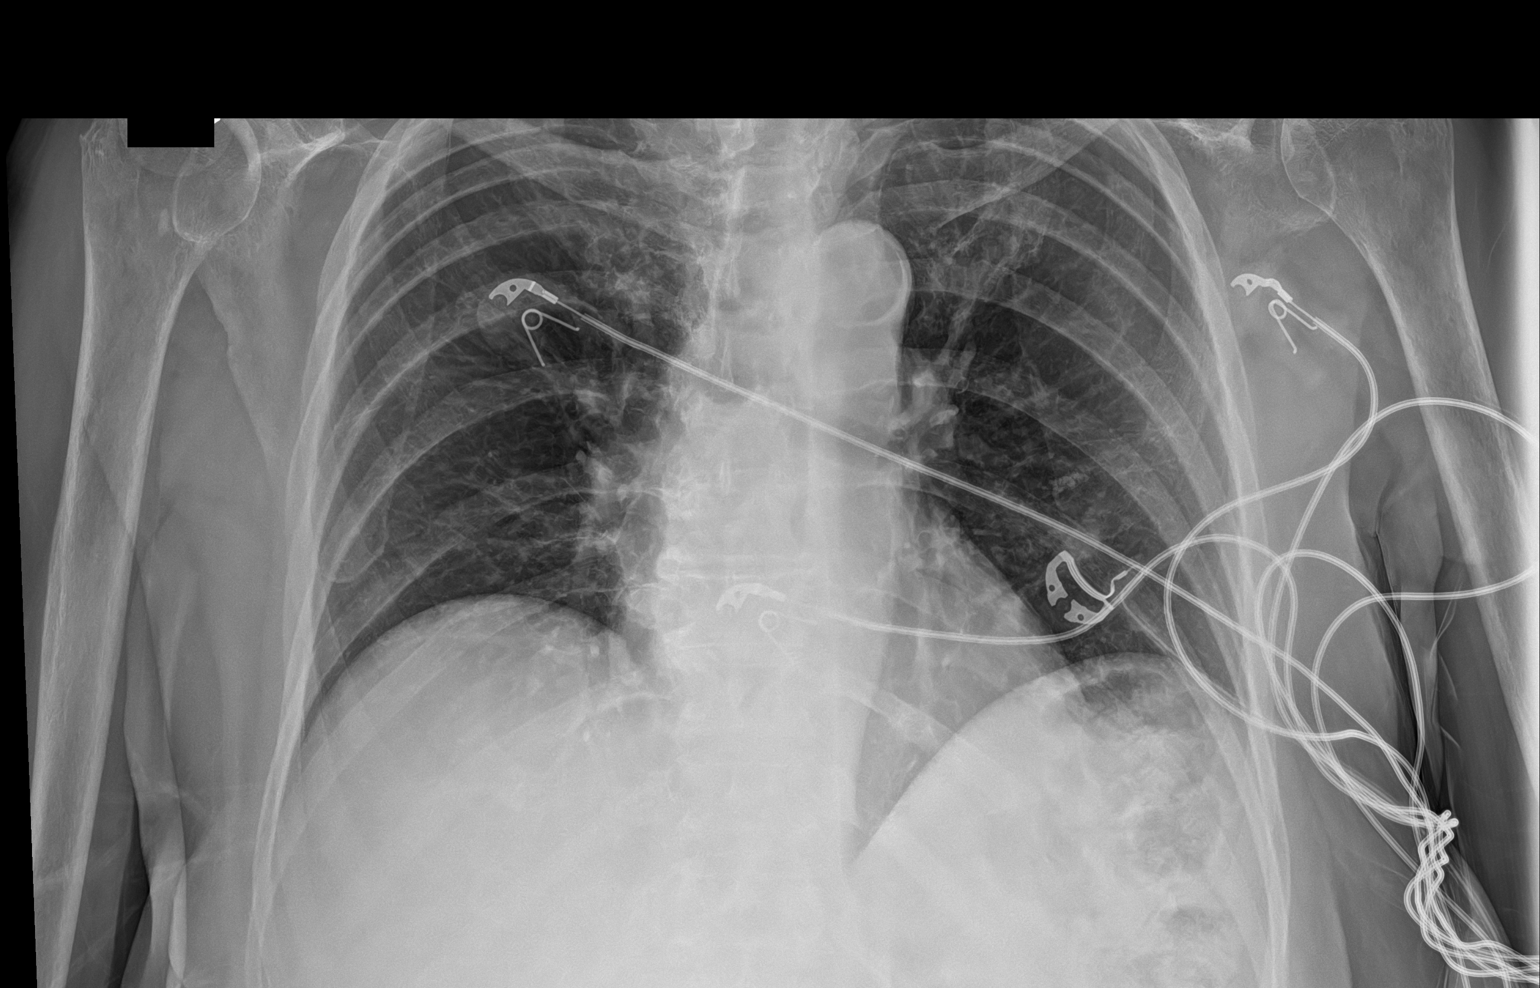

[1 of 1 positions shown; findings below may reference images not displayed]

FINDINGS: The heart size and mediastinal contours are within normal limits.
Aortic knob calcifications are present. The lungs are clear. No
large airspace consolidation or pleural effusion. The visualized
skeletal structures are unremarkable.
IMPRESSION: No active disease.

## 2022-05-11 DIAGNOSIS — M6281 Muscle weakness (generalized): Secondary | ICD-10-CM | POA: Diagnosis not present

## 2022-05-11 DIAGNOSIS — L0390 Cellulitis, unspecified: Secondary | ICD-10-CM | POA: Diagnosis not present

## 2022-05-11 DIAGNOSIS — S81802A Unspecified open wound, left lower leg, initial encounter: Secondary | ICD-10-CM | POA: Diagnosis not present

## 2022-05-11 DIAGNOSIS — M25551 Pain in right hip: Secondary | ICD-10-CM | POA: Diagnosis not present

## 2022-05-11 DIAGNOSIS — E559 Vitamin D deficiency, unspecified: Secondary | ICD-10-CM | POA: Diagnosis not present

## 2022-05-11 DIAGNOSIS — I739 Peripheral vascular disease, unspecified: Secondary | ICD-10-CM | POA: Diagnosis not present

## 2022-05-11 DIAGNOSIS — S81801A Unspecified open wound, right lower leg, initial encounter: Secondary | ICD-10-CM | POA: Diagnosis not present

## 2022-05-11 DIAGNOSIS — E782 Mixed hyperlipidemia: Secondary | ICD-10-CM | POA: Diagnosis not present

## 2022-05-15 DIAGNOSIS — L89616 Pressure-induced deep tissue damage of right heel: Secondary | ICD-10-CM | POA: Diagnosis not present

## 2022-05-15 DIAGNOSIS — G629 Polyneuropathy, unspecified: Secondary | ICD-10-CM | POA: Diagnosis not present

## 2022-05-15 DIAGNOSIS — L97821 Non-pressure chronic ulcer of other part of left lower leg limited to breakdown of skin: Secondary | ICD-10-CM | POA: Diagnosis not present

## 2022-05-15 DIAGNOSIS — R239 Unspecified skin changes: Secondary | ICD-10-CM | POA: Diagnosis not present

## 2022-05-15 DIAGNOSIS — L97811 Non-pressure chronic ulcer of other part of right lower leg limited to breakdown of skin: Secondary | ICD-10-CM | POA: Diagnosis not present

## 2022-05-15 DIAGNOSIS — M6281 Muscle weakness (generalized): Secondary | ICD-10-CM | POA: Diagnosis not present

## 2022-05-20 DIAGNOSIS — G629 Polyneuropathy, unspecified: Secondary | ICD-10-CM | POA: Diagnosis not present

## 2022-05-20 DIAGNOSIS — L97821 Non-pressure chronic ulcer of other part of left lower leg limited to breakdown of skin: Secondary | ICD-10-CM | POA: Diagnosis not present

## 2022-05-20 DIAGNOSIS — R239 Unspecified skin changes: Secondary | ICD-10-CM | POA: Diagnosis not present

## 2022-05-20 DIAGNOSIS — M6281 Muscle weakness (generalized): Secondary | ICD-10-CM | POA: Diagnosis not present

## 2022-05-20 DIAGNOSIS — L97811 Non-pressure chronic ulcer of other part of right lower leg limited to breakdown of skin: Secondary | ICD-10-CM | POA: Diagnosis not present

## 2022-05-20 DIAGNOSIS — L89616 Pressure-induced deep tissue damage of right heel: Secondary | ICD-10-CM | POA: Diagnosis not present

## 2022-05-27 DIAGNOSIS — L97811 Non-pressure chronic ulcer of other part of right lower leg limited to breakdown of skin: Secondary | ICD-10-CM | POA: Diagnosis not present

## 2022-05-27 DIAGNOSIS — R239 Unspecified skin changes: Secondary | ICD-10-CM | POA: Diagnosis not present

## 2022-05-27 DIAGNOSIS — L97821 Non-pressure chronic ulcer of other part of left lower leg limited to breakdown of skin: Secondary | ICD-10-CM | POA: Diagnosis not present

## 2022-05-27 DIAGNOSIS — G629 Polyneuropathy, unspecified: Secondary | ICD-10-CM | POA: Diagnosis not present

## 2022-05-27 DIAGNOSIS — L89616 Pressure-induced deep tissue damage of right heel: Secondary | ICD-10-CM | POA: Diagnosis not present

## 2022-05-27 DIAGNOSIS — M6281 Muscle weakness (generalized): Secondary | ICD-10-CM | POA: Diagnosis not present

## 2022-05-31 DIAGNOSIS — G8929 Other chronic pain: Secondary | ICD-10-CM | POA: Diagnosis not present

## 2022-05-31 DIAGNOSIS — I82509 Chronic embolism and thrombosis of unspecified deep veins of unspecified lower extremity: Secondary | ICD-10-CM | POA: Diagnosis not present

## 2022-05-31 DIAGNOSIS — K219 Gastro-esophageal reflux disease without esophagitis: Secondary | ICD-10-CM | POA: Diagnosis not present

## 2022-05-31 DIAGNOSIS — I739 Peripheral vascular disease, unspecified: Secondary | ICD-10-CM | POA: Diagnosis not present

## 2022-05-31 DIAGNOSIS — I080 Rheumatic disorders of both mitral and aortic valves: Secondary | ICD-10-CM | POA: Diagnosis not present

## 2022-05-31 DIAGNOSIS — I890 Lymphedema, not elsewhere classified: Secondary | ICD-10-CM | POA: Diagnosis not present

## 2022-05-31 DIAGNOSIS — M1611 Unilateral primary osteoarthritis, right hip: Secondary | ICD-10-CM | POA: Diagnosis not present

## 2022-05-31 DIAGNOSIS — M48061 Spinal stenosis, lumbar region without neurogenic claudication: Secondary | ICD-10-CM | POA: Diagnosis not present

## 2022-05-31 DIAGNOSIS — G629 Polyneuropathy, unspecified: Secondary | ICD-10-CM | POA: Diagnosis not present

## 2022-06-01 ENCOUNTER — Telehealth: Payer: Self-pay | Admitting: Family Medicine

## 2022-06-01 NOTE — Telephone Encounter (Signed)
[name] [name] [facility], [phone]  Called stating she saw her post-hospital, had fall from blacking out. Having weakness & edema. Loss Consciousness& cellulitis in lower extremities. Will do medication management.    1 wk x3  2 month x 1 2 PRN

## 2022-06-02 NOTE — Telephone Encounter (Signed)
Verbal order given  

## 2022-06-08 ENCOUNTER — Telehealth: Payer: Self-pay | Admitting: Family Medicine

## 2022-06-08 DIAGNOSIS — I82509 Chronic embolism and thrombosis of unspecified deep veins of unspecified lower extremity: Secondary | ICD-10-CM | POA: Diagnosis not present

## 2022-06-08 DIAGNOSIS — I080 Rheumatic disorders of both mitral and aortic valves: Secondary | ICD-10-CM | POA: Diagnosis not present

## 2022-06-08 DIAGNOSIS — I739 Peripheral vascular disease, unspecified: Secondary | ICD-10-CM | POA: Diagnosis not present

## 2022-06-08 DIAGNOSIS — I890 Lymphedema, not elsewhere classified: Secondary | ICD-10-CM | POA: Diagnosis not present

## 2022-06-08 DIAGNOSIS — M1611 Unilateral primary osteoarthritis, right hip: Secondary | ICD-10-CM | POA: Diagnosis not present

## 2022-06-08 DIAGNOSIS — G8929 Other chronic pain: Secondary | ICD-10-CM | POA: Diagnosis not present

## 2022-06-08 DIAGNOSIS — M48061 Spinal stenosis, lumbar region without neurogenic claudication: Secondary | ICD-10-CM | POA: Diagnosis not present

## 2022-06-08 DIAGNOSIS — G629 Polyneuropathy, unspecified: Secondary | ICD-10-CM | POA: Diagnosis not present

## 2022-06-08 DIAGNOSIS — K219 Gastro-esophageal reflux disease without esophagitis: Secondary | ICD-10-CM | POA: Diagnosis not present

## 2022-06-08 NOTE — Telephone Encounter (Signed)
Lorrana called from [facility] home health said did a regular visit today on patient, patient had blister open over night and needs to change nurse visit to increase frequency nursing, needs a verbal order and legs are swelling more.  Nurse did wound care to patient legs, need verbal order 2 week 6 and 2 prn. Call back # [phone].

## 2022-06-09 DIAGNOSIS — I739 Peripheral vascular disease, unspecified: Secondary | ICD-10-CM | POA: Diagnosis not present

## 2022-06-09 DIAGNOSIS — K219 Gastro-esophageal reflux disease without esophagitis: Secondary | ICD-10-CM | POA: Diagnosis not present

## 2022-06-09 DIAGNOSIS — I080 Rheumatic disorders of both mitral and aortic valves: Secondary | ICD-10-CM | POA: Diagnosis not present

## 2022-06-09 DIAGNOSIS — M48061 Spinal stenosis, lumbar region without neurogenic claudication: Secondary | ICD-10-CM | POA: Diagnosis not present

## 2022-06-09 DIAGNOSIS — M1611 Unilateral primary osteoarthritis, right hip: Secondary | ICD-10-CM | POA: Diagnosis not present

## 2022-06-09 DIAGNOSIS — I82509 Chronic embolism and thrombosis of unspecified deep veins of unspecified lower extremity: Secondary | ICD-10-CM | POA: Diagnosis not present

## 2022-06-09 DIAGNOSIS — G8929 Other chronic pain: Secondary | ICD-10-CM | POA: Diagnosis not present

## 2022-06-09 DIAGNOSIS — I890 Lymphedema, not elsewhere classified: Secondary | ICD-10-CM | POA: Diagnosis not present

## 2022-06-09 DIAGNOSIS — G629 Polyneuropathy, unspecified: Secondary | ICD-10-CM | POA: Diagnosis not present

## 2022-06-09 NOTE — Telephone Encounter (Signed)
Verbal order given  

## 2022-06-10 ENCOUNTER — Telehealth: Payer: Self-pay | Admitting: Family Medicine

## 2022-06-10 NOTE — Telephone Encounter (Signed)
Verbal order given  

## 2022-06-10 NOTE — Telephone Encounter (Signed)
[name] [name] from [facility] home health called needs order plan of care 1 week 6 for transfer strengthening, safety and mobility. [name] [name] call back # [phone].

## 2022-06-15 DIAGNOSIS — I080 Rheumatic disorders of both mitral and aortic valves: Secondary | ICD-10-CM | POA: Diagnosis not present

## 2022-06-15 DIAGNOSIS — I739 Peripheral vascular disease, unspecified: Secondary | ICD-10-CM | POA: Diagnosis not present

## 2022-06-15 DIAGNOSIS — M48061 Spinal stenosis, lumbar region without neurogenic claudication: Secondary | ICD-10-CM | POA: Diagnosis not present

## 2022-06-15 DIAGNOSIS — I890 Lymphedema, not elsewhere classified: Secondary | ICD-10-CM | POA: Diagnosis not present

## 2022-06-15 DIAGNOSIS — G629 Polyneuropathy, unspecified: Secondary | ICD-10-CM | POA: Diagnosis not present

## 2022-06-15 DIAGNOSIS — G8929 Other chronic pain: Secondary | ICD-10-CM | POA: Diagnosis not present

## 2022-06-15 DIAGNOSIS — I82509 Chronic embolism and thrombosis of unspecified deep veins of unspecified lower extremity: Secondary | ICD-10-CM | POA: Diagnosis not present

## 2022-06-15 DIAGNOSIS — K219 Gastro-esophageal reflux disease without esophagitis: Secondary | ICD-10-CM | POA: Diagnosis not present

## 2022-06-15 DIAGNOSIS — M1611 Unilateral primary osteoarthritis, right hip: Secondary | ICD-10-CM | POA: Diagnosis not present

## 2022-06-16 ENCOUNTER — Ambulatory Visit: Payer: Medicare HMO | Admitting: Podiatry

## 2022-06-17 DIAGNOSIS — K219 Gastro-esophageal reflux disease without esophagitis: Secondary | ICD-10-CM | POA: Diagnosis not present

## 2022-06-17 DIAGNOSIS — G629 Polyneuropathy, unspecified: Secondary | ICD-10-CM | POA: Diagnosis not present

## 2022-06-17 DIAGNOSIS — M48061 Spinal stenosis, lumbar region without neurogenic claudication: Secondary | ICD-10-CM | POA: Diagnosis not present

## 2022-06-17 DIAGNOSIS — I739 Peripheral vascular disease, unspecified: Secondary | ICD-10-CM | POA: Diagnosis not present

## 2022-06-17 DIAGNOSIS — I080 Rheumatic disorders of both mitral and aortic valves: Secondary | ICD-10-CM | POA: Diagnosis not present

## 2022-06-17 DIAGNOSIS — G8929 Other chronic pain: Secondary | ICD-10-CM | POA: Diagnosis not present

## 2022-06-17 DIAGNOSIS — M1611 Unilateral primary osteoarthritis, right hip: Secondary | ICD-10-CM | POA: Diagnosis not present

## 2022-06-17 DIAGNOSIS — I890 Lymphedema, not elsewhere classified: Secondary | ICD-10-CM | POA: Diagnosis not present

## 2022-06-17 DIAGNOSIS — I82509 Chronic embolism and thrombosis of unspecified deep veins of unspecified lower extremity: Secondary | ICD-10-CM | POA: Diagnosis not present

## 2022-06-18 ENCOUNTER — Telehealth: Payer: Self-pay | Admitting: Family Medicine

## 2022-06-18 DIAGNOSIS — G629 Polyneuropathy, unspecified: Secondary | ICD-10-CM | POA: Diagnosis not present

## 2022-06-18 DIAGNOSIS — K219 Gastro-esophageal reflux disease without esophagitis: Secondary | ICD-10-CM | POA: Diagnosis not present

## 2022-06-18 DIAGNOSIS — M1611 Unilateral primary osteoarthritis, right hip: Secondary | ICD-10-CM | POA: Diagnosis not present

## 2022-06-18 DIAGNOSIS — I080 Rheumatic disorders of both mitral and aortic valves: Secondary | ICD-10-CM | POA: Diagnosis not present

## 2022-06-18 DIAGNOSIS — M48061 Spinal stenosis, lumbar region without neurogenic claudication: Secondary | ICD-10-CM | POA: Diagnosis not present

## 2022-06-18 DIAGNOSIS — G8929 Other chronic pain: Secondary | ICD-10-CM | POA: Diagnosis not present

## 2022-06-18 DIAGNOSIS — I82509 Chronic embolism and thrombosis of unspecified deep veins of unspecified lower extremity: Secondary | ICD-10-CM | POA: Diagnosis not present

## 2022-06-18 DIAGNOSIS — I739 Peripheral vascular disease, unspecified: Secondary | ICD-10-CM | POA: Diagnosis not present

## 2022-06-18 DIAGNOSIS — I890 Lymphedema, not elsewhere classified: Secondary | ICD-10-CM | POA: Diagnosis not present

## 2022-06-18 NOTE — Telephone Encounter (Signed)
[name] [name], [place] stating pt has a possible busted blood vessell in her rt eye, it is really. No pain no blurred vision.

## 2022-06-22 ENCOUNTER — Telehealth: Payer: Self-pay | Admitting: Family Medicine

## 2022-06-22 DIAGNOSIS — I739 Peripheral vascular disease, unspecified: Secondary | ICD-10-CM | POA: Diagnosis not present

## 2022-06-22 DIAGNOSIS — I080 Rheumatic disorders of both mitral and aortic valves: Secondary | ICD-10-CM | POA: Diagnosis not present

## 2022-06-22 DIAGNOSIS — K219 Gastro-esophageal reflux disease without esophagitis: Secondary | ICD-10-CM | POA: Diagnosis not present

## 2022-06-22 DIAGNOSIS — M1611 Unilateral primary osteoarthritis, right hip: Secondary | ICD-10-CM | POA: Diagnosis not present

## 2022-06-22 DIAGNOSIS — I82509 Chronic embolism and thrombosis of unspecified deep veins of unspecified lower extremity: Secondary | ICD-10-CM | POA: Diagnosis not present

## 2022-06-22 DIAGNOSIS — I890 Lymphedema, not elsewhere classified: Secondary | ICD-10-CM | POA: Diagnosis not present

## 2022-06-22 DIAGNOSIS — G629 Polyneuropathy, unspecified: Secondary | ICD-10-CM | POA: Diagnosis not present

## 2022-06-22 DIAGNOSIS — M48061 Spinal stenosis, lumbar region without neurogenic claudication: Secondary | ICD-10-CM | POA: Diagnosis not present

## 2022-06-22 DIAGNOSIS — G8929 Other chronic pain: Secondary | ICD-10-CM | POA: Diagnosis not present

## 2022-06-22 NOTE — Telephone Encounter (Signed)
Pt returned call to office.

## 2022-06-23 DIAGNOSIS — I080 Rheumatic disorders of both mitral and aortic valves: Secondary | ICD-10-CM | POA: Diagnosis not present

## 2022-06-23 DIAGNOSIS — G629 Polyneuropathy, unspecified: Secondary | ICD-10-CM | POA: Diagnosis not present

## 2022-06-23 DIAGNOSIS — G8929 Other chronic pain: Secondary | ICD-10-CM | POA: Diagnosis not present

## 2022-06-23 DIAGNOSIS — I890 Lymphedema, not elsewhere classified: Secondary | ICD-10-CM | POA: Diagnosis not present

## 2022-06-23 DIAGNOSIS — K219 Gastro-esophageal reflux disease without esophagitis: Secondary | ICD-10-CM | POA: Diagnosis not present

## 2022-06-23 DIAGNOSIS — I82509 Chronic embolism and thrombosis of unspecified deep veins of unspecified lower extremity: Secondary | ICD-10-CM | POA: Diagnosis not present

## 2022-06-23 DIAGNOSIS — M1611 Unilateral primary osteoarthritis, right hip: Secondary | ICD-10-CM | POA: Diagnosis not present

## 2022-06-23 DIAGNOSIS — M48061 Spinal stenosis, lumbar region without neurogenic claudication: Secondary | ICD-10-CM | POA: Diagnosis not present

## 2022-06-23 DIAGNOSIS — I739 Peripheral vascular disease, unspecified: Secondary | ICD-10-CM | POA: Diagnosis not present

## 2022-06-24 ENCOUNTER — Telehealth: Payer: Self-pay | Admitting: Family Medicine

## 2022-06-24 NOTE — Telephone Encounter (Signed)
[facility], [place], [phone]   Wants OT orders for 1x a wk for 4 wks

## 2022-06-25 ENCOUNTER — Encounter: Payer: Self-pay | Admitting: Podiatry

## 2022-06-25 ENCOUNTER — Ambulatory Visit (INDEPENDENT_AMBULATORY_CARE_PROVIDER_SITE_OTHER): Payer: Medicare HMO | Admitting: Podiatry

## 2022-06-25 DIAGNOSIS — I739 Peripheral vascular disease, unspecified: Secondary | ICD-10-CM | POA: Diagnosis not present

## 2022-06-25 DIAGNOSIS — S90111A Contusion of right great toe without damage to nail, initial encounter: Secondary | ICD-10-CM

## 2022-06-25 DIAGNOSIS — G629 Polyneuropathy, unspecified: Secondary | ICD-10-CM | POA: Diagnosis not present

## 2022-06-25 DIAGNOSIS — M48061 Spinal stenosis, lumbar region without neurogenic claudication: Secondary | ICD-10-CM | POA: Diagnosis not present

## 2022-06-25 DIAGNOSIS — I080 Rheumatic disorders of both mitral and aortic valves: Secondary | ICD-10-CM | POA: Diagnosis not present

## 2022-06-25 DIAGNOSIS — L89619 Pressure ulcer of right heel, unspecified stage: Secondary | ICD-10-CM

## 2022-06-25 DIAGNOSIS — I890 Lymphedema, not elsewhere classified: Secondary | ICD-10-CM | POA: Diagnosis not present

## 2022-06-25 DIAGNOSIS — L89629 Pressure ulcer of left heel, unspecified stage: Secondary | ICD-10-CM | POA: Diagnosis not present

## 2022-06-25 DIAGNOSIS — I82509 Chronic embolism and thrombosis of unspecified deep veins of unspecified lower extremity: Secondary | ICD-10-CM | POA: Diagnosis not present

## 2022-06-25 DIAGNOSIS — M1611 Unilateral primary osteoarthritis, right hip: Secondary | ICD-10-CM | POA: Diagnosis not present

## 2022-06-25 DIAGNOSIS — K219 Gastro-esophageal reflux disease without esophagitis: Secondary | ICD-10-CM | POA: Diagnosis not present

## 2022-06-25 DIAGNOSIS — G8929 Other chronic pain: Secondary | ICD-10-CM | POA: Diagnosis not present

## 2022-06-25 NOTE — Telephone Encounter (Signed)
VERBAL OK LEFT ON VM

## 2022-06-29 DIAGNOSIS — K219 Gastro-esophageal reflux disease without esophagitis: Secondary | ICD-10-CM | POA: Diagnosis not present

## 2022-06-29 DIAGNOSIS — I739 Peripheral vascular disease, unspecified: Secondary | ICD-10-CM | POA: Diagnosis not present

## 2022-06-29 DIAGNOSIS — I82509 Chronic embolism and thrombosis of unspecified deep veins of unspecified lower extremity: Secondary | ICD-10-CM | POA: Diagnosis not present

## 2022-06-29 DIAGNOSIS — M1611 Unilateral primary osteoarthritis, right hip: Secondary | ICD-10-CM | POA: Diagnosis not present

## 2022-06-29 DIAGNOSIS — I080 Rheumatic disorders of both mitral and aortic valves: Secondary | ICD-10-CM | POA: Diagnosis not present

## 2022-06-29 DIAGNOSIS — M48061 Spinal stenosis, lumbar region without neurogenic claudication: Secondary | ICD-10-CM | POA: Diagnosis not present

## 2022-06-29 DIAGNOSIS — G8929 Other chronic pain: Secondary | ICD-10-CM | POA: Diagnosis not present

## 2022-06-29 DIAGNOSIS — I890 Lymphedema, not elsewhere classified: Secondary | ICD-10-CM | POA: Diagnosis not present

## 2022-06-29 DIAGNOSIS — G629 Polyneuropathy, unspecified: Secondary | ICD-10-CM | POA: Diagnosis not present

## 2022-06-29 NOTE — Progress Notes (Signed)
  Subjective:  Patient ID: [name] [name], female    DOB: 29-Sep-1932,  MRN: [id number]  Chief Complaint  Patient presents with   Toe Pain    great toe pain; right foot, could be nail, could be ulcer. Patient is taking Eliquis. Wound care is coming to her house twice a week to change her leg dressings. She developed cellulitis in her legs and she thinks it came from that right big toe    89 y.o. female presents with the above complaint. History confirmed with patient.  She is not sure how she ended the big toe does not recall any particular traumatic event  Objective:  Physical Exam: warm, good capillary refill, normal sensory exam, and bilateral lower extremities with compression wraps on unable to fully assess DP and PT pulse, bruising on distal right hallux with nail dystrophy.       Assessment:   1. Pressure ulcer of both heels   2. Contusion of right great toe without damage to nail, initial encounter      Plan:  Patient was evaluated and treated and all questions answered.  The contusion of the right hallux distal tip, it should resolve uneventfully may wear regular shoes as tolerated.  She also has bilateral heel ulcerations has a home health care nurse that is changing dressings.  I recommended a referral to wound care to evaluate and have continued care, referral was placed for this and I will see her back as needed if it does not improve or worsens. Reviewed if she has a Return if symptoms worsen or fail to improve.

## 2022-06-30 DIAGNOSIS — G629 Polyneuropathy, unspecified: Secondary | ICD-10-CM | POA: Diagnosis not present

## 2022-06-30 DIAGNOSIS — K219 Gastro-esophageal reflux disease without esophagitis: Secondary | ICD-10-CM | POA: Diagnosis not present

## 2022-06-30 DIAGNOSIS — G8929 Other chronic pain: Secondary | ICD-10-CM | POA: Diagnosis not present

## 2022-06-30 DIAGNOSIS — I890 Lymphedema, not elsewhere classified: Secondary | ICD-10-CM | POA: Diagnosis not present

## 2022-06-30 DIAGNOSIS — I82509 Chronic embolism and thrombosis of unspecified deep veins of unspecified lower extremity: Secondary | ICD-10-CM | POA: Diagnosis not present

## 2022-06-30 DIAGNOSIS — I080 Rheumatic disorders of both mitral and aortic valves: Secondary | ICD-10-CM | POA: Diagnosis not present

## 2022-06-30 DIAGNOSIS — I739 Peripheral vascular disease, unspecified: Secondary | ICD-10-CM | POA: Diagnosis not present

## 2022-06-30 DIAGNOSIS — M1611 Unilateral primary osteoarthritis, right hip: Secondary | ICD-10-CM | POA: Diagnosis not present

## 2022-06-30 DIAGNOSIS — M48061 Spinal stenosis, lumbar region without neurogenic claudication: Secondary | ICD-10-CM | POA: Diagnosis not present

## 2022-07-01 DIAGNOSIS — G8929 Other chronic pain: Secondary | ICD-10-CM | POA: Diagnosis not present

## 2022-07-01 DIAGNOSIS — I739 Peripheral vascular disease, unspecified: Secondary | ICD-10-CM | POA: Diagnosis not present

## 2022-07-01 DIAGNOSIS — I080 Rheumatic disorders of both mitral and aortic valves: Secondary | ICD-10-CM | POA: Diagnosis not present

## 2022-07-01 DIAGNOSIS — I890 Lymphedema, not elsewhere classified: Secondary | ICD-10-CM | POA: Diagnosis not present

## 2022-07-01 DIAGNOSIS — I82509 Chronic embolism and thrombosis of unspecified deep veins of unspecified lower extremity: Secondary | ICD-10-CM | POA: Diagnosis not present

## 2022-07-01 DIAGNOSIS — G629 Polyneuropathy, unspecified: Secondary | ICD-10-CM | POA: Diagnosis not present

## 2022-07-01 DIAGNOSIS — M48061 Spinal stenosis, lumbar region without neurogenic claudication: Secondary | ICD-10-CM | POA: Diagnosis not present

## 2022-07-01 DIAGNOSIS — K219 Gastro-esophageal reflux disease without esophagitis: Secondary | ICD-10-CM | POA: Diagnosis not present

## 2022-07-01 DIAGNOSIS — M1611 Unilateral primary osteoarthritis, right hip: Secondary | ICD-10-CM | POA: Diagnosis not present

## 2022-07-03 DIAGNOSIS — G8929 Other chronic pain: Secondary | ICD-10-CM | POA: Diagnosis not present

## 2022-07-03 DIAGNOSIS — I080 Rheumatic disorders of both mitral and aortic valves: Secondary | ICD-10-CM | POA: Diagnosis not present

## 2022-07-03 DIAGNOSIS — I739 Peripheral vascular disease, unspecified: Secondary | ICD-10-CM | POA: Diagnosis not present

## 2022-07-03 DIAGNOSIS — M1611 Unilateral primary osteoarthritis, right hip: Secondary | ICD-10-CM | POA: Diagnosis not present

## 2022-07-03 DIAGNOSIS — M48061 Spinal stenosis, lumbar region without neurogenic claudication: Secondary | ICD-10-CM | POA: Diagnosis not present

## 2022-07-03 DIAGNOSIS — K219 Gastro-esophageal reflux disease without esophagitis: Secondary | ICD-10-CM | POA: Diagnosis not present

## 2022-07-03 DIAGNOSIS — I82509 Chronic embolism and thrombosis of unspecified deep veins of unspecified lower extremity: Secondary | ICD-10-CM | POA: Diagnosis not present

## 2022-07-03 DIAGNOSIS — I890 Lymphedema, not elsewhere classified: Secondary | ICD-10-CM | POA: Diagnosis not present

## 2022-07-03 DIAGNOSIS — G629 Polyneuropathy, unspecified: Secondary | ICD-10-CM | POA: Diagnosis not present

## 2022-07-06 DIAGNOSIS — M48061 Spinal stenosis, lumbar region without neurogenic claudication: Secondary | ICD-10-CM | POA: Diagnosis not present

## 2022-07-06 DIAGNOSIS — I739 Peripheral vascular disease, unspecified: Secondary | ICD-10-CM | POA: Diagnosis not present

## 2022-07-06 DIAGNOSIS — K219 Gastro-esophageal reflux disease without esophagitis: Secondary | ICD-10-CM | POA: Diagnosis not present

## 2022-07-06 DIAGNOSIS — G8929 Other chronic pain: Secondary | ICD-10-CM | POA: Diagnosis not present

## 2022-07-06 DIAGNOSIS — I890 Lymphedema, not elsewhere classified: Secondary | ICD-10-CM | POA: Diagnosis not present

## 2022-07-06 DIAGNOSIS — I82509 Chronic embolism and thrombosis of unspecified deep veins of unspecified lower extremity: Secondary | ICD-10-CM | POA: Diagnosis not present

## 2022-07-06 DIAGNOSIS — M1611 Unilateral primary osteoarthritis, right hip: Secondary | ICD-10-CM | POA: Diagnosis not present

## 2022-07-06 DIAGNOSIS — G629 Polyneuropathy, unspecified: Secondary | ICD-10-CM | POA: Diagnosis not present

## 2022-07-06 DIAGNOSIS — I080 Rheumatic disorders of both mitral and aortic valves: Secondary | ICD-10-CM | POA: Diagnosis not present

## 2022-07-07 DIAGNOSIS — I739 Peripheral vascular disease, unspecified: Secondary | ICD-10-CM | POA: Diagnosis not present

## 2022-07-07 DIAGNOSIS — I890 Lymphedema, not elsewhere classified: Secondary | ICD-10-CM | POA: Diagnosis not present

## 2022-07-07 DIAGNOSIS — K219 Gastro-esophageal reflux disease without esophagitis: Secondary | ICD-10-CM | POA: Diagnosis not present

## 2022-07-07 DIAGNOSIS — M48061 Spinal stenosis, lumbar region without neurogenic claudication: Secondary | ICD-10-CM | POA: Diagnosis not present

## 2022-07-07 DIAGNOSIS — G629 Polyneuropathy, unspecified: Secondary | ICD-10-CM | POA: Diagnosis not present

## 2022-07-07 DIAGNOSIS — M1611 Unilateral primary osteoarthritis, right hip: Secondary | ICD-10-CM | POA: Diagnosis not present

## 2022-07-07 DIAGNOSIS — G8929 Other chronic pain: Secondary | ICD-10-CM | POA: Diagnosis not present

## 2022-07-07 DIAGNOSIS — I82509 Chronic embolism and thrombosis of unspecified deep veins of unspecified lower extremity: Secondary | ICD-10-CM | POA: Diagnosis not present

## 2022-07-07 DIAGNOSIS — I080 Rheumatic disorders of both mitral and aortic valves: Secondary | ICD-10-CM | POA: Diagnosis not present

## 2022-07-08 ENCOUNTER — Encounter: Payer: Self-pay | Admitting: Family Medicine

## 2022-07-08 ENCOUNTER — Ambulatory Visit (INDEPENDENT_AMBULATORY_CARE_PROVIDER_SITE_OTHER): Payer: Medicare HMO | Admitting: Family Medicine

## 2022-07-08 ENCOUNTER — Other Ambulatory Visit: Payer: Medicare HMO

## 2022-07-08 VITALS — BP 111/76 | HR 76 | Resp 16

## 2022-07-08 DIAGNOSIS — I10 Essential (primary) hypertension: Secondary | ICD-10-CM

## 2022-07-08 DIAGNOSIS — D539 Nutritional anemia, unspecified: Secondary | ICD-10-CM

## 2022-07-08 DIAGNOSIS — I825Z2 Chronic embolism and thrombosis of unspecified deep veins of left distal lower extremity: Secondary | ICD-10-CM | POA: Diagnosis not present

## 2022-07-08 DIAGNOSIS — R600 Localized edema: Secondary | ICD-10-CM

## 2022-07-08 DIAGNOSIS — G7249 Other inflammatory and immune myopathies, not elsewhere classified: Secondary | ICD-10-CM | POA: Diagnosis not present

## 2022-07-08 DIAGNOSIS — E785 Hyperlipidemia, unspecified: Secondary | ICD-10-CM

## 2022-07-08 DIAGNOSIS — D649 Anemia, unspecified: Secondary | ICD-10-CM

## 2022-07-08 DIAGNOSIS — R296 Repeated falls: Secondary | ICD-10-CM | POA: Diagnosis not present

## 2022-07-08 DIAGNOSIS — E8809 Other disorders of plasma-protein metabolism, not elsewhere classified: Secondary | ICD-10-CM | POA: Diagnosis not present

## 2022-07-08 NOTE — Patient Instructions (Signed)
F/U in 4 months, call if you needme sooner  Labs today orderdd by hematology  Careful not to fall  Thanks for choosing [facility], we consider it a privelige to serve you. '

## 2022-07-09 ENCOUNTER — Ambulatory Visit: Payer: Medicare HMO | Admitting: Family Medicine

## 2022-07-10 DIAGNOSIS — G629 Polyneuropathy, unspecified: Secondary | ICD-10-CM | POA: Diagnosis not present

## 2022-07-10 DIAGNOSIS — I080 Rheumatic disorders of both mitral and aortic valves: Secondary | ICD-10-CM | POA: Diagnosis not present

## 2022-07-10 DIAGNOSIS — I82509 Chronic embolism and thrombosis of unspecified deep veins of unspecified lower extremity: Secondary | ICD-10-CM | POA: Diagnosis not present

## 2022-07-10 DIAGNOSIS — I890 Lymphedema, not elsewhere classified: Secondary | ICD-10-CM | POA: Diagnosis not present

## 2022-07-10 DIAGNOSIS — K219 Gastro-esophageal reflux disease without esophagitis: Secondary | ICD-10-CM | POA: Diagnosis not present

## 2022-07-10 DIAGNOSIS — I739 Peripheral vascular disease, unspecified: Secondary | ICD-10-CM | POA: Diagnosis not present

## 2022-07-10 DIAGNOSIS — G8929 Other chronic pain: Secondary | ICD-10-CM | POA: Diagnosis not present

## 2022-07-10 DIAGNOSIS — M48061 Spinal stenosis, lumbar region without neurogenic claudication: Secondary | ICD-10-CM | POA: Diagnosis not present

## 2022-07-10 DIAGNOSIS — M1611 Unilateral primary osteoarthritis, right hip: Secondary | ICD-10-CM | POA: Diagnosis not present

## 2022-07-10 LAB — CBC WITH DIFFERENTIAL/PLATELET
Basophils Absolute: 0 10*3/uL (ref 0.0–0.2)
Basos: 0 %
EOS (ABSOLUTE): 0.4 10*3/uL (ref 0.0–0.4)
Eos: 5 %
Hematocrit: 34.6 % (ref 34.0–46.6)
Hemoglobin: 11 g/dL — ABNORMAL LOW (ref 11.1–15.9)
Immature Grans (Abs): 0 10*3/uL (ref 0.0–0.1)
Immature Granulocytes: 0 %
Lymphocytes Absolute: 1 10*3/uL (ref 0.7–3.1)
Lymphs: 12 %
MCH: 29.4 pg (ref 26.6–33.0)
MCHC: 31.8 g/dL (ref 31.5–35.7)
MCV: 93 fL (ref 79–97)
Monocytes Absolute: 0.8 10*3/uL (ref 0.1–0.9)
Monocytes: 9 %
Neutrophils Absolute: 6 10*3/uL (ref 1.4–7.0)
Neutrophils: 74 %
Platelets: 215 10*3/uL (ref 150–450)
RBC: 3.74 x10E6/uL — ABNORMAL LOW (ref 3.77–5.28)
RDW: 15.2 % (ref 11.7–15.4)
WBC: 8.2 10*3/uL (ref 3.4–10.8)

## 2022-07-10 LAB — BMP8+EGFR
BUN/Creatinine Ratio: 29 — ABNORMAL HIGH (ref 12–28)
BUN: 25 mg/dL (ref 8–27)
CO2: 28 mmol/L (ref 20–29)
Calcium: 9.3 mg/dL (ref 8.7–10.3)
Chloride: 102 mmol/L (ref 96–106)
Creatinine, Ser: 0.85 mg/dL (ref 0.57–1.00)
Glucose: 86 mg/dL (ref 70–99)
Potassium: 4.1 mmol/L (ref 3.5–5.2)
Sodium: 147 mmol/L — ABNORMAL HIGH (ref 134–144)
eGFR: 66 mL/min/{1.73_m2} (ref >59)

## 2022-07-10 LAB — IRON AND TIBC
Iron Saturation: 14 % — ABNORMAL LOW (ref 15–55)
Iron: 41 ug/dL (ref 27–139)
Total Iron Binding Capacity: 292 ug/dL (ref 250–450)
UIBC: 251 ug/dL (ref 118–369)

## 2022-07-10 LAB — FERRITIN: Ferritin: 345 ng/mL — ABNORMAL HIGH (ref 15–150)

## 2022-07-13 ENCOUNTER — Ambulatory Visit (HOSPITAL_COMMUNITY): Payer: Medicare HMO | Admitting: Physical Therapy

## 2022-07-13 ENCOUNTER — Encounter: Payer: Self-pay | Admitting: Family Medicine

## 2022-07-13 ENCOUNTER — Telehealth: Payer: Self-pay | Admitting: Family Medicine

## 2022-07-13 ENCOUNTER — Inpatient Hospital Stay: Payer: Medicare HMO

## 2022-07-13 DIAGNOSIS — M48061 Spinal stenosis, lumbar region without neurogenic claudication: Secondary | ICD-10-CM | POA: Diagnosis not present

## 2022-07-13 DIAGNOSIS — G629 Polyneuropathy, unspecified: Secondary | ICD-10-CM | POA: Diagnosis not present

## 2022-07-13 DIAGNOSIS — M1611 Unilateral primary osteoarthritis, right hip: Secondary | ICD-10-CM | POA: Diagnosis not present

## 2022-07-13 DIAGNOSIS — I890 Lymphedema, not elsewhere classified: Secondary | ICD-10-CM | POA: Diagnosis not present

## 2022-07-13 DIAGNOSIS — I080 Rheumatic disorders of both mitral and aortic valves: Secondary | ICD-10-CM | POA: Diagnosis not present

## 2022-07-13 DIAGNOSIS — I82509 Chronic embolism and thrombosis of unspecified deep veins of unspecified lower extremity: Secondary | ICD-10-CM | POA: Diagnosis not present

## 2022-07-13 DIAGNOSIS — G8929 Other chronic pain: Secondary | ICD-10-CM | POA: Diagnosis not present

## 2022-07-13 DIAGNOSIS — I739 Peripheral vascular disease, unspecified: Secondary | ICD-10-CM | POA: Diagnosis not present

## 2022-07-13 DIAGNOSIS — K219 Gastro-esophageal reflux disease without esophagitis: Secondary | ICD-10-CM | POA: Diagnosis not present

## 2022-07-13 NOTE — Assessment & Plan Note (Signed)
Maintained on oral predniosne

## 2022-07-13 NOTE — Assessment & Plan Note (Signed)
Update labs for hematology, same ordred and will be forwarded to the Provider

## 2022-07-13 NOTE — Telephone Encounter (Signed)
Please follow up with pt on meds.She is confused on what she needs to be taking.

## 2022-07-13 NOTE — Assessment & Plan Note (Signed)
Maintained in compression wrap and has h/h nurse twice weekly evaluation

## 2022-07-13 NOTE — Assessment & Plan Note (Signed)
Encouraged improved nutrition to aid with overall health and healing of ulcers

## 2022-07-13 NOTE — Progress Notes (Signed)
   [name] [name]     MRN: [id number]      DOB: Jun 04, 1932   HPI [name] [name] is here for follow up and re-evaluation of chronic medical conditions, medication management and review of any available recent lab and radiology data.  Has been in SNF, [facility] since 04/08/2022 , has been back home and gets assistance for 4 hours per day Wound care has been coming to her home twice weekly and her legs are kept wrapped, followed by Podiatry for ulcers C/o excess wetting on herself, has limited mobility and is maintained on lasix, no additional med desired for the problem which is chronic Requests hematology labs be drawn in office to negate the need to leave her home twice in quick succession C/o blisters on her legs getting better slowly, HHN visits twice weekly  ROS Denies recent fever or chills. Denies sinus pressure, nasal congestion, ear pain or sore throat. Denies chest congestion, productive cough or wheezing. Denies chest pains, palpitations and leg swelling Denies abdominal pain, nausea, vomiting,diarrhea or constipation.   Denies dysuria, frequency, hesitancy or incontinence.  C/o mild  depression, following loss of her son, still grieving which is not inappropriate, denies anxiety or insomnia.  PE  BP 111/76   Pulse 76   Resp 16   SpO2 94%   Patient alert and oriented and in no cardiopulmonary distress.  HEENT: No facial asymmetry, EOMI,     Neck decreased  .  Chest: Clear to auscultation bilaterally.Decreased air entry bilaterally  CVS: S1, S2 systolic  murmur, no S3.Regular rate.  ABD: Soft non tender.   MS: Adequate ROM spine, shoulders, hips and knees. Skin: leg ulcers not visualized as legs are wrapped  Psych: Good eye contact, normal affect. Mildly  depressed appearing.  CNS: CN 2-12 intact, t.no focal deficits noted.   Assessment & Plan  Chronic deep vein thrombosis (DVT) of distal vein of left lower extremity (HCC) Maintained on eliquis  Inflammatory  myopathy Maintained on oral predniosne  Recurrent falls Home safety reviewed , no falls reported since her return home  Normocytic anemia Update labs for hematology, same ordred and will be forwarded to the Provider  Hyperlipidemia LDL goal <100 Hyperlipidemia:Low fat diet discussed and encouraged.   Lipid Panel  Lab Results  Component Value Date   CHOL 197 04/03/2022   HDL 107 04/03/2022   LDLCALC 76 04/03/2022   TRIG 77 04/03/2022   CHOLHDL 1.8 04/03/2022     Controlled, no change in medication   Bilateral lower extremity edema Maintained in compression wrap and has h/h nurse twice weekly evaluation  Hypoalbuminemia Encouraged improved nutrition to aid with overall health and healing of ulcers

## 2022-07-13 NOTE — Assessment & Plan Note (Signed)
Hyperlipidemia:Low fat diet discussed and encouraged.   Lipid Panel  Lab Results  Component Value Date   CHOL 197 04/03/2022   HDL 107 04/03/2022   LDLCALC 76 04/03/2022   TRIG 77 04/03/2022   CHOLHDL 1.8 04/03/2022     Controlled, no change in medication

## 2022-07-13 NOTE — Assessment & Plan Note (Signed)
Maintained on eliquis 

## 2022-07-13 NOTE — Assessment & Plan Note (Signed)
Home safety reviewed , no falls reported since her return home

## 2022-07-14 ENCOUNTER — Other Ambulatory Visit: Payer: Self-pay

## 2022-07-14 NOTE — Telephone Encounter (Signed)
States she doesn't have an antibiotic and her kids thinks she should be on one with her legs like they are. Should she contact another provider about this or what does she need to do

## 2022-07-14 NOTE — Telephone Encounter (Signed)
I spoke with pt, I am the only Provider supervising care of her legs currently States overall improving, however, in [past 1 week , out 6 weeks slight inc in drainage and redness. I will wait to hear from [facility] nurse when they evaluate before rx an antibiotic, she agrees

## 2022-07-15 ENCOUNTER — Inpatient Hospital Stay: Payer: Medicare HMO | Attending: Physician Assistant | Admitting: Physician Assistant

## 2022-07-15 ENCOUNTER — Other Ambulatory Visit: Payer: Self-pay

## 2022-07-15 ENCOUNTER — Encounter: Payer: Self-pay | Admitting: Physician Assistant

## 2022-07-15 DIAGNOSIS — D649 Anemia, unspecified: Secondary | ICD-10-CM | POA: Diagnosis not present

## 2022-07-15 DIAGNOSIS — I82532 Chronic embolism and thrombosis of left popliteal vein: Secondary | ICD-10-CM

## 2022-07-15 DIAGNOSIS — C549 Malignant neoplasm of corpus uteri, unspecified: Secondary | ICD-10-CM

## 2022-07-15 DIAGNOSIS — E559 Vitamin D deficiency, unspecified: Secondary | ICD-10-CM

## 2022-07-15 NOTE — Progress Notes (Signed)
VIRTUAL VISIT via TELEPHONE NOTE [facility]   I connected with [name] [name]  on 07/15/22 at  2:20 PM by telephone and verified that I am speaking with the correct person using two identifiers.  Location: Patient: Home Provider: [facility]   I discussed the limitations, risks, security and privacy concerns of performing an evaluation and management service by telephone and the availability of in person appointments. I also discussed with the patient that there may be a patient responsible charge related to this service. The patient expressed understanding and agreed to proceed.  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 89 y.o. 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  She was last evaluated via telemedicine visit by Rojelio Brenner PA-C on 01/15/2022.   At today's visit, she reports feeling somewhat poorly due to a fall she had at home today when she forgot to put the brakes on her wheelchair when try to use the bathroom.  She reports that this was the only time she fell in the past year.  She lives alone, but has someone who comes to assist for 3 hours daily Monday through Friday.   She remains on Eliquis, which she is tolerating well without major bleeding events.  She reports easy bruising.  She has not had any recurrence of her maroon-colored stools since they occurred in July 2022. She denies any current symptoms of epistaxis, hematemesis, hematochezia, or melena.   She continues to have chronic bilateral leg swelling, with left greater than right likely due to post thrombotic changes.  She was treated for right leg cellulitis in January 2024.  She denies any shortness of breath, dyspnea on exertion, chest pain, cough, hemoptysis, and palpitations.  Regarding her  history of endometrial cancer, she has not noticed any recent vaginal bleeding, abdominal pain, or B symptoms.  She has very low energy and 75% appetite. She endorses that she is maintaining a stable weight.   REVIEW OF SYSTEMS:   Review of Systems  Constitutional:  Positive for malaise/fatigue. Negative for chills, diaphoresis, fever and weight loss.  Respiratory:  Negative for cough and shortness of breath.   Cardiovascular:  Positive for leg swelling. Negative for chest pain and palpitations.  Gastrointestinal:  Positive for diarrhea. Negative for abdominal pain, blood in stool, melena, nausea and vomiting.  Genitourinary:  Positive for urgency (incontinence).  Neurological:  Positive for dizziness and tingling. Negative for headaches.     PHYSICAL EXAM: (per limitations of virtual telephone visit)  The patient is alert and oriented x 3, exhibiting adequate mentation, good mood, and ability to speak in full sentences and execute sound judgement.  ASSESSMENT & PLAN:  1.  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  Mildly elevated kappa free light chain 30.8 with normal lambda and mildly elevated ratio 2.07. Normal copper, B12, MMA Ferritin 40, iron saturation 48% - Most recent labs (07/08/2022): Hgb 11.0/MCV 93, ferritin 345, iron saturation 14%.  Creatinine 0.85/GFR 66. - Iron tablet was discontinued by her PCP - No bright red blood per rectum or melena.    - PLAN: We will monitor with repeat CBC and iron panel at next visit in 6 months.  2.  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 - Repeat ultrasound on 08/05/2019 showed occlusive DVT involving left popliteal vein with extension to left posterior tibial vein and one of the paired left peroneal veins similar to 02/10/2019 imaging study - Ultrasound on 08/23/2019 showed no evidence of acute or chronic DVT within either lower extremity, with special attention to left popliteal and tibial veins.  There was noted to be a 16.6 x 3.9 x 3.5 cm complex fluid collection extending from the left popliteal fossa to the proximal mid aspect of the left calf, which was not seen on 08/05/2019 (this was found to be hemarthrosis of the left knee joint, s/p aspiration and Depo-Medrol injection by Dr. Romeo Apple on 09/08/2019 with 40 cc of aspirated blood) - Due to hemarthrosis described above, Eliquis was discontinued again on 09/05/2019 - Bilateral lower extremity ultrasound on 09/18/2019 was negative for DVT - Bilateral venous ultrasound (11/13/2019): New partially occlusive thrombus in the left popliteal vein - Patient restarted on Eliquis 11/13/2019 - Left lower extremity venous US (03/08/2020): Resolution of left lower extremity DVT - Most recent D-dimer (10/07/2021): Mildly elevated at 0.76, but no current symptoms of recurrent DVT     - At this juncture, would recommend lifelong anticoagulation, as patient has had recurrent left lower extremity DVTs each time that Eliquis has been discontinued - Patient currently taking Eliquis 5 mg twice daily, tolerating well without any symptoms of blood loss.  She did report some maroon-colored stools in July 2022, but these resolved on their own and have not recurred. - She reports 1 fall in the past year - PLAN: Continue Eliquis indefinitely.  Repeat D-dimer and CBC at follow-up. - We discussed risks versus benefits of ongoing anticoagulation, particular risks of morbidity and mortality in the setting of falls.  Patient is agreeable to continuing Eliquis, as benefits continue to outweigh risks at this  time. - Patient educated on alarm symptoms that would prompt immediate medical attention.     3.  Vitamin D deficiency - She is taking vitamin D 1000 units daily - Most recent vitamin D (01/08/2022) normal at 33.65 - PLAN: Recommend continuing vitamin D 1000 units daily supplement   - Repeat vitamin D level annually if not checked by PCP   4.  History of endometrial adenocarcinoma - She had T1b N0 well-differentiated endometrioid adenocarcinoma, FIGO grade 1, TAH/BSO with lymphadenectomy done on 12/14/2006.  She did not require any adjuvant therapy. CT scan on 03/06/2019 did not show any evidence of recurrence or metastatic disease - Denies vaginal bleeding, abdominal pain, B symptoms, unintentional weight loss  PLAN SUMMARY: >> Labs in 6 months = CBC/D, ferritin, iron/TIBC, CMP >> OFFICE visit in 6 months      I discussed the assessment and treatment plan with the patient. The patient was provided an opportunity to ask questions and all were answered. The patient agreed with the plan and demonstrated an understanding of the instructions.   The patient was advised to call back or seek an in-person evaluation if the symptoms worsen or if the condition fails to improve as anticipated.  I provided 22 minutes of non-face-to-face time during this encounter.  Carnella Guadalajara, PA-C 07/15/22 2:47 PM

## 2022-07-16 ENCOUNTER — Ambulatory Visit: Payer: Medicare HMO | Admitting: Physician Assistant

## 2022-07-16 ENCOUNTER — Other Ambulatory Visit: Payer: Medicare HMO

## 2022-07-16 DIAGNOSIS — K219 Gastro-esophageal reflux disease without esophagitis: Secondary | ICD-10-CM | POA: Diagnosis not present

## 2022-07-16 DIAGNOSIS — I739 Peripheral vascular disease, unspecified: Secondary | ICD-10-CM | POA: Diagnosis not present

## 2022-07-16 DIAGNOSIS — I82509 Chronic embolism and thrombosis of unspecified deep veins of unspecified lower extremity: Secondary | ICD-10-CM | POA: Diagnosis not present

## 2022-07-16 DIAGNOSIS — I890 Lymphedema, not elsewhere classified: Secondary | ICD-10-CM | POA: Diagnosis not present

## 2022-07-16 DIAGNOSIS — M48061 Spinal stenosis, lumbar region without neurogenic claudication: Secondary | ICD-10-CM | POA: Diagnosis not present

## 2022-07-16 DIAGNOSIS — I080 Rheumatic disorders of both mitral and aortic valves: Secondary | ICD-10-CM | POA: Diagnosis not present

## 2022-07-16 DIAGNOSIS — G629 Polyneuropathy, unspecified: Secondary | ICD-10-CM | POA: Diagnosis not present

## 2022-07-16 DIAGNOSIS — M1611 Unilateral primary osteoarthritis, right hip: Secondary | ICD-10-CM | POA: Diagnosis not present

## 2022-07-16 DIAGNOSIS — G8929 Other chronic pain: Secondary | ICD-10-CM | POA: Diagnosis not present

## 2022-07-17 DIAGNOSIS — I890 Lymphedema, not elsewhere classified: Secondary | ICD-10-CM | POA: Diagnosis not present

## 2022-07-17 DIAGNOSIS — K219 Gastro-esophageal reflux disease without esophagitis: Secondary | ICD-10-CM | POA: Diagnosis not present

## 2022-07-17 DIAGNOSIS — M1611 Unilateral primary osteoarthritis, right hip: Secondary | ICD-10-CM | POA: Diagnosis not present

## 2022-07-17 DIAGNOSIS — I82509 Chronic embolism and thrombosis of unspecified deep veins of unspecified lower extremity: Secondary | ICD-10-CM | POA: Diagnosis not present

## 2022-07-17 DIAGNOSIS — M48061 Spinal stenosis, lumbar region without neurogenic claudication: Secondary | ICD-10-CM | POA: Diagnosis not present

## 2022-07-17 DIAGNOSIS — G8929 Other chronic pain: Secondary | ICD-10-CM | POA: Diagnosis not present

## 2022-07-17 DIAGNOSIS — I739 Peripheral vascular disease, unspecified: Secondary | ICD-10-CM | POA: Diagnosis not present

## 2022-07-17 DIAGNOSIS — G629 Polyneuropathy, unspecified: Secondary | ICD-10-CM | POA: Diagnosis not present

## 2022-07-17 DIAGNOSIS — I080 Rheumatic disorders of both mitral and aortic valves: Secondary | ICD-10-CM | POA: Diagnosis not present

## 2022-07-20 ENCOUNTER — Other Ambulatory Visit: Payer: Self-pay | Admitting: Family Medicine

## 2022-07-20 DIAGNOSIS — I82509 Chronic embolism and thrombosis of unspecified deep veins of unspecified lower extremity: Secondary | ICD-10-CM | POA: Diagnosis not present

## 2022-07-20 DIAGNOSIS — M48061 Spinal stenosis, lumbar region without neurogenic claudication: Secondary | ICD-10-CM | POA: Diagnosis not present

## 2022-07-20 DIAGNOSIS — I739 Peripheral vascular disease, unspecified: Secondary | ICD-10-CM | POA: Diagnosis not present

## 2022-07-20 DIAGNOSIS — G629 Polyneuropathy, unspecified: Secondary | ICD-10-CM | POA: Diagnosis not present

## 2022-07-20 DIAGNOSIS — G8929 Other chronic pain: Secondary | ICD-10-CM | POA: Diagnosis not present

## 2022-07-20 DIAGNOSIS — I080 Rheumatic disorders of both mitral and aortic valves: Secondary | ICD-10-CM | POA: Diagnosis not present

## 2022-07-20 DIAGNOSIS — I890 Lymphedema, not elsewhere classified: Secondary | ICD-10-CM | POA: Diagnosis not present

## 2022-07-20 DIAGNOSIS — M1611 Unilateral primary osteoarthritis, right hip: Secondary | ICD-10-CM | POA: Diagnosis not present

## 2022-07-20 DIAGNOSIS — K219 Gastro-esophageal reflux disease without esophagitis: Secondary | ICD-10-CM | POA: Diagnosis not present

## 2022-07-23 ENCOUNTER — Telehealth: Payer: Self-pay | Admitting: Family Medicine

## 2022-07-23 DIAGNOSIS — I890 Lymphedema, not elsewhere classified: Secondary | ICD-10-CM | POA: Diagnosis not present

## 2022-07-23 DIAGNOSIS — G629 Polyneuropathy, unspecified: Secondary | ICD-10-CM | POA: Diagnosis not present

## 2022-07-23 DIAGNOSIS — I739 Peripheral vascular disease, unspecified: Secondary | ICD-10-CM | POA: Diagnosis not present

## 2022-07-23 DIAGNOSIS — I82509 Chronic embolism and thrombosis of unspecified deep veins of unspecified lower extremity: Secondary | ICD-10-CM | POA: Diagnosis not present

## 2022-07-23 DIAGNOSIS — G8929 Other chronic pain: Secondary | ICD-10-CM | POA: Diagnosis not present

## 2022-07-23 DIAGNOSIS — K219 Gastro-esophageal reflux disease without esophagitis: Secondary | ICD-10-CM | POA: Diagnosis not present

## 2022-07-23 DIAGNOSIS — M48061 Spinal stenosis, lumbar region without neurogenic claudication: Secondary | ICD-10-CM | POA: Diagnosis not present

## 2022-07-23 DIAGNOSIS — I080 Rheumatic disorders of both mitral and aortic valves: Secondary | ICD-10-CM | POA: Diagnosis not present

## 2022-07-23 DIAGNOSIS — M1611 Unilateral primary osteoarthritis, right hip: Secondary | ICD-10-CM | POA: Diagnosis not present

## 2022-07-23 NOTE — Telephone Encounter (Signed)
Prescription Request  07/23/2022  LOV: 07/08/2022  What is the name of the medication or equipment? furosemide (LASIX) 20 MG tablet   Have you contacted your pharmacy to request a refill? Yes   Which pharmacy would you like this sent to?   [facility] - [place], [place] - [address] [place] [address] Phone: [phone] Fax: [phone]    Patient notified that their request is being sent to the clinical staff for review and that they should receive a response within 2 business days.   Please advise at [facility] [phone]   Was initially prescribed through nursing facility

## 2022-07-24 ENCOUNTER — Other Ambulatory Visit: Payer: Self-pay

## 2022-07-24 MED ORDER — FUROSEMIDE 20 MG PO TABS: 20.0000 mg | ORAL_TABLET | Freq: Every day | ORAL | 0 refills | Status: DC

## 2022-07-24 NOTE — Telephone Encounter (Signed)
Refill sent.

## 2022-07-27 DIAGNOSIS — G8929 Other chronic pain: Secondary | ICD-10-CM | POA: Diagnosis not present

## 2022-07-27 DIAGNOSIS — I82509 Chronic embolism and thrombosis of unspecified deep veins of unspecified lower extremity: Secondary | ICD-10-CM | POA: Diagnosis not present

## 2022-07-27 DIAGNOSIS — G629 Polyneuropathy, unspecified: Secondary | ICD-10-CM | POA: Diagnosis not present

## 2022-07-27 DIAGNOSIS — M48061 Spinal stenosis, lumbar region without neurogenic claudication: Secondary | ICD-10-CM | POA: Diagnosis not present

## 2022-07-27 DIAGNOSIS — K219 Gastro-esophageal reflux disease without esophagitis: Secondary | ICD-10-CM | POA: Diagnosis not present

## 2022-07-27 DIAGNOSIS — I739 Peripheral vascular disease, unspecified: Secondary | ICD-10-CM | POA: Diagnosis not present

## 2022-07-27 DIAGNOSIS — I890 Lymphedema, not elsewhere classified: Secondary | ICD-10-CM | POA: Diagnosis not present

## 2022-07-27 DIAGNOSIS — M1611 Unilateral primary osteoarthritis, right hip: Secondary | ICD-10-CM | POA: Diagnosis not present

## 2022-07-27 DIAGNOSIS — I080 Rheumatic disorders of both mitral and aortic valves: Secondary | ICD-10-CM | POA: Diagnosis not present

## 2022-07-28 DIAGNOSIS — K219 Gastro-esophageal reflux disease without esophagitis: Secondary | ICD-10-CM | POA: Diagnosis not present

## 2022-07-28 DIAGNOSIS — I890 Lymphedema, not elsewhere classified: Secondary | ICD-10-CM | POA: Diagnosis not present

## 2022-07-28 DIAGNOSIS — I080 Rheumatic disorders of both mitral and aortic valves: Secondary | ICD-10-CM | POA: Diagnosis not present

## 2022-07-28 DIAGNOSIS — M48061 Spinal stenosis, lumbar region without neurogenic claudication: Secondary | ICD-10-CM | POA: Diagnosis not present

## 2022-07-28 DIAGNOSIS — I82509 Chronic embolism and thrombosis of unspecified deep veins of unspecified lower extremity: Secondary | ICD-10-CM | POA: Diagnosis not present

## 2022-07-28 DIAGNOSIS — I739 Peripheral vascular disease, unspecified: Secondary | ICD-10-CM | POA: Diagnosis not present

## 2022-07-28 DIAGNOSIS — G8929 Other chronic pain: Secondary | ICD-10-CM | POA: Diagnosis not present

## 2022-07-28 DIAGNOSIS — M1611 Unilateral primary osteoarthritis, right hip: Secondary | ICD-10-CM | POA: Diagnosis not present

## 2022-07-28 DIAGNOSIS — G629 Polyneuropathy, unspecified: Secondary | ICD-10-CM | POA: Diagnosis not present

## 2022-07-29 ENCOUNTER — Encounter (HOSPITAL_COMMUNITY): Payer: Self-pay | Admitting: *Deleted

## 2022-07-29 ENCOUNTER — Emergency Department (HOSPITAL_COMMUNITY): Payer: Medicare HMO

## 2022-07-29 ENCOUNTER — Emergency Department (HOSPITAL_COMMUNITY)
Admission: EM | Admit: 2022-07-29 | Discharge: 2022-07-30 | Disposition: A | Discharge status: Home / Self Care | Payer: Medicare HMO | Source: Home / Self Care | Attending: Emergency Medicine | Admitting: Emergency Medicine

## 2022-07-29 ENCOUNTER — Emergency Department (HOSPITAL_COMMUNITY)
Admission: EM | Admit: 2022-07-29 | Discharge: 2022-07-29 | Disposition: A | Discharge status: Home / Self Care | Payer: Medicare HMO | Attending: Emergency Medicine | Admitting: Emergency Medicine

## 2022-07-29 ENCOUNTER — Telehealth: Payer: Self-pay | Admitting: Family Medicine

## 2022-07-29 ENCOUNTER — Other Ambulatory Visit: Payer: Self-pay

## 2022-07-29 ENCOUNTER — Encounter (HOSPITAL_COMMUNITY): Payer: Self-pay | Admitting: Emergency Medicine

## 2022-07-29 DIAGNOSIS — Z7901 Long term (current) use of anticoagulants: Secondary | ICD-10-CM | POA: Insufficient documentation

## 2022-07-29 DIAGNOSIS — N390 Urinary tract infection, site not specified: Secondary | ICD-10-CM | POA: Diagnosis not present

## 2022-07-29 DIAGNOSIS — R4182 Altered mental status, unspecified: Secondary | ICD-10-CM | POA: Diagnosis not present

## 2022-07-29 DIAGNOSIS — E876 Hypokalemia: Secondary | ICD-10-CM | POA: Insufficient documentation

## 2022-07-29 DIAGNOSIS — N3000 Acute cystitis without hematuria: Secondary | ICD-10-CM | POA: Insufficient documentation

## 2022-07-29 DIAGNOSIS — D72829 Elevated white blood cell count, unspecified: Secondary | ICD-10-CM | POA: Insufficient documentation

## 2022-07-29 DIAGNOSIS — R5381 Other malaise: Secondary | ICD-10-CM | POA: Diagnosis not present

## 2022-07-29 DIAGNOSIS — E860 Dehydration: Secondary | ICD-10-CM | POA: Insufficient documentation

## 2022-07-29 DIAGNOSIS — I10 Essential (primary) hypertension: Secondary | ICD-10-CM | POA: Insufficient documentation

## 2022-07-29 DIAGNOSIS — R2681 Unsteadiness on feet: Secondary | ICD-10-CM | POA: Diagnosis not present

## 2022-07-29 DIAGNOSIS — R2689 Other abnormalities of gait and mobility: Secondary | ICD-10-CM | POA: Diagnosis not present

## 2022-07-29 DIAGNOSIS — I959 Hypotension, unspecified: Secondary | ICD-10-CM | POA: Diagnosis not present

## 2022-07-29 DIAGNOSIS — M6281 Muscle weakness (generalized): Secondary | ICD-10-CM | POA: Diagnosis not present

## 2022-07-29 DIAGNOSIS — J9811 Atelectasis: Secondary | ICD-10-CM | POA: Diagnosis not present

## 2022-07-29 DIAGNOSIS — Z8541 Personal history of malignant neoplasm of cervix uteri: Secondary | ICD-10-CM | POA: Insufficient documentation

## 2022-07-29 DIAGNOSIS — R531 Weakness: Secondary | ICD-10-CM | POA: Diagnosis not present

## 2022-07-29 DIAGNOSIS — R197 Diarrhea, unspecified: Secondary | ICD-10-CM | POA: Diagnosis not present

## 2022-07-29 DIAGNOSIS — W19XXXA Unspecified fall, initial encounter: Secondary | ICD-10-CM | POA: Diagnosis not present

## 2022-07-29 LAB — COMPREHENSIVE METABOLIC PANEL
ALT: 21 U/L (ref 0–44)
ALT: 21 U/L (ref 0–44)
AST: 33 U/L (ref 15–41)
AST: 38 U/L (ref 15–41)
Albumin: 3 g/dL — ABNORMAL LOW (ref 3.5–5.0)
Albumin: 3 g/dL — ABNORMAL LOW (ref 3.5–5.0)
Alkaline Phosphatase: 61 U/L (ref 38–126)
Alkaline Phosphatase: 61 U/L (ref 38–126)
Anion gap: 11 (ref 5–15)
Anion gap: 9 (ref 5–15)
BUN: 23 mg/dL (ref 8–23)
BUN: 25 mg/dL — ABNORMAL HIGH (ref 8–23)
CO2: 27 mmol/L (ref 22–32)
CO2: 26 mmol/L (ref 22–32)
Calcium: 8.5 mg/dL — ABNORMAL LOW (ref 8.9–10.3)
Calcium: 8.4 mg/dL — ABNORMAL LOW (ref 8.9–10.3)
Chloride: 102 mmol/L (ref 98–111)
Chloride: 106 mmol/L (ref 98–111)
Creatinine, Ser: 1.02 mg/dL — ABNORMAL HIGH (ref 0.44–1.00)
Creatinine, Ser: 1.02 mg/dL — ABNORMAL HIGH (ref 0.44–1.00)
GFR, Estimated: 53 mL/min — ABNORMAL LOW (ref >60)
GFR, Estimated: 53 mL/min — ABNORMAL LOW (ref >60)
Glucose, Bld: 137 mg/dL — ABNORMAL HIGH (ref 70–99)
Glucose, Bld: 124 mg/dL — ABNORMAL HIGH (ref 70–99)
Potassium: 3.1 mmol/L — ABNORMAL LOW (ref 3.5–5.1)
Potassium: 3.7 mmol/L (ref 3.5–5.1)
Sodium: 140 mmol/L (ref 135–145)
Sodium: 141 mmol/L (ref 135–145)
Total Bilirubin: 1.4 mg/dL — ABNORMAL HIGH (ref 0.3–1.2)
Total Bilirubin: 1.2 mg/dL (ref 0.3–1.2)
Total Protein: 6.1 g/dL — ABNORMAL LOW (ref 6.5–8.1)
Total Protein: 6 g/dL — ABNORMAL LOW (ref 6.5–8.1)

## 2022-07-29 LAB — CBC WITH DIFFERENTIAL/PLATELET
Abs Immature Granulocytes: 0.15 10*3/uL — ABNORMAL HIGH (ref 0.00–0.07)
Abs Immature Granulocytes: 0.1 10*3/uL — ABNORMAL HIGH (ref 0.00–0.07)
Basophils Absolute: 0 10*3/uL (ref 0.0–0.1)
Basophils Absolute: 0 10*3/uL (ref 0.0–0.1)
Basophils Relative: 0 %
Basophils Relative: 0 %
Eosinophils Absolute: 0 10*3/uL (ref 0.0–0.5)
Eosinophils Absolute: 0 10*3/uL (ref 0.0–0.5)
Eosinophils Relative: 0 %
Eosinophils Relative: 0 %
HCT: 35.3 % — ABNORMAL LOW (ref 36.0–46.0)
HCT: 34.4 % — ABNORMAL LOW (ref 36.0–46.0)
Hemoglobin: 11 g/dL — ABNORMAL LOW (ref 12.0–15.0)
Hemoglobin: 10.7 g/dL — ABNORMAL LOW (ref 12.0–15.0)
Immature Granulocytes: 1 %
Immature Granulocytes: 1 %
Lymphocytes Relative: 2 %
Lymphocytes Relative: 2 %
Lymphs Abs: 0.3 10*3/uL — ABNORMAL LOW (ref 0.7–4.0)
Lymphs Abs: 0.2 10*3/uL — ABNORMAL LOW (ref 0.7–4.0)
MCH: 30.1 pg (ref 26.0–34.0)
MCH: 30 pg (ref 26.0–34.0)
MCHC: 31.2 g/dL (ref 30.0–36.0)
MCHC: 31.1 g/dL (ref 30.0–36.0)
MCV: 96.7 fL (ref 80.0–100.0)
MCV: 96.4 fL (ref 80.0–100.0)
Monocytes Absolute: 1 10*3/uL (ref 0.1–1.0)
Monocytes Absolute: 1.1 10*3/uL — ABNORMAL HIGH (ref 0.1–1.0)
Monocytes Relative: 7 %
Monocytes Relative: 8 %
Neutro Abs: 13.1 10*3/uL — ABNORMAL HIGH (ref 1.7–7.7)
Neutro Abs: 12.9 10*3/uL — ABNORMAL HIGH (ref 1.7–7.7)
Neutrophils Relative %: 90 %
Neutrophils Relative %: 89 %
Platelets: 198 10*3/uL (ref 150–400)
Platelets: 187 10*3/uL (ref 150–400)
RBC: 3.65 MIL/uL — ABNORMAL LOW (ref 3.87–5.11)
RBC: 3.57 MIL/uL — ABNORMAL LOW (ref 3.87–5.11)
RDW: 16.2 % — ABNORMAL HIGH (ref 11.5–15.5)
RDW: 16.1 % — ABNORMAL HIGH (ref 11.5–15.5)
WBC: 14.7 10*3/uL — ABNORMAL HIGH (ref 4.0–10.5)
WBC: 14.3 10*3/uL — ABNORMAL HIGH (ref 4.0–10.5)
nRBC: 0 % (ref 0.0–0.2)
nRBC: 0 % (ref 0.0–0.2)

## 2022-07-29 LAB — URINALYSIS, ROUTINE W REFLEX MICROSCOPIC
Bilirubin Urine: NEGATIVE
Glucose, UA: NEGATIVE mg/dL
Ketones, ur: 5 mg/dL — AB
Nitrite: POSITIVE — AB
Protein, ur: 100 mg/dL — AB
Specific Gravity, Urine: 1.014 (ref 1.005–1.030)
WBC, UA: >50 WBC/hpf (ref 0–5)
pH: 7 (ref 5.0–8.0)

## 2022-07-29 LAB — URINALYSIS, W/ REFLEX TO CULTURE (INFECTION SUSPECTED)
Bilirubin Urine: NEGATIVE
Glucose, UA: NEGATIVE mg/dL
Ketones, ur: NEGATIVE mg/dL
Nitrite: NEGATIVE
Protein, ur: 100 mg/dL — AB
RBC / HPF: >50 RBC/hpf (ref 0–5)
Specific Gravity, Urine: 1.013 (ref 1.005–1.030)
WBC, UA: >50 WBC/hpf (ref 0–5)
pH: 6 (ref 5.0–8.0)

## 2022-07-29 MED ORDER — PREDNISONE 10 MG PO TABS
10.0000 mg | ORAL_TABLET | Freq: Every day | ORAL | Status: DC
  Administered 2022-07-30: 10 mg via ORAL
  Filled 2022-07-29: qty 1

## 2022-07-29 MED ORDER — ACETAMINOPHEN ER 650 MG PO TBCR: 650.0000 mg | EXTENDED_RELEASE_TABLET | Freq: Four times a day (QID) | ORAL | Status: DC

## 2022-07-29 MED ORDER — CEPHALEXIN 500 MG PO CAPS
500.0000 mg | ORAL_CAPSULE | Freq: Once | ORAL | Status: AC
  Administered 2022-07-29: 500 mg via ORAL
  Filled 2022-07-29: qty 1

## 2022-07-29 MED ORDER — GABAPENTIN 100 MG PO CAPS
200.0000 mg | ORAL_CAPSULE | Freq: Every day | ORAL | Status: DC
  Administered 2022-07-30: 200 mg via ORAL
  Filled 2022-07-29: qty 2

## 2022-07-29 MED ORDER — EZETIMIBE 10 MG PO TABS
10.0000 mg | ORAL_TABLET | Freq: Every day | ORAL | Status: DC
  Administered 2022-07-30: 10 mg via ORAL
  Filled 2022-07-29: qty 1

## 2022-07-29 MED ORDER — FOLIC ACID 1 MG PO TABS
1.0000 mg | ORAL_TABLET | Freq: Every day | ORAL | Status: DC
  Administered 2022-07-30: 1 mg via ORAL
  Filled 2022-07-29: qty 1

## 2022-07-29 MED ORDER — GABAPENTIN 100 MG PO CAPS
100.0000 mg | ORAL_CAPSULE | Freq: Every day | ORAL | Status: DC
  Administered 2022-07-30: 100 mg via ORAL
  Filled 2022-07-29: qty 1

## 2022-07-29 MED ORDER — POTASSIUM CHLORIDE CRYS ER 20 MEQ PO TBCR
40.0000 meq | EXTENDED_RELEASE_TABLET | Freq: Once | ORAL | Status: AC
  Administered 2022-07-29: 40 meq via ORAL
  Filled 2022-07-29: qty 2

## 2022-07-29 MED ORDER — SODIUM CHLORIDE 0.9 % IV BOLUS
500.0000 mL | Freq: Once | INTRAVENOUS | Status: AC
  Administered 2022-07-29: 500 mL via INTRAVENOUS

## 2022-07-29 MED ORDER — PREDNISOLONE ACETATE 1 % OP SUSP: 1.0000 [drp] | Freq: Three times a day (TID) | OPHTHALMIC | Status: DC

## 2022-07-29 MED ORDER — SODIUM CHLORIDE 0.9 % IV SOLN
1.0000 g | Freq: Once | INTRAVENOUS | Status: AC
  Administered 2022-07-29: 1 g via INTRAVENOUS
  Filled 2022-07-29: qty 10

## 2022-07-29 MED ORDER — GLUCERNA SHAKE PO LIQD
237.0000 mL | Freq: Three times a day (TID) | ORAL | Status: DC
  Administered 2022-07-30: 237 mL via ORAL
  Filled 2022-07-29 (×8): qty 237

## 2022-07-29 MED ORDER — METHOTREXATE SODIUM 2.5 MG PO TABS: 12.5000 mg | ORAL_TABLET | ORAL | Status: DC

## 2022-07-29 MED ORDER — FUROSEMIDE 40 MG PO TABS
20.0000 mg | ORAL_TABLET | Freq: Every day | ORAL | Status: DC
  Administered 2022-07-30: 20 mg via ORAL
  Filled 2022-07-29: qty 1

## 2022-07-29 MED ORDER — CEPHALEXIN 500 MG PO CAPS
500.0000 mg | ORAL_CAPSULE | Freq: Two times a day (BID) | ORAL | Status: DC
  Administered 2022-07-30 (×2): 500 mg via ORAL
  Filled 2022-07-29 (×2): qty 1

## 2022-07-29 MED ORDER — APIXABAN 5 MG PO TABS
5.0000 mg | ORAL_TABLET | Freq: Two times a day (BID) | ORAL | Status: DC
  Administered 2022-07-30 (×2): 5 mg via ORAL
  Filled 2022-07-29 (×2): qty 1

## 2022-07-29 MED ORDER — MAGNESIUM OXIDE -MG SUPPLEMENT 400 (240 MG) MG PO TABS
400.0000 mg | ORAL_TABLET | Freq: Two times a day (BID) | ORAL | Status: DC
  Administered 2022-07-30 (×2): 400 mg via ORAL
  Filled 2022-07-29 (×2): qty 1

## 2022-07-29 MED ORDER — CEPHALEXIN 500 MG PO CAPS: 500.0000 mg | ORAL_CAPSULE | Freq: Two times a day (BID) | ORAL | 0 refills | Status: AC

## 2022-07-29 MED ORDER — LACTATED RINGERS IV BOLUS
500.0000 mL | Freq: Once | INTRAVENOUS | Status: AC
  Administered 2022-07-29: 500 mL via INTRAVENOUS

## 2022-07-29 NOTE — Telephone Encounter (Signed)
[facility] called in on pt behalf  Verbal orders for would care on bilateral extremities   2 week 9  2 prn   Call back # [phone]

## 2022-07-29 NOTE — ED Notes (Signed)
Patient transported to CT 

## 2022-07-29 NOTE — Discharge Instructions (Addendum)
Continue Keflex.  It is important that you drink and eat healthy foods.   Home health services have been ordered and should be contacting you within the next 48 hours  Thank you for allowing [place] to treat you in the emergency department today.  After reviewing your examination and potential testing that was done it appears that you are safe to go home.  I would like for you to follow-up with your doctor within the next several days, have them obtain your results and follow-up with them to review all of these tests.  If you should develop severe or worsening symptoms return to the emergency department immediately

## 2022-07-29 NOTE — ED Provider Notes (Signed)
[facility] EMERGENCY DEPARTMENT AT [facility] Provider Note   CSN: [id number] Arrival date & time: 07/29/22  0744     History  Chief Complaint  Patient presents with   [name] [name]    [name] [name] is a 89 y.o. female.  HPI 89 year old female with multiple comorbidities including hypertension, hyperlipidemia, DVT on Eliquis, diastolic dysfunction, and multiple other comorbidities presents with a slipped out of bed.  History is provided by the patient.  She was brought in by EMS that she slipped out of her bed at some point a few hours ago.  Patient states she did not fall or injure herself though her left arm was caught and started to feel numb due to the position was in.  When she was relieved that this position her arm got better.  She has no current pain.  She denies any headaches, cough, etc.  When asked, she states she has been feeling weak/dizzy, usually early in the morning that then goes away for the past few weeks.  She chronically is in a wheelchair.  Home Medications Prior to Admission medications   Medication Sig Start Date End Date Taking? Authorizing Provider  acetaminophen (TYLENOL) 650 MG CR tablet Take 650 mg by mouth every 6 (six) hours. 08/08/19  Yes [provider]  cephALEXin (KEFLEX) 500 MG capsule Take 1 capsule (500 mg total) by mouth 2 (two) times daily for 7 days. 07/29/22 08/05/22 Yes Pricilla Loveless, MD  cholecalciferol (VITAMIN D3) 25 MCG (1000 UNIT) tablet Take 1 tablet (1,000 Units total) by mouth daily. Patient taking differently: Take 1,000 Units by mouth daily. Monday Wednesday-Friday 04/16/21  Yes Pennington, Rushie Goltz, PA-C  ELIQUIS 5 MG TABS tablet TAKE 1 TABLET TWICE DAILY 11/25/21  Yes Doreatha Massed, MD  ezetimibe (ZETIA) 10 MG tablet TAKE 1 TABLET EVERY DAY 03/26/22  Yes Kerri Perches, MD  feeding supplement, GLUCERNA SHAKE, (GLUCERNA SHAKE) LIQD Take 237 mLs by mouth 3 (three) times daily between meals. 09/24/21  Yes Vassie Loll, MD   folic acid (FOLVITE) 1 MG tablet Take 1 tablet (1 mg total) by mouth daily. 10/09/21  Yes Sharee Holster, NP  furosemide (LASIX) 20 MG tablet Take 1 tablet (20 mg total) by mouth daily. 07/24/22  Yes Kerri Perches, MD  gabapentin (NEURONTIN) 100 MG capsule TAKE 1 CAPSULE EVERY MORNING AND TAKE 2 CAPSULES AT BEDTIME 07/20/22  Yes Kerri Perches, MD  magnesium oxide (MAG-OX) 400 (240 Mg) MG tablet TAKE 1 TABLET TWICE DAILY 11/27/21  Yes Doreatha Massed, MD  methotrexate 2.5 MG tablet Take 5 tablets (12.5 mg total) by mouth once a week. Caution:Chemotherapy. Protect from light. 12/25/21  Yes Glean Salvo, NP  prednisoLONE acetate (PRED FORTE) 1 % ophthalmic suspension Place 1 drop into the left eye 3 (three) times daily. 03/25/22  Yes [provider]  predniSONE (DELTASONE) 5 MG tablet Take 2 tablets (10 mg total) by mouth daily with breakfast. 12/25/21  Yes Glean Salvo, NP      Allergies    Cellcept [mycophenolate mofetil], Statins, Ciprofloxacin hcl, Hydrocodone, and Penicillins    Review of Systems   Review of Systems  Respiratory:  Negative for shortness of breath.   Cardiovascular:  Negative for chest pain.  Gastrointestinal:  Negative for abdominal pain.  Neurological:  Positive for dizziness and weakness. Negative for headaches.    Physical Exam Updated Vital Signs BP 122/88   Pulse 96   Temp 98.4 F (36.9 C) (Oral)  Resp (!) 23   SpO2 95%  Physical Exam Vitals and nursing note reviewed.  Constitutional:      General: She is not in acute distress.    Appearance: She is well-developed. She is not ill-appearing or diaphoretic.  HENT:     Head: Normocephalic and atraumatic.  Cardiovascular:     Rate and Rhythm: Normal rate and regular rhythm.     Pulses:          Radial pulses are 2+ on the left side.     Heart sounds: Murmur heard.  Pulmonary:     Effort: Pulmonary effort is normal.     Breath sounds: Normal breath sounds. No wheezing.  Abdominal:      Palpations: Abdomen is soft.     Tenderness: There is no abdominal tenderness.  Musculoskeletal:     Cervical back: No rigidity.     Comments: Both leg are wrapped in Unna boots.  When these were removed no signs of cellulitis or significant infected wound on either leg.  Skin:    General: Skin is warm and dry.  Neurological:     Mental Status: She is alert and oriented to person, place, and time.     Comments: Awake, alert, oriented to person, place, time, situation.  She has normal strength and sensation in both upper extremities.  Both lower extremities she can flex her ankles but cannot lift her legs off the stretcher, baseline for her.     ED Results / Procedures / Treatments   Labs (all labs ordered are listed, but only abnormal results are displayed) Labs Reviewed  URINALYSIS, W/ REFLEX TO CULTURE (INFECTION SUSPECTED) - Abnormal; Notable for the following components:      Result Value   Color, Urine AMBER (*)    APPearance CLOUDY (*)    Hgb urine dipstick LARGE (*)    Protein, ur 100 (*)    Leukocytes,Ua SMALL (*)    Bacteria, UA MANY (*)    All other components within normal limits  COMPREHENSIVE METABOLIC PANEL - Abnormal; Notable for the following components:   Potassium 3.1 (*)    Glucose, Bld 137 (*)    Creatinine, Ser 1.02 (*)    Calcium 8.5 (*)    Total Protein 6.1 (*)    Albumin 3.0 (*)    Total Bilirubin 1.4 (*)    GFR, Estimated 53 (*)    All other components within normal limits  CBC WITH DIFFERENTIAL/PLATELET - Abnormal; Notable for the following components:   WBC 14.7 (*)    RBC 3.65 (*)    Hemoglobin 11.0 (*)    HCT 35.3 (*)    RDW 16.2 (*)    Neutro Abs 13.1 (*)    Lymphs Abs 0.3 (*)    Abs Immature Granulocytes 0.15 (*)    All other components within normal limits  URINE CULTURE    EKG EKG Interpretation  Date/Time:  Wednesday Jul 29 2022 07:54:56 EDT Ventricular Rate:  91 PR Interval:  159 QRS Duration: 96 QT Interval:  390 QTC  Calculation: 480 R Axis:   -3 Text Interpretation: Sinus rhythm Abnormal R-wave progression, early transition Left ventricular hypertrophy  no significant change since July 2023 Confirmed by Pricilla Loveless [phone]) on 07/29/2022 8:07:33 AM  Radiology DG Chest Portable 1 View  Result Date: 07/29/2022 CLINICAL DATA:  Weakness EXAM: PORTABLE CHEST 1 VIEW COMPARISON:  CXR 09/20/21 FINDINGS: Low lung volumes. No pleural effusion. No pneumothorax. Left basilar atelectasis. Normal cardiac and mediastinal  contours. No radiographically apparent displaced rib fractures. Visualized upper abdomen is unremarkable. IMPRESSION: Low lung volumes with left basilar atelectasis. Electronically Signed   By: Lorenza Cambridge M.D.   On: 07/29/2022 09:12    Procedures Procedures    Medications Ordered in ED Medications  lactated ringers bolus 500 mL ( Intravenous Stopped 07/29/22 0927)  potassium chloride SA (KLOR-CON M) CR tablet 40 mEq (40 mEq Oral Given 07/29/22 0905)  cephALEXin (KEFLEX) capsule 500 mg (500 mg Oral Given 07/29/22 1342)    ED Course/ Medical Decision Making/ A&P                             Medical Decision Making Amount and/or Complexity of Data Reviewed Labs: ordered.    Details: Leukocytosis.  Mild hypokalemia.  UA consistent with UTI. Radiology: ordered and independent interpretation performed.    Details: No pneumonia ECG/medicine tests: ordered and independent interpretation performed.    Details: No ischemia  Risk Prescription drug management.   Patient slipped out of bed but is otherwise well-appearing.  No focal neurodeficits.  Workup shows likely UTI and while she is not symptomatic, given the generalized weakness and the leukocytosis I think treating is reasonable.  Otherwise, given a small bolus of fluids for a slight bump in her creatinine.  Sister is now here and states she is not confused, unclear why EMS had reported some confusion at home.  She never hit her head or appeared  altered so I do not think CT head needed.  Otherwise, the patient's Unna boots were unwrapped and there is no signs of a cellulitis.  Overall seems stable for discharge home with supportive care and antibiotics for UTI.        Final Clinical Impression(s) / ED Diagnoses Final diagnoses:  Acute urinary tract infection    Rx / DC Orders ED Discharge Orders          Ordered    cephALEXin (KEFLEX) 500 MG capsule  2 times daily        07/29/22 1327              Pricilla Loveless, MD 07/29/22 1521

## 2022-07-29 NOTE — Discharge Instructions (Addendum)
Your workup today shows that you have an acute urinary tract infection.  This may be causing some your weakness.  There is also some dehydration.  You are being given antibiotics and you should follow-up with your primary care physician.  If you develop new or worsening weakness, vomiting, fever, pain, or any other new/concerning symptoms then return to the ER or call 911.

## 2022-07-29 NOTE — ED Triage Notes (Signed)
Pt was discharged from facility earlier today with uti and family states she is not any better and wants her to be re evaluated.

## 2022-07-29 NOTE — Telephone Encounter (Signed)
Verbal order given  

## 2022-07-29 NOTE — ED Provider Notes (Addendum)
[place] EMERGENCY DEPARTMENT AT [facility] Provider Note   CSN: [id number] Arrival date & time: 07/29/22  1918     History  Chief Complaint  Patient presents with   Fatigue    [name] [name] is a 89 y.o. female.  Pt is a 89 yo female with pmhx significant for htn, hld, uterine cancer, oa, myopathy, peripheral neuropathy, and DVT on Eliquis.  Pt was here earlier today because of weakness.  The pt was diagnosed with a UTI and was d/c home.  Family called EMS tonight to bring her back because they think she is worse.  She is wheelchair bound, but can normally sit up straight and help with transfer.  She was unable to do that today.  She has not eaten or drank much today.  Pt denies any pain.       Home Medications Prior to Admission medications   Medication Sig Start Date End Date Taking? Authorizing Provider  acetaminophen (TYLENOL) 650 MG CR tablet Take 650 mg by mouth every 6 (six) hours. 08/08/19   [provider]  cephALEXin (KEFLEX) 500 MG capsule Take 1 capsule (500 mg total) by mouth 2 (two) times daily for 7 days. 07/29/22 08/05/22  Pricilla Loveless, MD  cholecalciferol (VITAMIN D3) 25 MCG (1000 UNIT) tablet Take 1 tablet (1,000 Units total) by mouth daily. Patient taking differently: Take 1,000 Units by mouth daily. Monday Wednesday-Friday 04/16/21   Carnella Guadalajara, PA-C  ELIQUIS 5 MG TABS tablet TAKE 1 TABLET TWICE DAILY 11/25/21   Doreatha Massed, MD  ezetimibe (ZETIA) 10 MG tablet TAKE 1 TABLET EVERY DAY 03/26/22   Kerri Perches, MD  feeding supplement, GLUCERNA SHAKE, (GLUCERNA SHAKE) LIQD Take 237 mLs by mouth 3 (three) times daily between meals. 09/24/21   Vassie Loll, MD  folic acid (FOLVITE) 1 MG tablet Take 1 tablet (1 mg total) by mouth daily. 10/09/21   Sharee Holster, NP  furosemide (LASIX) 20 MG tablet Take 1 tablet (20 mg total) by mouth daily. 07/24/22   Kerri Perches, MD  gabapentin (NEURONTIN) 100 MG capsule TAKE 1 CAPSULE  EVERY MORNING AND TAKE 2 CAPSULES AT BEDTIME 07/20/22   Kerri Perches, MD  magnesium oxide (MAG-OX) 400 (240 Mg) MG tablet TAKE 1 TABLET TWICE DAILY 11/27/21   Doreatha Massed, MD  methotrexate 2.5 MG tablet Take 5 tablets (12.5 mg total) by mouth once a week. Caution:Chemotherapy. Protect from light. 12/25/21   Glean Salvo, NP  prednisoLONE acetate (PRED FORTE) 1 % ophthalmic suspension Place 1 drop into the left eye 3 (three) times daily. 03/25/22   [provider]  predniSONE (DELTASONE) 5 MG tablet Take 2 tablets (10 mg total) by mouth daily with breakfast. 12/25/21   Glean Salvo, NP      Allergies    Cellcept [mycophenolate mofetil], Statins, Ciprofloxacin hcl, Hydrocodone, and Penicillins    Review of Systems   Review of Systems  Neurological:  Positive for weakness.  All other systems reviewed and are negative.   Physical Exam Updated Vital Signs BP (!) 124/59   Pulse 89   Temp 98.7 F (37.1 C) (Oral)   Resp (!) 28   Ht 5\' 6"  (1.676 m)   Wt 73.9 kg   SpO2 98%   BMI 26.30 kg/m  Physical Exam Vitals and nursing note reviewed.  Constitutional:      Appearance: Normal appearance. She is obese.  HENT:     Head: Normocephalic and atraumatic.  Right Ear: External ear normal.     Left Ear: External ear normal.     Nose: Nose normal.     Mouth/Throat:     Mouth: Mucous membranes are dry.  Eyes:     Extraocular Movements: Extraocular movements intact.     Conjunctiva/sclera: Conjunctivae normal.     Pupils: Pupils are equal, round, and reactive to light.  Cardiovascular:     Rate and Rhythm: Normal rate and regular rhythm.     Pulses: Normal pulses.     Heart sounds: Normal heart sounds.  Pulmonary:     Effort: Pulmonary effort is normal.     Breath sounds: Normal breath sounds.  Abdominal:     General: Abdomen is flat. Bowel sounds are normal.     Palpations: Abdomen is soft.  Musculoskeletal:        General: Normal range of motion.      Cervical back: Normal range of motion and neck supple.  Skin:    General: Skin is warm.     Capillary Refill: Capillary refill takes less than 2 seconds.  Neurological:     General: No focal deficit present.     Mental Status: She is alert and oriented to person, place, and time.  Psychiatric:        Mood and Affect: Mood normal.        Behavior: Behavior normal.     ED Results / Procedures / Treatments   Labs (all labs ordered are listed, but only abnormal results are displayed) Labs Reviewed  CBC WITH DIFFERENTIAL/PLATELET - Abnormal; Notable for the following components:      Result Value   WBC 14.3 (*)    RBC 3.57 (*)    Hemoglobin 10.7 (*)    HCT 34.4 (*)    RDW 16.1 (*)    Neutro Abs 12.9 (*)    Lymphs Abs 0.2 (*)    Monocytes Absolute 1.1 (*)    Abs Immature Granulocytes 0.10 (*)    All other components within normal limits  COMPREHENSIVE METABOLIC PANEL - Abnormal; Notable for the following components:   Glucose, Bld 124 (*)    BUN 25 (*)    Creatinine, Ser 1.02 (*)    Calcium 8.4 (*)    Total Protein 6.0 (*)    Albumin 3.0 (*)    GFR, Estimated 53 (*)    All other components within normal limits  URINALYSIS, ROUTINE W REFLEX MICROSCOPIC - Abnormal; Notable for the following components:   APPearance HAZY (*)    Hgb urine dipstick MODERATE (*)    Ketones, ur 5 (*)    Protein, ur 100 (*)    Nitrite POSITIVE (*)    Leukocytes,Ua MODERATE (*)    Bacteria, UA RARE (*)    All other components within normal limits  CBG MONITORING, ED    EKG EKG Interpretation  Date/Time:  Wednesday Jul 29 2022 20:16:04 EDT Ventricular Rate:  98 PR Interval:  153 QRS Duration: 96 QT Interval:  369 QTC Calculation: 472 R Axis:   24 Text Interpretation: Sinus rhythm Posterior infarct, old No significant change since last tracing Confirmed by Jacalyn Lefevre [phone]) on 07/29/2022 8:55:01 PM  Radiology CT Head Wo Contrast  Result Date: 07/29/2022 CLINICAL DATA:  Altered  mental status EXAM: CT HEAD WITHOUT CONTRAST TECHNIQUE: Contiguous axial images were obtained from the base of the skull through the vertex without intravenous contrast. RADIATION DOSE REDUCTION: This exam was performed according to the departmental dose-optimization program  which includes automated exposure control, adjustment of the mA and/or kV according to patient size and/or use of iterative reconstruction technique. COMPARISON:  09/20/2021 FINDINGS: Brain: No evidence of acute infarction, hemorrhage, hydrocephalus, extra-axial collection or mass lesion/mass effect. Chronic atrophic and ischemic changes are noted. Vascular: No hyperdense vessel or unexpected calcification. Skull: Normal. Negative for fracture or focal lesion. Sinuses/Orbits: No acute finding. Other: None. IMPRESSION: Chronic atrophic and ischemic changes without acute abnormality. Electronically Signed   By: Alcide Clever M.D.   On: 07/29/2022 20:37   DG Chest Portable 1 View  Result Date: 07/29/2022 CLINICAL DATA:  Weakness EXAM: PORTABLE CHEST 1 VIEW COMPARISON:  CXR 09/20/21 FINDINGS: Low lung volumes. No pleural effusion. No pneumothorax. Left basilar atelectasis. Normal cardiac and mediastinal contours. No radiographically apparent displaced rib fractures. Visualized upper abdomen is unremarkable. IMPRESSION: Low lung volumes with left basilar atelectasis. Electronically Signed   By: Lorenza Cambridge M.D.   On: 07/29/2022 09:12    Procedures Procedures    Medications Ordered in ED Medications  cefTRIAXone (ROCEPHIN) 1 g in sodium chloride 0.9 % 100 mL IVPB (1 g Intravenous New Bag/Given 07/29/22 2230)  sodium chloride 0.9 % bolus 500 mL (0 mLs Intravenous Stopped 07/29/22 2102)    ED Course/ Medical Decision Making/ A&P                             Medical Decision Making Amount and/or Complexity of Data Reviewed Labs: ordered. Radiology: ordered.   This patient presents to the ED for concern of weakness, this involves an  extensive number of treatment options, and is a complaint that carries with it a high risk of complications and morbidity.  The differential diagnosis includes infection, electrolyte abn   Co morbidities that complicate the patient evaluation  htn, hld, uterine cancer, oa, myopathy, peripheral neuropathy, and DVT on Eliquis   Additional history obtained:  Additional history obtained from epic chart review External records from outside source obtained and reviewed including family   Lab Tests:  I Ordered, and personally interpreted labs.  The pertinent results include:  cbc with wbc elevated at 14.3, cmp with mild elevation in bun at 25 and cr 1.02; ua + uti   Imaging Studies ordered:  I ordered imaging studies including ct head  I independently visualized and interpreted imaging which showed Chronic atrophic and ischemic changes without acute abnormality.  I agree with the radiologist interpretation   Cardiac Monitoring:  The patient was maintained on a cardiac monitor.  I personally viewed and interpreted the cardiac monitored which showed an underlying rhythm of: nsr   Medicines ordered and prescription drug management:  I ordered medication including rocephin  for uti  Reevaluation of the patient after these medicines showed that the patient improved I have reviewed the patients home medicines and have made adjustments as needed   Test Considered:  ct   Critical Interventions:  abx   Problem List / ED Course:  Dehydration:  pt given a 500 cc bolus.  She is feeling better.  She was able to eat/drink. UTI:  pt given rocephin in ED.  Pt looks and feels better. Family comfortable taking her home.  She is stable for d/c.  Return if worse.  F/u with pcp. SNF placement:  the family member in the room was ok taking patient home.  However, other family members request placement.  So, I will put her as a boarder status and have  sw/pt see her in the  am.   Reevaluation:  After the interventions noted above, I reevaluated the patient and found that they have :improved   Social Determinants of Health:  Lives at home   Dispostion:  After consideration of the diagnostic results and the patients response to treatment, I feel that the patent would benefit from discharge with outpatient f/u.          Final Clinical Impression(s) / ED Diagnoses Final diagnoses:  Dehydration  Acute cystitis without hematuria    Rx / DC Orders ED Discharge Orders     None         Jacalyn Lefevre, MD 07/29/22 2244    Jacalyn Lefevre, MD 07/29/22 2306

## 2022-07-29 NOTE — ED Triage Notes (Signed)
Pt BIB [facility] for c/o slid out of bed; pt states she tried to get up out of bed and slid down to floor, she states her left arm got caught in the guardrail and she had numbness in the arm until she got to the hospital; pt state the numbness is gone and she has no complaints of pain  Ems reports pt was covered in feces when they arrived but her home health aid helped clean her up  Family reports pt has been more confused the last few days and wonder if she may have another UTI  Pt is a & o x 4 here  Pt is on eliquis

## 2022-07-30 DIAGNOSIS — I119 Hypertensive heart disease without heart failure: Secondary | ICD-10-CM | POA: Diagnosis not present

## 2022-07-30 DIAGNOSIS — M48061 Spinal stenosis, lumbar region without neurogenic claudication: Secondary | ICD-10-CM | POA: Diagnosis not present

## 2022-07-30 DIAGNOSIS — I872 Venous insufficiency (chronic) (peripheral): Secondary | ICD-10-CM | POA: Diagnosis not present

## 2022-07-30 DIAGNOSIS — L97221 Non-pressure chronic ulcer of left calf limited to breakdown of skin: Secondary | ICD-10-CM | POA: Diagnosis not present

## 2022-07-30 DIAGNOSIS — L89612 Pressure ulcer of right heel, stage 2: Secondary | ICD-10-CM | POA: Diagnosis not present

## 2022-07-30 DIAGNOSIS — I890 Lymphedema, not elsewhere classified: Secondary | ICD-10-CM | POA: Diagnosis not present

## 2022-07-30 DIAGNOSIS — I080 Rheumatic disorders of both mitral and aortic valves: Secondary | ICD-10-CM | POA: Diagnosis not present

## 2022-07-30 DIAGNOSIS — I739 Peripheral vascular disease, unspecified: Secondary | ICD-10-CM | POA: Diagnosis not present

## 2022-07-30 DIAGNOSIS — M1611 Unilateral primary osteoarthritis, right hip: Secondary | ICD-10-CM | POA: Diagnosis not present

## 2022-07-30 MED ORDER — ACETAMINOPHEN 325 MG PO TABS
650.0000 mg | ORAL_TABLET | Freq: Four times a day (QID) | ORAL | Status: DC
  Administered 2022-07-30 (×2): 650 mg via ORAL
  Filled 2022-07-30 (×2): qty 2

## 2022-07-30 NOTE — ED Provider Notes (Signed)
Emergency Medicine Observation Re-evaluation Note  [name] [name] is a 89 y.o. female, seen on rounds today.  Pt initially presented to the ED for complaints of Fatigue Currently, the patient is here with fatigue, vital signs unremarkable, minimal hypertension.  Physical Exam  BP (!) 148/69   Pulse 88   Temp 98.7 F (37.1 C) (Oral)   Resp 17   Ht 1.676 m (5\' 6" )   Wt 73.9 kg   SpO2 98%   BMI 26.30 kg/m  Physical Exam General: No distress, awake Cardiac: No tachycardia Lungs: Clear lungs Psych: Mental status normal, awake and alert  ED Course / MDM  EKG:EKG Interpretation  Date/Time:  Wednesday Jul 29 2022 20:16:04 EDT Ventricular Rate:  98 PR Interval:  153 QRS Duration: 96 QT Interval:  369 QTC Calculation: 472 R Axis:   24 Text Interpretation: Sinus rhythm Posterior infarct, old No significant change since last tracing Confirmed by Jacalyn Lefevre [phone]) on 07/29/2022 8:55:01 PM  I have reviewed the labs performed to date as well as medications administered while in observation.  Recent changes in the last 24 hours include maintain normal vital signs, family request patient be discharged with home health, this has been ordered.  Plan  Current plan is for discharge with home health after discussion with social worker Elliot Gault.    Eber Hong, MD 07/30/22 1122

## 2022-07-30 NOTE — Evaluation (Signed)
Physical Therapy Evaluation Patient Details Name: [name] [name] MRN: [id number] DOB: [birth year] Today's Date: 07/30/2022  History of Present Illness  [name] [name] is a 89 y.o. female.     HPI  89 year old female with multiple comorbidities including hypertension, hyperlipidemia, DVT on Eliquis, diastolic dysfunction, and multiple other comorbidities presents with a slipped out of bed.  History is provided by the patient.  She was brought in by EMS that she slipped out of her bed at some point a few hours ago.  Patient states she did not fall or injure herself though her left arm was caught and started to feel numb due to the position was in.  When she was relieved that this position her arm got better.  She has no current pain.  She denies any headaches, cough, etc.  When asked, she states she has been feeling weak/dizzy, usually early in the morning that then goes away for the past few weeks.  She chronically is in a wheelchair.   Clinical Impression  Patient demonstrates labored movement for sitting at bedside due to generalized weakness, good return for maintaining sitting balance while seated at EOB, able to stand and take a couple of side steps using RW, but has difficulty scooting laterally due to generalized weakness.  PLAN:  Patient to be discharged home today and discharged from acute physical therapy to care of nursing for out of bed as tolerated for length of stay with recommendations stated below        Recommendations for follow up therapy are one component of a multi-disciplinary discharge planning process, led by the attending physician.  Recommendations may be updated based on patient status, additional functional criteria and insurance authorization.  Follow Up Recommendations       Assistance Recommended at Discharge Set up Supervision/Assistance  Patient can return home with the following  A lot of help with walking and/or transfers;A lot of help with  bathing/dressing/bathroom;Assistance with cooking/housework;Assist for transportation;Help with stairs or ramp for entrance    Equipment Recommendations None recommended by PT  Recommendations for Other Services       Functional Status Assessment Patient has had a recent decline in their functional status and demonstrates the ability to make significant improvements in function in a reasonable and predictable amount of time.     Precautions / Restrictions Precautions Precautions: Fall Restrictions Weight Bearing Restrictions: No      Mobility  Bed Mobility Overal bed mobility: Needs Assistance Bed Mobility: Supine to Sit, Sit to Supine     Supine to sit: Min assist Sit to supine: Min assist        Transfers Overall transfer level: Needs assistance Equipment used: Rolling walker (2 wheels) Transfers: Sit to/from Stand Sit to Stand: Mod assist                Ambulation/Gait Ambulation/Gait assistance: Mod assist Gait Distance (Feet): 2 Feet Assistive device: Rolling walker (2 wheels) Gait Pattern/deviations: Decreased step length - right, Decreased step length - left, Decreased stance time - right, Decreased stance time - left, Knees buckling Gait velocity: slow     General Gait Details: limited to a couple of slow labored side steps due to knees buckling/weakness  Stairs            Wheelchair Mobility    Modified Rankin (Stroke Patients Only)       Balance Overall balance assessment: Needs assistance Sitting-balance support: Feet supported, No upper extremity supported Sitting balance-Leahy Scale: Fair Sitting balance -  Comments: fair/good seated at EOB   Standing balance support: Reliant on assistive device for balance, During functional activity, Bilateral upper extremity supported Standing balance-Leahy Scale: Poor Standing balance comment: using RW                             Pertinent Vitals/Pain Pain Assessment Pain  Assessment: No/denies pain    Home Living Family/patient expects to be discharged to:: Private residence Living Arrangements: Alone Available Help at Discharge: Family;Available PRN/intermittently;Personal care attendant Type of Home: House Home Access: Stairs to enter Entrance Stairs-Rails: Doctor, general practice of Steps: 5   Home Layout: One level Home Equipment: Agricultural consultant (2 wheels);Grab bars - tub/shower;Shower seat;BSC/3in1;Wheelchair - manual      Prior Function Prior Level of Function : Needs assist       Physical Assist : ADLs (physical)     Mobility Comments: Modified independent for transfers scooting over from wheelchair to bed ADLs Comments: Patient needs assist with ADL's with help from home aides who come for a couple of hours a day M-F. Performs sliding transfers between surfaces     Hand Dominance   Dominant Hand: Right    Extremity/Trunk Assessment   Upper Extremity Assessment Upper Extremity Assessment: Generalized weakness    Lower Extremity Assessment Lower Extremity Assessment: Generalized weakness    Cervical / Trunk Assessment Cervical / Trunk Assessment: Normal  Communication   Communication: No difficulties  Cognition Arousal/Alertness: Awake/alert Behavior During Therapy: WFL for tasks assessed/performed Overall Cognitive Status: Within Functional Limits for tasks assessed                                          General Comments      Exercises     Assessment/Plan    PT Assessment All further PT needs can be met in the next venue of care  PT Problem List Decreased strength;Decreased activity tolerance;Decreased balance;Decreased mobility       PT Treatment Interventions      PT Goals (Current goals can be found in the Care Plan section)  Acute Rehab PT Goals Patient Stated Goal: return home with home aides and family to assist PT Goal Formulation: With patient Time For Goal Achievement:  07/30/22 Potential to Achieve Goals: Good    Frequency       Co-evaluation               AM-PAC PT "6 Clicks" Mobility  Outcome Measure Help needed turning from your back to your side while in a flat bed without using bedrails?: A Little Help needed moving from lying on your back to sitting on the side of a flat bed without using bedrails?: A Little Help needed moving to and from a bed to a chair (including a wheelchair)?: A Lot Help needed standing up from a chair using your arms (e.g., wheelchair or bedside chair)?: A Lot Help needed to walk in hospital room?: A Lot Help needed climbing 3-5 steps with a railing? : Total 6 Click Score: 13    End of Session   Activity Tolerance: Patient tolerated treatment well;Patient limited by fatigue Patient left: in bed Nurse Communication: Mobility status PT Visit Diagnosis: Unsteadiness on feet (R26.81);Other abnormalities of gait and mobility (R26.89);Muscle weakness (generalized) (M62.81)    Time: 4098-1191 PT Time Calculation (min) (ACUTE ONLY): 20 min   Charges:  PT Evaluation $PT Eval Moderate Complexity: 1 Mod PT Treatments $Therapeutic Activity: 8-22 mins        2:25 PM, 07/30/22 Ocie Bob, MPT Physical Therapist with [facility] [phone] office [phone] mobile phone

## 2022-07-30 NOTE — ED Notes (Signed)
Transition of Care [facility]) - Emergency Department Mini Assessment   Patient Details  Name: [name] [name] MRN: [id number] Date of Birth: 04/30/1932  Transition of [facility]) CM/SW Contact:    Elliot Gault, LCSW Phone Number: 07/30/2022, 11:19 AM   Clinical Narrative:  Jayme Cloud consult for SNF placement from the ED. PT evaluated pt and stated pt needs min assist for transfers.   Spoke with pt's son who reports that pt resides on her own. She has home aide services for a couple hours at bedtime and when she gets up. Son states that they are going to increase the number of hours for the aide. Pt has been active with [facility] for PT at home. Asked MD for [facility] PT/OT orders at son request.  Per son, they do not want SNF and will take pt home today.  Updated [facility] Custard at [place].  No other CSW needs.  ED Mini Assessment: What brought you to the Emergency Department? : fall  Barriers to Discharge: Barriers Resolved     Means of departure: Car  Interventions which prevented an admission or readmission: Home Health Consult or Services    Patient Contact and Communications        ,            CMS Medicare.gov Compare Post Acute Care list provided to:: Patient Represenative (must comment) Choice offered to / list presented to : Adult Children  Admission diagnosis:  Re-Evaluation Patient Active Problem List   Diagnosis Date Noted   Diastolic dysfunction 01/18/2022   Aortic stenosis 01/16/2022   Mitral valve regurgitation 01/16/2022   Encounter for Medicare annual examination with abnormal findings 12/07/2021   Breast lump on right side at 11 o'clock position 11/06/2021   Severe protein-calorie malnutrition (HCC) 09/25/2021   Aortic atherosclerosis (HCC) 09/25/2021   Rhabdomyolysis 09/20/2021   Recurrent falls 09/20/2021   Leukocytosis 09/20/2021   Chronic deep vein thrombosis (DVT) of distal vein of left lower extremity (HCC) 09/20/2021   Elevated troponin  09/20/2021   Osteoarthritis 09/20/2021   Paresthesia 05/05/2021   Gait abnormality 05/05/2021   Dermatitis 05/01/2021   Leg ulcer, left (HCC) 03/25/2021   HZV (herpes zoster virus) post herpetic neuralgia 05/31/2020   Zoster 04/30/2020   Hypomagnesemia 08/11/2019   Lower extremity edema 08/06/2019   Bilateral lower extremity edema 08/04/2019   Hypokalemia 08/04/2019   Hypoalbuminemia 08/04/2019   Venous stasis ulcers (HCC) 04/03/2019   Lower leg edema 03/22/2019   At high risk for falls per [name] fall risk assessment scale 02/09/2019   Neck pain, bilateral posterior 01/13/2019   Statins contraindicated 08/14/2018   Peripheral neuropathy 07/06/2018   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   Myopathy 02/15/2018   Lumbar spinal stenosis 01/14/2018   GERD (gastroesophageal reflux disease) 06/17/2016   Normocytic anemia    Sciatica of left side associated with disorder of lumbosacral spine 11/29/2014   Chronic constipation 10/04/2012   Urinary incontinence 06/20/2012   Vitamin D deficiency 08/22/2011   Prediabetes 05/29/2011   NASH (nonalcoholic steatohepatitis) 11/05/2009   Arthritis of right knee 09/04/2009   Hyperlipidemia LDL goal <100 09/08/2007   Essential hypertension 09/08/2007   PCP:  Kerri Perches, MD Pharmacy:   [facility] [address] [place] - [address] [place] [address] [place] [address] Phone: [phone] Fax: [phone]  [facility] - [place], [place] - [address] [place] [address] Phone: [phone] Fax: [phone]

## 2022-07-31 DIAGNOSIS — I890 Lymphedema, not elsewhere classified: Secondary | ICD-10-CM | POA: Diagnosis not present

## 2022-07-31 DIAGNOSIS — M1611 Unilateral primary osteoarthritis, right hip: Secondary | ICD-10-CM | POA: Diagnosis not present

## 2022-07-31 DIAGNOSIS — M48061 Spinal stenosis, lumbar region without neurogenic claudication: Secondary | ICD-10-CM | POA: Diagnosis not present

## 2022-07-31 DIAGNOSIS — I739 Peripheral vascular disease, unspecified: Secondary | ICD-10-CM | POA: Diagnosis not present

## 2022-07-31 DIAGNOSIS — I119 Hypertensive heart disease without heart failure: Secondary | ICD-10-CM | POA: Diagnosis not present

## 2022-07-31 DIAGNOSIS — I080 Rheumatic disorders of both mitral and aortic valves: Secondary | ICD-10-CM | POA: Diagnosis not present

## 2022-07-31 DIAGNOSIS — L97221 Non-pressure chronic ulcer of left calf limited to breakdown of skin: Secondary | ICD-10-CM | POA: Diagnosis not present

## 2022-07-31 DIAGNOSIS — L89612 Pressure ulcer of right heel, stage 2: Secondary | ICD-10-CM | POA: Diagnosis not present

## 2022-07-31 DIAGNOSIS — I872 Venous insufficiency (chronic) (peripheral): Secondary | ICD-10-CM | POA: Diagnosis not present

## 2022-07-31 LAB — URINE CULTURE: Culture: >=100000 — AB

## 2022-08-01 ENCOUNTER — Telehealth (HOSPITAL_BASED_OUTPATIENT_CLINIC_OR_DEPARTMENT_OTHER): Payer: Self-pay | Admitting: *Deleted

## 2022-08-01 NOTE — Telephone Encounter (Signed)
Post ED Visit - Positive Culture Follow-up  Culture report reviewed by antimicrobial stewardship pharmacist: [facility] []  Enzo Bi, Pharm.D. []  Celedonio Miyamoto, Pharm.D., BCPS AQ-ID []  Garvin Fila, Pharm.D., BCPS []  Georgina Pillion, [address].D., BCPS []  [place], [place].D., BCPS, AAHIVP []  Estella Husk, Pharm.D., BCPS, AAHIVP []  Lysle Pearl, PharmD, BCPS []  Phillips Climes, PharmD, BCPS []  Agapito Games, PharmD, BCPS []  Verlan Friends, PharmD []  Mervyn Gay, PharmD, BCPS [x]  Delmar Landau, PharmD  [facility] []  Len Childs, PharmD []  Greer Pickerel, PharmD []  Adalberto Cole, PharmD []  Perlie Gold, Rph []  Lonell Face) Jean Rosenthal, PharmD []  Earl Many, PharmD []  Junita Push, PharmD []  Dorna Leitz, PharmD []  Terrilee Files, PharmD []  Lynann Beaver, PharmD []  Keturah Barre, PharmD []  Loralee Pacas, PharmD []  Bernadene Person, PharmD   Positive urine culture Treated with Cephalexin, organism sensitive to the same and no further patient follow-up is required at this time.  Patsey Berthold 08/01/2022, 2:39 PM

## 2022-08-03 DIAGNOSIS — M1611 Unilateral primary osteoarthritis, right hip: Secondary | ICD-10-CM | POA: Diagnosis not present

## 2022-08-03 DIAGNOSIS — I119 Hypertensive heart disease without heart failure: Secondary | ICD-10-CM | POA: Diagnosis not present

## 2022-08-03 DIAGNOSIS — M48061 Spinal stenosis, lumbar region without neurogenic claudication: Secondary | ICD-10-CM | POA: Diagnosis not present

## 2022-08-03 DIAGNOSIS — L97221 Non-pressure chronic ulcer of left calf limited to breakdown of skin: Secondary | ICD-10-CM | POA: Diagnosis not present

## 2022-08-03 DIAGNOSIS — I890 Lymphedema, not elsewhere classified: Secondary | ICD-10-CM | POA: Diagnosis not present

## 2022-08-03 DIAGNOSIS — I080 Rheumatic disorders of both mitral and aortic valves: Secondary | ICD-10-CM | POA: Diagnosis not present

## 2022-08-03 DIAGNOSIS — I739 Peripheral vascular disease, unspecified: Secondary | ICD-10-CM | POA: Diagnosis not present

## 2022-08-03 DIAGNOSIS — L89612 Pressure ulcer of right heel, stage 2: Secondary | ICD-10-CM | POA: Diagnosis not present

## 2022-08-03 DIAGNOSIS — I872 Venous insufficiency (chronic) (peripheral): Secondary | ICD-10-CM | POA: Diagnosis not present

## 2022-08-05 ENCOUNTER — Telehealth: Payer: Self-pay | Admitting: *Deleted

## 2022-08-05 NOTE — Transitions of Care (Post Inpatient/ED Visit) (Signed)
08/05/2022  Name: [name] [name] MRN: [id number] DOB: March 21, 1933  Today's TOC FU Call Status: Today's TOC FU Call Status:: Successful TOC FU Call Competed TOC FU Call Complete Date: 08/05/22  Transition Care Management Follow-up Telephone Call Date of Discharge: 07/30/22 [facility] Type of Discharge: Emergency Department Reason for ED Visit: Other: (UTI) How have you been since you were released from the hospital?: Better Any questions or concerns?: No  Items Reviewed: Did you receive and understand the discharge instructions provided?: Yes Any new allergies since your discharge?: No Dietary orders reviewed?: No Do you have support at home?: Yes People in Home: alone Name of Support/Comfort Primary Source: [name] son and caregivers  Medications Reviewed Today: Medications Reviewed Today     Reviewed by Luella Cook, RN (Case Manager) on 08/05/22 at 1236  Med List Status: <None>   Medication Order Taking? Sig Documenting Provider Last Dose Status Informant  acetaminophen (TYLENOL) 650 MG CR tablet [id number] Yes Take 650 mg by mouth as needed for pain. [provider] Taking Active Self, Pharmacy Records, Family Member  cephALEXin (KEFLEX) 500 MG capsule [id number] Yes Take 1 capsule (500 mg total) by mouth 2 (two) times daily for 7 days. Pricilla Loveless, MD Taking Active   cholecalciferol (VITAMIN D3) 25 MCG (1000 UNIT) tablet [id number] Yes Take 1 tablet (1,000 Units total) by mouth daily.  Patient taking differently: Take 1,000 Units by mouth daily. Monday Wednesday-Friday   Carnella Guadalajara, PA-C Taking Active Self, Pharmacy Records, Family Member  [place] [address] [place] TABS tablet [id number] Yes TAKE 1 TABLET TWICE DAILY Doreatha Massed, MD Taking Active Family Member, Self, Pharmacy Records  ezetimibe (ZETIA) 10 MG tablet [id number] Yes TAKE 1 TABLET EVERY DAY Kerri Perches, MD Taking Active Family Member, Self, Pharmacy Records   feeding supplement, GLUCERNA Jesse Fall (GLUCERNA SHAKE) LIQD [id number] Yes Take 237 mLs by mouth 3 (three) times daily between meals. Vassie Loll, MD Taking Active Family Member, Self, Pharmacy Records  folic acid (FOLVITE) 1 MG tablet [id number] Yes Take 1 tablet (1 mg total) by mouth daily. Sharee Holster, NP Taking Active Family Member, Self, Pharmacy Records  furosemide (LASIX) 20 MG tablet [id number] No Take 1 tablet (20 mg total) by mouth daily.  Patient not taking: Reported on 07/30/2022   Kerri Perches, MD Not Taking Active            Med Note Richarda Blade, Bartholomew Crews Jul 29, 2022  9:53 AM) Pt ran out, waiting for refills   gabapentin (NEURONTIN) 100 MG capsule [id number] Yes TAKE 1 CAPSULE EVERY MORNING AND TAKE 2 CAPSULES AT BEDTIME Kerri Perches, MD Taking Active   magnesium oxide (MAG-OX) 400 (240 Mg) MG tablet [id number] Yes TAKE 1 TABLET TWICE DAILY Doreatha Massed, MD Taking Active Family Member, Self, Pharmacy Records  methotrexate 2.5 MG tablet [id number] Yes Take 5 tablets (12.5 mg total) by mouth once a week. Caution:Chemotherapy. Protect from light. Glean Salvo, NP Taking Active Family Member, Self, Pharmacy Records           Med Note Sharlene Dory   Tue Apr 07, 2022  1:47 PM) Wednesday   prednisoLONE acetate (PRED FORTE) 1 % ophthalmic suspension [id number] Yes Place 1 drop into the left eye 3 (three) times daily. [provider] Taking Active Family Member, Self, Pharmacy Records  predniSONE (DELTASONE) 5 MG tablet [id number] Yes Take 2 tablets (10 mg total) by mouth daily with breakfast. Glean Salvo,  NP Taking Active Family Member, Self, Pharmacy Records            Home Care and Equipment/Supplies: Were Home Health Services Ordered?: Yes [facility] of [facility] Has Agency set up a time to come to your home?: Yes First Home Health Visit Date: 08/03/22 Any new equipment or medical supplies ordered?: NA  Functional  Questionnaire: Do you need assistance with bathing/showering or dressing?: Yes Do you need assistance with meal preparation?: Yes Do you need assistance with eating?: No Do you have difficulty maintaining continence: No Do you need assistance with getting out of bed/getting out of a chair/moving?: Yes Do you have difficulty managing or taking your medications?: No  Follow up appointments reviewed: PCP Follow-up appointment confirmed?: No MD Provider Line [phone] Given: Yes (Patient wants to make her own appt because she has to make it around caregiver and transportation) Specialist Hospital Follow-up appointment confirmed?: NA Do you need transportation to your follow-up appointment?: No Do you understand care options if your condition(s) worsen?: Yes-patient verbalized understanding  SDOH Interventions Today    Flowsheet Row Most Recent Value  SDOH Interventions   Food Insecurity Interventions Intervention Not Indicated  Housing Interventions Intervention Not Indicated  Transportation Interventions Intervention Not Indicated      Interventions Today    Flowsheet Row Most Recent Value  General Interventions   General Interventions Discussed/Reviewed General Interventions Reviewed, General Interventions Discussed, Doctor Visits  Doctor Visits Discussed/Reviewed Doctor Visits Discussed, Doctor Visits Reviewed  Exercise Interventions   Exercise Discussed/Reviewed Exercise Discussed  [PT/OT is working with patient]  Pharmacy Interventions   Pharmacy Dicussed/Reviewed Pharmacy Topics Discussed, Pharmacy Topics Reviewed       TOC Interventions Today    Flowsheet Row Most Recent Value  TOC Interventions   TOC Interventions Discussed/Reviewed TOC Interventions Discussed, TOC Interventions Reviewed        Gean Maidens BSN RN [facility] [phone]

## 2022-08-06 DIAGNOSIS — I080 Rheumatic disorders of both mitral and aortic valves: Secondary | ICD-10-CM | POA: Diagnosis not present

## 2022-08-06 DIAGNOSIS — L97221 Non-pressure chronic ulcer of left calf limited to breakdown of skin: Secondary | ICD-10-CM | POA: Diagnosis not present

## 2022-08-06 DIAGNOSIS — I119 Hypertensive heart disease without heart failure: Secondary | ICD-10-CM | POA: Diagnosis not present

## 2022-08-06 DIAGNOSIS — M1611 Unilateral primary osteoarthritis, right hip: Secondary | ICD-10-CM | POA: Diagnosis not present

## 2022-08-06 DIAGNOSIS — M48061 Spinal stenosis, lumbar region without neurogenic claudication: Secondary | ICD-10-CM | POA: Diagnosis not present

## 2022-08-06 DIAGNOSIS — I890 Lymphedema, not elsewhere classified: Secondary | ICD-10-CM | POA: Diagnosis not present

## 2022-08-06 DIAGNOSIS — L89612 Pressure ulcer of right heel, stage 2: Secondary | ICD-10-CM | POA: Diagnosis not present

## 2022-08-06 DIAGNOSIS — I739 Peripheral vascular disease, unspecified: Secondary | ICD-10-CM | POA: Diagnosis not present

## 2022-08-06 DIAGNOSIS — I872 Venous insufficiency (chronic) (peripheral): Secondary | ICD-10-CM | POA: Diagnosis not present

## 2022-08-10 DIAGNOSIS — I119 Hypertensive heart disease without heart failure: Secondary | ICD-10-CM | POA: Diagnosis not present

## 2022-08-10 DIAGNOSIS — I080 Rheumatic disorders of both mitral and aortic valves: Secondary | ICD-10-CM | POA: Diagnosis not present

## 2022-08-10 DIAGNOSIS — I739 Peripheral vascular disease, unspecified: Secondary | ICD-10-CM | POA: Diagnosis not present

## 2022-08-10 DIAGNOSIS — I872 Venous insufficiency (chronic) (peripheral): Secondary | ICD-10-CM | POA: Diagnosis not present

## 2022-08-10 DIAGNOSIS — L89612 Pressure ulcer of right heel, stage 2: Secondary | ICD-10-CM | POA: Diagnosis not present

## 2022-08-10 DIAGNOSIS — M48061 Spinal stenosis, lumbar region without neurogenic claudication: Secondary | ICD-10-CM | POA: Diagnosis not present

## 2022-08-10 DIAGNOSIS — I890 Lymphedema, not elsewhere classified: Secondary | ICD-10-CM | POA: Diagnosis not present

## 2022-08-10 DIAGNOSIS — L97221 Non-pressure chronic ulcer of left calf limited to breakdown of skin: Secondary | ICD-10-CM | POA: Diagnosis not present

## 2022-08-10 DIAGNOSIS — M1611 Unilateral primary osteoarthritis, right hip: Secondary | ICD-10-CM | POA: Diagnosis not present

## 2022-08-13 ENCOUNTER — Telehealth: Payer: Medicare HMO | Admitting: Family Medicine

## 2022-08-13 DIAGNOSIS — I890 Lymphedema, not elsewhere classified: Secondary | ICD-10-CM | POA: Diagnosis not present

## 2022-08-13 DIAGNOSIS — L97221 Non-pressure chronic ulcer of left calf limited to breakdown of skin: Secondary | ICD-10-CM | POA: Diagnosis not present

## 2022-08-13 DIAGNOSIS — I872 Venous insufficiency (chronic) (peripheral): Secondary | ICD-10-CM | POA: Diagnosis not present

## 2022-08-13 DIAGNOSIS — I119 Hypertensive heart disease without heart failure: Secondary | ICD-10-CM | POA: Diagnosis not present

## 2022-08-13 DIAGNOSIS — M1611 Unilateral primary osteoarthritis, right hip: Secondary | ICD-10-CM | POA: Diagnosis not present

## 2022-08-13 DIAGNOSIS — I739 Peripheral vascular disease, unspecified: Secondary | ICD-10-CM | POA: Diagnosis not present

## 2022-08-13 DIAGNOSIS — I080 Rheumatic disorders of both mitral and aortic valves: Secondary | ICD-10-CM | POA: Diagnosis not present

## 2022-08-13 DIAGNOSIS — M48061 Spinal stenosis, lumbar region without neurogenic claudication: Secondary | ICD-10-CM | POA: Diagnosis not present

## 2022-08-13 DIAGNOSIS — L89612 Pressure ulcer of right heel, stage 2: Secondary | ICD-10-CM | POA: Diagnosis not present

## 2022-08-18 DIAGNOSIS — I872 Venous insufficiency (chronic) (peripheral): Secondary | ICD-10-CM | POA: Diagnosis not present

## 2022-08-18 DIAGNOSIS — M1611 Unilateral primary osteoarthritis, right hip: Secondary | ICD-10-CM | POA: Diagnosis not present

## 2022-08-18 DIAGNOSIS — I890 Lymphedema, not elsewhere classified: Secondary | ICD-10-CM | POA: Diagnosis not present

## 2022-08-18 DIAGNOSIS — I080 Rheumatic disorders of both mitral and aortic valves: Secondary | ICD-10-CM | POA: Diagnosis not present

## 2022-08-18 DIAGNOSIS — I739 Peripheral vascular disease, unspecified: Secondary | ICD-10-CM | POA: Diagnosis not present

## 2022-08-18 DIAGNOSIS — M48061 Spinal stenosis, lumbar region without neurogenic claudication: Secondary | ICD-10-CM | POA: Diagnosis not present

## 2022-08-18 DIAGNOSIS — L89612 Pressure ulcer of right heel, stage 2: Secondary | ICD-10-CM | POA: Diagnosis not present

## 2022-08-18 DIAGNOSIS — L97221 Non-pressure chronic ulcer of left calf limited to breakdown of skin: Secondary | ICD-10-CM | POA: Diagnosis not present

## 2022-08-18 DIAGNOSIS — I119 Hypertensive heart disease without heart failure: Secondary | ICD-10-CM | POA: Diagnosis not present

## 2022-08-20 DIAGNOSIS — I739 Peripheral vascular disease, unspecified: Secondary | ICD-10-CM | POA: Diagnosis not present

## 2022-08-20 DIAGNOSIS — I890 Lymphedema, not elsewhere classified: Secondary | ICD-10-CM | POA: Diagnosis not present

## 2022-08-20 DIAGNOSIS — M48061 Spinal stenosis, lumbar region without neurogenic claudication: Secondary | ICD-10-CM | POA: Diagnosis not present

## 2022-08-20 DIAGNOSIS — M1611 Unilateral primary osteoarthritis, right hip: Secondary | ICD-10-CM | POA: Diagnosis not present

## 2022-08-20 DIAGNOSIS — L97221 Non-pressure chronic ulcer of left calf limited to breakdown of skin: Secondary | ICD-10-CM | POA: Diagnosis not present

## 2022-08-20 DIAGNOSIS — L89612 Pressure ulcer of right heel, stage 2: Secondary | ICD-10-CM | POA: Diagnosis not present

## 2022-08-20 DIAGNOSIS — I872 Venous insufficiency (chronic) (peripheral): Secondary | ICD-10-CM | POA: Diagnosis not present

## 2022-08-20 DIAGNOSIS — I080 Rheumatic disorders of both mitral and aortic valves: Secondary | ICD-10-CM | POA: Diagnosis not present

## 2022-08-20 DIAGNOSIS — I119 Hypertensive heart disease without heart failure: Secondary | ICD-10-CM | POA: Diagnosis not present

## 2022-08-24 ENCOUNTER — Ambulatory Visit: Payer: Medicare HMO | Admitting: Family Medicine

## 2022-08-24 DIAGNOSIS — M1611 Unilateral primary osteoarthritis, right hip: Secondary | ICD-10-CM | POA: Diagnosis not present

## 2022-08-24 DIAGNOSIS — I890 Lymphedema, not elsewhere classified: Secondary | ICD-10-CM | POA: Diagnosis not present

## 2022-08-24 DIAGNOSIS — L97221 Non-pressure chronic ulcer of left calf limited to breakdown of skin: Secondary | ICD-10-CM | POA: Diagnosis not present

## 2022-08-24 DIAGNOSIS — I119 Hypertensive heart disease without heart failure: Secondary | ICD-10-CM | POA: Diagnosis not present

## 2022-08-24 DIAGNOSIS — M48061 Spinal stenosis, lumbar region without neurogenic claudication: Secondary | ICD-10-CM | POA: Diagnosis not present

## 2022-08-24 DIAGNOSIS — L89612 Pressure ulcer of right heel, stage 2: Secondary | ICD-10-CM | POA: Diagnosis not present

## 2022-08-24 DIAGNOSIS — I080 Rheumatic disorders of both mitral and aortic valves: Secondary | ICD-10-CM | POA: Diagnosis not present

## 2022-08-24 DIAGNOSIS — I739 Peripheral vascular disease, unspecified: Secondary | ICD-10-CM | POA: Diagnosis not present

## 2022-08-24 DIAGNOSIS — I872 Venous insufficiency (chronic) (peripheral): Secondary | ICD-10-CM | POA: Diagnosis not present

## 2022-08-28 DIAGNOSIS — M1611 Unilateral primary osteoarthritis, right hip: Secondary | ICD-10-CM | POA: Diagnosis not present

## 2022-08-28 DIAGNOSIS — L97221 Non-pressure chronic ulcer of left calf limited to breakdown of skin: Secondary | ICD-10-CM | POA: Diagnosis not present

## 2022-08-28 DIAGNOSIS — I890 Lymphedema, not elsewhere classified: Secondary | ICD-10-CM | POA: Diagnosis not present

## 2022-08-28 DIAGNOSIS — I119 Hypertensive heart disease without heart failure: Secondary | ICD-10-CM | POA: Diagnosis not present

## 2022-08-28 DIAGNOSIS — M48061 Spinal stenosis, lumbar region without neurogenic claudication: Secondary | ICD-10-CM | POA: Diagnosis not present

## 2022-08-28 DIAGNOSIS — I080 Rheumatic disorders of both mitral and aortic valves: Secondary | ICD-10-CM | POA: Diagnosis not present

## 2022-08-28 DIAGNOSIS — I739 Peripheral vascular disease, unspecified: Secondary | ICD-10-CM | POA: Diagnosis not present

## 2022-08-28 DIAGNOSIS — I872 Venous insufficiency (chronic) (peripheral): Secondary | ICD-10-CM | POA: Diagnosis not present

## 2022-08-28 DIAGNOSIS — L89612 Pressure ulcer of right heel, stage 2: Secondary | ICD-10-CM | POA: Diagnosis not present

## 2022-08-29 DIAGNOSIS — L89612 Pressure ulcer of right heel, stage 2: Secondary | ICD-10-CM | POA: Diagnosis not present

## 2022-08-29 DIAGNOSIS — I739 Peripheral vascular disease, unspecified: Secondary | ICD-10-CM | POA: Diagnosis not present

## 2022-08-29 DIAGNOSIS — M1611 Unilateral primary osteoarthritis, right hip: Secondary | ICD-10-CM | POA: Diagnosis not present

## 2022-08-29 DIAGNOSIS — I872 Venous insufficiency (chronic) (peripheral): Secondary | ICD-10-CM | POA: Diagnosis not present

## 2022-08-29 DIAGNOSIS — L97221 Non-pressure chronic ulcer of left calf limited to breakdown of skin: Secondary | ICD-10-CM | POA: Diagnosis not present

## 2022-08-29 DIAGNOSIS — I890 Lymphedema, not elsewhere classified: Secondary | ICD-10-CM | POA: Diagnosis not present

## 2022-08-29 DIAGNOSIS — I080 Rheumatic disorders of both mitral and aortic valves: Secondary | ICD-10-CM | POA: Diagnosis not present

## 2022-08-29 DIAGNOSIS — M48061 Spinal stenosis, lumbar region without neurogenic claudication: Secondary | ICD-10-CM | POA: Diagnosis not present

## 2022-08-29 DIAGNOSIS — I119 Hypertensive heart disease without heart failure: Secondary | ICD-10-CM | POA: Diagnosis not present

## 2022-08-31 DIAGNOSIS — I119 Hypertensive heart disease without heart failure: Secondary | ICD-10-CM | POA: Diagnosis not present

## 2022-08-31 DIAGNOSIS — M48061 Spinal stenosis, lumbar region without neurogenic claudication: Secondary | ICD-10-CM | POA: Diagnosis not present

## 2022-08-31 DIAGNOSIS — I739 Peripheral vascular disease, unspecified: Secondary | ICD-10-CM | POA: Diagnosis not present

## 2022-08-31 DIAGNOSIS — I890 Lymphedema, not elsewhere classified: Secondary | ICD-10-CM | POA: Diagnosis not present

## 2022-08-31 DIAGNOSIS — I080 Rheumatic disorders of both mitral and aortic valves: Secondary | ICD-10-CM | POA: Diagnosis not present

## 2022-08-31 DIAGNOSIS — L89612 Pressure ulcer of right heel, stage 2: Secondary | ICD-10-CM | POA: Diagnosis not present

## 2022-08-31 DIAGNOSIS — I872 Venous insufficiency (chronic) (peripheral): Secondary | ICD-10-CM | POA: Diagnosis not present

## 2022-08-31 DIAGNOSIS — M1611 Unilateral primary osteoarthritis, right hip: Secondary | ICD-10-CM | POA: Diagnosis not present

## 2022-08-31 DIAGNOSIS — L97221 Non-pressure chronic ulcer of left calf limited to breakdown of skin: Secondary | ICD-10-CM | POA: Diagnosis not present

## 2022-09-03 DIAGNOSIS — I739 Peripheral vascular disease, unspecified: Secondary | ICD-10-CM | POA: Diagnosis not present

## 2022-09-03 DIAGNOSIS — I119 Hypertensive heart disease without heart failure: Secondary | ICD-10-CM | POA: Diagnosis not present

## 2022-09-03 DIAGNOSIS — I890 Lymphedema, not elsewhere classified: Secondary | ICD-10-CM | POA: Diagnosis not present

## 2022-09-03 DIAGNOSIS — M48061 Spinal stenosis, lumbar region without neurogenic claudication: Secondary | ICD-10-CM | POA: Diagnosis not present

## 2022-09-03 DIAGNOSIS — I872 Venous insufficiency (chronic) (peripheral): Secondary | ICD-10-CM | POA: Diagnosis not present

## 2022-09-03 DIAGNOSIS — M1611 Unilateral primary osteoarthritis, right hip: Secondary | ICD-10-CM | POA: Diagnosis not present

## 2022-09-03 DIAGNOSIS — L89612 Pressure ulcer of right heel, stage 2: Secondary | ICD-10-CM | POA: Diagnosis not present

## 2022-09-03 DIAGNOSIS — L97221 Non-pressure chronic ulcer of left calf limited to breakdown of skin: Secondary | ICD-10-CM | POA: Diagnosis not present

## 2022-09-03 DIAGNOSIS — I080 Rheumatic disorders of both mitral and aortic valves: Secondary | ICD-10-CM | POA: Diagnosis not present

## 2022-09-08 DIAGNOSIS — L97221 Non-pressure chronic ulcer of left calf limited to breakdown of skin: Secondary | ICD-10-CM | POA: Diagnosis not present

## 2022-09-08 DIAGNOSIS — I119 Hypertensive heart disease without heart failure: Secondary | ICD-10-CM | POA: Diagnosis not present

## 2022-09-08 DIAGNOSIS — I872 Venous insufficiency (chronic) (peripheral): Secondary | ICD-10-CM | POA: Diagnosis not present

## 2022-09-08 DIAGNOSIS — M48061 Spinal stenosis, lumbar region without neurogenic claudication: Secondary | ICD-10-CM | POA: Diagnosis not present

## 2022-09-08 DIAGNOSIS — I080 Rheumatic disorders of both mitral and aortic valves: Secondary | ICD-10-CM | POA: Diagnosis not present

## 2022-09-08 DIAGNOSIS — I890 Lymphedema, not elsewhere classified: Secondary | ICD-10-CM | POA: Diagnosis not present

## 2022-09-08 DIAGNOSIS — M1611 Unilateral primary osteoarthritis, right hip: Secondary | ICD-10-CM | POA: Diagnosis not present

## 2022-09-08 DIAGNOSIS — I739 Peripheral vascular disease, unspecified: Secondary | ICD-10-CM | POA: Diagnosis not present

## 2022-09-08 DIAGNOSIS — L89612 Pressure ulcer of right heel, stage 2: Secondary | ICD-10-CM | POA: Diagnosis not present

## 2022-09-11 DIAGNOSIS — I872 Venous insufficiency (chronic) (peripheral): Secondary | ICD-10-CM | POA: Diagnosis not present

## 2022-09-11 DIAGNOSIS — I119 Hypertensive heart disease without heart failure: Secondary | ICD-10-CM | POA: Diagnosis not present

## 2022-09-11 DIAGNOSIS — L97221 Non-pressure chronic ulcer of left calf limited to breakdown of skin: Secondary | ICD-10-CM | POA: Diagnosis not present

## 2022-09-11 DIAGNOSIS — I739 Peripheral vascular disease, unspecified: Secondary | ICD-10-CM | POA: Diagnosis not present

## 2022-09-11 DIAGNOSIS — M1611 Unilateral primary osteoarthritis, right hip: Secondary | ICD-10-CM | POA: Diagnosis not present

## 2022-09-11 DIAGNOSIS — L89612 Pressure ulcer of right heel, stage 2: Secondary | ICD-10-CM | POA: Diagnosis not present

## 2022-09-11 DIAGNOSIS — M48061 Spinal stenosis, lumbar region without neurogenic claudication: Secondary | ICD-10-CM | POA: Diagnosis not present

## 2022-09-11 DIAGNOSIS — I890 Lymphedema, not elsewhere classified: Secondary | ICD-10-CM | POA: Diagnosis not present

## 2022-09-11 DIAGNOSIS — I080 Rheumatic disorders of both mitral and aortic valves: Secondary | ICD-10-CM | POA: Diagnosis not present

## 2022-09-14 DIAGNOSIS — L97221 Non-pressure chronic ulcer of left calf limited to breakdown of skin: Secondary | ICD-10-CM | POA: Diagnosis not present

## 2022-09-14 DIAGNOSIS — M1611 Unilateral primary osteoarthritis, right hip: Secondary | ICD-10-CM | POA: Diagnosis not present

## 2022-09-14 DIAGNOSIS — I119 Hypertensive heart disease without heart failure: Secondary | ICD-10-CM | POA: Diagnosis not present

## 2022-09-14 DIAGNOSIS — I872 Venous insufficiency (chronic) (peripheral): Secondary | ICD-10-CM | POA: Diagnosis not present

## 2022-09-14 DIAGNOSIS — I739 Peripheral vascular disease, unspecified: Secondary | ICD-10-CM | POA: Diagnosis not present

## 2022-09-14 DIAGNOSIS — M48061 Spinal stenosis, lumbar region without neurogenic claudication: Secondary | ICD-10-CM | POA: Diagnosis not present

## 2022-09-14 DIAGNOSIS — I080 Rheumatic disorders of both mitral and aortic valves: Secondary | ICD-10-CM | POA: Diagnosis not present

## 2022-09-14 DIAGNOSIS — L89612 Pressure ulcer of right heel, stage 2: Secondary | ICD-10-CM | POA: Diagnosis not present

## 2022-09-14 DIAGNOSIS — I890 Lymphedema, not elsewhere classified: Secondary | ICD-10-CM | POA: Diagnosis not present

## 2022-09-15 ENCOUNTER — Other Ambulatory Visit: Payer: Self-pay | Admitting: Family Medicine

## 2022-09-17 DIAGNOSIS — I739 Peripheral vascular disease, unspecified: Secondary | ICD-10-CM | POA: Diagnosis not present

## 2022-09-17 DIAGNOSIS — I080 Rheumatic disorders of both mitral and aortic valves: Secondary | ICD-10-CM | POA: Diagnosis not present

## 2022-09-17 DIAGNOSIS — I890 Lymphedema, not elsewhere classified: Secondary | ICD-10-CM | POA: Diagnosis not present

## 2022-09-17 DIAGNOSIS — M1611 Unilateral primary osteoarthritis, right hip: Secondary | ICD-10-CM | POA: Diagnosis not present

## 2022-09-17 DIAGNOSIS — I872 Venous insufficiency (chronic) (peripheral): Secondary | ICD-10-CM | POA: Diagnosis not present

## 2022-09-17 DIAGNOSIS — L97221 Non-pressure chronic ulcer of left calf limited to breakdown of skin: Secondary | ICD-10-CM | POA: Diagnosis not present

## 2022-09-17 DIAGNOSIS — L89612 Pressure ulcer of right heel, stage 2: Secondary | ICD-10-CM | POA: Diagnosis not present

## 2022-09-17 DIAGNOSIS — M48061 Spinal stenosis, lumbar region without neurogenic claudication: Secondary | ICD-10-CM | POA: Diagnosis not present

## 2022-09-17 DIAGNOSIS — I119 Hypertensive heart disease without heart failure: Secondary | ICD-10-CM | POA: Diagnosis not present

## 2022-09-21 ENCOUNTER — Telehealth: Payer: Self-pay | Admitting: Family Medicine

## 2022-09-21 DIAGNOSIS — L97221 Non-pressure chronic ulcer of left calf limited to breakdown of skin: Secondary | ICD-10-CM | POA: Diagnosis not present

## 2022-09-21 DIAGNOSIS — I890 Lymphedema, not elsewhere classified: Secondary | ICD-10-CM | POA: Diagnosis not present

## 2022-09-21 DIAGNOSIS — I080 Rheumatic disorders of both mitral and aortic valves: Secondary | ICD-10-CM | POA: Diagnosis not present

## 2022-09-21 DIAGNOSIS — I119 Hypertensive heart disease without heart failure: Secondary | ICD-10-CM | POA: Diagnosis not present

## 2022-09-21 DIAGNOSIS — I872 Venous insufficiency (chronic) (peripheral): Secondary | ICD-10-CM | POA: Diagnosis not present

## 2022-09-21 DIAGNOSIS — I739 Peripheral vascular disease, unspecified: Secondary | ICD-10-CM | POA: Diagnosis not present

## 2022-09-21 DIAGNOSIS — L89612 Pressure ulcer of right heel, stage 2: Secondary | ICD-10-CM | POA: Diagnosis not present

## 2022-09-21 DIAGNOSIS — M48061 Spinal stenosis, lumbar region without neurogenic claudication: Secondary | ICD-10-CM | POA: Diagnosis not present

## 2022-09-21 DIAGNOSIS — M1611 Unilateral primary osteoarthritis, right hip: Secondary | ICD-10-CM | POA: Diagnosis not present

## 2022-09-21 NOTE — Telephone Encounter (Signed)
error 

## 2022-09-23 ENCOUNTER — Telehealth: Payer: Self-pay | Admitting: Family Medicine

## 2022-09-23 DIAGNOSIS — L89612 Pressure ulcer of right heel, stage 2: Secondary | ICD-10-CM | POA: Diagnosis not present

## 2022-09-23 DIAGNOSIS — I119 Hypertensive heart disease without heart failure: Secondary | ICD-10-CM | POA: Diagnosis not present

## 2022-09-23 DIAGNOSIS — M48061 Spinal stenosis, lumbar region without neurogenic claudication: Secondary | ICD-10-CM | POA: Diagnosis not present

## 2022-09-23 DIAGNOSIS — L97221 Non-pressure chronic ulcer of left calf limited to breakdown of skin: Secondary | ICD-10-CM | POA: Diagnosis not present

## 2022-09-23 DIAGNOSIS — I739 Peripheral vascular disease, unspecified: Secondary | ICD-10-CM | POA: Diagnosis not present

## 2022-09-23 DIAGNOSIS — I890 Lymphedema, not elsewhere classified: Secondary | ICD-10-CM | POA: Diagnosis not present

## 2022-09-23 DIAGNOSIS — I080 Rheumatic disorders of both mitral and aortic valves: Secondary | ICD-10-CM | POA: Diagnosis not present

## 2022-09-23 DIAGNOSIS — I872 Venous insufficiency (chronic) (peripheral): Secondary | ICD-10-CM | POA: Diagnosis not present

## 2022-09-23 DIAGNOSIS — M1611 Unilateral primary osteoarthritis, right hip: Secondary | ICD-10-CM | POA: Diagnosis not present

## 2022-09-23 NOTE — Telephone Encounter (Signed)
[name] [name] called [facility] ned verbal orders 2x week 8 and 3 prn visit. For wound care bilateral lower extremity  [phone] (not sure if this is correct went back to phone log could not find a phone number  the exact time [name] [name] called phone cut in and out on voicemail.

## 2022-09-26 DIAGNOSIS — I890 Lymphedema, not elsewhere classified: Secondary | ICD-10-CM | POA: Diagnosis not present

## 2022-09-26 DIAGNOSIS — L89612 Pressure ulcer of right heel, stage 2: Secondary | ICD-10-CM | POA: Diagnosis not present

## 2022-09-26 DIAGNOSIS — M48061 Spinal stenosis, lumbar region without neurogenic claudication: Secondary | ICD-10-CM | POA: Diagnosis not present

## 2022-09-26 DIAGNOSIS — I739 Peripheral vascular disease, unspecified: Secondary | ICD-10-CM | POA: Diagnosis not present

## 2022-09-26 DIAGNOSIS — I119 Hypertensive heart disease without heart failure: Secondary | ICD-10-CM | POA: Diagnosis not present

## 2022-09-26 DIAGNOSIS — I080 Rheumatic disorders of both mitral and aortic valves: Secondary | ICD-10-CM | POA: Diagnosis not present

## 2022-09-26 DIAGNOSIS — M1611 Unilateral primary osteoarthritis, right hip: Secondary | ICD-10-CM | POA: Diagnosis not present

## 2022-09-26 DIAGNOSIS — I872 Venous insufficiency (chronic) (peripheral): Secondary | ICD-10-CM | POA: Diagnosis not present

## 2022-09-26 DIAGNOSIS — L97221 Non-pressure chronic ulcer of left calf limited to breakdown of skin: Secondary | ICD-10-CM | POA: Diagnosis not present

## 2022-09-28 DIAGNOSIS — L89612 Pressure ulcer of right heel, stage 2: Secondary | ICD-10-CM | POA: Diagnosis not present

## 2022-09-28 DIAGNOSIS — I119 Hypertensive heart disease without heart failure: Secondary | ICD-10-CM | POA: Diagnosis not present

## 2022-09-28 DIAGNOSIS — I872 Venous insufficiency (chronic) (peripheral): Secondary | ICD-10-CM | POA: Diagnosis not present

## 2022-09-28 DIAGNOSIS — M1611 Unilateral primary osteoarthritis, right hip: Secondary | ICD-10-CM | POA: Diagnosis not present

## 2022-09-28 DIAGNOSIS — L97221 Non-pressure chronic ulcer of left calf limited to breakdown of skin: Secondary | ICD-10-CM | POA: Diagnosis not present

## 2022-09-28 DIAGNOSIS — I080 Rheumatic disorders of both mitral and aortic valves: Secondary | ICD-10-CM | POA: Diagnosis not present

## 2022-09-28 DIAGNOSIS — M48061 Spinal stenosis, lumbar region without neurogenic claudication: Secondary | ICD-10-CM | POA: Diagnosis not present

## 2022-09-28 DIAGNOSIS — I739 Peripheral vascular disease, unspecified: Secondary | ICD-10-CM | POA: Diagnosis not present

## 2022-09-28 DIAGNOSIS — I890 Lymphedema, not elsewhere classified: Secondary | ICD-10-CM | POA: Diagnosis not present

## 2022-09-28 NOTE — Telephone Encounter (Signed)
Phone number out of service no other way of contacting [name] [name] back will wait for return call.

## 2022-10-06 DIAGNOSIS — M1611 Unilateral primary osteoarthritis, right hip: Secondary | ICD-10-CM | POA: Diagnosis not present

## 2022-10-06 DIAGNOSIS — I872 Venous insufficiency (chronic) (peripheral): Secondary | ICD-10-CM | POA: Diagnosis not present

## 2022-10-06 DIAGNOSIS — I739 Peripheral vascular disease, unspecified: Secondary | ICD-10-CM | POA: Diagnosis not present

## 2022-10-06 DIAGNOSIS — I890 Lymphedema, not elsewhere classified: Secondary | ICD-10-CM | POA: Diagnosis not present

## 2022-10-06 DIAGNOSIS — I080 Rheumatic disorders of both mitral and aortic valves: Secondary | ICD-10-CM | POA: Diagnosis not present

## 2022-10-06 DIAGNOSIS — M48061 Spinal stenosis, lumbar region without neurogenic claudication: Secondary | ICD-10-CM | POA: Diagnosis not present

## 2022-10-06 DIAGNOSIS — L89612 Pressure ulcer of right heel, stage 2: Secondary | ICD-10-CM | POA: Diagnosis not present

## 2022-10-06 DIAGNOSIS — L97221 Non-pressure chronic ulcer of left calf limited to breakdown of skin: Secondary | ICD-10-CM | POA: Diagnosis not present

## 2022-10-06 DIAGNOSIS — I119 Hypertensive heart disease without heart failure: Secondary | ICD-10-CM | POA: Diagnosis not present

## 2022-10-08 ENCOUNTER — Encounter: Payer: Self-pay | Admitting: Family Medicine

## 2022-10-08 ENCOUNTER — Ambulatory Visit (INDEPENDENT_AMBULATORY_CARE_PROVIDER_SITE_OTHER): Payer: Medicare HMO | Admitting: Family Medicine

## 2022-10-08 VITALS — BP 116/68 | HR 92 | Ht 65.0 in

## 2022-10-08 DIAGNOSIS — E559 Vitamin D deficiency, unspecified: Secondary | ICD-10-CM

## 2022-10-08 DIAGNOSIS — Z1329 Encounter for screening for other suspected endocrine disorder: Secondary | ICD-10-CM

## 2022-10-08 DIAGNOSIS — L97825 Non-pressure chronic ulcer of other part of left lower leg with muscle involvement without evidence of necrosis: Secondary | ICD-10-CM

## 2022-10-08 DIAGNOSIS — E785 Hyperlipidemia, unspecified: Secondary | ICD-10-CM

## 2022-10-08 DIAGNOSIS — B028 Zoster with other complications: Secondary | ICD-10-CM | POA: Diagnosis not present

## 2022-10-08 DIAGNOSIS — I350 Nonrheumatic aortic (valve) stenosis: Secondary | ICD-10-CM | POA: Diagnosis not present

## 2022-10-08 DIAGNOSIS — I872 Venous insufficiency (chronic) (peripheral): Secondary | ICD-10-CM

## 2022-10-08 DIAGNOSIS — N3941 Urge incontinence: Secondary | ICD-10-CM

## 2022-10-08 DIAGNOSIS — I10 Essential (primary) hypertension: Secondary | ICD-10-CM

## 2022-10-08 MED ORDER — MIRABEGRON ER 25 MG PO TB24: 25.0000 mg | ORAL_TABLET | Freq: Every day | ORAL | 1 refills | Status: DC

## 2022-10-08 NOTE — Patient Instructions (Addendum)
Annual exam in early October, call if you need me sooner, flu vaccine  Fasting lipid,cmp and EGFR , TSH and vit D 3 to 5 days before next visit  New medication for urinary incontinence  is sent,  we will need to PA medication,   Thanks for choosing [facility], we consider it a privelige to serve you.

## 2022-10-09 DIAGNOSIS — I739 Peripheral vascular disease, unspecified: Secondary | ICD-10-CM | POA: Diagnosis not present

## 2022-10-09 DIAGNOSIS — L89612 Pressure ulcer of right heel, stage 2: Secondary | ICD-10-CM | POA: Diagnosis not present

## 2022-10-09 DIAGNOSIS — I890 Lymphedema, not elsewhere classified: Secondary | ICD-10-CM | POA: Diagnosis not present

## 2022-10-09 DIAGNOSIS — I080 Rheumatic disorders of both mitral and aortic valves: Secondary | ICD-10-CM | POA: Diagnosis not present

## 2022-10-09 DIAGNOSIS — L97221 Non-pressure chronic ulcer of left calf limited to breakdown of skin: Secondary | ICD-10-CM | POA: Diagnosis not present

## 2022-10-09 DIAGNOSIS — M48061 Spinal stenosis, lumbar region without neurogenic claudication: Secondary | ICD-10-CM | POA: Diagnosis not present

## 2022-10-09 DIAGNOSIS — I872 Venous insufficiency (chronic) (peripheral): Secondary | ICD-10-CM | POA: Diagnosis not present

## 2022-10-09 DIAGNOSIS — M1611 Unilateral primary osteoarthritis, right hip: Secondary | ICD-10-CM | POA: Diagnosis not present

## 2022-10-09 DIAGNOSIS — I119 Hypertensive heart disease without heart failure: Secondary | ICD-10-CM | POA: Diagnosis not present

## 2022-10-12 ENCOUNTER — Encounter: Payer: Self-pay | Admitting: Family Medicine

## 2022-10-12 DIAGNOSIS — I119 Hypertensive heart disease without heart failure: Secondary | ICD-10-CM | POA: Diagnosis not present

## 2022-10-12 DIAGNOSIS — L89612 Pressure ulcer of right heel, stage 2: Secondary | ICD-10-CM | POA: Diagnosis not present

## 2022-10-12 DIAGNOSIS — I080 Rheumatic disorders of both mitral and aortic valves: Secondary | ICD-10-CM | POA: Diagnosis not present

## 2022-10-12 DIAGNOSIS — I872 Venous insufficiency (chronic) (peripheral): Secondary | ICD-10-CM | POA: Diagnosis not present

## 2022-10-12 DIAGNOSIS — L97221 Non-pressure chronic ulcer of left calf limited to breakdown of skin: Secondary | ICD-10-CM | POA: Diagnosis not present

## 2022-10-12 DIAGNOSIS — I890 Lymphedema, not elsewhere classified: Secondary | ICD-10-CM | POA: Diagnosis not present

## 2022-10-12 DIAGNOSIS — M48061 Spinal stenosis, lumbar region without neurogenic claudication: Secondary | ICD-10-CM | POA: Diagnosis not present

## 2022-10-12 DIAGNOSIS — I739 Peripheral vascular disease, unspecified: Secondary | ICD-10-CM | POA: Diagnosis not present

## 2022-10-12 DIAGNOSIS — M1611 Unilateral primary osteoarthritis, right hip: Secondary | ICD-10-CM | POA: Diagnosis not present

## 2022-10-12 NOTE — Progress Notes (Signed)
   [name] [name]     MRN: [id number]      DOB: December 13, 1932  Chief Complaint  Patient presents with   Follow-up    Follow up patient reports trying to get legs to heal    HPI [name] is here for follow up and re-evaluation of chronic medical conditions, medication management and review of any available recent lab and radiology data.  Preventive health is updated, specifically   Immunization. This still needs to be updated Still waiting on date for eye surgery. H/H is wrapping legs 2 to 3 times weekly and ulcers are reportedly healing, no interest in wound center currently, wounds ar weepingless and less swollen per pt  ROS Denies recent fever or chills. Denies sinus pressure, nasal congestion, ear pain or sore throat. Denies chest congestion, productive cough or wheezing. Denies chest pains, palpitations and leg swelling Denies abdominal pain, nausea, vomiting,diarrhea or constipation.   C/o disabling urinary incontinence, wants med tria again Chronic  joint pain,  and limitation in mobility. Denies headaches, seizures, numbness, or tingling. Mild  depression, as dealing with loss of son, anxiety or insomnia.  PE  BP 116/68 (BP Location: Right Arm, Patient Position: Sitting, Cuff Size: Large)   Pulse 92   Ht 5\' 5"  (1.651 m)   SpO2 94%   BMI 27.11 kg/m   Patient alert and oriented and in no cardiopulmonary distress.  HEENT: No facial asymmetry, EOMI,     Neck decreased ROM .  Chest: Clear to auscultation bilaterally.  CVS: S1, S2 systolic murmur, no S3.Regular rate.  ABD: Soft non tender.   Ext: trace edema  MS: decreased  ROM spine, shoulders, hips and knees.  Skin: bilateral dressings on LE   with clear drainage noted on right lower ext Psych: Good eye contact, normal affect. Memory intact not anxious or depressed appearing.  CNS: CN 2-12 intact, power,  normal throughout.no focal deficits noted.   Assessment & Plan  Venous stasis ulcers (HCC) Chronic , managed  by H/H, will refer to wound clinic if indicated by H/H, per pt generally "doing well" now  Urinary incontinence Trial of miberagon, she has ahd in the past, will need pA  Zoster Pain has resolved, needs vaccines  Hyperlipidemia LDL goal <100 Hyperlipidemia:Low fat diet discussed and encouraged.   Lipid Panel  Lab Results  Component Value Date   CHOL 197 04/03/2022   HDL 107 04/03/2022   LDLCALC 76 04/03/2022   TRIG 77 04/03/2022   CHOLHDL 1.8 04/03/2022     Updated lab needed at/ before next visit.   Aortic stenosis Being followed by Cardiology  Vitamin D deficiency Updated lab needed at/ before next visit.

## 2022-10-12 NOTE — Assessment & Plan Note (Signed)
Hyperlipidemia:Low fat diet discussed and encouraged.   Lipid Panel  Lab Results  Component Value Date   CHOL 197 04/03/2022   HDL 107 04/03/2022   LDLCALC 76 04/03/2022   TRIG 77 04/03/2022   CHOLHDL 1.8 04/03/2022     Updated lab needed at/ before next visit.

## 2022-10-12 NOTE — Assessment & Plan Note (Signed)
Being followed by Cardiology

## 2022-10-12 NOTE — Assessment & Plan Note (Signed)
Trial of miberagon, she has ahd in the past, will need pA

## 2022-10-12 NOTE — Assessment & Plan Note (Signed)
Chronic , managed by H/H, will refer to wound clinic if indicated by H/H, per pt generally "doing well" now

## 2022-10-12 NOTE — Assessment & Plan Note (Signed)
Pain has resolved, needs vaccines

## 2022-10-12 NOTE — Assessment & Plan Note (Signed)
Updated lab needed at/ before next visit.   

## 2022-10-13 ENCOUNTER — Other Ambulatory Visit: Payer: Self-pay | Admitting: Hematology

## 2022-10-13 ENCOUNTER — Other Ambulatory Visit: Payer: Self-pay | Admitting: Neurology

## 2022-10-15 DIAGNOSIS — L89612 Pressure ulcer of right heel, stage 2: Secondary | ICD-10-CM | POA: Diagnosis not present

## 2022-10-15 DIAGNOSIS — I080 Rheumatic disorders of both mitral and aortic valves: Secondary | ICD-10-CM | POA: Diagnosis not present

## 2022-10-15 DIAGNOSIS — I890 Lymphedema, not elsewhere classified: Secondary | ICD-10-CM | POA: Diagnosis not present

## 2022-10-15 DIAGNOSIS — L97221 Non-pressure chronic ulcer of left calf limited to breakdown of skin: Secondary | ICD-10-CM | POA: Diagnosis not present

## 2022-10-15 DIAGNOSIS — I872 Venous insufficiency (chronic) (peripheral): Secondary | ICD-10-CM | POA: Diagnosis not present

## 2022-10-15 DIAGNOSIS — M1611 Unilateral primary osteoarthritis, right hip: Secondary | ICD-10-CM | POA: Diagnosis not present

## 2022-10-15 DIAGNOSIS — M48061 Spinal stenosis, lumbar region without neurogenic claudication: Secondary | ICD-10-CM | POA: Diagnosis not present

## 2022-10-15 DIAGNOSIS — I119 Hypertensive heart disease without heart failure: Secondary | ICD-10-CM | POA: Diagnosis not present

## 2022-10-15 DIAGNOSIS — I739 Peripheral vascular disease, unspecified: Secondary | ICD-10-CM | POA: Diagnosis not present

## 2022-10-19 DIAGNOSIS — I739 Peripheral vascular disease, unspecified: Secondary | ICD-10-CM | POA: Diagnosis not present

## 2022-10-19 DIAGNOSIS — I890 Lymphedema, not elsewhere classified: Secondary | ICD-10-CM | POA: Diagnosis not present

## 2022-10-19 DIAGNOSIS — I119 Hypertensive heart disease without heart failure: Secondary | ICD-10-CM | POA: Diagnosis not present

## 2022-10-19 DIAGNOSIS — L89612 Pressure ulcer of right heel, stage 2: Secondary | ICD-10-CM | POA: Diagnosis not present

## 2022-10-19 DIAGNOSIS — I872 Venous insufficiency (chronic) (peripheral): Secondary | ICD-10-CM | POA: Diagnosis not present

## 2022-10-19 DIAGNOSIS — L97221 Non-pressure chronic ulcer of left calf limited to breakdown of skin: Secondary | ICD-10-CM | POA: Diagnosis not present

## 2022-10-19 DIAGNOSIS — I080 Rheumatic disorders of both mitral and aortic valves: Secondary | ICD-10-CM | POA: Diagnosis not present

## 2022-10-19 DIAGNOSIS — M48061 Spinal stenosis, lumbar region without neurogenic claudication: Secondary | ICD-10-CM | POA: Diagnosis not present

## 2022-10-19 DIAGNOSIS — M1611 Unilateral primary osteoarthritis, right hip: Secondary | ICD-10-CM | POA: Diagnosis not present

## 2022-10-22 DIAGNOSIS — M48061 Spinal stenosis, lumbar region without neurogenic claudication: Secondary | ICD-10-CM | POA: Diagnosis not present

## 2022-10-22 DIAGNOSIS — I080 Rheumatic disorders of both mitral and aortic valves: Secondary | ICD-10-CM | POA: Diagnosis not present

## 2022-10-22 DIAGNOSIS — I890 Lymphedema, not elsewhere classified: Secondary | ICD-10-CM | POA: Diagnosis not present

## 2022-10-22 DIAGNOSIS — L97221 Non-pressure chronic ulcer of left calf limited to breakdown of skin: Secondary | ICD-10-CM | POA: Diagnosis not present

## 2022-10-22 DIAGNOSIS — I739 Peripheral vascular disease, unspecified: Secondary | ICD-10-CM | POA: Diagnosis not present

## 2022-10-22 DIAGNOSIS — I872 Venous insufficiency (chronic) (peripheral): Secondary | ICD-10-CM | POA: Diagnosis not present

## 2022-10-22 DIAGNOSIS — I119 Hypertensive heart disease without heart failure: Secondary | ICD-10-CM | POA: Diagnosis not present

## 2022-10-22 DIAGNOSIS — M1611 Unilateral primary osteoarthritis, right hip: Secondary | ICD-10-CM | POA: Diagnosis not present

## 2022-10-22 DIAGNOSIS — L89612 Pressure ulcer of right heel, stage 2: Secondary | ICD-10-CM | POA: Diagnosis not present

## 2022-10-23 ENCOUNTER — Other Ambulatory Visit: Payer: Self-pay | Admitting: Hematology

## 2022-10-26 DIAGNOSIS — H25812 Combined forms of age-related cataract, left eye: Secondary | ICD-10-CM | POA: Diagnosis not present

## 2022-10-27 ENCOUNTER — Telehealth: Payer: Self-pay

## 2022-10-27 NOTE — Telephone Encounter (Signed)
Pts last OV was 02/11/22 with Dr. Eldridge Dace.     Pre-operative Risk Assessment    Patient Name: [name] [name]  DOB: 1933-02-27 MRN: [id number]      Request for Surgical Clearance    Procedure:   Cataract Extraction by PE, IOL-Left  Date of Surgery:  Clearance 11/18/22                                 Surgeon:  Dr. Arnette Felts Surgeon's Group or Practice Name:  [facility] Phone number:  [phone] Fax number:  [phone]   Type of Clearance Requested:   - Medical  - Pharmacy:  Hold Apixaban (Eliquis) pt will need instructions on when/if to hold   Type of Anesthesia:   IV Sedation   Additional requests/questions:    Signed, Zada Finders   10/27/2022, 7:44 AM

## 2022-10-27 NOTE — Telephone Encounter (Signed)
   Patient Name: DACOTA FERRAND  DOB: [birth year] MRN: [id number]  Primary Cardiologist: Lance Muss, MD  Chart reviewed as part of pre-operative protocol coverage. Cataract extractions are recognized in guidelines as low risk surgeries that do not typically require specific preoperative testing or holding of blood thinner therapy. Therefore, given past medical history and time since last visit, based on ACC/AHA guidelines, CAROLYNA SNODDY would be at acceptable risk for the planned procedure without further cardiovascular testing.   I will route this recommendation to the requesting party via Epic fax function and remove from pre-op pool.  Please call with questions.  Carlos Levering, NP 10/27/2022, 11:33 AM

## 2022-10-28 DIAGNOSIS — L89612 Pressure ulcer of right heel, stage 2: Secondary | ICD-10-CM | POA: Diagnosis not present

## 2022-10-28 DIAGNOSIS — M48061 Spinal stenosis, lumbar region without neurogenic claudication: Secondary | ICD-10-CM | POA: Diagnosis not present

## 2022-10-28 DIAGNOSIS — I739 Peripheral vascular disease, unspecified: Secondary | ICD-10-CM | POA: Diagnosis not present

## 2022-10-28 DIAGNOSIS — I119 Hypertensive heart disease without heart failure: Secondary | ICD-10-CM | POA: Diagnosis not present

## 2022-10-28 DIAGNOSIS — I872 Venous insufficiency (chronic) (peripheral): Secondary | ICD-10-CM | POA: Diagnosis not present

## 2022-10-28 DIAGNOSIS — I890 Lymphedema, not elsewhere classified: Secondary | ICD-10-CM | POA: Diagnosis not present

## 2022-10-28 DIAGNOSIS — M1611 Unilateral primary osteoarthritis, right hip: Secondary | ICD-10-CM | POA: Diagnosis not present

## 2022-10-28 DIAGNOSIS — L97221 Non-pressure chronic ulcer of left calf limited to breakdown of skin: Secondary | ICD-10-CM | POA: Diagnosis not present

## 2022-10-28 DIAGNOSIS — I080 Rheumatic disorders of both mitral and aortic valves: Secondary | ICD-10-CM | POA: Diagnosis not present

## 2022-10-29 ENCOUNTER — Telehealth: Payer: Self-pay | Admitting: Family Medicine

## 2022-10-29 NOTE — Telephone Encounter (Signed)
Needs non fasting cBc, chem 7 next week for medical clearance for cataract surgery, pls let her know and order, thanks

## 2022-10-30 ENCOUNTER — Other Ambulatory Visit: Payer: Self-pay

## 2022-10-30 DIAGNOSIS — H353221 Exudative age-related macular degeneration, left eye, with active choroidal neovascularization: Secondary | ICD-10-CM | POA: Diagnosis not present

## 2022-10-30 DIAGNOSIS — Z01818 Encounter for other preprocedural examination: Secondary | ICD-10-CM

## 2022-10-30 DIAGNOSIS — I739 Peripheral vascular disease, unspecified: Secondary | ICD-10-CM | POA: Diagnosis not present

## 2022-10-30 DIAGNOSIS — I890 Lymphedema, not elsewhere classified: Secondary | ICD-10-CM | POA: Diagnosis not present

## 2022-10-30 DIAGNOSIS — I080 Rheumatic disorders of both mitral and aortic valves: Secondary | ICD-10-CM | POA: Diagnosis not present

## 2022-10-30 DIAGNOSIS — L89612 Pressure ulcer of right heel, stage 2: Secondary | ICD-10-CM | POA: Diagnosis not present

## 2022-10-30 DIAGNOSIS — L97221 Non-pressure chronic ulcer of left calf limited to breakdown of skin: Secondary | ICD-10-CM | POA: Diagnosis not present

## 2022-10-30 DIAGNOSIS — M48061 Spinal stenosis, lumbar region without neurogenic claudication: Secondary | ICD-10-CM | POA: Diagnosis not present

## 2022-10-30 DIAGNOSIS — I119 Hypertensive heart disease without heart failure: Secondary | ICD-10-CM | POA: Diagnosis not present

## 2022-10-30 DIAGNOSIS — M1611 Unilateral primary osteoarthritis, right hip: Secondary | ICD-10-CM | POA: Diagnosis not present

## 2022-10-30 DIAGNOSIS — I872 Venous insufficiency (chronic) (peripheral): Secondary | ICD-10-CM | POA: Diagnosis not present

## 2022-10-30 NOTE — Telephone Encounter (Signed)
LVM letting patient know labs are ordered.

## 2022-11-03 ENCOUNTER — Telehealth: Payer: Self-pay | Admitting: Family Medicine

## 2022-11-03 NOTE — Telephone Encounter (Signed)
Deonna with [facility] calling to follow up on medical clearance for pt- sent over on 8/5. Please advise [phone] Thank you

## 2022-11-03 NOTE — Telephone Encounter (Signed)
Number gives fax signal when trying to return call waiting on patient to come have blood work this week before signing medical clearance

## 2022-11-04 DIAGNOSIS — I119 Hypertensive heart disease without heart failure: Secondary | ICD-10-CM | POA: Diagnosis not present

## 2022-11-04 DIAGNOSIS — I739 Peripheral vascular disease, unspecified: Secondary | ICD-10-CM | POA: Diagnosis not present

## 2022-11-04 DIAGNOSIS — I080 Rheumatic disorders of both mitral and aortic valves: Secondary | ICD-10-CM | POA: Diagnosis not present

## 2022-11-04 DIAGNOSIS — L97221 Non-pressure chronic ulcer of left calf limited to breakdown of skin: Secondary | ICD-10-CM | POA: Diagnosis not present

## 2022-11-04 DIAGNOSIS — L89612 Pressure ulcer of right heel, stage 2: Secondary | ICD-10-CM | POA: Diagnosis not present

## 2022-11-04 DIAGNOSIS — M48061 Spinal stenosis, lumbar region without neurogenic claudication: Secondary | ICD-10-CM | POA: Diagnosis not present

## 2022-11-04 DIAGNOSIS — I890 Lymphedema, not elsewhere classified: Secondary | ICD-10-CM | POA: Diagnosis not present

## 2022-11-04 DIAGNOSIS — M1611 Unilateral primary osteoarthritis, right hip: Secondary | ICD-10-CM | POA: Diagnosis not present

## 2022-11-04 DIAGNOSIS — I872 Venous insufficiency (chronic) (peripheral): Secondary | ICD-10-CM | POA: Diagnosis not present

## 2022-11-05 ENCOUNTER — Other Ambulatory Visit: Payer: Self-pay

## 2022-11-05 DIAGNOSIS — Z01818 Encounter for other preprocedural examination: Secondary | ICD-10-CM | POA: Diagnosis not present

## 2022-11-05 DIAGNOSIS — E559 Vitamin D deficiency, unspecified: Secondary | ICD-10-CM

## 2022-11-05 MED ORDER — VITAMIN D 25 MCG (1000 UNIT) PO TABS
1000.0000 [IU] | ORAL_TABLET | Freq: Every day | ORAL | 3 refills | Status: DC
Start: 2022-11-05 — End: 2023-12-14

## 2022-11-06 DIAGNOSIS — M1611 Unilateral primary osteoarthritis, right hip: Secondary | ICD-10-CM | POA: Diagnosis not present

## 2022-11-06 DIAGNOSIS — M48061 Spinal stenosis, lumbar region without neurogenic claudication: Secondary | ICD-10-CM | POA: Diagnosis not present

## 2022-11-06 DIAGNOSIS — L89612 Pressure ulcer of right heel, stage 2: Secondary | ICD-10-CM | POA: Diagnosis not present

## 2022-11-06 DIAGNOSIS — I080 Rheumatic disorders of both mitral and aortic valves: Secondary | ICD-10-CM | POA: Diagnosis not present

## 2022-11-06 DIAGNOSIS — L97221 Non-pressure chronic ulcer of left calf limited to breakdown of skin: Secondary | ICD-10-CM | POA: Diagnosis not present

## 2022-11-06 DIAGNOSIS — I872 Venous insufficiency (chronic) (peripheral): Secondary | ICD-10-CM | POA: Diagnosis not present

## 2022-11-06 DIAGNOSIS — I119 Hypertensive heart disease without heart failure: Secondary | ICD-10-CM | POA: Diagnosis not present

## 2022-11-06 DIAGNOSIS — I739 Peripheral vascular disease, unspecified: Secondary | ICD-10-CM | POA: Diagnosis not present

## 2022-11-06 DIAGNOSIS — I890 Lymphedema, not elsewhere classified: Secondary | ICD-10-CM | POA: Diagnosis not present

## 2022-11-09 DIAGNOSIS — I119 Hypertensive heart disease without heart failure: Secondary | ICD-10-CM | POA: Diagnosis not present

## 2022-11-09 DIAGNOSIS — I739 Peripheral vascular disease, unspecified: Secondary | ICD-10-CM | POA: Diagnosis not present

## 2022-11-09 DIAGNOSIS — I890 Lymphedema, not elsewhere classified: Secondary | ICD-10-CM | POA: Diagnosis not present

## 2022-11-09 DIAGNOSIS — M1611 Unilateral primary osteoarthritis, right hip: Secondary | ICD-10-CM | POA: Diagnosis not present

## 2022-11-09 DIAGNOSIS — I872 Venous insufficiency (chronic) (peripheral): Secondary | ICD-10-CM | POA: Diagnosis not present

## 2022-11-09 DIAGNOSIS — M48061 Spinal stenosis, lumbar region without neurogenic claudication: Secondary | ICD-10-CM | POA: Diagnosis not present

## 2022-11-09 DIAGNOSIS — I080 Rheumatic disorders of both mitral and aortic valves: Secondary | ICD-10-CM | POA: Diagnosis not present

## 2022-11-09 DIAGNOSIS — L89612 Pressure ulcer of right heel, stage 2: Secondary | ICD-10-CM | POA: Diagnosis not present

## 2022-11-09 DIAGNOSIS — L97221 Non-pressure chronic ulcer of left calf limited to breakdown of skin: Secondary | ICD-10-CM | POA: Diagnosis not present

## 2022-11-10 ENCOUNTER — Ambulatory Visit (INDEPENDENT_AMBULATORY_CARE_PROVIDER_SITE_OTHER): Payer: Medicare HMO | Admitting: Family Medicine

## 2022-11-10 ENCOUNTER — Encounter: Payer: Self-pay | Admitting: Family Medicine

## 2022-11-10 VITALS — BP 127/78 | HR 82 | Ht 65.0 in

## 2022-11-10 DIAGNOSIS — I825Z2 Chronic embolism and thrombosis of unspecified deep veins of left distal lower extremity: Secondary | ICD-10-CM

## 2022-11-10 DIAGNOSIS — B369 Superficial mycosis, unspecified: Secondary | ICD-10-CM | POA: Diagnosis not present

## 2022-11-10 DIAGNOSIS — E785 Hyperlipidemia, unspecified: Secondary | ICD-10-CM | POA: Diagnosis not present

## 2022-11-10 DIAGNOSIS — R600 Localized edema: Secondary | ICD-10-CM

## 2022-11-10 DIAGNOSIS — Z9181 History of falling: Secondary | ICD-10-CM

## 2022-11-10 MED ORDER — MONTELUKAST SODIUM 10 MG PO TABS
10.0000 mg | ORAL_TABLET | Freq: Every day | ORAL | 1 refills | Status: DC
Start: 2022-11-10 — End: 2022-12-08

## 2022-11-10 MED ORDER — CLOTRIMAZOLE-BETAMETHASONE 1-0.05 % EX CREA: 1.0000 | TOPICAL_CREAM | Freq: Two times a day (BID) | CUTANEOUS | 1 refills | Status: DC

## 2022-11-10 MED ORDER — TERBINAFINE HCL 250 MG PO TABS
250.0000 mg | ORAL_TABLET | Freq: Every day | ORAL | 0 refills | Status: AC
Start: 2022-11-10 — End: ?

## 2022-11-10 NOTE — Telephone Encounter (Signed)
Faxed

## 2022-11-10 NOTE — Telephone Encounter (Signed)
Patient being seen today clearance will be signed and faxed

## 2022-11-10 NOTE — Progress Notes (Signed)
   [name] [name]     MRN: [id number]      DOB: 1932/07/31  Chief Complaint  Patient presents with   Follow-up    Follow up nasal drainage, legs wrapped    HPI [name] [name] is here for follow up and re-evaluation of chronic medical conditions, medication management and review of any available recent lab and radiology data.  Preventive health is updated, specifically   Immunization.   Awaiting expectantly cataract surgery, medically cleared and has been cleared by Cardiology in the past. C/o itchy rash on upper chest and back  for weeks, incrasing in size ROS Denies recent fever or chills Denies sinus pressure, c/o nasal congestion, denies ear pain or sore throat. Denies chest congestion, productive cough or wheezing. Denies chest pains, palpitations or orthopnea, has chronic bilateral leg edema  Denies abdominal pain, nausea, vomiting,diarrhea or constipation.   Denies dysuria, frequency, hesitancy or incontinence. Chronic  joint pain, and limitation in mobility. Denies headaches, seizures, numbness, or tingling. Denies depression, anxiety or insomnia.  PE  BP 127/78 (BP Location: Left Arm, Patient Position: Sitting, Cuff Size: Normal)   Pulse 82   Ht 5\' 5"  (1.651 m)   SpO2 91%   BMI 27.11 kg/m   Patient alert and oriented and in no cardiopulmonary distress.  HEENT: No facial asymmetry, EOMI,     Neck decreased ROM Chest: Clear to auscultation bilaterally.Decreased though adequate air entry  CVS: S1, S2 systolic murmurs, no S3.Regular rate.  ABD: Soft non tender.   Ext: legs wrapped bilaterally, no drainage noted on wrap  MS: markedly decreased  ROM spine, shoulders, hips and knees.  Skin: Intact, fungal  rash noted.  Psych: Good eye contact, normal affect.  not anxious or depressed appearing.  CNS: CN 2-12 intact, power,  normal throughout.no focal deficits noted.   Assessment & Plan  Hyperlipidemia LDL goal <100 Hyperlipidemia:Low fat diet discussed and  encouraged.   Lipid Panel  Lab Results  Component Value Date   CHOL 197 04/03/2022   HDL 107 04/03/2022   LDLCALC 76 04/03/2022   TRIG 77 04/03/2022   CHOLHDL 1.8 04/03/2022       Lower extremity edema Chronic venous stasis requiring twice weekly pressure wraps  home health follows   At high risk for falls per [name] [name] fall risk assessment scale Home safey reviewed no falls reported  Chronic deep vein thrombosis (DVT) of distal vein of left lower extremity (HCC) Maintained on eliquis   Dermatomycosis Short term oral  terbinafine and topical prescribed long term as needed

## 2022-11-10 NOTE — Patient Instructions (Signed)
Keep October appointment  as before, call if you need me sooner  Terbinafine tablet sent for rash and clotrimazole/betamethasone cream  Montelukast tablet is sent  for allergy/ nasal congestion  You are medically cleared for eye surgery  Thanks for choosing [facility], we consider it a privelige to serve you.

## 2022-11-10 NOTE — Telephone Encounter (Signed)
Deonna called back asking status of medical clearance call back # [phone] direct line or office [phone] ext [phone] reaches the PA office and ask for [facility] with [facility]. She asked to call office number first.

## 2022-11-10 NOTE — Telephone Encounter (Signed)
Samuel Germany called needs the notes and the clearance faxed to  # to [phone]

## 2022-11-12 DIAGNOSIS — M48061 Spinal stenosis, lumbar region without neurogenic claudication: Secondary | ICD-10-CM | POA: Diagnosis not present

## 2022-11-12 DIAGNOSIS — I119 Hypertensive heart disease without heart failure: Secondary | ICD-10-CM | POA: Diagnosis not present

## 2022-11-12 DIAGNOSIS — I872 Venous insufficiency (chronic) (peripheral): Secondary | ICD-10-CM | POA: Diagnosis not present

## 2022-11-12 DIAGNOSIS — I080 Rheumatic disorders of both mitral and aortic valves: Secondary | ICD-10-CM | POA: Diagnosis not present

## 2022-11-12 DIAGNOSIS — L89612 Pressure ulcer of right heel, stage 2: Secondary | ICD-10-CM | POA: Diagnosis not present

## 2022-11-12 DIAGNOSIS — L97221 Non-pressure chronic ulcer of left calf limited to breakdown of skin: Secondary | ICD-10-CM | POA: Diagnosis not present

## 2022-11-12 DIAGNOSIS — M1611 Unilateral primary osteoarthritis, right hip: Secondary | ICD-10-CM | POA: Diagnosis not present

## 2022-11-12 DIAGNOSIS — I890 Lymphedema, not elsewhere classified: Secondary | ICD-10-CM | POA: Diagnosis not present

## 2022-11-12 DIAGNOSIS — I739 Peripheral vascular disease, unspecified: Secondary | ICD-10-CM | POA: Diagnosis not present

## 2022-11-15 ENCOUNTER — Encounter: Payer: Self-pay | Admitting: Family Medicine

## 2022-11-15 DIAGNOSIS — B369 Superficial mycosis, unspecified: Secondary | ICD-10-CM | POA: Insufficient documentation

## 2022-11-15 NOTE — Assessment & Plan Note (Signed)
Chronic venous stasis requiring twice weekly pressure wraps  home health follows

## 2022-11-15 NOTE — Assessment & Plan Note (Signed)
Hyperlipidemia:Low fat diet discussed and encouraged.   Lipid Panel  Lab Results  Component Value Date   CHOL 197 04/03/2022   HDL 107 04/03/2022   LDLCALC 76 04/03/2022   TRIG 77 04/03/2022   CHOLHDL 1.8 04/03/2022

## 2022-11-15 NOTE — Assessment & Plan Note (Signed)
Home safey reviewed no falls reported

## 2022-11-15 NOTE — Assessment & Plan Note (Signed)
Maintained on eliquis 

## 2022-11-15 NOTE — Assessment & Plan Note (Signed)
Short term oral  terbinafine and topical prescribed long term as needed

## 2022-11-16 ENCOUNTER — Telehealth: Payer: Self-pay | Admitting: Family Medicine

## 2022-11-16 DIAGNOSIS — L97221 Non-pressure chronic ulcer of left calf limited to breakdown of skin: Secondary | ICD-10-CM | POA: Diagnosis not present

## 2022-11-16 DIAGNOSIS — M1611 Unilateral primary osteoarthritis, right hip: Secondary | ICD-10-CM | POA: Diagnosis not present

## 2022-11-16 DIAGNOSIS — I119 Hypertensive heart disease without heart failure: Secondary | ICD-10-CM | POA: Diagnosis not present

## 2022-11-16 DIAGNOSIS — M48061 Spinal stenosis, lumbar region without neurogenic claudication: Secondary | ICD-10-CM | POA: Diagnosis not present

## 2022-11-16 DIAGNOSIS — I080 Rheumatic disorders of both mitral and aortic valves: Secondary | ICD-10-CM | POA: Diagnosis not present

## 2022-11-16 DIAGNOSIS — I739 Peripheral vascular disease, unspecified: Secondary | ICD-10-CM | POA: Diagnosis not present

## 2022-11-16 DIAGNOSIS — I872 Venous insufficiency (chronic) (peripheral): Secondary | ICD-10-CM | POA: Diagnosis not present

## 2022-11-16 DIAGNOSIS — L89612 Pressure ulcer of right heel, stage 2: Secondary | ICD-10-CM | POA: Diagnosis not present

## 2022-11-16 DIAGNOSIS — I890 Lymphedema, not elsewhere classified: Secondary | ICD-10-CM | POA: Diagnosis not present

## 2022-11-16 NOTE — Telephone Encounter (Signed)
Tabitha w. [facility] called in on patient behalf   Pt leg is swollen and painful w slight oder  Wants to see if provider can send in antibiotic   Call back # [phone]

## 2022-11-16 NOTE — Telephone Encounter (Signed)
Pt called in regard to visit on 8/20 is unsure of why she had multiple meds sent in . Patient states that she had not discussed the need for meds . Patient wants a call back

## 2022-11-17 ENCOUNTER — Other Ambulatory Visit: Payer: Self-pay

## 2022-11-17 DIAGNOSIS — I872 Venous insufficiency (chronic) (peripheral): Secondary | ICD-10-CM

## 2022-11-17 MED ORDER — DOXYCYCLINE HYCLATE 100 MG PO TABS: 100.0000 mg | ORAL_TABLET | Freq: Two times a day (BID) | ORAL | 0 refills | Status: DC

## 2022-11-17 NOTE — Telephone Encounter (Signed)
Spoke with [name] [name] she is aware medication sent requesting a referral to a wound clinic for patient.

## 2022-11-17 NOTE — Telephone Encounter (Signed)
Order placed

## 2022-11-17 NOTE — Telephone Encounter (Signed)
Spoke with patient she is aware of medications from last visit

## 2022-11-18 DIAGNOSIS — H25812 Combined forms of age-related cataract, left eye: Secondary | ICD-10-CM | POA: Diagnosis not present

## 2022-11-19 DIAGNOSIS — I080 Rheumatic disorders of both mitral and aortic valves: Secondary | ICD-10-CM | POA: Diagnosis not present

## 2022-11-19 DIAGNOSIS — I872 Venous insufficiency (chronic) (peripheral): Secondary | ICD-10-CM | POA: Diagnosis not present

## 2022-11-19 DIAGNOSIS — I739 Peripheral vascular disease, unspecified: Secondary | ICD-10-CM | POA: Diagnosis not present

## 2022-11-19 DIAGNOSIS — L89612 Pressure ulcer of right heel, stage 2: Secondary | ICD-10-CM | POA: Diagnosis not present

## 2022-11-19 DIAGNOSIS — M48061 Spinal stenosis, lumbar region without neurogenic claudication: Secondary | ICD-10-CM | POA: Diagnosis not present

## 2022-11-19 DIAGNOSIS — I890 Lymphedema, not elsewhere classified: Secondary | ICD-10-CM | POA: Diagnosis not present

## 2022-11-19 DIAGNOSIS — I119 Hypertensive heart disease without heart failure: Secondary | ICD-10-CM | POA: Diagnosis not present

## 2022-11-19 DIAGNOSIS — L97221 Non-pressure chronic ulcer of left calf limited to breakdown of skin: Secondary | ICD-10-CM | POA: Diagnosis not present

## 2022-11-19 DIAGNOSIS — M1611 Unilateral primary osteoarthritis, right hip: Secondary | ICD-10-CM | POA: Diagnosis not present

## 2022-11-24 ENCOUNTER — Ambulatory Visit (HOSPITAL_COMMUNITY): Payer: Medicare HMO | Attending: Family Medicine | Admitting: Physical Therapy

## 2022-11-24 DIAGNOSIS — L89612 Pressure ulcer of right heel, stage 2: Secondary | ICD-10-CM | POA: Diagnosis not present

## 2022-11-24 DIAGNOSIS — I739 Peripheral vascular disease, unspecified: Secondary | ICD-10-CM | POA: Diagnosis not present

## 2022-11-24 DIAGNOSIS — M48061 Spinal stenosis, lumbar region without neurogenic claudication: Secondary | ICD-10-CM | POA: Diagnosis not present

## 2022-11-24 DIAGNOSIS — I080 Rheumatic disorders of both mitral and aortic valves: Secondary | ICD-10-CM | POA: Diagnosis not present

## 2022-11-24 DIAGNOSIS — L97221 Non-pressure chronic ulcer of left calf limited to breakdown of skin: Secondary | ICD-10-CM | POA: Diagnosis not present

## 2022-11-24 DIAGNOSIS — I890 Lymphedema, not elsewhere classified: Secondary | ICD-10-CM | POA: Diagnosis not present

## 2022-11-24 DIAGNOSIS — I119 Hypertensive heart disease without heart failure: Secondary | ICD-10-CM | POA: Diagnosis not present

## 2022-11-24 DIAGNOSIS — I872 Venous insufficiency (chronic) (peripheral): Secondary | ICD-10-CM | POA: Diagnosis not present

## 2022-11-24 DIAGNOSIS — M1611 Unilateral primary osteoarthritis, right hip: Secondary | ICD-10-CM | POA: Diagnosis not present

## 2022-11-26 ENCOUNTER — Ambulatory Visit (HOSPITAL_COMMUNITY): Payer: Medicare HMO | Admitting: Physical Therapy

## 2022-11-26 DIAGNOSIS — I080 Rheumatic disorders of both mitral and aortic valves: Secondary | ICD-10-CM | POA: Diagnosis not present

## 2022-11-26 DIAGNOSIS — I872 Venous insufficiency (chronic) (peripheral): Secondary | ICD-10-CM | POA: Diagnosis not present

## 2022-11-26 DIAGNOSIS — M48061 Spinal stenosis, lumbar region without neurogenic claudication: Secondary | ICD-10-CM | POA: Diagnosis not present

## 2022-11-26 DIAGNOSIS — L89612 Pressure ulcer of right heel, stage 2: Secondary | ICD-10-CM | POA: Diagnosis not present

## 2022-11-26 DIAGNOSIS — I890 Lymphedema, not elsewhere classified: Secondary | ICD-10-CM | POA: Diagnosis not present

## 2022-11-26 DIAGNOSIS — I119 Hypertensive heart disease without heart failure: Secondary | ICD-10-CM | POA: Diagnosis not present

## 2022-11-26 DIAGNOSIS — M1611 Unilateral primary osteoarthritis, right hip: Secondary | ICD-10-CM | POA: Diagnosis not present

## 2022-11-26 DIAGNOSIS — L97221 Non-pressure chronic ulcer of left calf limited to breakdown of skin: Secondary | ICD-10-CM | POA: Diagnosis not present

## 2022-11-26 DIAGNOSIS — I739 Peripheral vascular disease, unspecified: Secondary | ICD-10-CM | POA: Diagnosis not present

## 2022-11-27 ENCOUNTER — Telehealth: Payer: Self-pay | Admitting: Family Medicine

## 2022-11-27 DIAGNOSIS — I890 Lymphedema, not elsewhere classified: Secondary | ICD-10-CM | POA: Diagnosis not present

## 2022-11-27 DIAGNOSIS — I119 Hypertensive heart disease without heart failure: Secondary | ICD-10-CM | POA: Diagnosis not present

## 2022-11-27 DIAGNOSIS — I080 Rheumatic disorders of both mitral and aortic valves: Secondary | ICD-10-CM | POA: Diagnosis not present

## 2022-11-27 DIAGNOSIS — M069 Rheumatoid arthritis, unspecified: Secondary | ICD-10-CM | POA: Diagnosis not present

## 2022-11-27 DIAGNOSIS — L97221 Non-pressure chronic ulcer of left calf limited to breakdown of skin: Secondary | ICD-10-CM | POA: Diagnosis not present

## 2022-11-27 DIAGNOSIS — H353221 Exudative age-related macular degeneration, left eye, with active choroidal neovascularization: Secondary | ICD-10-CM | POA: Diagnosis not present

## 2022-11-27 DIAGNOSIS — M1611 Unilateral primary osteoarthritis, right hip: Secondary | ICD-10-CM | POA: Diagnosis not present

## 2022-11-27 DIAGNOSIS — I739 Peripheral vascular disease, unspecified: Secondary | ICD-10-CM | POA: Diagnosis not present

## 2022-11-27 DIAGNOSIS — M48061 Spinal stenosis, lumbar region without neurogenic claudication: Secondary | ICD-10-CM | POA: Diagnosis not present

## 2022-11-27 DIAGNOSIS — I872 Venous insufficiency (chronic) (peripheral): Secondary | ICD-10-CM | POA: Diagnosis not present

## 2022-11-27 NOTE — Telephone Encounter (Signed)
Lyrica w. [facility] called in on patient behalf  Orders for [employer]  2x weekly  Wound care / disease management  Both legs weeping  Redness   Call back info Lyrica [phone].

## 2022-11-27 NOTE — Telephone Encounter (Signed)
Gave verbal orders. 

## 2022-11-28 DIAGNOSIS — R030 Elevated blood-pressure reading, without diagnosis of hypertension: Secondary | ICD-10-CM | POA: Diagnosis not present

## 2022-11-28 DIAGNOSIS — M79641 Pain in right hand: Secondary | ICD-10-CM | POA: Diagnosis not present

## 2022-11-30 ENCOUNTER — Other Ambulatory Visit: Payer: Self-pay | Admitting: Hematology

## 2022-11-30 ENCOUNTER — Ambulatory Visit (HOSPITAL_COMMUNITY): Payer: Medicare HMO | Admitting: Physical Therapy

## 2022-11-30 DIAGNOSIS — M48061 Spinal stenosis, lumbar region without neurogenic claudication: Secondary | ICD-10-CM | POA: Diagnosis not present

## 2022-11-30 DIAGNOSIS — I080 Rheumatic disorders of both mitral and aortic valves: Secondary | ICD-10-CM | POA: Diagnosis not present

## 2022-11-30 DIAGNOSIS — I739 Peripheral vascular disease, unspecified: Secondary | ICD-10-CM | POA: Diagnosis not present

## 2022-11-30 DIAGNOSIS — M069 Rheumatoid arthritis, unspecified: Secondary | ICD-10-CM | POA: Diagnosis not present

## 2022-11-30 DIAGNOSIS — L97221 Non-pressure chronic ulcer of left calf limited to breakdown of skin: Secondary | ICD-10-CM | POA: Diagnosis not present

## 2022-11-30 DIAGNOSIS — I872 Venous insufficiency (chronic) (peripheral): Secondary | ICD-10-CM | POA: Diagnosis not present

## 2022-11-30 DIAGNOSIS — M1611 Unilateral primary osteoarthritis, right hip: Secondary | ICD-10-CM | POA: Diagnosis not present

## 2022-11-30 DIAGNOSIS — I119 Hypertensive heart disease without heart failure: Secondary | ICD-10-CM | POA: Diagnosis not present

## 2022-11-30 DIAGNOSIS — I890 Lymphedema, not elsewhere classified: Secondary | ICD-10-CM | POA: Diagnosis not present

## 2022-11-30 NOTE — Telephone Encounter (Signed)
[name] [name] with [facility] wanting to inform that her right hand has been swollen for days- does not report any falls or incidents that could have caused this.

## 2022-12-01 DIAGNOSIS — H4312 Vitreous hemorrhage, left eye: Secondary | ICD-10-CM | POA: Diagnosis not present

## 2022-12-02 NOTE — Telephone Encounter (Signed)
Patient aware.

## 2022-12-03 ENCOUNTER — Ambulatory Visit (HOSPITAL_COMMUNITY): Payer: Medicare HMO | Admitting: Physical Therapy

## 2022-12-03 DIAGNOSIS — I080 Rheumatic disorders of both mitral and aortic valves: Secondary | ICD-10-CM | POA: Diagnosis not present

## 2022-12-03 DIAGNOSIS — I872 Venous insufficiency (chronic) (peripheral): Secondary | ICD-10-CM | POA: Diagnosis not present

## 2022-12-03 DIAGNOSIS — M48061 Spinal stenosis, lumbar region without neurogenic claudication: Secondary | ICD-10-CM | POA: Diagnosis not present

## 2022-12-03 DIAGNOSIS — I119 Hypertensive heart disease without heart failure: Secondary | ICD-10-CM | POA: Diagnosis not present

## 2022-12-03 DIAGNOSIS — M1611 Unilateral primary osteoarthritis, right hip: Secondary | ICD-10-CM | POA: Diagnosis not present

## 2022-12-03 DIAGNOSIS — I739 Peripheral vascular disease, unspecified: Secondary | ICD-10-CM | POA: Diagnosis not present

## 2022-12-03 DIAGNOSIS — I890 Lymphedema, not elsewhere classified: Secondary | ICD-10-CM | POA: Diagnosis not present

## 2022-12-03 DIAGNOSIS — L97221 Non-pressure chronic ulcer of left calf limited to breakdown of skin: Secondary | ICD-10-CM | POA: Diagnosis not present

## 2022-12-03 DIAGNOSIS — M069 Rheumatoid arthritis, unspecified: Secondary | ICD-10-CM | POA: Diagnosis not present

## 2022-12-07 ENCOUNTER — Other Ambulatory Visit: Payer: Self-pay

## 2022-12-07 ENCOUNTER — Telehealth: Payer: Self-pay | Admitting: Family Medicine

## 2022-12-07 ENCOUNTER — Inpatient Hospital Stay (HOSPITAL_COMMUNITY)
Admission: EM | Admit: 2022-12-07 | Discharge: 2022-12-09 | DRG: 291 | Disposition: A | Discharge status: Home Health | Payer: Medicare HMO | Attending: Internal Medicine | Admitting: Internal Medicine

## 2022-12-07 ENCOUNTER — Encounter (HOSPITAL_COMMUNITY): Payer: Self-pay | Admitting: *Deleted

## 2022-12-07 DIAGNOSIS — I110 Hypertensive heart disease with heart failure: Principal | ICD-10-CM | POA: Diagnosis present

## 2022-12-07 DIAGNOSIS — Z880 Allergy status to penicillin: Secondary | ICD-10-CM

## 2022-12-07 DIAGNOSIS — I5033 Acute on chronic diastolic (congestive) heart failure: Secondary | ICD-10-CM | POA: Diagnosis not present

## 2022-12-07 DIAGNOSIS — I509 Heart failure, unspecified: Secondary | ICD-10-CM | POA: Diagnosis not present

## 2022-12-07 DIAGNOSIS — M1611 Unilateral primary osteoarthritis, right hip: Secondary | ICD-10-CM | POA: Diagnosis not present

## 2022-12-07 DIAGNOSIS — R0989 Other specified symptoms and signs involving the circulatory and respiratory systems: Secondary | ICD-10-CM | POA: Diagnosis not present

## 2022-12-07 DIAGNOSIS — Z7901 Long term (current) use of anticoagulants: Secondary | ICD-10-CM

## 2022-12-07 DIAGNOSIS — Z888 Allergy status to other drugs, medicaments and biological substances status: Secondary | ICD-10-CM

## 2022-12-07 DIAGNOSIS — Z8052 Family history of malignant neoplasm of bladder: Secondary | ICD-10-CM | POA: Diagnosis not present

## 2022-12-07 DIAGNOSIS — Z8719 Personal history of other diseases of the digestive system: Secondary | ICD-10-CM

## 2022-12-07 DIAGNOSIS — L97819 Non-pressure chronic ulcer of other part of right lower leg with unspecified severity: Secondary | ICD-10-CM | POA: Diagnosis present

## 2022-12-07 DIAGNOSIS — Z66 Do not resuscitate: Secondary | ICD-10-CM | POA: Diagnosis present

## 2022-12-07 DIAGNOSIS — R42 Dizziness and giddiness: Secondary | ICD-10-CM | POA: Diagnosis not present

## 2022-12-07 DIAGNOSIS — E785 Hyperlipidemia, unspecified: Secondary | ICD-10-CM | POA: Diagnosis present

## 2022-12-07 DIAGNOSIS — R0602 Shortness of breath: Secondary | ICD-10-CM | POA: Diagnosis not present

## 2022-12-07 DIAGNOSIS — R000 Tachycardia, unspecified: Secondary | ICD-10-CM | POA: Diagnosis not present

## 2022-12-07 DIAGNOSIS — Z8673 Personal history of transient ischemic attack (TIA), and cerebral infarction without residual deficits: Secondary | ICD-10-CM | POA: Diagnosis not present

## 2022-12-07 DIAGNOSIS — I5031 Acute diastolic (congestive) heart failure: Secondary | ICD-10-CM | POA: Diagnosis present

## 2022-12-07 DIAGNOSIS — I890 Lymphedema, not elsewhere classified: Secondary | ICD-10-CM | POA: Diagnosis not present

## 2022-12-07 DIAGNOSIS — Z993 Dependence on wheelchair: Secondary | ICD-10-CM

## 2022-12-07 DIAGNOSIS — Z8261 Family history of arthritis: Secondary | ICD-10-CM | POA: Diagnosis not present

## 2022-12-07 DIAGNOSIS — Z91148 Patient's other noncompliance with medication regimen for other reason: Secondary | ICD-10-CM

## 2022-12-07 DIAGNOSIS — Z8249 Family history of ischemic heart disease and other diseases of the circulatory system: Secondary | ICD-10-CM

## 2022-12-07 DIAGNOSIS — M1990 Unspecified osteoarthritis, unspecified site: Secondary | ICD-10-CM | POA: Diagnosis present

## 2022-12-07 DIAGNOSIS — Z881 Allergy status to other antibiotic agents status: Secondary | ICD-10-CM

## 2022-12-07 DIAGNOSIS — Z9049 Acquired absence of other specified parts of digestive tract: Secondary | ICD-10-CM | POA: Diagnosis not present

## 2022-12-07 DIAGNOSIS — R600 Localized edema: Secondary | ICD-10-CM | POA: Diagnosis present

## 2022-12-07 DIAGNOSIS — E7849 Other hyperlipidemia: Secondary | ICD-10-CM | POA: Diagnosis not present

## 2022-12-07 DIAGNOSIS — R531 Weakness: Secondary | ICD-10-CM | POA: Diagnosis not present

## 2022-12-07 DIAGNOSIS — E7800 Pure hypercholesterolemia, unspecified: Secondary | ICD-10-CM | POA: Diagnosis not present

## 2022-12-07 DIAGNOSIS — Z833 Family history of diabetes mellitus: Secondary | ICD-10-CM

## 2022-12-07 DIAGNOSIS — M069 Rheumatoid arthritis, unspecified: Secondary | ICD-10-CM | POA: Diagnosis not present

## 2022-12-07 DIAGNOSIS — Z885 Allergy status to narcotic agent status: Secondary | ICD-10-CM

## 2022-12-07 DIAGNOSIS — I119 Hypertensive heart disease without heart failure: Secondary | ICD-10-CM | POA: Diagnosis not present

## 2022-12-07 DIAGNOSIS — R0689 Other abnormalities of breathing: Secondary | ICD-10-CM | POA: Diagnosis not present

## 2022-12-07 DIAGNOSIS — Z7952 Long term (current) use of systemic steroids: Secondary | ICD-10-CM

## 2022-12-07 DIAGNOSIS — L97829 Non-pressure chronic ulcer of other part of left lower leg with unspecified severity: Secondary | ICD-10-CM | POA: Diagnosis present

## 2022-12-07 DIAGNOSIS — L0390 Cellulitis, unspecified: Secondary | ICD-10-CM | POA: Diagnosis not present

## 2022-12-07 DIAGNOSIS — Z8542 Personal history of malignant neoplasm of other parts of uterus: Secondary | ICD-10-CM

## 2022-12-07 DIAGNOSIS — E876 Hypokalemia: Secondary | ICD-10-CM | POA: Diagnosis present

## 2022-12-07 DIAGNOSIS — Z79899 Other long term (current) drug therapy: Secondary | ICD-10-CM

## 2022-12-07 DIAGNOSIS — G629 Polyneuropathy, unspecified: Secondary | ICD-10-CM | POA: Diagnosis present

## 2022-12-07 DIAGNOSIS — M48061 Spinal stenosis, lumbar region without neurogenic claudication: Secondary | ICD-10-CM | POA: Diagnosis not present

## 2022-12-07 DIAGNOSIS — I878 Other specified disorders of veins: Secondary | ICD-10-CM | POA: Diagnosis present

## 2022-12-07 DIAGNOSIS — L309 Dermatitis, unspecified: Secondary | ICD-10-CM | POA: Diagnosis present

## 2022-12-07 DIAGNOSIS — L8990 Pressure ulcer of unspecified site, unspecified stage: Secondary | ICD-10-CM | POA: Insufficient documentation

## 2022-12-07 DIAGNOSIS — Z86718 Personal history of other venous thrombosis and embolism: Secondary | ICD-10-CM

## 2022-12-07 DIAGNOSIS — I872 Venous insufficiency (chronic) (peripheral): Secondary | ICD-10-CM | POA: Diagnosis not present

## 2022-12-07 DIAGNOSIS — N3000 Acute cystitis without hematuria: Secondary | ICD-10-CM | POA: Diagnosis not present

## 2022-12-07 DIAGNOSIS — I739 Peripheral vascular disease, unspecified: Secondary | ICD-10-CM | POA: Diagnosis not present

## 2022-12-07 DIAGNOSIS — R55 Syncope and collapse: Secondary | ICD-10-CM | POA: Diagnosis not present

## 2022-12-07 DIAGNOSIS — Z7401 Bed confinement status: Secondary | ICD-10-CM | POA: Diagnosis not present

## 2022-12-07 DIAGNOSIS — L97221 Non-pressure chronic ulcer of left calf limited to breakdown of skin: Secondary | ICD-10-CM | POA: Diagnosis not present

## 2022-12-07 DIAGNOSIS — I080 Rheumatic disorders of both mitral and aortic valves: Secondary | ICD-10-CM | POA: Diagnosis not present

## 2022-12-07 DIAGNOSIS — I10 Essential (primary) hypertension: Secondary | ICD-10-CM | POA: Diagnosis not present

## 2022-12-07 DIAGNOSIS — Z90710 Acquired absence of both cervix and uterus: Secondary | ICD-10-CM

## 2022-12-07 DIAGNOSIS — L89152 Pressure ulcer of sacral region, stage 2: Secondary | ICD-10-CM | POA: Diagnosis not present

## 2022-12-07 LAB — CBC WITH DIFFERENTIAL/PLATELET
Abs Immature Granulocytes: 0.07 10*3/uL (ref 0.00–0.07)
Basophils Absolute: 0 10*3/uL (ref 0.0–0.1)
Basophils Relative: 0 %
Eosinophils Absolute: 0 10*3/uL (ref 0.0–0.5)
Eosinophils Relative: 0 %
HCT: 33.8 % — ABNORMAL LOW (ref 36.0–46.0)
Hemoglobin: 10.5 g/dL — ABNORMAL LOW (ref 12.0–15.0)
Immature Granulocytes: 1 %
Lymphocytes Relative: 5 %
Lymphs Abs: 0.7 10*3/uL (ref 0.7–4.0)
MCH: 30.6 pg (ref 26.0–34.0)
MCHC: 31.1 g/dL (ref 30.0–36.0)
MCV: 98.5 fL (ref 80.0–100.0)
Monocytes Absolute: 0.4 10*3/uL (ref 0.1–1.0)
Monocytes Relative: 3 %
Neutro Abs: 11.7 10*3/uL — ABNORMAL HIGH (ref 1.7–7.7)
Neutrophils Relative %: 91 %
Platelets: 312 10*3/uL (ref 150–400)
RBC: 3.43 MIL/uL — ABNORMAL LOW (ref 3.87–5.11)
RDW: 15.5 % (ref 11.5–15.5)
WBC: 12.9 10*3/uL — ABNORMAL HIGH (ref 4.0–10.5)
nRBC: 0 % (ref 0.0–0.2)

## 2022-12-07 LAB — COMPREHENSIVE METABOLIC PANEL
ALT: 23 U/L (ref 0–44)
AST: 25 U/L (ref 15–41)
Albumin: 3.2 g/dL — ABNORMAL LOW (ref 3.5–5.0)
Alkaline Phosphatase: 50 U/L (ref 38–126)
Anion gap: 11 (ref 5–15)
BUN: 22 mg/dL (ref 8–23)
CO2: 28 mmol/L (ref 22–32)
Calcium: 9.1 mg/dL (ref 8.9–10.3)
Chloride: 101 mmol/L (ref 98–111)
Creatinine, Ser: 0.8 mg/dL (ref 0.44–1.00)
GFR, Estimated: >60 mL/min (ref >60)
Glucose, Bld: 128 mg/dL — ABNORMAL HIGH (ref 70–99)
Potassium: 4.1 mmol/L (ref 3.5–5.1)
Sodium: 140 mmol/L (ref 135–145)
Total Bilirubin: 0.7 mg/dL (ref 0.3–1.2)
Total Protein: 6.5 g/dL (ref 6.5–8.1)

## 2022-12-07 LAB — BRAIN NATRIURETIC PEPTIDE: B Natriuretic Peptide: 174 pg/mL — ABNORMAL HIGH (ref 0.0–100.0)

## 2022-12-07 MED ORDER — FUROSEMIDE 10 MG/ML IJ SOLN
40.0000 mg | Freq: Once | INTRAMUSCULAR | Status: AC
  Administered 2022-12-07: 40 mg via INTRAVENOUS
  Filled 2022-12-07: qty 4

## 2022-12-07 NOTE — Telephone Encounter (Signed)
Tabitha called in on patient behalf   Patients leg is worse  Drainage is green w. Odor Pt finishes antibiotic last week   Has appt with wound care 9/30   FYI to provider   Call back info Tabitha [phone]

## 2022-12-07 NOTE — ED Triage Notes (Signed)
Pt was told to come here by her [facility] nurse due to worsening of her lower legs with cellulitis; pt had legs wrapped today

## 2022-12-07 NOTE — Telephone Encounter (Signed)
Please refer to other TE

## 2022-12-07 NOTE — Telephone Encounter (Signed)
Lina Sayre called about patient she has blisters on her legs and bleeding. Sent teams to clinical staff to pick up call. Burna Mortimer call back # [phone]  TEAMS: Important! I have wanda on my phone holding about dr Lodema Hong patient: [name] MRN [id number] has boils and blisters over legs and now bleeding. can nurse speak to her?    Important! 1:44 PMImportantEditedKnowles, Lawson Fiscal I have wanda on my phone holding about dr Lodema Hong patient: [name] [name] MRN [id number] has boils and blisters over legs and now bleeding. can nurse speak to her?

## 2022-12-07 NOTE — Telephone Encounter (Signed)
Spoke to pts sister advised for pt to go to ED to be evaluated.

## 2022-12-07 NOTE — ED Provider Notes (Signed)
[place] EMERGENCY DEPARTMENT AT [facility] Provider Note   CSN: [id number] Arrival date & time: 12/07/22  1529     History  Chief Complaint  Patient presents with   Leg Pain    [name] [name] is a 89 y.o. female with a history including hypertension, peripheral neuropathy, history of DVT, osteoarthritis, venous stasis dermatitis and history of CHF who has been under the care of Dr. Lodema Hong and home health nursing for worsening venous stasis dermatitis.  Over the past 3 weeks she has had significant worsening ulcerations in her bilateral lower legs.  She is getting compression dressings twice weekly by her home health aide, and has been advised to elevate her legs to help reduce swelling.  However this is difficult for her to elevate her legs as she also has breakdown at her coccyx and cannot sit and lie on her back for long periods of time.  She has difficulty taking her Lasix as prescribed as she has difficulty ambulating and is afraid she is going to fall having to go the bathroom frequently with this medication.  She denies fevers or chills.  She denies chest pain, shortness of breath, but does have significant pain in her bilateral lower legs.  The history is provided by the patient.       Home Medications Prior to Admission medications   Medication Sig Start Date End Date Taking? Authorizing Provider  acetaminophen (TYLENOL) 650 MG CR tablet Take 650 mg by mouth as needed for pain. 08/08/19   [provider]  cholecalciferol (VITAMIN D3) 25 MCG (1000 UNIT) tablet Take 1 tablet (1,000 Units total) by mouth daily. 11/05/22   Kerri Perches, MD  clotrimazole-betamethasone (LOTRISONE) cream Apply 1 Application topically 2 (two) times daily. 11/10/22   Kerri Perches, MD  doxycycline (VIBRA-TABS) 100 MG tablet Take 1 tablet (100 mg total) by mouth 2 (two) times daily. 11/17/22   Kerri Perches, MD  ELIQUIS 5 MG TABS tablet TAKE 1 TABLET TWICE DAILY 10/13/22    Doreatha Massed, MD  ezetimibe (ZETIA) 10 MG tablet TAKE 1 TABLET EVERY DAY 03/26/22   Kerri Perches, MD  feeding supplement, GLUCERNA SHAKE, (GLUCERNA SHAKE) LIQD Take 237 mLs by mouth 3 (three) times daily between meals. 09/24/21   Vassie Loll, MD  folic acid (FOLVITE) 1 MG tablet TAKE 1 TABLET EVERY DAY 10/23/22   Doreatha Massed, MD  furosemide (LASIX) 20 MG tablet TAKE 1 TABLET EVERY DAY 09/16/22   Kerri Perches, MD  gabapentin (NEURONTIN) 100 MG capsule TAKE 1 CAPSULE EVERY MORNING AND TAKE 2 CAPSULES AT BEDTIME 07/20/22   Kerri Perches, MD  magnesium oxide (MAG-OX) 400 (240 Mg) MG tablet TAKE 1 TABLET TWICE DAILY 11/30/22   Doreatha Massed, MD  methotrexate 2.5 MG tablet Take 5 tablets (12.5 mg total) by mouth once a week. Caution:Chemotherapy. Protect from light. 12/25/21   Glean Salvo, NP  mirabegron ER (MYRBETRIQ) 25 MG TB24 tablet Take 1 tablet (25 mg total) by mouth daily. 10/08/22   Kerri Perches, MD  montelukast (SINGULAIR) 10 MG tablet Take 1 tablet (10 mg total) by mouth at bedtime. 11/10/22   Kerri Perches, MD  prednisoLONE acetate (PRED FORTE) 1 % ophthalmic suspension Place 1 drop into the left eye 3 (three) times daily. 03/25/22   [provider]  predniSONE (DELTASONE) 5 MG tablet TAKE 2 TABLETS EVERY DAY WITH BREAKFAST 10/14/22   Glean Salvo, NP  terbinafine (LAMISIL) 250  MG tablet Take 1 tablet (250 mg total) by mouth daily. 11/10/22   Kerri Perches, MD      Allergies    Cellcept [mycophenolate mofetil], Statins, Ciprofloxacin hcl, Hydrocodone, and Penicillins    Review of Systems   Review of Systems  Constitutional:  Negative for fever.  HENT: Negative.    Eyes: Negative.   Respiratory:  Negative for chest tightness, shortness of breath and wheezing.   Cardiovascular:  Positive for leg swelling. Negative for chest pain.  Gastrointestinal:  Negative for abdominal pain and nausea.  Genitourinary: Negative.    Musculoskeletal:  Negative for arthralgias, joint swelling and neck pain.  Skin:  Positive for color change and wound. Negative for rash.  Neurological:  Negative for dizziness, weakness, light-headedness, numbness and headaches.  Psychiatric/Behavioral: Negative.      Physical Exam Updated Vital Signs BP (!) 178/88 (BP Location: Right Arm)   Pulse 78   Temp 98.2 F (36.8 C) (Oral)   Resp 18   Ht 5\' 6"  (1.676 m)   Wt 73.9 kg   SpO2 99%   BMI 26.31 kg/m  Physical Exam Vitals and nursing note reviewed.  Constitutional:      Appearance: She is well-developed.  HENT:     Head: Normocephalic and atraumatic.  Eyes:     Conjunctiva/sclera: Conjunctivae normal.  Cardiovascular:     Rate and Rhythm: Normal rate and regular rhythm.     Heart sounds: Normal heart sounds.  Pulmonary:     Effort: Pulmonary effort is normal. No respiratory distress.     Breath sounds: Normal breath sounds. No wheezing or rales.  Abdominal:     General: Bowel sounds are normal.     Palpations: Abdomen is soft.     Tenderness: There is no abdominal tenderness.  Musculoskeletal:        General: Normal range of motion.     Cervical back: Normal range of motion.     Comments: Pt presents with compression dressings bilateral lower legs, sig edema above the dressings.  Venous stasis ulcerations bilateral lower legs, subcutaneous, white medicated cream present.  No spreading erythema or cellulitis appreciated.  Skin:    General: Skin is warm and dry.  Neurological:     Mental Status: She is alert.     ED Results / Procedures / Treatments   Labs (all labs ordered are listed, but only abnormal results are displayed) Labs Reviewed  COMPREHENSIVE METABOLIC PANEL - Abnormal; Notable for the following components:      Result Value   Glucose, Bld 128 (*)    Albumin 3.2 (*)    All other components within normal limits  CBC WITH DIFFERENTIAL/PLATELET - Abnormal; Notable for the following components:   WBC  12.9 (*)    RBC 3.43 (*)    Hemoglobin 10.5 (*)    HCT 33.8 (*)    Neutro Abs 11.7 (*)    All other components within normal limits  BRAIN NATRIURETIC PEPTIDE - Abnormal; Notable for the following components:   B Natriuretic Peptide 174.0 (*)    All other components within normal limits    EKG None  Radiology No results found.  Procedures Procedures    Medications Ordered in ED Medications  furosemide (LASIX) injection 40 mg (has no administration in time range)    ED Course/ Medical Decision Making/ A&P  Medical Decision Making Patient presenting with worsening venous stasis lesions bilateral lower legs, significant progression in 3 weeks time per photos patient's daughter has documented on her phone.  She has been unable to tolerate ambulation secondary to pain and has concerns about getting to the restroom on time and therefore has been difficult for her to tolerate her Lasix medication.  She has failed outpatient diuresis, she denies shortness of breath, her lungs are clear, I suspect her fluid retention is isolated to her lower extremities.  She would benefit from IV diuresis with use of a pure wick while she is diuresing.  Discussed with hospitalist.  Amount and/or Complexity of Data Reviewed Labs: ordered.    Details: C-Met, CBC reviewed, she does have a WBC count of 12.9, her hemoglobin is 10.5 which is stable.  She has an elevated BNP at 174, prior BNP 59, although this was 3 years ago. Discussion of management or test interpretation with external provider(s): Dr. Carren Rang accepts pt for admission.  Risk Decision regarding hospitalization.           Final Clinical Impression(s) / ED Diagnoses Final diagnoses:  Venous stasis dermatitis  Peripheral edema    Rx / DC Orders ED Discharge Orders     None         Victoriano Lain 12/07/22 2344    Kommor, Wyn Forster, MD 12/08/22 1128

## 2022-12-07 NOTE — ED Notes (Signed)
Assisted pt with instillation of home eye drops to her left eye.

## 2022-12-08 ENCOUNTER — Other Ambulatory Visit (HOSPITAL_COMMUNITY): Payer: Self-pay | Admitting: *Deleted

## 2022-12-08 ENCOUNTER — Telehealth: Payer: Self-pay | Admitting: Family Medicine

## 2022-12-08 ENCOUNTER — Observation Stay (HOSPITAL_COMMUNITY): Payer: Medicare HMO

## 2022-12-08 ENCOUNTER — Ambulatory Visit (HOSPITAL_COMMUNITY): Payer: Medicare HMO | Admitting: Physical Therapy

## 2022-12-08 DIAGNOSIS — Z66 Do not resuscitate: Secondary | ICD-10-CM | POA: Diagnosis present

## 2022-12-08 DIAGNOSIS — L8990 Pressure ulcer of unspecified site, unspecified stage: Secondary | ICD-10-CM | POA: Insufficient documentation

## 2022-12-08 DIAGNOSIS — L309 Dermatitis, unspecified: Secondary | ICD-10-CM | POA: Diagnosis present

## 2022-12-08 DIAGNOSIS — I5033 Acute on chronic diastolic (congestive) heart failure: Secondary | ICD-10-CM

## 2022-12-08 DIAGNOSIS — I878 Other specified disorders of veins: Secondary | ICD-10-CM | POA: Diagnosis present

## 2022-12-08 DIAGNOSIS — E7800 Pure hypercholesterolemia, unspecified: Secondary | ICD-10-CM | POA: Diagnosis present

## 2022-12-08 DIAGNOSIS — Z9049 Acquired absence of other specified parts of digestive tract: Secondary | ICD-10-CM | POA: Diagnosis not present

## 2022-12-08 DIAGNOSIS — I110 Hypertensive heart disease with heart failure: Secondary | ICD-10-CM | POA: Diagnosis present

## 2022-12-08 DIAGNOSIS — E7849 Other hyperlipidemia: Secondary | ICD-10-CM

## 2022-12-08 DIAGNOSIS — Z86718 Personal history of other venous thrombosis and embolism: Secondary | ICD-10-CM

## 2022-12-08 DIAGNOSIS — I509 Heart failure, unspecified: Secondary | ICD-10-CM

## 2022-12-08 DIAGNOSIS — Z8719 Personal history of other diseases of the digestive system: Secondary | ICD-10-CM | POA: Diagnosis not present

## 2022-12-08 DIAGNOSIS — Z8673 Personal history of transient ischemic attack (TIA), and cerebral infarction without residual deficits: Secondary | ICD-10-CM | POA: Diagnosis not present

## 2022-12-08 DIAGNOSIS — G629 Polyneuropathy, unspecified: Secondary | ICD-10-CM | POA: Diagnosis present

## 2022-12-08 DIAGNOSIS — Z8261 Family history of arthritis: Secondary | ICD-10-CM | POA: Diagnosis not present

## 2022-12-08 DIAGNOSIS — Z8249 Family history of ischemic heart disease and other diseases of the circulatory system: Secondary | ICD-10-CM | POA: Diagnosis not present

## 2022-12-08 DIAGNOSIS — L89152 Pressure ulcer of sacral region, stage 2: Secondary | ICD-10-CM | POA: Diagnosis present

## 2022-12-08 DIAGNOSIS — Z993 Dependence on wheelchair: Secondary | ICD-10-CM | POA: Diagnosis not present

## 2022-12-08 DIAGNOSIS — Z833 Family history of diabetes mellitus: Secondary | ICD-10-CM | POA: Diagnosis not present

## 2022-12-08 DIAGNOSIS — E876 Hypokalemia: Secondary | ICD-10-CM | POA: Diagnosis present

## 2022-12-08 DIAGNOSIS — I872 Venous insufficiency (chronic) (peripheral): Secondary | ICD-10-CM | POA: Diagnosis present

## 2022-12-08 DIAGNOSIS — M1990 Unspecified osteoarthritis, unspecified site: Secondary | ICD-10-CM | POA: Diagnosis present

## 2022-12-08 DIAGNOSIS — L97829 Non-pressure chronic ulcer of other part of left lower leg with unspecified severity: Secondary | ICD-10-CM | POA: Diagnosis present

## 2022-12-08 DIAGNOSIS — Z8542 Personal history of malignant neoplasm of other parts of uterus: Secondary | ICD-10-CM | POA: Diagnosis not present

## 2022-12-08 DIAGNOSIS — R600 Localized edema: Secondary | ICD-10-CM | POA: Diagnosis present

## 2022-12-08 DIAGNOSIS — L97819 Non-pressure chronic ulcer of other part of right lower leg with unspecified severity: Secondary | ICD-10-CM | POA: Diagnosis present

## 2022-12-08 DIAGNOSIS — Z8052 Family history of malignant neoplasm of bladder: Secondary | ICD-10-CM | POA: Diagnosis not present

## 2022-12-08 DIAGNOSIS — Z7901 Long term (current) use of anticoagulants: Secondary | ICD-10-CM | POA: Diagnosis not present

## 2022-12-08 DIAGNOSIS — I5031 Acute diastolic (congestive) heart failure: Secondary | ICD-10-CM | POA: Diagnosis present

## 2022-12-08 LAB — CBC WITH DIFFERENTIAL/PLATELET
Abs Immature Granulocytes: 0.07 10*3/uL (ref 0.00–0.07)
Basophils Absolute: 0 10*3/uL (ref 0.0–0.1)
Basophils Relative: 0 %
Eosinophils Absolute: 0.1 10*3/uL (ref 0.0–0.5)
Eosinophils Relative: 1 %
HCT: 34.4 % — ABNORMAL LOW (ref 36.0–46.0)
Hemoglobin: 10.6 g/dL — ABNORMAL LOW (ref 12.0–15.0)
Immature Granulocytes: 1 %
Lymphocytes Relative: 9 %
Lymphs Abs: 0.9 10*3/uL (ref 0.7–4.0)
MCH: 30.1 pg (ref 26.0–34.0)
MCHC: 30.8 g/dL (ref 30.0–36.0)
MCV: 97.7 fL (ref 80.0–100.0)
Monocytes Absolute: 0.7 10*3/uL (ref 0.1–1.0)
Monocytes Relative: 7 %
Neutro Abs: 8.5 10*3/uL — ABNORMAL HIGH (ref 1.7–7.7)
Neutrophils Relative %: 82 %
Platelets: 291 10*3/uL (ref 150–400)
RBC: 3.52 MIL/uL — ABNORMAL LOW (ref 3.87–5.11)
RDW: 15.2 % (ref 11.5–15.5)
WBC: 10.3 10*3/uL (ref 4.0–10.5)
nRBC: 0 % (ref 0.0–0.2)

## 2022-12-08 LAB — COMPREHENSIVE METABOLIC PANEL
ALT: 20 U/L (ref 0–44)
AST: 23 U/L (ref 15–41)
Albumin: 3 g/dL — ABNORMAL LOW (ref 3.5–5.0)
Alkaline Phosphatase: 47 U/L (ref 38–126)
Anion gap: 14 (ref 5–15)
BUN: 20 mg/dL (ref 8–23)
CO2: 29 mmol/L (ref 22–32)
Calcium: 8.9 mg/dL (ref 8.9–10.3)
Chloride: 98 mmol/L (ref 98–111)
Creatinine, Ser: 0.75 mg/dL (ref 0.44–1.00)
GFR, Estimated: >60 mL/min (ref >60)
Glucose, Bld: 94 mg/dL (ref 70–99)
Potassium: 3.2 mmol/L — ABNORMAL LOW (ref 3.5–5.1)
Sodium: 141 mmol/L (ref 135–145)
Total Bilirubin: 0.7 mg/dL (ref 0.3–1.2)
Total Protein: 6.3 g/dL — ABNORMAL LOW (ref 6.5–8.1)

## 2022-12-08 LAB — CBC
HCT: 32.7 % — ABNORMAL LOW (ref 36.0–46.0)
Hemoglobin: 10 g/dL — ABNORMAL LOW (ref 12.0–15.0)
MCH: 29.8 pg (ref 26.0–34.0)
MCHC: 30.6 g/dL (ref 30.0–36.0)
MCV: 97.3 fL (ref 80.0–100.0)
Platelets: 277 10*3/uL (ref 150–400)
RBC: 3.36 MIL/uL — ABNORMAL LOW (ref 3.87–5.11)
RDW: 15.3 % (ref 11.5–15.5)
WBC: 8.9 10*3/uL (ref 4.0–10.5)
nRBC: 0 % (ref 0.0–0.2)

## 2022-12-08 LAB — ECHOCARDIOGRAM COMPLETE
AR max vel: 1.29 cm2
AV Area VTI: 1.25 cm2
AV Area mean vel: 1.2 cm2
AV Mean grad: 11.5 mmHg
AV Peak grad: 21 mmHg
Ao pk vel: 2.29 m/s
Area-P 1/2: 3.74 cm2
Est EF: 50
Height: 66 in
MV VTI: 2.81 cm2
S' Lateral: 2.6 cm
Weight: 2317.48 [oz_av]

## 2022-12-08 LAB — TSH: TSH: 3.929 u[IU]/mL (ref 0.350–4.500)

## 2022-12-08 LAB — PROCALCITONIN: Procalcitonin: <0.1 ng/mL

## 2022-12-08 LAB — MAGNESIUM: Magnesium: 1.9 mg/dL (ref 1.7–2.4)

## 2022-12-08 MED ORDER — MIRABEGRON ER 25 MG PO TB24
25.0000 mg | ORAL_TABLET | Freq: Every day | ORAL | Status: DC
  Administered 2022-12-08 – 2022-12-09 (×2): 25 mg via ORAL
  Filled 2022-12-08 (×2): qty 1

## 2022-12-08 MED ORDER — GUAIFENESIN 100 MG/5ML PO LIQD: 5.0000 mL | ORAL | Status: DC | PRN

## 2022-12-08 MED ORDER — FUROSEMIDE 10 MG/ML IJ SOLN
40.0000 mg | Freq: Every day | INTRAMUSCULAR | Status: DC
  Administered 2022-12-08 – 2022-12-09 (×2): 40 mg via INTRAVENOUS
  Filled 2022-12-08 (×2): qty 4

## 2022-12-08 MED ORDER — ACETAMINOPHEN 650 MG RE SUPP: 650.0000 mg | Freq: Four times a day (QID) | RECTAL | Status: DC | PRN

## 2022-12-08 MED ORDER — GABAPENTIN 100 MG PO CAPS
100.0000 mg | ORAL_CAPSULE | Freq: Every day | ORAL | Status: DC
  Administered 2022-12-08: 100 mg via ORAL
  Filled 2022-12-08: qty 1

## 2022-12-08 MED ORDER — SENNOSIDES-DOCUSATE SODIUM 8.6-50 MG PO TABS: 1.0000 | ORAL_TABLET | Freq: Every evening | ORAL | Status: DC | PRN

## 2022-12-08 MED ORDER — OXYCODONE HCL 5 MG PO TABS
5.0000 mg | ORAL_TABLET | ORAL | Status: DC | PRN
  Administered 2022-12-08: 5 mg via ORAL
  Filled 2022-12-08: qty 1

## 2022-12-08 MED ORDER — HYDRALAZINE HCL 20 MG/ML IJ SOLN: 10.0000 mg | INTRAMUSCULAR | Status: DC | PRN

## 2022-12-08 MED ORDER — ONDANSETRON HCL 4 MG PO TABS: 4.0000 mg | ORAL_TABLET | Freq: Four times a day (QID) | ORAL | Status: DC | PRN

## 2022-12-08 MED ORDER — PREDNISOLONE ACETATE 1 % OP SUSP
1.0000 [drp] | Freq: Three times a day (TID) | OPHTHALMIC | Status: DC
  Administered 2022-12-08 – 2022-12-09 (×3): 1 [drp] via OPHTHALMIC
  Filled 2022-12-08: qty 5

## 2022-12-08 MED ORDER — MONTELUKAST SODIUM 10 MG PO TABS
10.0000 mg | ORAL_TABLET | Freq: Every day | ORAL | Status: DC
  Administered 2022-12-08: 10 mg via ORAL
  Filled 2022-12-08: qty 1

## 2022-12-08 MED ORDER — ACETAMINOPHEN 325 MG PO TABS: 650.0000 mg | ORAL_TABLET | Freq: Four times a day (QID) | ORAL | Status: DC | PRN

## 2022-12-08 MED ORDER — POTASSIUM CHLORIDE CRYS ER 20 MEQ PO TBCR
40.0000 meq | EXTENDED_RELEASE_TABLET | ORAL | Status: AC
  Administered 2022-12-08 (×3): 40 meq via ORAL
  Filled 2022-12-08 (×3): qty 2

## 2022-12-08 MED ORDER — ONDANSETRON HCL 4 MG/2ML IJ SOLN: 4.0000 mg | Freq: Four times a day (QID) | INTRAMUSCULAR | Status: DC | PRN

## 2022-12-08 MED ORDER — OFLOXACIN 0.3 % OP SOLN
1.0000 [drp] | Freq: Four times a day (QID) | OPHTHALMIC | Status: DC
  Administered 2022-12-08 – 2022-12-09 (×4): 1 [drp] via OPHTHALMIC
  Filled 2022-12-08: qty 5

## 2022-12-08 MED ORDER — IPRATROPIUM-ALBUTEROL 0.5-2.5 (3) MG/3ML IN SOLN: 3.0000 mL | RESPIRATORY_TRACT | Status: DC | PRN

## 2022-12-08 MED ORDER — TRAZODONE HCL 50 MG PO TABS
50.0000 mg | ORAL_TABLET | Freq: Every evening | ORAL | Status: DC | PRN
  Administered 2022-12-08: 50 mg via ORAL
  Filled 2022-12-08: qty 1

## 2022-12-08 MED ORDER — APIXABAN 5 MG PO TABS
5.0000 mg | ORAL_TABLET | Freq: Two times a day (BID) | ORAL | Status: DC
  Administered 2022-12-08 – 2022-12-09 (×4): 5 mg via ORAL
  Filled 2022-12-08 (×4): qty 1

## 2022-12-08 NOTE — Evaluation (Signed)
Occupational Therapy Evaluation Patient Details Name: [name] [name] MRN: [id number] DOB: 1932-06-30 Today's Date: 12/08/2022   History of Present Illness [name] [name] is a 89 y.o. female with medical history significant of TIA, hypertension, hyperlipidemia with statins being contraindicated, and more presents the ED with a chief complaint of bilateral lower extremity edema.   Clinical Impression   Pt agreeable to OT/PT evaluation, family in room. Pt is at baseline with ADL completion. Has an aide to assist M-F for approximately 2 hours. Family assists as well. Pt slides between surfaces at home. Can complete tasks in sitting independently. No further OT services required at this time.        If plan is discharge home, recommend the following: A little help with walking and/or transfers;A little help with bathing/dressing/bathroom;Assistance with cooking/housework;Help with stairs or ramp for entrance;Assist for transportation    Functional Status Assessment  Patient has had a recent decline in their functional status and demonstrates the ability to make significant improvements in function in a reasonable and predictable amount of time.  Equipment Recommendations  None recommended by OT       Precautions / Restrictions Precautions Precautions: Fall Restrictions Weight Bearing Restrictions: No      Mobility Bed Mobility               General bed mobility comments: Defer to PT note    Transfers                   General transfer comment: Defer to PT note          ADL either performed or assessed with clinical judgement   ADL Overall ADL's : Needs assistance/impaired;At baseline                                       General ADL Comments: Pt requiring assistance with ADLs at baseline. Can complete tasks while seated in chair, cannot stand for tasks at baseline     Vision   Vision Assessment?: No apparent visual deficits             Pertinent Vitals/Pain Pain Assessment Pain Assessment: Faces Faces Pain Scale: Hurts little more Pain Location: BLE Pain Descriptors / Indicators: Grimacing, Guarding Pain Intervention(s): Limited activity within patient's tolerance, Monitored during session, Repositioned     Extremity/Trunk Assessment Upper Extremity Assessment Upper Extremity Assessment: Generalized weakness;RUE deficits/detail RUE Deficits / Details: Pt reportedly with dupuytren's contracture in right hand. OT notes difficulty with extending digits and gripping chair arm during transfer   Lower Extremity Assessment Lower Extremity Assessment: Defer to PT evaluation       Communication Communication Communication: No apparent difficulties   Cognition Arousal: Alert Behavior During Therapy: WFL for tasks assessed/performed Overall Cognitive Status: Within Functional Limits for tasks assessed                                                  Home Living Family/patient expects to be discharged to:: Private residence Living Arrangements: Alone Available Help at Discharge: Family;Available PRN/intermittently;Personal care attendant Type of Home: House Home Access: Stairs to enter [employer] of Steps: 5 Entrance Stairs-Rails: Right;Left Home Layout: One level     Bathroom Shower/Tub: Tub/shower unit;Sponge bathes at baseline   [employer]  Toilet: Standard     Home Equipment: Agricultural consultant (2 wheels);Grab bars - tub/shower;Shower seat;BSC/3in1;Wheelchair - manual          Prior Functioning/Environment Prior Level of Function : Needs assist             Mobility Comments: Modified independent for transfers scooting over from wheelchair to bed ADLs Comments: Patient needs assist with ADL's with help from home aides who come for a couple of hours a day M-F. Performs sliding transfers between surfaces        OT Problem List: Decreased activity tolerance                 Co-evaluation PT/OT/SLP Co-Evaluation/Treatment: Yes Reason for Co-Treatment: Complexity of the patient's impairments (multi-system involvement)   OT goals addressed during session: ADL's and self-care      AM-PAC OT "6 Clicks" Daily Activity     Outcome Measure Help from another person eating meals?: None Help from another person taking care of personal grooming?: A Little Help from another person toileting, which includes using toliet, bedpan, or urinal?: A Little Help from another person bathing (including washing, rinsing, drying)?: A Lot Help from another person to put on and taking off regular upper body clothing?: A Little Help from another person to put on and taking off regular lower body clothing?: A Lot 6 Click Score: 17   End of Session    Activity Tolerance: Patient tolerated treatment well Patient left: in chair;with call bell/phone within reach;with chair alarm set;with family/visitor present  OT Visit Diagnosis: Muscle weakness (generalized) (M62.81)                Time: 3382-5053 OT Time Calculation (min): 19 min Charges:  OT General Charges $OT Visit: 1 Visit OT Evaluation $OT Eval Low Complexity: 1 Low  Ezra Sites, OTR/L  [phone] 12/08/2022, 11:10 AM

## 2022-12-08 NOTE — Plan of Care (Signed)
  Problem: Acute Rehab PT Goals(only PT should resolve) Goal: Pt will Roll Supine to Side 12/08/2022 1343 by Ocie Bob, PT Outcome: Progressing Flowsheets (Taken 12/08/2022 1343) Pt will Roll Supine to Side: with supervision 12/08/2022 1343 by Ocie Bob, PT Outcome: Progressing Goal: Pt Will Go Supine/Side To Sit 12/08/2022 1343 by Ocie Bob, PT Outcome: Progressing Flowsheets (Taken 12/08/2022 1343) Pt will go Supine/Side to Sit: with contact guard assist 12/08/2022 1343 by Ocie Bob, PT Outcome: Progressing Goal: Pt Will Go Sit To Supine/Side 12/08/2022 1343 by Ocie Bob, PT Outcome: Progressing Flowsheets (Taken 12/08/2022 1343) Pt will go Sit to Supine/Side: with supervision 12/08/2022 1343 by Ocie Bob, PT Outcome: Progressing Goal: Patient Will Transfer Sit To/From Stand 12/08/2022 1343 by Ocie Bob, PT Outcome: Progressing Flowsheets (Taken 12/08/2022 1343) Patient will transfer sit to/from stand: with moderate assist 12/08/2022 1343 by Ocie Bob, PT Outcome: Progressing Goal: Pt Will Transfer Bed To Chair/Chair To Bed 12/08/2022 1343 by Ocie Bob, PT Outcome: Progressing Flowsheets (Taken 12/08/2022 1343) Pt will Transfer Bed to Chair/Chair to Bed:  with supervision  with contact guard assist 12/08/2022 1343 by Ocie Bob, PT Outcome: Progressing   1:44 PM, 12/08/22 Ocie Bob, MPT Physical Therapist with [facility] [phone] office [phone] mobile phone

## 2022-12-08 NOTE — Telephone Encounter (Signed)
Verbal order given

## 2022-12-08 NOTE — Assessment & Plan Note (Signed)
-   Continue Eliquis 

## 2022-12-08 NOTE — Progress Notes (Signed)
*  PRELIMINARY RESULTS* Echocardiogram 2D Echocardiogram has been performed.  Carolyne Fiscal 12/08/2022, 2:17 PM

## 2022-12-08 NOTE — Plan of Care (Signed)
  Problem: Education: Goal: Knowledge of General Education information will improve Description Including pain rating scale, medication(s)/side effects and non-pharmacologic comfort measures Outcome: Progressing   Problem: Health Behavior/Discharge Planning: Goal: Ability to manage health-related needs will improve Outcome: Progressing   

## 2022-12-08 NOTE — Telephone Encounter (Signed)
Tabitha called from [place] home [facility]. Increase visit to 3 times week due to more draining. Changed wound order to use polymem.  A lot of pain in legs when does wound care, recommend lidocaine due to a lot of pain in legs   Asked for call back [phone].

## 2022-12-08 NOTE — H&P (Signed)
History and Physical    Patient: [name] [name]:[id number] DOB: 12-Sep-1932 DOA: 12/07/2022 DOS: the patient was seen and examined on 12/08/2022 PCP: Kerri Perches, MD  Patient coming from: Home  Chief Complaint:  Chief Complaint  Patient presents with   Leg Pain   HPI: [name] is a 89 y.o. female with medical history significant of TIA, hypertension, hyperlipidemia with statins being contraindicated, and more presents the ED with a chief complaint of bilateral lower extremity edema.  Patient is a very poor historian.  Anytime you try to ask her about why she is here today she talks about why she came in in January.  It is quite confusing because it would seem like you getting an answer about today but then when you specify it from January.  Patient reports she started having cellulitis in January.  When he got out she went to the hospital and she was admitted for a few days.  She was discharged to [facility] rehab.  She reports the weeping and blisters on her legs were resolved after pelvic and rehab.  At some point then she started having bilateral lower edema again.  She reports blisters and oozing again.  This part of the timeline could not be clarified.  She reports the last time she had sores were several weeks ago and that there is no weeping now.  There is clearly weeping now on exam so this is an accurate.  Patient reports she does not take her fluid pill at home as it is prescribed.  She does not like to get up to try to go void all the time.  Patient is wheelchair-bound and she reports is just too much work to go back and forth to the bathroom.  Her last dose was last week.  Patient denies any dyspnea, orthopnea, chest pain.  Patient is on Eliquis for history of DVT.  Patient has no other complaints at this time.  Patient does not smoke and does not drink.  Initially patient says she wants to be DNR, but then she says if there is any chance of bringing her back she wants to be full  code.  So, from her point of view if the provider believes that she has a chance of survival she wants to be full code. Review of Systems: As mentioned in the history of present illness. All other systems reviewed and are negative. Past Medical History:  Diagnosis Date   Acute deep vein thrombosis (DVT) of left femoral vein (HCC) 02/07/2019   Dx 02/05/2019   Arthritis    spinal stenosis   Deep vein thrombosis (DVT) of left lower extremity (HCC) 08/04/2019   Elevated LFTs    secondary to fatty liver, negative work-up in 2011   Hypercholesterolemia    Hypertension    Lumbar spinal stenosis 01/14/2018   L3-4 and L4-5   Myopathy 02/15/2018   Osteoarthritis    right knee   Peripheral neuropathy 07/06/2018   PONV (postoperative nausea and vomiting)    Uterine cancer (HCC) 08/2006   grade 1, no recurrence up to 2013   Past Surgical History:  Procedure Laterality Date   ABDOMINAL HYSTERECTOMY  2008   adenocarcinoma stage 1   APPENDECTOMY  1973   BREAST BIOPSY Left 2018   Benign   CHOLECYSTECTOMY  1973   COLONOSCOPY    06/21/2007   [id number] rectum/Sigmoid diverticula, diminutive hepatic flexure polyp s/p bx. Benign.    COLONOSCOPY N/A 10/17/2012   Dr. Darrick Penna-  moderate diverticulosis was noted in the sigmoid colon, moderate sized internal hemorrhoids. next tcs in10 years   ESOPHAGOGASTRODUODENOSCOPY N/A 03/20/2016   Dr. Darrick Penna, widely patent Schatzki ring, anemia due to ASA induced erosive gastritis, mild duodenitis. gastric bx benign without H.pylori.    FOOT SURGERY  2007   Pins in toes on left foot, 5 to 6 yrs ago   GIVENS CAPSULE STUDY N/A 03/20/2016   Procedure: GIVENS CAPSULE STUDY;  Surgeon: West Bali, MD;  [facility];  Service: Endoscopy;  Laterality: N/A;   MUSCLE BIOPSY Left 06/07/2018   Procedure: LEFT THIGH MUSCLE BIOPSY;  Surgeon: Harriette Bouillon, MD;  Location: [facility];  Service: General;  Laterality: Left;   TONSILLECTOMY     Social  History:  reports that she has never smoked. She has never used smokeless tobacco. She reports that she does not drink alcohol and does not use drugs.  Allergies  Allergen Reactions   Cellcept [Mycophenolate Mofetil]     Muscle cramps, mouth soreness, difficulty swallowing, bruising   Statins     Dx of in   Ciprofloxacin Hcl Other (See Comments)    Chills, sick, could not tolerate it   Hydrocodone Nausea Only   Penicillins Rash    Family History  Problem Relation Age of Onset   Heart disease Father    Bladder Cancer Brother        in remission    Hypertension Sister    Dementia Sister    Diabetes type II Sister    Hypertension Sister    Aneurysm Sister    Hypertension Brother    Arthritis Brother    Hypertension Son    Colon cancer Neg Hx     Prior to Admission medications   Medication Sig Start Date End Date Taking? Authorizing Provider  ofloxacin (OCUFLOX) 0.3 % ophthalmic solution Place 1 drop into the left eye 4 (four) times daily. 11/27/22  Yes [provider]  acetaminophen (TYLENOL) 650 MG CR tablet Take 650 mg by mouth as needed for pain. 08/08/19   [provider]  cholecalciferol (VITAMIN D3) 25 MCG (1000 UNIT) tablet Take 1 tablet (1,000 Units total) by mouth daily. 11/05/22   Kerri Perches, MD  clotrimazole-betamethasone (LOTRISONE) cream Apply 1 Application topically 2 (two) times daily. 11/10/22   Kerri Perches, MD  doxycycline (VIBRA-TABS) 100 MG tablet Take 1 tablet (100 mg total) by mouth 2 (two) times daily. 11/17/22   Kerri Perches, MD  ELIQUIS 5 MG TABS tablet TAKE 1 TABLET TWICE DAILY 10/13/22   Doreatha Massed, MD  ezetimibe (ZETIA) 10 MG tablet TAKE 1 TABLET EVERY DAY 03/26/22   Kerri Perches, MD  feeding supplement, GLUCERNA SHAKE, (GLUCERNA SHAKE) LIQD Take 237 mLs by mouth 3 (three) times daily between meals. 09/24/21   Vassie Loll, MD  folic acid (FOLVITE) 1 MG tablet TAKE 1 TABLET EVERY DAY 10/23/22   Doreatha Massed, MD  furosemide (LASIX) 20 MG tablet TAKE 1 TABLET EVERY DAY 09/16/22   Kerri Perches, MD  gabapentin (NEURONTIN) 100 MG capsule TAKE 1 CAPSULE EVERY MORNING AND TAKE 2 CAPSULES AT BEDTIME 07/20/22   Kerri Perches, MD  magnesium oxide (MAG-OX) 400 (240 Mg) MG tablet TAKE 1 TABLET TWICE DAILY 11/30/22   Doreatha Massed, MD  methotrexate 2.5 MG tablet Take 5 tablets (12.5 mg total) by mouth once a week. Caution:Chemotherapy. Protect from light. 12/25/21   Glean Salvo, NP  mirabegron ER (  MYRBETRIQ) 25 MG TB24 tablet Take 1 tablet (25 mg total) by mouth daily. 10/08/22   Kerri Perches, MD  montelukast (SINGULAIR) 10 MG tablet Take 1 tablet (10 mg total) by mouth at bedtime. 11/10/22   Kerri Perches, MD  prednisoLONE acetate (PRED FORTE) 1 % ophthalmic suspension Place 1 drop into the left eye 3 (three) times daily. 03/25/22   [provider]  predniSONE (DELTASONE) 5 MG tablet TAKE 2 TABLETS EVERY DAY WITH BREAKFAST 10/14/22   Glean Salvo, NP  terbinafine (LAMISIL) 250 MG tablet Take 1 tablet (250 mg total) by mouth daily. 11/10/22   Kerri Perches, MD    Physical Exam: Vitals:   12/08/22 0015 12/08/22 0053 12/08/22 0139 12/08/22 0510  BP: (!) 178/82 (!) 205/89 (!) 158/98 (!) 154/64  Pulse: 83 87  79  Resp: 17 20  18   Temp: 98 F (36.7 C) 97.9 F (36.6 C)  98.1 F (36.7 C)  TempSrc: Oral Oral    SpO2: 100% 98%  100%  Weight:  66.9 kg  65.7 kg  Height:       1.  General: Patient lying supine in bed,  no acute distress   2. Psychiatric: Alert and oriented x 3, mood and behavior normal for situation, pleasant and cooperative with exam   3. Neurologic: Speech and language are normal, face is symmetric, moves all 4 extremities voluntarily, at baseline without acute deficits on limited exam   4. HEENMT:  Head is atraumatic, normocephalic, pupils reactive to light, neck is supple, trachea is midline, mucous membranes are moist   5.  Respiratory : Lungs are clear to auscultation bilaterally without wheezing, rhonchi, rales, no cyanosis, no increase in work of breathing or accessory muscle use   6. Cardiovascular : Heart rate normal, rhythm is regular, murmur present, rubs or gallops, no peripheral edema, peripheral pulses palpated   7. Gastrointestinal:  Abdomen is soft, nondistended, nontender to palpation bowel sounds active, no masses or organomegaly palpated   8. Skin:  Lower extremities show evidence of diuresis with wrinkles in the skin, legs have blisters that have ruptured and are peeling off, clear fluid draining   9.Musculoskeletal:  No acute deformities or trauma, no asymmetry in tone, no peripheral edema, peripheral pulses palpated, no tenderness to palpation in the extremities  Data Reviewed: In the ED Temp 97.8-98.2, heart rate 78-83, respiratory rate 18, blood pressure 178/88-183/90, satting 99 Current blood pressure in the systolic 150s No leukocytosis with white blood cell count of 12.9, hemoglobin 10.5, platelets 312 Chemistries unremarkable Albumin 3.2 BNP 174 Lasix 40 mg IV given in the ED It was noted the patient recently completed a course of antibiotics but her legs got no better Admission requested for bilateral lower extremity edema with weeping  Assessment and Plan: * CHF exacerbation (HCC) - With bilateral lower extremity edema with weeping - Evidence of diuresis in the skin already - Continue diuresis - Echo in the a.m. - Unlikely to have cellulitis in both lower extremities, patient does have a leukocytosis - Procalcitonin pending - She denies orthopnea, dyspnea - She is noncompliant with her Lasix at home - Last echo was done last year and showed an ejection fraction of 55% and grade 1 diastolic dysfunction - Update echo - Continue to monitor  History of DVT (deep vein thrombosis) - Continue Eliquis  Hyperlipidemia - Statins are contraindicated - Continue Zetia       Advance Care Planning:   Code Status: Full Code  Consults: None at this time  Family Communication: No family at bedside  Severity of Illness: The appropriate patient status for this patient is OBSERVATION. Observation status is judged to be reasonable and necessary in order to provide the required intensity of service to ensure the patient's safety. The patient's presenting symptoms, physical exam findings, and initial radiographic and laboratory data in the context of their medical condition is felt to place them at decreased risk for further clinical deterioration. Furthermore, it is anticipated that the patient will be medically stable for discharge from the hospital within 2 midnights of admission.   Author: Lilyan Gilford, DO 12/08/2022 6:14 AM  For on call review [email or web address].

## 2022-12-08 NOTE — Assessment & Plan Note (Signed)
-   With bilateral lower extremity edema with weeping - Evidence of diuresis in the skin already - Continue diuresis - Echo in the a.m. - Unlikely to have cellulitis in both lower extremities, patient does have a leukocytosis - Procalcitonin pending - She denies orthopnea, dyspnea - She is noncompliant with her Lasix at home - Last echo was done last year and showed an ejection fraction of 55% and grade 1 diastolic dysfunction - Update echo - Continue to monitor

## 2022-12-08 NOTE — ED Notes (Signed)
ED TO INPATIENT HANDOFF REPORT  ED Nurse Name and Phone #:  Alphonzo Lemmings, RN   S Name/Age/Gender [name] [name] 89 y.o. female Room/Bed: APA17/APA17  Code Status   Code Status: Prior  Home/SNF/Other Home Patient oriented to: self, place, time, and situation Is this baseline? Yes   Triage Complete: Triage complete  Chief Complaint CHF exacerbation (HCC) [I50.9]  Triage Note Pt was told to come here by her [facility] nurse due to worsening of her lower legs with cellulitis; pt had legs wrapped today   Allergies Allergies  Allergen Reactions   Cellcept [Mycophenolate Mofetil]     Muscle cramps, mouth soreness, difficulty swallowing, bruising   Statins     Dx of in   Ciprofloxacin Hcl Other (See Comments)    Chills, sick, could not tolerate it   Hydrocodone Nausea Only   Penicillins Rash    Level of Care/Admitting Diagnosis ED Disposition     ED Disposition  Admit   Condition  --   Comment  Hospital Area: [facility] [[id number]]  Level of Care: Telemetry [5]  Covid Evaluation: Asymptomatic - no recent exposure (last 10 days) testing not required  Diagnosis: CHF exacerbation [facility]) [[id number]]  Admitting Physician: Lilyan Gilford [[id number]]  Attending Physician: Lilyan Gilford [[id number]]          B Medical/Surgery History Past Medical History:  Diagnosis Date   Acute deep vein thrombosis (DVT) of left femoral vein (HCC) 02/07/2019   Dx 02/05/2019   Arthritis    spinal stenosis   Deep vein thrombosis (DVT) of left lower extremity (HCC) 08/04/2019   Elevated LFTs    secondary to fatty liver, negative work-up in 2011   Hypercholesterolemia    Hypertension    Lumbar spinal stenosis 01/14/2018   L3-4 and L4-5   Myopathy 02/15/2018   Osteoarthritis    right knee   Peripheral neuropathy 07/06/2018   PONV (postoperative nausea and vomiting)    Uterine cancer (HCC) 08/2006   grade 1, no recurrence up to 2013   Past Surgical History:  Procedure Laterality  Date   ABDOMINAL HYSTERECTOMY  2008   adenocarcinoma stage 1   APPENDECTOMY  1973   BREAST BIOPSY Left 2018   Benign   CHOLECYSTECTOMY  1973   COLONOSCOPY    06/21/2007   [id number] rectum/Sigmoid diverticula, diminutive hepatic flexure polyp s/p bx. Benign.    COLONOSCOPY N/A 10/17/2012   Dr. Darrick Penna- moderate diverticulosis was noted in the sigmoid colon, moderate sized internal hemorrhoids. next tcs in10 years   ESOPHAGOGASTRODUODENOSCOPY N/A 03/20/2016   Dr. Darrick Penna, widely patent Schatzki ring, anemia due to ASA induced erosive gastritis, mild duodenitis. gastric bx benign without H.pylori.    FOOT SURGERY  2007   Pins in toes on left foot, 5 to 6 yrs ago   GIVENS CAPSULE STUDY N/A 03/20/2016   Procedure: GIVENS CAPSULE STUDY;  Surgeon: West Bali, MD;  [facility];  Service: Endoscopy;  Laterality: N/A;   MUSCLE BIOPSY Left 06/07/2018   Procedure: LEFT THIGH MUSCLE BIOPSY;  Surgeon: Harriette Bouillon, MD;  Location: [facility];  Service: General;  Laterality: Left;   TONSILLECTOMY       A IV Location/Drains/Wounds Patient Lines/Drains/Airways Status     Active Line/Drains/Airways     Name Placement date Placement time Site Days   Peripheral IV 12/07/22 20 G 1" Right Antecubital 12/07/22  2259  Antecubital  1  Intake/Output Last 24 hours No intake or output data in the 24 hours ending 12/08/22 0011  Labs/Imaging Results for orders placed or performed during the hospital encounter of 12/07/22 (from the past 48 hour(s))  Comprehensive metabolic panel     Status: Abnormal   Collection Time: 12/07/22  7:27 PM  Result Value Ref Range   Sodium 140 135 - 145 mmol/L   Potassium 4.1 3.5 - 5.1 mmol/L   Chloride 101 98 - 111 mmol/L   CO2 28 22 - 32 mmol/L   Glucose, Bld 128 (H) 70 - 99 mg/dL    Comment: Glucose reference range applies only to samples taken after fasting for at least 8 hours.   BUN 22 8 - 23 mg/dL   Creatinine, Ser 9.60  0.44 - 1.00 mg/dL   Calcium 9.1 8.9 - 45.4 mg/dL   Total Protein 6.5 6.5 - 8.1 g/dL   Albumin 3.2 (L) 3.5 - 5.0 g/dL   AST 25 15 - 41 U/L   ALT 23 0 - 44 U/L   Alkaline Phosphatase 50 38 - 126 U/L   Total Bilirubin 0.7 0.3 - 1.2 mg/dL   GFR, Estimated >09 >81 mL/min    Comment: (NOTE) Calculated using the CKD-EPI Creatinine Equation (2021)    Anion gap 11 5 - 15    Comment: Performed at [facility], [address], [place], [place] [address]  CBC with Differential     Status: Abnormal   Collection Time: 12/07/22  7:27 PM  Result Value Ref Range   WBC 12.9 (H) 4.0 - 10.5 K/uL   RBC 3.43 (L) 3.87 - 5.11 MIL/uL   Hemoglobin 10.5 (L) 12.0 - 15.0 g/dL   HCT 82.9 (L) 56.2 - 13.0 %   MCV 98.5 80.0 - 100.0 fL   MCH 30.6 26.0 - 34.0 pg   MCHC 31.1 30.0 - 36.0 g/dL   RDW 86.5 78.4 - 69.6 %   Platelets 312 150 - 400 K/uL   nRBC 0.0 0.0 - 0.2 %   Neutrophils Relative % 91 %   Neutro Abs 11.7 (H) 1.7 - 7.7 K/uL   Lymphocytes Relative 5 %   Lymphs Abs 0.7 0.7 - 4.0 K/uL   Monocytes Relative 3 %   Monocytes Absolute 0.4 0.1 - 1.0 K/uL   Eosinophils Relative 0 %   Eosinophils Absolute 0.0 0.0 - 0.5 K/uL   Basophils Relative 0 %   Basophils Absolute 0.0 0.0 - 0.1 K/uL   Immature Granulocytes 1 %   Abs Immature Granulocytes 0.07 0.00 - 0.07 K/uL    Comment: Performed at [facility], [address], [place], [place] [address]  Brain natriuretic peptide     Status: Abnormal   Collection Time: 12/07/22  7:27 PM  Result Value Ref Range   B Natriuretic Peptide 174.0 (H) 0.0 - 100.0 pg/mL    Comment: Performed at [facility], [address], [place], [place] [address]   No results found.  Pending Labs Unresulted Labs (From admission, onward)    None       Vitals/Pain Today's Vitals   12/07/22 1627 12/07/22 1628 12/07/22 2020 12/07/22 2040  BP:  (!) 183/90  (!) 178/88  Pulse:  83  78  Resp:  18  18  Temp:  97.8 F (36.6 C)  98.2 F (36.8 C)  TempSrc:  Oral  Oral  SpO2:  99%   99%  Weight: 163 lb (73.9 kg)     Height: 5\' 6"  (1.676 m)  PainSc: 6   6      Isolation Precautions No active isolations  Medications Medications  furosemide (LASIX) injection 40 mg (40 mg Intravenous Given 12/07/22 2333)    Mobility walks with device     Focused Assessments    R Recommendations: See Admitting Provider Note  Report given to:   Additional Notes:  Pt has purewick. Pt has wounds to bilateral lower legs. Pt a&ox4.

## 2022-12-08 NOTE — Progress Notes (Addendum)
PROGRESS NOTE    [name] [name]  ZDG:[id number] DOB: [birth year] DOA: 12/07/2022 PCP: Kerri Perches, MD   Brief Narrative:  89 year old with history of TIA, HTN, HLD, arthritis comes with bilateral lower extremity edema overall poor historian comes to the hospital complains of bilateral lower extremity swelling.  She is admitted to the hospital with concerns of CHF exacerbation  Assessment & Plan:  Principal Problem:   CHF exacerbation (HCC) Active Problems:   Hyperlipidemia   History of DVT (deep vein thrombosis)   Pressure injury of skin   Acute congestive heart failure with preserved ejection fraction, 55%.  Class III Bilateral lower extremity swelling Admitted for bilateral lower extremity swelling, started on IV diuretics.  Admitting provider placed patient on Lasix due to concerns of fluid overload.  Echocardiogram has already been performed, results are pending at this time. No suspicion for DVT as patient is already on Eliquis  Bilateral lower extremity chronic venous dermatitis - Does have chronic wound.  Has outpatient follow-up with wound care on September 30.  Will consult inpatient wound care.  Pictures in the chart.  No obvious signs of infection but low threshold to start empiric antibiotics.  Hypokalemia - Electrolyte repletion   History of DVT (deep vein thrombosis) Continue Eliquis   Hyperlipidemia Statins are contraindicated as patient is allergic.  She is currently on Zetia  PT/OT  DVT prophylaxis: ELiquis Code Status: DNR Family Communication:   Continue hospital stay for IV diuretics    Subjective: Seen at bedside, feeling somewhat better after receiving diuretics overnight.  Does have bilateral lower extremity chronic skin changes   Examination:  General exam: Appears calm and comfortable  Respiratory system: Clear to auscultation. Respiratory effort normal. Cardiovascular system: S1 & S2 heard, RRR. No JVD, murmurs, rubs, gallops or clicks.  No pedal edema. Gastrointestinal system: Abdomen is nondistended, soft and nontender. No organomegaly or masses felt. Normal bowel sounds heard. Central nervous system: Alert and oriented. No focal neurological deficits. Extremities: Symmetric 5 x 5 power. Skin: Bilateral superficial lower extremity skin sloughing and changes.  Does not feel warm at this time. Psychiatry: Judgement and insight appear normal. Mood & affect appropriate.         Pressure Injury 12/08/22 Sacrum Medial Stage 2 -  Partial thickness loss of dermis presenting as a shallow open injury with a red, pink wound bed without slough. pink wound bed with irregular edges and no drainage (Active)  12/08/22 0110  Location: Sacrum  Location Orientation: Medial  Staging: Stage 2 -  Partial thickness loss of dermis presenting as a shallow open injury with a red, pink wound bed without slough.  Wound Description (Comments): pink wound bed with irregular edges and no drainage  Present on Admission: Yes     Diet Orders (From admission, onward)     Start     Ordered   12/07/22 2331  Diet Heart Room service appropriate? Yes; Fluid consistency: Thin; Fluid restriction: 1500 mL Fluid  Diet effective now       Question Answer Comment  Room service appropriate? Yes   Fluid consistency: Thin   Fluid restriction: 1500 mL Fluid      12/07/22 2330            Objective: Vitals:   12/08/22 0015 12/08/22 0053 12/08/22 0139 12/08/22 0510  BP: (!) 178/82 (!) 205/89 (!) 158/98 (!) 154/64  Pulse: 83 87  79  Resp: 17 20  18   Temp: 98 F (36.7 C) 97.9 F (  36.6 C)  98.1 F (36.7 C)  TempSrc: Oral Oral    SpO2: 100% 98%  100%  Weight:  66.9 kg  65.7 kg  Height:        Intake/Output Summary (Last 24 hours) at 12/08/2022 0858 Last data filed at 12/08/2022 0500 Gross per 24 hour  Intake 100 ml  Output 2600 ml  Net -2500 ml   Filed Weights   12/07/22 1627 12/08/22 0053 12/08/22 0510  Weight: 73.9 kg 66.9 kg 65.7 kg     Scheduled Meds:  apixaban  5 mg Oral BID   furosemide  40 mg Intravenous Daily   gabapentin  100 mg Oral QHS   mirabegron ER  25 mg Oral Daily   montelukast  10 mg Oral QHS   Continuous Infusions:  Nutritional status     Body mass index is 23.38 kg/m.  Data Reviewed:   CBC: Recent Labs  Lab 12/07/22 1927 12/08/22 0500  WBC 12.9* 10.3  NEUTROABS 11.7* 8.5*  HGB 10.5* 10.6*  HCT 33.8* 34.4*  MCV 98.5 97.7  PLT 312 291   Basic Metabolic Panel: Recent Labs  Lab 12/07/22 1927 12/08/22 0500  NA 140 141  K 4.1 3.2*  CL 101 98  CO2 28 29  GLUCOSE 128* 94  BUN 22 20  CREATININE 0.80 0.75  CALCIUM 9.1 8.9  MG  --  1.9   GFR: Estimated Creatinine Clearance: 45.5 mL/min (by C-G formula based on SCr of 0.75 mg/dL). Liver Function Tests: Recent Labs  Lab 12/07/22 1927 12/08/22 0500  AST 25 23  ALT 23 20  ALKPHOS 50 47  BILITOT 0.7 0.7  PROT 6.5 6.3*  ALBUMIN 3.2* 3.0*   No results for input(s): "LIPASE", "AMYLASE" in the last 168 hours. No results for input(s): "AMMONIA" in the last 168 hours. Coagulation Profile: No results for input(s): "INR", "PROTIME" in the last 168 hours. Cardiac Enzymes: No results for input(s): "CKTOTAL", "CKMB", "CKMBINDEX", "TROPONINI" in the last 168 hours. BNP (last 3 results) No results for input(s): "PROBNP" in the last 8760 hours. HbA1C: No results for input(s): "HGBA1C" in the last 72 hours. CBG: No results for input(s): "GLUCAP" in the last 168 hours. Lipid Profile: No results for input(s): "CHOL", "HDL", "LDLCALC", "TRIG", "CHOLHDL", "LDLDIRECT" in the last 72 hours. Thyroid Function Tests: Recent Labs    12/08/22 0500  TSH 3.929   Anemia Panel: No results for input(s): "VITAMINB12", "FOLATE", "FERRITIN", "TIBC", "IRON", "RETICCTPCT" in the last 72 hours. Sepsis Labs: Recent Labs  Lab 12/08/22 0600  PROCALCITON <0.10    No results found for this or any previous visit (from the past 240 hour(s)).        Radiology Studies: No results found.         LOS: 0 days   Time spent= 35 mins    Miguel Rota, MD [facility]  If 7PM-7AM, please contact night-coverage  12/08/2022, 8:58 AM

## 2022-12-08 NOTE — Consult Note (Addendum)
WOC Nurse Consult Note: this consult performed remotely utilizing EMR records including photo documentation and chat with bedside nurse; patient being followed by primary MD for venous ulcers, twice weekly compression wraps at home by home health; has appointment with wound care center scheduled for 12/21/2022 Reason for Consult: leg wounds  Wound type: full thickness, r/t venous insufficiency  Pressure Injury POA: Yes Measurement: see nursing flowsheet  Wound bed: anterior leg ulcer 75% yellow dry 25% pink; lateral leg ulcer 50% yellow 50% pink moist; posterior leg ulcer 75% yellow dry 25% pink  Drainage (amount, consistency, odor) see nursing flowsheets; per bedside nurse draining  Periwound: edema, chronic changes of venous stasis  Dressing procedure/placement/frequency: Clean bilateral leg ulcers with NS, apply silver hydrofiber [place] [phone]) to wound beds daily, cover with ABD pad and wrap with Kerlix roll gauze beginning just above toes and ending right below knees.  Secure dressings with Ace bandage wrapped in same fashion as Kerlix for light compression.    Patient should resume compression wraps by home health at discharge and keep appointment with wound care center 12/21/2022 for these chronic ulcers.   POC discussed with bedside nurse. WOC team will not follow at this time. Re-consult if further needs arise.   Thank you,    Priscella Mann MSN, RN-BC, [employer] [phone]

## 2022-12-08 NOTE — Evaluation (Signed)
Physical Therapy Evaluation Patient Details Name: [name] [name] MRN: [id number] DOB: 08-Nov-1932 Today's Date: 12/08/2022  History of Present Illness  BRYSON GRAFE is a 89 y.o. female with medical history significant of TIA, hypertension, hyperlipidemia with statins being contraindicated, and more presents the ED with a chief complaint of bilateral lower extremity edema.   Clinical Impression  Patient had difficulty moving legs during bed mobility due to pain in wounds and LLE weakness, once seated demonstrates good sitting balance while scooting laterally at bedside and able to scoot over to chair, but required frequent  rest breaks and Min assist.  Patient tolerated sitting up in chair after therapy with her family member present in room.  Patient will benefit from continued skilled physical therapy in hospital and recommended venue below to increase strength, balance, endurance for safe ADLs and gait.          If plan is discharge home, recommend the following: A little help with walking and/or transfers;A little help with bathing/dressing/bathroom;Help with stairs or ramp for entrance;Assistance with cooking/housework   Can travel by private vehicle        Equipment Recommendations None recommended by PT  Recommendations for Other Services       Functional Status Assessment Patient has had a recent decline in their functional status and demonstrates the ability to make significant improvements in function in a reasonable and predictable amount of time.     Precautions / Restrictions Precautions Precautions: Fall Restrictions Weight Bearing Restrictions: No      Mobility  Bed Mobility Overal bed mobility: Needs Assistance Bed Mobility: Supine to Sit     Supine to sit: Mod assist     General bed mobility comments: slow labored movement with limited use of left hand due to poor grip    Transfers Overall transfer level: Needs assistance Equipment used: 1 person hand held  assist Transfers: Bed to chair/wheelchair/BSC            Lateral/Scoot Transfers: Contact guard assist, Min assist, From elevated surface General transfer comment: fair/good return for scooting laterally using BUE and RLE, limited use of LLE due to weakness, increased pain with movement    Ambulation/Gait                  Stairs            Wheelchair Mobility     Tilt Bed    Modified Rankin (Stroke Patients Only)       Balance Overall balance assessment: Needs assistance Sitting-balance support: Feet supported, No upper extremity supported Sitting balance-Leahy Scale: Good Sitting balance - Comments: seated at EOB                                     Pertinent Vitals/Pain Pain Assessment Pain Assessment: Faces Faces Pain Scale: Hurts little more Pain Location: BLE Pain Descriptors / Indicators: Grimacing, Guarding Pain Intervention(s): Limited activity within patient's tolerance, Monitored during session, Repositioned    Home Living Family/patient expects to be discharged to:: Private residence Living Arrangements: Alone Available Help at Discharge: Family;Available PRN/intermittently;Personal care attendant Type of Home: House Home Access: Stairs to enter Entrance Stairs-Rails: Doctor, general practice of Steps: 5   Home Layout: One level Home Equipment: Agricultural consultant (2 wheels);Grab bars - tub/shower;Shower seat;BSC/3in1;Wheelchair - manual      Prior Function Prior Level of Function : Needs assist  Physical Assist : ADLs (physical);Mobility (physical) Mobility (physical): Bed mobility;Transfers;Gait;Stairs   Mobility Comments: Modified independent for transfers scooting over from wheelchair to bed ADLs Comments: Patient needs assist with ADL's with help from home aides who come for a couple of hours a day M-F. Performs sliding transfers between surfaces     Extremity/Trunk Assessment   Upper Extremity  Assessment Upper Extremity Assessment: Defer to OT evaluation RUE Deficits / Details: Pt reportedly with dupuytren's contracture in right hand. OT notes difficulty with extending digits and gripping chair arm during transfer    Lower Extremity Assessment Lower Extremity Assessment: Generalized weakness    Cervical / Trunk Assessment Cervical / Trunk Assessment: Normal  Communication   Communication Communication: No apparent difficulties  Cognition Arousal: Alert Behavior During Therapy: WFL for tasks assessed/performed Overall Cognitive Status: Within Functional Limits for tasks assessed                                          General Comments      Exercises     Assessment/Plan    PT Assessment Patient needs continued PT services  PT Problem List Decreased strength;Decreased activity tolerance;Decreased mobility;Pain;Decreased balance       PT Treatment Interventions DME instruction;Functional mobility training;Therapeutic activities;Therapeutic exercise;Balance training;Wheelchair mobility training;Patient/family education    PT Goals (Current goals can be found in the Care Plan section)  Acute Rehab PT Goals Patient Stated Goal: return home with home aides/family to assist PT Goal Formulation: With patient/family Time For Goal Achievement: 12/11/22 Potential to Achieve Goals: Good    Frequency Min 3X/week     Co-evaluation PT/OT/SLP Co-Evaluation/Treatment: Yes Reason for Co-Treatment: Complexity of the patient's impairments (multi-system involvement);To address functional/ADL transfers PT goals addressed during session: Mobility/safety with mobility;Balance;Proper use of DME OT goals addressed during session: ADL's and self-care       AM-PAC PT "6 Clicks" Mobility  Outcome Measure Help needed turning from your back to your side while in a flat bed without using bedrails?: A Little Help needed moving from lying on your back to sitting on  the side of a flat bed without using bedrails?: A Lot Help needed moving to and from a bed to a chair (including a wheelchair)?: A Little Help needed standing up from a chair using your arms (e.g., wheelchair or bedside chair)?: A Lot Help needed to walk in hospital room?: Total Help needed climbing 3-5 steps with a railing? : Total 6 Click Score: 12    End of Session   Activity Tolerance: Patient tolerated treatment well;Patient limited by fatigue Patient left: in chair;with call bell/phone within reach;with family/visitor present Nurse Communication: Mobility status PT Visit Diagnosis: Unsteadiness on feet (R26.81);Muscle weakness (generalized) (M62.81);Other abnormalities of gait and mobility (R26.89)    Time: 4401-0272 PT Time Calculation (min) (ACUTE ONLY): 28 min   Charges:   PT Evaluation $PT Eval Moderate Complexity: 1 Mod PT Treatments $Therapeutic Activity: 23-37 mins PT General Charges $$ ACUTE PT VISIT: 1 Visit         1:41 PM, 12/08/22 Ocie Bob, MPT Physical Therapist with [facility] [phone] office [phone] mobile phone

## 2022-12-08 NOTE — TOC Initial Note (Signed)
Transition of Care [facility]) - Initial/Assessment Note    Patient Details  Name: [name] [name] MRN: [id number] Date of Birth: May 30, 1932  [facility] CM/SW Contact:    Elliot Gault, LCSW Phone Number: 12/08/2022, 3:16 PM  Clinical Narrative:                  Pt admitted from home. Received TOC consult for CHF. Met with pt at bedside to assess. Per pt, she lives alone and she has an Aide M-F 3-4 hours a day. Aide helps with ADLs and taking her to appointments and getting her RX's. Pt states she mostly sits in her wheelchair at home and propels herself from room to room.  Pt states that she does not follow a specific heart health diet or weigh daily. Educational material added to pt's AVS.  Pt states she is active with [facility] RN. She is agreeable to adding PT as recommended by PT here.  Discussed housing SDOH with pt who reports having no concerns.  TOC will follow and assist as needed.  Expected Discharge Plan: Home w Home Health Services Barriers to Discharge: Continued Medical Work up   Patient Goals and CMS Choice Patient states their goals for this hospitalization and ongoing recovery are:: go home   Choice offered to / list presented to : Patient [place] ownership interest in [facility].provided to:: Patient    Expected Discharge Plan and Services In-house Referral: Clinical Social Work   Post Acute Care Choice: Resumption of Svcs/PTA Provider Living arrangements for the past 2 months: Single Family Home                                      Prior Living Arrangements/Services Living arrangements for the past 2 months: Single Family Home Lives with:: Self Patient language and need for interpreter reviewed:: Yes Do you feel safe going back to the place where you live?: Yes      Need for Family Participation in Patient Care: No (Comment) Care giver support system in place?: Yes (comment) Current home services: DME, Home RN Criminal  Activity/Legal Involvement Pertinent to Current Situation/Hospitalization: No - Comment as needed  Activities of Daily Living Home Assistive Devices/Equipment: Wheelchair ADL Screening (condition at time of admission) Patient's cognitive ability adequate to safely complete daily activities?: Yes Is the patient deaf or have difficulty hearing?: No Does the patient have difficulty seeing, even when wearing glasses/contacts?: No Does the patient have difficulty concentrating, remembering, or making decisions?: No Patient able to express need for assistance with ADLs?: Yes Does the patient have difficulty dressing or bathing?: Yes Independently performs ADLs?: No Communication: Independent Dressing (OT): Needs assistance Is this a change from baseline?: Pre-admission baseline Grooming: Needs assistance Is this a change from baseline?: Pre-admission baseline Feeding: Independent Bathing: Needs assistance Is this a change from baseline?: Pre-admission baseline Toileting: Independent In/Out Bed: Independent Walks in Home: Needs assistance Is this a change from baseline?: Pre-admission baseline Does the patient have difficulty walking or climbing stairs?: Yes Weakness of Legs: Both Weakness of Arms/Hands: None  Permission Sought/Granted                  Emotional Assessment Appearance:: Appears younger than stated age Attitude/Demeanor/Rapport: Engaged Affect (typically observed): Pleasant Orientation: : Oriented to Self, Oriented to Place, Oriented to  Time, Oriented to Situation Alcohol / Substance Use: Not Applicable Psych Involvement:  No (comment)  Admission diagnosis:  Peripheral edema [R60.0] CHF exacerbation (HCC) [I50.9] Venous stasis dermatitis [I87.2] Acute exacerbation of CHF (congestive heart failure) (HCC) [I50.9] Patient Active Problem List   Diagnosis Date Noted   History of DVT (deep vein thrombosis) 12/08/2022   Pressure injury of skin 12/08/2022   Acute  exacerbation of CHF (congestive heart failure) (HCC) 12/08/2022   CHF exacerbation (HCC) 12/07/2022   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   Diastolic dysfunction 01/18/2022   Aortic stenosis 01/16/2022   Mitral valve regurgitation 01/16/2022   Encounter for Medicare annual examination with abnormal findings 12/07/2021   Breast lump on right side at 11 o'clock position 11/06/2021   Severe protein-calorie malnutrition (HCC) 09/25/2021   Aortic atherosclerosis (HCC) 09/25/2021   Rhabdomyolysis 09/20/2021   Recurrent falls 09/20/2021   Leukocytosis 09/20/2021   Chronic deep vein thrombosis (DVT) of distal vein of left lower extremity (HCC) 09/20/2021   Elevated troponin 09/20/2021   Osteoarthritis 09/20/2021   Paresthesia 05/05/2021   Gait abnormality 05/05/2021   Dermatitis 05/01/2021   Leg ulcer, left (HCC) 03/25/2021   HZV (herpes zoster virus) post herpetic neuralgia 05/31/2020   Zoster 04/30/2020   Hypomagnesemia 08/11/2019   Lower extremity edema 08/06/2019   Bilateral lower extremity edema 08/04/2019   Hypokalemia 08/04/2019   Hypoalbuminemia 08/04/2019   Venous stasis ulcers (HCC) 04/03/2019   Lower leg edema 03/22/2019   At high risk for falls per [name] fall risk assessment scale 02/09/2019   Neck pain, bilateral posterior 01/13/2019   Statins contraindicated 08/14/2018   Peripheral neuropathy 07/06/2018   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   Myopathy 02/15/2018   Lumbar spinal stenosis 01/14/2018   GERD (gastroesophageal reflux disease) 06/17/2016   Normocytic anemia    Sciatica of left side associated with disorder of lumbosacral spine 11/29/2014   Chronic constipation 10/04/2012   Urinary incontinence 06/20/2012   Vitamin D deficiency 08/22/2011   Prediabetes 05/29/2011   NASH (nonalcoholic steatohepatitis) 11/05/2009   Arthritis of right knee 09/04/2009   Hyperlipidemia 09/08/2007   Essential hypertension 09/08/2007   PCP:  Kerri Perches, MD Pharmacy:   [facility] [address] [place] - [address] [place] [address] [place] [address] Phone: [phone] Fax: [phone]  [facility] - [place], [place] - [address] [place] [address] Phone: [phone] Fax: [phone]     Social Determinants of Health (SDOH) Social History: SDOH Screenings   Food Insecurity: No Food Insecurity (12/08/2022)  Housing: Medium Risk (12/08/2022)  Transportation Needs: No Transportation Needs (12/08/2022)  Utilities: Not At Risk (12/08/2022)  Alcohol Screen: Low Risk  (01/23/2021)  Depression (PHQ2-9): Low Risk  (11/10/2022)  Financial Resource Strain: Low Risk  (01/23/2021)  Physical Activity: Insufficiently Active (01/23/2021)  Social Connections: Socially Isolated (01/23/2021)  Stress: No Stress Concern Present (01/23/2021)  Tobacco Use: Low Risk  (12/07/2022)   SDOH Interventions:     Readmission Risk Interventions     No data to display

## 2022-12-08 NOTE — Assessment & Plan Note (Signed)
-   Statins are contraindicated - Continue Zetia

## 2022-12-08 NOTE — Plan of Care (Signed)
Problem: Education: Goal: Knowledge of General Education information will improve Description: Including pain rating scale, medication(s)/side effects and non-pharmacologic comfort measures Outcome: Progressing   Problem: Health Behavior/Discharge Planning: Goal: Ability to manage health-related needs will improve Outcome: Not Met (add Reason)   Problem: Clinical Measurements: Goal: Ability to maintain clinical measurements within normal limits will improve Outcome: Not Met (add Reason)

## 2022-12-08 NOTE — Progress Notes (Signed)
*  PRELIMINARY RESULTS* Echocardiogram 2D Echocardiogram has not been performed. Patient refused at this time.  Stacey Drain 12/08/2022, 8:32 AM

## 2022-12-09 DIAGNOSIS — Z993 Dependence on wheelchair: Secondary | ICD-10-CM | POA: Diagnosis not present

## 2022-12-09 DIAGNOSIS — R0989 Other specified symptoms and signs involving the circulatory and respiratory systems: Secondary | ICD-10-CM | POA: Diagnosis not present

## 2022-12-09 DIAGNOSIS — N3000 Acute cystitis without hematuria: Secondary | ICD-10-CM | POA: Diagnosis not present

## 2022-12-09 DIAGNOSIS — I5033 Acute on chronic diastolic (congestive) heart failure: Secondary | ICD-10-CM | POA: Diagnosis not present

## 2022-12-09 DIAGNOSIS — I509 Heart failure, unspecified: Secondary | ICD-10-CM | POA: Diagnosis not present

## 2022-12-09 DIAGNOSIS — R55 Syncope and collapse: Secondary | ICD-10-CM | POA: Diagnosis not present

## 2022-12-09 DIAGNOSIS — R531 Weakness: Secondary | ICD-10-CM | POA: Diagnosis not present

## 2022-12-09 DIAGNOSIS — R0602 Shortness of breath: Secondary | ICD-10-CM | POA: Diagnosis not present

## 2022-12-09 MED ORDER — SENNOSIDES-DOCUSATE SODIUM 8.6-50 MG PO TABS: 2.0000 | ORAL_TABLET | Freq: Every evening | ORAL | 0 refills | Status: DC | PRN

## 2022-12-09 MED ORDER — OXYCODONE HCL 5 MG PO TABS: 5.0000 mg | ORAL_TABLET | Freq: Four times a day (QID) | ORAL | 0 refills | Status: DC | PRN

## 2022-12-09 NOTE — TOC Transition Note (Signed)
Transition of Care [facility]) - CM/SW Discharge Note   Patient Details  Name: [name] [name] MRN: [id number] Date of Birth: 08-Apr-1932  [facility] of Care [facility] CM/SW Contact:  Elliot Gault, LCSW Phone Number: 12/09/2022, 11:34 AM   Clinical Narrative:     Pt medically stable for dc per MD. Plan remains for return home with continued [facility] services.  TOC received message from [facility] rep this AM stating that they are the agency (not [facility]) providing HH to pt. They are able to continue at dc. Updated rep that pt will dc today and orders entered. They will follow up with pt at home.  Pt's son transporting home. No other TOC needs for dc.  Final next level of care: Home w Home Health Services Barriers to Discharge: Barriers Resolved   Patient Goals and CMS Choice   Choice offered to / list presented to : Patient  Discharge Placement                         Discharge Plan and Services Additional resources added to the After Visit Summary for   In-house Referral: Clinical Social Work   Post Acute Care Choice: Resumption of Svcs/PTA Provider                               Social Determinants of Health (SDOH) Interventions SDOH Screenings   Food Insecurity: No Food Insecurity (12/08/2022)  Housing: Medium Risk (12/08/2022)  Transportation Needs: No Transportation Needs (12/08/2022)  Utilities: Not At Risk (12/08/2022)  Alcohol Screen: Low Risk  (01/23/2021)  Depression (PHQ2-9): Low Risk  (11/10/2022)  Financial Resource Strain: Low Risk  (01/23/2021)  Physical Activity: Insufficiently Active (01/23/2021)  Social Connections: Socially Isolated (01/23/2021)  Stress: No Stress Concern Present (01/23/2021)  Tobacco Use: Low Risk  (12/07/2022)     Readmission Risk Interventions     No data to display

## 2022-12-09 NOTE — Progress Notes (Signed)
Patient rested on and off through the night, with multiple attempts to get out of bed unassisted, patient redirected. Wound care performed to bilateral lower extremities. Patient resting in bed at this time, bed alarm on, bed in lowest position, and call bell within reach.

## 2022-12-09 NOTE — Progress Notes (Signed)
Wound care completed to bilateral lower extremities.

## 2022-12-09 NOTE — Discharge Summary (Signed)
Physician Discharge Summary  [name] [name] LKG:[id number] DOB: 03-09-1933 DOA: 12/07/2022  PCP: Kerri Perches, MD  Admit date: 12/07/2022 Discharge date: 12/09/2022  Admitted From: Home Disposition: Home  Recommendations for Outpatient Follow-up:  Follow up with PCP in 1-2 weeks Please obtain BMP/CBC in one week your next doctors visit.  Outpatient follow-up at wound care center, instructions given Should follow-up with outpatient cardiology.  Home Health: PT/OT/RN Equipment/Devices: None Discharge Condition: Stable CODE STATUS: DNR Diet recommendation: Heart healthy  Brief Narrative:  89 year old with history of TIA, HTN, HLD, arthritis comes with bilateral lower extremity edema overall poor historian comes to the hospital complains of bilateral lower extremity swelling.  She is admitted to the hospital with concerns of CHF exacerbation.  During hospitalization she was started on IV diuretic which she responded well to.  Echocardiogram showed preserved EF, should follow-up outpatient cardiology.   Assessment & Plan:  Principal Problem:   CHF exacerbation (HCC) Active Problems:   Hyperlipidemia   History of DVT (deep vein thrombosis)   Pressure injury of skin   Acute congestive heart failure with preserved ejection fraction, 55%.  Class III Bilateral lower extremity swelling During the hospital patient started on IV diuretics which she responded well to.  Echocardiogram showed preserved EF with inferior wall motion abnormality.  Should follow-up with outpatient cardiology.  She can go back on home oral Lasix. No suspicion for DVT as patient is already on Eliquis   Bilateral lower extremity chronic venous dermatitis - Does have chronic wound.  Seen by wound care team, has outpatient follow-up at the wound care center on September 30.  Instructions given.   Hypokalemia - Electrolyte repletion   History of DVT (deep vein thrombosis) Continue Eliquis    Hyperlipidemia Statins are contraindicated as patient is allergic.  She is currently on Zetia   PT/OT-home health services    Discharge Diagnoses:  Principal Problem:   CHF exacerbation (HCC) Active Problems:   Hyperlipidemia   History of DVT (deep vein thrombosis)   Pressure injury of skin   Acute exacerbation of CHF (congestive heart failure) (HCC)      Consultations: Wound care  Subjective: Feeling well no complaints.  Discharge Exam: Vitals:   12/08/22 2057 12/09/22 0356  BP: 134/60 114/60  Pulse: 98 95  Resp:  18  Temp: 98.2 F (36.8 C) 98.3 F (36.8 C)  SpO2: 98% 100%   Vitals:   12/08/22 1824 12/08/22 2057 12/09/22 0356 12/09/22 0500  BP: (!) 162/96 134/60 114/60   Pulse: 99 98 95   Resp: 20  18   Temp: 98.1 F (36.7 C) 98.2 F (36.8 C) 98.3 F (36.8 C)   TempSrc:  Oral    SpO2: 100% 98% 100%   Weight:    64.3 kg  Height:        General: Pt is alert, awake, not in acute distress Cardiovascular: RRR, S1/S2 +, no rubs, no gallops Respiratory: CTA bilaterally, no wheezing, no rhonchi Abdominal: Soft, NT, ND, bowel sounds + Extremities: no edema, no cyanosis  Discharge Instructions   Allergies as of 12/09/2022       Reactions   Cellcept [mycophenolate Mofetil] Other (See Comments)   Myalgias Mouth sores Difficulty swallowing Bruising   Cipro [ciprofloxacin Hcl] Other (See Comments)   Chills Sick   Statins Other (See Comments)   Dx of in   Hydrocodone Nausea Only   Penicillins Rash        Medication List     STOP  taking these medications    doxycycline 100 MG tablet Commonly known as: VIBRA-TABS   predniSONE 5 MG tablet Commonly known as: DELTASONE       TAKE these medications    acetaminophen 650 MG CR tablet Commonly known as: TYLENOL Take 650 mg by mouth every 8 (eight) hours as needed for pain or fever.   cholecalciferol 25 MCG (1000 UNIT) tablet Commonly known as: VITAMIN D3 Take 1 tablet (1,000 Units total)  by mouth daily.   clotrimazole-betamethasone cream Commonly known as: LOTRISONE Apply 1 Application topically 2 (two) times daily. What changed:  when to take this reasons to take this   Eliquis 5 MG Tabs tablet Generic drug: apixaban TAKE 1 TABLET TWICE DAILY   ezetimibe 10 MG tablet Commonly known as: ZETIA TAKE 1 TABLET EVERY DAY   folic acid 1 MG tablet Commonly known as: FOLVITE TAKE 1 TABLET EVERY DAY   furosemide 20 MG tablet Commonly known as: LASIX TAKE 1 TABLET EVERY DAY   gabapentin 100 MG capsule Commonly known as: NEURONTIN TAKE 1 CAPSULE EVERY MORNING AND TAKE 2 CAPSULES AT BEDTIME   magnesium oxide 400 (240 Mg) MG tablet Commonly known as: MAG-OX TAKE 1 TABLET TWICE DAILY What changed: when to take this   methotrexate 2.5 MG tablet Commonly known as: RHEUMATREX Take 5 tablets (12.5 mg total) by mouth once a week. Caution:Chemotherapy. Protect from light. What changed:  when to take this additional instructions   mirabegron ER 25 MG Tb24 tablet Commonly known as: MYRBETRIQ Take 1 tablet (25 mg total) by mouth daily.   ofloxacin 0.3 % ophthalmic solution Commonly known as: OCUFLOX Place 1 drop into the left eye 4 (four) times daily.   oxyCODONE 5 MG immediate release tablet Commonly known as: Oxy IR/ROXICODONE Take 1 tablet (5 mg total) by mouth every 6 (six) hours as needed for severe pain or breakthrough pain.   prednisoLONE acetate 1 % ophthalmic suspension Commonly known as: PRED FORTE Place 1 drop into the left eye 3 (three) times daily.   senna-docusate 8.6-50 MG tablet Commonly known as: Senokot-S Take 2 tablets by mouth at bedtime as needed for moderate constipation.   terbinafine 250 MG tablet Commonly known as: LAMISIL Take 1 tablet (250 mg total) by mouth daily.        Allergies  Allergen Reactions   Cellcept [Mycophenolate Mofetil] Other (See Comments)    Myalgias Mouth sores Difficulty swallowing Bruising   Cipro  [Ciprofloxacin Hcl] Other (See Comments)    Chills Sick   Statins Other (See Comments)    Dx of in   Hydrocodone Nausea Only   Penicillins Rash    You were cared for by a hospitalist during your hospital stay. If you have any questions about your discharge medications or the care you received while you were in the hospital after you are discharged, you can call the unit and asked to speak with the hospitalist on call if the hospitalist that took care of you is not available. Once you are discharged, your primary care physician will handle any further medical issues. Please note that no refills for any discharge medications will be authorized once you are discharged, as it is imperative that you return to your primary care physician (or establish a relationship with a primary care physician if you do not have one) for your aftercare needs so that they can reassess your need for medications and monitor your lab values.  You were cared for by a hospitalist  during your hospital stay. If you have any questions about your discharge medications or the care you received while you were in the hospital after you are discharged, you can call the unit and asked to speak with the hospitalist on call if the hospitalist that took care of you is not available. Once you are discharged, your primary care physician will handle any further medical issues. Please note that NO REFILLS for any discharge medications will be authorized once you are discharged, as it is imperative that you return to your primary care physician (or establish a relationship with a primary care physician if you do not have one) for your aftercare needs so that they can reassess your need for medications and monitor your lab values.  Please request your Prim.MD to go over all Hospital Tests and Procedure/Radiological results at the follow up, please get all Hospital records sent to your Prim MD by signing hospital release before you go home.  Get  CBC, CMP, 2 view Chest X ray checked  by Primary MD during your next visit or SNF MD in 5-7 days ( we routinely change or add medications that can affect your baseline labs and fluid status, therefore we recommend that you get the mentioned basic workup next visit with your PCP, your PCP may decide not to get them or add new tests based on their clinical decision)  On your next visit with your primary care physician please Get Medicines reviewed and adjusted.  If you experience worsening of your admission symptoms, develop shortness of breath, life threatening emergency, suicidal or homicidal thoughts you must seek medical attention immediately by calling 911 or calling your MD immediately  if symptoms less severe.  You Must read complete instructions/literature along with all the possible adverse reactions/side effects for all the Medicines you take and that have been prescribed to you. Take any new Medicines after you have completely understood and accpet all the possible adverse reactions/side effects.   Do not drive, operate heavy machinery, perform activities at heights, swimming or participation in water activities or provide baby sitting services if your were admitted for syncope or siezures until you have seen by Primary MD or a Neurologist and advised to do so again.  Do not drive when taking Pain medications.   Procedures/Studies: ECHOCARDIOGRAM COMPLETE  Result Date: 12/08/2022    ECHOCARDIOGRAM REPORT   Patient Name:   [name] [name] Date of Exam: 12/08/2022 Medical Rec #:  [id number]    Height:       66.0 in Accession #:    [id number]   Weight:       144.8 lb Date of Birth:  [birth year]     BSA:          1.744 m Patient Age:    89 years     BP:           145/94 mmHg Patient Gender: F            HR:           93 bpm. [facility] Procedure: 2D Echo, Cardiac Doppler and Color Doppler Indications:    CHF  History:        Patient has prior history of Echocardiogram examinations, most                  recent 09/22/2021. CHF, Signs/Symptoms:Edema; Risk                 Factors:Hypertension and Dyslipidemia.  Sonographer:    Mikki Harbor Referring Phys: [id number] ASIA B ZIERLE-GHOSH  Sonographer Comments: Technically difficult study due to poor echo windows and suboptimal subcostal window. IMPRESSIONS  1. Left ventricular ejection fraction, by estimation, is 50%. The left ventricle has mildly decreased function. Left ventricular endocardial border not optimally defined to evaluate regional wall motion but there appears to be inferior wall hypokinesis.  Consider Limited Echo with contrast. There is mild left ventricular hypertrophy. Left ventricular diastolic parameters are consistent with Grade I diastolic dysfunction (impaired relaxation).  2. Right ventricular systolic function was not well visualized. The right ventricular size is normal. Tricuspid regurgitation signal is inadequate for assessing PA pressure.  3. The mitral valve is grossly normal. Trivial mitral valve regurgitation. No evidence of mitral stenosis.  4. The aortic valve is calcified. There is severe calcifcation of the aortic valve. There is severe thickening of the aortic valve. There is severe restriction in the opening of aortic valve leaflets. Aortic valve regurgitation is mild. Mild aortic valve stenosis based on the gradients but could be moderate based on aortic leaflet mobility. Aortic valve mean gradient measures 11.5 mmHg. Aortic valve Vmax measures 2.29 m/s. FINDINGS  Left Ventricle: Left ventricular ejection fraction, by estimation, is 50%. The left ventricle has mildly decreased function. Left ventricular endocardial border not optimally defined to evaluate regional wall motion. The left ventricular internal cavity  size was normal in size. There is mild left ventricular hypertrophy. Left ventricular diastolic parameters are consistent with Grade I diastolic dysfunction (impaired relaxation). Right Ventricle: The right  ventricular size is normal. No increase in right ventricular wall thickness. Right ventricular systolic function was not well visualized. Tricuspid regurgitation signal is inadequate for assessing PA pressure. Left Atrium: Left atrial size was normal in size. Right Atrium: Right atrial size was normal in size. Pericardium: There is no evidence of pericardial effusion. Mitral Valve: The mitral valve is grossly normal. Trivial mitral valve regurgitation. No evidence of mitral valve stenosis. MV peak gradient, 5.5 mmHg. The mean mitral valve gradient is 2.0 mmHg. Tricuspid Valve: The tricuspid valve is normal in structure. Tricuspid valve regurgitation is not demonstrated. No evidence of tricuspid stenosis. Aortic Valve: The aortic valve is calcified. There is severe calcifcation of the aortic valve. There is severe thickening of the aortic valve. Aortic valve regurgitation is mild. Mild aortic stenosis is present. Aortic valve mean gradient measures 11.5 mmHg. Aortic valve peak gradient measures 21.0 mmHg. Aortic valve area, by VTI measures 1.25 cm. Pulmonic Valve: The pulmonic valve was not well visualized. Pulmonic valve regurgitation is not visualized. No evidence of pulmonic stenosis. Aorta: The aortic root and ascending aorta are structurally normal, with no evidence of dilitation. Venous: The inferior vena cava was not well visualized. IAS/Shunts: No atrial level shunt detected by color flow Doppler.  LEFT VENTRICLE PLAX 2D LVIDd:         3.40 cm   Diastology LVIDs:         2.60 cm   LV e' medial:    4.13 cm/s LV PW:         1.20 cm   LV E/e' medial:  13.9 LV IVS:        1.20 cm   LV e' lateral:   11.50 cm/s LVOT diam:     2.00 cm   LV E/e' lateral: 5.0 LV SV:         60 LV SV Index:   34 LVOT Area:     3.14 cm  RIGHT VENTRICLE RV Basal diam:  3.20 cm RV Mid diam:    2.70 cm RV S prime:     13.50 cm/s LEFT ATRIUM             Index        RIGHT ATRIUM           Index LA diam:        2.90 cm 1.66 cm/m   RA  Area:     13.60 cm LA Vol (A2C):   52.5 ml 30.11 ml/m  RA Volume:   22.80 ml  13.08 ml/m LA Vol (A4C):   47.1 ml 27.01 ml/m LA Biplane Vol: 53.6 ml 30.74 ml/m  AORTIC VALVE                     PULMONIC VALVE AV Area (Vmax):    1.29 cm      PV Vmax:       0.90 m/s AV Area (Vmean):   1.20 cm      PV Peak grad:  3.2 mmHg AV Area (VTI):     1.25 cm AV Vmax:           229.00 cm/s AV Vmean:          155.000 cm/s AV VTI:            0.475 m AV Peak Grad:      21.0 mmHg AV Mean Grad:      11.5 mmHg LVOT Vmax:         93.90 cm/s LVOT Vmean:        59.400 cm/s LVOT VTI:          0.190 m LVOT/AV VTI ratio: 0.40  AORTA Ao Root diam: 2.90 cm Ao Asc diam:  2.90 cm MITRAL VALVE MV Area (PHT): 3.74 cm     SHUNTS MV Area VTI:   2.81 cm     Systemic VTI:  0.19 m MV Peak grad:  5.5 mmHg     Systemic Diam: 2.00 cm MV Mean grad:  2.0 mmHg MV Vmax:       1.17 m/s MV Vmean:      58.1 cm/s MV Decel Time: 203 msec MV E velocity: 57.40 cm/s MV A velocity: 118.00 cm/s MV E/A ratio:  0.49 Vishnu Priya Mallipeddi Electronically signed by Winfield Rast Mallipeddi Signature Date/Time: 12/08/2022/8:27:17 PM    Final      The results of significant diagnostics from this hospitalization (including imaging, microbiology, ancillary and laboratory) are listed below for reference.     Microbiology: No results found for this or any previous visit (from the past 240 hour(s)).   Labs: BNP (last 3 results) Recent Labs    12/07/22 1927  BNP 174.0*   Basic Metabolic Panel: Recent Labs  Lab 12/07/22 1927 12/08/22 0500 12/09/22 0430  NA 140 141 141  K 4.1 3.2* 4.0  CL 101 98 103  CO2 28 29 29   GLUCOSE 128* 94 105*  BUN 22 20 20   CREATININE 0.80 0.75 0.82  CALCIUM 9.1 8.9 8.7*  MG  --  1.9 1.9   Liver Function Tests: Recent Labs  Lab 12/07/22 1927 12/08/22 0500  AST 25 23  ALT 23 20  ALKPHOS 50 47  BILITOT 0.7 0.7  PROT 6.5 6.3*  ALBUMIN 3.2* 3.0*   No results for input(s): "LIPASE", "AMYLASE" in the last 168  hours. No results for input(s): "AMMONIA" in the last 168 hours. CBC: Recent Labs  Lab 12/07/22 1927 12/08/22 0500 12/08/22 0923  WBC 12.9* 10.3 8.9  NEUTROABS 11.7* 8.5*  --   HGB 10.5* 10.6* 10.0*  HCT 33.8* 34.4* 32.7*  MCV 98.5 97.7 97.3  PLT 312 291 277   Cardiac Enzymes: No results for input(s): "CKTOTAL", "CKMB", "CKMBINDEX", "TROPONINI" in the last 168 hours. BNP: Invalid input(s): "POCBNP" CBG: No results for input(s): "GLUCAP" in the last 168 hours. D-Dimer No results for input(s): "DDIMER" in the last 72 hours. Hgb A1c No results for input(s): "HGBA1C" in the last 72 hours. Lipid Profile No results for input(s): "CHOL", "HDL", "LDLCALC", "TRIG", "CHOLHDL", "LDLDIRECT" in the last 72 hours. Thyroid function studies Recent Labs    12/08/22 0500  TSH 3.929   Anemia work up No results for input(s): "VITAMINB12", "FOLATE", "FERRITIN", "TIBC", "IRON", "RETICCTPCT" in the last 72 hours. Urinalysis    Component Value Date/Time   COLORURINE YELLOW 07/29/2022 2150   APPEARANCEUR HAZY (A) 07/29/2022 2150   APPEARANCEUR Cloudy (A) 01/06/2021 1143   LABSPEC 1.014 07/29/2022 2150   PHURINE 7.0 07/29/2022 2150   GLUCOSEU NEGATIVE 07/29/2022 2150   HGBUR MODERATE (A) 07/29/2022 2150   HGBUR small 03/18/2010 1140   BILIRUBINUR NEGATIVE 07/29/2022 2150   BILIRUBINUR Negative 01/06/2021 1143   KETONESUR 5 (A) 07/29/2022 2150   PROTEINUR 100 (A) 07/29/2022 2150   UROBILINOGEN 1.0 11/01/2019 1614   UROBILINOGEN 0.2 03/18/2010 1140   NITRITE POSITIVE (A) 07/29/2022 2150   LEUKOCYTESUR MODERATE (A) 07/29/2022 2150   Sepsis Labs Recent Labs  Lab 12/07/22 1927 12/08/22 0500 12/08/22 0923  WBC 12.9* 10.3 8.9   Microbiology No results found for this or any previous visit (from the past 240 hour(s)).   Time coordinating discharge:  I have spent 35 minutes face to face with the patient and on the ward discussing the patients care, assessment, plan and disposition  with other care givers. >50% of the time was devoted counseling the patient about the risks and benefits of treatment/Discharge disposition and coordinating care.   SIGNED:   Miguel Rota, MD  [facility] 12/09/2022, 9:46 AM   If 7PM-7AM, please contact night-coverage

## 2022-12-09 NOTE — Discharge Instructions (Signed)
Clean bilateral leg ulcers with NS, apply silver hydrofiber [place] [id number]) to wound beds daily, cover with ABD pad and wrap with Kerlix roll gauze beginning just above toes and ending right below knees.  Secure dressings with Ace bandage wrapped in same fashion as Kerlix for light compression.     Patient should resume compression wraps by home health at discharge and keep appointment with wound care center 12/21/2022 for these chronic ulcers.

## 2022-12-09 NOTE — Progress Notes (Signed)
Ng Discharge Note  Admit Date:  12/07/2022 Discharge date: 12/09/2022   ARITHA MOSTYN to be D/C'd Home per MD order.  AVS completed. Patient/caregiver able to verbalize understanding.  Discharge Medication: Allergies as of 12/09/2022       Reactions   Cellcept [mycophenolate Mofetil] Other (See Comments)   Myalgias Mouth sores Difficulty swallowing Bruising   Cipro [ciprofloxacin Hcl] Other (See Comments)   Chills Sick   Statins Other (See Comments)   Dx of in   Hydrocodone Nausea Only   Penicillins Rash        Medication List     STOP taking these medications    doxycycline 100 MG tablet Commonly known as: VIBRA-TABS   predniSONE 5 MG tablet Commonly known as: DELTASONE       TAKE these medications    acetaminophen 650 MG CR tablet Commonly known as: TYLENOL Take 650 mg by mouth every 8 (eight) hours as needed for pain or fever.   cholecalciferol 25 MCG (1000 UNIT) tablet Commonly known as: VITAMIN D3 Take 1 tablet (1,000 Units total) by mouth daily.   clotrimazole-betamethasone cream Commonly known as: LOTRISONE Apply 1 Application topically 2 (two) times daily. What changed:  when to take this reasons to take this   Eliquis 5 MG Tabs tablet Generic drug: apixaban TAKE 1 TABLET TWICE DAILY   ezetimibe 10 MG tablet Commonly known as: ZETIA TAKE 1 TABLET EVERY DAY   folic acid 1 MG tablet Commonly known as: FOLVITE TAKE 1 TABLET EVERY DAY   furosemide 20 MG tablet Commonly known as: LASIX TAKE 1 TABLET EVERY DAY   gabapentin 100 MG capsule Commonly known as: NEURONTIN TAKE 1 CAPSULE EVERY MORNING AND TAKE 2 CAPSULES AT BEDTIME   magnesium oxide 400 (240 Mg) MG tablet Commonly known as: MAG-OX TAKE 1 TABLET TWICE DAILY What changed: when to take this   methotrexate 2.5 MG tablet Commonly known as: RHEUMATREX Take 5 tablets (12.5 mg total) by mouth once a week. Caution:Chemotherapy. Protect from light. What changed:  when to take  this additional instructions   mirabegron ER 25 MG Tb24 tablet Commonly known as: MYRBETRIQ Take 1 tablet (25 mg total) by mouth daily.   ofloxacin 0.3 % ophthalmic solution Commonly known as: OCUFLOX Place 1 drop into the left eye 4 (four) times daily.   oxyCODONE 5 MG immediate release tablet Commonly known as: Oxy IR/ROXICODONE Take 1 tablet (5 mg total) by mouth every 6 (six) hours as needed for severe pain or breakthrough pain.   prednisoLONE acetate 1 % ophthalmic suspension Commonly known as: PRED FORTE Place 1 drop into the left eye 3 (three) times daily.   senna-docusate 8.6-50 MG tablet Commonly known as: Senokot-S Take 2 tablets by mouth at bedtime as needed for moderate constipation.   terbinafine 250 MG tablet Commonly known as: LAMISIL Take 1 tablet (250 mg total) by mouth daily.        Discharge Assessment: Vitals:   12/08/22 2057 12/09/22 0356  BP: 134/60 114/60  Pulse: 98 95  Resp:  18  Temp: 98.2 F (36.8 C) 98.3 F (36.8 C)  SpO2: 98% 100%   Skin clean, dry and intact without evidence of skin break down, no evidence of skin tears noted. IV catheter discontinued intact. Site without signs and symptoms of complications - no redness or edema noted at insertion site, patient denies c/o pain - only slight tenderness at site.  Dressing with slight pressure applied.  D/c Instructions-Education: Discharge instructions  given to patient/family with verbalized understanding. D/c education completed with patient/family including follow up instructions, medication list, d/c activities limitations if indicated, with other d/c instructions as indicated by MD - patient able to verbalize understanding, all questions fully answered. Patient instructed to return to ED, call 911, or call MD for any changes in condition.  Patient escorted via WC, and D/C home via private auto.  Cristal Ford, LPN 7/42/5956 38:75 PM

## 2022-12-10 ENCOUNTER — Ambulatory Visit (HOSPITAL_COMMUNITY): Payer: Medicare HMO | Admitting: Physical Therapy

## 2022-12-11 DIAGNOSIS — I890 Lymphedema, not elsewhere classified: Secondary | ICD-10-CM | POA: Diagnosis not present

## 2022-12-11 DIAGNOSIS — M1611 Unilateral primary osteoarthritis, right hip: Secondary | ICD-10-CM | POA: Diagnosis not present

## 2022-12-11 DIAGNOSIS — L97221 Non-pressure chronic ulcer of left calf limited to breakdown of skin: Secondary | ICD-10-CM | POA: Diagnosis not present

## 2022-12-11 DIAGNOSIS — M48061 Spinal stenosis, lumbar region without neurogenic claudication: Secondary | ICD-10-CM | POA: Diagnosis not present

## 2022-12-11 DIAGNOSIS — I872 Venous insufficiency (chronic) (peripheral): Secondary | ICD-10-CM | POA: Diagnosis not present

## 2022-12-11 DIAGNOSIS — M069 Rheumatoid arthritis, unspecified: Secondary | ICD-10-CM | POA: Diagnosis not present

## 2022-12-11 DIAGNOSIS — I739 Peripheral vascular disease, unspecified: Secondary | ICD-10-CM | POA: Diagnosis not present

## 2022-12-11 DIAGNOSIS — I080 Rheumatic disorders of both mitral and aortic valves: Secondary | ICD-10-CM | POA: Diagnosis not present

## 2022-12-11 DIAGNOSIS — I119 Hypertensive heart disease without heart failure: Secondary | ICD-10-CM | POA: Diagnosis not present

## 2022-12-14 ENCOUNTER — Ambulatory Visit (HOSPITAL_COMMUNITY): Payer: Medicare HMO | Admitting: Physical Therapy

## 2022-12-14 DIAGNOSIS — I080 Rheumatic disorders of both mitral and aortic valves: Secondary | ICD-10-CM | POA: Diagnosis not present

## 2022-12-14 DIAGNOSIS — M069 Rheumatoid arthritis, unspecified: Secondary | ICD-10-CM | POA: Diagnosis not present

## 2022-12-14 DIAGNOSIS — I119 Hypertensive heart disease without heart failure: Secondary | ICD-10-CM | POA: Diagnosis not present

## 2022-12-14 DIAGNOSIS — M1611 Unilateral primary osteoarthritis, right hip: Secondary | ICD-10-CM | POA: Diagnosis not present

## 2022-12-14 DIAGNOSIS — L97221 Non-pressure chronic ulcer of left calf limited to breakdown of skin: Secondary | ICD-10-CM | POA: Diagnosis not present

## 2022-12-14 DIAGNOSIS — I872 Venous insufficiency (chronic) (peripheral): Secondary | ICD-10-CM | POA: Diagnosis not present

## 2022-12-14 DIAGNOSIS — I890 Lymphedema, not elsewhere classified: Secondary | ICD-10-CM | POA: Diagnosis not present

## 2022-12-14 DIAGNOSIS — M48061 Spinal stenosis, lumbar region without neurogenic claudication: Secondary | ICD-10-CM | POA: Diagnosis not present

## 2022-12-14 DIAGNOSIS — I739 Peripheral vascular disease, unspecified: Secondary | ICD-10-CM | POA: Diagnosis not present

## 2022-12-16 DIAGNOSIS — I080 Rheumatic disorders of both mitral and aortic valves: Secondary | ICD-10-CM | POA: Diagnosis not present

## 2022-12-16 DIAGNOSIS — I890 Lymphedema, not elsewhere classified: Secondary | ICD-10-CM | POA: Diagnosis not present

## 2022-12-16 DIAGNOSIS — I872 Venous insufficiency (chronic) (peripheral): Secondary | ICD-10-CM | POA: Diagnosis not present

## 2022-12-16 DIAGNOSIS — L97221 Non-pressure chronic ulcer of left calf limited to breakdown of skin: Secondary | ICD-10-CM | POA: Diagnosis not present

## 2022-12-16 DIAGNOSIS — M069 Rheumatoid arthritis, unspecified: Secondary | ICD-10-CM | POA: Diagnosis not present

## 2022-12-16 DIAGNOSIS — I739 Peripheral vascular disease, unspecified: Secondary | ICD-10-CM | POA: Diagnosis not present

## 2022-12-16 DIAGNOSIS — M1611 Unilateral primary osteoarthritis, right hip: Secondary | ICD-10-CM | POA: Diagnosis not present

## 2022-12-16 DIAGNOSIS — I119 Hypertensive heart disease without heart failure: Secondary | ICD-10-CM | POA: Diagnosis not present

## 2022-12-16 DIAGNOSIS — M48061 Spinal stenosis, lumbar region without neurogenic claudication: Secondary | ICD-10-CM | POA: Diagnosis not present

## 2022-12-16 NOTE — Addendum Note (Signed)
Addended by: Ferdinand Lango on: 12/16/2022 11:45 AM   Modules accepted: Level of Service

## 2022-12-17 ENCOUNTER — Ambulatory Visit (HOSPITAL_COMMUNITY): Payer: Medicare HMO

## 2022-12-18 DIAGNOSIS — L97221 Non-pressure chronic ulcer of left calf limited to breakdown of skin: Secondary | ICD-10-CM | POA: Diagnosis not present

## 2022-12-18 DIAGNOSIS — I739 Peripheral vascular disease, unspecified: Secondary | ICD-10-CM | POA: Diagnosis not present

## 2022-12-18 DIAGNOSIS — M1611 Unilateral primary osteoarthritis, right hip: Secondary | ICD-10-CM | POA: Diagnosis not present

## 2022-12-18 DIAGNOSIS — I872 Venous insufficiency (chronic) (peripheral): Secondary | ICD-10-CM | POA: Diagnosis not present

## 2022-12-18 DIAGNOSIS — M069 Rheumatoid arthritis, unspecified: Secondary | ICD-10-CM | POA: Diagnosis not present

## 2022-12-18 DIAGNOSIS — I119 Hypertensive heart disease without heart failure: Secondary | ICD-10-CM | POA: Diagnosis not present

## 2022-12-18 DIAGNOSIS — I890 Lymphedema, not elsewhere classified: Secondary | ICD-10-CM | POA: Diagnosis not present

## 2022-12-18 DIAGNOSIS — M48061 Spinal stenosis, lumbar region without neurogenic claudication: Secondary | ICD-10-CM | POA: Diagnosis not present

## 2022-12-18 DIAGNOSIS — I080 Rheumatic disorders of both mitral and aortic valves: Secondary | ICD-10-CM | POA: Diagnosis not present

## 2022-12-21 ENCOUNTER — Telehealth: Payer: Self-pay | Admitting: Family Medicine

## 2022-12-21 ENCOUNTER — Ambulatory Visit (HOSPITAL_COMMUNITY): Payer: Medicare HMO | Admitting: Physical Therapy

## 2022-12-21 DIAGNOSIS — I080 Rheumatic disorders of both mitral and aortic valves: Secondary | ICD-10-CM | POA: Diagnosis not present

## 2022-12-21 DIAGNOSIS — I739 Peripheral vascular disease, unspecified: Secondary | ICD-10-CM | POA: Diagnosis not present

## 2022-12-21 DIAGNOSIS — L97221 Non-pressure chronic ulcer of left calf limited to breakdown of skin: Secondary | ICD-10-CM | POA: Diagnosis not present

## 2022-12-21 DIAGNOSIS — M069 Rheumatoid arthritis, unspecified: Secondary | ICD-10-CM | POA: Diagnosis not present

## 2022-12-21 DIAGNOSIS — M1611 Unilateral primary osteoarthritis, right hip: Secondary | ICD-10-CM | POA: Diagnosis not present

## 2022-12-21 DIAGNOSIS — I872 Venous insufficiency (chronic) (peripheral): Secondary | ICD-10-CM | POA: Diagnosis not present

## 2022-12-21 DIAGNOSIS — I119 Hypertensive heart disease without heart failure: Secondary | ICD-10-CM | POA: Diagnosis not present

## 2022-12-21 DIAGNOSIS — I890 Lymphedema, not elsewhere classified: Secondary | ICD-10-CM | POA: Diagnosis not present

## 2022-12-21 DIAGNOSIS — M48061 Spinal stenosis, lumbar region without neurogenic claudication: Secondary | ICD-10-CM | POA: Diagnosis not present

## 2022-12-21 NOTE — Telephone Encounter (Signed)
Verbal order given  

## 2022-12-21 NOTE — Telephone Encounter (Signed)
Tabitha from [facility] called patient has a new stage 2 pressure ulcer sore on sacrum area needs orders to treat the wound and also get orders for physical therapy and occupational therapy to evaluate due to declining. Call back # [phone]

## 2022-12-22 NOTE — Progress Notes (Signed)
This encounter was created in error - please disregard.

## 2022-12-23 DIAGNOSIS — I119 Hypertensive heart disease without heart failure: Secondary | ICD-10-CM | POA: Diagnosis not present

## 2022-12-23 DIAGNOSIS — M069 Rheumatoid arthritis, unspecified: Secondary | ICD-10-CM | POA: Diagnosis not present

## 2022-12-23 DIAGNOSIS — I080 Rheumatic disorders of both mitral and aortic valves: Secondary | ICD-10-CM | POA: Diagnosis not present

## 2022-12-23 DIAGNOSIS — M48061 Spinal stenosis, lumbar region without neurogenic claudication: Secondary | ICD-10-CM | POA: Diagnosis not present

## 2022-12-23 DIAGNOSIS — M1611 Unilateral primary osteoarthritis, right hip: Secondary | ICD-10-CM | POA: Diagnosis not present

## 2022-12-23 DIAGNOSIS — L97221 Non-pressure chronic ulcer of left calf limited to breakdown of skin: Secondary | ICD-10-CM | POA: Diagnosis not present

## 2022-12-23 DIAGNOSIS — I739 Peripheral vascular disease, unspecified: Secondary | ICD-10-CM | POA: Diagnosis not present

## 2022-12-23 DIAGNOSIS — I872 Venous insufficiency (chronic) (peripheral): Secondary | ICD-10-CM | POA: Diagnosis not present

## 2022-12-23 DIAGNOSIS — I890 Lymphedema, not elsewhere classified: Secondary | ICD-10-CM | POA: Diagnosis not present

## 2022-12-24 ENCOUNTER — Emergency Department (HOSPITAL_COMMUNITY): Payer: Medicare HMO

## 2022-12-24 ENCOUNTER — Inpatient Hospital Stay (HOSPITAL_COMMUNITY)
Admission: EM | Admit: 2022-12-24 | Discharge: 2022-12-29 | DRG: 177 | Disposition: A | Discharge status: Skilled Nursing Facility | Payer: Medicare HMO | Attending: Internal Medicine | Admitting: Internal Medicine

## 2022-12-24 ENCOUNTER — Other Ambulatory Visit: Payer: Self-pay

## 2022-12-24 ENCOUNTER — Encounter (HOSPITAL_COMMUNITY): Payer: Self-pay

## 2022-12-24 ENCOUNTER — Ambulatory Visit (HOSPITAL_COMMUNITY): Payer: Medicare HMO | Admitting: Physical Therapy

## 2022-12-24 DIAGNOSIS — I872 Venous insufficiency (chronic) (peripheral): Secondary | ICD-10-CM | POA: Diagnosis present

## 2022-12-24 DIAGNOSIS — K7581 Nonalcoholic steatohepatitis (NASH): Secondary | ICD-10-CM | POA: Diagnosis present

## 2022-12-24 DIAGNOSIS — Z8249 Family history of ischemic heart disease and other diseases of the circulatory system: Secondary | ICD-10-CM | POA: Diagnosis not present

## 2022-12-24 DIAGNOSIS — R059 Cough, unspecified: Secondary | ICD-10-CM | POA: Diagnosis not present

## 2022-12-24 DIAGNOSIS — N390 Urinary tract infection, site not specified: Secondary | ICD-10-CM | POA: Diagnosis present

## 2022-12-24 DIAGNOSIS — E7800 Pure hypercholesterolemia, unspecified: Secondary | ICD-10-CM | POA: Diagnosis not present

## 2022-12-24 DIAGNOSIS — R7989 Other specified abnormal findings of blood chemistry: Secondary | ICD-10-CM | POA: Diagnosis not present

## 2022-12-24 DIAGNOSIS — I110 Hypertensive heart disease with heart failure: Secondary | ICD-10-CM | POA: Diagnosis not present

## 2022-12-24 DIAGNOSIS — E876 Hypokalemia: Secondary | ICD-10-CM | POA: Diagnosis present

## 2022-12-24 DIAGNOSIS — L97222 Non-pressure chronic ulcer of left calf with fat layer exposed: Secondary | ICD-10-CM | POA: Diagnosis not present

## 2022-12-24 DIAGNOSIS — Z860100 Personal history of colon polyps, unspecified: Secondary | ICD-10-CM | POA: Diagnosis not present

## 2022-12-24 DIAGNOSIS — R627 Adult failure to thrive: Principal | ICD-10-CM | POA: Diagnosis present

## 2022-12-24 DIAGNOSIS — I5032 Chronic diastolic (congestive) heart failure: Secondary | ICD-10-CM | POA: Diagnosis not present

## 2022-12-24 DIAGNOSIS — R0902 Hypoxemia: Secondary | ICD-10-CM | POA: Diagnosis not present

## 2022-12-24 DIAGNOSIS — L89153 Pressure ulcer of sacral region, stage 3: Secondary | ICD-10-CM | POA: Diagnosis present

## 2022-12-24 DIAGNOSIS — I700 Atherosclerosis of aorta: Secondary | ICD-10-CM | POA: Diagnosis not present

## 2022-12-24 DIAGNOSIS — R269 Unspecified abnormalities of gait and mobility: Secondary | ICD-10-CM | POA: Diagnosis present

## 2022-12-24 DIAGNOSIS — R600 Localized edema: Secondary | ICD-10-CM | POA: Diagnosis present

## 2022-12-24 DIAGNOSIS — Z8261 Family history of arthritis: Secondary | ICD-10-CM

## 2022-12-24 DIAGNOSIS — K7689 Other specified diseases of liver: Secondary | ICD-10-CM | POA: Diagnosis not present

## 2022-12-24 DIAGNOSIS — Z90710 Acquired absence of both cervix and uterus: Secondary | ICD-10-CM | POA: Diagnosis not present

## 2022-12-24 DIAGNOSIS — R5381 Other malaise: Secondary | ICD-10-CM | POA: Diagnosis not present

## 2022-12-24 DIAGNOSIS — Z833 Family history of diabetes mellitus: Secondary | ICD-10-CM | POA: Diagnosis not present

## 2022-12-24 DIAGNOSIS — Z882 Allergy status to sulfonamides status: Secondary | ICD-10-CM

## 2022-12-24 DIAGNOSIS — Z888 Allergy status to other drugs, medicaments and biological substances status: Secondary | ICD-10-CM

## 2022-12-24 DIAGNOSIS — U071 COVID-19: Principal | ICD-10-CM | POA: Diagnosis present

## 2022-12-24 DIAGNOSIS — M7989 Other specified soft tissue disorders: Secondary | ICD-10-CM | POA: Diagnosis present

## 2022-12-24 DIAGNOSIS — R531 Weakness: Secondary | ICD-10-CM | POA: Diagnosis present

## 2022-12-24 DIAGNOSIS — Z6821 Body mass index (BMI) 21.0-21.9, adult: Secondary | ICD-10-CM

## 2022-12-24 DIAGNOSIS — Z880 Allergy status to penicillin: Secondary | ICD-10-CM

## 2022-12-24 DIAGNOSIS — Z79899 Other long term (current) drug therapy: Secondary | ICD-10-CM | POA: Diagnosis not present

## 2022-12-24 DIAGNOSIS — Z8052 Family history of malignant neoplasm of bladder: Secondary | ICD-10-CM | POA: Diagnosis not present

## 2022-12-24 DIAGNOSIS — Z66 Do not resuscitate: Secondary | ICD-10-CM | POA: Diagnosis not present

## 2022-12-24 DIAGNOSIS — Z8542 Personal history of malignant neoplasm of other parts of uterus: Secondary | ICD-10-CM | POA: Diagnosis not present

## 2022-12-24 DIAGNOSIS — Z602 Problems related to living alone: Secondary | ICD-10-CM | POA: Diagnosis present

## 2022-12-24 DIAGNOSIS — I10 Essential (primary) hypertension: Secondary | ICD-10-CM | POA: Diagnosis not present

## 2022-12-24 DIAGNOSIS — Z885 Allergy status to narcotic agent status: Secondary | ICD-10-CM

## 2022-12-24 DIAGNOSIS — G629 Polyneuropathy, unspecified: Secondary | ICD-10-CM | POA: Diagnosis present

## 2022-12-24 DIAGNOSIS — Z7901 Long term (current) use of anticoagulants: Secondary | ICD-10-CM | POA: Diagnosis not present

## 2022-12-24 DIAGNOSIS — Z86718 Personal history of other venous thrombosis and embolism: Secondary | ICD-10-CM | POA: Diagnosis not present

## 2022-12-24 DIAGNOSIS — L97822 Non-pressure chronic ulcer of other part of left lower leg with fat layer exposed: Secondary | ICD-10-CM | POA: Diagnosis not present

## 2022-12-24 DIAGNOSIS — J90 Pleural effusion, not elsewhere classified: Secondary | ICD-10-CM | POA: Diagnosis not present

## 2022-12-24 DIAGNOSIS — I2510 Atherosclerotic heart disease of native coronary artery without angina pectoris: Secondary | ICD-10-CM | POA: Diagnosis not present

## 2022-12-24 LAB — CBC WITH DIFFERENTIAL/PLATELET
Abs Immature Granulocytes: 0.03 10*3/uL (ref 0.00–0.07)
Basophils Absolute: 0 10*3/uL (ref 0.0–0.1)
Basophils Relative: 0 %
Eosinophils Absolute: 0.1 10*3/uL (ref 0.0–0.5)
Eosinophils Relative: 2 %
HCT: 35.4 % — ABNORMAL LOW (ref 36.0–46.0)
Hemoglobin: 10.6 g/dL — ABNORMAL LOW (ref 12.0–15.0)
Immature Granulocytes: 1 %
Lymphocytes Relative: 17 %
Lymphs Abs: 0.9 10*3/uL (ref 0.7–4.0)
MCH: 29.9 pg (ref 26.0–34.0)
MCHC: 29.9 g/dL — ABNORMAL LOW (ref 30.0–36.0)
MCV: 99.7 fL (ref 80.0–100.0)
Monocytes Absolute: 0.3 10*3/uL (ref 0.1–1.0)
Monocytes Relative: 6 %
Neutro Abs: 4.1 10*3/uL (ref 1.7–7.7)
Neutrophils Relative %: 74 %
Platelets: 226 10*3/uL (ref 150–400)
RBC: 3.55 MIL/uL — ABNORMAL LOW (ref 3.87–5.11)
RDW: 15.6 % — ABNORMAL HIGH (ref 11.5–15.5)
WBC: 5.4 10*3/uL (ref 4.0–10.5)
nRBC: 0 % (ref 0.0–0.2)

## 2022-12-24 LAB — COMPREHENSIVE METABOLIC PANEL
ALT: 16 U/L (ref 0–44)
AST: 27 U/L (ref 15–41)
Albumin: 3 g/dL — ABNORMAL LOW (ref 3.5–5.0)
Alkaline Phosphatase: 46 U/L (ref 38–126)
Anion gap: 11 (ref 5–15)
BUN: 20 mg/dL (ref 8–23)
CO2: 28 mmol/L (ref 22–32)
Calcium: 8.6 mg/dL — ABNORMAL LOW (ref 8.9–10.3)
Chloride: 101 mmol/L (ref 98–111)
Creatinine, Ser: 0.83 mg/dL (ref 0.44–1.00)
GFR, Estimated: >60 mL/min (ref >60)
Glucose, Bld: 100 mg/dL — ABNORMAL HIGH (ref 70–99)
Potassium: 3.1 mmol/L — ABNORMAL LOW (ref 3.5–5.1)
Sodium: 140 mmol/L (ref 135–145)
Total Bilirubin: 0.9 mg/dL (ref 0.3–1.2)
Total Protein: 6.7 g/dL (ref 6.5–8.1)

## 2022-12-24 LAB — URINALYSIS, ROUTINE W REFLEX MICROSCOPIC
Bacteria, UA: NONE SEEN
Bilirubin Urine: NEGATIVE
Glucose, UA: NEGATIVE mg/dL
Hgb urine dipstick: NEGATIVE
Ketones, ur: 20 mg/dL — AB
Leukocytes,Ua: NEGATIVE
Nitrite: NEGATIVE
Protein, ur: 100 mg/dL — AB
Specific Gravity, Urine: 1.021 (ref 1.005–1.030)
pH: 5 (ref 5.0–8.0)

## 2022-12-24 LAB — MAGNESIUM: Magnesium: 2.3 mg/dL (ref 1.7–2.4)

## 2022-12-24 LAB — SARS CORONAVIRUS 2 BY RT PCR: SARS Coronavirus 2 by RT PCR: POSITIVE — AB

## 2022-12-24 MED ORDER — APIXABAN 5 MG PO TABS
5.0000 mg | ORAL_TABLET | Freq: Two times a day (BID) | ORAL | Status: DC
  Administered 2022-12-24 – 2022-12-29 (×10): 5 mg via ORAL
  Filled 2022-12-24 (×10): qty 1

## 2022-12-24 MED ORDER — POTASSIUM CHLORIDE CRYS ER 20 MEQ PO TBCR
40.0000 meq | EXTENDED_RELEASE_TABLET | Freq: Once | ORAL | Status: AC
  Administered 2022-12-24: 40 meq via ORAL
  Filled 2022-12-24: qty 2

## 2022-12-24 MED ORDER — MIRABEGRON ER 25 MG PO TB24
25.0000 mg | ORAL_TABLET | Freq: Every day | ORAL | Status: DC
  Administered 2022-12-25 – 2022-12-29 (×5): 25 mg via ORAL
  Filled 2022-12-24 (×5): qty 1

## 2022-12-24 MED ORDER — GABAPENTIN 100 MG PO CAPS
100.0000 mg | ORAL_CAPSULE | Freq: Two times a day (BID) | ORAL | Status: DC
  Administered 2022-12-24 – 2022-12-26 (×5): 100 mg via ORAL
  Administered 2022-12-27 – 2022-12-28 (×3): 200 mg via ORAL
  Administered 2022-12-28: 100 mg via ORAL
  Administered 2022-12-29: 200 mg via ORAL
  Filled 2022-12-24: qty 1
  Filled 2022-12-24 (×2): qty 2
  Filled 2022-12-24 (×2): qty 1
  Filled 2022-12-24: qty 2
  Filled 2022-12-24 (×2): qty 1
  Filled 2022-12-24: qty 2
  Filled 2022-12-24: qty 1

## 2022-12-24 MED ORDER — ONDANSETRON HCL 4 MG/2ML IJ SOLN: 4.0000 mg | Freq: Four times a day (QID) | INTRAMUSCULAR | Status: DC | PRN

## 2022-12-24 MED ORDER — ONDANSETRON HCL 4 MG PO TABS: 4.0000 mg | ORAL_TABLET | Freq: Four times a day (QID) | ORAL | Status: DC | PRN

## 2022-12-24 MED ORDER — FUROSEMIDE 20 MG PO TABS
20.0000 mg | ORAL_TABLET | ORAL | Status: DC
  Administered 2022-12-25 – 2022-12-29 (×3): 20 mg via ORAL
  Filled 2022-12-24 (×4): qty 1

## 2022-12-24 MED ORDER — GUAIFENESIN-DM 100-10 MG/5ML PO SYRP: 5.0000 mL | ORAL_SOLUTION | Freq: Four times a day (QID) | ORAL | Status: DC | PRN

## 2022-12-24 MED ORDER — ACETAMINOPHEN 650 MG RE SUPP: 650.0000 mg | Freq: Four times a day (QID) | RECTAL | Status: DC | PRN

## 2022-12-24 MED ORDER — POLYETHYLENE GLYCOL 3350 17 G PO PACK: 17.0000 g | PACK | Freq: Every day | ORAL | Status: DC | PRN

## 2022-12-24 MED ORDER — FOLIC ACID 1 MG PO TABS
1.0000 mg | ORAL_TABLET | Freq: Every day | ORAL | Status: DC
  Administered 2022-12-25 – 2022-12-29 (×5): 1 mg via ORAL
  Filled 2022-12-24 (×5): qty 1

## 2022-12-24 MED ORDER — ACETAMINOPHEN 325 MG PO TABS
650.0000 mg | ORAL_TABLET | Freq: Four times a day (QID) | ORAL | Status: DC | PRN
  Administered 2022-12-24 – 2022-12-27 (×4): 650 mg via ORAL
  Filled 2022-12-24 (×4): qty 2

## 2022-12-24 NOTE — Assessment & Plan Note (Signed)
Reports mild cough without dyspnea.  Chest x-ray negative for acute abnormality.  On room air, sat greater than 97%. - Supportive care -Obtain inflammatory panel, repeat in a.m.

## 2022-12-24 NOTE — ED Notes (Signed)
With EDP permission, Pt given water, peanut butter, saltine crackers, graham crackers, and apple sauce.   Pt's female visitor reports she hasn't eaten in 2 days.

## 2022-12-24 NOTE — ED Notes (Signed)
Pt's female visitor came to nurses' station looking for her "necklace."  Upon clarification, the "necklace" is actually a life alert necklace.  No necklace was found in her room or belongings bag.  This Clinical research associate confirmed w/ NTs and Xray tech that no necklace had been seen.  Pt asked if she was sure she had it upon arrival.  Pt reported "I'm not sure of anything."

## 2022-12-24 NOTE — Assessment & Plan Note (Addendum)
Stable. -Resume Lasix 20 mg as patient takes it every other day

## 2022-12-24 NOTE — ED Notes (Signed)
Per pt sister Bonita Quin at bedside, any questions about medication can be answered by patients home health aid [facility] at [phone]

## 2022-12-24 NOTE — Assessment & Plan Note (Signed)
Resume Eliquis 

## 2022-12-24 NOTE — ED Provider Triage Note (Signed)
Emergency Medicine Provider Triage Evaluation Note  [name] [name] , a 89 y.o. female  was evaluated in triage.  Pt complains of "I do not know why I am here" she states she was diagnosed with COVID this morning, having some sacral pain in the area of her ulcer but denies any specific complaints..  Review of Systems  Positive: Pain at sacral ulcer Negative: Fever, sob, chest pain  Physical Exam  BP (!) 150/75   Pulse 83   Temp 98.4 F (36.9 C) (Oral)   Resp 17   Wt 64 kg   SpO2 97%   BMI 22.77 kg/m  Gen:   Awake, no distress   Resp:  Normal effort  MSK:   Moves extremities without difficulty  Other:    Medical Decision Making  Medically screening exam initiated at 11:40 AM.  Appropriate orders placed.  [name] [name] was informed that the remainder of the evaluation will be completed by another provider, this initial triage assessment does not replace that evaluation, and the importance of remaining in the ED until their evaluation is complete.     Ma Rings, [place] 12/24/22 1148

## 2022-12-24 NOTE — ED Notes (Signed)
ED TO INPATIENT HANDOFF REPORT  ED Nurse Name and Phone #: Landry Dyke Name/Age/Gender [name] [name] 89 y.o. female Room/Bed: APA05/APA05  Code Status   Code Status: Prior  Home/SNF/Other Home Patient oriented to: self, place, time, and situation Is this baseline? Yes   Triage Complete: Triage complete  Chief Complaint Failure to thrive in adult [R62.7]  Triage Note BIBA with c/o lower extremity swelling, pressure ulcer to sacrum, and diagnosed with covid yesterday.   Ems reports that family would like placement.  Pt states "I don't know why I am here."    Allergies Allergies  Allergen Reactions   Ciprofloxacin Hcl Other (See Comments)    Chills, Sick   Mycophenolate Mofetil Other (See Comments)    Myalgias, Mouth sores, Difficulty swallowing, Bruising   Statins Other (See Comments)    Dx of in   Hydrocodone Nausea Only   Penicillins Rash    Level of Care/Admitting Diagnosis ED Disposition     ED Disposition  Admit   Condition  --   Comment  Hospital Area: [facility] [[id number]]  Level of Care: Telemetry [5]  Covid Evaluation: Asymptomatic - no recent exposure (last 10 days) testing not required  Diagnosis: Failure to thrive in adult [358490]  Admitting Physician: Onnie Boer [[id number]]  Attending Physician: Cresenciano Lick          B Medical/Surgery History Past Medical History:  Diagnosis Date   Acute deep vein thrombosis (DVT) of left femoral vein (HCC) 02/07/2019   Dx 02/05/2019   Arthritis    spinal stenosis   Deep vein thrombosis (DVT) of left lower extremity (HCC) 08/04/2019   Elevated LFTs    secondary to fatty liver, negative work-up in 2011   Hypercholesterolemia    Hypertension    Lumbar spinal stenosis 01/14/2018   L3-4 and L4-5   Myopathy 02/15/2018   Osteoarthritis    right knee   Peripheral neuropathy 07/06/2018   PONV (postoperative nausea and vomiting)    Uterine cancer (HCC) 08/2006   grade 1, no  recurrence up to 2013   Past Surgical History:  Procedure Laterality Date   ABDOMINAL HYSTERECTOMY  2008   adenocarcinoma stage 1   APPENDECTOMY  1973   BREAST BIOPSY Left 2018   Benign   CHOLECYSTECTOMY  1973   COLONOSCOPY    06/21/2007   JXB:JYNWGN rectum/Sigmoid diverticula, diminutive hepatic flexure polyp s/p bx. Benign.    COLONOSCOPY N/A 10/17/2012   Dr. Darrick Penna- moderate diverticulosis was noted in the sigmoid colon, moderate sized internal hemorrhoids. next tcs in10 years   ESOPHAGOGASTRODUODENOSCOPY N/A 03/20/2016   Dr. Darrick Penna, widely patent Schatzki ring, anemia due to ASA induced erosive gastritis, mild duodenitis. gastric bx benign without H.pylori.    FOOT SURGERY  2007   Pins in toes on left foot, 5 to 6 yrs ago   GIVENS CAPSULE STUDY N/A 03/20/2016   Procedure: GIVENS CAPSULE STUDY;  Surgeon: West Bali, MD;  [facility];  Service: Endoscopy;  Laterality: N/A;   MUSCLE BIOPSY Left 06/07/2018   Procedure: LEFT THIGH MUSCLE BIOPSY;  Surgeon: Harriette Bouillon, MD;  Location: [facility];  Service: General;  Laterality: Left;   TONSILLECTOMY       A IV Location/Drains/Wounds Patient Lines/Drains/Airways Status     Active Line/Drains/Airways     Name Placement date Placement time Site Days   Peripheral IV 12/24/22 22 G Left Antecubital 12/24/22  1933  Antecubital  less  than 1   Pressure Injury 12/08/22 Sacrum Medial Stage 2 -  Partial thickness loss of dermis presenting as a shallow open injury with a red, pink wound bed without slough. pink wound bed with irregular edges and no drainage 12/08/22  0110  -- 16   Wound / Incision (Open or Dehisced) 12/08/22 Venous stasis ulcer Pretibial Distal;Right;Circumferential scattered unnatached areas of skin 12/08/22  0110  Pretibial  16   Wound / Incision (Open or Dehisced) 12/08/22 Venous stasis ulcer Pretibial Distal;Left;Circumferential scattered areas of non-intact skin 12/08/22  0110  Pretibial  16             Intake/Output Last 24 hours No intake or output data in the 24 hours ending 12/24/22 1935  Labs/Imaging Results for orders placed or performed during the hospital encounter of 12/24/22 (from the past 48 hour(s))  Urinalysis, Routine w reflex microscopic -Urine, Clean Catch     Status: Abnormal   Collection Time: 12/24/22 11:49 AM  Result Value Ref Range   Color, Urine AMBER (A) YELLOW    Comment: BIOCHEMICALS MAY BE AFFECTED BY COLOR   APPearance CLEAR CLEAR   Specific Gravity, Urine 1.021 1.005 - 1.030   pH 5.0 5.0 - 8.0   Glucose, UA NEGATIVE NEGATIVE mg/dL   Hgb urine dipstick NEGATIVE NEGATIVE   Bilirubin Urine NEGATIVE NEGATIVE   Ketones, ur 20 (A) NEGATIVE mg/dL   Protein, ur 161 (A) NEGATIVE mg/dL   Nitrite NEGATIVE NEGATIVE   Leukocytes,Ua NEGATIVE NEGATIVE   RBC / HPF 0-5 0 - 5 RBC/hpf   WBC, UA 0-5 0 - 5 WBC/hpf   Bacteria, UA NONE SEEN NONE SEEN   Squamous Epithelial / HPF 0-5 0 - 5 /HPF   Mucus PRESENT    Hyaline Casts, UA PRESENT     Comment: Performed at [facility], [address], [place], [place] [address]  Comprehensive metabolic panel     Status: Abnormal   Collection Time: 12/24/22 12:33 PM  Result Value Ref Range   Sodium 140 135 - 145 mmol/L   Potassium 3.1 (L) 3.5 - 5.1 mmol/L   Chloride 101 98 - 111 mmol/L   CO2 28 22 - 32 mmol/L   Glucose, Bld 100 (H) 70 - 99 mg/dL    Comment: Glucose reference range applies only to samples taken after fasting for at least 8 hours.   BUN 20 8 - 23 mg/dL   Creatinine, Ser 5.40 0.44 - 1.00 mg/dL   Calcium 8.6 (L) 8.9 - 10.3 mg/dL   Total Protein 6.7 6.5 - 8.1 g/dL   Albumin 3.0 (L) 3.5 - 5.0 g/dL   AST 27 15 - 41 U/L   ALT 16 0 - 44 U/L   Alkaline Phosphatase 46 38 - 126 U/L   Total Bilirubin 0.9 0.3 - 1.2 mg/dL   GFR, Estimated >98 >11 mL/min    Comment: (NOTE) Calculated using the CKD-EPI Creatinine Equation (2021)    Anion gap 11 5 - 15    Comment: Performed at [facility], [address], [place], [place] [address]  CBC with Differential     Status: Abnormal   Collection Time: 12/24/22 12:33 PM  Result Value Ref Range   WBC 5.4 4.0 - 10.5 K/uL   RBC 3.55 (L) 3.87 - 5.11 MIL/uL   Hemoglobin 10.6 (L) 12.0 - 15.0 g/dL   HCT 29.5 (L) 62.1 - 30.8 %   MCV 99.7 80.0 - 100.0 fL   MCH 29.9 26.0 - 34.0  pg   MCHC 29.9 (L) 30.0 - 36.0 g/dL   RDW 40.9 (H) 81.1 - 91.4 %   Platelets 226 150 - 400 K/uL   nRBC 0.0 0.0 - 0.2 %   Neutrophils Relative % 74 %   Neutro Abs 4.1 1.7 - 7.7 K/uL   Lymphocytes Relative 17 %   Lymphs Abs 0.9 0.7 - 4.0 K/uL   Monocytes Relative 6 %   Monocytes Absolute 0.3 0.1 - 1.0 K/uL   Eosinophils Relative 2 %   Eosinophils Absolute 0.1 0.0 - 0.5 K/uL   Basophils Relative 0 %   Basophils Absolute 0.0 0.0 - 0.1 K/uL   Immature Granulocytes 1 %   Abs Immature Granulocytes 0.03 0.00 - 0.07 K/uL    Comment: Performed at [facility], [address] [place], [place] [address]  Magnesium     Status: None   Collection Time: 12/24/22 12:33 PM  Result Value Ref Range   Magnesium 2.3 1.7 - 2.4 mg/dL    Comment: Performed at [facility], [address] [place], [place] [address]  SARS Coronavirus 2 by RT PCR (hospital order, performed in [facility] hospital lab) *cepheid single result test* Anterior Nasal Swab     Status: Abnormal   Collection Time: 12/24/22  4:00 PM   Specimen: Anterior Nasal Swab  Result Value Ref Range   SARS Coronavirus 2 by RT PCR POSITIVE (A) NEGATIVE    Comment: (NOTE) SARS-CoV-2 target nucleic acids are DETECTED  SARS-CoV-2 RNA is generally detectable in upper respiratory specimens  during the acute phase of infection.  Positive results are indicative  of the presence of the identified virus, but do not rule out bacterial infection or co-infection with other pathogens not detected by the test.  Clinical correlation with patient history and  other diagnostic information is necessary to determine patient infection status.  The  expected result is negative.  Fact Sheet for Patients:   RoadLapTop.co.za   Fact Sheet for Healthcare Providers:   http://kim-miller.com/    This test is not yet approved or cleared by the [place] FDA and  has been authorized for detection and/or diagnosis of SARS-CoV-2 by FDA under an Emergency Use Authorization (EUA).  This EUA will remain in effect (meaning this test can be used) for the duration of  the COVID-19 declaration under Section 564(b)(1)  of the Act, 21 U.S.C. section 360-bbb-3(b)(1), unless the authorization is terminated or revoked sooner.   Performed at [facility], [address] [place], [place] [address]    DG Chest Portable 1 View  Result Date: 12/24/2022 CLINICAL DATA:  Cough EXAM: PORTABLE CHEST 1 VIEW COMPARISON:  12/09/2022 FINDINGS: The heart size and mediastinal contours are within normal limits. Both lungs are clear. The visualized skeletal structures are unremarkable. IMPRESSION: No active disease. Electronically Signed   By: Charlett Nose M.D.   On: 12/24/2022 17:40    Pending Labs Unresulted Labs (From admission, onward)    None       Vitals/Pain Today's Vitals   12/24/22 1132 12/24/22 1539 12/24/22 1630 12/24/22 1927  BP: (!) 150/75 (!) 154/86 (!) 149/101 (!) 149/70  Pulse: 83 74 83 78  Resp: 17 (!) 22 16 15   Temp: 98.4 F (36.9 C) 98 F (36.7 C)  97.6 F (36.4 C)  TempSrc: Oral Oral  Oral  SpO2: 97% 97% 98% 100%  Weight:      PainSc:  0-No pain  3     Isolation Precautions No active isolations  Medications Medications  potassium chloride SA (KLOR-CON M) CR tablet 40 mEq (40 mEq Oral Given 12/24/22 1653)    Mobility manual wheelchair     Focused Assessments Cardiac Assessment Handoff:    Lab Results  Component Value Date   CKTOTAL 122 12/25/2021   CKMB 5.6 (H) 09/29/2021   Lab Results  Component Value Date   DDIMER 0.76 (H) 10/07/2021   Does the Patient currently  have chest pain? No    R Recommendations: See Admitting Provider Note  Report given to:   Additional Notes: pt has cellulitis bilateral lower extremities, dressing in tact, covid +, saline lock

## 2022-12-24 NOTE — Assessment & Plan Note (Addendum)
Generalized weakness in the setting of COVID infection.  Poor oral intake unable to ambulate or get out of bed.  Generalized weakness has been progressive, now developed a sacral decubitus ulcer. -Will need placement.

## 2022-12-24 NOTE — ED Triage Notes (Signed)
BIBA with c/o lower extremity swelling, pressure ulcer to sacrum, and diagnosed with covid yesterday.   Ems reports that family would like placement.  Pt states "I don't know why I am here."

## 2022-12-24 NOTE — ED Provider Notes (Signed)
[place] EMERGENCY DEPARTMENT AT [facility] Provider Note   CSN: [id number] Arrival date & time: 12/24/22  1035     History  Chief Complaint  Patient presents with   Leg Swelling    [name] [name] is a 89 y.o. female.  Pt is a 89 yo female with pmhx significant for htn, endometrial cancer, neuropathy, hx DVT (on Eliquis), CHF, peripheral edema and spinal stenosis.  Pt has been weak for the past few weeks.  She has not been able to get up and move and has developed a sacral decub.  She lives alone and has people to help her during the day, but no one at night.  She has refused to go to rehab.  Her son said she has not been taking her Lasix because then she'd have to get up and go to the restroom.  She does have home health who has been wrapping her legs with unna boots every few days.  Pt had a + home covid test this am.  She has been eating very poorly. Sx worse over the last 2 days.       Home Medications Prior to Admission medications   Medication Sig Start Date End Date Taking? Authorizing Provider  nitrofurantoin, macrocrystal-monohydrate, (MACROBID) 100 MG capsule Take 100 mg by mouth 2 (two) times daily. 12/13/22  Yes [provider]  acetaminophen (TYLENOL) 650 MG CR tablet Take 650 mg by mouth every 8 (eight) hours as needed for pain or fever. 08/08/19   [provider]  cholecalciferol (VITAMIN D3) 25 MCG (1000 UNIT) tablet Take 1 tablet (1,000 Units total) by mouth daily. 11/05/22   Kerri Perches, MD  clotrimazole-betamethasone (LOTRISONE) cream Apply 1 Application topically 2 (two) times daily. Patient taking differently: Apply 1 Application topically 2 (two) times daily as needed (rash). 11/10/22   Kerri Perches, MD  ELIQUIS 5 MG TABS tablet TAKE 1 TABLET TWICE DAILY 10/13/22   Doreatha Massed, MD  ezetimibe (ZETIA) 10 MG tablet TAKE 1 TABLET EVERY DAY 03/26/22   Kerri Perches, MD  folic acid (FOLVITE) 1 MG tablet TAKE 1 TABLET  EVERY DAY 10/23/22   Doreatha Massed, MD  furosemide (LASIX) 20 MG tablet TAKE 1 TABLET EVERY DAY 09/16/22   Kerri Perches, MD  gabapentin (NEURONTIN) 100 MG capsule TAKE 1 CAPSULE EVERY MORNING AND TAKE 2 CAPSULES AT BEDTIME 07/20/22   Kerri Perches, MD  magnesium oxide (MAG-OX) 400 (240 Mg) MG tablet TAKE 1 TABLET TWICE DAILY Patient taking differently: Take 1 tablet by mouth daily. 11/30/22   Doreatha Massed, MD  methotrexate 2.5 MG tablet Take 5 tablets (12.5 mg total) by mouth once a week. Caution:Chemotherapy. Protect from light. Patient taking differently: Take 12.5 mg by mouth every Wednesday. 12/25/21   Glean Salvo, NP  mirabegron ER (MYRBETRIQ) 25 MG TB24 tablet Take 1 tablet (25 mg total) by mouth daily. 10/08/22   Kerri Perches, MD  ofloxacin (OCUFLOX) 0.3 % ophthalmic solution Place 1 drop into the left eye 4 (four) times daily. 11/27/22   [provider]  oxyCODONE (OXY IR/ROXICODONE) 5 MG immediate release tablet Take 1 tablet (5 mg total) by mouth every 6 (six) hours as needed for severe pain or breakthrough pain. 12/09/22   Amin, Ankit C, MD  prednisoLONE acetate (PRED FORTE) 1 % ophthalmic suspension Place 1 drop into the left eye 3 (three) times daily. 03/25/22   [provider]  senna-docusate (SENOKOT-S) 8.6-50 MG tablet  Take 2 tablets by mouth at bedtime as needed for moderate constipation. 12/09/22   Amin, Ankit C, MD  terbinafine (LAMISIL) 250 MG tablet Take 1 tablet (250 mg total) by mouth daily. 11/10/22   Kerri Perches, MD      Allergies    Ciprofloxacin hcl, Mycophenolate mofetil, Statins, Hydrocodone, and Penicillins    Review of Systems   Review of Systems  Respiratory:  Positive for cough.   Neurological:  Positive for weakness.  All other systems reviewed and are negative.   Physical Exam Updated Vital Signs BP (!) 149/101   Pulse 83   Temp 98 F (36.7 C) (Oral)   Resp 16   Wt 64 kg   SpO2 98%   BMI 22.77 kg/m   Physical Exam Vitals and nursing note reviewed.  Constitutional:      Appearance: Normal appearance.  HENT:     Head: Normocephalic and atraumatic.     Right Ear: External ear normal.     Left Ear: External ear normal.     Nose: Nose normal.     Mouth/Throat:     Mouth: Mucous membranes are dry.  Eyes:     Extraocular Movements: Extraocular movements intact.     Conjunctiva/sclera: Conjunctivae normal.     Pupils: Pupils are equal, round, and reactive to light.  Cardiovascular:     Rate and Rhythm: Normal rate and regular rhythm.     Pulses: Normal pulses.     Heart sounds: Normal heart sounds.  Pulmonary:     Effort: Pulmonary effort is normal.     Breath sounds: Normal breath sounds.  Abdominal:     General: Abdomen is flat. Bowel sounds are normal.     Palpations: Abdomen is soft.  Musculoskeletal:        General: Normal range of motion.     Cervical back: Normal range of motion and neck supple.  Skin:    General: Skin is warm.     Capillary Refill: Capillary refill takes less than 2 seconds.     Comments: Stage 3 pressure ulcer sacrum  Neurological:     General: No focal deficit present.     Mental Status: She is alert and oriented to person, place, and time.  Psychiatric:        Mood and Affect: Mood normal.        Behavior: Behavior normal.     ED Results / Procedures / Treatments   Labs (all labs ordered are listed, but only abnormal results are displayed) Labs Reviewed  SARS CORONAVIRUS 2 BY RT PCR - Abnormal; Notable for the following components:      Result Value   SARS Coronavirus 2 by RT PCR POSITIVE (*)    All other components within normal limits  COMPREHENSIVE METABOLIC PANEL - Abnormal; Notable for the following components:   Potassium 3.1 (*)    Glucose, Bld 100 (*)    Calcium 8.6 (*)    Albumin 3.0 (*)    All other components within normal limits  CBC WITH DIFFERENTIAL/PLATELET - Abnormal; Notable for the following components:   RBC 3.55 (*)     Hemoglobin 10.6 (*)    HCT 35.4 (*)    MCHC 29.9 (*)    RDW 15.6 (*)    All other components within normal limits  URINALYSIS, ROUTINE W REFLEX MICROSCOPIC - Abnormal; Notable for the following components:   Color, Urine AMBER (*)    Ketones, ur 20 (*)  Protein, ur 100 (*)    All other components within normal limits  MAGNESIUM    EKG EKG Interpretation Date/Time:  Thursday December 24 2022 16:17:24 EDT Ventricular Rate:  79 PR Interval:  176 QRS Duration:  97 QT Interval:  431 QTC Calculation: 495 R Axis:   -22  Text Interpretation: Ectopic atrial rhythm Atrial premature complexes Abnormal R-wave progression, early transition Left ventricular hypertrophy Borderline prolonged QT interval No significant change since last tracing Confirmed by Jacalyn Lefevre [phone]) on 12/24/2022 4:31:24 PM  Radiology DG Chest Portable 1 View  Result Date: 12/24/2022 CLINICAL DATA:  Cough EXAM: PORTABLE CHEST 1 VIEW COMPARISON:  12/09/2022 FINDINGS: The heart size and mediastinal contours are within normal limits. Both lungs are clear. The visualized skeletal structures are unremarkable. IMPRESSION: No active disease. Electronically Signed   By: Charlett Nose M.D.   On: 12/24/2022 17:40    Procedures Procedures    Medications Ordered in ED Medications  potassium chloride SA (KLOR-CON M) CR tablet 40 mEq (40 mEq Oral Given 12/24/22 1653)    ED Course/ Medical Decision Making/ A&P                                 Medical Decision Making Amount and/or Complexity of Data Reviewed Labs: ordered. Radiology: ordered.  Risk Prescription drug management. Decision regarding hospitalization.   This patient presents to the ED for concern of weakness, this involves an extensive number of treatment options, and is a complaint that carries with it a high risk of complications and morbidity.  The differential diagnosis includes infection, covid, electrolyte abn   Co morbidities that complicate  the patient evaluation  htn, endometrial cancer, neuropathy, hx DVT (on Eliquis), CHF, peripheral edema and spinal stenosis   Additional history obtained:  Additional history obtained from epic chart review External records from outside source obtained and reviewed including son   Lab Tests:  I Ordered, and personally interpreted labs.  The pertinent results include:  cbc with hgb 10.6 (stable); cmp with k low at 3.1 (4 on 9/18),    Imaging Studies ordered:  I ordered imaging studies including cxr  I independently visualized and interpreted imaging which showed No active disease.  I agree with the radiologist interpretation   Cardiac Monitoring:  The patient was maintained on a cardiac monitor.  I personally viewed and interpreted the cardiac monitored which showed an underlying rhythm of: nsr   Medicines ordered and prescription drug management:  I ordered medication including kdur  for hypokalemia  Reevaluation of the patient after these medicines showed that the patient improved I have reviewed the patients home medicines and have made adjustments as needed   Test Considered:  cxr   Consultations Obtained:  I requested consultation with the hospitalist (Dr. Mariea Clonts),  and discussed lab and imaging findings as well as pertinent plan -she will admit  Problem List / ED Course:  Covid-19:  pt is on eliquis, so can't take paxlovid.  Pt is oxygenating well and no pna, but she does have FTT.  She will need admission for PT eval and possible rehab placement.   Reevaluation:  After the interventions noted above, I reevaluated the patient and found that they have :improved   Social Determinants of Health:  Lives at home   Dispostion:  After consideration of the diagnostic results and the patients response to treatment, I feel that the patent would benefit from admission.  Final Clinical Impression(s) / ED Diagnoses Final diagnoses:  COVID-19   Failure to thrive in adult    Rx / DC Orders ED Discharge Orders     None         Jacalyn Lefevre, MD 12/24/22 1836

## 2022-12-24 NOTE — Assessment & Plan Note (Addendum)
Stable and compensated.  Recent echo 11/2022 EF of 50%, grade 1 DD, possible inferior wall hypokinesis. - Resume Lasix 20 mg every 48hrs as patient takes it

## 2022-12-24 NOTE — Assessment & Plan Note (Signed)
Potassium 3.1, magnesium 2.3.  Likely from poor oral intake, she is also supposed to be on Lasix 20 mg daily. - Replete K

## 2022-12-24 NOTE — Assessment & Plan Note (Signed)
Stable and unchanged from baseline.  Unna boot dressings present.

## 2022-12-24 NOTE — H&P (Addendum)
History and Physical    [name] [name] ZOX:[id number] DOB: [birth year] DOA: 12/24/2022  PCP: Kerri Perches, MD   Patient coming from: Home  I have personally briefly reviewed patient's old medical records in [facility]  Chief Complaint: Weakness  HPI: [name] [name] is a 89 y.o. female with medical history significant for NASH, DVT, hypertension, congestive heart failure.  Patient brought to the ED by family with reports of progressive generalized weakness.  The time of my evaluation, patient is awake alert oriented x 4 and able to answer questions appropriately.  She lives alone, but has family members that check on her.  Over the past 2 days she has been generally weak, with poor oral intake, unable to get out of bed, was not taking her Lasix so that she would not have to get up to use the bathroom.  Reports her lower extremity swelling is at baseline.,  And previous wounds have healed.  Legs very seen yesterday and wrapped.  He is barely ambulatory at baseline and does not get out of the house, and over the past few weeks has developed a sacral decubitus ulcer. She reports a mild cough over the past couple of days, but otherwise denies difficulty breathing, no chest pain, no vomiting no diarrhea.  This morning she tested positive for COVID with her home COVID test.  She has gotten the COVID vaccines except the last booster, and is unaware of  prior COVID infection.  Family requesting rehab stay.  Recent hospitalization 9/16 to 9/18 for CHF exacerbation.  ED Course: Pressure 140s to 170s otherwise stable vitals.  COVID test positive.  Potassium 3.1.  Magnesium 2.3. Chest X-ray negative for acute abnormality. UA not suggestive of UTI. Hospitalist to admit for failure to thrive.  Review of Systems: As per HPI all other systems reviewed and negative.  Past Medical History:  Diagnosis Date   Acute deep vein thrombosis (DVT) of left femoral vein (HCC) 02/07/2019   Dx 02/05/2019    Arthritis    spinal stenosis   Deep vein thrombosis (DVT) of left lower extremity (HCC) 08/04/2019   Elevated LFTs    secondary to fatty liver, negative work-up in 2011   Hypercholesterolemia    Hypertension    Lumbar spinal stenosis 01/14/2018   L3-4 and L4-5   Myopathy 02/15/2018   Osteoarthritis    right knee   Peripheral neuropathy 07/06/2018   PONV (postoperative nausea and vomiting)    Uterine cancer (HCC) 08/2006   grade 1, no recurrence up to 2013    Past Surgical History:  Procedure Laterality Date   ABDOMINAL HYSTERECTOMY  2008   adenocarcinoma stage 1   APPENDECTOMY  1973   BREAST BIOPSY Left 2018   Benign   CHOLECYSTECTOMY  1973   COLONOSCOPY    06/21/2007   WJX:BJYNWG rectum/Sigmoid diverticula, diminutive hepatic flexure polyp s/p bx. Benign.    COLONOSCOPY N/A 10/17/2012   Dr. Darrick Penna- moderate diverticulosis was noted in the sigmoid colon, moderate sized internal hemorrhoids. next tcs in10 years   ESOPHAGOGASTRODUODENOSCOPY N/A 03/20/2016   Dr. Darrick Penna, widely patent Schatzki ring, anemia due to ASA induced erosive gastritis, mild duodenitis. gastric bx benign without H.pylori.    FOOT SURGERY  2007   Pins in toes on left foot, 5 to 6 yrs ago   GIVENS CAPSULE STUDY N/A 03/20/2016   Procedure: GIVENS CAPSULE STUDY;  Surgeon: West Bali, MD;  Location: [facility];  Service: Endoscopy;  Laterality: N/A;  MUSCLE BIOPSY Left 06/07/2018   Procedure: LEFT THIGH MUSCLE BIOPSY;  Surgeon: Harriette Bouillon, MD;  Location: [facility];  Service: General;  Laterality: Left;   TONSILLECTOMY       reports that she has never smoked. She has never used smokeless tobacco. She reports that she does not drink alcohol and does not use drugs.  Allergies  Allergen Reactions   Ciprofloxacin Hcl Other (See Comments)    Chills  Sick  Chills Sick   Mycophenolate Mofetil Other (See Comments)    Myalgias  Mouth sores  Difficulty  swallowing  Bruising  Myalgias Mouth sores Difficulty swallowing Bruising   Statins Other (See Comments)    Dx of in   Hydrocodone Nausea Only   Penicillins Rash    Family History  Problem Relation Age of Onset   Heart disease Father    Bladder Cancer Brother        in remission    Hypertension Sister    Dementia Sister    Diabetes type II Sister    Hypertension Sister    Aneurysm Sister    Hypertension Brother    Arthritis Brother    Hypertension Son    Colon cancer Neg Hx      Prior to Admission medications   Medication Sig Start Date End Date Taking? Authorizing Provider  acetaminophen (TYLENOL) 650 MG CR tablet Take 650 mg by mouth every 8 (eight) hours as needed for pain or fever. 08/08/19   [provider]  cholecalciferol (VITAMIN D3) 25 MCG (1000 UNIT) tablet Take 1 tablet (1,000 Units total) by mouth daily. 11/05/22   Kerri Perches, MD  clotrimazole-betamethasone (LOTRISONE) cream Apply 1 Application topically 2 (two) times daily. Patient taking differently: Apply 1 Application topically 2 (two) times daily as needed (rash). 11/10/22   Kerri Perches, MD  ELIQUIS 5 MG TABS tablet TAKE 1 TABLET TWICE DAILY 10/13/22   Doreatha Massed, MD  ezetimibe (ZETIA) 10 MG tablet TAKE 1 TABLET EVERY DAY 03/26/22   Kerri Perches, MD  folic acid (FOLVITE) 1 MG tablet TAKE 1 TABLET EVERY DAY 10/23/22   Doreatha Massed, MD  furosemide (LASIX) 20 MG tablet TAKE 1 TABLET EVERY DAY 09/16/22   Kerri Perches, MD  gabapentin (NEURONTIN) 100 MG capsule TAKE 1 CAPSULE EVERY MORNING AND TAKE 2 CAPSULES AT BEDTIME 07/20/22   Kerri Perches, MD  magnesium oxide (MAG-OX) 400 (240 Mg) MG tablet TAKE 1 TABLET TWICE DAILY Patient taking differently: Take 1 tablet by mouth daily. 11/30/22   Doreatha Massed, MD  methotrexate 2.5 MG tablet Take 5 tablets (12.5 mg total) by mouth once a week. Caution:Chemotherapy. Protect from light. Patient taking differently:  Take 12.5 mg by mouth every Wednesday. 12/25/21   Glean Salvo, NP  mirabegron ER (MYRBETRIQ) 25 MG TB24 tablet Take 1 tablet (25 mg total) by mouth daily. 10/08/22   Kerri Perches, MD  ofloxacin (OCUFLOX) 0.3 % ophthalmic solution Place 1 drop into the left eye 4 (four) times daily. 11/27/22   [provider]  oxyCODONE (OXY IR/ROXICODONE) 5 MG immediate release tablet Take 1 tablet (5 mg total) by mouth every 6 (six) hours as needed for severe pain or breakthrough pain. 12/09/22   Amin, Ankit C, MD  prednisoLONE acetate (PRED FORTE) 1 % ophthalmic suspension Place 1 drop into the left eye 3 (three) times daily. 03/25/22   [provider]  senna-docusate (SENOKOT-S) 8.6-50 MG tablet Take 2 tablets by mouth  at bedtime as needed for moderate constipation. 12/09/22   Amin, Ankit C, MD  terbinafine (LAMISIL) 250 MG tablet Take 1 tablet (250 mg total) by mouth daily. 11/10/22   Kerri Perches, MD    Physical Exam: Vitals:   12/24/22 1132 12/24/22 1132 12/24/22 1539 12/24/22 1630  BP:  (!) 150/75 (!) 154/86 (!) 149/101  Pulse:  83 74 83  Resp:  17 (!) 22 16  Temp:  98.4 F (36.9 C) 98 F (36.7 C)   TempSrc:  Oral Oral   SpO2:  97% 97% 98%  Weight: 64 kg       Constitutional: NAD, calm, comfortable Vitals:   12/24/22 1132 12/24/22 1132 12/24/22 1539 12/24/22 1630  BP:  (!) 150/75 (!) 154/86 (!) 149/101  Pulse:  83 74 83  Resp:  17 (!) 22 16  Temp:  98.4 F (36.9 C) 98 F (36.7 C)   TempSrc:  Oral Oral   SpO2:  97% 97% 98%  Weight: 64 kg      Eyes: PERRL, lids and conjunctivae normal ENMT: Mucous membranes are dry  Neck: normal, supple, no masses, no thyromegaly Respiratory: clear to auscultation bilaterally, no wheezing, no crackles. Normal respiratory effort. No accessory muscle use.  Cardiovascular: Regular rate and rhythm, no murmurs / rubs / gallops. Bilat lower extremity wrapped in Unna boot dressing , distal aspect of feet appear puffy, otherwise legs did  not appear swollen.  Abdomen: no tenderness, no masses palpated. No hepatosplenomegaly. Bowel sounds positive.  Musculoskeletal: no clubbing / cyanosis. No joint deformity upper and lower extremities.  Skin: Sacral descriptors ulcer, stage III, very small area of unhealthy granulation tissue about the center, no fluctuance, no significant tenderness on palpation, no discharge or drainage appreciated.  No signs of infection. Neurologic: No facial asymmetry, moving extremities spontaneously Psychiatric: Normal judgment and insight. Alert and oriented x 3. Normal mood.     Labs on Admission: I have personally reviewed following labs and imaging studies  CBC: Recent Labs  Lab 12/24/22 1233  WBC 5.4  NEUTROABS 4.1  HGB 10.6*  HCT 35.4*  MCV 99.7  PLT 226   Basic Metabolic Panel: Recent Labs  Lab 12/24/22 1233  NA 140  K 3.1*  CL 101  CO2 28  GLUCOSE 100*  BUN 20  CREATININE 0.83  CALCIUM 8.6*  MG 2.3   GFR: Estimated Creatinine Clearance: 43.9 mL/min (by C-G formula based on SCr of 0.83 mg/dL). Liver Function Tests: Recent Labs  Lab 12/24/22 1233  AST 27  ALT 16  ALKPHOS 46  BILITOT 0.9  PROT 6.7  ALBUMIN 3.0*   Urine analysis:    Component Value Date/Time   COLORURINE AMBER (A) 12/24/2022 1149   APPEARANCEUR CLEAR 12/24/2022 1149   APPEARANCEUR Cloudy (A) 01/06/2021 1143   LABSPEC 1.021 12/24/2022 1149   PHURINE 5.0 12/24/2022 1149   GLUCOSEU NEGATIVE 12/24/2022 1149   HGBUR NEGATIVE 12/24/2022 1149   HGBUR small 03/18/2010 1140   BILIRUBINUR NEGATIVE 12/24/2022 1149   BILIRUBINUR Negative 01/06/2021 1143   KETONESUR 20 (A) 12/24/2022 1149   PROTEINUR 100 (A) 12/24/2022 1149   UROBILINOGEN 1.0 11/01/2019 1614   UROBILINOGEN 0.2 03/18/2010 1140   NITRITE NEGATIVE 12/24/2022 1149   LEUKOCYTESUR NEGATIVE 12/24/2022 1149    Radiological Exams on Admission: DG Chest Portable 1 View  Result Date: 12/24/2022 CLINICAL DATA:  Cough EXAM: PORTABLE CHEST 1  VIEW COMPARISON:  12/09/2022 FINDINGS: The heart size and mediastinal contours are within normal limits. Both  lungs are clear. The visualized skeletal structures are unremarkable. IMPRESSION: No active disease. Electronically Signed   By: Charlett Nose M.D.   On: 12/24/2022 17:40    EKG: Independently reviewed.  Ectopic atrial rhythm, rate 79, QTc prolonged at 495.  No significant change from prior.  LVH.  Assessment/Plan Principal Problem:   Failure to thrive in adult Active Problems:   Hypokalemia   COVID-19 virus infection   Sacral decubitus ulcer, stage III (HCC)   Essential hypertension   Lower extremity edema   Gait abnormality   History of DVT (deep vein thrombosis)   Chronic diastolic CHF (congestive heart failure) (HCC)   Assessment and Plan: * Failure to thrive in adult Generalized weakness in the setting of COVID infection.  Poor oral intake unable to ambulate or get out of bed.  Generalized weakness has been progressive, now developed a sacral decubitus ulcer. -Will need placement.   Sacral decubitus ulcer, stage III (HCC) No Signs of infection.   - Wound care consult  COVID-19 virus infection Reports mild cough without dyspnea.  Chest x-ray negative for acute abnormality.  On room air, sat greater than 97%. - Supportive care -Obtain inflammatory panel, repeat in a.m.  Hypokalemia Potassium 3.1, magnesium 2.3.  Likely from poor oral intake, she is also supposed to be on Lasix 20 mg daily. - Replete K  Chronic diastolic CHF (congestive heart failure) (HCC) Stable and compensated.  Recent echo 11/2022 EF of 50%, grade 1 DD, possible inferior wall hypokinesis. - Resume Lasix 20 mg every 48hrs as patient takes it  History of DVT (deep vein thrombosis) Resume Eliquis  Lower extremity edema Stable and unchanged from baseline.  Unna boot dressings present.  Essential hypertension Stable. -Resume Lasix 20 mg as patient takes it every other day   DVT prophylaxis:  ELiquis Code Status: FULL Code-ACP documents reviewed states DNR status.  But patient tells me she wants to be full code. Family Communication:  None at bedside at the time of my evaluation. Disposition Plan: ~ 2 days Consults called: None  Admission status:  Obs tele ( prolonged Qt )   Author: Onnie Boer, MD 12/24/2022 9:53 PM  For on call review [email or web address].

## 2022-12-24 NOTE — Assessment & Plan Note (Signed)
No Signs of infection.   - Wound care consult

## 2022-12-25 ENCOUNTER — Observation Stay (HOSPITAL_COMMUNITY): Payer: Medicare HMO

## 2022-12-25 DIAGNOSIS — R627 Adult failure to thrive: Secondary | ICD-10-CM | POA: Diagnosis present

## 2022-12-25 DIAGNOSIS — I5032 Chronic diastolic (congestive) heart failure: Secondary | ICD-10-CM | POA: Diagnosis present

## 2022-12-25 DIAGNOSIS — I872 Venous insufficiency (chronic) (peripheral): Secondary | ICD-10-CM | POA: Diagnosis present

## 2022-12-25 DIAGNOSIS — Z66 Do not resuscitate: Secondary | ICD-10-CM | POA: Diagnosis present

## 2022-12-25 DIAGNOSIS — Z8542 Personal history of malignant neoplasm of other parts of uterus: Secondary | ICD-10-CM | POA: Diagnosis not present

## 2022-12-25 DIAGNOSIS — R7989 Other specified abnormal findings of blood chemistry: Secondary | ICD-10-CM | POA: Diagnosis not present

## 2022-12-25 DIAGNOSIS — K7581 Nonalcoholic steatohepatitis (NASH): Secondary | ICD-10-CM | POA: Diagnosis present

## 2022-12-25 DIAGNOSIS — E876 Hypokalemia: Secondary | ICD-10-CM | POA: Diagnosis present

## 2022-12-25 DIAGNOSIS — Z86718 Personal history of other venous thrombosis and embolism: Secondary | ICD-10-CM | POA: Diagnosis not present

## 2022-12-25 DIAGNOSIS — J90 Pleural effusion, not elsewhere classified: Secondary | ICD-10-CM | POA: Diagnosis not present

## 2022-12-25 DIAGNOSIS — I110 Hypertensive heart disease with heart failure: Secondary | ICD-10-CM | POA: Diagnosis present

## 2022-12-25 DIAGNOSIS — R531 Weakness: Secondary | ICD-10-CM | POA: Diagnosis present

## 2022-12-25 DIAGNOSIS — Z860100 Personal history of colon polyps, unspecified: Secondary | ICD-10-CM | POA: Diagnosis not present

## 2022-12-25 DIAGNOSIS — Z8249 Family history of ischemic heart disease and other diseases of the circulatory system: Secondary | ICD-10-CM | POA: Diagnosis not present

## 2022-12-25 DIAGNOSIS — Z880 Allergy status to penicillin: Secondary | ICD-10-CM | POA: Diagnosis not present

## 2022-12-25 DIAGNOSIS — E7800 Pure hypercholesterolemia, unspecified: Secondary | ICD-10-CM | POA: Diagnosis present

## 2022-12-25 DIAGNOSIS — Z8261 Family history of arthritis: Secondary | ICD-10-CM | POA: Diagnosis not present

## 2022-12-25 DIAGNOSIS — K7689 Other specified diseases of liver: Secondary | ICD-10-CM | POA: Diagnosis not present

## 2022-12-25 DIAGNOSIS — Z888 Allergy status to other drugs, medicaments and biological substances status: Secondary | ICD-10-CM | POA: Diagnosis not present

## 2022-12-25 DIAGNOSIS — I10 Essential (primary) hypertension: Secondary | ICD-10-CM | POA: Diagnosis not present

## 2022-12-25 DIAGNOSIS — Z90710 Acquired absence of both cervix and uterus: Secondary | ICD-10-CM | POA: Diagnosis not present

## 2022-12-25 DIAGNOSIS — Z7901 Long term (current) use of anticoagulants: Secondary | ICD-10-CM | POA: Diagnosis not present

## 2022-12-25 DIAGNOSIS — Z79899 Other long term (current) drug therapy: Secondary | ICD-10-CM | POA: Diagnosis not present

## 2022-12-25 DIAGNOSIS — I2510 Atherosclerotic heart disease of native coronary artery without angina pectoris: Secondary | ICD-10-CM | POA: Diagnosis not present

## 2022-12-25 DIAGNOSIS — U071 COVID-19: Secondary | ICD-10-CM | POA: Diagnosis present

## 2022-12-25 DIAGNOSIS — Z8052 Family history of malignant neoplasm of bladder: Secondary | ICD-10-CM | POA: Diagnosis not present

## 2022-12-25 DIAGNOSIS — Z833 Family history of diabetes mellitus: Secondary | ICD-10-CM | POA: Diagnosis not present

## 2022-12-25 DIAGNOSIS — L89153 Pressure ulcer of sacral region, stage 3: Secondary | ICD-10-CM | POA: Diagnosis present

## 2022-12-25 DIAGNOSIS — N390 Urinary tract infection, site not specified: Secondary | ICD-10-CM | POA: Diagnosis present

## 2022-12-25 LAB — FOLATE: Folate: 12.3 ng/mL (ref >5.9)

## 2022-12-25 LAB — TSH: TSH: 3.449 u[IU]/mL (ref 0.350–4.500)

## 2022-12-25 LAB — BASIC METABOLIC PANEL
Anion gap: 8 (ref 5–15)
BUN: 19 mg/dL (ref 8–23)
CO2: 27 mmol/L (ref 22–32)
Calcium: 8.2 mg/dL — ABNORMAL LOW (ref 8.9–10.3)
Chloride: 106 mmol/L (ref 98–111)
Creatinine, Ser: 0.63 mg/dL (ref 0.44–1.00)
GFR, Estimated: >60 mL/min (ref >60)
Glucose, Bld: 78 mg/dL (ref 70–99)
Potassium: 4.5 mmol/L (ref 3.5–5.1)
Sodium: 141 mmol/L (ref 135–145)

## 2022-12-25 LAB — CK: Total CK: 50 U/L (ref 38–234)

## 2022-12-25 LAB — MAGNESIUM: Magnesium: 2 mg/dL (ref 1.7–2.4)

## 2022-12-25 LAB — C-REACTIVE PROTEIN: CRP: 5.1 mg/dL — ABNORMAL HIGH (ref <1.0)

## 2022-12-25 LAB — VITAMIN B12: Vitamin B-12: 781 pg/mL (ref 180–914)

## 2022-12-25 LAB — T4, FREE: Free T4: 1.49 ng/dL — ABNORMAL HIGH (ref 0.61–1.12)

## 2022-12-25 LAB — LACTATE DEHYDROGENASE: LDH: 213 U/L — ABNORMAL HIGH (ref 98–192)

## 2022-12-25 LAB — D-DIMER, QUANTITATIVE: D-Dimer, Quant: 2.94 ug{FEU}/mL — ABNORMAL HIGH (ref 0.00–0.50)

## 2022-12-25 LAB — FERRITIN: Ferritin: 419 ng/mL — ABNORMAL HIGH (ref 11–307)

## 2022-12-25 MED ORDER — HYDRALAZINE HCL 20 MG/ML IJ SOLN: 10.0000 mg | Freq: Four times a day (QID) | INTRAMUSCULAR | Status: DC | PRN

## 2022-12-25 MED ORDER — IOHEXOL 350 MG/ML SOLN
75.0000 mL | Freq: Once | INTRAVENOUS | Status: AC | PRN
  Administered 2022-12-25: 75 mL via INTRAVENOUS

## 2022-12-25 MED ORDER — LACTATED RINGERS IV SOLN: INTRAVENOUS | Status: AC

## 2022-12-25 MED ORDER — ENSURE ENLIVE PO LIQD
237.0000 mL | Freq: Two times a day (BID) | ORAL | Status: DC
  Administered 2022-12-25 – 2022-12-29 (×8): 237 mL via ORAL

## 2022-12-25 MED ORDER — ADULT MULTIVITAMIN W/MINERALS CH
1.0000 | ORAL_TABLET | Freq: Every day | ORAL | Status: DC
  Administered 2022-12-25 – 2022-12-29 (×5): 1 via ORAL
  Filled 2022-12-25 (×5): qty 1

## 2022-12-25 NOTE — TOC Progression Note (Signed)
Transition of Care [facility]) - Progression Note    Patient Details  Name: [name] [name] MRN: [id number] Date of Birth: 18-Apr-1932  Transition of [facility]) CM/SW Contact  Karn Cassis, [place] Phone Number: 12/25/2022, 1:46 PM  Clinical Narrative: Pt accepts bed at [facility]. Facility notified and can accept [name] if auth received. Pt will be in private room for isolation due to COVID. Pt aware. CMA starting authorization.       Expected Discharge Plan: Skilled Nursing Facility Barriers to Discharge: Continued Medical Work up  Expected Discharge Plan and Services In-house Referral: Clinical Social Work     Living arrangements for the past 2 months: Single Family Home                                       Social Determinants of Health (SDOH) Interventions SDOH Screenings   Food Insecurity: No Food Insecurity (12/24/2022)  Housing: Low Risk  (12/24/2022)  Recent Concern: Housing - Medium Risk (12/08/2022)  Transportation Needs: No Transportation Needs (12/24/2022)  Utilities: Not At Risk (12/24/2022)  Alcohol Screen: Low Risk  (01/23/2021)  Depression (PHQ2-9): Low Risk  (11/10/2022)  Financial Resource Strain: Low Risk  (01/23/2021)  Physical Activity: Insufficiently Active (01/23/2021)  Social Connections: Socially Isolated (01/23/2021)  Stress: No Stress Concern Present (01/23/2021)  Tobacco Use: Low Risk  (12/24/2022)    Readmission Risk Interventions     No data to display

## 2022-12-25 NOTE — Hospital Course (Signed)
89 y.o. female with medical history significant for NASH, DVT, hypertension, HFpEF.  Patient brought to the ED by family with reports of progressive generalized weakness.  The patient was recently mid to the hospital from 12/07/2022 to 12/09/2022 due to acute on chronic HFpEF.  She was discharged home with furosemide 20 mg daily.  The patient lives alone, but family member check on her frequently.  Since discharge from the hospital, the patient visited the ED at [facility] on 12/09/22 because of generalized weakness and near syncope.  The patient was diagnosed with a UTI and sent home with Macrobid. Over the past 2 to 3 days, the patient has had worsening generalized weakness and cough with poor oral intake.  The patient took a home COVID test which was positive.  The patient denies any fevers, chills, chest pain, shortness breath, nausea, vomiting, diarrhea, abdominal pain. At baseline, the patient has poor functioning.  She is able to get out of bed with assistance and transfer to wheelchair.  However she has been weaker to the point where she is unable to get out of bed.  Because of worsening weakness, the patient presented for further evaluation and treatment. In the ED, the patient was afebrile hemodynamically stable with oxygen saturation 100% room air.  WBC 5.3, hemoglobin 10.6, platelets 226,000.  Sodium 141, potassium 4.5, bicarbonate 27, serum creatinine 0.63. The patient remained clinically stable through the hospitalization.  She was stable on RA without any increase WOB and tolerated her diet without any n/v/d.   PT recommended SNF.  Hospitalization was prolonged due to financial/social circumstance surrounding SNF search for the patient.

## 2022-12-25 NOTE — Progress Notes (Signed)
PROGRESS NOTE  TABBATHA BORDELON [name]:[id number] DOB: 1933-02-14 DOA: 12/24/2022 PCP: Kerri Perches, MD  Brief History:   89 y.o. female with medical history significant for NASH, DVT, hypertension, HFpEF.  Patient brought to the ED by family with reports of progressive generalized weakness.  The patient was recently mid to the hospital from 12/07/2022 to 12/09/2022 due to acute on chronic HFpEF.  She was discharged home with furosemide 20 mg daily.  The patient lives alone, but family member check on her frequently.  Since discharge from the hospital, the patient visited the ED at [facility] on 12/09/22 because of generalized weakness and near syncope.  The patient was diagnosed with a UTI and sent home with Macrobid. Over the past 2 to 3 days, the patient has had worsening generalized weakness and cough with poor oral intake.  The patient took a home COVID test which was positive.  The patient denies any fevers, chills, chest pain, shortness breath, nausea, vomiting, diarrhea, abdominal pain. At baseline, the patient has poor functioning.  She is able to get out of bed with assistance and transfer to wheelchair.  However she has been weaker to the point where she is unable to get out of bed.  Because of worsening weakness, the patient presented for further evaluation and treatment. In the ED, the patient was afebrile hemodynamically stable with oxygen saturation 100% room air.  WBC 5.3, hemoglobin 10.6, platelets 226,000.  Sodium 141, potassium 4.5, bicarbonate 27, serum creatinine 0.63.     Assessment/Plan: Failure to thrive/generalized weakness -Secondary to COVID-19 infection -Check B12 -TSH -Folic acid -PT evaluation -UA negative for pyuria -liberalize diet  COVID-19 infection -Stable on room air -Follow inflammatory markers  Stage III sacral cubitus ulcer -Does not appear infected on examination -Wound care consult  Hypokalemia -Repleted -Check magnesium--2.3  Chronic  HFpEF -Clinically compensated -Continue furosemide 20 mg daily -12/08/2022 echo EF 50%, grade 1 DD, trivial MR  Recurrent lower extremity DVT -Continue apixaban  Venous stasis dermatitis -Consult wound care for Unna boot dressings  Essential hypertension -stable -does not appear to be on meds at home        Family Communication:   sister at bedside 10/4  Consultants:  none  Code Status:  FULL   DVT Prophylaxis:  apixaban   Procedures: As Listed in Progress Note Above  Antibiotics: None     Subjective: Patient denies fevers, chills, headache, chest pain, dyspnea, nausea, vomiting, diarrhea, abdominal pain, dysuria, hematuria, hematochezia, and melena.   Objective: Vitals:   12/24/22 2043 12/24/22 2355 12/25/22 0109 12/25/22 0525  BP: (!) 171/74 (!) 174/71 (!) 170/74 (!) 159/85  Pulse: 80 76  79  Resp: 18 17  18   Temp: 97.7 F (36.5 C) 97.8 F (36.6 C)  98 F (36.7 C)  TempSrc: Oral Oral  Oral  SpO2: 99% 100%  99%  Weight: 60.5 kg       Intake/Output Summary (Last 24 hours) at 12/25/2022 0753 Last data filed at 12/25/2022 0544 Gross per 24 hour  Intake --  Output 50 ml  Net -50 ml   Weight change:  Exam:  General:  Pt is alert, follows commands appropriately, not in acute distress HEENT: No icterus, No thrush, No neck mass, [place]/AT Cardiovascular: RRR, S1/S2, no rubs, no gallops Respiratory: fine bibasilar crackles. No wheeze Abdomen: Soft/+BS, non tender, non distended, no guarding Extremities: trace LE edema, No lymphangitis, No petechiae, No rashes, no synovitis;  gabapentin  100-200 mg Oral BID   mirabegron ER  25 mg Oral Daily   Continuous Infusions:  Procedures/Studies: DG Chest Portable 1 View  Result Date: 12/24/2022 CLINICAL DATA:  Cough EXAM: PORTABLE CHEST 1 VIEW COMPARISON:  12/09/2022 FINDINGS: The heart size and mediastinal contours are within normal limits. Both lungs are clear. The visualized skeletal structures are unremarkable. IMPRESSION: No active disease. Electronically Signed   By: Charlett Nose M.D.   On: 12/24/2022 17:40   ECHOCARDIOGRAM COMPLETE  Result Date: 12/08/2022    ECHOCARDIOGRAM REPORT   Patient Name:   [name] [name] Date of Exam: 12/08/2022 Medical Rec #:  [id number]    Height:       66.0 in Accession #:    [id number]   Weight:       144.8 lb Date of Birth:  [birth year]     BSA:          1.744 m Patient Age:    89 years     BP:           145/94 mmHg Patient Gender: F             HR:           93 bpm. [facility] Procedure: 2D Echo, Cardiac Doppler and Color Doppler Indications:    CHF  History:        Patient has prior history of Echocardiogram examinations, most                 recent 09/22/2021. CHF, Signs/Symptoms:Edema; Risk                 Factors:Hypertension and Dyslipidemia.  Sonographer:    Mikki Harbor Referring Phys: [id number] ASIA B ZIERLE-GHOSH  Sonographer Comments: Technically difficult study due to poor echo windows and suboptimal subcostal window. IMPRESSIONS  1. Left ventricular ejection fraction, by estimation, is 50%. The left ventricle has mildly decreased function. Left ventricular endocardial border not optimally defined to evaluate regional wall motion but there appears to be inferior wall hypokinesis.  Consider Limited Echo with contrast. There is mild left ventricular hypertrophy. Left ventricular diastolic parameters are consistent with Grade I diastolic dysfunction (impaired relaxation).  2. Right ventricular systolic function was not well visualized. The right ventricular size is normal. Tricuspid regurgitation signal is inadequate for assessing PA pressure.  3. The mitral valve is grossly normal. Trivial mitral valve regurgitation. No evidence of mitral stenosis.  4. The aortic valve is calcified. There is severe calcifcation of the aortic valve. There is severe thickening of the aortic valve. There is severe restriction in the opening of aortic valve leaflets. Aortic valve regurgitation is mild. Mild aortic valve stenosis based on the gradients but could be moderate based on aortic leaflet mobility. Aortic valve mean gradient measures 11.5 mmHg. Aortic valve Vmax measures 2.29 m/s. FINDINGS  Left Ventricle: Left ventricular ejection fraction, by estimation, is 50%. The left ventricle has mildly decreased function. Left ventricular endocardial border not optimally defined to evaluate regional wall motion. The left ventricular internal  cavity  size was normal in size. There is mild left ventricular hypertrophy. Left ventricular diastolic parameters are consistent with Grade I diastolic dysfunction (impaired relaxation). Right Ventricle: The right ventricular size is normal. No increase in right ventricular wall thickness. Right ventricular systolic function was not well visualized. Tricuspid regurgitation signal is inadequate for assessing PA pressure. Left Atrium: Left atrial size was normal in size. Right Atrium: Right atrial size was  PROGRESS NOTE  TABBATHA BORDELON [name]:[id number] DOB: 1933-02-14 DOA: 12/24/2022 PCP: Kerri Perches, MD  Brief History:   89 y.o. female with medical history significant for NASH, DVT, hypertension, HFpEF.  Patient brought to the ED by family with reports of progressive generalized weakness.  The patient was recently mid to the hospital from 12/07/2022 to 12/09/2022 due to acute on chronic HFpEF.  She was discharged home with furosemide 20 mg daily.  The patient lives alone, but family member check on her frequently.  Since discharge from the hospital, the patient visited the ED at [facility] on 12/09/22 because of generalized weakness and near syncope.  The patient was diagnosed with a UTI and sent home with Macrobid. Over the past 2 to 3 days, the patient has had worsening generalized weakness and cough with poor oral intake.  The patient took a home COVID test which was positive.  The patient denies any fevers, chills, chest pain, shortness breath, nausea, vomiting, diarrhea, abdominal pain. At baseline, the patient has poor functioning.  She is able to get out of bed with assistance and transfer to wheelchair.  However she has been weaker to the point where she is unable to get out of bed.  Because of worsening weakness, the patient presented for further evaluation and treatment. In the ED, the patient was afebrile hemodynamically stable with oxygen saturation 100% room air.  WBC 5.3, hemoglobin 10.6, platelets 226,000.  Sodium 141, potassium 4.5, bicarbonate 27, serum creatinine 0.63.     Assessment/Plan: Failure to thrive/generalized weakness -Secondary to COVID-19 infection -Check B12 -TSH -Folic acid -PT evaluation -UA negative for pyuria -liberalize diet  COVID-19 infection -Stable on room air -Follow inflammatory markers  Stage III sacral cubitus ulcer -Does not appear infected on examination -Wound care consult  Hypokalemia -Repleted -Check magnesium--2.3  Chronic  HFpEF -Clinically compensated -Continue furosemide 20 mg daily -12/08/2022 echo EF 50%, grade 1 DD, trivial MR  Recurrent lower extremity DVT -Continue apixaban  Venous stasis dermatitis -Consult wound care for Unna boot dressings  Essential hypertension -stable -does not appear to be on meds at home        Family Communication:   sister at bedside 10/4  Consultants:  none  Code Status:  FULL   DVT Prophylaxis:  apixaban   Procedures: As Listed in Progress Note Above  Antibiotics: None     Subjective: Patient denies fevers, chills, headache, chest pain, dyspnea, nausea, vomiting, diarrhea, abdominal pain, dysuria, hematuria, hematochezia, and melena.   Objective: Vitals:   12/24/22 2043 12/24/22 2355 12/25/22 0109 12/25/22 0525  BP: (!) 171/74 (!) 174/71 (!) 170/74 (!) 159/85  Pulse: 80 76  79  Resp: 18 17  18   Temp: 97.7 F (36.5 C) 97.8 F (36.6 C)  98 F (36.7 C)  TempSrc: Oral Oral  Oral  SpO2: 99% 100%  99%  Weight: 60.5 kg       Intake/Output Summary (Last 24 hours) at 12/25/2022 0753 Last data filed at 12/25/2022 0544 Gross per 24 hour  Intake --  Output 50 ml  Net -50 ml   Weight change:  Exam:  General:  Pt is alert, follows commands appropriately, not in acute distress HEENT: No icterus, No thrush, No neck mass, [place]/AT Cardiovascular: RRR, S1/S2, no rubs, no gallops Respiratory: fine bibasilar crackles. No wheeze Abdomen: Soft/+BS, non tender, non distended, no guarding Extremities: trace LE edema, No lymphangitis, No petechiae, No rashes, no synovitis;  gabapentin  100-200 mg Oral BID   mirabegron ER  25 mg Oral Daily   Continuous Infusions:  Procedures/Studies: DG Chest Portable 1 View  Result Date: 12/24/2022 CLINICAL DATA:  Cough EXAM: PORTABLE CHEST 1 VIEW COMPARISON:  12/09/2022 FINDINGS: The heart size and mediastinal contours are within normal limits. Both lungs are clear. The visualized skeletal structures are unremarkable. IMPRESSION: No active disease. Electronically Signed   By: Charlett Nose M.D.   On: 12/24/2022 17:40   ECHOCARDIOGRAM COMPLETE  Result Date: 12/08/2022    ECHOCARDIOGRAM REPORT   Patient Name:   [name] [name] Date of Exam: 12/08/2022 Medical Rec #:  [id number]    Height:       66.0 in Accession #:    [id number]   Weight:       144.8 lb Date of Birth:  [birth year]     BSA:          1.744 m Patient Age:    89 years     BP:           145/94 mmHg Patient Gender: F             HR:           93 bpm. [facility] Procedure: 2D Echo, Cardiac Doppler and Color Doppler Indications:    CHF  History:        Patient has prior history of Echocardiogram examinations, most                 recent 09/22/2021. CHF, Signs/Symptoms:Edema; Risk                 Factors:Hypertension and Dyslipidemia.  Sonographer:    Mikki Harbor Referring Phys: [id number] ASIA B ZIERLE-GHOSH  Sonographer Comments: Technically difficult study due to poor echo windows and suboptimal subcostal window. IMPRESSIONS  1. Left ventricular ejection fraction, by estimation, is 50%. The left ventricle has mildly decreased function. Left ventricular endocardial border not optimally defined to evaluate regional wall motion but there appears to be inferior wall hypokinesis.  Consider Limited Echo with contrast. There is mild left ventricular hypertrophy. Left ventricular diastolic parameters are consistent with Grade I diastolic dysfunction (impaired relaxation).  2. Right ventricular systolic function was not well visualized. The right ventricular size is normal. Tricuspid regurgitation signal is inadequate for assessing PA pressure.  3. The mitral valve is grossly normal. Trivial mitral valve regurgitation. No evidence of mitral stenosis.  4. The aortic valve is calcified. There is severe calcifcation of the aortic valve. There is severe thickening of the aortic valve. There is severe restriction in the opening of aortic valve leaflets. Aortic valve regurgitation is mild. Mild aortic valve stenosis based on the gradients but could be moderate based on aortic leaflet mobility. Aortic valve mean gradient measures 11.5 mmHg. Aortic valve Vmax measures 2.29 m/s. FINDINGS  Left Ventricle: Left ventricular ejection fraction, by estimation, is 50%. The left ventricle has mildly decreased function. Left ventricular endocardial border not optimally defined to evaluate regional wall motion. The left ventricular internal  cavity  size was normal in size. There is mild left ventricular hypertrophy. Left ventricular diastolic parameters are consistent with Grade I diastolic dysfunction (impaired relaxation). Right Ventricle: The right ventricular size is normal. No increase in right ventricular wall thickness. Right ventricular systolic function was not well visualized. Tricuspid regurgitation signal is inadequate for assessing PA pressure. Left Atrium: Left atrial size was normal in size. Right Atrium: Right atrial size was  gabapentin  100-200 mg Oral BID   mirabegron ER  25 mg Oral Daily   Continuous Infusions:  Procedures/Studies: DG Chest Portable 1 View  Result Date: 12/24/2022 CLINICAL DATA:  Cough EXAM: PORTABLE CHEST 1 VIEW COMPARISON:  12/09/2022 FINDINGS: The heart size and mediastinal contours are within normal limits. Both lungs are clear. The visualized skeletal structures are unremarkable. IMPRESSION: No active disease. Electronically Signed   By: Charlett Nose M.D.   On: 12/24/2022 17:40   ECHOCARDIOGRAM COMPLETE  Result Date: 12/08/2022    ECHOCARDIOGRAM REPORT   Patient Name:   [name] [name] Date of Exam: 12/08/2022 Medical Rec #:  [id number]    Height:       66.0 in Accession #:    [id number]   Weight:       144.8 lb Date of Birth:  [birth year]     BSA:          1.744 m Patient Age:    89 years     BP:           145/94 mmHg Patient Gender: F             HR:           93 bpm. [facility] Procedure: 2D Echo, Cardiac Doppler and Color Doppler Indications:    CHF  History:        Patient has prior history of Echocardiogram examinations, most                 recent 09/22/2021. CHF, Signs/Symptoms:Edema; Risk                 Factors:Hypertension and Dyslipidemia.  Sonographer:    Mikki Harbor Referring Phys: [id number] ASIA B ZIERLE-GHOSH  Sonographer Comments: Technically difficult study due to poor echo windows and suboptimal subcostal window. IMPRESSIONS  1. Left ventricular ejection fraction, by estimation, is 50%. The left ventricle has mildly decreased function. Left ventricular endocardial border not optimally defined to evaluate regional wall motion but there appears to be inferior wall hypokinesis.  Consider Limited Echo with contrast. There is mild left ventricular hypertrophy. Left ventricular diastolic parameters are consistent with Grade I diastolic dysfunction (impaired relaxation).  2. Right ventricular systolic function was not well visualized. The right ventricular size is normal. Tricuspid regurgitation signal is inadequate for assessing PA pressure.  3. The mitral valve is grossly normal. Trivial mitral valve regurgitation. No evidence of mitral stenosis.  4. The aortic valve is calcified. There is severe calcifcation of the aortic valve. There is severe thickening of the aortic valve. There is severe restriction in the opening of aortic valve leaflets. Aortic valve regurgitation is mild. Mild aortic valve stenosis based on the gradients but could be moderate based on aortic leaflet mobility. Aortic valve mean gradient measures 11.5 mmHg. Aortic valve Vmax measures 2.29 m/s. FINDINGS  Left Ventricle: Left ventricular ejection fraction, by estimation, is 50%. The left ventricle has mildly decreased function. Left ventricular endocardial border not optimally defined to evaluate regional wall motion. The left ventricular internal  cavity  size was normal in size. There is mild left ventricular hypertrophy. Left ventricular diastolic parameters are consistent with Grade I diastolic dysfunction (impaired relaxation). Right Ventricle: The right ventricular size is normal. No increase in right ventricular wall thickness. Right ventricular systolic function was not well visualized. Tricuspid regurgitation signal is inadequate for assessing PA pressure. Left Atrium: Left atrial size was normal in size. Right Atrium: Right atrial size was

## 2022-12-25 NOTE — Evaluation (Signed)
Physical Therapy Evaluation Patient Details Name: [name] [name] MRN: [id number] DOB: 1932-06-17 Today's Date: 12/25/2022  History of Present Illness  [name] [name] is a 89 y.o. female with medical history significant for NASH, DVT, hypertension, congestive heart failure.  Patient brought to the ED by family with reports of progressive generalized weakness.  The time of my evaluation, patient is awake alert oriented x 4 and able to answer questions appropriately.  She lives alone, but has family members that check on her.  Over the past 2 days she has been generally weak, with poor oral intake, unable to get out of bed, was not taking her Lasix so that she would not have to get up to use the bathroom.  Reports her lower extremity swelling is at baseline.,  And previous wounds have healed.  Legs very seen yesterday and wrapped.  He is barely ambulatory at baseline and does not get out of the house, and over the past few weeks has developed a sacral decubitus ulcer.  She reports a mild cough over the past couple of days, but otherwise denies difficulty breathing, no chest pain, no vomiting no diarrhea.  This morning she tested positive for COVID with her home COVID test.  She has gotten the COVID vaccines except the last booster, and is unaware of  prior COVID infection.  Family requesting rehab stay.     Recent hospitalization 9/16 to 9/18 for CHF exacerbation.   Clinical Impression  Patient demonstrates slow labored movement for sitting up at bedside with c/o severe pain in legs and feet mostly LLE.  Patient able to stand and take a couple of side steps with RW, but has to sit quickly due to buckling of knees and increasing pain in feet/legs.  Patient unable to scoot laterally to attempt transferring to chair and put back to bed with Mod/max assist for repositioning.  Patient will benefit from continued skilled physical therapy in hospital and recommended venue below to increase strength, balance, endurance for  safe ADLs and gait.          If plan is discharge home, recommend the following: A lot of help with walking and/or transfers;A lot of help with bathing/dressing/bathroom;Help with stairs or ramp for entrance;Assistance with cooking/housework   Can travel by private vehicle   No    Equipment Recommendations None recommended by PT  Recommendations for Other Services       Functional Status Assessment Patient has had a recent decline in their functional status and demonstrates the ability to make significant improvements in function in a reasonable and predictable amount of time.     Precautions / Restrictions Precautions Precautions: Fall Restrictions Weight Bearing Restrictions: No      Mobility  Bed Mobility Overal bed mobility: Needs Assistance Bed Mobility: Supine to Sit     Supine to sit: Mod assist, Max assist     General bed mobility comments: increased time, labored movement    Transfers Overall transfer level: Needs assistance Equipment used: Rolling walker (2 wheels) Transfers: Sit to/from Stand Sit to Stand: Mod assist, Max assist          Lateral/Scoot Transfers: Max assist General transfer comment: Patient limited for standing due to buckling knees, fall risk and unable to scoot laterally due to weakness    Ambulation/Gait Ambulation/Gait assistance: Max assist Gait Distance (Feet): 2 Feet Assistive device: Rolling walker (2 wheels) Gait Pattern/deviations: Decreased step length - right, Decreased step length - left, Decreased stride length, Shuffle Gait  velocity: slow     General Gait Details: limited to a couple of shuffling side steps before having to sit due to c/o fatigue and weakness  Stairs            Wheelchair Mobility     Tilt Bed    Modified Rankin (Stroke Patients Only)       Balance Overall balance assessment: Needs assistance Sitting-balance support: Feet supported, No upper extremity supported Sitting  balance-Leahy Scale: Fair Sitting balance - Comments: seated at EOB   Standing balance support: Reliant on assistive device for balance, During functional activity, Bilateral upper extremity supported Standing balance-Leahy Scale: Poor Standing balance comment: using RW                             Pertinent Vitals/Pain Pain Assessment Pain Assessment: Faces Faces Pain Scale: Hurts even more Pain Location: BLE, feet Pain Descriptors / Indicators: Grimacing, Guarding, Sharp Pain Intervention(s): Limited activity within patient's tolerance, Monitored during session, Repositioned    Home Living Family/patient expects to be discharged to:: Private residence Living Arrangements: Alone Available Help at Discharge: Family;Available PRN/intermittently;Personal care attendant Type of Home: House Home Access: Stairs to enter Entrance Stairs-Rails: Doctor, general practice of Steps: 5   Home Layout: One level Home Equipment: Agricultural consultant (2 wheels);Grab bars - tub/shower;Shower seat;BSC/3in1;Wheelchair - manual      Prior Function Prior Level of Function : Needs assist       Physical Assist : ADLs (physical);Mobility (physical) Mobility (physical): Bed mobility;Transfers;Gait;Stairs   Mobility Comments: Modified independent for transfers scooting over from wheelchair to bed, cna do a few steps at bedside with RW ADLs Comments: Patient needs assist with ADL's with help from home aides who come for a couple of hours a day M-F. Performs sliding transfers between surfaces     Extremity/Trunk Assessment   Upper Extremity Assessment Upper Extremity Assessment: Generalized weakness    Lower Extremity Assessment Lower Extremity Assessment: Generalized weakness    Cervical / Trunk Assessment Cervical / Trunk Assessment: Normal  Communication   Communication Communication: No apparent difficulties  Cognition Arousal: Alert Behavior During Therapy: WFL for  tasks assessed/performed Overall Cognitive Status: Within Functional Limits for tasks assessed                                          General Comments      Exercises     Assessment/Plan    PT Assessment Patient needs continued PT services  PT Problem List Decreased strength;Decreased activity tolerance;Decreased mobility;Pain;Decreased balance       PT Treatment Interventions DME instruction;Functional mobility training;Therapeutic activities;Therapeutic exercise;Balance training;Wheelchair mobility training;Patient/family education    PT Goals (Current goals can be found in the Care Plan section)  Acute Rehab PT Goals Patient Stated Goal: return home after rehab PT Goal Formulation: With patient Time For Goal Achievement: 01/08/23 Potential to Achieve Goals: Good    Frequency Min 3X/week     Co-evaluation               AM-PAC PT "6 Clicks" Mobility  Outcome Measure Help needed turning from your back to your side while in a flat bed without using bedrails?: A Lot Help needed moving from lying on your back to sitting on the side of a flat bed without using bedrails?: A Lot Help needed moving to and  from a bed to a chair (including a wheelchair)?: A Lot Help needed standing up from a chair using your arms (e.g., wheelchair or bedside chair)?: A Lot Help needed to walk in hospital room?: Total Help needed climbing 3-5 steps with a railing? : Total 6 Click Score: 10    End of Session   Activity Tolerance: Patient tolerated treatment well;Patient limited by fatigue Patient left: in bed;with call bell/phone within reach Nurse Communication: Mobility status PT Visit Diagnosis: Unsteadiness on feet (R26.81);Muscle weakness (generalized) (M62.81);Other abnormalities of gait and mobility (R26.89)    Time: 1610-9604 PT Time Calculation (min) (ACUTE ONLY): 27 min   Charges:     PT Treatments $Therapeutic Activity: 23-37 mins PT General  Charges $$ ACUTE PT VISIT: 1 Visit         2:22 PM, 12/25/22 Ocie Bob, MPT Physical Therapist with [facility] [phone] office [phone] mobile phone

## 2022-12-25 NOTE — TOC Initial Note (Addendum)
Transition of Care [facility]) - Initial/Assessment Note    Patient Details  Name: [name] [name] MRN: [id number] Date of Birth: [birth year]  [facility] of [facility]) CM/SW Contact:    Karn Cassis, LCSW Phone Number: 12/25/2022, 11:58 AM  Clinical Narrative: Pt admitted due to failure to thrive. Pt reports she lives alone, but has a caregiver and other family that stay with her sometimes. She requires assist with ADLs. [facility] has been coming out for wound care. PT evaluated pt and recommend SNF. Discussed with pt who is agreeable, requesting [facility] only. Discussed Medicare.gov ratings. Pt aware COVID diagnosis yesterday may make SNF placement more difficult. Will start auth once bed found.                  Expected Discharge Plan: Skilled Nursing Facility Barriers to Discharge: Continued Medical Work up   Patient Goals and CMS Choice Patient states their goals for this hospitalization and ongoing recovery are:: SNF   Choice offered to / list presented to : Patient [place] ownership interest in [facility].provided to:: Patient    Expected Discharge Plan and Services In-house Referral: Clinical Social Work     Living arrangements for the past 2 months: Single Family Home                                      Prior Living Arrangements/Services Living arrangements for the past 2 months: Single Family Home Lives with:: Self Patient language and need for interpreter reviewed:: Yes Do you feel safe going back to the place where you live?: Yes      Need for Family Participation in Patient Care: No (Comment)   Current home services: DME, Home RN (wheelchair) Criminal Activity/Legal Involvement Pertinent to Current Situation/Hospitalization: No - Comment as needed  Activities of Daily Living   ADL Screening (condition at time of admission) Independently performs ADLs?: Yes (appropriate for developmental age) Is the patient deaf or have difficulty  hearing?: No Does the patient have difficulty seeing, even when wearing glasses/contacts?: No Does the patient have difficulty concentrating, remembering, or making decisions?: No  Permission Sought/Granted                  Emotional Assessment     Affect (typically observed): Appropriate Orientation: : Oriented to Self, Oriented to Place, Oriented to  Time, Oriented to Situation Alcohol / Substance Use: Not Applicable Psych Involvement: No (comment)  Admission diagnosis:  Failure to thrive in adult [R62.7] COVID-19 [U07.1] Patient Active Problem List   Diagnosis Date Noted   Failure to thrive in adult 12/24/2022   COVID-19 virus infection 12/24/2022   Chronic diastolic CHF (congestive heart failure) (HCC) 12/24/2022   Sacral decubitus ulcer, stage III (HCC) 12/24/2022   History of DVT (deep vein thrombosis) 12/08/2022   Pressure injury of skin 12/08/2022   Acute exacerbation of CHF (congestive heart failure) (HCC) 12/08/2022   CHF exacerbation (HCC) 12/07/2022   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   Diastolic dysfunction 01/18/2022   Aortic stenosis 01/16/2022   Mitral valve regurgitation 01/16/2022   Encounter for Medicare annual examination with abnormal findings 12/07/2021   Breast lump on right side at 11 o'clock position 11/06/2021   Severe protein-calorie malnutrition (HCC) 09/25/2021   Aortic atherosclerosis (HCC) 09/25/2021   Rhabdomyolysis 09/20/2021   Recurrent falls 09/20/2021   Leukocytosis 09/20/2021   Chronic deep vein thrombosis (DVT) of distal vein of left  lower extremity (HCC) 09/20/2021   Elevated troponin 09/20/2021   Osteoarthritis 09/20/2021   Paresthesia 05/05/2021   Gait abnormality 05/05/2021   Dermatitis 05/01/2021   Leg ulcer, left (HCC) 03/25/2021   HZV (herpes zoster virus) post herpetic neuralgia 05/31/2020   Zoster 04/30/2020   Hypomagnesemia 08/11/2019   Lower extremity edema 08/06/2019   Bilateral lower extremity edema 08/04/2019    Hypokalemia 08/04/2019   Hypoalbuminemia 08/04/2019   Venous stasis ulcers (HCC) 04/03/2019   Lower leg edema 03/22/2019   At high risk for falls per [name] fall risk assessment scale 02/09/2019   Neck pain, bilateral posterior 01/13/2019   Statins contraindicated 08/14/2018   Peripheral neuropathy 07/06/2018   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   Myopathy 02/15/2018   Lumbar spinal stenosis 01/14/2018   GERD (gastroesophageal reflux disease) 06/17/2016   Normocytic anemia    Sciatica of left side associated with disorder of lumbosacral spine 11/29/2014   Chronic constipation 10/04/2012   Urinary incontinence 06/20/2012   Vitamin D deficiency 08/22/2011   Prediabetes 05/29/2011   NASH (nonalcoholic steatohepatitis) 11/05/2009   Arthritis of right knee 09/04/2009   Hyperlipidemia 09/08/2007   Essential hypertension 09/08/2007   PCP:  Kerri Perches, MD Pharmacy:   [facility] [address], [place] - [address] [place] [address] [place] [address] Phone: [phone] Fax: [phone]  [facility] - [place], [place] - [address] [place] [address] Phone: [phone] Fax: [phone]     Social Determinants of Health (SDOH) Social History: SDOH Screenings   Food Insecurity: No Food Insecurity (12/24/2022)  Housing: Low Risk  (12/24/2022)  Recent Concern: Housing - Medium Risk (12/08/2022)  Transportation Needs: No Transportation Needs (12/24/2022)  Utilities: Not At Risk (12/24/2022)  Alcohol Screen: Low Risk  (01/23/2021)  Depression (PHQ2-9): Low Risk  (11/10/2022)  Financial Resource Strain: Low Risk  (01/23/2021)  Physical Activity: Insufficiently Active (01/23/2021)  Social Connections: Socially Isolated (01/23/2021)  Stress: No Stress Concern Present (01/23/2021)  Tobacco Use: Low Risk  (12/24/2022)   SDOH Interventions:     Readmission Risk Interventions     No data to display

## 2022-12-25 NOTE — Plan of Care (Signed)
  Problem: Acute Rehab PT Goals(only PT should resolve) Goal: Pt will Roll Supine to Side Outcome: Progressing Flowsheets (Taken 12/25/2022 1424) Pt will Roll Supine to Side: with min assist Goal: Pt Will Go Supine/Side To Sit Outcome: Progressing Flowsheets (Taken 12/25/2022 1424) Pt will go Supine/Side to Sit: with minimal assist Goal: Pt Will Go Sit To Supine/Side Outcome: Progressing Flowsheets (Taken 12/25/2022 1424) Pt will go Sit to Supine/Side: with minimal assist Goal: Patient Will Transfer Sit To/From Stand Outcome: Progressing Flowsheets (Taken 12/25/2022 1424) Patient will transfer sit to/from stand: with minimal assist Goal: Pt Will Transfer Bed To Chair/Chair To Bed Outcome: Progressing Flowsheets (Taken 12/25/2022 1424) Pt will Transfer Bed to Chair/Chair to Bed: with mod assist   2:25 PM, 12/25/22 Ocie Bob, MPT Physical Therapist with [facility] [phone] office [phone] mobile phone

## 2022-12-25 NOTE — Consult Note (Addendum)
WOC Nurse Consult Note: Reason for Consult: Consult requested for bilat legs and buttocks. Performed remotely after review of progress notes and photos in the EMR.  Pt is familiar to the [facility] team form a recent admission on 9/17.  She has chronic scattered areas of full thickness venous stasis wounds to bilat legs, red and moist with mod amt yellow drainage. This admission, she also has a Stage 3 pressure injury to the left buttock, 5X2.5cm, red and moist, mod amt yellow drainage, according to the bedside nursing wound care flow sheet, and it is present on admission.  Pressure Injury POA: Yes Dressing procedure/placement/frequency: Topical treatment orders provided for bedside nurses to perform as follows to absorb drainage and promote healing: 1. Apply Aquacel [place] # [phone]) to left buttock wound Q day, using swb to fill, then cover with foam dresing.  2. Change dressings to bilat legs Q M/W/F as follows: Apply Aquacel [place] # [phone]) to wounds on legs and cover with ABD pads and kerlex in a spiral fashion, beginning just behind toes to below knees, then cover with ace wrap in the same manner.   Post-note 1:15: Air mattress ordered to reduce pressure.   Please re-consult if further assistance is needed.  Thank-you,  Cammie Mcgee MSN, RN, CWOCN, [place], CNS [phone]

## 2022-12-25 NOTE — NC FL2 (Signed)
[place] MEDICAID FL2 LEVEL OF CARE FORM     IDENTIFICATION  Patient Name: [name] [name] Birthdate: 02-28-1933 Sex: female Admission Date (Current Location): 12/24/2022  [facility] and [place] Number:  [employer] and Address:  [facility],  [address] [place] [address]      Provider Number: [id number]  Attending Physician Name and Address:  Catarina Hartshorn, MD  Relative Name and Phone Number:       Current Level of Care: Hospital Recommended Level of Care: Skilled Nursing Facility Prior Approval Number:    Date Approved/Denied:   PASRR Number: [id number]  Discharge Plan: SNF    Current Diagnoses: Patient Active Problem List   Diagnosis Date Noted   Failure to thrive in adult 12/24/2022   COVID-19 virus infection 12/24/2022   Chronic diastolic CHF (congestive heart failure) (HCC) 12/24/2022   Sacral decubitus ulcer, stage III (HCC) 12/24/2022   History of DVT (deep vein thrombosis) 12/08/2022   Pressure injury of skin 12/08/2022   Acute exacerbation of CHF (congestive heart failure) (HCC) 12/08/2022   CHF exacerbation (HCC) 12/07/2022   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   Diastolic dysfunction 01/18/2022   Aortic stenosis 01/16/2022   Mitral valve regurgitation 01/16/2022   Encounter for Medicare annual examination with abnormal findings 12/07/2021   Breast lump on right side at 11 o'clock position 11/06/2021   Severe protein-calorie malnutrition (HCC) 09/25/2021   Aortic atherosclerosis (HCC) 09/25/2021   Rhabdomyolysis 09/20/2021   Recurrent falls 09/20/2021   Leukocytosis 09/20/2021   Chronic deep vein thrombosis (DVT) of distal vein of left lower extremity (HCC) 09/20/2021   Elevated troponin 09/20/2021   Osteoarthritis 09/20/2021   Paresthesia 05/05/2021   Gait abnormality 05/05/2021   Dermatitis 05/01/2021   Leg ulcer, left (HCC) 03/25/2021   HZV (herpes zoster virus) post herpetic neuralgia 05/31/2020   Zoster 04/30/2020    Hypomagnesemia 08/11/2019   Lower extremity edema 08/06/2019   Bilateral lower extremity edema 08/04/2019   Hypokalemia 08/04/2019   Hypoalbuminemia 08/04/2019   Venous stasis ulcers (HCC) 04/03/2019   Lower leg edema 03/22/2019   At high risk for falls per [name] fall risk assessment scale 02/09/2019   Neck pain, bilateral posterior 01/13/2019   Statins contraindicated 08/14/2018   Peripheral neuropathy 07/06/2018   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   Myopathy 02/15/2018   Lumbar spinal stenosis 01/14/2018   GERD (gastroesophageal reflux disease) 06/17/2016   Normocytic anemia    Sciatica of left side associated with disorder of lumbosacral spine 11/29/2014   Chronic constipation 10/04/2012   Urinary incontinence 06/20/2012   Vitamin D deficiency 08/22/2011   Prediabetes 05/29/2011   NASH (nonalcoholic steatohepatitis) 11/05/2009   Arthritis of right knee 09/04/2009   Hyperlipidemia 09/08/2007   Essential hypertension 09/08/2007    Orientation RESPIRATION BLADDER Height & Weight     Self, Time, Situation, Place  Normal External catheter Weight: 133 lb 6.1 oz (60.5 kg) Height:     BEHAVIORAL SYMPTOMS/MOOD NEUROLOGICAL BOWEL NUTRITION STATUS      Continent Diet (See d/c summary)  AMBULATORY STATUS COMMUNICATION OF NEEDS Skin   Extensive Assist Verbally Other (Comment) (Stage III to left buttocks with foam dressing. Venous stasis ulcer bilateral pretibial distal.)                       Personal Care Assistance Level of Assistance  Bathing, Feeding, Dressing Bathing Assistance: Maximum assistance Feeding assistance: Limited assistance Dressing Assistance: Maximum assistance     Functional Limitations Info  Sight,  Hearing, Speech Sight Info: Impaired Hearing Info: Adequate Speech Info: Adequate    SPECIAL CARE FACTORS FREQUENCY  PT (By licensed PT)     PT Frequency: 5x weekly              Contractures      Additional Factors Info  Code Status,  Allergies, Isolation Precautions Code Status Info: Full code Allergies Info: Ciprofloxacin Hcl, Mycophenolate Mofetil, Statins, Hydrocodone, Penicillins     Isolation Precautions Info: Airborne/Contact precautions COVID + 12/24/22     Current Medications (12/25/2022):  This is the current hospital active medication list Current Facility-Administered Medications  Medication Dose Route Frequency Provider Last Rate Last Admin   acetaminophen (TYLENOL) tablet 650 mg  650 mg Oral Q6H PRN Emokpae, Ejiroghene E, MD   650 mg at 12/24/22 2205   Or   acetaminophen (TYLENOL) suppository 650 mg  650 mg Rectal Q6H PRN Emokpae, Ejiroghene E, MD       apixaban (ELIQUIS) tablet 5 mg  5 mg Oral BID Emokpae, Ejiroghene E, MD   5 mg at 12/25/22 7829   folic acid (FOLVITE) tablet 1 mg  1 mg Oral Daily Emokpae, Ejiroghene E, MD   1 mg at 12/25/22 5621   furosemide (LASIX) tablet 20 mg  20 mg Oral QODAY Emokpae, Ejiroghene E, MD   20 mg at 12/25/22 3086   gabapentin (NEURONTIN) capsule 100-200 mg  100-200 mg Oral BID Emokpae, Ejiroghene E, MD   100 mg at 12/25/22 5784   guaiFENesin-dextromethorphan (ROBITUSSIN DM) 100-10 MG/5ML syrup 5 mL  5 mL Oral Q6H PRN Emokpae, Ejiroghene E, MD       hydrALAZINE (APRESOLINE) injection 10 mg  10 mg Intravenous Q6H PRN Adefeso, Oladapo, DO       lactated ringers infusion   Intravenous Continuous Tat, David, MD       mirabegron ER (MYRBETRIQ) tablet 25 mg  25 mg Oral Daily Emokpae, Ejiroghene E, MD   25 mg at 12/25/22 0952   ondansetron (ZOFRAN) tablet 4 mg  4 mg Oral Q6H PRN Emokpae, Ejiroghene E, MD       Or   ondansetron (ZOFRAN) injection 4 mg  4 mg Intravenous Q6H PRN Emokpae, Ejiroghene E, MD       polyethylene glycol (MIRALAX / GLYCOLAX) packet 17 g  17 g Oral Daily PRN Emokpae, Ejiroghene E, MD         Discharge Medications: Please see discharge summary for a list of discharge medications.  Relevant Imaging Results:  Relevant Lab Results:   Additional  Information SSN: [id number]  Karn Cassis, LCSW

## 2022-12-25 NOTE — Progress Notes (Signed)
Initial Nutrition Assessment  DOCUMENTATION CODES:   Severe malnutrition in context of chronic illness  INTERVENTION:   Ensure Plus High Protein po BID, each supplement provides 350 kcal and 20 grams of protein. Magic cup TID with meals, each supplement provides 290 kcal and 9 grams of protein. MVI with minerals daily.  NUTRITION DIAGNOSIS:   Severe Malnutrition related to chronic illness (HF, NASH) as evidenced by severe muscle depletion, percent weight loss.  GOAL:   Patient will meet greater than or equal to 90% of their needs  MONITOR:   PO intake, Supplement acceptance  REASON FOR ASSESSMENT:   Consult Assessment of nutrition requirement/status  ASSESSMENT:   89 yo female admitted with FTT, COVID positive. PMH includes NASH, HF, HTN, HLD, uterine cancer, lumbar spinal stenosis, peripheral neuropathy, DVT.  Patient endorses a lot of weight loss, but unsure exactly how much. She c/o poor appetite. She does not particularly like Ensure or Boost supplements, but agreed to drink them BID to help maximize oral intake. Will also send magic cups with meals to increase protein and calorie provision.    Labs reviewed. Medications reviewed and include folic acid, lasix. IVF: LR at 75 ml/h  Weight history reviewed. Patient with 6% weight loss in 2 weeks PTA. Total weight loss over the past 5 months is 18%, which is severe.   Patient meets criteria for severe malnutrition, given severe depletion of muscle mass and 18% weight loss within 6 months.  NUTRITION - FOCUSED PHYSICAL EXAM:  Flowsheet Row Most Recent Value  Orbital Region Moderate depletion  Upper Arm Region Mild depletion  Thoracic and Lumbar Region Mild depletion  Buccal Region Mild depletion  Temple Region Severe depletion  Clavicle Bone Region Severe depletion  Clavicle and Acromion Bone Region Severe depletion  Scapular Bone Region Severe depletion  Dorsal Hand Moderate depletion  Patellar Region Severe  depletion  Anterior Thigh Region Severe depletion  Posterior Calf Region Moderate depletion  Edema (RD Assessment) Mild  Hair Reviewed  Eyes Reviewed  Mouth Reviewed  Skin Reviewed  Nails Reviewed       Diet Order:   Diet Order             Diet regular Room service appropriate? Yes; Fluid consistency: Thin  Diet effective now                   EDUCATION NEEDS:   Not appropriate for education at this time  Skin:  Skin Assessment: Skin Integrity Issues: Skin Integrity Issues:: Stage III, Other (Comment) Stage III: L buttock Other: venous stasis ulcers to BLE  Last BM:  unknown  Height:   Ht Readings from Last 1 Encounters:  12/07/22 5\' 6"  (1.676 m)    Weight:   Wt Readings from Last 1 Encounters:  12/24/22 60.5 kg    Ideal Body Weight:  59.1 kg  BMI:  Body mass index is 21.53 kg/m.  Estimated Nutritional Needs:   Kcal:  1600-1800  Protein:  85-95 gm  Fluid:  1.6-1.8 L   Gabriel Rainwater RD, LDN, CNSC Please refer to Amion for contact information.

## 2022-12-26 DIAGNOSIS — L89153 Pressure ulcer of sacral region, stage 3: Secondary | ICD-10-CM | POA: Diagnosis not present

## 2022-12-26 DIAGNOSIS — R627 Adult failure to thrive: Secondary | ICD-10-CM | POA: Diagnosis not present

## 2022-12-26 DIAGNOSIS — I5032 Chronic diastolic (congestive) heart failure: Secondary | ICD-10-CM | POA: Diagnosis not present

## 2022-12-26 DIAGNOSIS — I10 Essential (primary) hypertension: Secondary | ICD-10-CM | POA: Diagnosis not present

## 2022-12-26 LAB — BASIC METABOLIC PANEL
Anion gap: 5 (ref 5–15)
BUN: 14 mg/dL (ref 8–23)
CO2: 30 mmol/L (ref 22–32)
Calcium: 8.6 mg/dL — ABNORMAL LOW (ref 8.9–10.3)
Chloride: 107 mmol/L (ref 98–111)
Creatinine, Ser: 0.65 mg/dL (ref 0.44–1.00)
GFR, Estimated: >60 mL/min (ref >60)
Glucose, Bld: 92 mg/dL (ref 70–99)
Potassium: 3.7 mmol/L (ref 3.5–5.1)
Sodium: 142 mmol/L (ref 135–145)

## 2022-12-26 LAB — MAGNESIUM: Magnesium: 1.7 mg/dL (ref 1.7–2.4)

## 2022-12-26 MED ORDER — ENSURE ENLIVE PO LIQD: 237.0000 mL | Freq: Two times a day (BID) | ORAL | Status: DC

## 2022-12-26 MED ORDER — FUROSEMIDE 20 MG PO TABS: 20.0000 mg | ORAL_TABLET | Freq: Every day | ORAL | Status: DC

## 2022-12-26 NOTE — Progress Notes (Signed)
Patient has had poor appetite throughout shift. Has been encouraged to eat.

## 2022-12-26 NOTE — TOC Progression Note (Signed)
Transition of Care [facility]) - Progression Note    Patient Details  Name: [name] [name] MRN: [id number] Date of Birth: April 12, 1932  [facility] CM/SW Contact  Leitha Bleak, RN Phone Number: 12/26/2022, 11:52 AM  Clinical Narrative:   Insurance Quar[employer] received for [employer]. CM called Debbie to get a bed number. Per Debbie patient will have to pay $3000 balance to return. They told her at 4:30 yesterday. CM called patient son, He states his mother would have never accepted [facility], she does not like it there and She can not go there. He states they won the appeal and does not owe money.  CM asked if they can take her home with home health, she has caregivers. He states they are not ready for her to come back home, she needs SNF first. TOC expanded the search. MD/RN updated.    Expected Discharge Plan: Skilled Nursing Facility Barriers to Discharge: SNF Pending bed offer  Expected Discharge Plan and Services In-house Referral: Clinical Social Work    Living arrangements for the past 2 months: Single Family Home Expected Discharge Date: 12/26/22                    Social Determinants of Health (SDOH) Interventions SDOH Screenings   Food Insecurity: No Food Insecurity (12/24/2022)  Housing: Low Risk  (12/24/2022)  Recent Concern: Housing - Medium Risk (12/08/2022)  Transportation Needs: No Transportation Needs (12/24/2022)  Utilities: Not At Risk (12/24/2022)  Alcohol Screen: Low Risk  (01/23/2021)  Depression (PHQ2-9): Low Risk  (11/10/2022)  Financial Resource Strain: Low Risk  (01/23/2021)  Physical Activity: Insufficiently Active (01/23/2021)  Social Connections: Socially Isolated (01/23/2021)  Stress: No Stress Concern Present (01/23/2021)  Tobacco Use: Low Risk  (12/24/2022)    Readmission Risk Interventions     No data to display

## 2022-12-26 NOTE — Discharge Summary (Signed)
Physician Discharge Summary   Patient: [name] [name] MRN: [id number] DOB: [birth year]  Admit date:     12/24/2022  Discharge date: 12/26/22  Discharge Physician: Onalee Hua Ossie Yebra   PCP: Kerri Perches, MD   Recommendations at discharge:   Please follow up with primary care provider within 1-2 weeks  Please repeat BMP and CBC in one week     Hospital Course:  89 y.o. female with medical history significant for NASH, DVT, hypertension, HFpEF.  Patient brought to the ED by family with reports of progressive generalized weakness.  The patient was recently mid to the hospital from 12/07/2022 to 12/09/2022 due to acute on chronic HFpEF.  She was discharged home with furosemide 20 mg daily.  The patient lives alone, but family member check on her frequently.  Since discharge from the hospital, the patient visited the ED at [facility] on 12/09/22 because of generalized weakness and near syncope.  The patient was diagnosed with a UTI and sent home with Macrobid. Over the past 2 to 3 days, the patient has had worsening generalized weakness and cough with poor oral intake.  The patient took a home COVID test which was positive.  The patient denies any fevers, chills, chest pain, shortness breath, nausea, vomiting, diarrhea, abdominal pain. At baseline, the patient has poor functioning.  She is able to get out of bed with assistance and transfer to wheelchair.  However she has been weaker to the point where she is unable to get out of bed.  Because of worsening weakness, the patient presented for further evaluation and treatment. In the ED, the patient was afebrile hemodynamically stable with oxygen saturation 100% room air.  WBC 5.3, hemoglobin 10.6, platelets 226,000.  Sodium 141, potassium 4.5, bicarbonate 27, serum creatinine 0.63.    Assessment and Plan:  Failure to thrive/generalized weakness -Secondary to COVID-19 infection -Check B12--781 -TSH-3.449 -Folic acid--12.3 -PT evaluation>>SNF -UA negative for  pyuria -liberalize diet   COVID-19 infection -Stable on room air -10/4 CTA chest--Neg PE, no consolidation or GGO -tolerating diet   Stage III sacral cubitus ulcer -Does not appear infected on examination -Wound care consult appreciated-- to absorb drainage and promote healing: 1. Apply Aquacel [place] # [phone]) to left buttock wound Q day, using swb to fill, then cover with foam dresing.    Hypokalemia -Repleted -Check magnesium--2.3 initially   Chronic HFpEF -Clinically compensated -Continue furosemide 20 mg daily -12/08/2022 echo EF 50%, grade 1 DD, trivial MR   Recurrent lower extremity DVT -Continue apixaban   Venous stasis dermatitis -Consult wound care for Unna boot dressings ---Change dressings to bilat legs Q M/W/F as follows: Apply Aquacel [place] # [phone]) to wounds on legs and cover with ABD pads and kerlex in a spiral fashion, beginning just behind toes to below knees, then cover with ace wrap in the same manner.    Essential hypertension -stable -does not appear to be on meds at home                 Consultants: none Procedures performed: none  Disposition: Skilled nursing facility Diet recommendation:  Regular diet DISCHARGE MEDICATION: Allergies as of 12/26/2022       Reactions   Ciprofloxacin Hcl Other (See Comments)   Chills, Sick   Mycophenolate Mofetil Other (See Comments)   Myalgias, Mouth sores, Difficulty swallowing, Bruising   Statins Other (See Comments)   Dx of in   Hydrocodone Nausea Only   Penicillins Rash  mA and/or kV according to patient size and/or use of iterative reconstruction technique. CONTRAST:  75mL OMNIPAQUE IOHEXOL 350 MG/ML SOLN COMPARISON:  12/24/2022 chest radiograph. No prior CT. FINDINGS: Cardiovascular: The quality of this exam for evaluation of pulmonary embolism is good. No evidence of pulmonary embolism. Aortic atherosclerosis. Tortuous thoracic aorta. Mild cardiomegaly, without pericardial effusion. Left main and LAD coronary artery calcification. Mediastinum/Nodes: No mediastinal or hilar adenopathy. Lungs/Pleura: Trace bilateral pleural fluid. Left lower lobe hypoattenuation laterally is likely due to prior aspiration of contrast including on 51/6. Subsegmental atelectasis in both lower lobes. Upper Abdomen: High right hepatic lobe 1.4 cm cyst. Normal imaged portions of the spleen, stomach, pancreas, adrenal glands, kidneys. Cholecystectomy.  Musculoskeletal: Accentuation of expected thoracic kyphosis. Mild superior endplate compression deformities at T5 and T6 without ventral canal encroachment. Review of the MIP images confirms the above findings. IMPRESSION: 1.  No evidence of pulmonary embolism. 2. No evidence of COVID-19 pneumonia. 3. Incidental findings, including: Coronary artery atherosclerosis. Aortic Atherosclerosis (ICD10-I70.0). Trace bilateral pleural fluid. Mild thoracic compression deformities. Electronically Signed   By: Jeronimo Greaves M.D.   On: 12/25/2022 16:19   DG Chest Portable 1 View  Result Date: 12/24/2022 CLINICAL DATA:  Cough EXAM: PORTABLE CHEST 1 VIEW COMPARISON:  12/09/2022 FINDINGS: The heart size and mediastinal contours are within normal limits. Both lungs are clear. The visualized skeletal structures are unremarkable. IMPRESSION: No active disease. Electronically Signed   By: Charlett Nose M.D.   On: 12/24/2022 17:40   ECHOCARDIOGRAM COMPLETE  Result Date: 12/08/2022    ECHOCARDIOGRAM REPORT   Patient Name:   [name] [name] Date of Exam: 12/08/2022 Medical Rec #:  [id number]    Height:       66.0 in Accession #:    [id number]   Weight:       144.8 lb Date of Birth:  [birth year]     BSA:          1.744 m Patient Age:    89 years     BP:           145/94 mmHg Patient Gender: F            HR:           93 bpm. [facility] Procedure: 2D Echo, Cardiac Doppler and Color Doppler Indications:    CHF  History:        Patient has prior history of Echocardiogram examinations, most                 recent 09/22/2021. CHF, Signs/Symptoms:Edema; Risk                 Factors:Hypertension and Dyslipidemia.  Sonographer:    Mikki Harbor Referring Phys: [id number] ASIA B ZIERLE-GHOSH  Sonographer Comments: Technically difficult study due to poor echo windows and suboptimal subcostal window. IMPRESSIONS  1. Left ventricular ejection fraction, by estimation, is 50%. The left ventricle has mildly decreased function. Left  ventricular endocardial border not optimally defined to evaluate regional wall motion but there appears to be inferior wall hypokinesis.  Consider Limited Echo with contrast. There is mild left ventricular hypertrophy. Left ventricular diastolic parameters are consistent with Grade I diastolic dysfunction (impaired relaxation).  2. Right ventricular systolic function was not well visualized. The right ventricular size is normal. Tricuspid regurgitation signal is inadequate for assessing PA pressure.  3. The mitral valve is grossly normal. Trivial mitral valve regurgitation. No evidence of mitral stenosis.  4. The  mA and/or kV according to patient size and/or use of iterative reconstruction technique. CONTRAST:  75mL OMNIPAQUE IOHEXOL 350 MG/ML SOLN COMPARISON:  12/24/2022 chest radiograph. No prior CT. FINDINGS: Cardiovascular: The quality of this exam for evaluation of pulmonary embolism is good. No evidence of pulmonary embolism. Aortic atherosclerosis. Tortuous thoracic aorta. Mild cardiomegaly, without pericardial effusion. Left main and LAD coronary artery calcification. Mediastinum/Nodes: No mediastinal or hilar adenopathy. Lungs/Pleura: Trace bilateral pleural fluid. Left lower lobe hypoattenuation laterally is likely due to prior aspiration of contrast including on 51/6. Subsegmental atelectasis in both lower lobes. Upper Abdomen: High right hepatic lobe 1.4 cm cyst. Normal imaged portions of the spleen, stomach, pancreas, adrenal glands, kidneys. Cholecystectomy.  Musculoskeletal: Accentuation of expected thoracic kyphosis. Mild superior endplate compression deformities at T5 and T6 without ventral canal encroachment. Review of the MIP images confirms the above findings. IMPRESSION: 1.  No evidence of pulmonary embolism. 2. No evidence of COVID-19 pneumonia. 3. Incidental findings, including: Coronary artery atherosclerosis. Aortic Atherosclerosis (ICD10-I70.0). Trace bilateral pleural fluid. Mild thoracic compression deformities. Electronically Signed   By: Jeronimo Greaves M.D.   On: 12/25/2022 16:19   DG Chest Portable 1 View  Result Date: 12/24/2022 CLINICAL DATA:  Cough EXAM: PORTABLE CHEST 1 VIEW COMPARISON:  12/09/2022 FINDINGS: The heart size and mediastinal contours are within normal limits. Both lungs are clear. The visualized skeletal structures are unremarkable. IMPRESSION: No active disease. Electronically Signed   By: Charlett Nose M.D.   On: 12/24/2022 17:40   ECHOCARDIOGRAM COMPLETE  Result Date: 12/08/2022    ECHOCARDIOGRAM REPORT   Patient Name:   [name] [name] Date of Exam: 12/08/2022 Medical Rec #:  [id number]    Height:       66.0 in Accession #:    [id number]   Weight:       144.8 lb Date of Birth:  [birth year]     BSA:          1.744 m Patient Age:    89 years     BP:           145/94 mmHg Patient Gender: F            HR:           93 bpm. [facility] Procedure: 2D Echo, Cardiac Doppler and Color Doppler Indications:    CHF  History:        Patient has prior history of Echocardiogram examinations, most                 recent 09/22/2021. CHF, Signs/Symptoms:Edema; Risk                 Factors:Hypertension and Dyslipidemia.  Sonographer:    Mikki Harbor Referring Phys: [id number] ASIA B ZIERLE-GHOSH  Sonographer Comments: Technically difficult study due to poor echo windows and suboptimal subcostal window. IMPRESSIONS  1. Left ventricular ejection fraction, by estimation, is 50%. The left ventricle has mildly decreased function. Left  ventricular endocardial border not optimally defined to evaluate regional wall motion but there appears to be inferior wall hypokinesis.  Consider Limited Echo with contrast. There is mild left ventricular hypertrophy. Left ventricular diastolic parameters are consistent with Grade I diastolic dysfunction (impaired relaxation).  2. Right ventricular systolic function was not well visualized. The right ventricular size is normal. Tricuspid regurgitation signal is inadequate for assessing PA pressure.  3. The mitral valve is grossly normal. Trivial mitral valve regurgitation. No evidence of mitral stenosis.  4. The  mA and/or kV according to patient size and/or use of iterative reconstruction technique. CONTRAST:  75mL OMNIPAQUE IOHEXOL 350 MG/ML SOLN COMPARISON:  12/24/2022 chest radiograph. No prior CT. FINDINGS: Cardiovascular: The quality of this exam for evaluation of pulmonary embolism is good. No evidence of pulmonary embolism. Aortic atherosclerosis. Tortuous thoracic aorta. Mild cardiomegaly, without pericardial effusion. Left main and LAD coronary artery calcification. Mediastinum/Nodes: No mediastinal or hilar adenopathy. Lungs/Pleura: Trace bilateral pleural fluid. Left lower lobe hypoattenuation laterally is likely due to prior aspiration of contrast including on 51/6. Subsegmental atelectasis in both lower lobes. Upper Abdomen: High right hepatic lobe 1.4 cm cyst. Normal imaged portions of the spleen, stomach, pancreas, adrenal glands, kidneys. Cholecystectomy.  Musculoskeletal: Accentuation of expected thoracic kyphosis. Mild superior endplate compression deformities at T5 and T6 without ventral canal encroachment. Review of the MIP images confirms the above findings. IMPRESSION: 1.  No evidence of pulmonary embolism. 2. No evidence of COVID-19 pneumonia. 3. Incidental findings, including: Coronary artery atherosclerosis. Aortic Atherosclerosis (ICD10-I70.0). Trace bilateral pleural fluid. Mild thoracic compression deformities. Electronically Signed   By: Jeronimo Greaves M.D.   On: 12/25/2022 16:19   DG Chest Portable 1 View  Result Date: 12/24/2022 CLINICAL DATA:  Cough EXAM: PORTABLE CHEST 1 VIEW COMPARISON:  12/09/2022 FINDINGS: The heart size and mediastinal contours are within normal limits. Both lungs are clear. The visualized skeletal structures are unremarkable. IMPRESSION: No active disease. Electronically Signed   By: Charlett Nose M.D.   On: 12/24/2022 17:40   ECHOCARDIOGRAM COMPLETE  Result Date: 12/08/2022    ECHOCARDIOGRAM REPORT   Patient Name:   [name] [name] Date of Exam: 12/08/2022 Medical Rec #:  [id number]    Height:       66.0 in Accession #:    [id number]   Weight:       144.8 lb Date of Birth:  [birth year]     BSA:          1.744 m Patient Age:    89 years     BP:           145/94 mmHg Patient Gender: F            HR:           93 bpm. [facility] Procedure: 2D Echo, Cardiac Doppler and Color Doppler Indications:    CHF  History:        Patient has prior history of Echocardiogram examinations, most                 recent 09/22/2021. CHF, Signs/Symptoms:Edema; Risk                 Factors:Hypertension and Dyslipidemia.  Sonographer:    Mikki Harbor Referring Phys: [id number] ASIA B ZIERLE-GHOSH  Sonographer Comments: Technically difficult study due to poor echo windows and suboptimal subcostal window. IMPRESSIONS  1. Left ventricular ejection fraction, by estimation, is 50%. The left ventricle has mildly decreased function. Left  ventricular endocardial border not optimally defined to evaluate regional wall motion but there appears to be inferior wall hypokinesis.  Consider Limited Echo with contrast. There is mild left ventricular hypertrophy. Left ventricular diastolic parameters are consistent with Grade I diastolic dysfunction (impaired relaxation).  2. Right ventricular systolic function was not well visualized. The right ventricular size is normal. Tricuspid regurgitation signal is inadequate for assessing PA pressure.  3. The mitral valve is grossly normal. Trivial mitral valve regurgitation. No evidence of mitral stenosis.  4. The  mA and/or kV according to patient size and/or use of iterative reconstruction technique. CONTRAST:  75mL OMNIPAQUE IOHEXOL 350 MG/ML SOLN COMPARISON:  12/24/2022 chest radiograph. No prior CT. FINDINGS: Cardiovascular: The quality of this exam for evaluation of pulmonary embolism is good. No evidence of pulmonary embolism. Aortic atherosclerosis. Tortuous thoracic aorta. Mild cardiomegaly, without pericardial effusion. Left main and LAD coronary artery calcification. Mediastinum/Nodes: No mediastinal or hilar adenopathy. Lungs/Pleura: Trace bilateral pleural fluid. Left lower lobe hypoattenuation laterally is likely due to prior aspiration of contrast including on 51/6. Subsegmental atelectasis in both lower lobes. Upper Abdomen: High right hepatic lobe 1.4 cm cyst. Normal imaged portions of the spleen, stomach, pancreas, adrenal glands, kidneys. Cholecystectomy.  Musculoskeletal: Accentuation of expected thoracic kyphosis. Mild superior endplate compression deformities at T5 and T6 without ventral canal encroachment. Review of the MIP images confirms the above findings. IMPRESSION: 1.  No evidence of pulmonary embolism. 2. No evidence of COVID-19 pneumonia. 3. Incidental findings, including: Coronary artery atherosclerosis. Aortic Atherosclerosis (ICD10-I70.0). Trace bilateral pleural fluid. Mild thoracic compression deformities. Electronically Signed   By: Jeronimo Greaves M.D.   On: 12/25/2022 16:19   DG Chest Portable 1 View  Result Date: 12/24/2022 CLINICAL DATA:  Cough EXAM: PORTABLE CHEST 1 VIEW COMPARISON:  12/09/2022 FINDINGS: The heart size and mediastinal contours are within normal limits. Both lungs are clear. The visualized skeletal structures are unremarkable. IMPRESSION: No active disease. Electronically Signed   By: Charlett Nose M.D.   On: 12/24/2022 17:40   ECHOCARDIOGRAM COMPLETE  Result Date: 12/08/2022    ECHOCARDIOGRAM REPORT   Patient Name:   [name] [name] Date of Exam: 12/08/2022 Medical Rec #:  [id number]    Height:       66.0 in Accession #:    [id number]   Weight:       144.8 lb Date of Birth:  [birth year]     BSA:          1.744 m Patient Age:    89 years     BP:           145/94 mmHg Patient Gender: F            HR:           93 bpm. [facility] Procedure: 2D Echo, Cardiac Doppler and Color Doppler Indications:    CHF  History:        Patient has prior history of Echocardiogram examinations, most                 recent 09/22/2021. CHF, Signs/Symptoms:Edema; Risk                 Factors:Hypertension and Dyslipidemia.  Sonographer:    Mikki Harbor Referring Phys: [id number] ASIA B ZIERLE-GHOSH  Sonographer Comments: Technically difficult study due to poor echo windows and suboptimal subcostal window. IMPRESSIONS  1. Left ventricular ejection fraction, by estimation, is 50%. The left ventricle has mildly decreased function. Left  ventricular endocardial border not optimally defined to evaluate regional wall motion but there appears to be inferior wall hypokinesis.  Consider Limited Echo with contrast. There is mild left ventricular hypertrophy. Left ventricular diastolic parameters are consistent with Grade I diastolic dysfunction (impaired relaxation).  2. Right ventricular systolic function was not well visualized. The right ventricular size is normal. Tricuspid regurgitation signal is inadequate for assessing PA pressure.  3. The mitral valve is grossly normal. Trivial mitral valve regurgitation. No evidence of mitral stenosis.  4. The  Physician Discharge Summary   Patient: [name] [name] MRN: [id number] DOB: [birth year]  Admit date:     12/24/2022  Discharge date: 12/26/22  Discharge Physician: Onalee Hua Ossie Yebra   PCP: Kerri Perches, MD   Recommendations at discharge:   Please follow up with primary care provider within 1-2 weeks  Please repeat BMP and CBC in one week     Hospital Course:  89 y.o. female with medical history significant for NASH, DVT, hypertension, HFpEF.  Patient brought to the ED by family with reports of progressive generalized weakness.  The patient was recently mid to the hospital from 12/07/2022 to 12/09/2022 due to acute on chronic HFpEF.  She was discharged home with furosemide 20 mg daily.  The patient lives alone, but family member check on her frequently.  Since discharge from the hospital, the patient visited the ED at [facility] on 12/09/22 because of generalized weakness and near syncope.  The patient was diagnosed with a UTI and sent home with Macrobid. Over the past 2 to 3 days, the patient has had worsening generalized weakness and cough with poor oral intake.  The patient took a home COVID test which was positive.  The patient denies any fevers, chills, chest pain, shortness breath, nausea, vomiting, diarrhea, abdominal pain. At baseline, the patient has poor functioning.  She is able to get out of bed with assistance and transfer to wheelchair.  However she has been weaker to the point where she is unable to get out of bed.  Because of worsening weakness, the patient presented for further evaluation and treatment. In the ED, the patient was afebrile hemodynamically stable with oxygen saturation 100% room air.  WBC 5.3, hemoglobin 10.6, platelets 226,000.  Sodium 141, potassium 4.5, bicarbonate 27, serum creatinine 0.63.    Assessment and Plan:  Failure to thrive/generalized weakness -Secondary to COVID-19 infection -Check B12--781 -TSH-3.449 -Folic acid--12.3 -PT evaluation>>SNF -UA negative for  pyuria -liberalize diet   COVID-19 infection -Stable on room air -10/4 CTA chest--Neg PE, no consolidation or GGO -tolerating diet   Stage III sacral cubitus ulcer -Does not appear infected on examination -Wound care consult appreciated-- to absorb drainage and promote healing: 1. Apply Aquacel [place] # [phone]) to left buttock wound Q day, using swb to fill, then cover with foam dresing.    Hypokalemia -Repleted -Check magnesium--2.3 initially   Chronic HFpEF -Clinically compensated -Continue furosemide 20 mg daily -12/08/2022 echo EF 50%, grade 1 DD, trivial MR   Recurrent lower extremity DVT -Continue apixaban   Venous stasis dermatitis -Consult wound care for Unna boot dressings ---Change dressings to bilat legs Q M/W/F as follows: Apply Aquacel [place] # [phone]) to wounds on legs and cover with ABD pads and kerlex in a spiral fashion, beginning just behind toes to below knees, then cover with ace wrap in the same manner.    Essential hypertension -stable -does not appear to be on meds at home                 Consultants: none Procedures performed: none  Disposition: Skilled nursing facility Diet recommendation:  Regular diet DISCHARGE MEDICATION: Allergies as of 12/26/2022       Reactions   Ciprofloxacin Hcl Other (See Comments)   Chills, Sick   Mycophenolate Mofetil Other (See Comments)   Myalgias, Mouth sores, Difficulty swallowing, Bruising   Statins Other (See Comments)   Dx of in   Hydrocodone Nausea Only   Penicillins Rash

## 2022-12-26 NOTE — Plan of Care (Signed)

## 2022-12-27 DIAGNOSIS — E876 Hypokalemia: Secondary | ICD-10-CM | POA: Diagnosis not present

## 2022-12-27 DIAGNOSIS — L89153 Pressure ulcer of sacral region, stage 3: Secondary | ICD-10-CM | POA: Diagnosis not present

## 2022-12-27 DIAGNOSIS — R627 Adult failure to thrive: Secondary | ICD-10-CM | POA: Diagnosis not present

## 2022-12-27 DIAGNOSIS — I5032 Chronic diastolic (congestive) heart failure: Secondary | ICD-10-CM | POA: Diagnosis not present

## 2022-12-27 MED ORDER — ACETAMINOPHEN 500 MG PO TABS
1000.0000 mg | ORAL_TABLET | Freq: Three times a day (TID) | ORAL | Status: DC
  Administered 2022-12-27 – 2022-12-29 (×7): 1000 mg via ORAL
  Filled 2022-12-27 (×7): qty 2

## 2022-12-27 MED ORDER — PREDNISOLONE ACETATE 1 % OP SUSP
1.0000 [drp] | Freq: Three times a day (TID) | OPHTHALMIC | Status: DC
  Administered 2022-12-27 – 2022-12-29 (×6): 1 [drp] via OPHTHALMIC
  Filled 2022-12-27: qty 1

## 2022-12-27 MED ORDER — OFLOXACIN 0.3 % OP SOLN
1.0000 [drp] | Freq: Four times a day (QID) | OPHTHALMIC | Status: DC
  Administered 2022-12-27 – 2022-12-29 (×9): 1 [drp] via OPHTHALMIC
  Filled 2022-12-27: qty 5

## 2022-12-27 NOTE — Progress Notes (Signed)
PROGRESS NOTE  [name] [name]:[id number] DOB: [birth year] DOA: 12/24/2022 PCP: Kerri Perches, MD  Brief History:   89 y.o. female with medical history significant for NASH, DVT, hypertension, HFpEF.  Patient brought to the ED by family with reports of progressive generalized weakness.  The patient was recently mid to the hospital from 12/07/2022 to 12/09/2022 due to acute on chronic HFpEF.  She was discharged home with furosemide 20 mg daily.  The patient lives alone, but family member check on her frequently.  Since discharge from the hospital, the patient visited the ED at [facility] on 12/09/22 because of generalized weakness and near syncope.  The patient was diagnosed with a UTI and sent home with Macrobid. Over the past 2 to 3 days, the patient has had worsening generalized weakness and cough with poor oral intake.  The patient took a home COVID test which was positive.  The patient denies any fevers, chills, chest pain, shortness breath, nausea, vomiting, diarrhea, abdominal pain. At baseline, the patient has poor functioning.  She is able to get out of bed with assistance and transfer to wheelchair.  However she has been weaker to the point where she is unable to get out of bed.  Because of worsening weakness, the patient presented for further evaluation and treatment. In the ED, the patient was afebrile hemodynamically stable with oxygen saturation 100% room air.  WBC 5.3, hemoglobin 10.6, platelets 226,000.  Sodium 141, potassium 4.5, bicarbonate 27, serum creatinine 0.63.     Assessment/Plan:  Failure to thrive/generalized weakness -Secondary to COVID-19 infection -Check B12--781 -TSH-3.449 -Folic acid--12.3 -PT evaluation>>SNF -UA negative for pyuria -liberalize diet   COVID-19 infection -Stable on room air -10/4 CTA chest--Neg PE, no consolidation or GGO -tolerating diet   Stage III sacral cubitus ulcer -Does not appear infected on examination -Wound care consult  appreciated-- to absorb drainage and promote healing: 1. Apply Aquacel [place] # [phone]) to left buttock wound Q day, using swb to fill, then cover with foam dresing.    Hypokalemia -Repleted -Check magnesium--2.3 initially   Chronic HFpEF -Clinically compensated -Continue furosemide 20 mg daily -12/08/2022 echo EF 50%, grade 1 DD, trivial MR   Recurrent lower extremity DVT -Continue apixaban   Venous stasis dermatitis -Consult wound care for Unna boot dressings ---Change dressings to bilat legs Q M/W/F as follows: Apply Aquacel [place] # [phone]) to wounds on legs and cover with ABD pads and kerlex in a spiral fashion, beginning just behind toes to below knees, then cover with ace wrap in the same manner.    Essential hypertension -stable -does not appear to be on meds at home           Family Communication:  sister updated 10/5  Consultants:  none  Code Status:  FULL   DVT Prophylaxis:  apixaban   Procedures: As Listed in Progress Note Above  Antibiotics: None       Subjective: Patient denies fevers, chills, headache, chest pain, dyspnea, nausea, vomiting, diarrhea, abdominal pain, dysuria, hematuria, hematochezia, and melena. Complains of leg/feet pain, left greater than right.  Objective: Vitals:   12/26/22 1252 12/26/22 2100 12/27/22 0500 12/27/22 1329  BP: 109/63 132/71 122/69 (!) 109/53  Pulse: 84 88  76  Resp: 17 18 16 20   Temp: 98.3 F (36.8 C) 98.3 F (36.8 C) 98.2 F (36.8 C)   TempSrc:  Oral    SpO2: 98% 99% 98% 100%  Weight:  Patients:   [email or web address]   Fact Sheet for Healthcare Providers:   http://kim-miller.com/    This test is not yet approved or cleared by the [place] FDA and  has been authorized for detection and/or diagnosis of SARS-CoV-2 by FDA under an Emergency Use Authorization (EUA).  This EUA will remain in effect (meaning this test can be used) for the duration of  the COVID-19 declaration under Section 564(b)(1)  of the Act, 21 U.S.C. section 360-bbb-3(b)(1), unless the authorization is terminated or revoked sooner.   Performed at [facility], [address], [place], [place] [address]      Scheduled Meds:  acetaminophen  1,000 mg Oral Q8H   apixaban  5 mg Oral BID   feeding supplement  237 mL Oral BID BM   folic acid  1 mg Oral Daily   furosemide  20 mg Oral QODAY   gabapentin  100-200 mg Oral BID   mirabegron ER  25 mg Oral Daily   multivitamin with minerals  1 tablet Oral Daily   Continuous  Infusions:  Procedures/Studies: CT Angio Chest Pulmonary Embolism (PE) W or WO Contrast  Result Date: 12/25/2022 CLINICAL DATA:  Positive D-dimer.  COVID-19. EXAM: CT ANGIOGRAPHY CHEST WITH CONTRAST TECHNIQUE: Multidetector CT imaging of the chest was performed using the standard protocol during bolus administration of intravenous contrast. Multiplanar CT image reconstructions and MIPs were obtained to evaluate the vascular anatomy. RADIATION DOSE REDUCTION: This exam was performed according to the departmental dose-optimization program which includes automated exposure control, adjustment of the mA and/or kV according to patient size and/or use of iterative reconstruction technique. CONTRAST:  75mL OMNIPAQUE IOHEXOL 350 MG/ML SOLN COMPARISON:  12/24/2022 chest radiograph. No prior CT. FINDINGS: Cardiovascular: The quality of this exam for evaluation of pulmonary embolism is good. No evidence of pulmonary embolism. Aortic atherosclerosis. Tortuous thoracic aorta. Mild cardiomegaly, without pericardial effusion. Left main and LAD coronary artery calcification. Mediastinum/Nodes: No mediastinal or hilar adenopathy. Lungs/Pleura: Trace bilateral pleural fluid. Left lower lobe hypoattenuation laterally is likely due to prior aspiration of contrast including on 51/6. Subsegmental atelectasis in both lower lobes. Upper Abdomen: High right hepatic lobe 1.4 cm cyst. Normal imaged portions of the spleen, stomach, pancreas, adrenal glands, kidneys. Cholecystectomy. Musculoskeletal: Accentuation of expected thoracic kyphosis. Mild superior endplate compression deformities at T5 and T6 without ventral canal encroachment. Review of the MIP images confirms the above findings. IMPRESSION: 1.  No evidence of pulmonary embolism. 2. No evidence of COVID-19 pneumonia. 3. Incidental findings, including: Coronary artery atherosclerosis. Aortic Atherosclerosis (ICD10-I70.0). Trace bilateral pleural fluid. Mild thoracic compression  deformities. Electronically Signed   By: Jeronimo Greaves M.D.   On: 12/25/2022 16:19   DG Chest Portable 1 View  Result Date: 12/24/2022 CLINICAL DATA:  Cough EXAM: PORTABLE CHEST 1 VIEW COMPARISON:  12/09/2022 FINDINGS: The heart size and mediastinal contours are within normal limits. Both lungs are clear. The visualized skeletal structures are unremarkable. IMPRESSION: No active disease. Electronically Signed   By: Charlett Nose M.D.   On: 12/24/2022 17:40   ECHOCARDIOGRAM COMPLETE  Result Date: 12/08/2022    ECHOCARDIOGRAM REPORT   Patient Name:   AMEA MCPHAIL Kiernan Date of Exam: 12/08/2022 Medical Rec #:  [id number]    Height:       66.0 in Accession #:    [id number]   Weight:       144.8 lb Date of Birth:  1932/07/23     BSA:  PROGRESS NOTE  [name] [name]:[id number] DOB: [birth year] DOA: 12/24/2022 PCP: Kerri Perches, MD  Brief History:   89 y.o. female with medical history significant for NASH, DVT, hypertension, HFpEF.  Patient brought to the ED by family with reports of progressive generalized weakness.  The patient was recently mid to the hospital from 12/07/2022 to 12/09/2022 due to acute on chronic HFpEF.  She was discharged home with furosemide 20 mg daily.  The patient lives alone, but family member check on her frequently.  Since discharge from the hospital, the patient visited the ED at [facility] on 12/09/22 because of generalized weakness and near syncope.  The patient was diagnosed with a UTI and sent home with Macrobid. Over the past 2 to 3 days, the patient has had worsening generalized weakness and cough with poor oral intake.  The patient took a home COVID test which was positive.  The patient denies any fevers, chills, chest pain, shortness breath, nausea, vomiting, diarrhea, abdominal pain. At baseline, the patient has poor functioning.  She is able to get out of bed with assistance and transfer to wheelchair.  However she has been weaker to the point where she is unable to get out of bed.  Because of worsening weakness, the patient presented for further evaluation and treatment. In the ED, the patient was afebrile hemodynamically stable with oxygen saturation 100% room air.  WBC 5.3, hemoglobin 10.6, platelets 226,000.  Sodium 141, potassium 4.5, bicarbonate 27, serum creatinine 0.63.     Assessment/Plan:  Failure to thrive/generalized weakness -Secondary to COVID-19 infection -Check B12--781 -TSH-3.449 -Folic acid--12.3 -PT evaluation>>SNF -UA negative for pyuria -liberalize diet   COVID-19 infection -Stable on room air -10/4 CTA chest--Neg PE, no consolidation or GGO -tolerating diet   Stage III sacral cubitus ulcer -Does not appear infected on examination -Wound care consult  appreciated-- to absorb drainage and promote healing: 1. Apply Aquacel [place] # [phone]) to left buttock wound Q day, using swb to fill, then cover with foam dresing.    Hypokalemia -Repleted -Check magnesium--2.3 initially   Chronic HFpEF -Clinically compensated -Continue furosemide 20 mg daily -12/08/2022 echo EF 50%, grade 1 DD, trivial MR   Recurrent lower extremity DVT -Continue apixaban   Venous stasis dermatitis -Consult wound care for Unna boot dressings ---Change dressings to bilat legs Q M/W/F as follows: Apply Aquacel [place] # [phone]) to wounds on legs and cover with ABD pads and kerlex in a spiral fashion, beginning just behind toes to below knees, then cover with ace wrap in the same manner.    Essential hypertension -stable -does not appear to be on meds at home           Family Communication:  sister updated 10/5  Consultants:  none  Code Status:  FULL   DVT Prophylaxis:  apixaban   Procedures: As Listed in Progress Note Above  Antibiotics: None       Subjective: Patient denies fevers, chills, headache, chest pain, dyspnea, nausea, vomiting, diarrhea, abdominal pain, dysuria, hematuria, hematochezia, and melena. Complains of leg/feet pain, left greater than right.  Objective: Vitals:   12/26/22 1252 12/26/22 2100 12/27/22 0500 12/27/22 1329  BP: 109/63 132/71 122/69 (!) 109/53  Pulse: 84 88  76  Resp: 17 18 16 20   Temp: 98.3 F (36.8 C) 98.3 F (36.8 C) 98.2 F (36.8 C)   TempSrc:  Oral    SpO2: 98% 99% 98% 100%  Weight:  Patients:   [email or web address]   Fact Sheet for Healthcare Providers:   http://kim-miller.com/    This test is not yet approved or cleared by the [place] FDA and  has been authorized for detection and/or diagnosis of SARS-CoV-2 by FDA under an Emergency Use Authorization (EUA).  This EUA will remain in effect (meaning this test can be used) for the duration of  the COVID-19 declaration under Section 564(b)(1)  of the Act, 21 U.S.C. section 360-bbb-3(b)(1), unless the authorization is terminated or revoked sooner.   Performed at [facility], [address], [place], [place] [address]      Scheduled Meds:  acetaminophen  1,000 mg Oral Q8H   apixaban  5 mg Oral BID   feeding supplement  237 mL Oral BID BM   folic acid  1 mg Oral Daily   furosemide  20 mg Oral QODAY   gabapentin  100-200 mg Oral BID   mirabegron ER  25 mg Oral Daily   multivitamin with minerals  1 tablet Oral Daily   Continuous  Infusions:  Procedures/Studies: CT Angio Chest Pulmonary Embolism (PE) W or WO Contrast  Result Date: 12/25/2022 CLINICAL DATA:  Positive D-dimer.  COVID-19. EXAM: CT ANGIOGRAPHY CHEST WITH CONTRAST TECHNIQUE: Multidetector CT imaging of the chest was performed using the standard protocol during bolus administration of intravenous contrast. Multiplanar CT image reconstructions and MIPs were obtained to evaluate the vascular anatomy. RADIATION DOSE REDUCTION: This exam was performed according to the departmental dose-optimization program which includes automated exposure control, adjustment of the mA and/or kV according to patient size and/or use of iterative reconstruction technique. CONTRAST:  75mL OMNIPAQUE IOHEXOL 350 MG/ML SOLN COMPARISON:  12/24/2022 chest radiograph. No prior CT. FINDINGS: Cardiovascular: The quality of this exam for evaluation of pulmonary embolism is good. No evidence of pulmonary embolism. Aortic atherosclerosis. Tortuous thoracic aorta. Mild cardiomegaly, without pericardial effusion. Left main and LAD coronary artery calcification. Mediastinum/Nodes: No mediastinal or hilar adenopathy. Lungs/Pleura: Trace bilateral pleural fluid. Left lower lobe hypoattenuation laterally is likely due to prior aspiration of contrast including on 51/6. Subsegmental atelectasis in both lower lobes. Upper Abdomen: High right hepatic lobe 1.4 cm cyst. Normal imaged portions of the spleen, stomach, pancreas, adrenal glands, kidneys. Cholecystectomy. Musculoskeletal: Accentuation of expected thoracic kyphosis. Mild superior endplate compression deformities at T5 and T6 without ventral canal encroachment. Review of the MIP images confirms the above findings. IMPRESSION: 1.  No evidence of pulmonary embolism. 2. No evidence of COVID-19 pneumonia. 3. Incidental findings, including: Coronary artery atherosclerosis. Aortic Atherosclerosis (ICD10-I70.0). Trace bilateral pleural fluid. Mild thoracic compression  deformities. Electronically Signed   By: Jeronimo Greaves M.D.   On: 12/25/2022 16:19   DG Chest Portable 1 View  Result Date: 12/24/2022 CLINICAL DATA:  Cough EXAM: PORTABLE CHEST 1 VIEW COMPARISON:  12/09/2022 FINDINGS: The heart size and mediastinal contours are within normal limits. Both lungs are clear. The visualized skeletal structures are unremarkable. IMPRESSION: No active disease. Electronically Signed   By: Charlett Nose M.D.   On: 12/24/2022 17:40   ECHOCARDIOGRAM COMPLETE  Result Date: 12/08/2022    ECHOCARDIOGRAM REPORT   Patient Name:   AMEA MCPHAIL Kiernan Date of Exam: 12/08/2022 Medical Rec #:  [id number]    Height:       66.0 in Accession #:    [id number]   Weight:       144.8 lb Date of Birth:  1932/07/23     BSA:  PROGRESS NOTE  [name] [name]:[id number] DOB: [birth year] DOA: 12/24/2022 PCP: Kerri Perches, MD  Brief History:   89 y.o. female with medical history significant for NASH, DVT, hypertension, HFpEF.  Patient brought to the ED by family with reports of progressive generalized weakness.  The patient was recently mid to the hospital from 12/07/2022 to 12/09/2022 due to acute on chronic HFpEF.  She was discharged home with furosemide 20 mg daily.  The patient lives alone, but family member check on her frequently.  Since discharge from the hospital, the patient visited the ED at [facility] on 12/09/22 because of generalized weakness and near syncope.  The patient was diagnosed with a UTI and sent home with Macrobid. Over the past 2 to 3 days, the patient has had worsening generalized weakness and cough with poor oral intake.  The patient took a home COVID test which was positive.  The patient denies any fevers, chills, chest pain, shortness breath, nausea, vomiting, diarrhea, abdominal pain. At baseline, the patient has poor functioning.  She is able to get out of bed with assistance and transfer to wheelchair.  However she has been weaker to the point where she is unable to get out of bed.  Because of worsening weakness, the patient presented for further evaluation and treatment. In the ED, the patient was afebrile hemodynamically stable with oxygen saturation 100% room air.  WBC 5.3, hemoglobin 10.6, platelets 226,000.  Sodium 141, potassium 4.5, bicarbonate 27, serum creatinine 0.63.     Assessment/Plan:  Failure to thrive/generalized weakness -Secondary to COVID-19 infection -Check B12--781 -TSH-3.449 -Folic acid--12.3 -PT evaluation>>SNF -UA negative for pyuria -liberalize diet   COVID-19 infection -Stable on room air -10/4 CTA chest--Neg PE, no consolidation or GGO -tolerating diet   Stage III sacral cubitus ulcer -Does not appear infected on examination -Wound care consult  appreciated-- to absorb drainage and promote healing: 1. Apply Aquacel [place] # [phone]) to left buttock wound Q day, using swb to fill, then cover with foam dresing.    Hypokalemia -Repleted -Check magnesium--2.3 initially   Chronic HFpEF -Clinically compensated -Continue furosemide 20 mg daily -12/08/2022 echo EF 50%, grade 1 DD, trivial MR   Recurrent lower extremity DVT -Continue apixaban   Venous stasis dermatitis -Consult wound care for Unna boot dressings ---Change dressings to bilat legs Q M/W/F as follows: Apply Aquacel [place] # [phone]) to wounds on legs and cover with ABD pads and kerlex in a spiral fashion, beginning just behind toes to below knees, then cover with ace wrap in the same manner.    Essential hypertension -stable -does not appear to be on meds at home           Family Communication:  sister updated 10/5  Consultants:  none  Code Status:  FULL   DVT Prophylaxis:  apixaban   Procedures: As Listed in Progress Note Above  Antibiotics: None       Subjective: Patient denies fevers, chills, headache, chest pain, dyspnea, nausea, vomiting, diarrhea, abdominal pain, dysuria, hematuria, hematochezia, and melena. Complains of leg/feet pain, left greater than right.  Objective: Vitals:   12/26/22 1252 12/26/22 2100 12/27/22 0500 12/27/22 1329  BP: 109/63 132/71 122/69 (!) 109/53  Pulse: 84 88  76  Resp: 17 18 16 20   Temp: 98.3 F (36.8 C) 98.3 F (36.8 C) 98.2 F (36.8 C)   TempSrc:  Oral    SpO2: 98% 99% 98% 100%  Weight:

## 2022-12-27 NOTE — Plan of Care (Signed)

## 2022-12-28 DIAGNOSIS — U071 COVID-19: Secondary | ICD-10-CM | POA: Diagnosis not present

## 2022-12-28 DIAGNOSIS — I10 Essential (primary) hypertension: Secondary | ICD-10-CM | POA: Diagnosis not present

## 2022-12-28 DIAGNOSIS — R627 Adult failure to thrive: Secondary | ICD-10-CM | POA: Diagnosis not present

## 2022-12-28 DIAGNOSIS — I5032 Chronic diastolic (congestive) heart failure: Secondary | ICD-10-CM | POA: Diagnosis not present

## 2022-12-28 LAB — BASIC METABOLIC PANEL
Anion gap: 9 (ref 5–15)
BUN: 20 mg/dL (ref 8–23)
CO2: 25 mmol/L (ref 22–32)
Calcium: 8.4 mg/dL — ABNORMAL LOW (ref 8.9–10.3)
Chloride: 107 mmol/L (ref 98–111)
Creatinine, Ser: 0.73 mg/dL (ref 0.44–1.00)
GFR, Estimated: >60 mL/min (ref >60)
Glucose, Bld: 96 mg/dL (ref 70–99)
Potassium: 3.4 mmol/L — ABNORMAL LOW (ref 3.5–5.1)
Sodium: 141 mmol/L (ref 135–145)

## 2022-12-28 LAB — C-REACTIVE PROTEIN: CRP: 1.3 mg/dL — ABNORMAL HIGH (ref <1.0)

## 2022-12-28 LAB — GLUCOSE, CAPILLARY: Glucose-Capillary: 120 mg/dL — ABNORMAL HIGH (ref 70–99)

## 2022-12-28 LAB — MAGNESIUM: Magnesium: 2 mg/dL (ref 1.7–2.4)

## 2022-12-28 MED ORDER — ACETAMINOPHEN 500 MG PO TABS: 1000.0000 mg | ORAL_TABLET | Freq: Three times a day (TID) | ORAL | Status: DC

## 2022-12-28 MED ORDER — POTASSIUM CHLORIDE CRYS ER 20 MEQ PO TBCR
40.0000 meq | EXTENDED_RELEASE_TABLET | Freq: Once | ORAL | Status: AC
  Administered 2022-12-28: 40 meq via ORAL
  Filled 2022-12-28: qty 2

## 2022-12-28 MED ORDER — ORAL CARE MOUTH RINSE: 15.0000 mL | OROMUCOSAL | Status: DC | PRN

## 2022-12-28 NOTE — Discharge Summary (Signed)
Physician Discharge Summary   Patient: [name] [name] MRN: [id number] DOB: 10-06-1932  Admit date:     12/24/2022  Discharge date: 12/28/22  Discharge Physician: Onalee Hua Eilidh Marcano   PCP: Kerri Perches, MD   Recommendations at discharge:   Please follow up with primary care provider within 1-2 weeks  Please repeat BMP and CBC in one week     Hospital Course:  89 y.o. female with medical history significant for NASH, DVT, hypertension, HFpEF.  Patient brought to the ED by family with reports of progressive generalized weakness.  The patient was recently mid to the hospital from 12/07/2022 to 12/09/2022 due to acute on chronic HFpEF.  She was discharged home with furosemide 20 mg daily.  The patient lives alone, but family member check on her frequently.  Since discharge from the hospital, the patient visited the ED at [facility] on 12/09/22 because of generalized weakness and near syncope.  The patient was diagnosed with a UTI and sent home with Macrobid. Over the past 2 to 3 days, the patient has had worsening generalized weakness and cough with poor oral intake.  The patient took a home COVID test which was positive.  The patient denies any fevers, chills, chest pain, shortness breath, nausea, vomiting, diarrhea, abdominal pain. At baseline, the patient has poor functioning.  She is able to get out of bed with assistance and transfer to wheelchair.  However she has been weaker to the point where she is unable to get out of bed.  Because of worsening weakness, the patient presented for further evaluation and treatment. In the ED, the patient was afebrile hemodynamically stable with oxygen saturation 100% room air.  WBC 5.3, hemoglobin 10.6, platelets 226,000.  Sodium 141, potassium 4.5, bicarbonate 27, serum creatinine 0.63. The patient remained clinically stable through the hospitalization.  She was stable on RA without any increase WOB and tolerated her diet without any n/v/d.   PT recommended SNF.   Hospitalization was prolonged due to financial/social circumstance surrounding SNF search for the patient.    Assessment and Plan: Failure to thrive/generalized weakness -Secondary to COVID-19 infection -Check B12--781 -TSH-3.449 -Folic acid--12.3 -PT evaluation>>SNF -UA negative for pyuria -liberalized diet   COVID-19 infection -Stable on room air -10/4 CTA chest--Neg PE, no consolidation or GGO -tolerating diet   Stage III sacral cubitus ulcer -Does not appear infected on examination -Wound care consult appreciated-- to absorb drainage and promote healing: 1. Apply Aquacel [place] # [phone]) to left buttock wound Q day, using swb to fill, then cover with foam dresing.    Hypokalemia -Repleted -Check magnesium--2.0   Chronic HFpEF -Clinically compensated -Continue furosemide 20 mg daily -12/08/2022 echo EF 50%, grade 1 DD, trivial MR   Recurrent lower extremity DVT -Continue apixaban   Venous stasis dermatitis -Consult wound care for Unna boot dressings ---Change dressings to bilat legs Q M/W/F as follows: Apply Aquacel [place] # [phone]) to wounds on legs and cover with ABD pads and kerlex in a spiral fashion, beginning just behind toes to below knees, then cover with ace wrap in the same manner.    Essential hypertension -stable -does not appear to be on meds at home               Consultants: none Procedures performed: none  Disposition: Skilled nursing facility Diet recommendation:  Regular diet DISCHARGE MEDICATION: Allergies as of 12/28/2022       Reactions   Ciprofloxacin Hcl Other (See Comments)   Chills, Sick  Physician Discharge Summary   Patient: [name] [name] MRN: [id number] DOB: 10-06-1932  Admit date:     12/24/2022  Discharge date: 12/28/22  Discharge Physician: Onalee Hua Eilidh Marcano   PCP: Kerri Perches, MD   Recommendations at discharge:   Please follow up with primary care provider within 1-2 weeks  Please repeat BMP and CBC in one week     Hospital Course:  89 y.o. female with medical history significant for NASH, DVT, hypertension, HFpEF.  Patient brought to the ED by family with reports of progressive generalized weakness.  The patient was recently mid to the hospital from 12/07/2022 to 12/09/2022 due to acute on chronic HFpEF.  She was discharged home with furosemide 20 mg daily.  The patient lives alone, but family member check on her frequently.  Since discharge from the hospital, the patient visited the ED at [facility] on 12/09/22 because of generalized weakness and near syncope.  The patient was diagnosed with a UTI and sent home with Macrobid. Over the past 2 to 3 days, the patient has had worsening generalized weakness and cough with poor oral intake.  The patient took a home COVID test which was positive.  The patient denies any fevers, chills, chest pain, shortness breath, nausea, vomiting, diarrhea, abdominal pain. At baseline, the patient has poor functioning.  She is able to get out of bed with assistance and transfer to wheelchair.  However she has been weaker to the point where she is unable to get out of bed.  Because of worsening weakness, the patient presented for further evaluation and treatment. In the ED, the patient was afebrile hemodynamically stable with oxygen saturation 100% room air.  WBC 5.3, hemoglobin 10.6, platelets 226,000.  Sodium 141, potassium 4.5, bicarbonate 27, serum creatinine 0.63. The patient remained clinically stable through the hospitalization.  She was stable on RA without any increase WOB and tolerated her diet without any n/v/d.   PT recommended SNF.   Hospitalization was prolonged due to financial/social circumstance surrounding SNF search for the patient.    Assessment and Plan: Failure to thrive/generalized weakness -Secondary to COVID-19 infection -Check B12--781 -TSH-3.449 -Folic acid--12.3 -PT evaluation>>SNF -UA negative for pyuria -liberalized diet   COVID-19 infection -Stable on room air -10/4 CTA chest--Neg PE, no consolidation or GGO -tolerating diet   Stage III sacral cubitus ulcer -Does not appear infected on examination -Wound care consult appreciated-- to absorb drainage and promote healing: 1. Apply Aquacel [place] # [phone]) to left buttock wound Q day, using swb to fill, then cover with foam dresing.    Hypokalemia -Repleted -Check magnesium--2.0   Chronic HFpEF -Clinically compensated -Continue furosemide 20 mg daily -12/08/2022 echo EF 50%, grade 1 DD, trivial MR   Recurrent lower extremity DVT -Continue apixaban   Venous stasis dermatitis -Consult wound care for Unna boot dressings ---Change dressings to bilat legs Q M/W/F as follows: Apply Aquacel [place] # [phone]) to wounds on legs and cover with ABD pads and kerlex in a spiral fashion, beginning just behind toes to below knees, then cover with ace wrap in the same manner.    Essential hypertension -stable -does not appear to be on meds at home               Consultants: none Procedures performed: none  Disposition: Skilled nursing facility Diet recommendation:  Regular diet DISCHARGE MEDICATION: Allergies as of 12/28/2022       Reactions   Ciprofloxacin Hcl Other (See Comments)   Chills, Sick  stable  The results of significant diagnostics from this hospitalization (including imaging, microbiology, ancillary and laboratory) are listed below for reference.   Imaging Studies: CT Angio Chest Pulmonary Embolism (PE) W or WO Contrast  Result Date: 12/25/2022 CLINICAL DATA:  Positive D-dimer.  COVID-19. EXAM: CT ANGIOGRAPHY CHEST WITH CONTRAST TECHNIQUE: Multidetector CT imaging of the chest was performed using the standard protocol during bolus administration of intravenous contrast. Multiplanar CT image reconstructions and MIPs were obtained to evaluate the vascular anatomy. RADIATION DOSE REDUCTION: This exam was performed according to the departmental dose-optimization program which includes automated exposure control, adjustment of the mA and/or kV according to patient size and/or use of iterative reconstruction technique. CONTRAST:  75mL OMNIPAQUE IOHEXOL 350 MG/ML SOLN COMPARISON:  12/24/2022 chest radiograph. No prior CT. FINDINGS: Cardiovascular: The  quality of this exam for evaluation of pulmonary embolism is good. No evidence of pulmonary embolism. Aortic atherosclerosis. Tortuous thoracic aorta. Mild cardiomegaly, without pericardial effusion. Left main and LAD coronary artery calcification. Mediastinum/Nodes: No mediastinal or hilar adenopathy. Lungs/Pleura: Trace bilateral pleural fluid. Left lower lobe hypoattenuation laterally is likely due to prior aspiration of contrast including on 51/6. Subsegmental atelectasis in both lower lobes. Upper Abdomen: High right hepatic lobe 1.4 cm cyst. Normal imaged portions of the spleen, stomach, pancreas, adrenal glands, kidneys. Cholecystectomy. Musculoskeletal: Accentuation of expected thoracic kyphosis. Mild superior endplate compression deformities at T5 and T6 without ventral canal encroachment. Review of the MIP images confirms the above findings. IMPRESSION: 1.  No evidence of pulmonary embolism. 2. No evidence of COVID-19 pneumonia. 3. Incidental findings, including: Coronary artery atherosclerosis. Aortic Atherosclerosis (ICD10-I70.0). Trace bilateral pleural fluid. Mild thoracic compression deformities. Electronically Signed   By: Jeronimo Greaves M.D.   On: 12/25/2022 16:19   DG Chest Portable 1 View  Result Date: 12/24/2022 CLINICAL DATA:  Cough EXAM: PORTABLE CHEST 1 VIEW COMPARISON:  12/09/2022 FINDINGS: The heart size and mediastinal contours are within normal limits. Both lungs are clear. The visualized skeletal structures are unremarkable. IMPRESSION: No active disease. Electronically Signed   By: Charlett Nose M.D.   On: 12/24/2022 17:40   ECHOCARDIOGRAM COMPLETE  Result Date: 12/08/2022    ECHOCARDIOGRAM REPORT   Patient Name:   SHABNAM LADD Merlo Date of Exam: 12/08/2022 Medical Rec #:  [id number]    Height:       66.0 in Accession #:    [id number]   Weight:       144.8 lb Date of Birth:  1932/10/27     BSA:          1.744 m Patient Age:    89 years     BP:           145/94 mmHg Patient Gender: F             HR:           93 bpm. [facility] Procedure: 2D Echo, Cardiac Doppler and Color Doppler Indications:    CHF  History:        Patient has prior history of Echocardiogram examinations, most                 recent 09/22/2021. CHF, Signs/Symptoms:Edema; Risk                 Factors:Hypertension and Dyslipidemia.  Sonographer:    Mikki Harbor Referring Phys: [id number] ASIA B ZIERLE-GHOSH  Sonographer Comments: Technically difficult study due to poor echo windows and suboptimal subcostal window. IMPRESSIONS  1.  stable  The results of significant diagnostics from this hospitalization (including imaging, microbiology, ancillary and laboratory) are listed below for reference.   Imaging Studies: CT Angio Chest Pulmonary Embolism (PE) W or WO Contrast  Result Date: 12/25/2022 CLINICAL DATA:  Positive D-dimer.  COVID-19. EXAM: CT ANGIOGRAPHY CHEST WITH CONTRAST TECHNIQUE: Multidetector CT imaging of the chest was performed using the standard protocol during bolus administration of intravenous contrast. Multiplanar CT image reconstructions and MIPs were obtained to evaluate the vascular anatomy. RADIATION DOSE REDUCTION: This exam was performed according to the departmental dose-optimization program which includes automated exposure control, adjustment of the mA and/or kV according to patient size and/or use of iterative reconstruction technique. CONTRAST:  75mL OMNIPAQUE IOHEXOL 350 MG/ML SOLN COMPARISON:  12/24/2022 chest radiograph. No prior CT. FINDINGS: Cardiovascular: The  quality of this exam for evaluation of pulmonary embolism is good. No evidence of pulmonary embolism. Aortic atherosclerosis. Tortuous thoracic aorta. Mild cardiomegaly, without pericardial effusion. Left main and LAD coronary artery calcification. Mediastinum/Nodes: No mediastinal or hilar adenopathy. Lungs/Pleura: Trace bilateral pleural fluid. Left lower lobe hypoattenuation laterally is likely due to prior aspiration of contrast including on 51/6. Subsegmental atelectasis in both lower lobes. Upper Abdomen: High right hepatic lobe 1.4 cm cyst. Normal imaged portions of the spleen, stomach, pancreas, adrenal glands, kidneys. Cholecystectomy. Musculoskeletal: Accentuation of expected thoracic kyphosis. Mild superior endplate compression deformities at T5 and T6 without ventral canal encroachment. Review of the MIP images confirms the above findings. IMPRESSION: 1.  No evidence of pulmonary embolism. 2. No evidence of COVID-19 pneumonia. 3. Incidental findings, including: Coronary artery atherosclerosis. Aortic Atherosclerosis (ICD10-I70.0). Trace bilateral pleural fluid. Mild thoracic compression deformities. Electronically Signed   By: Jeronimo Greaves M.D.   On: 12/25/2022 16:19   DG Chest Portable 1 View  Result Date: 12/24/2022 CLINICAL DATA:  Cough EXAM: PORTABLE CHEST 1 VIEW COMPARISON:  12/09/2022 FINDINGS: The heart size and mediastinal contours are within normal limits. Both lungs are clear. The visualized skeletal structures are unremarkable. IMPRESSION: No active disease. Electronically Signed   By: Charlett Nose M.D.   On: 12/24/2022 17:40   ECHOCARDIOGRAM COMPLETE  Result Date: 12/08/2022    ECHOCARDIOGRAM REPORT   Patient Name:   SHABNAM LADD Merlo Date of Exam: 12/08/2022 Medical Rec #:  [id number]    Height:       66.0 in Accession #:    [id number]   Weight:       144.8 lb Date of Birth:  1932/10/27     BSA:          1.744 m Patient Age:    89 years     BP:           145/94 mmHg Patient Gender: F             HR:           93 bpm. [facility] Procedure: 2D Echo, Cardiac Doppler and Color Doppler Indications:    CHF  History:        Patient has prior history of Echocardiogram examinations, most                 recent 09/22/2021. CHF, Signs/Symptoms:Edema; Risk                 Factors:Hypertension and Dyslipidemia.  Sonographer:    Mikki Harbor Referring Phys: [id number] ASIA B ZIERLE-GHOSH  Sonographer Comments: Technically difficult study due to poor echo windows and suboptimal subcostal window. IMPRESSIONS  1.  stable  The results of significant diagnostics from this hospitalization (including imaging, microbiology, ancillary and laboratory) are listed below for reference.   Imaging Studies: CT Angio Chest Pulmonary Embolism (PE) W or WO Contrast  Result Date: 12/25/2022 CLINICAL DATA:  Positive D-dimer.  COVID-19. EXAM: CT ANGIOGRAPHY CHEST WITH CONTRAST TECHNIQUE: Multidetector CT imaging of the chest was performed using the standard protocol during bolus administration of intravenous contrast. Multiplanar CT image reconstructions and MIPs were obtained to evaluate the vascular anatomy. RADIATION DOSE REDUCTION: This exam was performed according to the departmental dose-optimization program which includes automated exposure control, adjustment of the mA and/or kV according to patient size and/or use of iterative reconstruction technique. CONTRAST:  75mL OMNIPAQUE IOHEXOL 350 MG/ML SOLN COMPARISON:  12/24/2022 chest radiograph. No prior CT. FINDINGS: Cardiovascular: The  quality of this exam for evaluation of pulmonary embolism is good. No evidence of pulmonary embolism. Aortic atherosclerosis. Tortuous thoracic aorta. Mild cardiomegaly, without pericardial effusion. Left main and LAD coronary artery calcification. Mediastinum/Nodes: No mediastinal or hilar adenopathy. Lungs/Pleura: Trace bilateral pleural fluid. Left lower lobe hypoattenuation laterally is likely due to prior aspiration of contrast including on 51/6. Subsegmental atelectasis in both lower lobes. Upper Abdomen: High right hepatic lobe 1.4 cm cyst. Normal imaged portions of the spleen, stomach, pancreas, adrenal glands, kidneys. Cholecystectomy. Musculoskeletal: Accentuation of expected thoracic kyphosis. Mild superior endplate compression deformities at T5 and T6 without ventral canal encroachment. Review of the MIP images confirms the above findings. IMPRESSION: 1.  No evidence of pulmonary embolism. 2. No evidence of COVID-19 pneumonia. 3. Incidental findings, including: Coronary artery atherosclerosis. Aortic Atherosclerosis (ICD10-I70.0). Trace bilateral pleural fluid. Mild thoracic compression deformities. Electronically Signed   By: Jeronimo Greaves M.D.   On: 12/25/2022 16:19   DG Chest Portable 1 View  Result Date: 12/24/2022 CLINICAL DATA:  Cough EXAM: PORTABLE CHEST 1 VIEW COMPARISON:  12/09/2022 FINDINGS: The heart size and mediastinal contours are within normal limits. Both lungs are clear. The visualized skeletal structures are unremarkable. IMPRESSION: No active disease. Electronically Signed   By: Charlett Nose M.D.   On: 12/24/2022 17:40   ECHOCARDIOGRAM COMPLETE  Result Date: 12/08/2022    ECHOCARDIOGRAM REPORT   Patient Name:   SHABNAM LADD Merlo Date of Exam: 12/08/2022 Medical Rec #:  [id number]    Height:       66.0 in Accession #:    [id number]   Weight:       144.8 lb Date of Birth:  1932/10/27     BSA:          1.744 m Patient Age:    89 years     BP:           145/94 mmHg Patient Gender: F             HR:           93 bpm. [facility] Procedure: 2D Echo, Cardiac Doppler and Color Doppler Indications:    CHF  History:        Patient has prior history of Echocardiogram examinations, most                 recent 09/22/2021. CHF, Signs/Symptoms:Edema; Risk                 Factors:Hypertension and Dyslipidemia.  Sonographer:    Mikki Harbor Referring Phys: [id number] ASIA B ZIERLE-GHOSH  Sonographer Comments: Technically difficult study due to poor echo windows and suboptimal subcostal window. IMPRESSIONS  1.  Physician Discharge Summary   Patient: [name] [name] MRN: [id number] DOB: 10-06-1932  Admit date:     12/24/2022  Discharge date: 12/28/22  Discharge Physician: Onalee Hua Eilidh Marcano   PCP: Kerri Perches, MD   Recommendations at discharge:   Please follow up with primary care provider within 1-2 weeks  Please repeat BMP and CBC in one week     Hospital Course:  89 y.o. female with medical history significant for NASH, DVT, hypertension, HFpEF.  Patient brought to the ED by family with reports of progressive generalized weakness.  The patient was recently mid to the hospital from 12/07/2022 to 12/09/2022 due to acute on chronic HFpEF.  She was discharged home with furosemide 20 mg daily.  The patient lives alone, but family member check on her frequently.  Since discharge from the hospital, the patient visited the ED at [facility] on 12/09/22 because of generalized weakness and near syncope.  The patient was diagnosed with a UTI and sent home with Macrobid. Over the past 2 to 3 days, the patient has had worsening generalized weakness and cough with poor oral intake.  The patient took a home COVID test which was positive.  The patient denies any fevers, chills, chest pain, shortness breath, nausea, vomiting, diarrhea, abdominal pain. At baseline, the patient has poor functioning.  She is able to get out of bed with assistance and transfer to wheelchair.  However she has been weaker to the point where she is unable to get out of bed.  Because of worsening weakness, the patient presented for further evaluation and treatment. In the ED, the patient was afebrile hemodynamically stable with oxygen saturation 100% room air.  WBC 5.3, hemoglobin 10.6, platelets 226,000.  Sodium 141, potassium 4.5, bicarbonate 27, serum creatinine 0.63. The patient remained clinically stable through the hospitalization.  She was stable on RA without any increase WOB and tolerated her diet without any n/v/d.   PT recommended SNF.   Hospitalization was prolonged due to financial/social circumstance surrounding SNF search for the patient.    Assessment and Plan: Failure to thrive/generalized weakness -Secondary to COVID-19 infection -Check B12--781 -TSH-3.449 -Folic acid--12.3 -PT evaluation>>SNF -UA negative for pyuria -liberalized diet   COVID-19 infection -Stable on room air -10/4 CTA chest--Neg PE, no consolidation or GGO -tolerating diet   Stage III sacral cubitus ulcer -Does not appear infected on examination -Wound care consult appreciated-- to absorb drainage and promote healing: 1. Apply Aquacel [place] # [phone]) to left buttock wound Q day, using swb to fill, then cover with foam dresing.    Hypokalemia -Repleted -Check magnesium--2.0   Chronic HFpEF -Clinically compensated -Continue furosemide 20 mg daily -12/08/2022 echo EF 50%, grade 1 DD, trivial MR   Recurrent lower extremity DVT -Continue apixaban   Venous stasis dermatitis -Consult wound care for Unna boot dressings ---Change dressings to bilat legs Q M/W/F as follows: Apply Aquacel [place] # [phone]) to wounds on legs and cover with ABD pads and kerlex in a spiral fashion, beginning just behind toes to below knees, then cover with ace wrap in the same manner.    Essential hypertension -stable -does not appear to be on meds at home               Consultants: none Procedures performed: none  Disposition: Skilled nursing facility Diet recommendation:  Regular diet DISCHARGE MEDICATION: Allergies as of 12/28/2022       Reactions   Ciprofloxacin Hcl Other (See Comments)   Chills, Sick

## 2022-12-28 NOTE — TOC Progression Note (Signed)
Transition of Care [facility]) - Progression Note    Patient Details  Name: [name] [name] MRN: [id number] Date of Birth: June 09, 1932  [facility] CM/SW Contact  Elliot Gault, LCSW Phone Number: 12/28/2022, 3:59 PM  Clinical Narrative:     TOC following for dc planning. Have updated pt that her only other bed offer (other than [facility]) is from [place]. Pt wants TOC to discuss with her son. Have been unable to reach pt's son after multiple attempts today. Pt aware that if she does not accept [place], then option is for dc home with [facility]. Auth for SNF expires tomorrow. Pt aware she is medically stable for dc and plan is for dc tomorrow.  Updated MD and RN. Will follow up in AM.  Expected Discharge Plan: Skilled Nursing Facility Barriers to Discharge: SNF Pending bed offer  Expected Discharge Plan and Services In-house Referral: Clinical Social Work     Living arrangements for the past 2 months: Single Family Home Expected Discharge Date: 12/26/22                                     Social Determinants of Health (SDOH) Interventions SDOH Screenings   Food Insecurity: No Food Insecurity (12/24/2022)  Housing: Low Risk  (12/24/2022)  Recent Concern: Housing - Medium Risk (12/08/2022)  Transportation Needs: No Transportation Needs (12/24/2022)  Utilities: Not At Risk (12/24/2022)  Alcohol Screen: Low Risk  (01/23/2021)  Depression (PHQ2-9): Low Risk  (11/10/2022)  Financial Resource Strain: Low Risk  (01/23/2021)  Physical Activity: Insufficiently Active (01/23/2021)  Social Connections: Socially Isolated (01/23/2021)  Stress: No Stress Concern Present (01/23/2021)  Tobacco Use: Low Risk  (12/24/2022)    Readmission Risk Interventions     No data to display

## 2022-12-28 NOTE — Plan of Care (Signed)

## 2022-12-29 DIAGNOSIS — G9340 Encephalopathy, unspecified: Secondary | ICD-10-CM | POA: Diagnosis not present

## 2022-12-29 DIAGNOSIS — Z79899 Other long term (current) drug therapy: Secondary | ICD-10-CM | POA: Diagnosis not present

## 2022-12-29 DIAGNOSIS — Z86711 Personal history of pulmonary embolism: Secondary | ICD-10-CM | POA: Diagnosis not present

## 2022-12-29 DIAGNOSIS — R635 Abnormal weight gain: Secondary | ICD-10-CM | POA: Diagnosis not present

## 2022-12-29 DIAGNOSIS — I87312 Chronic venous hypertension (idiopathic) with ulcer of left lower extremity: Secondary | ICD-10-CM | POA: Diagnosis not present

## 2022-12-29 DIAGNOSIS — D649 Anemia, unspecified: Secondary | ICD-10-CM | POA: Diagnosis not present

## 2022-12-29 DIAGNOSIS — D61818 Other pancytopenia: Secondary | ICD-10-CM | POA: Diagnosis not present

## 2022-12-29 DIAGNOSIS — T68XXXA Hypothermia, initial encounter: Secondary | ICD-10-CM | POA: Diagnosis not present

## 2022-12-29 DIAGNOSIS — R627 Adult failure to thrive: Secondary | ICD-10-CM | POA: Diagnosis not present

## 2022-12-29 DIAGNOSIS — Z888 Allergy status to other drugs, medicaments and biological substances status: Secondary | ICD-10-CM | POA: Diagnosis not present

## 2022-12-29 DIAGNOSIS — R5381 Other malaise: Secondary | ICD-10-CM | POA: Diagnosis not present

## 2022-12-29 DIAGNOSIS — I87319 Chronic venous hypertension (idiopathic) with ulcer of unspecified lower extremity: Secondary | ICD-10-CM | POA: Diagnosis not present

## 2022-12-29 DIAGNOSIS — N390 Urinary tract infection, site not specified: Secondary | ICD-10-CM | POA: Diagnosis not present

## 2022-12-29 DIAGNOSIS — R82998 Other abnormal findings in urine: Secondary | ICD-10-CM | POA: Diagnosis not present

## 2022-12-29 DIAGNOSIS — I509 Heart failure, unspecified: Secondary | ICD-10-CM | POA: Diagnosis not present

## 2022-12-29 DIAGNOSIS — M160 Bilateral primary osteoarthritis of hip: Secondary | ICD-10-CM | POA: Diagnosis not present

## 2022-12-29 DIAGNOSIS — I700 Atherosclerosis of aorta: Secondary | ICD-10-CM | POA: Diagnosis not present

## 2022-12-29 DIAGNOSIS — Z886 Allergy status to analgesic agent status: Secondary | ICD-10-CM | POA: Diagnosis not present

## 2022-12-29 DIAGNOSIS — U071 COVID-19: Secondary | ICD-10-CM | POA: Diagnosis not present

## 2022-12-29 DIAGNOSIS — L89153 Pressure ulcer of sacral region, stage 3: Secondary | ICD-10-CM | POA: Diagnosis not present

## 2022-12-29 DIAGNOSIS — I959 Hypotension, unspecified: Secondary | ICD-10-CM | POA: Diagnosis not present

## 2022-12-29 DIAGNOSIS — Z880 Allergy status to penicillin: Secondary | ICD-10-CM | POA: Diagnosis not present

## 2022-12-29 DIAGNOSIS — L97222 Non-pressure chronic ulcer of left calf with fat layer exposed: Secondary | ICD-10-CM | POA: Diagnosis not present

## 2022-12-29 DIAGNOSIS — M25551 Pain in right hip: Secondary | ICD-10-CM | POA: Diagnosis not present

## 2022-12-29 DIAGNOSIS — D696 Thrombocytopenia, unspecified: Secondary | ICD-10-CM | POA: Diagnosis not present

## 2022-12-29 DIAGNOSIS — E876 Hypokalemia: Secondary | ICD-10-CM | POA: Diagnosis not present

## 2022-12-29 DIAGNOSIS — D72819 Decreased white blood cell count, unspecified: Secondary | ICD-10-CM | POA: Diagnosis not present

## 2022-12-29 DIAGNOSIS — R4182 Altered mental status, unspecified: Secondary | ICD-10-CM | POA: Diagnosis not present

## 2022-12-29 DIAGNOSIS — E119 Type 2 diabetes mellitus without complications: Secondary | ICD-10-CM | POA: Diagnosis not present

## 2022-12-29 DIAGNOSIS — Z881 Allergy status to other antibiotic agents status: Secondary | ICD-10-CM | POA: Diagnosis not present

## 2022-12-29 DIAGNOSIS — L97822 Non-pressure chronic ulcer of other part of left lower leg with fat layer exposed: Secondary | ICD-10-CM | POA: Diagnosis not present

## 2022-12-29 DIAGNOSIS — K7581 Nonalcoholic steatohepatitis (NASH): Secondary | ICD-10-CM | POA: Diagnosis not present

## 2022-12-29 DIAGNOSIS — R7889 Finding of other specified substances, not normally found in blood: Secondary | ICD-10-CM | POA: Diagnosis not present

## 2022-12-29 DIAGNOSIS — R531 Weakness: Secondary | ICD-10-CM | POA: Diagnosis not present

## 2022-12-29 DIAGNOSIS — F411 Generalized anxiety disorder: Secondary | ICD-10-CM | POA: Diagnosis not present

## 2022-12-29 DIAGNOSIS — I872 Venous insufficiency (chronic) (peripheral): Secondary | ICD-10-CM | POA: Diagnosis not present

## 2022-12-29 DIAGNOSIS — F4323 Adjustment disorder with mixed anxiety and depressed mood: Secondary | ICD-10-CM | POA: Diagnosis not present

## 2022-12-29 DIAGNOSIS — I5032 Chronic diastolic (congestive) heart failure: Secondary | ICD-10-CM | POA: Diagnosis not present

## 2022-12-29 DIAGNOSIS — R296 Repeated falls: Secondary | ICD-10-CM | POA: Diagnosis not present

## 2022-12-29 DIAGNOSIS — I10 Essential (primary) hypertension: Secondary | ICD-10-CM | POA: Diagnosis not present

## 2022-12-29 NOTE — Plan of Care (Signed)
  Problem: Education: Goal: Knowledge of General Education information will improve Description: Including pain rating scale, medication(s)/side effects and non-pharmacologic comfort measures Outcome: Adequate for Discharge   Problem: Health Behavior/Discharge Planning: Goal: Ability to manage health-related needs will improve Outcome: Adequate for Discharge   Problem: Clinical Measurements: Goal: Ability to maintain clinical measurements within normal limits will improve Outcome: Adequate for Discharge Goal: Will remain free from infection Outcome: Adequate for Discharge Goal: Diagnostic test results will improve Outcome: Adequate for Discharge Goal: Respiratory complications will improve Outcome: Adequate for Discharge Goal: Cardiovascular complication will be avoided Outcome: Adequate for Discharge   Problem: Activity: Goal: Risk for activity intolerance will decrease Outcome: Adequate for Discharge   Problem: Nutrition: Goal: Adequate nutrition will be maintained Outcome: Adequate for Discharge   Problem: Coping: Goal: Level of anxiety will decrease Outcome: Adequate for Discharge   Problem: Elimination: Goal: Will not experience complications related to bowel motility Outcome: Adequate for Discharge Goal: Will not experience complications related to urinary retention Outcome: Adequate for Discharge   Problem: Pain Managment: Goal: General experience of comfort will improve Outcome: Adequate for Discharge   Problem: Safety: Goal: Ability to remain free from injury will improve Outcome: Adequate for Discharge   Problem: Skin Integrity: Goal: Risk for impaired skin integrity will decrease Outcome: Adequate for Discharge   Problem: Education: Goal: Knowledge of risk factors and measures for prevention of condition will improve Outcome: Adequate for Discharge   Problem: Coping: Goal: Psychosocial and spiritual needs will be supported Outcome: Adequate for  Discharge   Problem: Respiratory: Goal: Will maintain a patent airway Outcome: Adequate for Discharge Goal: Complications related to the disease process, condition or treatment will be avoided or minimized Outcome: Adequate for Discharge   Problem: Acute Rehab PT Goals(only PT should resolve) Goal: Pt will Roll Supine to Side Outcome: Adequate for Discharge Goal: Pt Will Go Supine/Side To Sit Outcome: Adequate for Discharge Goal: Pt Will Go Sit To Supine/Side Outcome: Adequate for Discharge Goal: Patient Will Transfer Sit To/From Stand Outcome: Adequate for Discharge Goal: Pt Will Transfer Bed To Chair/Chair To Bed Outcome: Adequate for Discharge

## 2022-12-29 NOTE — Plan of Care (Signed)
  Problem: Activity: Goal: Risk for activity intolerance will decrease Outcome: Progressing   Problem: Coping: Goal: Level of anxiety will decrease Outcome: Progressing   Problem: Pain Managment: Goal: General experience of comfort will improve Outcome: Progressing   

## 2022-12-29 NOTE — TOC Transition Note (Signed)
Transition of Care [facility]) - CM/SW Discharge Note   Patient Details  Name: [name] [name] MRN: [id number] Date of Birth: [birth year]  Transition of Care [facility]) CM/SW Contact:  Elliot Gault, LCSW Phone Number: 12/29/2022, 11:01 AM   Clinical Narrative:     TOC following. Pt and son confirm that pt accepts bed offer at [facility]. Insurance auth updated. Jill Side at [facility] states they can admit pt today.   DC clinical sent electronically. RN to call report. [facility] w/c Zenaida Niece transportation arranged with permission from pt/son.   No other TOC needs for dc.  Final next level of care: Skilled Nursing Facility Barriers to Discharge: Barriers Resolved   Patient Goals and CMS Choice   Choice offered to / list presented to : Patient  Discharge Placement                Patient chooses bed at: [facility] Patient to be transferred to facility by: [facility] Name of family member notified: [name] Patient and family notified of of transfer: 12/29/22  Discharge Plan and Services Additional resources added to the After Visit Summary for   In-house Referral: Clinical Social Work                                   Social Determinants of Health (SDOH) Interventions SDOH Screenings   Food Insecurity: No Food Insecurity (12/24/2022)  Housing: Low Risk  (12/24/2022)  Recent Concern: Housing - Medium Risk (12/08/2022)  Transportation Needs: No Transportation Needs (12/24/2022)  Utilities: Not At Risk (12/24/2022)  Alcohol Screen: Low Risk  (01/23/2021)  Depression (PHQ2-9): Low Risk  (11/10/2022)  Financial Resource Strain: Low Risk  (01/23/2021)  Physical Activity: Insufficiently Active (01/23/2021)  Social Connections: Socially Isolated (01/23/2021)  Stress: No Stress Concern Present (01/23/2021)  Tobacco Use: Low Risk  (12/24/2022)     Readmission Risk Interventions     No data to display

## 2022-12-29 NOTE — Consult Note (Signed)
[facility] Customer service manager [facility]) Accountable Care Organization (ACO) [facility] Liaison Note  12/29/2022  KEELAN POMERLEAU 1932/09/15 [id number]  Location: [facility] RN Hospital Liaison screened the patient remotely at [facility].  Insurance: [employer] HMO   OCTA UPLINGER is a 89 y.o. female who is a Primary Care Patient of Lodema Hong, Milus Mallick, MD ([facility]). The patient was screened for 30 day readmission hospitalization with noted medium risk score for unplanned readmission risk with 3 IP/2 ED in 6 months.  The patient was assessed for potential [facility]) Care Management service needs for post hospital transition for care coordination. Review of patient's electronic medical record reveals patient was admitted with COVID. Pt discharged to SNF level of care and the facility will address pt's ongoing needs.   [facility] Care Management/Population Health does not replace or interfere with any arrangements made by the Inpatient Transition of Care team.   For questions contact:   Elliot Cousin, RN, [facility] Liaison [place]   Population Health Office Hours MTWF  8:00 am-6:00 pm [phone] mobile [phone] [Office toll free line] Office Hours are M-F 8:30 - 5 pm [email or web address]

## 2022-12-29 NOTE — Discharge Summary (Signed)
Physician Discharge Summary   Patient: [name] [name] MRN: [id number] DOB: [birth year]  Admit date:     12/24/2022  Discharge date: 12/29/22  Discharge Physician: Onalee Hua Trevar Boehringer   PCP: Kerri Perches, MD   Recommendations at discharge:   Please follow up with primary care provider within 1-2 weeks  Please repeat BMP and CBC in one week  Discharge Diagnoses: Principal Problem:   Failure to thrive in adult Active Problems:   Hypokalemia   COVID-19 virus infection   Sacral decubitus ulcer, stage III (HCC)   Essential hypertension   Lower extremity edema   Gait abnormality   History of DVT (deep vein thrombosis)   Chronic diastolic CHF (congestive heart failure) (HCC)  Resolved Problems:   * No resolved hospital problems. *  Hospital Course:  89 y.o. female with medical history significant for NASH, DVT, hypertension, HFpEF.  Patient brought to the ED by family with reports of progressive generalized weakness.  The patient was recently mid to the hospital from 12/07/2022 to 12/09/2022 due to acute on chronic HFpEF.  She was discharged home with furosemide 20 mg daily.  The patient lives alone, but family member check on her frequently.  Since discharge from the hospital, the patient visited the ED at [facility] on 12/09/22 because of generalized weakness and near syncope.  The patient was diagnosed with a UTI and sent home with Macrobid. Over the past 2 to 3 days, the patient has had worsening generalized weakness and cough with poor oral intake.  The patient took a home COVID test which was positive.  The patient denies any fevers, chills, chest pain, shortness breath, nausea, vomiting, diarrhea, abdominal pain. At baseline, the patient has poor functioning.  She is able to get out of bed with assistance and transfer to wheelchair.  However she has been weaker to the point where she is unable to get out of bed.  Because of worsening weakness, the patient presented for further evaluation and  treatment. In the ED, the patient was afebrile hemodynamically stable with oxygen saturation 100% room air.  WBC 5.3, hemoglobin 10.6, platelets 226,000.  Sodium 141, potassium 4.5, bicarbonate 27, serum creatinine 0.63. The patient remained clinically stable through the hospitalization.  She was stable on RA without any increase WOB and tolerated her diet without any n/v/d.   PT recommended SNF.  Hospitalization was prolonged due to financial/social circumstance surrounding SNF search for the patient.    Assessment and Plan: Failure to thrive/generalized weakness -Secondary to COVID-19 infection -Check B12--781 -TSH-3.449 -Folic acid--12.3 -PT evaluation>>SNF -UA negative for pyuria -liberalized diet   COVID-19 infection -Stable on room air -10/4 CTA chest--Neg PE, no consolidation or GGO -tolerating diet   Stage III sacral cubitus ulcer -Does not appear infected on examination -Wound care consult appreciated-- to absorb drainage and promote healing: 1. Apply Aquacel [place] # [phone]) to left buttock wound Q day, using swb to fill, then cover with foam dresing.    Hypokalemia -Repleted -Check magnesium--2.0   Chronic HFpEF -Clinically compensated -Continue furosemide 20 mg daily -12/08/2022 echo EF 50%, grade 1 DD, trivial MR   Recurrent lower extremity DVT -Continue apixaban   Venous stasis dermatitis -Consult wound care for Unna boot dressings ---Change dressings to bilat legs Q M/W/F as follows: Apply Aquacel [place] # [phone]) to wounds on legs and cover with ABD pads and kerlex in a spiral fashion, beginning just behind toes to below knees, then cover with ace wrap in the same manner.  Condition at discharge: stable  The results of significant diagnostics from this hospitalization (including imaging, microbiology, ancillary and laboratory) are listed below for reference.   Imaging Studies: CT Angio Chest Pulmonary Embolism (PE) W or WO Contrast  Result Date: 12/25/2022 CLINICAL DATA:  Positive D-dimer.  COVID-19. EXAM: CT ANGIOGRAPHY CHEST WITH CONTRAST TECHNIQUE: Multidetector CT imaging of the chest was performed using the standard protocol during bolus administration of intravenous contrast. Multiplanar CT image reconstructions and MIPs were obtained to evaluate the vascular anatomy. RADIATION DOSE REDUCTION: This exam was performed according to the departmental dose-optimization program which includes automated exposure control, adjustment of the mA and/or kV according to patient size and/or use of iterative reconstruction technique. CONTRAST:  75mL OMNIPAQUE  IOHEXOL 350 MG/ML SOLN COMPARISON:  12/24/2022 chest radiograph. No prior CT. FINDINGS: Cardiovascular: The quality of this exam for evaluation of pulmonary embolism is good. No evidence of pulmonary embolism. Aortic atherosclerosis. Tortuous thoracic aorta. Mild cardiomegaly, without pericardial effusion. Left main and LAD coronary artery calcification. Mediastinum/Nodes: No mediastinal or hilar adenopathy. Lungs/Pleura: Trace bilateral pleural fluid. Left lower lobe hypoattenuation laterally is likely due to prior aspiration of contrast including on 51/6. Subsegmental atelectasis in both lower lobes. Upper Abdomen: High right hepatic lobe 1.4 cm cyst. Normal imaged portions of the spleen, stomach, pancreas, adrenal glands, kidneys. Cholecystectomy. Musculoskeletal: Accentuation of expected thoracic kyphosis. Mild superior endplate compression deformities at T5 and T6 without ventral canal encroachment. Review of the MIP images confirms the above findings. IMPRESSION: 1.  No evidence of pulmonary embolism. 2. No evidence of COVID-19 pneumonia. 3. Incidental findings, including: Coronary artery atherosclerosis. Aortic Atherosclerosis (ICD10-I70.0). Trace bilateral pleural fluid. Mild thoracic compression deformities. Electronically Signed   By: Jeronimo Greaves M.D.   On: 12/25/2022 16:19   DG Chest Portable 1 View  Result Date: 12/24/2022 CLINICAL DATA:  Cough EXAM: PORTABLE CHEST 1 VIEW COMPARISON:  12/09/2022 FINDINGS: The heart size and mediastinal contours are within normal limits. Both lungs are clear. The visualized skeletal structures are unremarkable. IMPRESSION: No active disease. Electronically Signed   By: Charlett Nose M.D.   On: 12/24/2022 17:40   ECHOCARDIOGRAM COMPLETE  Result Date: 12/08/2022    ECHOCARDIOGRAM REPORT   Patient Name:   WELDA AZZARELLO Paszkiewicz Date of Exam: 12/08/2022 Medical Rec #:  [id number]    Height:       66.0 in Accession #:    [id number]   Weight:       144.8 lb Date of Birth:  09/20/1932      BSA:          1.744 m Patient Age:    89 years     BP:           145/94 mmHg Patient Gender: F            HR:           93 bpm. [facility] Procedure: 2D Echo, Cardiac Doppler and Color Doppler Indications:    CHF  History:        Patient has prior history of Echocardiogram examinations, most                 recent 09/22/2021. CHF, Signs/Symptoms:Edema; Risk                 Factors:Hypertension and Dyslipidemia.  Sonographer:    Mikki Harbor Referring Phys: [id number] ASIA B ZIERLE-GHOSH  Sonographer Comments: Technically difficult study due to poor echo windows and suboptimal subcostal window.  Condition at discharge: stable  The results of significant diagnostics from this hospitalization (including imaging, microbiology, ancillary and laboratory) are listed below for reference.   Imaging Studies: CT Angio Chest Pulmonary Embolism (PE) W or WO Contrast  Result Date: 12/25/2022 CLINICAL DATA:  Positive D-dimer.  COVID-19. EXAM: CT ANGIOGRAPHY CHEST WITH CONTRAST TECHNIQUE: Multidetector CT imaging of the chest was performed using the standard protocol during bolus administration of intravenous contrast. Multiplanar CT image reconstructions and MIPs were obtained to evaluate the vascular anatomy. RADIATION DOSE REDUCTION: This exam was performed according to the departmental dose-optimization program which includes automated exposure control, adjustment of the mA and/or kV according to patient size and/or use of iterative reconstruction technique. CONTRAST:  75mL OMNIPAQUE  IOHEXOL 350 MG/ML SOLN COMPARISON:  12/24/2022 chest radiograph. No prior CT. FINDINGS: Cardiovascular: The quality of this exam for evaluation of pulmonary embolism is good. No evidence of pulmonary embolism. Aortic atherosclerosis. Tortuous thoracic aorta. Mild cardiomegaly, without pericardial effusion. Left main and LAD coronary artery calcification. Mediastinum/Nodes: No mediastinal or hilar adenopathy. Lungs/Pleura: Trace bilateral pleural fluid. Left lower lobe hypoattenuation laterally is likely due to prior aspiration of contrast including on 51/6. Subsegmental atelectasis in both lower lobes. Upper Abdomen: High right hepatic lobe 1.4 cm cyst. Normal imaged portions of the spleen, stomach, pancreas, adrenal glands, kidneys. Cholecystectomy. Musculoskeletal: Accentuation of expected thoracic kyphosis. Mild superior endplate compression deformities at T5 and T6 without ventral canal encroachment. Review of the MIP images confirms the above findings. IMPRESSION: 1.  No evidence of pulmonary embolism. 2. No evidence of COVID-19 pneumonia. 3. Incidental findings, including: Coronary artery atherosclerosis. Aortic Atherosclerosis (ICD10-I70.0). Trace bilateral pleural fluid. Mild thoracic compression deformities. Electronically Signed   By: Jeronimo Greaves M.D.   On: 12/25/2022 16:19   DG Chest Portable 1 View  Result Date: 12/24/2022 CLINICAL DATA:  Cough EXAM: PORTABLE CHEST 1 VIEW COMPARISON:  12/09/2022 FINDINGS: The heart size and mediastinal contours are within normal limits. Both lungs are clear. The visualized skeletal structures are unremarkable. IMPRESSION: No active disease. Electronically Signed   By: Charlett Nose M.D.   On: 12/24/2022 17:40   ECHOCARDIOGRAM COMPLETE  Result Date: 12/08/2022    ECHOCARDIOGRAM REPORT   Patient Name:   WELDA AZZARELLO Paszkiewicz Date of Exam: 12/08/2022 Medical Rec #:  [id number]    Height:       66.0 in Accession #:    [id number]   Weight:       144.8 lb Date of Birth:  09/20/1932      BSA:          1.744 m Patient Age:    89 years     BP:           145/94 mmHg Patient Gender: F            HR:           93 bpm. [facility] Procedure: 2D Echo, Cardiac Doppler and Color Doppler Indications:    CHF  History:        Patient has prior history of Echocardiogram examinations, most                 recent 09/22/2021. CHF, Signs/Symptoms:Edema; Risk                 Factors:Hypertension and Dyslipidemia.  Sonographer:    Mikki Harbor Referring Phys: [id number] ASIA B ZIERLE-GHOSH  Sonographer Comments: Technically difficult study due to poor echo windows and suboptimal subcostal window.  Physician Discharge Summary   Patient: [name] [name] MRN: [id number] DOB: [birth year]  Admit date:     12/24/2022  Discharge date: 12/29/22  Discharge Physician: Onalee Hua Trevar Boehringer   PCP: Kerri Perches, MD   Recommendations at discharge:   Please follow up with primary care provider within 1-2 weeks  Please repeat BMP and CBC in one week  Discharge Diagnoses: Principal Problem:   Failure to thrive in adult Active Problems:   Hypokalemia   COVID-19 virus infection   Sacral decubitus ulcer, stage III (HCC)   Essential hypertension   Lower extremity edema   Gait abnormality   History of DVT (deep vein thrombosis)   Chronic diastolic CHF (congestive heart failure) (HCC)  Resolved Problems:   * No resolved hospital problems. *  Hospital Course:  89 y.o. female with medical history significant for NASH, DVT, hypertension, HFpEF.  Patient brought to the ED by family with reports of progressive generalized weakness.  The patient was recently mid to the hospital from 12/07/2022 to 12/09/2022 due to acute on chronic HFpEF.  She was discharged home with furosemide 20 mg daily.  The patient lives alone, but family member check on her frequently.  Since discharge from the hospital, the patient visited the ED at [facility] on 12/09/22 because of generalized weakness and near syncope.  The patient was diagnosed with a UTI and sent home with Macrobid. Over the past 2 to 3 days, the patient has had worsening generalized weakness and cough with poor oral intake.  The patient took a home COVID test which was positive.  The patient denies any fevers, chills, chest pain, shortness breath, nausea, vomiting, diarrhea, abdominal pain. At baseline, the patient has poor functioning.  She is able to get out of bed with assistance and transfer to wheelchair.  However she has been weaker to the point where she is unable to get out of bed.  Because of worsening weakness, the patient presented for further evaluation and  treatment. In the ED, the patient was afebrile hemodynamically stable with oxygen saturation 100% room air.  WBC 5.3, hemoglobin 10.6, platelets 226,000.  Sodium 141, potassium 4.5, bicarbonate 27, serum creatinine 0.63. The patient remained clinically stable through the hospitalization.  She was stable on RA without any increase WOB and tolerated her diet without any n/v/d.   PT recommended SNF.  Hospitalization was prolonged due to financial/social circumstance surrounding SNF search for the patient.    Assessment and Plan: Failure to thrive/generalized weakness -Secondary to COVID-19 infection -Check B12--781 -TSH-3.449 -Folic acid--12.3 -PT evaluation>>SNF -UA negative for pyuria -liberalized diet   COVID-19 infection -Stable on room air -10/4 CTA chest--Neg PE, no consolidation or GGO -tolerating diet   Stage III sacral cubitus ulcer -Does not appear infected on examination -Wound care consult appreciated-- to absorb drainage and promote healing: 1. Apply Aquacel [place] # [phone]) to left buttock wound Q day, using swb to fill, then cover with foam dresing.    Hypokalemia -Repleted -Check magnesium--2.0   Chronic HFpEF -Clinically compensated -Continue furosemide 20 mg daily -12/08/2022 echo EF 50%, grade 1 DD, trivial MR   Recurrent lower extremity DVT -Continue apixaban   Venous stasis dermatitis -Consult wound care for Unna boot dressings ---Change dressings to bilat legs Q M/W/F as follows: Apply Aquacel [place] # [phone]) to wounds on legs and cover with ABD pads and kerlex in a spiral fashion, beginning just behind toes to below knees, then cover with ace wrap in the same manner.  Condition at discharge: stable  The results of significant diagnostics from this hospitalization (including imaging, microbiology, ancillary and laboratory) are listed below for reference.   Imaging Studies: CT Angio Chest Pulmonary Embolism (PE) W or WO Contrast  Result Date: 12/25/2022 CLINICAL DATA:  Positive D-dimer.  COVID-19. EXAM: CT ANGIOGRAPHY CHEST WITH CONTRAST TECHNIQUE: Multidetector CT imaging of the chest was performed using the standard protocol during bolus administration of intravenous contrast. Multiplanar CT image reconstructions and MIPs were obtained to evaluate the vascular anatomy. RADIATION DOSE REDUCTION: This exam was performed according to the departmental dose-optimization program which includes automated exposure control, adjustment of the mA and/or kV according to patient size and/or use of iterative reconstruction technique. CONTRAST:  75mL OMNIPAQUE  IOHEXOL 350 MG/ML SOLN COMPARISON:  12/24/2022 chest radiograph. No prior CT. FINDINGS: Cardiovascular: The quality of this exam for evaluation of pulmonary embolism is good. No evidence of pulmonary embolism. Aortic atherosclerosis. Tortuous thoracic aorta. Mild cardiomegaly, without pericardial effusion. Left main and LAD coronary artery calcification. Mediastinum/Nodes: No mediastinal or hilar adenopathy. Lungs/Pleura: Trace bilateral pleural fluid. Left lower lobe hypoattenuation laterally is likely due to prior aspiration of contrast including on 51/6. Subsegmental atelectasis in both lower lobes. Upper Abdomen: High right hepatic lobe 1.4 cm cyst. Normal imaged portions of the spleen, stomach, pancreas, adrenal glands, kidneys. Cholecystectomy. Musculoskeletal: Accentuation of expected thoracic kyphosis. Mild superior endplate compression deformities at T5 and T6 without ventral canal encroachment. Review of the MIP images confirms the above findings. IMPRESSION: 1.  No evidence of pulmonary embolism. 2. No evidence of COVID-19 pneumonia. 3. Incidental findings, including: Coronary artery atherosclerosis. Aortic Atherosclerosis (ICD10-I70.0). Trace bilateral pleural fluid. Mild thoracic compression deformities. Electronically Signed   By: Jeronimo Greaves M.D.   On: 12/25/2022 16:19   DG Chest Portable 1 View  Result Date: 12/24/2022 CLINICAL DATA:  Cough EXAM: PORTABLE CHEST 1 VIEW COMPARISON:  12/09/2022 FINDINGS: The heart size and mediastinal contours are within normal limits. Both lungs are clear. The visualized skeletal structures are unremarkable. IMPRESSION: No active disease. Electronically Signed   By: Charlett Nose M.D.   On: 12/24/2022 17:40   ECHOCARDIOGRAM COMPLETE  Result Date: 12/08/2022    ECHOCARDIOGRAM REPORT   Patient Name:   WELDA AZZARELLO Paszkiewicz Date of Exam: 12/08/2022 Medical Rec #:  [id number]    Height:       66.0 in Accession #:    [id number]   Weight:       144.8 lb Date of Birth:  09/20/1932      BSA:          1.744 m Patient Age:    89 years     BP:           145/94 mmHg Patient Gender: F            HR:           93 bpm. [facility] Procedure: 2D Echo, Cardiac Doppler and Color Doppler Indications:    CHF  History:        Patient has prior history of Echocardiogram examinations, most                 recent 09/22/2021. CHF, Signs/Symptoms:Edema; Risk                 Factors:Hypertension and Dyslipidemia.  Sonographer:    Mikki Harbor Referring Phys: [id number] ASIA B ZIERLE-GHOSH  Sonographer Comments: Technically difficult study due to poor echo windows and suboptimal subcostal window.  Condition at discharge: stable  The results of significant diagnostics from this hospitalization (including imaging, microbiology, ancillary and laboratory) are listed below for reference.   Imaging Studies: CT Angio Chest Pulmonary Embolism (PE) W or WO Contrast  Result Date: 12/25/2022 CLINICAL DATA:  Positive D-dimer.  COVID-19. EXAM: CT ANGIOGRAPHY CHEST WITH CONTRAST TECHNIQUE: Multidetector CT imaging of the chest was performed using the standard protocol during bolus administration of intravenous contrast. Multiplanar CT image reconstructions and MIPs were obtained to evaluate the vascular anatomy. RADIATION DOSE REDUCTION: This exam was performed according to the departmental dose-optimization program which includes automated exposure control, adjustment of the mA and/or kV according to patient size and/or use of iterative reconstruction technique. CONTRAST:  75mL OMNIPAQUE  IOHEXOL 350 MG/ML SOLN COMPARISON:  12/24/2022 chest radiograph. No prior CT. FINDINGS: Cardiovascular: The quality of this exam for evaluation of pulmonary embolism is good. No evidence of pulmonary embolism. Aortic atherosclerosis. Tortuous thoracic aorta. Mild cardiomegaly, without pericardial effusion. Left main and LAD coronary artery calcification. Mediastinum/Nodes: No mediastinal or hilar adenopathy. Lungs/Pleura: Trace bilateral pleural fluid. Left lower lobe hypoattenuation laterally is likely due to prior aspiration of contrast including on 51/6. Subsegmental atelectasis in both lower lobes. Upper Abdomen: High right hepatic lobe 1.4 cm cyst. Normal imaged portions of the spleen, stomach, pancreas, adrenal glands, kidneys. Cholecystectomy. Musculoskeletal: Accentuation of expected thoracic kyphosis. Mild superior endplate compression deformities at T5 and T6 without ventral canal encroachment. Review of the MIP images confirms the above findings. IMPRESSION: 1.  No evidence of pulmonary embolism. 2. No evidence of COVID-19 pneumonia. 3. Incidental findings, including: Coronary artery atherosclerosis. Aortic Atherosclerosis (ICD10-I70.0). Trace bilateral pleural fluid. Mild thoracic compression deformities. Electronically Signed   By: Jeronimo Greaves M.D.   On: 12/25/2022 16:19   DG Chest Portable 1 View  Result Date: 12/24/2022 CLINICAL DATA:  Cough EXAM: PORTABLE CHEST 1 VIEW COMPARISON:  12/09/2022 FINDINGS: The heart size and mediastinal contours are within normal limits. Both lungs are clear. The visualized skeletal structures are unremarkable. IMPRESSION: No active disease. Electronically Signed   By: Charlett Nose M.D.   On: 12/24/2022 17:40   ECHOCARDIOGRAM COMPLETE  Result Date: 12/08/2022    ECHOCARDIOGRAM REPORT   Patient Name:   WELDA AZZARELLO Paszkiewicz Date of Exam: 12/08/2022 Medical Rec #:  [id number]    Height:       66.0 in Accession #:    [id number]   Weight:       144.8 lb Date of Birth:  09/20/1932      BSA:          1.744 m Patient Age:    89 years     BP:           145/94 mmHg Patient Gender: F            HR:           93 bpm. [facility] Procedure: 2D Echo, Cardiac Doppler and Color Doppler Indications:    CHF  History:        Patient has prior history of Echocardiogram examinations, most                 recent 09/22/2021. CHF, Signs/Symptoms:Edema; Risk                 Factors:Hypertension and Dyslipidemia.  Sonographer:    Mikki Harbor Referring Phys: [id number] ASIA B ZIERLE-GHOSH  Sonographer Comments: Technically difficult study due to poor echo windows and suboptimal subcostal window.

## 2022-12-29 NOTE — Care Management Important Message (Signed)
Important Message  Patient Details  Name: [name] [name] MRN: [id number] Date of Birth: [birth year]   Important Message Given:  Yes - Medicare IM (spoke with son Trey Paula to review letter, no additional copy needed)     Corey Harold 12/29/2022, 10:22 AM

## 2022-12-30 ENCOUNTER — Encounter: Payer: Medicare HMO | Admitting: Family Medicine

## 2022-12-30 DIAGNOSIS — I5032 Chronic diastolic (congestive) heart failure: Secondary | ICD-10-CM | POA: Diagnosis not present

## 2022-12-30 DIAGNOSIS — L97822 Non-pressure chronic ulcer of other part of left lower leg with fat layer exposed: Secondary | ICD-10-CM | POA: Diagnosis not present

## 2022-12-30 DIAGNOSIS — L89153 Pressure ulcer of sacral region, stage 3: Secondary | ICD-10-CM | POA: Diagnosis not present

## 2022-12-30 DIAGNOSIS — R296 Repeated falls: Secondary | ICD-10-CM | POA: Diagnosis not present

## 2022-12-30 DIAGNOSIS — I87312 Chronic venous hypertension (idiopathic) with ulcer of left lower extremity: Secondary | ICD-10-CM | POA: Diagnosis not present

## 2023-01-04 DIAGNOSIS — I5032 Chronic diastolic (congestive) heart failure: Secondary | ICD-10-CM | POA: Diagnosis not present

## 2023-01-04 DIAGNOSIS — L97822 Non-pressure chronic ulcer of other part of left lower leg with fat layer exposed: Secondary | ICD-10-CM | POA: Diagnosis not present

## 2023-01-04 DIAGNOSIS — L89153 Pressure ulcer of sacral region, stage 3: Secondary | ICD-10-CM | POA: Diagnosis not present

## 2023-01-04 DIAGNOSIS — I87312 Chronic venous hypertension (idiopathic) with ulcer of left lower extremity: Secondary | ICD-10-CM | POA: Diagnosis not present

## 2023-01-04 DIAGNOSIS — R5381 Other malaise: Secondary | ICD-10-CM | POA: Diagnosis not present

## 2023-01-04 DIAGNOSIS — U071 COVID-19: Secondary | ICD-10-CM | POA: Diagnosis not present

## 2023-01-04 DIAGNOSIS — R296 Repeated falls: Secondary | ICD-10-CM | POA: Diagnosis not present

## 2023-01-06 DIAGNOSIS — R296 Repeated falls: Secondary | ICD-10-CM | POA: Diagnosis not present

## 2023-01-06 DIAGNOSIS — L89153 Pressure ulcer of sacral region, stage 3: Secondary | ICD-10-CM | POA: Diagnosis not present

## 2023-01-06 DIAGNOSIS — I5032 Chronic diastolic (congestive) heart failure: Secondary | ICD-10-CM | POA: Diagnosis not present

## 2023-01-11 DIAGNOSIS — Z79899 Other long term (current) drug therapy: Secondary | ICD-10-CM | POA: Diagnosis not present

## 2023-01-11 DIAGNOSIS — R635 Abnormal weight gain: Secondary | ICD-10-CM | POA: Diagnosis not present

## 2023-01-11 DIAGNOSIS — K7581 Nonalcoholic steatohepatitis (NASH): Secondary | ICD-10-CM | POA: Diagnosis not present

## 2023-01-11 DIAGNOSIS — R4182 Altered mental status, unspecified: Secondary | ICD-10-CM | POA: Diagnosis not present

## 2023-01-11 DIAGNOSIS — I509 Heart failure, unspecified: Secondary | ICD-10-CM | POA: Diagnosis not present

## 2023-01-11 DIAGNOSIS — M25551 Pain in right hip: Secondary | ICD-10-CM | POA: Diagnosis not present

## 2023-01-12 DIAGNOSIS — R82998 Other abnormal findings in urine: Secondary | ICD-10-CM | POA: Diagnosis not present

## 2023-01-12 DIAGNOSIS — I959 Hypotension, unspecified: Secondary | ICD-10-CM | POA: Diagnosis not present

## 2023-01-12 DIAGNOSIS — D696 Thrombocytopenia, unspecified: Secondary | ICD-10-CM | POA: Diagnosis not present

## 2023-01-12 DIAGNOSIS — R4182 Altered mental status, unspecified: Secondary | ICD-10-CM | POA: Diagnosis not present

## 2023-01-12 DIAGNOSIS — D72819 Decreased white blood cell count, unspecified: Secondary | ICD-10-CM | POA: Diagnosis not present

## 2023-01-12 DIAGNOSIS — D649 Anemia, unspecified: Secondary | ICD-10-CM | POA: Diagnosis not present

## 2023-01-13 DIAGNOSIS — I5032 Chronic diastolic (congestive) heart failure: Secondary | ICD-10-CM | POA: Diagnosis not present

## 2023-01-13 DIAGNOSIS — L89153 Pressure ulcer of sacral region, stage 3: Secondary | ICD-10-CM | POA: Diagnosis not present

## 2023-01-13 DIAGNOSIS — R296 Repeated falls: Secondary | ICD-10-CM | POA: Diagnosis not present

## 2023-01-14 ENCOUNTER — Inpatient Hospital Stay: Payer: Medicare HMO | Attending: Family Medicine

## 2023-01-18 DIAGNOSIS — Z86711 Personal history of pulmonary embolism: Secondary | ICD-10-CM | POA: Diagnosis not present

## 2023-01-18 DIAGNOSIS — D649 Anemia, unspecified: Secondary | ICD-10-CM | POA: Diagnosis not present

## 2023-01-18 DIAGNOSIS — K7689 Other specified diseases of liver: Secondary | ICD-10-CM | POA: Diagnosis not present

## 2023-01-18 DIAGNOSIS — Z886 Allergy status to analgesic agent status: Secondary | ICD-10-CM | POA: Diagnosis not present

## 2023-01-18 DIAGNOSIS — K5730 Diverticulosis of large intestine without perforation or abscess without bleeding: Secondary | ICD-10-CM | POA: Diagnosis not present

## 2023-01-18 DIAGNOSIS — Z881 Allergy status to other antibiotic agents status: Secondary | ICD-10-CM | POA: Diagnosis not present

## 2023-01-18 DIAGNOSIS — G9340 Encephalopathy, unspecified: Secondary | ICD-10-CM | POA: Diagnosis not present

## 2023-01-18 DIAGNOSIS — Z880 Allergy status to penicillin: Secondary | ICD-10-CM | POA: Diagnosis not present

## 2023-01-18 DIAGNOSIS — Z888 Allergy status to other drugs, medicaments and biological substances status: Secondary | ICD-10-CM | POA: Diagnosis not present

## 2023-01-18 DIAGNOSIS — R4182 Altered mental status, unspecified: Secondary | ICD-10-CM | POA: Diagnosis not present

## 2023-01-18 DIAGNOSIS — I509 Heart failure, unspecified: Secondary | ICD-10-CM | POA: Diagnosis not present

## 2023-01-18 DIAGNOSIS — I700 Atherosclerosis of aorta: Secondary | ICD-10-CM | POA: Diagnosis not present

## 2023-01-18 DIAGNOSIS — R0989 Other specified symptoms and signs involving the circulatory and respiratory systems: Secondary | ICD-10-CM | POA: Diagnosis not present

## 2023-01-18 DIAGNOSIS — N390 Urinary tract infection, site not specified: Secondary | ICD-10-CM | POA: Diagnosis not present

## 2023-01-18 DIAGNOSIS — D61818 Other pancytopenia: Secondary | ICD-10-CM | POA: Diagnosis not present

## 2023-01-19 DIAGNOSIS — I87323 Chronic venous hypertension (idiopathic) with inflammation of bilateral lower extremity: Secondary | ICD-10-CM | POA: Diagnosis not present

## 2023-01-19 DIAGNOSIS — L89153 Pressure ulcer of sacral region, stage 3: Secondary | ICD-10-CM | POA: Diagnosis not present

## 2023-01-19 DIAGNOSIS — I5022 Chronic systolic (congestive) heart failure: Secondary | ICD-10-CM | POA: Diagnosis not present

## 2023-01-19 DIAGNOSIS — D509 Iron deficiency anemia, unspecified: Secondary | ICD-10-CM | POA: Diagnosis not present

## 2023-01-19 DIAGNOSIS — D61818 Other pancytopenia: Secondary | ICD-10-CM | POA: Diagnosis not present

## 2023-01-19 DIAGNOSIS — Z86711 Personal history of pulmonary embolism: Secondary | ICD-10-CM | POA: Diagnosis not present

## 2023-01-19 DIAGNOSIS — E876 Hypokalemia: Secondary | ICD-10-CM | POA: Diagnosis not present

## 2023-01-19 DIAGNOSIS — R442 Other hallucinations: Secondary | ICD-10-CM | POA: Diagnosis not present

## 2023-01-19 DIAGNOSIS — F0390 Unspecified dementia without behavioral disturbance: Secondary | ICD-10-CM | POA: Diagnosis not present

## 2023-01-19 DIAGNOSIS — U071 COVID-19: Secondary | ICD-10-CM | POA: Diagnosis not present

## 2023-01-19 DIAGNOSIS — Z886 Allergy status to analgesic agent status: Secondary | ICD-10-CM | POA: Diagnosis not present

## 2023-01-19 DIAGNOSIS — I700 Atherosclerosis of aorta: Secondary | ICD-10-CM | POA: Diagnosis not present

## 2023-01-19 DIAGNOSIS — K921 Melena: Secondary | ICD-10-CM | POA: Diagnosis not present

## 2023-01-19 DIAGNOSIS — E038 Other specified hypothyroidism: Secondary | ICD-10-CM | POA: Diagnosis not present

## 2023-01-19 DIAGNOSIS — D649 Anemia, unspecified: Secondary | ICD-10-CM | POA: Diagnosis not present

## 2023-01-19 DIAGNOSIS — K5730 Diverticulosis of large intestine without perforation or abscess without bleeding: Secondary | ICD-10-CM | POA: Diagnosis not present

## 2023-01-19 DIAGNOSIS — I469 Cardiac arrest, cause unspecified: Secondary | ICD-10-CM | POA: Diagnosis not present

## 2023-01-19 DIAGNOSIS — C946 Myelodysplastic disease, not classified: Secondary | ICD-10-CM | POA: Diagnosis not present

## 2023-01-19 DIAGNOSIS — E871 Hypo-osmolality and hyponatremia: Secondary | ICD-10-CM | POA: Diagnosis not present

## 2023-01-19 DIAGNOSIS — I82409 Acute embolism and thrombosis of unspecified deep veins of unspecified lower extremity: Secondary | ICD-10-CM | POA: Diagnosis not present

## 2023-01-19 DIAGNOSIS — Z86718 Personal history of other venous thrombosis and embolism: Secondary | ICD-10-CM | POA: Diagnosis not present

## 2023-01-19 DIAGNOSIS — G9340 Encephalopathy, unspecified: Secondary | ICD-10-CM | POA: Diagnosis not present

## 2023-01-19 DIAGNOSIS — R0989 Other specified symptoms and signs involving the circulatory and respiratory systems: Secondary | ICD-10-CM | POA: Diagnosis not present

## 2023-01-19 DIAGNOSIS — K7689 Other specified diseases of liver: Secondary | ICD-10-CM | POA: Diagnosis not present

## 2023-01-19 DIAGNOSIS — I5032 Chronic diastolic (congestive) heart failure: Secondary | ICD-10-CM | POA: Diagnosis not present

## 2023-01-19 DIAGNOSIS — D469 Myelodysplastic syndrome, unspecified: Secondary | ICD-10-CM | POA: Diagnosis not present

## 2023-01-19 DIAGNOSIS — I110 Hypertensive heart disease with heart failure: Secondary | ICD-10-CM | POA: Diagnosis not present

## 2023-01-19 DIAGNOSIS — K2970 Gastritis, unspecified, without bleeding: Secondary | ICD-10-CM | POA: Diagnosis not present

## 2023-01-19 DIAGNOSIS — M069 Rheumatoid arthritis, unspecified: Secondary | ICD-10-CM | POA: Diagnosis not present

## 2023-01-19 DIAGNOSIS — Z881 Allergy status to other antibiotic agents status: Secondary | ICD-10-CM | POA: Diagnosis not present

## 2023-01-19 DIAGNOSIS — D62 Acute posthemorrhagic anemia: Secondary | ICD-10-CM | POA: Diagnosis not present

## 2023-01-19 DIAGNOSIS — N3000 Acute cystitis without hematuria: Secondary | ICD-10-CM | POA: Diagnosis not present

## 2023-01-19 DIAGNOSIS — I82509 Chronic embolism and thrombosis of unspecified deep veins of unspecified lower extremity: Secondary | ICD-10-CM | POA: Diagnosis not present

## 2023-01-19 DIAGNOSIS — G9341 Metabolic encephalopathy: Secondary | ICD-10-CM | POA: Diagnosis not present

## 2023-01-19 DIAGNOSIS — R4781 Slurred speech: Secondary | ICD-10-CM | POA: Diagnosis not present

## 2023-01-19 DIAGNOSIS — D479 Neoplasm of uncertain behavior of lymphoid, hematopoietic and related tissue, unspecified: Secondary | ICD-10-CM | POA: Diagnosis not present

## 2023-01-19 DIAGNOSIS — Z888 Allergy status to other drugs, medicaments and biological substances status: Secondary | ICD-10-CM | POA: Diagnosis not present

## 2023-01-19 DIAGNOSIS — Z743 Need for continuous supervision: Secondary | ICD-10-CM | POA: Diagnosis not present

## 2023-01-19 DIAGNOSIS — Z8659 Personal history of other mental and behavioral disorders: Secondary | ICD-10-CM | POA: Diagnosis not present

## 2023-01-19 DIAGNOSIS — R4189 Other symptoms and signs involving cognitive functions and awareness: Secondary | ICD-10-CM | POA: Diagnosis not present

## 2023-01-19 DIAGNOSIS — Z880 Allergy status to penicillin: Secondary | ICD-10-CM | POA: Diagnosis not present

## 2023-01-19 DIAGNOSIS — E0590 Thyrotoxicosis, unspecified without thyrotoxic crisis or storm: Secondary | ICD-10-CM | POA: Diagnosis not present

## 2023-01-19 DIAGNOSIS — I509 Heart failure, unspecified: Secondary | ICD-10-CM | POA: Diagnosis not present

## 2023-01-19 DIAGNOSIS — E039 Hypothyroidism, unspecified: Secondary | ICD-10-CM | POA: Diagnosis not present

## 2023-01-19 DIAGNOSIS — R4182 Altered mental status, unspecified: Secondary | ICD-10-CM | POA: Diagnosis not present

## 2023-01-19 DIAGNOSIS — E43 Unspecified severe protein-calorie malnutrition: Secondary | ICD-10-CM | POA: Diagnosis not present

## 2023-01-19 DIAGNOSIS — E440 Moderate protein-calorie malnutrition: Secondary | ICD-10-CM | POA: Diagnosis not present

## 2023-01-19 DIAGNOSIS — T68XXXD Hypothermia, subsequent encounter: Secondary | ICD-10-CM | POA: Diagnosis not present

## 2023-01-19 DIAGNOSIS — R58 Hemorrhage, not elsewhere classified: Secondary | ICD-10-CM | POA: Diagnosis not present

## 2023-01-19 DIAGNOSIS — K31811 Angiodysplasia of stomach and duodenum with bleeding: Secondary | ICD-10-CM | POA: Diagnosis not present

## 2023-01-19 DIAGNOSIS — R52 Pain, unspecified: Secondary | ICD-10-CM | POA: Diagnosis not present

## 2023-01-19 DIAGNOSIS — E870 Hyperosmolality and hypernatremia: Secondary | ICD-10-CM | POA: Diagnosis not present

## 2023-01-19 DIAGNOSIS — Z8669 Personal history of other diseases of the nervous system and sense organs: Secondary | ICD-10-CM | POA: Diagnosis not present

## 2023-01-19 DIAGNOSIS — K449 Diaphragmatic hernia without obstruction or gangrene: Secondary | ICD-10-CM | POA: Diagnosis not present

## 2023-01-21 ENCOUNTER — Ambulatory Visit: Payer: Medicare HMO | Admitting: Oncology

## 2023-01-22 DIAGNOSIS — D479 Neoplasm of uncertain behavior of lymphoid, hematopoietic and related tissue, unspecified: Secondary | ICD-10-CM | POA: Diagnosis not present

## 2023-01-22 DIAGNOSIS — D61818 Other pancytopenia: Secondary | ICD-10-CM | POA: Diagnosis not present

## 2023-01-28 ENCOUNTER — Inpatient Hospital Stay: Payer: Medicare HMO | Admitting: Oncology

## 2023-02-01 DIAGNOSIS — R000 Tachycardia, unspecified: Secondary | ICD-10-CM | POA: Diagnosis not present

## 2023-02-01 DIAGNOSIS — F3189 Other bipolar disorder: Secondary | ICD-10-CM | POA: Diagnosis not present

## 2023-02-01 DIAGNOSIS — J9601 Acute respiratory failure with hypoxia: Secondary | ICD-10-CM | POA: Diagnosis not present

## 2023-02-01 DIAGNOSIS — R6521 Severe sepsis with septic shock: Secondary | ICD-10-CM | POA: Diagnosis not present

## 2023-02-01 DIAGNOSIS — R4182 Altered mental status, unspecified: Secondary | ICD-10-CM | POA: Diagnosis not present

## 2023-02-01 DIAGNOSIS — R0902 Hypoxemia: Secondary | ICD-10-CM | POA: Diagnosis not present

## 2023-02-01 DIAGNOSIS — R0689 Other abnormalities of breathing: Secondary | ICD-10-CM | POA: Diagnosis not present

## 2023-02-01 DIAGNOSIS — M069 Rheumatoid arthritis, unspecified: Secondary | ICD-10-CM | POA: Diagnosis not present

## 2023-02-01 DIAGNOSIS — T68XXXD Hypothermia, subsequent encounter: Secondary | ICD-10-CM | POA: Diagnosis not present

## 2023-02-01 DIAGNOSIS — R296 Repeated falls: Secondary | ICD-10-CM | POA: Diagnosis not present

## 2023-02-01 DIAGNOSIS — C946 Myelodysplastic disease, not classified: Secondary | ICD-10-CM | POA: Diagnosis not present

## 2023-02-01 DIAGNOSIS — I87312 Chronic venous hypertension (idiopathic) with ulcer of left lower extremity: Secondary | ICD-10-CM | POA: Diagnosis not present

## 2023-02-01 DIAGNOSIS — F411 Generalized anxiety disorder: Secondary | ICD-10-CM | POA: Diagnosis not present

## 2023-02-01 DIAGNOSIS — G9341 Metabolic encephalopathy: Secondary | ICD-10-CM | POA: Diagnosis not present

## 2023-02-01 DIAGNOSIS — I5022 Chronic systolic (congestive) heart failure: Secondary | ICD-10-CM | POA: Diagnosis not present

## 2023-02-01 DIAGNOSIS — D61818 Other pancytopenia: Secondary | ICD-10-CM | POA: Diagnosis not present

## 2023-02-01 DIAGNOSIS — J189 Pneumonia, unspecified organism: Secondary | ICD-10-CM | POA: Diagnosis not present

## 2023-02-01 DIAGNOSIS — R570 Cardiogenic shock: Secondary | ICD-10-CM | POA: Diagnosis not present

## 2023-02-01 DIAGNOSIS — Z743 Need for continuous supervision: Secondary | ICD-10-CM | POA: Diagnosis not present

## 2023-02-01 DIAGNOSIS — J90 Pleural effusion, not elsewhere classified: Secondary | ICD-10-CM | POA: Diagnosis not present

## 2023-02-01 DIAGNOSIS — E43 Unspecified severe protein-calorie malnutrition: Secondary | ICD-10-CM | POA: Diagnosis not present

## 2023-02-01 DIAGNOSIS — T68XXXA Hypothermia, initial encounter: Secondary | ICD-10-CM | POA: Diagnosis not present

## 2023-02-01 DIAGNOSIS — U071 COVID-19: Secondary | ICD-10-CM | POA: Diagnosis not present

## 2023-02-01 DIAGNOSIS — F0390 Unspecified dementia without behavioral disturbance: Secondary | ICD-10-CM | POA: Diagnosis not present

## 2023-02-01 DIAGNOSIS — L89153 Pressure ulcer of sacral region, stage 3: Secondary | ICD-10-CM | POA: Diagnosis not present

## 2023-02-01 DIAGNOSIS — I5021 Acute systolic (congestive) heart failure: Secondary | ICD-10-CM | POA: Diagnosis not present

## 2023-02-01 DIAGNOSIS — R0602 Shortness of breath: Secondary | ICD-10-CM | POA: Diagnosis not present

## 2023-02-01 DIAGNOSIS — R52 Pain, unspecified: Secondary | ICD-10-CM | POA: Diagnosis not present

## 2023-02-01 DIAGNOSIS — G9340 Encephalopathy, unspecified: Secondary | ICD-10-CM | POA: Diagnosis not present

## 2023-02-01 DIAGNOSIS — I5032 Chronic diastolic (congestive) heart failure: Secondary | ICD-10-CM | POA: Diagnosis not present

## 2023-02-01 DIAGNOSIS — E870 Hyperosmolality and hypernatremia: Secondary | ICD-10-CM | POA: Diagnosis not present

## 2023-02-01 DIAGNOSIS — R5381 Other malaise: Secondary | ICD-10-CM | POA: Diagnosis not present

## 2023-02-01 DIAGNOSIS — D62 Acute posthemorrhagic anemia: Secondary | ICD-10-CM | POA: Diagnosis not present

## 2023-02-01 DIAGNOSIS — A419 Sepsis, unspecified organism: Secondary | ICD-10-CM | POA: Diagnosis not present

## 2023-02-01 DIAGNOSIS — R042 Hemoptysis: Secondary | ICD-10-CM | POA: Diagnosis not present

## 2023-02-01 DIAGNOSIS — I110 Hypertensive heart disease with heart failure: Secondary | ICD-10-CM | POA: Diagnosis not present

## 2023-02-01 DIAGNOSIS — L97822 Non-pressure chronic ulcer of other part of left lower leg with fat layer exposed: Secondary | ICD-10-CM | POA: Diagnosis not present

## 2023-02-01 DIAGNOSIS — I10 Essential (primary) hypertension: Secondary | ICD-10-CM | POA: Diagnosis not present

## 2023-02-01 DIAGNOSIS — N3000 Acute cystitis without hematuria: Secondary | ICD-10-CM | POA: Diagnosis not present

## 2023-02-01 DIAGNOSIS — I5023 Acute on chronic systolic (congestive) heart failure: Secondary | ICD-10-CM | POA: Diagnosis not present

## 2023-02-01 NOTE — Nursing Note (Signed)
 Pt discharged to [facility].   02/01/23 1436  Final Assessment  Patient's Post Acute Contact Information Chyrl Goodloe/ [phone]  Post Acute Facility needed at discharge? Yes  Post Acute Facility SNF (Skilled Nursing Facility)  Facility (Name/Phone #) [facility]  Home Care/ Home Medical Equipment needed at discharge? No  Outpatient/Community Referrals needed for discharge? No  Currently receiving outpatient dialysis? No  Discharge Disposition Skilled Nursing Facility  Transportation Anticipated ambulance  Quality data for continuing care services shared with patient and/or representative? Yes  Patient and/or family were provided with choice of facilities / services that are available and appropriate to meet post hospital care needs? Yes   List choices in order highest to lowest preferred, if applicable.  [facility]  Discharge Packet Contents History and Physical;Demographics and Contact Information;Discharge Summary (including Orders);Recent PT/OT/ST notes  IMM Delivery  Follow Up Important Message (IM) letter given to patient and/or family? Yes  Final Assessment Complete  Final Assessment Complete Yes

## 2023-02-02 DIAGNOSIS — G9340 Encephalopathy, unspecified: Secondary | ICD-10-CM | POA: Diagnosis not present

## 2023-02-02 DIAGNOSIS — D61818 Other pancytopenia: Secondary | ICD-10-CM | POA: Diagnosis not present

## 2023-02-02 DIAGNOSIS — N3000 Acute cystitis without hematuria: Secondary | ICD-10-CM | POA: Diagnosis not present

## 2023-02-03 DIAGNOSIS — F3189 Other bipolar disorder: Secondary | ICD-10-CM | POA: Diagnosis not present

## 2023-02-03 DIAGNOSIS — F411 Generalized anxiety disorder: Secondary | ICD-10-CM | POA: Diagnosis not present

## 2023-02-08 DIAGNOSIS — G9341 Metabolic encephalopathy: Secondary | ICD-10-CM | POA: Diagnosis not present

## 2023-02-08 DIAGNOSIS — R5381 Other malaise: Secondary | ICD-10-CM | POA: Diagnosis not present

## 2023-02-10 DIAGNOSIS — I87312 Chronic venous hypertension (idiopathic) with ulcer of left lower extremity: Secondary | ICD-10-CM | POA: Diagnosis not present

## 2023-02-10 DIAGNOSIS — I5032 Chronic diastolic (congestive) heart failure: Secondary | ICD-10-CM | POA: Diagnosis not present

## 2023-02-10 DIAGNOSIS — L89153 Pressure ulcer of sacral region, stage 3: Secondary | ICD-10-CM | POA: Diagnosis not present

## 2023-02-10 DIAGNOSIS — L97822 Non-pressure chronic ulcer of other part of left lower leg with fat layer exposed: Secondary | ICD-10-CM | POA: Diagnosis not present

## 2023-02-10 DIAGNOSIS — R296 Repeated falls: Secondary | ICD-10-CM | POA: Diagnosis not present

## 2023-02-16 DIAGNOSIS — R5381 Other malaise: Secondary | ICD-10-CM | POA: Diagnosis not present

## 2023-02-16 DIAGNOSIS — G9341 Metabolic encephalopathy: Secondary | ICD-10-CM | POA: Diagnosis not present

## 2023-02-16 DIAGNOSIS — R4182 Altered mental status, unspecified: Secondary | ICD-10-CM | POA: Diagnosis not present

## 2023-02-16 DIAGNOSIS — G9340 Encephalopathy, unspecified: Secondary | ICD-10-CM | POA: Diagnosis not present

## 2023-02-17 DIAGNOSIS — T68XXXA Hypothermia, initial encounter: Secondary | ICD-10-CM | POA: Diagnosis not present

## 2023-02-17 DIAGNOSIS — S299XXA Unspecified injury of thorax, initial encounter: Secondary | ICD-10-CM | POA: Diagnosis not present

## 2023-02-17 DIAGNOSIS — R000 Tachycardia, unspecified: Secondary | ICD-10-CM | POA: Diagnosis not present

## 2023-02-17 DIAGNOSIS — A419 Sepsis, unspecified organism: Secondary | ICD-10-CM | POA: Diagnosis not present

## 2023-02-17 DIAGNOSIS — R4182 Altered mental status, unspecified: Secondary | ICD-10-CM | POA: Diagnosis not present

## 2023-02-17 DIAGNOSIS — J189 Pneumonia, unspecified organism: Secondary | ICD-10-CM | POA: Diagnosis not present

## 2023-02-17 DIAGNOSIS — R570 Cardiogenic shock: Secondary | ICD-10-CM | POA: Diagnosis not present

## 2023-02-17 DIAGNOSIS — R410 Disorientation, unspecified: Secondary | ICD-10-CM | POA: Diagnosis not present

## 2023-02-17 DIAGNOSIS — J9601 Acute respiratory failure with hypoxia: Secondary | ICD-10-CM | POA: Diagnosis not present

## 2023-02-17 DIAGNOSIS — Z4682 Encounter for fitting and adjustment of non-vascular catheter: Secondary | ICD-10-CM | POA: Diagnosis not present

## 2023-02-17 DIAGNOSIS — G9341 Metabolic encephalopathy: Secondary | ICD-10-CM | POA: Diagnosis not present

## 2023-02-17 DIAGNOSIS — Z7401 Bed confinement status: Secondary | ICD-10-CM | POA: Diagnosis not present

## 2023-02-17 DIAGNOSIS — I10 Essential (primary) hypertension: Secondary | ICD-10-CM | POA: Diagnosis not present

## 2023-02-17 DIAGNOSIS — R0602 Shortness of breath: Secondary | ICD-10-CM | POA: Diagnosis not present

## 2023-02-17 DIAGNOSIS — R918 Other nonspecific abnormal finding of lung field: Secondary | ICD-10-CM | POA: Diagnosis not present

## 2023-02-17 DIAGNOSIS — R531 Weakness: Secondary | ICD-10-CM | POA: Diagnosis not present

## 2023-02-17 DIAGNOSIS — J90 Pleural effusion, not elsewhere classified: Secondary | ICD-10-CM | POA: Diagnosis not present

## 2023-02-17 DIAGNOSIS — I5023 Acute on chronic systolic (congestive) heart failure: Secondary | ICD-10-CM | POA: Diagnosis not present

## 2023-02-17 DIAGNOSIS — E43 Unspecified severe protein-calorie malnutrition: Secondary | ICD-10-CM | POA: Diagnosis not present

## 2023-02-17 DIAGNOSIS — R0689 Other abnormalities of breathing: Secondary | ICD-10-CM | POA: Diagnosis not present

## 2023-02-17 DIAGNOSIS — R0902 Hypoxemia: Secondary | ICD-10-CM | POA: Diagnosis not present

## 2023-02-17 DIAGNOSIS — I5021 Acute systolic (congestive) heart failure: Secondary | ICD-10-CM | POA: Diagnosis not present

## 2023-02-17 DIAGNOSIS — D62 Acute posthemorrhagic anemia: Secondary | ICD-10-CM | POA: Diagnosis not present

## 2023-02-17 DIAGNOSIS — R6521 Severe sepsis with septic shock: Secondary | ICD-10-CM | POA: Diagnosis not present

## 2023-02-17 DIAGNOSIS — R042 Hemoptysis: Secondary | ICD-10-CM | POA: Diagnosis not present

## 2023-02-18 DIAGNOSIS — Z4682 Encounter for fitting and adjustment of non-vascular catheter: Secondary | ICD-10-CM | POA: Diagnosis not present

## 2023-02-18 DIAGNOSIS — R918 Other nonspecific abnormal finding of lung field: Secondary | ICD-10-CM | POA: Diagnosis not present

## 2023-02-18 DIAGNOSIS — J189 Pneumonia, unspecified organism: Secondary | ICD-10-CM | POA: Diagnosis not present

## 2023-02-20 DIAGNOSIS — R918 Other nonspecific abnormal finding of lung field: Secondary | ICD-10-CM | POA: Diagnosis not present

## 2023-02-21 DIAGNOSIS — Z4682 Encounter for fitting and adjustment of non-vascular catheter: Secondary | ICD-10-CM | POA: Diagnosis not present

## 2023-02-21 DIAGNOSIS — R6521 Severe sepsis with septic shock: Secondary | ICD-10-CM | POA: Diagnosis not present

## 2023-02-21 DIAGNOSIS — J9601 Acute respiratory failure with hypoxia: Secondary | ICD-10-CM | POA: Diagnosis not present

## 2023-02-21 DIAGNOSIS — T68XXXA Hypothermia, initial encounter: Secondary | ICD-10-CM | POA: Diagnosis not present

## 2023-02-21 DIAGNOSIS — J189 Pneumonia, unspecified organism: Secondary | ICD-10-CM | POA: Diagnosis not present

## 2023-02-21 DIAGNOSIS — I5021 Acute systolic (congestive) heart failure: Secondary | ICD-10-CM | POA: Diagnosis not present

## 2023-02-21 DIAGNOSIS — S299XXA Unspecified injury of thorax, initial encounter: Secondary | ICD-10-CM | POA: Diagnosis not present

## 2023-02-22 DIAGNOSIS — A419 Sepsis, unspecified organism: Secondary | ICD-10-CM | POA: Diagnosis not present

## 2023-02-23 DIAGNOSIS — A419 Sepsis, unspecified organism: Secondary | ICD-10-CM | POA: Diagnosis not present

## 2023-02-24 DIAGNOSIS — A419 Sepsis, unspecified organism: Secondary | ICD-10-CM | POA: Diagnosis not present

## 2023-02-25 DIAGNOSIS — Z741 Need for assistance with personal care: Secondary | ICD-10-CM | POA: Diagnosis not present

## 2023-02-25 DIAGNOSIS — M6281 Muscle weakness (generalized): Secondary | ICD-10-CM | POA: Diagnosis not present

## 2023-02-26 DIAGNOSIS — Z888 Allergy status to other drugs, medicaments and biological substances status: Secondary | ICD-10-CM | POA: Diagnosis not present

## 2023-02-26 DIAGNOSIS — R918 Other nonspecific abnormal finding of lung field: Secondary | ICD-10-CM | POA: Diagnosis not present

## 2023-02-26 DIAGNOSIS — Z880 Allergy status to penicillin: Secondary | ICD-10-CM | POA: Diagnosis not present

## 2023-02-26 DIAGNOSIS — S0990XA Unspecified injury of head, initial encounter: Secondary | ICD-10-CM | POA: Diagnosis not present

## 2023-02-26 DIAGNOSIS — S81802A Unspecified open wound, left lower leg, initial encounter: Secondary | ICD-10-CM | POA: Diagnosis not present

## 2023-02-26 DIAGNOSIS — S199XXA Unspecified injury of neck, initial encounter: Secondary | ICD-10-CM | POA: Diagnosis not present

## 2023-02-26 DIAGNOSIS — R4182 Altered mental status, unspecified: Secondary | ICD-10-CM | POA: Diagnosis not present

## 2023-02-26 DIAGNOSIS — W19XXXA Unspecified fall, initial encounter: Secondary | ICD-10-CM | POA: Diagnosis not present

## 2023-02-26 DIAGNOSIS — G8929 Other chronic pain: Secondary | ICD-10-CM | POA: Diagnosis not present

## 2023-02-26 DIAGNOSIS — R531 Weakness: Secondary | ICD-10-CM | POA: Diagnosis not present

## 2023-02-26 DIAGNOSIS — Z881 Allergy status to other antibiotic agents status: Secondary | ICD-10-CM | POA: Diagnosis not present

## 2023-02-26 DIAGNOSIS — M6281 Muscle weakness (generalized): Secondary | ICD-10-CM | POA: Diagnosis not present

## 2023-02-26 DIAGNOSIS — Z885 Allergy status to narcotic agent status: Secondary | ICD-10-CM | POA: Diagnosis not present

## 2023-02-26 DIAGNOSIS — R442 Other hallucinations: Secondary | ICD-10-CM | POA: Diagnosis not present

## 2023-02-26 DIAGNOSIS — Z7401 Bed confinement status: Secondary | ICD-10-CM | POA: Diagnosis not present

## 2023-02-26 DIAGNOSIS — I10 Essential (primary) hypertension: Secondary | ICD-10-CM | POA: Diagnosis not present

## 2023-02-26 DIAGNOSIS — Z741 Need for assistance with personal care: Secondary | ICD-10-CM | POA: Diagnosis not present

## 2023-02-26 DIAGNOSIS — R404 Transient alteration of awareness: Secondary | ICD-10-CM | POA: Diagnosis not present

## 2023-02-26 DIAGNOSIS — A419 Sepsis, unspecified organism: Secondary | ICD-10-CM | POA: Diagnosis not present

## 2023-02-27 DIAGNOSIS — M6281 Muscle weakness (generalized): Secondary | ICD-10-CM | POA: Diagnosis not present

## 2023-02-27 DIAGNOSIS — Z741 Need for assistance with personal care: Secondary | ICD-10-CM | POA: Diagnosis not present

## 2023-03-01 DIAGNOSIS — Z741 Need for assistance with personal care: Secondary | ICD-10-CM | POA: Diagnosis not present

## 2023-03-01 DIAGNOSIS — R5381 Other malaise: Secondary | ICD-10-CM | POA: Diagnosis not present

## 2023-03-01 DIAGNOSIS — M6281 Muscle weakness (generalized): Secondary | ICD-10-CM | POA: Diagnosis not present

## 2023-03-01 DIAGNOSIS — I509 Heart failure, unspecified: Secondary | ICD-10-CM | POA: Diagnosis not present

## 2023-03-01 DIAGNOSIS — K922 Gastrointestinal hemorrhage, unspecified: Secondary | ICD-10-CM | POA: Diagnosis not present

## 2023-03-01 DIAGNOSIS — J189 Pneumonia, unspecified organism: Secondary | ICD-10-CM | POA: Diagnosis not present

## 2023-03-02 DIAGNOSIS — J9621 Acute and chronic respiratory failure with hypoxia: Secondary | ICD-10-CM | POA: Diagnosis not present

## 2023-03-02 DIAGNOSIS — R6521 Severe sepsis with septic shock: Secondary | ICD-10-CM | POA: Diagnosis not present

## 2023-03-02 DIAGNOSIS — G9340 Encephalopathy, unspecified: Secondary | ICD-10-CM | POA: Diagnosis not present

## 2023-03-02 DIAGNOSIS — N390 Urinary tract infection, site not specified: Secondary | ICD-10-CM | POA: Diagnosis not present

## 2023-03-02 DIAGNOSIS — I10 Essential (primary) hypertension: Secondary | ICD-10-CM | POA: Diagnosis not present

## 2023-03-02 DIAGNOSIS — J9600 Acute respiratory failure, unspecified whether with hypoxia or hypercapnia: Secondary | ICD-10-CM | POA: Diagnosis not present

## 2023-03-02 DIAGNOSIS — R001 Bradycardia, unspecified: Secondary | ICD-10-CM | POA: Diagnosis not present

## 2023-03-02 DIAGNOSIS — I4891 Unspecified atrial fibrillation: Secondary | ICD-10-CM | POA: Diagnosis not present

## 2023-03-02 DIAGNOSIS — Z9911 Dependence on respirator [ventilator] status: Secondary | ICD-10-CM | POA: Diagnosis not present

## 2023-03-02 DIAGNOSIS — J189 Pneumonia, unspecified organism: Secondary | ICD-10-CM | POA: Diagnosis not present

## 2023-03-02 DIAGNOSIS — I5023 Acute on chronic systolic (congestive) heart failure: Secondary | ICD-10-CM | POA: Diagnosis not present

## 2023-03-02 DIAGNOSIS — A419 Sepsis, unspecified organism: Secondary | ICD-10-CM | POA: Diagnosis not present

## 2023-03-02 DIAGNOSIS — A4151 Sepsis due to Escherichia coli [E. coli]: Secondary | ICD-10-CM | POA: Diagnosis not present

## 2023-03-02 DIAGNOSIS — T68XXXA Hypothermia, initial encounter: Secondary | ICD-10-CM | POA: Diagnosis not present

## 2023-03-02 DIAGNOSIS — J984 Other disorders of lung: Secondary | ICD-10-CM | POA: Diagnosis not present

## 2023-03-02 DIAGNOSIS — Z4682 Encounter for fitting and adjustment of non-vascular catheter: Secondary | ICD-10-CM | POA: Diagnosis not present

## 2023-03-02 DIAGNOSIS — E870 Hyperosmolality and hypernatremia: Secondary | ICD-10-CM | POA: Diagnosis not present

## 2023-03-02 DIAGNOSIS — R0989 Other specified symptoms and signs involving the circulatory and respiratory systems: Secondary | ICD-10-CM | POA: Diagnosis not present

## 2023-03-02 DIAGNOSIS — J690 Pneumonitis due to inhalation of food and vomit: Secondary | ICD-10-CM | POA: Diagnosis not present

## 2023-03-02 DIAGNOSIS — R609 Edema, unspecified: Secondary | ICD-10-CM | POA: Diagnosis not present

## 2023-03-02 DIAGNOSIS — I959 Hypotension, unspecified: Secondary | ICD-10-CM | POA: Diagnosis not present

## 2023-03-02 DIAGNOSIS — G9341 Metabolic encephalopathy: Secondary | ICD-10-CM | POA: Diagnosis not present

## 2023-03-02 DIAGNOSIS — R4182 Altered mental status, unspecified: Secondary | ICD-10-CM | POA: Diagnosis not present

## 2023-03-02 DIAGNOSIS — I110 Hypertensive heart disease with heart failure: Secondary | ICD-10-CM | POA: Diagnosis not present

## 2023-03-02 DIAGNOSIS — R404 Transient alteration of awareness: Secondary | ICD-10-CM | POA: Diagnosis not present

## 2023-03-02 DIAGNOSIS — R0689 Other abnormalities of breathing: Secondary | ICD-10-CM | POA: Diagnosis not present

## 2023-03-02 DIAGNOSIS — Z7401 Bed confinement status: Secondary | ICD-10-CM | POA: Diagnosis not present

## 2023-03-02 DIAGNOSIS — J811 Chronic pulmonary edema: Secondary | ICD-10-CM | POA: Diagnosis not present

## 2023-03-02 DIAGNOSIS — R680 Hypothermia, not associated with low environmental temperature: Secondary | ICD-10-CM | POA: Diagnosis not present

## 2023-03-02 DIAGNOSIS — R918 Other nonspecific abnormal finding of lung field: Secondary | ICD-10-CM | POA: Diagnosis not present

## 2023-03-02 DIAGNOSIS — R410 Disorientation, unspecified: Secondary | ICD-10-CM | POA: Diagnosis not present

## 2023-03-03 DIAGNOSIS — J690 Pneumonitis due to inhalation of food and vomit: Secondary | ICD-10-CM | POA: Diagnosis not present

## 2023-03-03 DIAGNOSIS — R6521 Severe sepsis with septic shock: Secondary | ICD-10-CM | POA: Diagnosis not present

## 2023-03-03 DIAGNOSIS — R918 Other nonspecific abnormal finding of lung field: Secondary | ICD-10-CM | POA: Diagnosis not present

## 2023-03-03 DIAGNOSIS — N390 Urinary tract infection, site not specified: Secondary | ICD-10-CM | POA: Diagnosis not present

## 2023-03-03 DIAGNOSIS — G9341 Metabolic encephalopathy: Secondary | ICD-10-CM | POA: Diagnosis not present

## 2023-03-03 DIAGNOSIS — J189 Pneumonia, unspecified organism: Secondary | ICD-10-CM | POA: Diagnosis not present

## 2023-03-03 DIAGNOSIS — I4891 Unspecified atrial fibrillation: Secondary | ICD-10-CM | POA: Diagnosis not present

## 2023-03-03 DIAGNOSIS — J811 Chronic pulmonary edema: Secondary | ICD-10-CM | POA: Diagnosis not present

## 2023-03-04 DIAGNOSIS — Z9911 Dependence on respirator [ventilator] status: Secondary | ICD-10-CM | POA: Diagnosis not present

## 2023-03-04 DIAGNOSIS — J9600 Acute respiratory failure, unspecified whether with hypoxia or hypercapnia: Secondary | ICD-10-CM | POA: Diagnosis not present

## 2023-03-04 DIAGNOSIS — N390 Urinary tract infection, site not specified: Secondary | ICD-10-CM | POA: Diagnosis not present

## 2023-03-04 DIAGNOSIS — I4891 Unspecified atrial fibrillation: Secondary | ICD-10-CM | POA: Diagnosis not present

## 2023-03-04 DIAGNOSIS — A419 Sepsis, unspecified organism: Secondary | ICD-10-CM | POA: Diagnosis not present

## 2023-03-04 DIAGNOSIS — G9340 Encephalopathy, unspecified: Secondary | ICD-10-CM | POA: Diagnosis not present

## 2023-03-04 DIAGNOSIS — J189 Pneumonia, unspecified organism: Secondary | ICD-10-CM | POA: Diagnosis not present

## 2023-03-04 DIAGNOSIS — G9341 Metabolic encephalopathy: Secondary | ICD-10-CM | POA: Diagnosis not present

## 2023-03-04 DIAGNOSIS — R6521 Severe sepsis with septic shock: Secondary | ICD-10-CM | POA: Diagnosis not present

## 2023-03-04 DIAGNOSIS — J690 Pneumonitis due to inhalation of food and vomit: Secondary | ICD-10-CM | POA: Diagnosis not present

## 2023-03-04 DIAGNOSIS — J984 Other disorders of lung: Secondary | ICD-10-CM | POA: Diagnosis not present

## 2023-03-05 DIAGNOSIS — I4891 Unspecified atrial fibrillation: Secondary | ICD-10-CM | POA: Diagnosis not present

## 2023-03-05 DIAGNOSIS — N390 Urinary tract infection, site not specified: Secondary | ICD-10-CM | POA: Diagnosis not present

## 2023-03-05 DIAGNOSIS — G9340 Encephalopathy, unspecified: Secondary | ICD-10-CM | POA: Diagnosis not present

## 2023-03-05 DIAGNOSIS — R6521 Severe sepsis with septic shock: Secondary | ICD-10-CM | POA: Diagnosis not present

## 2023-03-05 DIAGNOSIS — A419 Sepsis, unspecified organism: Secondary | ICD-10-CM | POA: Diagnosis not present

## 2023-03-05 DIAGNOSIS — J9600 Acute respiratory failure, unspecified whether with hypoxia or hypercapnia: Secondary | ICD-10-CM | POA: Diagnosis not present

## 2023-03-05 DIAGNOSIS — G9341 Metabolic encephalopathy: Secondary | ICD-10-CM | POA: Diagnosis not present

## 2023-03-05 DIAGNOSIS — J690 Pneumonitis due to inhalation of food and vomit: Secondary | ICD-10-CM | POA: Diagnosis not present

## 2023-03-05 DIAGNOSIS — J984 Other disorders of lung: Secondary | ICD-10-CM | POA: Diagnosis not present

## 2023-03-05 DIAGNOSIS — Z9911 Dependence on respirator [ventilator] status: Secondary | ICD-10-CM | POA: Diagnosis not present

## 2023-03-05 DIAGNOSIS — J189 Pneumonia, unspecified organism: Secondary | ICD-10-CM | POA: Diagnosis not present

## 2023-03-06 DIAGNOSIS — J690 Pneumonitis due to inhalation of food and vomit: Secondary | ICD-10-CM | POA: Diagnosis not present

## 2023-03-06 DIAGNOSIS — A419 Sepsis, unspecified organism: Secondary | ICD-10-CM | POA: Diagnosis not present

## 2023-03-06 DIAGNOSIS — G9340 Encephalopathy, unspecified: Secondary | ICD-10-CM | POA: Diagnosis not present

## 2023-03-06 DIAGNOSIS — J9600 Acute respiratory failure, unspecified whether with hypoxia or hypercapnia: Secondary | ICD-10-CM | POA: Diagnosis not present

## 2023-03-06 DIAGNOSIS — R6521 Severe sepsis with septic shock: Secondary | ICD-10-CM | POA: Diagnosis not present

## 2023-03-06 DIAGNOSIS — I4891 Unspecified atrial fibrillation: Secondary | ICD-10-CM | POA: Diagnosis not present

## 2023-03-06 DIAGNOSIS — J189 Pneumonia, unspecified organism: Secondary | ICD-10-CM | POA: Diagnosis not present

## 2023-03-06 DIAGNOSIS — G9341 Metabolic encephalopathy: Secondary | ICD-10-CM | POA: Diagnosis not present

## 2023-03-06 DIAGNOSIS — N390 Urinary tract infection, site not specified: Secondary | ICD-10-CM | POA: Diagnosis not present

## 2023-03-07 DIAGNOSIS — A419 Sepsis, unspecified organism: Secondary | ICD-10-CM | POA: Diagnosis not present

## 2023-03-07 DIAGNOSIS — G9341 Metabolic encephalopathy: Secondary | ICD-10-CM | POA: Diagnosis not present

## 2023-03-07 DIAGNOSIS — J9600 Acute respiratory failure, unspecified whether with hypoxia or hypercapnia: Secondary | ICD-10-CM | POA: Diagnosis not present

## 2023-03-07 DIAGNOSIS — J189 Pneumonia, unspecified organism: Secondary | ICD-10-CM | POA: Diagnosis not present

## 2023-03-07 DIAGNOSIS — I4891 Unspecified atrial fibrillation: Secondary | ICD-10-CM | POA: Diagnosis not present

## 2023-03-07 DIAGNOSIS — G9340 Encephalopathy, unspecified: Secondary | ICD-10-CM | POA: Diagnosis not present

## 2023-03-07 DIAGNOSIS — R6521 Severe sepsis with septic shock: Secondary | ICD-10-CM | POA: Diagnosis not present

## 2023-03-07 DIAGNOSIS — N390 Urinary tract infection, site not specified: Secondary | ICD-10-CM | POA: Diagnosis not present

## 2023-03-07 DIAGNOSIS — J690 Pneumonitis due to inhalation of food and vomit: Secondary | ICD-10-CM | POA: Diagnosis not present

## 2023-03-08 DIAGNOSIS — R918 Other nonspecific abnormal finding of lung field: Secondary | ICD-10-CM | POA: Diagnosis not present

## 2023-03-08 DIAGNOSIS — R6521 Severe sepsis with septic shock: Secondary | ICD-10-CM | POA: Diagnosis not present

## 2023-03-08 DIAGNOSIS — J9600 Acute respiratory failure, unspecified whether with hypoxia or hypercapnia: Secondary | ICD-10-CM | POA: Diagnosis not present

## 2023-03-08 DIAGNOSIS — N390 Urinary tract infection, site not specified: Secondary | ICD-10-CM | POA: Diagnosis not present

## 2023-03-08 DIAGNOSIS — J189 Pneumonia, unspecified organism: Secondary | ICD-10-CM | POA: Diagnosis not present

## 2023-03-08 DIAGNOSIS — R0989 Other specified symptoms and signs involving the circulatory and respiratory systems: Secondary | ICD-10-CM | POA: Diagnosis not present

## 2023-03-08 DIAGNOSIS — I4891 Unspecified atrial fibrillation: Secondary | ICD-10-CM | POA: Diagnosis not present

## 2023-03-08 DIAGNOSIS — J690 Pneumonitis due to inhalation of food and vomit: Secondary | ICD-10-CM | POA: Diagnosis not present

## 2023-03-08 DIAGNOSIS — A419 Sepsis, unspecified organism: Secondary | ICD-10-CM | POA: Diagnosis not present

## 2023-03-08 DIAGNOSIS — G9340 Encephalopathy, unspecified: Secondary | ICD-10-CM | POA: Diagnosis not present

## 2023-03-08 DIAGNOSIS — G9341 Metabolic encephalopathy: Secondary | ICD-10-CM | POA: Diagnosis not present

## 2023-03-08 DIAGNOSIS — Z4682 Encounter for fitting and adjustment of non-vascular catheter: Secondary | ICD-10-CM | POA: Diagnosis not present

## 2023-03-09 DIAGNOSIS — J9600 Acute respiratory failure, unspecified whether with hypoxia or hypercapnia: Secondary | ICD-10-CM | POA: Diagnosis not present

## 2023-03-09 DIAGNOSIS — J690 Pneumonitis due to inhalation of food and vomit: Secondary | ICD-10-CM | POA: Diagnosis not present

## 2023-03-09 DIAGNOSIS — R6521 Severe sepsis with septic shock: Secondary | ICD-10-CM | POA: Diagnosis not present

## 2023-03-09 DIAGNOSIS — A419 Sepsis, unspecified organism: Secondary | ICD-10-CM | POA: Diagnosis not present

## 2023-03-09 DIAGNOSIS — I4891 Unspecified atrial fibrillation: Secondary | ICD-10-CM | POA: Diagnosis not present

## 2023-03-09 DIAGNOSIS — J189 Pneumonia, unspecified organism: Secondary | ICD-10-CM | POA: Diagnosis not present

## 2023-03-09 DIAGNOSIS — G9340 Encephalopathy, unspecified: Secondary | ICD-10-CM | POA: Diagnosis not present

## 2023-03-09 DIAGNOSIS — G9341 Metabolic encephalopathy: Secondary | ICD-10-CM | POA: Diagnosis not present

## 2023-03-09 DIAGNOSIS — N390 Urinary tract infection, site not specified: Secondary | ICD-10-CM | POA: Diagnosis not present

## 2023-03-10 ENCOUNTER — Other Ambulatory Visit: Payer: Self-pay

## 2023-03-10 DIAGNOSIS — A4151 Sepsis due to Escherichia coli [E. coli]: Secondary | ICD-10-CM | POA: Diagnosis not present

## 2023-03-10 DIAGNOSIS — G9341 Metabolic encephalopathy: Secondary | ICD-10-CM | POA: Diagnosis not present

## 2023-03-10 DIAGNOSIS — A419 Sepsis, unspecified organism: Secondary | ICD-10-CM | POA: Diagnosis not present

## 2023-03-10 DIAGNOSIS — D649 Anemia, unspecified: Secondary | ICD-10-CM

## 2023-03-10 DIAGNOSIS — G9340 Encephalopathy, unspecified: Secondary | ICD-10-CM | POA: Diagnosis not present

## 2023-03-10 DIAGNOSIS — N390 Urinary tract infection, site not specified: Secondary | ICD-10-CM | POA: Diagnosis not present

## 2023-03-10 DIAGNOSIS — I82532 Chronic embolism and thrombosis of left popliteal vein: Secondary | ICD-10-CM

## 2023-03-10 DIAGNOSIS — J9600 Acute respiratory failure, unspecified whether with hypoxia or hypercapnia: Secondary | ICD-10-CM | POA: Diagnosis not present

## 2023-03-10 DIAGNOSIS — E559 Vitamin D deficiency, unspecified: Secondary | ICD-10-CM

## 2023-03-11 DIAGNOSIS — G9341 Metabolic encephalopathy: Secondary | ICD-10-CM | POA: Diagnosis not present

## 2023-03-11 DIAGNOSIS — J9600 Acute respiratory failure, unspecified whether with hypoxia or hypercapnia: Secondary | ICD-10-CM | POA: Diagnosis not present

## 2023-03-11 DIAGNOSIS — N390 Urinary tract infection, site not specified: Secondary | ICD-10-CM | POA: Diagnosis not present

## 2023-03-11 DIAGNOSIS — G9340 Encephalopathy, unspecified: Secondary | ICD-10-CM | POA: Diagnosis not present

## 2023-03-11 DIAGNOSIS — A419 Sepsis, unspecified organism: Secondary | ICD-10-CM | POA: Diagnosis not present

## 2023-03-11 DIAGNOSIS — A4151 Sepsis due to Escherichia coli [E. coli]: Secondary | ICD-10-CM | POA: Diagnosis not present

## 2023-03-12 DIAGNOSIS — A4151 Sepsis due to Escherichia coli [E. coli]: Secondary | ICD-10-CM | POA: Diagnosis not present

## 2023-03-12 DIAGNOSIS — N390 Urinary tract infection, site not specified: Secondary | ICD-10-CM | POA: Diagnosis not present

## 2023-03-12 DIAGNOSIS — A419 Sepsis, unspecified organism: Secondary | ICD-10-CM | POA: Diagnosis not present

## 2023-03-12 DIAGNOSIS — G9340 Encephalopathy, unspecified: Secondary | ICD-10-CM | POA: Diagnosis not present

## 2023-03-12 DIAGNOSIS — G9341 Metabolic encephalopathy: Secondary | ICD-10-CM | POA: Diagnosis not present

## 2023-03-12 DIAGNOSIS — J9600 Acute respiratory failure, unspecified whether with hypoxia or hypercapnia: Secondary | ICD-10-CM | POA: Diagnosis not present

## 2023-03-13 DIAGNOSIS — N390 Urinary tract infection, site not specified: Secondary | ICD-10-CM | POA: Diagnosis not present

## 2023-03-13 DIAGNOSIS — A419 Sepsis, unspecified organism: Secondary | ICD-10-CM | POA: Diagnosis not present

## 2023-03-13 DIAGNOSIS — J9600 Acute respiratory failure, unspecified whether with hypoxia or hypercapnia: Secondary | ICD-10-CM | POA: Diagnosis not present

## 2023-03-13 DIAGNOSIS — R0689 Other abnormalities of breathing: Secondary | ICD-10-CM | POA: Diagnosis not present

## 2023-03-13 DIAGNOSIS — R410 Disorientation, unspecified: Secondary | ICD-10-CM | POA: Diagnosis not present

## 2023-03-13 DIAGNOSIS — I10 Essential (primary) hypertension: Secondary | ICD-10-CM | POA: Diagnosis not present

## 2023-03-13 DIAGNOSIS — G9340 Encephalopathy, unspecified: Secondary | ICD-10-CM | POA: Diagnosis not present

## 2023-03-13 DIAGNOSIS — Z7401 Bed confinement status: Secondary | ICD-10-CM | POA: Diagnosis not present

## 2023-03-14 DIAGNOSIS — R41841 Cognitive communication deficit: Secondary | ICD-10-CM | POA: Diagnosis not present

## 2023-03-14 DIAGNOSIS — M6281 Muscle weakness (generalized): Secondary | ICD-10-CM | POA: Diagnosis not present

## 2023-03-14 DIAGNOSIS — R1312 Dysphagia, oropharyngeal phase: Secondary | ICD-10-CM | POA: Diagnosis not present

## 2023-03-15 DIAGNOSIS — R1312 Dysphagia, oropharyngeal phase: Secondary | ICD-10-CM | POA: Diagnosis not present

## 2023-03-15 DIAGNOSIS — F3189 Other bipolar disorder: Secondary | ICD-10-CM | POA: Diagnosis not present

## 2023-03-15 DIAGNOSIS — R41841 Cognitive communication deficit: Secondary | ICD-10-CM | POA: Diagnosis not present

## 2023-03-15 DIAGNOSIS — M6281 Muscle weakness (generalized): Secondary | ICD-10-CM | POA: Diagnosis not present

## 2023-03-15 DIAGNOSIS — F411 Generalized anxiety disorder: Secondary | ICD-10-CM | POA: Diagnosis not present

## 2023-03-16 DIAGNOSIS — D649 Anemia, unspecified: Secondary | ICD-10-CM | POA: Diagnosis not present

## 2023-03-16 DIAGNOSIS — R5381 Other malaise: Secondary | ICD-10-CM | POA: Diagnosis not present

## 2023-03-16 DIAGNOSIS — R41841 Cognitive communication deficit: Secondary | ICD-10-CM | POA: Diagnosis not present

## 2023-03-16 DIAGNOSIS — K7581 Nonalcoholic steatohepatitis (NASH): Secondary | ICD-10-CM | POA: Diagnosis not present

## 2023-03-16 DIAGNOSIS — G9341 Metabolic encephalopathy: Secondary | ICD-10-CM | POA: Diagnosis not present

## 2023-03-16 DIAGNOSIS — I959 Hypotension, unspecified: Secondary | ICD-10-CM | POA: Diagnosis not present

## 2023-03-16 DIAGNOSIS — R1312 Dysphagia, oropharyngeal phase: Secondary | ICD-10-CM | POA: Diagnosis not present

## 2023-03-16 DIAGNOSIS — I509 Heart failure, unspecified: Secondary | ICD-10-CM | POA: Diagnosis not present

## 2023-03-16 DIAGNOSIS — M6281 Muscle weakness (generalized): Secondary | ICD-10-CM | POA: Diagnosis not present

## 2023-03-17 DIAGNOSIS — R41841 Cognitive communication deficit: Secondary | ICD-10-CM | POA: Diagnosis not present

## 2023-03-17 DIAGNOSIS — R1312 Dysphagia, oropharyngeal phase: Secondary | ICD-10-CM | POA: Diagnosis not present

## 2023-03-17 DIAGNOSIS — M6281 Muscle weakness (generalized): Secondary | ICD-10-CM | POA: Diagnosis not present

## 2023-03-20 DIAGNOSIS — R41841 Cognitive communication deficit: Secondary | ICD-10-CM | POA: Diagnosis not present

## 2023-03-20 DIAGNOSIS — R1312 Dysphagia, oropharyngeal phase: Secondary | ICD-10-CM | POA: Diagnosis not present

## 2023-03-20 DIAGNOSIS — M6281 Muscle weakness (generalized): Secondary | ICD-10-CM | POA: Diagnosis not present

## 2023-03-22 DIAGNOSIS — R41841 Cognitive communication deficit: Secondary | ICD-10-CM | POA: Diagnosis not present

## 2023-03-22 DIAGNOSIS — M6281 Muscle weakness (generalized): Secondary | ICD-10-CM | POA: Diagnosis not present

## 2023-03-22 DIAGNOSIS — R1312 Dysphagia, oropharyngeal phase: Secondary | ICD-10-CM | POA: Diagnosis not present

## 2023-03-23 DIAGNOSIS — R41841 Cognitive communication deficit: Secondary | ICD-10-CM | POA: Diagnosis not present

## 2023-03-23 DIAGNOSIS — R1312 Dysphagia, oropharyngeal phase: Secondary | ICD-10-CM | POA: Diagnosis not present

## 2023-03-23 DIAGNOSIS — M6281 Muscle weakness (generalized): Secondary | ICD-10-CM | POA: Diagnosis not present

## 2023-04-24 DEATH — deceased
